# Patient Record
Sex: Female | Born: 1960
Health system: Southern US, Community
[De-identification: ages and names within clinical notes are randomized; demographics above are authoritative.]

## PROBLEM LIST (undated history)

## (undated) DIAGNOSIS — IMO0002 Reserved for concepts with insufficient information to code with codable children: Secondary | ICD-10-CM

## (undated) DIAGNOSIS — K5090 Crohn's disease, unspecified, without complications: Secondary | ICD-10-CM

## (undated) DIAGNOSIS — M1990 Unspecified osteoarthritis, unspecified site: Secondary | ICD-10-CM

## (undated) DIAGNOSIS — I10 Essential (primary) hypertension: Secondary | ICD-10-CM

## (undated) DIAGNOSIS — G4730 Sleep apnea, unspecified: Secondary | ICD-10-CM

## (undated) DIAGNOSIS — G5600 Carpal tunnel syndrome, unspecified upper limb: Secondary | ICD-10-CM

## (undated) DIAGNOSIS — E785 Hyperlipidemia, unspecified: Secondary | ICD-10-CM

## (undated) DIAGNOSIS — K219 Gastro-esophageal reflux disease without esophagitis: Secondary | ICD-10-CM

## (undated) DIAGNOSIS — F419 Anxiety disorder, unspecified: Secondary | ICD-10-CM

## (undated) DIAGNOSIS — H5440 Blindness, one eye, unspecified eye: Secondary | ICD-10-CM

## (undated) DIAGNOSIS — M797 Fibromyalgia: Secondary | ICD-10-CM

## (undated) DIAGNOSIS — G7000 Myasthenia gravis without (acute) exacerbation: Secondary | ICD-10-CM

## (undated) DIAGNOSIS — K589 Irritable bowel syndrome without diarrhea: Secondary | ICD-10-CM

## (undated) DIAGNOSIS — J189 Pneumonia, unspecified organism: Secondary | ICD-10-CM

## (undated) DIAGNOSIS — D72829 Elevated white blood cell count, unspecified: Secondary | ICD-10-CM

## (undated) DIAGNOSIS — R011 Cardiac murmur, unspecified: Secondary | ICD-10-CM

## (undated) DIAGNOSIS — Z9689 Presence of other specified functional implants: Secondary | ICD-10-CM

## (undated) DIAGNOSIS — C801 Malignant (primary) neoplasm, unspecified: Secondary | ICD-10-CM

## (undated) HISTORY — DX: Elevated white blood cell count, unspecified: D72.829

## (undated) HISTORY — PX: CARPAL TUNNEL RELEASE: SHX101

## (undated) HISTORY — DX: Reserved for concepts with insufficient information to code with codable children: IMO0002

## (undated) HISTORY — DX: Fibromyalgia: M79.7

## (undated) HISTORY — PX: THYMECTOMY: SHX1063

## (undated) HISTORY — DX: Malignant (primary) neoplasm, unspecified: C80.1

## (undated) HISTORY — PX: CHOLECYSTECTOMY: SHX55

## (undated) HISTORY — PX: BACK SURGERY: SHX140

## (undated) HISTORY — PX: MELANOMA EXCISION: SHX5266

## (undated) HISTORY — DX: Carpal tunnel syndrome, unspecified upper limb: G56.00

## (undated) HISTORY — DX: Essential (primary) hypertension: I10

## (undated) HISTORY — PX: BREAST REDUCTION SURGERY: SHX8

## (undated) HISTORY — PX: NASAL SINUS SURGERY: SHX719

## (undated) HISTORY — PX: ENDOMETRIAL ABLATION W/ NOVASURE: SUR434

## (undated) HISTORY — DX: Hyperlipidemia, unspecified: E78.5

## (undated) HISTORY — PX: OTHER SURGICAL HISTORY: SHX169

---

## 1998-01-29 ENCOUNTER — Encounter: Admission: RE | Admit: 1998-01-29 | Discharge: 1998-04-29 | Payer: Self-pay | Admitting: Internal Medicine

## 1999-04-18 ENCOUNTER — Encounter (INDEPENDENT_AMBULATORY_CARE_PROVIDER_SITE_OTHER): Payer: Self-pay

## 1999-04-18 ENCOUNTER — Other Ambulatory Visit: Admission: RE | Admit: 1999-04-18 | Discharge: 1999-04-18 | Payer: Self-pay | Admitting: Radiology

## 1999-04-26 ENCOUNTER — Other Ambulatory Visit: Admission: RE | Admit: 1999-04-26 | Discharge: 1999-04-26 | Payer: Self-pay | Admitting: Gynecology

## 1999-07-07 ENCOUNTER — Encounter: Payer: Self-pay | Admitting: Internal Medicine

## 1999-07-07 ENCOUNTER — Encounter: Admission: RE | Admit: 1999-07-07 | Discharge: 1999-07-07 | Payer: Self-pay | Admitting: Internal Medicine

## 1999-09-10 ENCOUNTER — Encounter: Payer: Self-pay | Admitting: Emergency Medicine

## 1999-09-10 ENCOUNTER — Encounter (INDEPENDENT_AMBULATORY_CARE_PROVIDER_SITE_OTHER): Payer: Self-pay | Admitting: *Deleted

## 1999-09-11 ENCOUNTER — Encounter: Payer: Self-pay | Admitting: Emergency Medicine

## 1999-09-11 ENCOUNTER — Inpatient Hospital Stay (HOSPITAL_COMMUNITY): Admission: EM | Admit: 1999-09-11 | Discharge: 1999-09-14 | Payer: Self-pay | Admitting: Emergency Medicine

## 1999-09-13 ENCOUNTER — Encounter: Payer: Self-pay | Admitting: Internal Medicine

## 1999-11-30 ENCOUNTER — Ambulatory Visit (HOSPITAL_COMMUNITY): Admission: RE | Admit: 1999-11-30 | Discharge: 1999-11-30 | Payer: Self-pay | Admitting: Gastroenterology

## 2000-05-31 ENCOUNTER — Encounter (INDEPENDENT_AMBULATORY_CARE_PROVIDER_SITE_OTHER): Payer: Self-pay | Admitting: Specialist

## 2000-05-31 ENCOUNTER — Other Ambulatory Visit: Admission: RE | Admit: 2000-05-31 | Discharge: 2000-05-31 | Payer: Self-pay | Admitting: Plastic Surgery

## 2000-06-07 ENCOUNTER — Other Ambulatory Visit: Admission: RE | Admit: 2000-06-07 | Discharge: 2000-06-07 | Payer: Self-pay | Admitting: Gynecology

## 2000-08-17 ENCOUNTER — Inpatient Hospital Stay (HOSPITAL_COMMUNITY): Admission: RE | Admit: 2000-08-17 | Discharge: 2000-08-20 | Payer: Self-pay | Admitting: Internal Medicine

## 2000-08-17 ENCOUNTER — Encounter: Payer: Self-pay | Admitting: Internal Medicine

## 2000-08-18 ENCOUNTER — Encounter: Payer: Self-pay | Admitting: Internal Medicine

## 2000-08-18 ENCOUNTER — Encounter: Payer: Self-pay | Admitting: Geriatric Medicine

## 2000-09-07 ENCOUNTER — Encounter (INDEPENDENT_AMBULATORY_CARE_PROVIDER_SITE_OTHER): Payer: Self-pay | Admitting: *Deleted

## 2000-09-07 ENCOUNTER — Ambulatory Visit (HOSPITAL_COMMUNITY): Admission: RE | Admit: 2000-09-07 | Discharge: 2000-09-07 | Payer: Self-pay | Admitting: Gastroenterology

## 2000-09-25 ENCOUNTER — Ambulatory Visit (HOSPITAL_COMMUNITY): Admission: RE | Admit: 2000-09-25 | Discharge: 2000-09-25 | Payer: Self-pay | Admitting: General Surgery

## 2000-09-25 ENCOUNTER — Encounter: Payer: Self-pay | Admitting: General Surgery

## 2000-10-09 ENCOUNTER — Ambulatory Visit (HOSPITAL_BASED_OUTPATIENT_CLINIC_OR_DEPARTMENT_OTHER): Admission: RE | Admit: 2000-10-09 | Discharge: 2000-10-10 | Payer: Self-pay | Admitting: Plastic Surgery

## 2000-10-09 ENCOUNTER — Encounter (INDEPENDENT_AMBULATORY_CARE_PROVIDER_SITE_OTHER): Payer: Self-pay | Admitting: Specialist

## 2000-10-26 ENCOUNTER — Encounter: Payer: Self-pay | Admitting: General Surgery

## 2000-11-06 ENCOUNTER — Ambulatory Visit (HOSPITAL_COMMUNITY): Admission: RE | Admit: 2000-11-06 | Discharge: 2000-11-06 | Payer: Self-pay | Admitting: General Surgery

## 2000-11-06 ENCOUNTER — Encounter (INDEPENDENT_AMBULATORY_CARE_PROVIDER_SITE_OTHER): Payer: Self-pay

## 2001-03-06 ENCOUNTER — Encounter: Payer: Self-pay | Admitting: Internal Medicine

## 2001-03-06 ENCOUNTER — Encounter: Admission: RE | Admit: 2001-03-06 | Discharge: 2001-03-06 | Payer: Self-pay | Admitting: Internal Medicine

## 2001-04-03 ENCOUNTER — Encounter: Payer: Self-pay | Admitting: Internal Medicine

## 2001-04-03 ENCOUNTER — Encounter: Admission: RE | Admit: 2001-04-03 | Discharge: 2001-04-03 | Payer: Self-pay | Admitting: Internal Medicine

## 2001-04-22 ENCOUNTER — Ambulatory Visit (HOSPITAL_COMMUNITY): Admission: RE | Admit: 2001-04-22 | Discharge: 2001-04-23 | Payer: Self-pay | Admitting: Otolaryngology

## 2001-04-22 ENCOUNTER — Encounter (INDEPENDENT_AMBULATORY_CARE_PROVIDER_SITE_OTHER): Payer: Self-pay | Admitting: *Deleted

## 2001-05-07 ENCOUNTER — Encounter (INDEPENDENT_AMBULATORY_CARE_PROVIDER_SITE_OTHER): Payer: Self-pay

## 2001-05-07 ENCOUNTER — Encounter: Payer: Self-pay | Admitting: Gastroenterology

## 2001-05-07 ENCOUNTER — Inpatient Hospital Stay (HOSPITAL_COMMUNITY): Admission: EM | Admit: 2001-05-07 | Discharge: 2001-05-13 | Payer: Self-pay

## 2001-05-08 ENCOUNTER — Encounter: Payer: Self-pay | Admitting: Internal Medicine

## 2001-05-08 ENCOUNTER — Encounter: Payer: Self-pay | Admitting: Gastroenterology

## 2001-06-05 ENCOUNTER — Other Ambulatory Visit: Admission: RE | Admit: 2001-06-05 | Discharge: 2001-06-05 | Payer: Self-pay | Admitting: Gynecology

## 2002-06-30 ENCOUNTER — Other Ambulatory Visit: Admission: RE | Admit: 2002-06-30 | Discharge: 2002-06-30 | Payer: Self-pay | Admitting: Gynecology

## 2002-10-14 ENCOUNTER — Encounter: Admission: RE | Admit: 2002-10-14 | Discharge: 2002-10-14 | Payer: Self-pay | Admitting: Internal Medicine

## 2002-10-14 ENCOUNTER — Encounter: Payer: Self-pay | Admitting: Internal Medicine

## 2002-11-21 ENCOUNTER — Encounter: Admission: RE | Admit: 2002-11-21 | Discharge: 2002-11-21 | Payer: Self-pay | Admitting: Otolaryngology

## 2002-11-21 ENCOUNTER — Encounter: Payer: Self-pay | Admitting: Otolaryngology

## 2002-11-25 ENCOUNTER — Encounter: Payer: Self-pay | Admitting: *Deleted

## 2002-11-25 ENCOUNTER — Inpatient Hospital Stay (HOSPITAL_COMMUNITY): Admission: AD | Admit: 2002-11-25 | Discharge: 2002-12-01 | Payer: Self-pay | Admitting: *Deleted

## 2002-11-26 ENCOUNTER — Encounter (INDEPENDENT_AMBULATORY_CARE_PROVIDER_SITE_OTHER): Payer: Self-pay | Admitting: *Deleted

## 2002-11-28 ENCOUNTER — Encounter (INDEPENDENT_AMBULATORY_CARE_PROVIDER_SITE_OTHER): Payer: Self-pay | Admitting: Specialist

## 2002-11-28 ENCOUNTER — Encounter: Payer: Self-pay | Admitting: *Deleted

## 2003-07-21 ENCOUNTER — Other Ambulatory Visit: Admission: RE | Admit: 2003-07-21 | Discharge: 2003-07-21 | Payer: Self-pay | Admitting: Gynecology

## 2003-08-04 ENCOUNTER — Ambulatory Visit (HOSPITAL_COMMUNITY): Admission: RE | Admit: 2003-08-04 | Discharge: 2003-08-04 | Payer: Self-pay | Admitting: General Surgery

## 2003-08-04 ENCOUNTER — Encounter (INDEPENDENT_AMBULATORY_CARE_PROVIDER_SITE_OTHER): Payer: Self-pay | Admitting: Specialist

## 2003-11-12 ENCOUNTER — Ambulatory Visit (HOSPITAL_COMMUNITY): Admission: RE | Admit: 2003-11-12 | Discharge: 2003-11-12 | Payer: Self-pay | Admitting: *Deleted

## 2004-03-02 ENCOUNTER — Ambulatory Visit: Payer: Self-pay | Admitting: Oncology

## 2004-05-18 ENCOUNTER — Encounter: Admission: RE | Admit: 2004-05-18 | Discharge: 2004-05-18 | Payer: Self-pay | Admitting: Internal Medicine

## 2004-06-13 ENCOUNTER — Ambulatory Visit: Payer: Self-pay | Admitting: Oncology

## 2004-07-22 ENCOUNTER — Ambulatory Visit: Payer: Self-pay | Admitting: Oncology

## 2004-07-22 ENCOUNTER — Ambulatory Visit (HOSPITAL_COMMUNITY): Admission: RE | Admit: 2004-07-22 | Discharge: 2004-07-22 | Payer: Self-pay | Admitting: Oncology

## 2004-07-22 ENCOUNTER — Encounter (INDEPENDENT_AMBULATORY_CARE_PROVIDER_SITE_OTHER): Payer: Self-pay | Admitting: Specialist

## 2004-08-31 ENCOUNTER — Other Ambulatory Visit: Admission: RE | Admit: 2004-08-31 | Discharge: 2004-08-31 | Payer: Self-pay | Admitting: Gynecology

## 2004-09-12 ENCOUNTER — Ambulatory Visit: Payer: Self-pay | Admitting: Oncology

## 2004-11-14 ENCOUNTER — Emergency Department (HOSPITAL_COMMUNITY): Admission: EM | Admit: 2004-11-14 | Discharge: 2004-11-14 | Payer: Self-pay | Admitting: Emergency Medicine

## 2005-03-31 ENCOUNTER — Ambulatory Visit: Payer: Self-pay | Admitting: Oncology

## 2005-07-19 ENCOUNTER — Ambulatory Visit: Payer: Self-pay | Admitting: Internal Medicine

## 2005-09-25 ENCOUNTER — Other Ambulatory Visit: Admission: RE | Admit: 2005-09-25 | Discharge: 2005-09-25 | Payer: Self-pay | Admitting: Gynecology

## 2005-10-04 ENCOUNTER — Ambulatory Visit: Payer: Self-pay | Admitting: Oncology

## 2005-10-09 LAB — COMPREHENSIVE METABOLIC PANEL
ALT: 13 U/L (ref 0–40)
AST: 12 U/L (ref 0–37)
Alkaline Phosphatase: 42 U/L (ref 39–117)
Sodium: 142 mEq/L (ref 135–145)
Total Bilirubin: 0.6 mg/dL (ref 0.3–1.2)
Total Protein: 7 g/dL (ref 6.0–8.3)

## 2005-10-09 LAB — CBC WITH DIFFERENTIAL/PLATELET
EOS%: 0.7 % (ref 0.0–7.0)
MCH: 33.4 pg (ref 26.0–34.0)
MCV: 94.1 fL (ref 81.0–101.0)
MONO%: 5.3 % (ref 0.0–13.0)
NEUT#: 13.2 10*3/uL — ABNORMAL HIGH (ref 1.5–6.5)
RBC: 4.36 10*6/uL (ref 3.70–5.32)
RDW: 14.3 % (ref 11.3–14.5)

## 2005-10-09 LAB — CHCC SMEAR

## 2005-10-19 ENCOUNTER — Ambulatory Visit: Payer: Self-pay | Admitting: Internal Medicine

## 2005-11-06 ENCOUNTER — Ambulatory Visit: Payer: Self-pay | Admitting: Internal Medicine

## 2005-11-27 ENCOUNTER — Ambulatory Visit (HOSPITAL_COMMUNITY): Admission: RE | Admit: 2005-11-27 | Discharge: 2005-11-27 | Payer: Self-pay | Admitting: Gastroenterology

## 2006-03-27 ENCOUNTER — Encounter: Admission: RE | Admit: 2006-03-27 | Discharge: 2006-03-27 | Payer: Self-pay | Admitting: Gastroenterology

## 2006-04-04 ENCOUNTER — Ambulatory Visit: Payer: Self-pay | Admitting: Oncology

## 2006-04-09 LAB — CBC WITH DIFFERENTIAL/PLATELET
Basophils Absolute: 0 10*3/uL (ref 0.0–0.1)
Eosinophils Absolute: 0.2 10*3/uL (ref 0.0–0.5)
HCT: 43.3 % (ref 34.8–46.6)
HGB: 14.5 g/dL (ref 11.6–15.9)
LYMPH%: 7.8 % — ABNORMAL LOW (ref 14.0–48.0)
MCV: 92.6 fL (ref 81.0–101.0)
MONO#: 0.7 10*3/uL (ref 0.1–0.9)
NEUT#: 12.2 10*3/uL — ABNORMAL HIGH (ref 1.5–6.5)
NEUT%: 85.7 % — ABNORMAL HIGH (ref 39.6–76.8)
Platelets: 313 10*3/uL (ref 145–400)
RBC: 4.67 10*6/uL (ref 3.70–5.32)
WBC: 14.2 10*3/uL — ABNORMAL HIGH (ref 3.9–10.0)

## 2006-04-09 LAB — COMPREHENSIVE METABOLIC PANEL
BUN: 17 mg/dL (ref 6–23)
CO2: 20 mEq/L (ref 19–32)
Glucose, Bld: 127 mg/dL — ABNORMAL HIGH (ref 70–99)
Sodium: 140 mEq/L (ref 135–145)
Total Bilirubin: 0.4 mg/dL (ref 0.3–1.2)
Total Protein: 7.1 g/dL (ref 6.0–8.3)

## 2006-04-09 LAB — LACTATE DEHYDROGENASE: LDH: 156 U/L (ref 94–250)

## 2006-10-04 ENCOUNTER — Ambulatory Visit: Payer: Self-pay | Admitting: Oncology

## 2006-10-10 LAB — CBC WITH DIFFERENTIAL/PLATELET
BASO%: 0.3 % (ref 0.0–2.0)
HCT: UNDETERMINED % (ref 34.8–46.6)
MCHC: UNDETERMINED g/dL (ref 32.0–36.0)
MONO#: 1 10*3/uL — ABNORMAL HIGH (ref 0.1–0.9)
NEUT#: 12.5 10*3/uL — ABNORMAL HIGH (ref 1.5–6.5)
RBC: UNDETERMINED 10*6/uL (ref 3.70–5.32)
WBC: 15.3 10*3/uL — ABNORMAL HIGH (ref 3.9–10.0)
lymph#: 1.6 10*3/uL (ref 0.9–3.3)

## 2006-10-15 ENCOUNTER — Other Ambulatory Visit: Admission: RE | Admit: 2006-10-15 | Discharge: 2006-10-15 | Payer: Self-pay | Admitting: Gynecology

## 2006-10-23 ENCOUNTER — Encounter: Admission: RE | Admit: 2006-10-23 | Discharge: 2006-10-23 | Payer: Self-pay | Admitting: Gastroenterology

## 2007-02-20 ENCOUNTER — Encounter: Admission: RE | Admit: 2007-02-20 | Discharge: 2007-02-20 | Payer: Self-pay | Admitting: Gastroenterology

## 2007-04-20 ENCOUNTER — Encounter: Payer: Self-pay | Admitting: Internal Medicine

## 2007-04-20 DIAGNOSIS — J45909 Unspecified asthma, uncomplicated: Secondary | ICD-10-CM | POA: Insufficient documentation

## 2007-04-20 DIAGNOSIS — I10 Essential (primary) hypertension: Secondary | ICD-10-CM | POA: Insufficient documentation

## 2007-04-20 DIAGNOSIS — E785 Hyperlipidemia, unspecified: Secondary | ICD-10-CM | POA: Insufficient documentation

## 2007-04-20 DIAGNOSIS — Z8669 Personal history of other diseases of the nervous system and sense organs: Secondary | ICD-10-CM | POA: Insufficient documentation

## 2007-08-26 ENCOUNTER — Ambulatory Visit: Payer: Self-pay | Admitting: Oncology

## 2007-08-29 LAB — CBC WITH DIFFERENTIAL/PLATELET
BASO%: 0.2 % (ref 0.0–2.0)
LYMPH%: 5.7 % — ABNORMAL LOW (ref 14.0–48.0)
MCHC: 36.5 g/dL — ABNORMAL HIGH (ref 32.0–36.0)
MONO#: 0.6 10*3/uL (ref 0.1–0.9)
Platelets: 361 10*3/uL (ref 145–400)
RBC: 4.61 10*6/uL (ref 3.70–5.32)
RDW: 12.1 % (ref 11.3–14.5)
WBC: 21.1 10*3/uL — ABNORMAL HIGH (ref 3.9–10.0)

## 2007-08-29 LAB — COMPREHENSIVE METABOLIC PANEL
ALT: 12 U/L (ref 0–35)
AST: 14 U/L (ref 0–37)
Albumin: 4.4 g/dL (ref 3.5–5.2)
BUN: 11 mg/dL (ref 6–23)
CO2: 23 mEq/L (ref 19–32)
Calcium: 9.3 mg/dL (ref 8.4–10.5)
Chloride: 102 mEq/L (ref 96–112)
Potassium: 3.8 mEq/L (ref 3.5–5.3)

## 2007-08-29 LAB — LACTATE DEHYDROGENASE: LDH: 196 U/L (ref 94–250)

## 2007-08-29 LAB — ERYTHROCYTE SEDIMENTATION RATE: Sed Rate: 38 mm/hr — ABNORMAL HIGH (ref 0–30)

## 2007-11-15 ENCOUNTER — Encounter: Admission: RE | Admit: 2007-11-15 | Discharge: 2007-11-15 | Payer: Self-pay | Admitting: Gastroenterology

## 2007-11-16 ENCOUNTER — Inpatient Hospital Stay (HOSPITAL_COMMUNITY): Admission: EM | Admit: 2007-11-16 | Discharge: 2007-11-19 | Payer: Self-pay | Admitting: Emergency Medicine

## 2007-11-29 ENCOUNTER — Ambulatory Visit (HOSPITAL_COMMUNITY): Admission: RE | Admit: 2007-11-29 | Discharge: 2007-11-29 | Payer: Self-pay | Admitting: Urology

## 2008-08-25 ENCOUNTER — Ambulatory Visit: Payer: Self-pay | Admitting: Oncology

## 2008-10-01 ENCOUNTER — Ambulatory Visit: Payer: Self-pay | Admitting: Oncology

## 2008-10-05 LAB — COMPREHENSIVE METABOLIC PANEL
Albumin: 4.3 g/dL (ref 3.5–5.2)
CO2: 26 mEq/L (ref 19–32)
Calcium: 9.5 mg/dL (ref 8.4–10.5)
Chloride: 98 mEq/L (ref 96–112)
Glucose, Bld: 113 mg/dL — ABNORMAL HIGH (ref 70–99)
Potassium: 3.9 mEq/L (ref 3.5–5.3)
Sodium: 134 mEq/L — ABNORMAL LOW (ref 135–145)
Total Protein: 7 g/dL (ref 6.0–8.3)

## 2008-10-05 LAB — CBC WITH DIFFERENTIAL/PLATELET
Basophils Absolute: 0 10*3/uL (ref 0.0–0.1)
Eosinophils Absolute: 0 10*3/uL (ref 0.0–0.5)
HCT: 38.9 % (ref 34.8–46.6)
HGB: 13.8 g/dL (ref 11.6–15.9)
NEUT#: 11.2 10*3/uL — ABNORMAL HIGH (ref 1.5–6.5)
NEUT%: 91.9 % — ABNORMAL HIGH (ref 38.4–76.8)
RDW: 14.1 % (ref 11.2–14.5)
lymph#: 0.6 10*3/uL — ABNORMAL LOW (ref 0.9–3.3)

## 2008-10-05 LAB — LACTATE DEHYDROGENASE: LDH: 181 U/L (ref 94–250)

## 2008-11-09 ENCOUNTER — Ambulatory Visit (HOSPITAL_COMMUNITY): Admission: RE | Admit: 2008-11-09 | Discharge: 2008-11-09 | Payer: Self-pay | Admitting: Obstetrics & Gynecology

## 2008-11-09 ENCOUNTER — Encounter (INDEPENDENT_AMBULATORY_CARE_PROVIDER_SITE_OTHER): Payer: Self-pay | Admitting: Obstetrics & Gynecology

## 2009-10-01 ENCOUNTER — Ambulatory Visit: Payer: Self-pay | Admitting: Oncology

## 2010-02-24 ENCOUNTER — Ambulatory Visit: Payer: Self-pay | Admitting: Oncology

## 2010-02-28 LAB — CBC WITH DIFFERENTIAL/PLATELET
BASO%: 0.2 % (ref 0.0–2.0)
Eosinophils Absolute: 0.3 10*3/uL (ref 0.0–0.5)
HCT: 41.9 % (ref 34.8–46.6)
HGB: 15 g/dL (ref 11.6–15.9)
MCHC: 35.8 g/dL (ref 31.5–36.0)
MONO#: 0.7 10*3/uL (ref 0.1–0.9)
NEUT%: 84.3 % — ABNORMAL HIGH (ref 38.4–76.8)
Platelets: 233 10*3/uL (ref 145–400)
lymph#: 1.2 10*3/uL (ref 0.9–3.3)

## 2010-03-01 LAB — COMPREHENSIVE METABOLIC PANEL
AST: 12 U/L (ref 0–37)
Albumin: 4.2 g/dL (ref 3.5–5.2)
BUN: 18 mg/dL (ref 6–23)
Chloride: 104 mEq/L (ref 96–112)
Creatinine, Ser: 0.76 mg/dL (ref 0.40–1.20)
Total Bilirubin: 0.6 mg/dL (ref 0.3–1.2)

## 2010-03-01 LAB — SEDIMENTATION RATE

## 2010-06-25 LAB — BASIC METABOLIC PANEL
Chloride: 102 mEq/L (ref 96–112)
GFR calc Af Amer: >60 mL/min (ref >60)
Potassium: 3.8 mEq/L (ref 3.5–5.1)
Sodium: 138 mEq/L (ref 135–145)

## 2010-06-25 LAB — CBC
MCHC: 35 g/dL (ref 30.0–36.0)
MCHC: 35.1 g/dL (ref 30.0–36.0)
MCV: 92.7 fL (ref 78.0–100.0)
MCV: 93.3 fL (ref 78.0–100.0)
Platelets: 288 10*3/uL (ref 150–400)
RBC: 3.96 MIL/uL (ref 3.87–5.11)
RDW: 13.6 % (ref 11.5–15.5)
WBC: 17.2 10*3/uL — ABNORMAL HIGH (ref 4.0–10.5)

## 2010-08-02 NOTE — Discharge Summary (Signed)
[name], Teresa Lee NO.:  [id number]   MEDICAL RECORD NO.:  [id number]          PATIENT TYPE:  INP   LOCATION:  5625                         FACILITY:  [facility]   PHYSICIAN:  Lear Ng, MDDATE OF BIRTH:  [birth year]   DATE OF ADMISSION:  11/16/2007  DATE OF DISCHARGE:  11/19/2007                               DISCHARGE SUMMARY   DISCHARGE DIAGNOSES:  1. Viral gastroenteritis.  2. Mesenteric adenitis secondary to viral gastroenteritis.  3. Abdominal pain and diarrhea secondary to viral gastroenteritis.  4. History of Crohn disease.  5. Irritable bowel syndrome.  6. History of myasthenia gravis.  7. Chronic prednisone use secondary to myasthenia gravis.   DISCHARGE MEDICINES:  1. Prednisone 40 mg p.o. daily.  2. Cipro 500 mg p.o. b.i.d. x10 days.  3. All other medicines to continue as previously dosed prior to      hospitalization.   DISCHARGE DIET:  No restrictions.   DISCHARGE ACTIVITY:  Increase activity slowly.   DISCHARGE FOLLOWUP:  1. Follow up with Dr. Wilford Corner at [facility] [place] on December 02, 2007, at 1 o'clock.  2. Follow up with Dr. Gaynelle Arabian at [facility] on November 26, 2007, at 9 a.m.   HOSPITAL COURSE:  [name] is a 50 year old white female who was  admitted on November 16, 2007, secondary to worsened abdominal pain and  persistent watery diarrhea.  She failed to respond to empiric Flagyl  that was given 1 day prior to admission.  During her hospitalization,  she underwent a CT scan, which showed possible mesenteric adenitis.  No  strictures or bowel wall inflammation was seen.  She was given  supportive care with IV fluids and given IV antibiotics due to urinary  tract infection.  Her stool studies were negative for C. diff x2.  Her  fecal lactoferrin was positive.  She did have a UTI noted on admission  and was placed on IV Cipro for that.  Her urine culture had 8000  colonies and was insignificant growth.   A repeat urinalysis was done on  the day of discharge.  Her abdominal pain subsided each day and her  diarrhea lessened.  On the day of discharge, she was denying any pain,  was having loose stools, but they were becoming more formed stools.  She  was tolerating a regular diet and was ready to go home.  She was stable  for discharge on November 19, 2007, and discharged as stated above.  Initially, she was put on IV Solu-Medrol for 2 days and then changed to  over to prednisone 40 mg a day.  Upon review of her outside Neurology  note, it reports that she is on 10 mg once a day of prednisone, although  she said that she is on every other day, and we will contact Neurology  to clarify this dosage.  We will taper the prednisone as an outpatient.   DISCHARGE CONDITION:  Improved.   DISCHARGE INSTRUCTIONS:  The patient is instructed to call our office if  abdominal  pain returns or if her diarrhea recurs.      Lear Ng, MD  Electronically Signed     VCS/MEDQ  D:  11/19/2007  T:  11/20/2007  Job:  [id number]   cc:   Pierre Bali I. Gaynelle Arabian, M.D.  Tommy Medal, M.D.  Vern Leandrew Koyanagi, M.D.

## 2010-08-02 NOTE — H&P (Signed)
NAMELAURELL, COALSON NO.:  [id number]   MEDICAL RECORD NO.:  [id number]          PATIENT TYPE:  INP   LOCATION:  1448                         FACILITY:  [facility]   PHYSICIAN:  Lear Ng, MDDATE OF BIRTH:  May 15, 1960   DATE OF ADMISSION:  11/16/2007  DATE OF DISCHARGE:                              HISTORY & PHYSICAL   REASON FOR ADMISSION:  1. Abdominal pain.  2. Diarrhea.   HISTORY OF PRESENT ILLNESS:  Ms. Brune is a 50 year old white female  who is established patient of mine and has known Crohn ileocolitis and  irritable bowel syndrome.  She is on Pentasa chronically for Crohn  ileocolitis.  She also has a history of myasthenia gravis and is on  chronic prednisone for that and CellCept.  She comes in with worsened  diarrhea, abdominal pain, and nausea.  This been going on for  approximately a week.  She was seen in the office by me on Friday for  these symptoms.  I gave her empiric Flagyl but the pain worsened and the  diarrhea persisted along with some weakness, and she came into emergency  room on November 16, 2007.  She does have a history of chronic  leukocytosis of unclear etiology, although it has been thought that her  chronic steroids are contributing to that.   PAST MEDICAL HISTORY:  1. Crohn ileocolitis.  2. Irritable bowel syndrome.  3. Myasthenia gravis.  4. Acid reflux.   MEDICATIONS:  Pentasa, prednisone, Aciphex, simvastatin, potassium  chloride, CellCept, meloxicam, Ambien, and Flagyl.   ALLERGIES:  SULFA drugs.   FAMILY HISTORY:  Noncontributory.   SOCIAL HISTORY:  Denies smoking or drugs, occasional alcohol.   REVIEW OF SYSTEMS:  Negative except as stated above.   PHYSICAL EXAMINATION:  VITAL SIGNS:  Temperature 98.1, pulse 96, blood  pressure 115/81, and O2 sat 99% on room air.  GENERAL:  Alert and in no acute distress.  CARDIOVASCULAR:  Regular rate and rhythm.  CHEST:  Clear to auscultation bilaterally.  ABDOMEN:   Periumbilical tenderness, soft, nondistended, positive bowel  sounds, and guarding minimally noted in the periumbilical area.   LABORATORY:  White blood count 24,000, hemoglobin 15, and platelet count  298,000.  Potassium 2.8.  Lipase 14.  Urine hCG negative.  Urinalysis  showed large blood, cloudy, 100 protein, positive nitrites, moderate  leukocytes, 11 to 20 white blood cells, and many bacteria.   Abdominopelvic CT scan preliminary reading showed mesenteric adenitis,  no strictures seen.   IMPRESSION:  A 50 year old white female with known Crohn ileocolitis and  irritable bowel syndrome presenting with 1 week of diarrhea, abdominal  pain, and nausea that worsened on November 16, 2007.  Her abdominopelvic  CT scan shows mesenteric adenitis without any focal strictures in her  gut.  She could be having a viral gastroenteritis versus bacterial  gastroenteritis.  She does have urinary tract infection that will be  treated with IV antibiotics.  We will put her on clear liquid diet and  aggressive IV fluids.  We will do IV Cipro for UTI.  We will do  IV  steroids empirically for Crohn disease.  Due to chronic prednisone use,  we will need to check stool studies and follow her labs closely.  Also  we will be replacing her potassium.      Lear Ng, MD  Electronically Signed     VCS/MEDQ  D:  11/17/2007  T:  11/17/2007  Job:  [id number]   cc:   Tommy Medal, M.D.

## 2010-08-02 NOTE — Op Note (Signed)
NAMEJANELE, Teresa Lee NO.:  [id number]   MEDICAL RECORD NO.:  [id number]          PATIENT TYPE:  OIB   LOCATION:  7858                          FACILITY:  [facility]   PHYSICIAN:  Maisie Fus, M.D.   DATE OF BIRTH:  02/22/1961   DATE OF PROCEDURE:  11/09/2008  DATE OF DISCHARGE:                               OPERATIVE REPORT   PREOPERATIVE DIAGNOSIS:  Menorrhagia.   POSTOPERATIVE DIAGNOSIS:  Menorrhagia.   OPERATIVE PROCEDURE:  Hysteroscopy, dilation and curettage, and Novasure  endometrial ablation.   INTRAOPERATIVE COMPLICATIONS:  Possible uterine perforation, not  resulting in postoperative complication after 6 hours of postoperative  observation.   SURGEON:  Maisie Fus, M.D.   ANESTHESIA:  General.   ESTIMATED INTRAOPERATIVE BLOOD LOSS:  10 mL.   LACTATED RINGER'S DEFICIT:  250 mL.   The patient is a 50 year old with very heavy periods.  She had  sonohysterogram showing no significant abnormality of the uterus.  She  is admitted now for hysteroscopy, dilation and curettage and Novasure  endometrial ablation.  She was admitted on the morning of surgery.  She  was given a bolus of clindamycin she was brought to the operating room,  placed under adequate general anesthesia, placed in dorsal lithotomy  position using Allen stirrup system.  Betadine prep of mons, perineum  and vagina was carried out in the usual fashion.  Sterile drapes were  applied.  Bivalve speculum was introduced and cervix visualized.  Single  toothed tenaculum was used to grasp the cervix on the anterior lip.  Cervix sounded to 4 cm.  Uterus sounded to 8.  Cervix was progressively  dilated to 25 with [facility] dilators.  The hysteroscope was introduced and  inspection of the cavity revealed no apparent abnormality.  There may  have been a suggestion of a small polyp in the lower segment.  Gentle  thorough curettage was carried out.  The Novasure device was then loaded  in the standard  fashion without difficulty and the test was successful.  The ablation took 2 minutes and 2 seconds.  On attempt to revisualize  the endometrial cavity.  There was a suggestion that perhaps the scope  passed through the anterior midline of the uterus and since the uterus  was acutely retroverted.  No intestinal contents were visualized with  the lactated Ringer's deficit rapidly increased.  At this point the  procedure was terminated.  Given the adequate application it is presumed  that complete endometrial ablation was accomplished.  The patient was  observed until approximately 1:15 which is approximately 5-6 hours after  surgery.  A repeat hemoglobin was good at 12.9.  The abdomen was soft  and nontender.  The patient was having appropriate menstrual like  cramping.  She was alert, oriented she was tolerating regular food.  She  was voiding without difficulty.  It was elected to discharge her home  with instructions to return to the hospital immediately for  generalized abdominal pain, for heavy bleeding with clots, for fever or  for syncopal episodes.  She is to be followed up  in the office in  approximately 1 week.  Otherwise, she is given routine post operative  instructions for the ablation and dilation and curettage.      Maisie Fus, M.D.  Electronically Signed     WRN/MEDQ  D:  11/09/2008  T:  11/09/2008  Job:  [id number]

## 2010-08-05 NOTE — Consult Note (Signed)
[place]  Patient:    SERAFINA, TOPHAM Visit Number: [id number] MRN: [id number]          Service Type: EMS Location: [facility] Attending Physician:  Jetty Duhamel Dictated by:   Lillette Boxer. Dahlstedt, M.D. Proc. Date: 05/08/01 Admit Date:  05/07/2001   CC:         Leilani Merl, M.D.  Nelwyn Salisbury, M.D.   Consultation Report  REASON FOR CONSULTATION:  History of urinary tract infections.  BRIEF HISTORY:  A 50 year old female who has a remote history of vesicoureteral reflux.  She was seen by Phillips Grout, M.D. 30 years ago. She was followed conservatively for this reflux which was diagnosed upon evaluation for recurrent urinary tract infections.  She apparently outgrew this reflux and has not had frequent urinary tract infections since she was a toddler. There is no history of unexplained febrile illnesses.  She is admitted to the hospital for recurrent symptoms, crampy abdominal pain. This has been going on intermittently for approximately three years. She is status post several admissions both to [facility] and [facility].  She has been diagnosed with possible Crohns disease and is on Pentasa for this.  Despite being on this medication, she has had significant symptoms over the past year or so, having been treated at least twice in the hospital for this abdominal pain.  When she is in the hospital, she has been diagnosed with a urinary tract infection.  She apparently feels better after treatment with antibiotics after a couple of days. She has no history of cystitis or pyelonephritis when she is not in the hospital.  She denies any dysuria on a regular basis, frequency, pressured voiding, gross hematuria, pyuria, or pneumaturia.  She has had extensive evaluation including ERCP, laparoscopy, and colonoscopy.  Apparently, she is felt to have minimal evidence of endometriosis.  Her abdominal symptoms do  get a little bit worse during her cycle, mainly every other month.  The patient does complain about intermittent dark urine.  This is usually when she is fairly sick.  She does not note the pain to lateralize either on the right or left side.  When she does have the pain it is in her midabdoment, but does radiate to her back.  She has no relieving factors except for taking pain medicine.  Exacerbating factors include mainly taking in food or fluids.  PAST MEDICAL HISTORY:  Significant for having had a cholecystectomy in 1998. She is status post bilateral tubal ligation.  She has a history of myesthenia gravis and is status post thymectomy.  She has had a history of reduction mammoplasty.  She has a history of asthma as a child, but is not on medication.  MEDICATIONS: 1. Prednisone 5 mg daily. 2. Pentasa 250 mg three tablets in the morning, two at lunch, and three in    the evening.  PHYSICAL EXAMINATION:  GENERAL:  A pleasant, but somewhat uncomfortable adult female.  She had no CVA tenderness.  There were no flank masses.  SKIN:  She had no skin lesions on her trunk.  ABDOMEN:  Flat, soft, nondistended, with mild generalized tenderness which was mostly localized to the left epigastrium.  No hepatosplenomegaly or masses were noted.  Bladder was not palpable.  No rebound or guarding was present.  GENITOURINARY: PELVIC:  Not performed.  LABORATORY DATA:  BUN and creatinine were normal at 9 and 0.5, respectively. Total protein was normal, albumin 3.0.  Liver enzymes were normal.  Alkaline phosphatase was normal at 98.  Bilirubin was normal.  Urinalysis revealed color amber.  Specific gravity 1.040 (significantly concentrated).  The pH was normal.  No blood was noted.  She had nitrite positive, LE negative.  3 to 6 white cells, 0 to 2 red cells, rare bacteria. Urine culture as well as blood cultures were sent.  CT scan revealed normal kidneys, ureters, and bladder.  There was  thickening of the wall of the ileum with mild mesenteric adenopathy.  No air was seen in the bladder.  IMPRESSION: 1. History of reflux as a child, most likely resolved naturally.  No evidence    that this has persisted with no evidence of recurrent adult UTIs. 2. Rule out urinary tract infection at the present time. 3. History of sterile pyuria - remote possibility of TB with father having    had this (he died of it).  TB should be ruled out. 4. Doubt any abnormality such as intravesical fistula causing the patients    current symptoms.  PLAN: 1. Will ask for PPD to be placed. 2. No further evaluation of urinary tract needs to be done at this time. 3. Agree with urine culture and placing the patient on cipro. 4. I spoke with the patient and family regarding current urinary situation -    I will follow with you.  50 minutes spent face-to-face with this patient in consultation. Dictated by:   Lillette Boxer. Dahlstedt, M.D. Attending Physician:  Jetty Duhamel DD:  05/08/01 TD:  05/09/01 Job: [id number]

## 2010-08-05 NOTE — Assessment & Plan Note (Signed)
[place]                               PULMONARY OFFICE NOTE   NAME:GROGANMarcina, Kinnison                       MRN:          [id number]  DATE:11/06/2005                            DOB:          1960/05/26    HISTORY:  This is a 50 year old white female never smoker with a history of  myasthenia who returns for follow-up evaluation with PFTs to compare to  study she had in 1998 for the purpose of making sure she had no significant  remottling or any other chronic asthma issues.  She had told me previously  that she continued to have symptoms of dyspnea even when she increased  prednisone above her traditional floor of 5 mg per day (which she uses for  Crohn's and myasthenia).  I brought her back today to see if there was any  unaddressed air flow obstruction or reversibility and reviewed with her  goals of asthma treatment including the rule of twos which she seems to have  mastered well in that she rarely uses albuterol and denies any nocturnal  awakening or limitation in terms of daily activities due to dyspnea.  She  has not actually used her albuterol at all.   Medication reviewed with the patient in detail listed on the face sheet  dated November 06, 2005, noting it has been weeks since her last albuterol  treatment.   PHYSICAL EXAMINATION:  GENERAL APPEARANCE:  She is a pleasant ambulatory  white female who appears minimally cushingoid.  VITAL SIGNS:  She is afebrile with stable vital signs.  HEENT:  Unremarkable.  Pharynx clear.  LUNGS:  Lung fields are perfectly clear bilaterally to auscultation and  percussion.  CARDIOVASCULAR:  There is a regular rhythm without murmurs, rubs, or  gallops.  ABDOMEN:  Soft and benign.  EXTREMITIES:  Warm without calf tenderness, clubbing, cyanosis, or edema.   PFTs were reviewed from October 19, 2005, indicating that her FEV1 is  stabilized around 2 liters with no improvement after bronchodilators with a  pattern on flow volume that does not suggest significant airflow  obstruction, including any significant indication of small airways disease.  This spirometry is almost identical to the one she had in 1998.   IMPRESSION:  Minimal airflow obstruction, albeit while on prednisone at 5 mg  per day for myasthenia gravis and ulcerative colitis.  On this dose she has  met all the goals of asthma including maintaining normal activity, lack of  nocturnal exacerbations and minimal use of  albuterol.  In this setting, there is no need to be followed in the  pulmonary clinic, would certainly be happy to see her here on a p.r.n.  basis.                                  Christena Deem. Melvyn Novas, MD, [facility]   MBW/MedQ  DD:  11/06/2005 DT:  11/07/2005 Job #:  [id number]   cc:   Tommy Medal, MD

## 2010-08-05 NOTE — Procedures (Signed)
[place]  Patient:    Teresa Lee, Teresa Lee Visit Number: [id number] MRN: [id number]          Service Type: MED Location: [id number] Attending Physician:  Verner Chol Dictated by:   Nelwyn Salisbury, M.D. Proc. Date: 05/10/01 Admit Date:  05/07/2001   CC:         Leilani Merl, M.D.  Meriel Flavors, M.D.  Lillette Boxer. Dahlstedt, M.D.   Procedure Report  DATE OF BIRTH:  02-14-1961  PROCEDURE PERFORMED:  Colonoscopy with biopsies.  ENDOSCOPIST:  Nelwyn Salisbury, M.D.  INSTRUMENT USED:  Olympus video colonoscope.  INDICATIONS FOR PROCEDURE:  A 50 year old white female with a history of a possible Crohns history on a previously colonoscopy ________, and the biopsy showed possible __________ ulceration.  The patient has had recurrent problems with abdominal pain, somewhat loose stools in the recent past.  There is thickening of the terminal ileum seen on a CT scan done after hospitalization.  Therefore, colonoscopy is being done to rebiopsy the terminal ileum and further evaluate the patients colon.  PREPROCEDURE PREPARATION:  Informed consent was procured from the patient. The patient was prepped with two bottles of Fleets Phospho-Soda the night prior to the procedure, and fasted for eight hours prior to the procedure.  PREPROCEDURE PHYSICAL:  VITAL SIGNS:  Stable.  NECK:  Supple.  CHEST:  Clear to auscultation, S1 and S2 regular.  ABDOMEN:  Soft with normal bowel sounds.  There is epigastric and periumbilical tenderness on palpation with guarding.  DESCRIPTION OF PROCEDURE:  The patient was placed in the left lateral decubitus position, and sedated with an additional 10 mg of Demerol and 1.5 mg of Versed intravenously.  Once the patient was adequately sedated, maintained on low flow oxygen and continuous cardiac monitoring, the Olympus video colonoscope was advanced from the rectum to the cecum and terminal  ileum without difficulty.  There is patchy erythema in the terminal ileum with one erosion and it was biopsied to rule out Crohns disease.  There was abscess ulceration seen in the colon randomly with more prominent changes on the left side as compared to the right side.  Multiple abscess ulcerations were biopsied to rule out Crohns.  The intervening mucosa appeared healthy.  No masses or polyps were seen.  IMPRESSION: 1. Abscess ulcerations throughout the colon, more prominent on the left side. 2. Patchy erythema in the terminal ileum, biopsied for pathology. 3. One isolated erosion in the terminal ileum, also biopsied for pathology.  RECOMMENDATIONS: 1. Start Solu-Medrol today. 2. Await pathology results. 3. Advance diet as tolerated. Dictated by:   Nelwyn Salisbury, M.D. Attending Physician:  Verner Chol DD:  05/10/01 TD:  05/12/01 Job: [id number]

## 2010-08-16 ENCOUNTER — Other Ambulatory Visit: Payer: Self-pay | Admitting: Dermatology

## 2010-10-18 ENCOUNTER — Other Ambulatory Visit: Payer: Self-pay | Admitting: Internal Medicine

## 2010-10-18 DIAGNOSIS — R748 Abnormal levels of other serum enzymes: Secondary | ICD-10-CM

## 2010-10-20 ENCOUNTER — Ambulatory Visit
Admission: RE | Admit: 2010-10-20 | Discharge: 2010-10-20 | Disposition: A | Discharge status: Home / Self Care | Payer: Commercial Managed Care - PPO | Source: Ambulatory Visit | Attending: Internal Medicine | Admitting: Internal Medicine

## 2010-10-20 DIAGNOSIS — R748 Abnormal levels of other serum enzymes: Secondary | ICD-10-CM

## 2010-12-21 LAB — BASIC METABOLIC PANEL
Calcium: 8.4
GFR calc Af Amer: >60
GFR calc non Af Amer: >60
Glucose, Bld: 100 — ABNORMAL HIGH
Potassium: 3.6
Sodium: 140

## 2011-03-01 ENCOUNTER — Telehealth: Payer: Self-pay | Admitting: Oncology

## 2011-03-01 NOTE — Telephone Encounter (Signed)
S/w pt, gave appt 05/04/11 @ 9.30am r/s'd from 12/10 appt cx'd due to Epic.

## 2011-03-10 ENCOUNTER — Other Ambulatory Visit (HOSPITAL_COMMUNITY): Payer: Self-pay | Admitting: Orthopedic Surgery

## 2011-03-10 DIAGNOSIS — M5104 Intervertebral disc disorders with myelopathy, thoracic region: Secondary | ICD-10-CM

## 2011-03-17 ENCOUNTER — Ambulatory Visit (HOSPITAL_COMMUNITY)
Admission: RE | Admit: 2011-03-17 | Discharge: 2011-03-17 | Disposition: A | Discharge status: Home / Self Care | Payer: 59 | Source: Ambulatory Visit | Attending: Orthopedic Surgery | Admitting: Orthopedic Surgery

## 2011-03-17 DIAGNOSIS — M47812 Spondylosis without myelopathy or radiculopathy, cervical region: Secondary | ICD-10-CM | POA: Insufficient documentation

## 2011-03-17 DIAGNOSIS — M542 Cervicalgia: Secondary | ICD-10-CM | POA: Insufficient documentation

## 2011-03-17 DIAGNOSIS — M5104 Intervertebral disc disorders with myelopathy, thoracic region: Secondary | ICD-10-CM

## 2011-03-17 DIAGNOSIS — R209 Unspecified disturbances of skin sensation: Secondary | ICD-10-CM | POA: Insufficient documentation

## 2011-03-17 DIAGNOSIS — M79609 Pain in unspecified limb: Secondary | ICD-10-CM | POA: Insufficient documentation

## 2011-04-13 ENCOUNTER — Telehealth: Payer: Self-pay | Admitting: Oncology

## 2011-04-13 NOTE — Telephone Encounter (Signed)
spoke with pt ans she is aware of r/s from 2/14 to 3/7   aom

## 2011-05-04 ENCOUNTER — Other Ambulatory Visit: Payer: Self-pay | Admitting: Lab

## 2011-05-04 ENCOUNTER — Ambulatory Visit: Payer: Self-pay | Admitting: Oncology

## 2011-05-10 ENCOUNTER — Ambulatory Visit: Payer: 59 | Attending: Internal Medicine | Admitting: Physical Therapy

## 2011-05-10 DIAGNOSIS — IMO0001 Reserved for inherently not codable concepts without codable children: Secondary | ICD-10-CM | POA: Insufficient documentation

## 2011-05-10 DIAGNOSIS — R5381 Other malaise: Secondary | ICD-10-CM | POA: Insufficient documentation

## 2011-05-10 DIAGNOSIS — M542 Cervicalgia: Secondary | ICD-10-CM | POA: Insufficient documentation

## 2011-05-10 DIAGNOSIS — M2560 Stiffness of unspecified joint, not elsewhere classified: Secondary | ICD-10-CM | POA: Insufficient documentation

## 2011-05-16 ENCOUNTER — Ambulatory Visit: Payer: 59 | Admitting: Physical Therapy

## 2011-05-18 ENCOUNTER — Ambulatory Visit: Payer: 59 | Admitting: Physical Therapy

## 2011-05-23 ENCOUNTER — Ambulatory Visit: Payer: 59 | Attending: Internal Medicine | Admitting: Physical Therapy

## 2011-05-23 DIAGNOSIS — M542 Cervicalgia: Secondary | ICD-10-CM | POA: Insufficient documentation

## 2011-05-23 DIAGNOSIS — M2560 Stiffness of unspecified joint, not elsewhere classified: Secondary | ICD-10-CM | POA: Insufficient documentation

## 2011-05-23 DIAGNOSIS — IMO0001 Reserved for inherently not codable concepts without codable children: Secondary | ICD-10-CM | POA: Insufficient documentation

## 2011-05-23 DIAGNOSIS — R5381 Other malaise: Secondary | ICD-10-CM | POA: Insufficient documentation

## 2011-05-25 ENCOUNTER — Other Ambulatory Visit (HOSPITAL_COMMUNITY)
Admission: RE | Admit: 2011-05-25 | Discharge: 2011-05-25 | Disposition: A | Discharge status: Home / Self Care | Payer: 59 | Source: Ambulatory Visit | Attending: Oncology | Admitting: Oncology

## 2011-05-25 ENCOUNTER — Ambulatory Visit (HOSPITAL_BASED_OUTPATIENT_CLINIC_OR_DEPARTMENT_OTHER): Payer: 59 | Admitting: Physician Assistant

## 2011-05-25 ENCOUNTER — Encounter: Payer: Self-pay | Admitting: Physician Assistant

## 2011-05-25 ENCOUNTER — Other Ambulatory Visit (HOSPITAL_BASED_OUTPATIENT_CLINIC_OR_DEPARTMENT_OTHER): Payer: 59 | Admitting: Lab

## 2011-05-25 DIAGNOSIS — D72829 Elevated white blood cell count, unspecified: Secondary | ICD-10-CM | POA: Insufficient documentation

## 2011-05-25 LAB — CBC WITH DIFFERENTIAL/PLATELET
BASO%: 0.3 % (ref 0.0–2.0)
Basophils Absolute: 0 10*3/uL (ref 0.0–0.1)
EOS%: 0.9 % (ref 0.0–7.0)
HCT: 39 % (ref 34.8–46.6)
HGB: 13.8 g/dL (ref 11.6–15.9)
MCH: 34.4 pg — ABNORMAL HIGH (ref 25.1–34.0)
MONO#: 1.1 10*3/uL — ABNORMAL HIGH (ref 0.1–0.9)
NEUT%: 84.5 % — ABNORMAL HIGH (ref 38.4–76.8)
RDW: 14.1 % (ref 11.2–14.5)
WBC: 16.2 10*3/uL — ABNORMAL HIGH (ref 3.9–10.3)
lymph#: 1.2 10*3/uL (ref 0.9–3.3)

## 2011-05-25 LAB — COMPREHENSIVE METABOLIC PANEL
ALT: 16 U/L (ref 0–35)
AST: 15 U/L (ref 0–37)
Alkaline Phosphatase: 50 U/L (ref 39–117)
BUN: 15 mg/dL (ref 6–23)
Creatinine, Ser: 0.68 mg/dL (ref 0.50–1.10)
Potassium: 4 mEq/L (ref 3.5–5.3)

## 2011-05-25 LAB — T-HELPER CELLS (CD4) COUNT (NOT AT ARMC)
CD4 % Helper T Cell: 45 % (ref 33–55)
CD4 T Cell Abs: 550 uL (ref 400–2700)

## 2011-05-25 LAB — LACTATE DEHYDROGENASE: LDH: 220 U/L (ref 94–250)

## 2011-05-25 NOTE — Progress Notes (Signed)
[place] OFFICE PROGRESS NOTE  No primary provider on file.  CC: Teresa Medal, MD  Teresa Ng, MD  HISTORY:   Teresa Lee was seen today for follow-up of her leukocytosis which dates back to the spring of 2001. Teresa Lee's last visit here to see [name] was  02/28/2010    Since last seen, the patient has been diagnosed with fibromyalgia which is present in neck, shoulder, arms ,legs ,and hips clear she has been started on gabapentin and hydrocodone, with some control of her pain. In addition, the patient has been diagnosed with bulging because disc disease which she underwent an MRI of the C-spine without contrast on 03/17/2011 which was unrevealing except for cervical spondylosis. She received left shoulder steroid injections, last one in January of this year. She also has Crohn's disease. She also takes prednisone 5 mg a day for musculoskeletal disease.  She has not seen Dr. Michail Sermon in a while.  She thinks her last colonoscopy was about 3  years ago.  She occasionally has some abdominal pain but denies any hematochezia or melena. The patient does report intermittent fatigue but no difficulty completing ADLs. He needs any fever chills or night sweats. No shortness of breath or cough. She has normal appetite. She has dropped some weight from 140-134 today. Her last mammogram was in 2012 with negative results. On February 24, 2010 her white count 14.0, ANC 11.8, and her Sed rate was 20 in 09/2008. Today her WBC 16.2 with ANC 13.7  It will be recalled that back on August 29, 2007 the white count was 21.1. No Sed rate available for review.  MEDICAL HISTORY: Past Medical History  Diagnosis Date  . Leukocytosis   . Myasthenia gravis   . Hyperlipidemia   . Hypertension   . Asthma   . CTS (carpal tunnel syndrome)   . Herniated nucleus pulposus   . Fibromyalgia     SURGICAL HISTORY: No past surgical history on file.  MEDICATIONS: Current Outpatient Prescriptions  Medication Sig  Dispense Refill  . ALPRAZolam (XANAX) 0.25 MG tablet Take 0.25 mg by mouth at bedtime as needed.      . cholecalciferol (VITAMIN D) 400 UNITS TABS Take 1,000 Units by mouth.      . eszopiclone (LUNESTA) 2 MG TABS Take 3 mg by mouth at bedtime. Take immediately before bedtime      . gabapentin (NEURONTIN) 600 MG tablet Take 600 mg by mouth daily.      Marland Kitchen HYDROcodone-acetaminophen (LORTAB) 7.5-500 MG per tablet Take 1 tablet by mouth every 8 (eight) hours as needed.      Marland Kitchen losartan (COZAAR) 50 MG tablet Take 50 mg by mouth daily.      . meloxicam (MOBIC) 15 MG tablet Take 15 mg by mouth daily.      . mesalamine (PENTASA) 500 MG CR capsule Take 500 mg by mouth 4 (four) times daily.      . mycophenolate (CELLCEPT) 500 MG tablet Take by mouth 2 (two) times daily.      Marland Kitchen omeprazole (PRILOSEC) 40 MG capsule Take 40 mg by mouth daily.      . ondansetron (ZOFRAN-ODT) 4 MG disintegrating tablet Take 4 mg by mouth every 8 (eight) hours as needed.      . potassium chloride (KLOR-CON) 20 MEQ packet Take 20 mEq by mouth 2 (two) times daily.      . predniSONE (DELTASONE) 5 MG tablet Take 5 mg by mouth daily.      Marland Kitchen  triamterene-hydrochlorothiazide (DYAZIDE) 37.5-25 MG per capsule Take 1 capsule by mouth every morning.      . hyoscyamine (LEVSIN, ANASPAZ) 0.125 MG tablet Take 0.125 mg by mouth every 4 (four) hours as needed.        ALLERGIES:  is allergic to cefixime and sulfamethoxazole w/trimethoprim.  REVIEW OF SYSTEMS:  The rest of the 14-point review of system was negative.   Filed Vitals:   05/25/11 0938  BP: 112/78  Pulse: 92  Temp: 97.7 F (36.5 C)   Wt Readings from Last 3 Encounters:  05/25/11 134 lb (60.782 kg)  11/06/05 126 lb (57.153 kg)   ECOG Performance status:   PHYSICAL EXAMINATION:   General:  well-nourished in no acute distress.  Eyes:  no scleral icterus.  ENT:  There were no oropharyngeal lesions.  Neck was without thyromegaly.  Lymphatics:  Negative cervical, supraclavicular  or axillary adenopathy.  Respiratory: lungs were clear bilaterally without wheezing or crackles.  Cardiovascular:  Regular rate and rhythm, S1/S2, with a very soft 1/6 murmur, rub or gallop.  There was no pedal edema.  GI:  abdomen was soft, flat, nontender, nondistended, without organomegaly.  Muscoloskeletal:  no spinal tenderness of palpation of vertebral spine. There are some deformities in the digits.  Skin exam was without echymosis, petichae.  Neuro exam was nonfocal.  Patient was able to get on and off exam table without assistance.  Gait was normal.  Patient was alerted and oriented.  Attention was good.   Language was appropriate.  Mood was normal without depression.  Speech was not pressured.  Thought content was not tangential.         LABORATORY/RADIOLOGY DATA:   Lab 05/25/11 0913  WBC 16.2*  HGB 13.8  HCT 39.0  PLT 295  MCV 97.4  MCH 34.4*  MCHC 35.4  RDW 14.1  LYMPHSABS 1.2  MONOABS 1.1*  EOSABS 0.2  BASOSABS 0.0  BANDABS --    It will be recalled that [name] did not have any mutations of the JAK2 or BCR-ABL.  CMP   No results found for this basename: NA:5,K:5,CL:5,CO2:5,GLUCOSE:5,BUN:5,CREATININE:5,GFRCGP,:5,CALCIUM:5,MG:5,AST:5,ALT:5,ALKPHOS:5,BILITOT:5 in the last 168 hours      Component Value Date/Time   BILITOT 0.6 02/28/2010 0932     Radiology Studies:   MRI CERVICAL SPINE WITHOUT CONTRAST   Comparison: None.  Findings: There is advanced disc space narrowing at C4-5, C5-6, and C6-7. There is reversal of the normal cervical lordotic curve with a slight swan-neck deformity. The cord has a normal size and signal throughout. Craniocervical junction is unremarkable. There is no prevertebral soft tissue swelling. No neck masses are evident. The individual disc spaces were examined as follows:  C2-3: Mild bulge. C3-4: Normal. C4-5: Advanced disc space narrowing. Bilateral uncinate spurring. No definite C5 nerve root encroachment. C5-6: Central bulging  annular fibers. Bilateral facet arthropathy. The right greater than left uncinate spurring could  affect the right C6 nerve root C6-7: Central bulging annular fibers. Mild bilateral uncinate spurring in combination with severe disc space narrowing could affect either C7 nerve root. C7-T1: Mild annular bulging. No stenosis or disc protrusion. IMPRESSION: Multilevel spondylosis from C4-C7, potentially worst at C6-7. I do not see a dominant left-sided abnormality, instead there is relatively symmetric spondylosis. If clinically indicated, CT myelogram could be helpful in further evaluation.      ASSESSMENT AND PLAN: Cruzita continues to have mild leukocytosis.  This appears to be secondary to her underlying diseases as well as her prednisone. Dr. Ralene Ok has seen and  evaluated the patient, and the labs have been reviewed with her. Since there has been no significant change in her white count, she has been off her to return to our office for follow up versus returning to her family physician who will monitor her labs, referring her to [name] again as needed. She has opted to follow up with her primary physician. She knows to call us if she has any questions or concerns. It has been a pleasure to care for Mrs. Orren

## 2011-05-26 ENCOUNTER — Ambulatory Visit: Payer: 59 | Admitting: Physical Therapy

## 2011-05-29 ENCOUNTER — Ambulatory Visit: Payer: 59 | Admitting: Physical Therapy

## 2011-06-02 LAB — T-HELPER CELLS (CD4) COUNT (NOT AT ARMC)

## 2011-06-05 ENCOUNTER — Ambulatory Visit: Payer: 59 | Admitting: Physical Therapy

## 2011-06-08 ENCOUNTER — Ambulatory Visit: Payer: 59 | Admitting: *Deleted

## 2011-06-09 ENCOUNTER — Encounter: Payer: 59 | Admitting: Physical Therapy

## 2011-06-12 ENCOUNTER — Ambulatory Visit: Payer: 59 | Admitting: Physical Therapy

## 2011-06-12 ENCOUNTER — Encounter: Payer: Self-pay | Admitting: Oncology

## 2011-06-12 NOTE — Progress Notes (Signed)
T-Helper cells (CD4) were 550  ([id number]). ie 45%  (33-55)%.   Normal values.

## 2011-06-14 ENCOUNTER — Ambulatory Visit: Payer: 59 | Admitting: Physical Therapy

## 2011-06-14 ENCOUNTER — Encounter: Payer: 59 | Admitting: Physical Therapy

## 2011-07-04 ENCOUNTER — Ambulatory Visit: Payer: 59 | Attending: Internal Medicine | Admitting: Physical Therapy

## 2011-07-04 DIAGNOSIS — M2560 Stiffness of unspecified joint, not elsewhere classified: Secondary | ICD-10-CM | POA: Insufficient documentation

## 2011-07-04 DIAGNOSIS — IMO0001 Reserved for inherently not codable concepts without codable children: Secondary | ICD-10-CM | POA: Insufficient documentation

## 2011-07-04 DIAGNOSIS — M542 Cervicalgia: Secondary | ICD-10-CM | POA: Insufficient documentation

## 2011-07-04 DIAGNOSIS — R5381 Other malaise: Secondary | ICD-10-CM | POA: Insufficient documentation

## 2011-07-06 ENCOUNTER — Telehealth: Payer: Self-pay

## 2011-07-06 NOTE — Telephone Encounter (Signed)
Labs received from [facility] and forwarded to [facility]

## 2011-07-11 ENCOUNTER — Ambulatory Visit: Payer: 59 | Admitting: *Deleted

## 2011-07-18 ENCOUNTER — Ambulatory Visit: Payer: 59 | Admitting: Physical Therapy

## 2011-07-25 ENCOUNTER — Ambulatory Visit: Payer: 59 | Attending: Internal Medicine | Admitting: Physical Therapy

## 2011-07-25 DIAGNOSIS — IMO0001 Reserved for inherently not codable concepts without codable children: Secondary | ICD-10-CM | POA: Insufficient documentation

## 2011-07-25 DIAGNOSIS — R5381 Other malaise: Secondary | ICD-10-CM | POA: Insufficient documentation

## 2011-07-25 DIAGNOSIS — M542 Cervicalgia: Secondary | ICD-10-CM | POA: Insufficient documentation

## 2011-07-25 DIAGNOSIS — M2560 Stiffness of unspecified joint, not elsewhere classified: Secondary | ICD-10-CM | POA: Insufficient documentation

## 2011-08-21 ENCOUNTER — Other Ambulatory Visit: Payer: Self-pay | Admitting: Neurosurgery

## 2011-08-21 DIAGNOSIS — M542 Cervicalgia: Secondary | ICD-10-CM

## 2011-08-30 ENCOUNTER — Ambulatory Visit
Admission: RE | Admit: 2011-08-30 | Discharge: 2011-08-30 | Disposition: A | Discharge status: Home / Self Care | Payer: 59 | Source: Ambulatory Visit | Attending: Neurosurgery | Admitting: Neurosurgery

## 2011-08-30 VITALS — BP 124/81 | HR 74

## 2011-08-30 DIAGNOSIS — Z8669 Personal history of other diseases of the nervous system and sense organs: Secondary | ICD-10-CM

## 2011-08-30 DIAGNOSIS — M542 Cervicalgia: Secondary | ICD-10-CM

## 2011-08-30 MED ORDER — IOHEXOL 300 MG/ML  SOLN
1.0000 mL | Freq: Once | INTRAMUSCULAR | Status: AC | PRN
  Administered 2011-08-30: 1 mL via EPIDURAL

## 2011-08-30 MED ORDER — TRIAMCINOLONE ACETONIDE 40 MG/ML IJ SUSP (RADIOLOGY)
60.0000 mg | Freq: Once | INTRAMUSCULAR | Status: AC
  Administered 2011-08-30: 60 mg via EPIDURAL

## 2011-08-30 NOTE — Discharge Instructions (Signed)

## 2011-08-31 ENCOUNTER — Telehealth: Payer: Self-pay

## 2011-08-31 NOTE — Telephone Encounter (Signed)
Received labs from [facility], forwarded to [facility]

## 2011-09-11 ENCOUNTER — Encounter (HOSPITAL_COMMUNITY): Payer: Self-pay | Admitting: *Deleted

## 2011-09-14 ENCOUNTER — Encounter (HOSPITAL_COMMUNITY)
Admission: RE | Disposition: A | Discharge status: Home / Self Care | Payer: Self-pay | Source: Ambulatory Visit | Attending: Gastroenterology

## 2011-09-14 ENCOUNTER — Encounter (HOSPITAL_COMMUNITY): Payer: Self-pay | Admitting: *Deleted

## 2011-09-14 ENCOUNTER — Encounter (HOSPITAL_COMMUNITY): Payer: Self-pay | Admitting: Anesthesiology

## 2011-09-14 ENCOUNTER — Ambulatory Visit (HOSPITAL_COMMUNITY): Payer: 59 | Admitting: Anesthesiology

## 2011-09-14 ENCOUNTER — Ambulatory Visit (HOSPITAL_COMMUNITY)
Admission: RE | Admit: 2011-09-14 | Discharge: 2011-09-14 | Disposition: A | Discharge status: Home / Self Care | Payer: 59 | Source: Ambulatory Visit | Attending: Gastroenterology | Admitting: Gastroenterology

## 2011-09-14 ENCOUNTER — Encounter (HOSPITAL_COMMUNITY): Payer: Self-pay | Admitting: Gastroenterology

## 2011-09-14 DIAGNOSIS — I10 Essential (primary) hypertension: Secondary | ICD-10-CM | POA: Insufficient documentation

## 2011-09-14 DIAGNOSIS — K648 Other hemorrhoids: Secondary | ICD-10-CM | POA: Insufficient documentation

## 2011-09-14 DIAGNOSIS — K219 Gastro-esophageal reflux disease without esophagitis: Secondary | ICD-10-CM | POA: Insufficient documentation

## 2011-09-14 DIAGNOSIS — K633 Ulcer of intestine: Secondary | ICD-10-CM | POA: Insufficient documentation

## 2011-09-14 DIAGNOSIS — K5090 Crohn's disease, unspecified, without complications: Secondary | ICD-10-CM | POA: Insufficient documentation

## 2011-09-14 DIAGNOSIS — D126 Benign neoplasm of colon, unspecified: Secondary | ICD-10-CM | POA: Insufficient documentation

## 2011-09-14 DIAGNOSIS — J45909 Unspecified asthma, uncomplicated: Secondary | ICD-10-CM | POA: Insufficient documentation

## 2011-09-14 HISTORY — DX: Blindness, one eye, unspecified eye: H54.40

## 2011-09-14 HISTORY — DX: Myasthenia gravis without (acute) exacerbation: G70.00

## 2011-09-14 HISTORY — DX: Unspecified osteoarthritis, unspecified site: M19.90

## 2011-09-14 HISTORY — DX: Crohn's disease, unspecified, without complications: K50.90

## 2011-09-14 HISTORY — DX: Irritable bowel syndrome, unspecified: K58.9

## 2011-09-14 HISTORY — DX: Cardiac murmur, unspecified: R01.1

## 2011-09-14 HISTORY — DX: Gastro-esophageal reflux disease without esophagitis: K21.9

## 2011-09-14 SURGERY — COLONOSCOPY WITH PROPOFOL
Anesthesia: Monitor Anesthesia Care

## 2011-09-14 MED ORDER — LACTATED RINGERS IV SOLN
INTRAVENOUS | Status: DC | PRN
  Administered 2011-09-14 (×2): via INTRAVENOUS

## 2011-09-14 MED ORDER — MIDAZOLAM HCL 5 MG/5ML IJ SOLN
INTRAMUSCULAR | Status: DC | PRN
  Administered 2011-09-14: 2 mg via INTRAVENOUS

## 2011-09-14 MED ORDER — FENTANYL CITRATE 0.05 MG/ML IJ SOLN
INTRAMUSCULAR | Status: DC | PRN
  Administered 2011-09-14: 50 ug via INTRAVENOUS

## 2011-09-14 MED ORDER — KETAMINE HCL 10 MG/ML IJ SOLN
INTRAMUSCULAR | Status: DC | PRN
  Administered 2011-09-14: 20 mg via INTRAVENOUS
  Administered 2011-09-14 (×3): 10 mg via INTRAVENOUS

## 2011-09-14 MED ORDER — PROPOFOL 10 MG/ML IV EMUL
INTRAVENOUS | Status: DC | PRN
  Administered 2011-09-14: 100 ug/kg/min via INTRAVENOUS

## 2011-09-14 MED ORDER — LACTATED RINGERS IV SOLN: INTRAVENOUS | Status: DC

## 2011-09-14 SURGICAL SUPPLY — 24 items

## 2011-09-14 NOTE — Op Note (Signed)
[facility] [place], [place]  [id number]  COLONOSCOPY PROCEDURE REPORT  PATIENT:  Teresa Lee, Teresa Lee  MR#:  [id number] BIRTHDATE:  Mar 29, 1960, 50 yrs. old  GENDER:  female ENDOSCOPIST:  Wilford Corner, MD REF. BY: PROCEDURE DATE:  09/14/2011 PROCEDURE:  Colonoscopy with snare polypectomy ASA CLASS:  Class III INDICATIONS:  Crohn's disease MEDICATIONS:   See Anesthesia Report.  DESCRIPTION OF PROCEDURE:   After the risks benefits and alternatives of the procedure were thoroughly explained, informed consent was obtained.  The Pentax Ped Colon [id number] endoscope was introduced through the anus and advanced to the cecum, which was identified by both the appendix and ileocecal valve, without limitations.  The quality of the prep was good..  The instrument was then slowly withdrawn as the colon was fully examined. <<PROCEDUREIMAGES>>  FINDINGS:  Rectal exam unremarkable.  Pediatric colonoscope inserted into the colon and advanced to the cecum, where the appendiceal orifice and ileocecal valve were identified.    In order to reach the cecum loop reduction and abdominal pressure was necessary. In the cecum a 6 mm sessile polyp was seen and removed with a snare cautery. The terminal ileum was intubated and revealed 2 small clean-based superficial ulcers with normal-appearing surrounding mucosa. Terminal ileal biopsies were taken. On careful withdrawal of the colonoscope the colonic mucosa was normal in appearance. Random biopsies were taken throughout the colon to look for dysplasia in the setting of a history of Crohn's disease.    Retroflexion revealed small nonthrombosed internal hemorrhoids.  COMPLICATIONS:  None  IMPRESSION:    1. Colon polyp -s/p snare cautery 2. Terminal ileal ulcers (2 small) 3. Internal hemorrhoids 4. Otherwise normal appearing colonic mucosa  RECOMMENDATIONS:    1. F/U on path 2. Avoid NSAIDs 3. Resume previous  diet  ______________________________ Wilford Corner, MD  CC:  Tommy Medal, MD  n. Lorrin MaisWilford Corner at 09/14/2011 10:19 AM  Rolin Barry, [phone]

## 2011-09-14 NOTE — Interval H&P Note (Signed)
History and Physical Interval Note:  09/14/2011 9:14 AM  Teresa Lee  has presented today for surgery, with the diagnosis of reflux/ chrons  The various methods of treatment have been discussed with the patient and family. After consideration of risks, benefits and other options for treatment, the patient has consented to  Procedure(s) (LRB): COLONOSCOPY WITH PROPOFOL (N/A) ESOPHAGOGASTRODUODENOSCOPY (EGD) WITH PROPOFOL (N/A) as a surgical intervention .  The patient's history has been reviewed, patient examined, no change in status, stable for surgery.  I have reviewed the patients' chart and labs.  Questions were answered to the patient's satisfaction.     [place] C.

## 2011-09-14 NOTE — Transfer of Care (Signed)
Immediate Anesthesia Transfer of Care Note  Patient: Teresa Lee  Procedure(s) Performed: Procedure(s) (LRB): COLONOSCOPY WITH PROPOFOL (N/A) ESOPHAGOGASTRODUODENOSCOPY (EGD) WITH PROPOFOL (N/A)  Patient Location: PACU and Endoscopy Unit  Anesthesia Type: MAC  Level of Consciousness: awake and alert   Airway & Oxygen Therapy: Patient Spontanous Breathing and Patient connected to nasal cannula oxygen  Post-op Assessment: Report given to PACU RN and Post -op Vital signs reviewed and stable  Post vital signs: Reviewed and stable  Complications: No apparent anesthesia complications

## 2011-09-14 NOTE — Anesthesia Preprocedure Evaluation (Addendum)
Anesthesia Evaluation  Patient identified by MRN, date of birth, ID band Patient awake  General Assessment Comment:Myasthenia Gravis.  Reviewed: Allergy & Precautions, H&P , NPO status , Patient's Chart, lab work & pertinent test results  Airway Mallampati: II TM Distance: >3 FB Neck ROM: Full   Comment: Recent pinched nerve in neck, now better. Dental No notable dental hx.    Pulmonary asthma ,  breath sounds clear to auscultation  Pulmonary exam normal       Cardiovascular hypertension, Pt. on medications + Valvular Problems/Murmurs Rhythm:Regular Rate:Normal     Neuro/Psych  Neuromuscular disease negative psych ROS   GI/Hepatic negative GI ROS, Neg liver ROS,   Endo/Other  negative endocrine ROS  Renal/GU negative Renal ROS  negative genitourinary   Musculoskeletal negative musculoskeletal ROS (+)   Abdominal   Peds negative pediatric ROS (+)  Hematology negative hematology ROS (+)   Anesthesia Other Findings   Reproductive/Obstetrics negative OB ROS                         Anesthesia Physical Anesthesia Plan  ASA: II  Anesthesia Plan: MAC   Post-op Pain Management:    Induction: Intravenous  Airway Management Planned:   Additional Equipment:   Intra-op Plan:   Post-operative Plan:   Informed Consent: I have reviewed the patients History and Physical, chart, labs and discussed the procedure including the risks, benefits and alternatives for the proposed anesthesia with the patient or authorized representative who has indicated his/her understanding and acceptance.   Dental advisory given  Plan Discussed with: CRNA  Anesthesia Plan Comments:         Anesthesia Quick Evaluation

## 2011-09-14 NOTE — H&P (Signed)
  Date of Initial H&P: 09/08/11  History reviewed, patient examined, no change in status, stable for surgery. Colonoscopy for surveillance due to Crohn's ileocolitis. EGD due to acid reflux.

## 2011-09-14 NOTE — Discharge Instructions (Signed)
Will call you when path results available.

## 2011-09-14 NOTE — Anesthesia Postprocedure Evaluation (Signed)
  Anesthesia Post-op Note  Patient: Teresa Lee  Procedure(s) Performed: Procedure(s) (LRB): COLONOSCOPY WITH PROPOFOL (N/A) ESOPHAGOGASTRODUODENOSCOPY (EGD) WITH PROPOFOL (N/A)  Patient Location: PACU  Anesthesia Type: MAC  Level of Consciousness: awake and alert   Airway and Oxygen Therapy: Patient Spontanous Breathing  Post-op Pain: mild  Post-op Assessment: Post-op Vital signs reviewed, Patient's Cardiovascular Status Stable, Respiratory Function Stable, Patent Airway and No signs of Nausea or vomiting  Post-op Vital Signs: stable  Complications: No apparent anesthesia complications

## 2011-09-14 NOTE — Op Note (Signed)
[facility] [place],   [id number]  ENDOSCOPY PROCEDURE REPORT  PATIENT:  Teresa, Lee  MR#:  [id number] BIRTHDATE:  12/18/1960, 50 yrs. old  GENDER:  female  ENDOSCOPIST:  Wilford Corner, MD Referred by:  PROCEDURE DATE:  09/14/2011 PROCEDURE:  EGD, diagnostic [address] ASA CLASS:  Class III INDICATIONS:  GERD  MEDICATIONS:   See Anesthesia Report.  TOPICAL ANESTHETIC:  DESCRIPTION OF PROCEDURE:   After the risks benefits and alternatives of the procedure were thoroughly explained, informed consent was obtained.  The Pentax Gastroscope [id number] endoscope was introduced through the mouth and advanced to the second portion of the duodenum, without limitations.  The instrument was slowly withdrawn as the mucosa was fully examined. <<PROCEDUREIMAGES>>  FINDINGS:  The endoscope was inserted into the oropharynx and esophagus was intubated.  Endoscope was advanced into the stomach, which was unremarkable.  The endoscope was advanced to the duodenal bulb and second portion of duodenum which were unremarkable.  The endoscope was withdrawn back into the stomach and retroflexion revealed a normal appearing proximal stomach.  COMPLICATIONS:  None  ENDOSCOPIC IMPRESSION:  Normal EGD  RECOMMENDATIONS:    Lifestyle modifications for GERD; Continue daily PPI  REPEAT EXAM:  N/A  ______________________________ Wilford Corner, MD  CC:  Tommy Medal, MD  n. Lorrin MaisWilford Corner at 09/14/2011 10:10 AM  Rolin Barry, [phone]

## 2011-11-08 ENCOUNTER — Other Ambulatory Visit: Payer: Self-pay | Admitting: Neurosurgery

## 2011-11-08 DIAGNOSIS — M542 Cervicalgia: Secondary | ICD-10-CM

## 2011-11-17 ENCOUNTER — Ambulatory Visit
Admission: RE | Admit: 2011-11-17 | Discharge: 2011-11-17 | Disposition: A | Discharge status: Home / Self Care | Payer: 59 | Source: Ambulatory Visit | Attending: Neurosurgery | Admitting: Neurosurgery

## 2011-11-17 VITALS — BP 103/75 | HR 91

## 2011-11-17 DIAGNOSIS — M542 Cervicalgia: Secondary | ICD-10-CM

## 2011-11-17 MED ORDER — IOHEXOL 300 MG/ML  SOLN
1.0000 mL | Freq: Once | INTRAMUSCULAR | Status: AC | PRN
  Administered 2011-11-17: 1 mL via EPIDURAL

## 2011-11-17 MED ORDER — TRIAMCINOLONE ACETONIDE 40 MG/ML IJ SUSP (RADIOLOGY)
60.0000 mg | Freq: Once | INTRAMUSCULAR | Status: AC
  Administered 2011-11-17: 60 mg via EPIDURAL

## 2012-04-18 ENCOUNTER — Encounter: Payer: Self-pay | Admitting: Internal Medicine

## 2012-04-18 ENCOUNTER — Ambulatory Visit (INDEPENDENT_AMBULATORY_CARE_PROVIDER_SITE_OTHER): Payer: 59 | Admitting: Internal Medicine

## 2012-04-18 VITALS — BP 104/80 | HR 84 | Temp 97.3°F | Ht 65.0 in | Wt 135.8 lb

## 2012-04-18 DIAGNOSIS — J45909 Unspecified asthma, uncomplicated: Secondary | ICD-10-CM

## 2012-04-18 NOTE — Patient Instructions (Addendum)
Try Protonix 40 mg   Take 30-60 min before first meal of the day and Pepcid 20 mg one bedtime until return  GERD (REFLUX)  is an extremely common cause of respiratory symptoms, many times with no significant heartburn at all.    It can be treated with medication, but also with lifestyle changes including avoidance of late meals, excessive alcohol, smoking cessation, and avoid fatty foods, chocolate, peppermint, colas, red wine, and acidic juices such as orange juice.  NO MINT OR MENTHOL PRODUCTS SO NO COUGH DROPS  USE SUGARLESS CANDY INSTEAD (jolley ranchers or Stover's)  NO OIL BASED VITAMINS - use powdered substitutes.   If short of breath ok to use dulera 100 2 puffs every 12 hours if needed.  Please schedule a follow up office visit in 4 weeks, sooner if needed with all your medications in hand

## 2012-04-18 NOTE — Progress Notes (Signed)
Subjective:    Patient ID: Teresa Lee, female    DOB: [birth year]  MRN: [id number]  HPI  56 yowf never smoker prev Simonds/Naydelin Ziegler on prednisone chronically for MG/crohns dz with baseline fev1 around 2 liters when eval in pulmonary clinic in 2007 and asked to f/u prn if symptoms worsened.   In retropect she reports the med that made the most difference wasn't prednisone but rather singulair which stopped after one year  rx and while on it   very rare need for any albtuterol (which is exactly what she reported in 2007 while on daily prednisone) then p July 4th 2010  exposure to cats / dogs seemed worse and not particularly better with inhalers or nebs (30 min or less benefit)  With daily symptoms of sob, cough and hoarsness which went from bad to worse since Nov 2013 so referrred to pulmonary clinic 04/18/2012 by Dr Minna Antis.  04/18/2012 Koren Plyler/ ov cc variable sob sometimes happens at rest always assoc with hoarseness and not consistently better with saba. Restarted singulair x one month prior to OV  With miminal benefit. No obvious pattern to daytime variabilty or assoc excess mucus production or cp or chest tightness, subjective wheeze overt sinus or hb symptoms. No unusual exp hx or h/o childhood pna/ asthma or premature birth to her knowledge.   Sleeping ok without nocturnal  or early am exacerbation  of respiratory  c/o's or need for noct saba. Also denies any obvious fluctuation of symptoms with weather or environmental changes or other aggravating or alleviating factors except as outlined above    Review of Systems  Constitutional: Negative for fever and unexpected weight change.  HENT: Positive for ear pain, postnasal drip and sinus pressure. Negative for nosebleeds, congestion, sore throat, rhinorrhea, sneezing, trouble swallowing and dental problem.   Eyes: Negative for redness and itching.  Respiratory: Positive for cough and shortness of breath. Negative for chest tightness and wheezing.    Cardiovascular: Positive for palpitations. Negative for leg swelling.  Gastrointestinal: Positive for abdominal pain. Negative for nausea and vomiting.  Genitourinary: Negative for dysuria.  Musculoskeletal: Negative for joint swelling.  Skin: Negative for rash.  Neurological: Negative for headaches.  Hematological: Does not bruise/bleed easily.  Psychiatric/Behavioral: Negative for dysphoric mood. The patient is not nervous/anxious.        Objective:   Physical Exam  Min hoarse anxious wf who failed to answer a single question asked in a straightforward manner, tending to go off on tangents or answer questions with ambiguous medical terms or diagnoses and seemed aggravated  when asked the same question more than once for clarification.   Wt Readings from Last 3 Encounters:  04/18/12 135 lb 12.8 oz (61.598 kg)  09/14/11 134 lb (60.782 kg)  09/14/11 134 lb (60.782 kg)    HEENT: nl dentition, turbinates, and orophanx. Nl external ear canals without cough reflex   NECK :  without JVD/Nodes/TM/ nl carotid upstrokes bilaterally   LUNGS: no acc muscle use, clear to A and P bilaterally without cough on insp or exp maneuvers   CV:  RRR  no s3 or murmur or increase in P2, no edema   ABD:  soft and nontender with nl excursion in the supine position. No bruits or organomegaly, bowel sounds nl  MS:  warm without deformities, calf tenderness, cyanosis or clubbing  SKIN: warm and dry without lesions    NEURO:  alert, approp, no deficits          Assessment &  Plan:

## 2012-04-18 NOTE — Assessment & Plan Note (Addendum)
-   PFT  October 19, 2005,  FEV1 2 liters with no improvement after bronchodilators with a  pattern on flow volume that does not suggest significant airflow  obstruction, including any significant indication of small airways disease.  This spirometry is almost identical to the one she had in 1998 - Spirometry 04/18/2012  1.68 (60%) ratio 64  Symptoms are markedly disproportionate to objective findings and not clear this is all a  lung problem but pt does appear to have difficult airway management issues and variable airflow obstruction so does appear to have asthma  DDX of  difficult airways managment all start with A and  include Adherence, Ace Inhibitors, Acid Reflux, Active Sinus Disease, Alpha 1 Antitripsin deficiency, Anxiety masquerading as Airways dz,  ABPA,  allergy(esp in young), Aspiration (esp in elderly), Adverse effects of DPI,  Active smokers, plus two Bs  = Bronchiectasis and Beta blocker use..and one C= CHF  Adherence is always the initial "prime suspect" and is a multilayered concern that requires a "trust but verify" approach in every patient - starting with knowing how to use medications, especially inhalers, correctly, keeping up with refills and understanding the fundamental difference between maintenance and prns vs those medications only taken for a very short course and then stopped and not refilled- not clear at all we have an accurate or updated list of her meds so was asked to bring them all with her on her next ov   . The proper method of use, as well as anticipated side effects, of a metered-dose inhaler are discussed and demonstrated to the patient. Improved effectiveness after extensive coaching during this visit to a level of approximately  75% so try dulera 100 2bid  ? Acid reflux > hoarseness supports this possibility > max acid suppression and diet reviewed  ? Anxiety > typically a dx of exclusion but based on her responses to questions  May need to be placed higher  here

## 2012-04-19 MED ORDER — MOMETASONE FURO-FORMOTEROL FUM 100-5 MCG/ACT IN AERO: INHALATION_SPRAY | RESPIRATORY_TRACT | Status: DC

## 2012-04-19 NOTE — Addendum Note (Signed)
Addended by: Christinia Gully B on: 04/19/2012 12:05 AM   Modules accepted: Orders

## 2012-04-27 ENCOUNTER — Emergency Department (HOSPITAL_COMMUNITY)
Admission: EM | Admit: 2012-04-27 | Discharge: 2012-04-27 | Disposition: A | Discharge status: Home / Self Care | Payer: 59 | Attending: Emergency Medicine | Admitting: Emergency Medicine

## 2012-04-27 ENCOUNTER — Encounter (HOSPITAL_COMMUNITY): Payer: Self-pay

## 2012-04-27 DIAGNOSIS — R0602 Shortness of breath: Secondary | ICD-10-CM | POA: Insufficient documentation

## 2012-04-27 DIAGNOSIS — G7000 Myasthenia gravis without (acute) exacerbation: Secondary | ICD-10-CM | POA: Insufficient documentation

## 2012-04-27 DIAGNOSIS — Z8739 Personal history of other diseases of the musculoskeletal system and connective tissue: Secondary | ICD-10-CM | POA: Insufficient documentation

## 2012-04-27 DIAGNOSIS — Z79899 Other long term (current) drug therapy: Secondary | ICD-10-CM | POA: Insufficient documentation

## 2012-04-27 DIAGNOSIS — R197 Diarrhea, unspecified: Secondary | ICD-10-CM | POA: Insufficient documentation

## 2012-04-27 DIAGNOSIS — Z85820 Personal history of malignant melanoma of skin: Secondary | ICD-10-CM | POA: Insufficient documentation

## 2012-04-27 DIAGNOSIS — I10 Essential (primary) hypertension: Secondary | ICD-10-CM | POA: Insufficient documentation

## 2012-04-27 DIAGNOSIS — Z8679 Personal history of other diseases of the circulatory system: Secondary | ICD-10-CM | POA: Insufficient documentation

## 2012-04-27 DIAGNOSIS — R112 Nausea with vomiting, unspecified: Secondary | ICD-10-CM | POA: Insufficient documentation

## 2012-04-27 DIAGNOSIS — E785 Hyperlipidemia, unspecified: Secondary | ICD-10-CM | POA: Insufficient documentation

## 2012-04-27 DIAGNOSIS — K589 Irritable bowel syndrome without diarrhea: Secondary | ICD-10-CM | POA: Insufficient documentation

## 2012-04-27 DIAGNOSIS — Z862 Personal history of diseases of the blood and blood-forming organs and certain disorders involving the immune mechanism: Secondary | ICD-10-CM | POA: Insufficient documentation

## 2012-04-27 DIAGNOSIS — Z8669 Personal history of other diseases of the nervous system and sense organs: Secondary | ICD-10-CM | POA: Insufficient documentation

## 2012-04-27 DIAGNOSIS — K219 Gastro-esophageal reflux disease without esophagitis: Secondary | ICD-10-CM | POA: Insufficient documentation

## 2012-04-27 DIAGNOSIS — J45909 Unspecified asthma, uncomplicated: Secondary | ICD-10-CM | POA: Insufficient documentation

## 2012-04-27 DIAGNOSIS — K5090 Crohn's disease, unspecified, without complications: Secondary | ICD-10-CM | POA: Insufficient documentation

## 2012-04-27 LAB — COMPREHENSIVE METABOLIC PANEL
BUN: 31 mg/dL — ABNORMAL HIGH (ref 6–23)
CO2: 20 mEq/L (ref 19–32)
Calcium: 10 mg/dL (ref 8.4–10.5)
Chloride: 97 mEq/L (ref 96–112)
Creatinine, Ser: 0.86 mg/dL (ref 0.50–1.10)
GFR calc non Af Amer: 77 mL/min — ABNORMAL LOW (ref >90)
Glucose, Bld: 141 mg/dL — ABNORMAL HIGH (ref 70–99)
Total Bilirubin: 0.4 mg/dL (ref 0.3–1.2)

## 2012-04-27 LAB — CBC WITH DIFFERENTIAL/PLATELET
Eosinophils Relative: 2 % (ref 0–5)
HCT: 45.8 % (ref 36.0–46.0)
Hemoglobin: 15.9 g/dL — ABNORMAL HIGH (ref 12.0–15.0)
Lymphocytes Relative: 5 % — ABNORMAL LOW (ref 12–46)
Lymphs Abs: 0.7 10*3/uL (ref 0.7–4.0)
MCV: 90.9 fL (ref 78.0–100.0)
Monocytes Absolute: 1.1 10*3/uL — ABNORMAL HIGH (ref 0.1–1.0)
Monocytes Relative: 8 % (ref 3–12)
RDW: 14.6 % (ref 11.5–15.5)
WBC: 15.1 10*3/uL — ABNORMAL HIGH (ref 4.0–10.5)

## 2012-04-27 LAB — LIPASE, BLOOD: Lipase: 17 U/L (ref 11–59)

## 2012-04-27 LAB — POCT I-STAT TROPONIN I: Troponin i, poc: 0 ng/mL (ref 0.00–0.08)

## 2012-04-27 MED ORDER — PROMETHAZINE HCL 25 MG/ML IJ SOLN
25.0000 mg | Freq: Once | INTRAMUSCULAR | Status: AC
  Administered 2012-04-27: 25 mg via INTRAMUSCULAR
  Filled 2012-04-27: qty 1

## 2012-04-27 MED ORDER — PROMETHAZINE HCL 25 MG PO TABS: 25.0000 mg | ORAL_TABLET | Freq: Four times a day (QID) | ORAL | Status: DC | PRN

## 2012-04-27 MED ORDER — FLEET ENEMA 7-19 GM/118ML RE ENEM: 1.0000 | ENEMA | Freq: Once | RECTAL | Status: DC

## 2012-04-27 MED ORDER — SODIUM CHLORIDE 0.9 % IV BOLUS (SEPSIS)
1000.0000 mL | Freq: Once | INTRAVENOUS | Status: AC
  Administered 2012-04-27: 1000 mL via INTRAVENOUS

## 2012-04-27 MED ORDER — KETOROLAC TROMETHAMINE 60 MG/2ML IM SOLN
60.0000 mg | Freq: Once | INTRAMUSCULAR | Status: AC
  Administered 2012-04-27: 60 mg via INTRAMUSCULAR
  Filled 2012-04-27: qty 2

## 2012-04-27 MED ORDER — SODIUM CHLORIDE 0.9 % IV SOLN
INTRAVENOUS | Status: DC
  Administered 2012-04-27: 17:00:00 via INTRAVENOUS

## 2012-04-27 NOTE — ED Notes (Signed)
Pt recently dx with GERD.  Presents here today with c/o N/V and SOB.  Pt states she is burning from mid chest to lower abdomen.  Symptoms started at 0200.

## 2012-04-27 NOTE — ED Notes (Signed)
Care transferred, report received Angela, RN. 

## 2012-04-27 NOTE — ED Provider Notes (Signed)
Medical screening examination/treatment/procedure(s) were conducted as a shared visit with non-physician practitioner(s) and myself.  I personally evaluated the patient during the encounter   Loren Racer, MD 04/27/12 2352

## 2012-04-27 NOTE — ED Notes (Signed)
2nd RN attempted IV start with no success.  Pt still on list for IV team.

## 2012-04-27 NOTE — ED Notes (Signed)
IV start attempted x 2, unsuccessful.  IV team paged for IV start.

## 2012-04-27 NOTE — ED Provider Notes (Signed)
History     CSN: [id number]  Arrival date & time 04/27/12  1412   First MD Initiated Contact with Patient 04/27/12 1510      Chief Complaint  Patient presents with  . Nausea  . Emesis  . Shortness of Breath  . Diarrhea    (Consider location/radiation/quality/duration/timing/severity/associated sxs/prior treatment) HPI Comments: Pt presents today for nausea, vomiting, and diarrhea x 1 hour.  States she was somewhat SOB at onset but that has improved.   There is also a burning sensation radiating from her abdomen to mid-sternum.  Since arriving in ED she has started experiencing chills.  Family member notes that she did eat [place] food yesterday which is not in her usual diet.  Multiple medical conditions and polypharmacy.  Recently diagnosed with GERD.  Started on Protonix daily, and Pepcid at bedtime.  Has been taking these medications approximately one week.  Also recently started on Savella but has only taken one dose.   No blood noted in vomit or stool.  Has used Zofran ODT and phenergan suppository with some relief.  Tried drinking small amounts of water but vomited soon after.  Denies any chest pain.  Patient is a 52 y.o. female presenting with vomiting, shortness of breath, and diarrhea. The history is provided by the patient and a relative.  Emesis Associated symptoms: chills and diarrhea   Shortness of Breath Associated symptoms: vomiting   Diarrhea Associated symptoms: chills and vomiting     Past Medical History  Diagnosis Date  . Leukocytosis   . Myasthenia gravis   . Hyperlipidemia   . Hypertension   . Asthma   . CTS (carpal tunnel syndrome)   . Herniated nucleus pulposus   . Fibromyalgia   . Arthritis     osteoarthritis  . DJD (degenerative joint disease)     Both cervical spine and LS spine  . Blind left eye   . Heart murmur   . GERD (gastroesophageal reflux disease)   . IBS (irritable bowel syndrome)   . Crohn's disease   . Myasthenia gravis   . Cancer      skin    Past Surgical History  Procedure Laterality Date  . Cholecystectomy    . Thymectomy      due to myastenia gravis  . Carpal tunnel release      bilateral  . Melanoma excision    . Breast reduction surgery    . Endometrial ablation w/ novasure    . Cervical disc inj    . Thymectomy    . Nasal sinus surgery      Family History  Problem Relation Age of Onset  . Asthma Daughter   . Heart disease Mother   . Cancer Mother     History  Substance Use Topics  . Smoking status: Never Smoker   . Smokeless tobacco: Never Used  . Alcohol Use: Yes     Comment: occassional wine    OB History   Grav Para Term Preterm Abortions TAB SAB Ect Mult Living                  Review of Systems  Constitutional: Positive for chills.  Respiratory: Positive for shortness of breath.   Gastrointestinal: Positive for nausea, vomiting and diarrhea.  All other systems reviewed and are negative.    Allergies  Percocet; Sulfamethoxazole w-trimethoprim; Orange concentrate; Other; Tomato; and Cefixime  Home Medications   Current Outpatient Rx  Name  Route  Sig  Dispense  Refill  . albuterol (PROVENTIL) (2.5 MG/3ML) 0.083% nebulizer solution   Nebulization   Take 2.5 mg by nebulization every 6 (six) hours as needed for wheezing.         . Cholecalciferol (VITAMIN D-3) 5000 UNITS TABS   Oral   Take 1 tablet by mouth daily.         . diclofenac sodium (VOLTAREN) 1 % GEL   Topical   Apply 2 g topically 4 (four) times daily as needed (for back/shoulder pain).         . eszopiclone (LUNESTA) 2 MG TABS   Oral   Take 3 mg by mouth at bedtime. Take immediately before bedtime         . famotidine (PEPCID) 20 MG tablet   Oral   Take 20 mg by mouth at bedtime.         . gabapentin (NEURONTIN) 600 MG tablet   Oral   Take 300-900 mg by mouth 2 (two) times daily. 300mg  in am, 900mg  at night         . HYDROcodone-acetaminophen (LORTAB) 7.5-500 MG per tablet   Oral    Take 0.5 tablets by mouth 3 (three) times daily.          Marland Kitchen losartan (COZAAR) 50 MG tablet   Oral   Take 50 mg by mouth daily.         . meloxicam (MOBIC) 15 MG tablet   Oral   Take 15 mg by mouth daily.         . mesalamine (PENTASA) 500 MG CR capsule   Oral   Take 1,000 mg by mouth 2 (two) times daily.          . Milnacipran (SAVELLA) 50 MG TABS   Oral   Take 50 mg by mouth 2 (two) times daily.         . mometasone-formoterol (DULERA) 100-5 MCG/ACT AERO   Inhalation   Inhale 2 puffs into the lungs 2 (two) times daily.         . mycophenolate (CELLCEPT) 500 MG tablet   Oral   Take by mouth 2 (two) times daily.         . ondansetron (ZOFRAN-ODT) 4 MG disintegrating tablet   Oral   Take 4 mg by mouth every 8 (eight) hours as needed for nausea.          . pantoprazole (PROTONIX) 40 MG tablet   Oral   Take 40 mg by mouth daily.         . potassium chloride (KLOR-CON) 20 MEQ packet   Oral   Take 10-20 mEq by mouth daily.          . predniSONE (DELTASONE) 5 MG tablet   Oral   Take 5 mg by mouth daily.         . traZODone (DESYREL) 50 MG tablet   Oral   Take 50 mg by mouth at bedtime.         . triamterene-hydrochlorothiazide (DYAZIDE) 37.5-25 MG per capsule   Oral   Take 1 capsule by mouth every morning.         . zolpidem (AMBIEN CR) 12.5 MG CR tablet   Oral   Take 12.5 mg by mouth at bedtime as needed for sleep.           BP 105/69  Temp(Src) 99.5 F (37.5 C) (Oral)  Resp 11  SpO2 95%  Physical Exam  Nursing note and vitals  reviewed. Constitutional: She is oriented to person, place, and time. She appears well-developed and well-nourished.  HENT:  Head: Normocephalic and atraumatic.  Nose: Nose normal.  Mouth/Throat: Uvula is midline. Mucous membranes are dry. No oropharyngeal exudate.  Eyes: Conjunctivae and EOM are normal. Pupils are equal, round, and reactive to light.  Neck: Normal range of motion. Neck supple.   Cardiovascular: Regular rhythm.  Tachycardia present.   Pulmonary/Chest: Effort normal and breath sounds normal. She has no wheezes. She has no rhonchi.  Abdominal: Soft. Bowel sounds are normal. She exhibits no mass. There is tenderness in the periumbilical area. There is no CVA tenderness, no tenderness at McBurney's point and negative Murphy's sign.  Neurological: She is alert and oriented to person, place, and time. She has normal strength. No cranial nerve deficit or sensory deficit.  Skin: Skin is warm and dry.  Psychiatric: She has a normal mood and affect.    ED Course  Procedures (including critical care time)  Labs Reviewed  CBC WITH DIFFERENTIAL - Abnormal; Notable for the following:    WBC 15.1 (*)    Hemoglobin 15.9 (*)    Neutrophils Relative 85 (*)    Neutro Abs 12.9 (*)    Lymphocytes Relative 5 (*)    Monocytes Absolute 1.1 (*)    All other components within normal limits  COMPREHENSIVE METABOLIC PANEL - Abnormal; Notable for the following:    Glucose, Bld 141 (*)    BUN 31 (*)    Total Protein 8.6 (*)    GFR calc non Af Amer 77 (*)    GFR calc Af Amer 90 (*)    All other components within normal limits  LIPASE, BLOOD  POCT I-STAT TROPONIN I   No results found.   1. Nausea & vomiting   2. Diarrhea       MDM  3:41 PM Pt evaluated.  4 unsuccessful attempts at IV insertion.  IM phenergan and Toradol given.  CBC, CMP, Lipase pending.  Trop negative.  Vomiting has ceased and pt no longer feels SOB at this time.  Will continue attempting IV insertion.  4:33 PM Labs as above.  Phenergan and toradol provided relief.  Still no IV placement.  Pt has tried eating a few ice chips.  Diarrheal episodes and abdominal burning continue.  Offered GI cocktail but pt declined.   4:59 PM IV successfully placed.  Pt still complaining of headache, likely due to dehydration.  Will let fluids run a while longer and re-evaluate.  7:09 PM Second bag of fluids running.  HR  stabilizing nicely.  Pt is resting comfortably and not complaining of any pain or nausea at this time.  Likely d/c soon.  8:21 PM Pt re-evaluated.  HR stable and will d/c.  Rx given for phenergan to use as needed for nausea.  Instructed to return to ED for new or worsening symptoms.  Garlon Hatchet, PA-C 04/27/12 2110

## 2012-05-24 ENCOUNTER — Ambulatory Visit: Payer: 59 | Admitting: Internal Medicine

## 2012-08-09 ENCOUNTER — Ambulatory Visit: Payer: 59 | Admitting: Internal Medicine

## 2013-07-25 ENCOUNTER — Ambulatory Visit (INDEPENDENT_AMBULATORY_CARE_PROVIDER_SITE_OTHER): Payer: 59 | Admitting: Internal Medicine

## 2013-07-25 ENCOUNTER — Encounter: Payer: Self-pay | Admitting: Internal Medicine

## 2013-07-25 VITALS — BP 158/95 | HR 74 | Ht 60.5 in | Wt 139.0 lb

## 2013-07-25 DIAGNOSIS — R0602 Shortness of breath: Secondary | ICD-10-CM

## 2013-07-25 DIAGNOSIS — R002 Palpitations: Secondary | ICD-10-CM

## 2013-07-25 DIAGNOSIS — R079 Chest pain, unspecified: Secondary | ICD-10-CM

## 2013-07-25 NOTE — Patient Instructions (Addendum)
Your physician has requested that you have an echocardiogram. Echocardiography is a painless test that uses sound waves to create images of your heart. It provides your doctor with information about the size and shape of your heart and how well your heart's chambers and valves are working. This procedure takes approximately one hour. There are no restrictions for this procedure.  Your physician has requested that you have en exercise stress myoview. For further information please visit [email or web address]. Please follow instruction sheet, as given.  Your physician has recommended that you wear a 48 hour holter monitor. Holter monitors are medical devices that record the heart's electrical activity. Doctors most often use these monitors to diagnose arrhythmias. Arrhythmias are problems with the speed or rhythm of the heartbeat. The monitor is a small, portable device. You can wear one while you do your normal daily activities. This is usually used to diagnose what is causing palpitations/syncope (passing out).  Your physician recommends that you return for lab work in: when you come for your echo  Your physician recommends that you schedule a follow-up appointment in: 1 month with Dr. Rayann Heman.

## 2013-07-28 NOTE — Progress Notes (Signed)
Primary Care Physician: Tommy Medal, MD   Teresa Lee is a 53 y.o. female with a h/o palpitations who presents for cardiology evaluation.  She reports having frequent palpitations.  These have been present for many months and occur on a daily basis.  It appears that episodes last several seconds.  They are more pronounced when she is stressed or excited.  She denies prolonged palpitations.  She has occasional exertional SOB and chest tightness.    Today, she denies symptoms of orthopnea, PND, lower extremity edema, dizziness, presyncope, syncope, or neurologic sequela. The patient is tolerating medications without difficulties and is otherwise without complaint today.   Past Medical History  Diagnosis Date  . Leukocytosis   . Myasthenia gravis   . Hyperlipidemia   . Hypertension   . Asthma   . CTS (carpal tunnel syndrome)   . Herniated nucleus pulposus   . Fibromyalgia   . Arthritis     osteoarthritis  . DJD (degenerative joint disease)     Both cervical spine and LS spine  . Blind left eye   . Heart murmur   . GERD (gastroesophageal reflux disease)   . IBS (irritable bowel syndrome)   . Crohn's disease   . Myasthenia gravis   . Cancer     skin   Past Surgical History  Procedure Laterality Date  . Cholecystectomy    . Thymectomy      due to myastenia gravis  . Carpal tunnel release      bilateral  . Melanoma excision    . Breast reduction surgery    . Endometrial ablation w/ novasure    . Cervical disc inj    . Thymectomy    . Nasal sinus surgery      Current Outpatient Prescriptions  Medication Sig Dispense Refill  . albuterol (PROVENTIL) (2.5 MG/3ML) 0.083% nebulizer solution Take 2.5 mg by nebulization every 6 (six) hours as needed for wheezing.      . Cholecalciferol (VITAMIN D-3) 5000 UNITS TABS Take 1 tablet by mouth daily.      Marland Kitchen desmopressin (DDAVP) 0.1 MG tablet Take 0.1 mg by mouth daily.      . diclofenac sodium (VOLTAREN) 1 % GEL Apply 2 g topically  4 (four) times daily as needed (for back/shoulder pain).      Marland Kitchen estradiol (ESTRACE) 2 MG tablet Take 2 mg by mouth daily.      . famotidine (PEPCID) 20 MG tablet Take 20 mg by mouth at bedtime.      Marland Kitchen FLUoxetine (PROZAC) 40 MG capsule Take 40 mg by mouth daily.      Marland Kitchen gabapentin (NEURONTIN) 600 MG tablet Take 300-900 mg by mouth 2 (two) times daily. 364m in am, 9058mat night      . HYDROcodone-acetaminophen (NORCO) 10-325 MG per tablet Take 1 tablet by mouth every 6 (six) hours as needed.      . Marland Kitchenosartan (COZAAR) 50 MG tablet Take 50 mg by mouth daily.      . meloxicam (MOBIC) 15 MG tablet Take 15 mg by mouth daily.      . mesalamine (PENTASA) 500 MG CR capsule Take 1,000 mg by mouth 2 (two) times daily.       . Milnacipran (SAVELLA) 50 MG TABS Take 50 mg by mouth 2 (two) times daily.      . mycophenolate (CELLCEPT) 500 MG tablet Take by mouth 2 (two) times daily.      . ondansetron (ZOFRAN-ODT) 4 MG disintegrating tablet  Take 4 mg by mouth every 8 (eight) hours as needed for nausea.       . pantoprazole (PROTONIX) 40 MG tablet Take 40 mg by mouth daily.      . potassium chloride (KLOR-CON) 20 MEQ packet Take 10-20 mEq by mouth daily.       . predniSONE (DELTASONE) 5 MG tablet Take 5 mg by mouth daily.      . QUEtiapine (SEROQUEL) 25 MG tablet Take 25 mg by mouth at bedtime.      . triamterene-hydrochlorothiazide (DYAZIDE) 37.5-25 MG per capsule Take 1 capsule by mouth every morning.       No current facility-administered medications for this visit.    Allergies  Allergen Reactions  . Percocet [Oxycodone-Acetaminophen] Hives  . Sulfamethoxazole-Trimethoprim Hives  . Orange Concentrate [Flavoring Agent]     Acid reflux  . Other     Some nuts cause a rash  . Tomato     Burns skin  . Cefixime     Due to mysetina gravis    History   Social History  . Marital Status: Married    Spouse Name: N/A    Number of Children: 1  . Years of Education: N/A   Occupational History  .  disabled    Social History Main Topics  . Smoking status: Never Smoker   . Smokeless tobacco: Never Used  . Alcohol Use: Yes     Comment: occassional wine  . Drug Use: No  . Sexual Activity: Not on file   Other Topics Concern  . Not on file   Social History Narrative  . No narrative on file    Family History  Problem Relation Age of Onset  . Asthma Daughter   . Heart disease Mother   . Cancer Mother     ROS- All systems are reviewed and negative except as per the HPI above.  In addition, she has difficulty sleeping.  Physical Exam: Filed Vitals:   07/25/13 0823  BP: 158/95  Pulse: 74  Height: 5' 0.5" (1.537 m)  Weight: 139 lb (63.05 kg)    GEN- The patient is well appearing, alert and oriented x 3 today.   Head- normocephalic, atraumatic Eyes-  Sclera clear, conjunctiva pink Ears- hearing intact Oropharynx- clear Neck- supple, no JVP Lymph- no cervical lymphadenopathy Lungs- Clear to ausculation bilaterally, normal work of breathing Heart- Regular rate and rhythm, no murmurs, rubs or gallops, PMI not laterally displaced GI- soft, NT, ND, + BS Extremities- no clubbing, cyanosis, or edema MS- no significant deformity or atrophy Skin- no rash or lesion Psych- euthymic mood, full affect  EKG today reveals sinus rhythm 74 bpm, otherwise normal ekg Records including records from Dr Dimas Millin office are reviewed  Assessment and Plan:  1. Palpitations Unclear etiology Given their frequency, we should be able to assess with a 48 hours holter. No change in medicines at this time Obtain tfts upon return  2. SOB/ chest tightness May be related to asthma however I think that further CV risks stratification is required I will obtain an echo as well as GXT myoview to further assess If low risk then no further CV testing is planned  3. htn Repeat by MD is 150/88 Lifestyle modification including sodium reduction are advised

## 2013-07-29 ENCOUNTER — Encounter: Payer: Self-pay | Admitting: *Deleted

## 2013-07-29 ENCOUNTER — Encounter (INDEPENDENT_AMBULATORY_CARE_PROVIDER_SITE_OTHER): Payer: 59

## 2013-07-29 DIAGNOSIS — R079 Chest pain, unspecified: Secondary | ICD-10-CM

## 2013-07-29 DIAGNOSIS — R002 Palpitations: Secondary | ICD-10-CM

## 2013-07-29 NOTE — Progress Notes (Signed)
Patient ID: Teresa Lee, female   DOB: March 06, 1961, 53 y.o.   MRN: [id number] E-cardio 48 hour holter monitor applied to patient.

## 2013-08-06 ENCOUNTER — Ambulatory Visit (HOSPITAL_COMMUNITY): Payer: 59 | Attending: Internal Medicine | Admitting: Radiology

## 2013-08-06 ENCOUNTER — Other Ambulatory Visit (HOSPITAL_COMMUNITY): Payer: Self-pay | Admitting: Pain Medicine

## 2013-08-06 VITALS — BP 150/91 | HR 75 | Ht 60.0 in | Wt 142.0 lb

## 2013-08-06 DIAGNOSIS — M5137 Other intervertebral disc degeneration, lumbosacral region: Secondary | ICD-10-CM

## 2013-08-06 DIAGNOSIS — I10 Essential (primary) hypertension: Secondary | ICD-10-CM | POA: Insufficient documentation

## 2013-08-06 DIAGNOSIS — R0789 Other chest pain: Secondary | ICD-10-CM | POA: Insufficient documentation

## 2013-08-06 DIAGNOSIS — R0602 Shortness of breath: Secondary | ICD-10-CM

## 2013-08-06 DIAGNOSIS — G894 Chronic pain syndrome: Secondary | ICD-10-CM

## 2013-08-06 DIAGNOSIS — Z8249 Family history of ischemic heart disease and other diseases of the circulatory system: Secondary | ICD-10-CM | POA: Insufficient documentation

## 2013-08-06 DIAGNOSIS — R0989 Other specified symptoms and signs involving the circulatory and respiratory systems: Secondary | ICD-10-CM | POA: Insufficient documentation

## 2013-08-06 DIAGNOSIS — R079 Chest pain, unspecified: Secondary | ICD-10-CM

## 2013-08-06 DIAGNOSIS — R0609 Other forms of dyspnea: Secondary | ICD-10-CM | POA: Insufficient documentation

## 2013-08-06 DIAGNOSIS — R002 Palpitations: Secondary | ICD-10-CM | POA: Insufficient documentation

## 2013-08-06 MED ORDER — TECHNETIUM TC 99M SESTAMIBI GENERIC - CARDIOLITE
10.0000 | Freq: Once | INTRAVENOUS | Status: AC | PRN
  Administered 2013-08-06: 10 via INTRAVENOUS

## 2013-08-06 MED ORDER — TECHNETIUM TC 99M SESTAMIBI GENERIC - CARDIOLITE
30.0000 | Freq: Once | INTRAVENOUS | Status: AC | PRN
  Administered 2013-08-06: 30 via INTRAVENOUS

## 2013-08-06 NOTE — Progress Notes (Signed)
[facility] [address] [place], [place] [address] [phone]    Cardiology Nuclear Med Study  Teresa Lee is a 54 y.o. female     MRN : [id number]     DOB: 10-Nov-1960  Procedure Date: 08/06/2013  Nuclear Med Background Indication for Stress Test:  Evaluation for Ischemia History:  No known CAD, Echo (pending) Cardiac Risk Factors: Family History - CAD, Hypertension and Lipids  Symptoms:  Chest Tightness, DOE and Palpitations   Nuclear Pre-Procedure Caffeine/Decaff Intake:  None NPO After: 7:00pm   Lungs:  clear O2 Sat: 98% on room air. IV 0.9% NS with Angio Cath:  22g  IV Site: R Hand  IV Started by:  Crissie Figures, RN  Chest Size (in):  36 Cup Size: C  Height: 5' (1.524 m)  Weight:  142 lb (64.411 kg)  BMI:  Body mass index is 27.73 kg/(m^2). Tech Comments:  N/A    Nuclear Med Study 1 or 2 day study: 1 day  Stress Test Type:  Stress  Reading MD: N/A  Order Authorizing Provider:  Thompson Grayer, MD  Resting Radionuclide: Technetium 45mSestamibi  Resting Radionuclide Dose: 11.0 mCi   Stress Radionuclide:  Technetium 966mestamibi  Stress Radionuclide Dose: 33.0 mCi           Stress Protocol Rest HR: 75 Stress HR: 157  Rest BP: 150/91 Stress BP: 150/79  Exercise Time (min): 6:00 METS: 7.0           Dose of Adenosine (mg):  n/a Dose of Lexiscan: n/a mg  Dose of Atropine (mg): n/a Dose of Dobutamine: n/a mcg/kg/min (at max HR)  Stress Test Technologist: ShGlade LloydBS-ES  Nuclear Technologist:  StCharlton AmorCNMT     Rest Procedure:  Myocardial perfusion imaging was performed at rest 45 minutes following the intravenous administration of Technetium 99103mstamibi. Rest ECG: NSR - Normal EKG  Stress Procedure:  The patient exercised on the treadmill utilizing the Bruce Protocol for 6:00 minutes. The patient stopped due to SOB, fatigue and denied any chest pain.  Technetium 56m103mtamibi was injected at peak exercise and  myocardial perfusion imaging was performed after a brief delay. Stress ECG: No significant change from baseline ECG  QPS Raw Data Images:  Mild breast attenuation.  Normal left ventricular size. Stress Images:  Normal homogeneous uptake in all areas of the myocardium. Rest Images:  Normal homogeneous uptake in all areas of the myocardium. Subtraction (SDS):  No evidence of ischemia. Transient Ischemic Dilatation (Normal <1.22):  0.98 Lung/Heart Ratio (Normal <0.45):  0.24  Quantitative Gated Spect Images QGS EDV:  56 ml QGS ESV:  16 ml  Impression Exercise Capacity:  Fair exercise capacity. BP Response:  Normal blood pressure response. Clinical Symptoms:  No significant symptoms noted. ECG Impression:  No significant ST segment change suggestive of ischemia. Comparison with Prior Nuclear Study: No previous nuclear study performed  Overall Impression:  Low risk stress nuclear study with anterior breast attenuation artifact.  LV Ejection Fraction: 72%.  LV Wall Motion:  NL LV Function; NL Wall Motion   Teresa Lee, FACCEastern Oregon Regional SurgeryrtCare [phone]

## 2013-08-08 ENCOUNTER — Other Ambulatory Visit (INDEPENDENT_AMBULATORY_CARE_PROVIDER_SITE_OTHER): Payer: 59

## 2013-08-08 ENCOUNTER — Ambulatory Visit (HOSPITAL_COMMUNITY): Payer: 59 | Attending: Cardiology | Admitting: Radiology

## 2013-08-08 DIAGNOSIS — R079 Chest pain, unspecified: Secondary | ICD-10-CM | POA: Insufficient documentation

## 2013-08-08 DIAGNOSIS — R002 Palpitations: Secondary | ICD-10-CM

## 2013-08-08 DIAGNOSIS — R0602 Shortness of breath: Secondary | ICD-10-CM

## 2013-08-08 LAB — TSH: TSH: 0.87 u[IU]/mL (ref 0.35–4.50)

## 2013-08-08 LAB — BASIC METABOLIC PANEL
BUN: 23 mg/dL (ref 6–23)
CALCIUM: 9.5 mg/dL (ref 8.4–10.5)
CO2: 31 meq/L (ref 19–32)
CREATININE: 0.8 mg/dL (ref 0.4–1.2)
Chloride: 98 mEq/L (ref 96–112)
GFR: 77.96 mL/min (ref >60.00)
Glucose, Bld: 82 mg/dL (ref 70–99)
Potassium: 4.4 mEq/L (ref 3.5–5.1)
Sodium: 138 mEq/L (ref 135–145)

## 2013-08-08 LAB — LIPID PANEL
CHOL/HDL RATIO: 5
Cholesterol: 288 mg/dL — ABNORMAL HIGH (ref 0–200)
HDL: 60.3 mg/dL (ref >39.00)
LDL CALC: 162 mg/dL — AB (ref 0–99)
Triglycerides: 327 mg/dL — ABNORMAL HIGH (ref 0.0–149.0)
VLDL: 65.4 mg/dL — AB (ref 0.0–40.0)

## 2013-08-08 LAB — T4, FREE: FREE T4: 0.6 ng/dL (ref 0.60–1.60)

## 2013-08-08 NOTE — Progress Notes (Signed)
Echocardiogram performed.  

## 2013-08-12 ENCOUNTER — Ambulatory Visit (HOSPITAL_COMMUNITY): Payer: 59

## 2013-08-20 ENCOUNTER — Ambulatory Visit (HOSPITAL_COMMUNITY)
Admission: RE | Admit: 2013-08-20 | Discharge: 2013-08-20 | Disposition: A | Discharge status: Home / Self Care | Payer: 59 | Source: Ambulatory Visit | Attending: Pain Medicine | Admitting: Pain Medicine

## 2013-08-20 DIAGNOSIS — G894 Chronic pain syndrome: Secondary | ICD-10-CM

## 2013-08-20 DIAGNOSIS — M5030 Other cervical disc degeneration, unspecified cervical region: Secondary | ICD-10-CM | POA: Insufficient documentation

## 2013-08-20 DIAGNOSIS — M5137 Other intervertebral disc degeneration, lumbosacral region: Secondary | ICD-10-CM

## 2013-08-20 DIAGNOSIS — M5020 Other cervical disc displacement, unspecified cervical region: Secondary | ICD-10-CM | POA: Insufficient documentation

## 2013-08-25 ENCOUNTER — Ambulatory Visit (INDEPENDENT_AMBULATORY_CARE_PROVIDER_SITE_OTHER): Payer: 59 | Admitting: Internal Medicine

## 2013-08-25 ENCOUNTER — Encounter (INDEPENDENT_AMBULATORY_CARE_PROVIDER_SITE_OTHER): Payer: Self-pay

## 2013-08-25 ENCOUNTER — Encounter: Payer: Self-pay | Admitting: Internal Medicine

## 2013-08-25 VITALS — BP 110/74 | HR 100 | Ht 60.5 in | Wt 141.2 lb

## 2013-08-25 DIAGNOSIS — R002 Palpitations: Secondary | ICD-10-CM

## 2013-08-25 DIAGNOSIS — E785 Hyperlipidemia, unspecified: Secondary | ICD-10-CM

## 2013-08-25 DIAGNOSIS — I10 Essential (primary) hypertension: Secondary | ICD-10-CM

## 2013-08-25 MED ORDER — FISH OIL 1000 MG PO CAPS: 2000.0000 mg | ORAL_CAPSULE | Freq: Two times a day (BID) | ORAL | Status: DC

## 2013-08-25 NOTE — Progress Notes (Signed)
PCP: Tommy Medal, MD  Teresa Lee is a 53 y.o. female who presents today for routine cardiology followup.  Since last being seen in our clinic, the patient reports doing very well.  She has occasional gradual onset/offset tachycardia.  Today, she denies symptoms of palpitations, chest pain, shortness of breath,  lower extremity edema, dizziness, presyncope, or syncope.  The patient is otherwise without complaint today.   Past Medical History  Diagnosis Date  . Leukocytosis   . Myasthenia gravis   . Hyperlipidemia   . Hypertension   . Asthma   . CTS (carpal tunnel syndrome)   . Herniated nucleus pulposus   . Fibromyalgia   . Arthritis     osteoarthritis  . DJD (degenerative joint disease)     Both cervical spine and LS spine  . Blind left eye   . Heart murmur   . GERD (gastroesophageal reflux disease)   . IBS (irritable bowel syndrome)   . Crohn's disease   . Myasthenia gravis   . Cancer     skin   Past Surgical History  Procedure Laterality Date  . Cholecystectomy    . Thymectomy      due to myastenia gravis  . Carpal tunnel release      bilateral  . Melanoma excision    . Breast reduction surgery    . Endometrial ablation w/ novasure    . Cervical disc inj    . Thymectomy    . Nasal sinus surgery      Current Outpatient Prescriptions  Medication Sig Dispense Refill  . albuterol (PROVENTIL) (2.5 MG/3ML) 0.083% nebulizer solution Take 2.5 mg by nebulization every 6 (six) hours as needed for wheezing.      . Cholecalciferol (VITAMIN D-3) 5000 UNITS TABS Take 1 tablet by mouth daily.      Marland Kitchen desmopressin (DDAVP) 0.1 MG tablet Take 0.1 mg by mouth daily.      . diclofenac sodium (VOLTAREN) 1 % GEL Apply 2 g topically 4 (four) times daily as needed (for back/shoulder pain).      Marland Kitchen estradiol (ESTRACE) 2 MG tablet Take 2 mg by mouth daily.      . famotidine (PEPCID) 20 MG tablet Take 20 mg by mouth at bedtime.      Marland Kitchen FLUoxetine (PROZAC) 40 MG capsule Take 40 mg by mouth  daily.      Marland Kitchen gabapentin (NEURONTIN) 800 MG tablet Take 800 mg by mouth 2 (two) times daily.      Marland Kitchen HYDROcodone-acetaminophen (NORCO) 10-325 MG per tablet Take 1 tablet by mouth every 4 (four) hours as needed.       Marland Kitchen losartan (COZAAR) 50 MG tablet Take 50 mg by mouth daily.      . meloxicam (MOBIC) 15 MG tablet Take 15 mg by mouth daily.      . mesalamine (PENTASA) 500 MG CR capsule Take 1,000 mg by mouth 2 (two) times daily.       . mycophenolate (CELLCEPT) 500 MG tablet Take 500 mg by mouth 2 (two) times daily.       . ondansetron (ZOFRAN-ODT) 4 MG disintegrating tablet Take 4 mg by mouth every 8 (eight) hours as needed for nausea.       . pantoprazole (PROTONIX) 40 MG tablet Take 40 mg by mouth daily.      . potassium chloride (KLOR-CON) 20 MEQ packet Take 10-20 mEq by mouth daily.       . predniSONE (DELTASONE) 5 MG tablet Take 5  mg by mouth daily.      . QUEtiapine (SEROQUEL) 25 MG tablet Take 25 mg by mouth at bedtime. May take 2 tablets at night      . triamterene-hydrochlorothiazide (DYAZIDE) 37.5-25 MG per capsule Take 1 capsule by mouth every morning.       No current facility-administered medications for this visit.    Physical Exam: Filed Vitals:   08/25/13 1403  BP: 110/74  Pulse: 100  Height: 5' 0.5" (1.537 m)  Weight: 141 lb 3.2 oz (64.048 kg)    GEN- The patient is well appearing, alert and oriented x 3 today.   Head- normocephalic, atraumatic Eyes-  Sclera clear, conjunctiva pink Ears- hearing intact Oropharynx- clear Lungs- Clear to ausculation bilaterally, normal work of breathing Heart- Regular rate and rhythm, no murmurs, rubs or gallops, PMI not laterally displaced GI- soft, NT, ND, + BS Extremities- no clubbing, cyanosis, or edema  Holter, echo, and myoview are reviewed  Assessment and Plan:  1. Palpitations holter reveals rare pacs/pvcs Her symptoms are consistent with sinus tachycardia Adequate hydration is encouraged No further workup  planned  2. htn Stable No change required today  3. Hl Per Ysidro Evert, will initiate fishoil 2067m BID Follow-up with JYsidro Evertin the lipid clinic in 3 months  4. SOB Regularly exercise is encouraged She will contact my office if her symptoms worsen  I will see as needed going forward

## 2013-08-25 NOTE — Patient Instructions (Signed)
Your physician has recommended you make the following change in your medication:  1) START FishOil 2000 mg twice daily  Your physician recommends that you schedule a follow-up appointment in: 3 months with Alferd Apa, Pharmacist for lipid management.  Follow up with Dr. Rayann Heman as needed.

## 2013-10-27 ENCOUNTER — Other Ambulatory Visit: Payer: Self-pay | Admitting: Gastroenterology

## 2013-11-25 ENCOUNTER — Ambulatory Visit: Payer: 59 | Admitting: Pharmacist

## 2013-12-02 ENCOUNTER — Ambulatory Visit (INDEPENDENT_AMBULATORY_CARE_PROVIDER_SITE_OTHER): Payer: 59 | Admitting: Pharmacist

## 2013-12-02 DIAGNOSIS — E785 Hyperlipidemia, unspecified: Secondary | ICD-10-CM

## 2013-12-02 LAB — LIPID PANEL
Cholesterol: 208 mg/dL — ABNORMAL HIGH (ref 0–200)
HDL: 40.7 mg/dL (ref >39.00)
NonHDL: 167.3
Total CHOL/HDL Ratio: 5
Triglycerides: 418 mg/dL — ABNORMAL HIGH (ref 0.0–149.0)
VLDL: 83.6 mg/dL — ABNORMAL HIGH (ref 0.0–40.0)

## 2013-12-02 LAB — HEPATIC FUNCTION PANEL
ALK PHOS: 43 U/L (ref 39–117)
ALT: 17 U/L (ref 0–35)
AST: 29 U/L (ref 0–37)
Albumin: 3.5 g/dL (ref 3.5–5.2)
Bilirubin, Direct: 0.2 mg/dL (ref 0.0–0.3)
TOTAL PROTEIN: 7.4 g/dL (ref 6.0–8.3)
Total Bilirubin: 0.9 mg/dL (ref 0.2–1.2)

## 2013-12-02 LAB — LDL CHOLESTEROL, DIRECT: Direct LDL: 98.7 mg/dL

## 2013-12-02 NOTE — Progress Notes (Signed)
12/02/2013 Teresa Lee [birth year] [id number]   HPI:  Teresa Lee is a 53 y.o. female patient of Dr Rayann Heman, with a PMH below who presents today for an initial lipid clinic visit.  Pt denies intolerance/allergy to any HLD drugs (fibrates, fish oil, statins) - has not taken any previously other than current fish oil 2000mg  daily.  Pt has been taking Estrace tablets for over 1 year due to menopausal symptoms.  The Estrace seems to help, but she reports still experience hot flashes.  Pt is also on chronic prednisone for myasthenia gravis and crohn's disease.  Exercise: Pt reports essentially no regular exercise due to fibromyalgia and arthritis. Diet:  Breakfast - sugary cereals, sausage and eggs 1x/week  Lunch - sandwich, mac n cheese  Dinner - varies widely but typically eats out, regular fast food reported  Snacks - often in late afternoon and evenings, pickles, chips, cheese and crackers, ice cream, reports very poor self control in portions EtOH: rare, only on special occasions Tobacco: never user Family Hx: mother had stent placed ~70yo, no pancreatitis, pt is unaware of significant FH but fails to express confidence in knowing full extent Labs: BG 82, TSH 0.87  Current Outpatient Prescriptions  Medication Sig Dispense Refill  . albuterol (PROVENTIL) (2.5 MG/3ML) 0.083% nebulizer solution Take 2.5 mg by nebulization every 6 (six) hours as needed for wheezing.      . Cholecalciferol (VITAMIN D-3) 5000 UNITS TABS Take 1 tablet by mouth daily.      Marland Kitchen desmopressin (DDAVP) 0.1 MG tablet Take 0.1 mg by mouth daily.      . diclofenac sodium (VOLTAREN) 1 % GEL Apply 2 g topically 4 (four) times daily as needed (for back/shoulder pain).      Marland Kitchen estradiol (ESTRACE) 2 MG tablet Take 2 mg by mouth daily.      . famotidine (PEPCID) 20 MG tablet Take 20 mg by mouth at bedtime.      Marland Kitchen FLUoxetine (PROZAC) 40 MG capsule Take 40 mg by mouth daily.      Marland Kitchen gabapentin (NEURONTIN) 800 MG tablet  Take 800 mg by mouth 2 (two) times daily.      Marland Kitchen HYDROcodone-acetaminophen (NORCO) 10-325 MG per tablet Take 1 tablet by mouth every 4 (four) hours as needed.       Marland Kitchen losartan (COZAAR) 50 MG tablet Take 50 mg by mouth daily.      . meloxicam (MOBIC) 15 MG tablet Take 15 mg by mouth daily.      . mesalamine (PENTASA) 500 MG CR capsule Take 1,000 mg by mouth 2 (two) times daily.       . mycophenolate (CELLCEPT) 500 MG tablet Take 500 mg by mouth 2 (two) times daily.       . Omega-3 Fatty Acids (FISH OIL) 1000 MG CAPS Take 2 capsules (2,000 mg total) by mouth 2 (two) times daily.  120 capsule  5  . ondansetron (ZOFRAN-ODT) 4 MG disintegrating tablet Take 4 mg by mouth every 8 (eight) hours as needed for nausea.       . pantoprazole (PROTONIX) 40 MG tablet Take 40 mg by mouth daily.      . potassium chloride (KLOR-CON) 20 MEQ packet Take 10-20 mEq by mouth daily.       . predniSONE (DELTASONE) 5 MG tablet Take 5 mg by mouth daily.      . QUEtiapine (SEROQUEL) 25 MG tablet Take 25 mg by mouth at bedtime. May take 2  tablets at night      . triamterene-hydrochlorothiazide (DYAZIDE) 37.5-25 MG per capsule Take 1 capsule by mouth every morning.       No current facility-administered medications for this visit.    Allergies  Allergen Reactions  . Percocet [Oxycodone-Acetaminophen] Hives  . Sulfamethoxazole-Trimethoprim Hives  . Orange Concentrate [Flavoring Agent]     Acid reflux  . Other     Some nuts cause a rash  . Tomato     Burns skin  . Cefixime     Due to myasthenia gravis    Past Medical History  Diagnosis Date  . Leukocytosis   . Myasthenia gravis   . Hyperlipidemia   . Hypertension   . Asthma   . CTS (carpal tunnel syndrome)   . Herniated nucleus pulposus   . Fibromyalgia   . Arthritis     osteoarthritis  . DJD (degenerative joint disease)     Both cervical spine and LS spine  . Blind left eye   . Heart murmur   . GERD (gastroesophageal reflux disease)   . IBS  (irritable bowel syndrome)   . Crohn's disease   . Myasthenia gravis   . Cancer     skin    Lipid Panel     Component Value Date/Time   CHOL 288* 08/08/2013 0834   TRIG 327.0* 08/08/2013 0834   HDL 60.30 08/08/2013 0834   CHOLHDL 5 08/08/2013 0834   VLDL 65.4* 08/08/2013 0834   LDLCALC 162* 08/08/2013 0834   Lipid Panel     Component Value Date/Time   CHOL 208* 12/02/2013 1032   TRIG 418.0 Triglyceride is over 400; calculations on Lipids are invalid.* 12/02/2013 1032   HDL 40.70 12/02/2013 1032   CHOLHDL 5 12/02/2013 1032   VLDL 83.6* 12/02/2013 1032   LDLCALC 162* 08/08/2013 2919

## 2013-12-02 NOTE — Patient Instructions (Signed)
It was nice to meet you today.  Increase fish oil to 2 capsules twice daily (4000mg  total). Ask gynecologist about switching estrace tablets to an estrogen patch to help lower triglycerides (Vivelle or Climara).  I encourage you to make small changes to help transition to a healthier diet.  Try adding more fruits, vegetables, and nuts instead of crackers and chips.  Look for cereals that are higher in fiber and lower in sugar (Grapenuts, Shredded Wheat, Fiber One) or steel cut oatmeal.  Try to find an activity you can tolerate without causing pain.  I recommend trying water aerobics classes or walking of soft surfaces.  This can help with weight loss, cholesterol levels, and energy levels.  Return in 3 months to check cholesterol and follow-up regarding any medication changes.

## 2013-12-02 NOTE — Assessment & Plan Note (Addendum)
Pt has abnormal lipid panel from most recent check ~40mo ago, with triglycerides significantly elevated at 327.  Pt's poor diet, lack of exercise, prednisone, and Estrace tablets are all likely contributing to elevated triglycerides.  Advised pt to increase fish oil to 4000mg  daily.  Also advised pt to talk to gynecologist about switching Estrace tablets to a patch formulation to help decrease TG without worsening menopausal symptoms.  Encouraged pt to make small changes to help transition to a healthier diet.  Try adding more fruits, vegetables, and nuts instead of crackers and chips.  Look for cereals that are higher in fiber and lower in sugar (Grapenuts, Shredded Wheat, Fiber One) or steel cut oatmeal.  Encouraged pt to try to find an activity she can tolerate without causing pain, such as water aerobics or walking of soft surfaces.  This can help with weight loss, cholesterol levels, and energy levels.  Will check fasting lipid panel today and again before next lipid clinic visit in 3-4 months.  F/U med changes, specifically changes from Estrace tablets to patch formulation.  If TG remain elevated, may consider addition of fenofibrate.  Drucie Opitz, PharmD Clinical Pharmacy Resident Pager: [phone] 12/02/2013 11:42 AM

## 2013-12-09 ENCOUNTER — Telehealth: Payer: Self-pay | Admitting: Internal Medicine

## 2013-12-09 NOTE — Telephone Encounter (Signed)
Follow up:    Per pt calling back for Lab results.   Per pt's verbal, this office can leave detailed message on voice mail.

## 2013-12-09 NOTE — Telephone Encounter (Signed)
Per pt's verbal permission to leave detailed voice mail, called pt and left results of lipid panel including increased triglycerides.  Left verbal instructions reinforcing plan from recent lipid clinic visit (diet, exercise, increasing fish oil, and changing Estrace formulation pending gynecology visit).  Instructed pt to call back if any questions or concerns.

## 2013-12-25 ENCOUNTER — Other Ambulatory Visit: Payer: Self-pay | Admitting: Obstetrics & Gynecology

## 2013-12-26 LAB — CYTOLOGY - PAP

## 2014-03-19 ENCOUNTER — Other Ambulatory Visit: Payer: 59

## 2014-03-24 ENCOUNTER — Ambulatory Visit: Payer: 59 | Admitting: Pharmacist

## 2014-07-29 DIAGNOSIS — R350 Frequency of micturition: Secondary | ICD-10-CM | POA: Diagnosis not present

## 2014-07-29 DIAGNOSIS — E782 Mixed hyperlipidemia: Secondary | ICD-10-CM | POA: Diagnosis not present

## 2014-07-29 DIAGNOSIS — M797 Fibromyalgia: Secondary | ICD-10-CM | POA: Diagnosis not present

## 2014-07-29 DIAGNOSIS — R351 Nocturia: Secondary | ICD-10-CM | POA: Diagnosis not present

## 2014-07-29 DIAGNOSIS — I10 Essential (primary) hypertension: Secondary | ICD-10-CM | POA: Diagnosis not present

## 2014-08-03 DIAGNOSIS — Z0000 Encounter for general adult medical examination without abnormal findings: Secondary | ICD-10-CM | POA: Diagnosis not present

## 2014-08-03 DIAGNOSIS — E538 Deficiency of other specified B group vitamins: Secondary | ICD-10-CM | POA: Diagnosis not present

## 2014-08-03 DIAGNOSIS — M47817 Spondylosis without myelopathy or radiculopathy, lumbosacral region: Secondary | ICD-10-CM | POA: Diagnosis not present

## 2014-08-03 DIAGNOSIS — M797 Fibromyalgia: Secondary | ICD-10-CM | POA: Diagnosis not present

## 2014-08-03 DIAGNOSIS — Z1389 Encounter for screening for other disorder: Secondary | ICD-10-CM | POA: Diagnosis not present

## 2014-08-05 DIAGNOSIS — R351 Nocturia: Secondary | ICD-10-CM | POA: Diagnosis not present

## 2014-08-05 DIAGNOSIS — R350 Frequency of micturition: Secondary | ICD-10-CM | POA: Diagnosis not present

## 2014-08-10 DIAGNOSIS — M792 Neuralgia and neuritis, unspecified: Secondary | ICD-10-CM | POA: Diagnosis not present

## 2014-08-10 DIAGNOSIS — M533 Sacrococcygeal disorders, not elsewhere classified: Secondary | ICD-10-CM | POA: Diagnosis not present

## 2014-08-10 DIAGNOSIS — M797 Fibromyalgia: Secondary | ICD-10-CM | POA: Diagnosis not present

## 2014-08-10 DIAGNOSIS — M4697 Unspecified inflammatory spondylopathy, lumbosacral region: Secondary | ICD-10-CM | POA: Diagnosis not present

## 2014-08-10 DIAGNOSIS — Z79899 Other long term (current) drug therapy: Secondary | ICD-10-CM | POA: Diagnosis not present

## 2014-08-10 DIAGNOSIS — G894 Chronic pain syndrome: Secondary | ICD-10-CM | POA: Diagnosis not present

## 2014-09-01 DIAGNOSIS — K589 Irritable bowel syndrome without diarrhea: Secondary | ICD-10-CM | POA: Diagnosis not present

## 2014-09-01 DIAGNOSIS — K5090 Crohn's disease, unspecified, without complications: Secondary | ICD-10-CM | POA: Diagnosis not present

## 2014-09-01 DIAGNOSIS — R197 Diarrhea, unspecified: Secondary | ICD-10-CM | POA: Diagnosis not present

## 2014-09-07 DIAGNOSIS — G894 Chronic pain syndrome: Secondary | ICD-10-CM | POA: Diagnosis not present

## 2014-09-07 DIAGNOSIS — M4697 Unspecified inflammatory spondylopathy, lumbosacral region: Secondary | ICD-10-CM | POA: Diagnosis not present

## 2014-09-07 DIAGNOSIS — Z79899 Other long term (current) drug therapy: Secondary | ICD-10-CM | POA: Diagnosis not present

## 2014-09-07 DIAGNOSIS — M797 Fibromyalgia: Secondary | ICD-10-CM | POA: Diagnosis not present

## 2014-09-07 DIAGNOSIS — M792 Neuralgia and neuritis, unspecified: Secondary | ICD-10-CM | POA: Diagnosis not present

## 2014-09-22 DIAGNOSIS — R197 Diarrhea, unspecified: Secondary | ICD-10-CM | POA: Diagnosis not present

## 2014-10-07 DIAGNOSIS — M792 Neuralgia and neuritis, unspecified: Secondary | ICD-10-CM | POA: Diagnosis not present

## 2014-10-07 DIAGNOSIS — Z79899 Other long term (current) drug therapy: Secondary | ICD-10-CM | POA: Diagnosis not present

## 2014-10-07 DIAGNOSIS — M4697 Unspecified inflammatory spondylopathy, lumbosacral region: Secondary | ICD-10-CM | POA: Diagnosis not present

## 2014-10-07 DIAGNOSIS — M797 Fibromyalgia: Secondary | ICD-10-CM | POA: Diagnosis not present

## 2014-10-07 DIAGNOSIS — G894 Chronic pain syndrome: Secondary | ICD-10-CM | POA: Diagnosis not present

## 2014-10-26 DIAGNOSIS — E781 Pure hyperglyceridemia: Secondary | ICD-10-CM | POA: Diagnosis not present

## 2014-10-26 DIAGNOSIS — M797 Fibromyalgia: Secondary | ICD-10-CM | POA: Diagnosis not present

## 2014-10-26 DIAGNOSIS — E538 Deficiency of other specified B group vitamins: Secondary | ICD-10-CM | POA: Diagnosis not present

## 2014-10-26 DIAGNOSIS — D649 Anemia, unspecified: Secondary | ICD-10-CM | POA: Diagnosis not present

## 2014-11-02 DIAGNOSIS — I10 Essential (primary) hypertension: Secondary | ICD-10-CM | POA: Diagnosis not present

## 2014-11-02 DIAGNOSIS — E785 Hyperlipidemia, unspecified: Secondary | ICD-10-CM | POA: Diagnosis not present

## 2014-11-02 DIAGNOSIS — F3341 Major depressive disorder, recurrent, in partial remission: Secondary | ICD-10-CM | POA: Diagnosis not present

## 2014-11-02 DIAGNOSIS — E538 Deficiency of other specified B group vitamins: Secondary | ICD-10-CM | POA: Diagnosis not present

## 2014-11-02 DIAGNOSIS — G7000 Myasthenia gravis without (acute) exacerbation: Secondary | ICD-10-CM | POA: Diagnosis not present

## 2014-11-09 DIAGNOSIS — Z79899 Other long term (current) drug therapy: Secondary | ICD-10-CM | POA: Diagnosis not present

## 2014-11-09 DIAGNOSIS — M792 Neuralgia and neuritis, unspecified: Secondary | ICD-10-CM | POA: Diagnosis not present

## 2014-11-09 DIAGNOSIS — M4697 Unspecified inflammatory spondylopathy, lumbosacral region: Secondary | ICD-10-CM | POA: Diagnosis not present

## 2014-11-09 DIAGNOSIS — M797 Fibromyalgia: Secondary | ICD-10-CM | POA: Diagnosis not present

## 2014-11-09 DIAGNOSIS — G894 Chronic pain syndrome: Secondary | ICD-10-CM | POA: Diagnosis not present

## 2014-11-09 DIAGNOSIS — M533 Sacrococcygeal disorders, not elsewhere classified: Secondary | ICD-10-CM | POA: Diagnosis not present

## 2014-12-07 DIAGNOSIS — Z79899 Other long term (current) drug therapy: Secondary | ICD-10-CM | POA: Diagnosis not present

## 2014-12-07 DIAGNOSIS — G894 Chronic pain syndrome: Secondary | ICD-10-CM | POA: Diagnosis not present

## 2014-12-07 DIAGNOSIS — M4697 Unspecified inflammatory spondylopathy, lumbosacral region: Secondary | ICD-10-CM | POA: Diagnosis not present

## 2014-12-28 DIAGNOSIS — J45901 Unspecified asthma with (acute) exacerbation: Secondary | ICD-10-CM | POA: Diagnosis not present

## 2014-12-28 DIAGNOSIS — J40 Bronchitis, not specified as acute or chronic: Secondary | ICD-10-CM | POA: Diagnosis not present

## 2015-01-11 DIAGNOSIS — M4697 Unspecified inflammatory spondylopathy, lumbosacral region: Secondary | ICD-10-CM | POA: Diagnosis not present

## 2015-01-11 DIAGNOSIS — G894 Chronic pain syndrome: Secondary | ICD-10-CM | POA: Diagnosis not present

## 2015-01-11 DIAGNOSIS — M533 Sacrococcygeal disorders, not elsewhere classified: Secondary | ICD-10-CM | POA: Diagnosis not present

## 2015-01-11 DIAGNOSIS — Z79899 Other long term (current) drug therapy: Secondary | ICD-10-CM | POA: Diagnosis not present

## 2015-01-27 DIAGNOSIS — H02402 Unspecified ptosis of left eyelid: Secondary | ICD-10-CM | POA: Diagnosis not present

## 2015-01-27 DIAGNOSIS — Z5181 Encounter for therapeutic drug level monitoring: Secondary | ICD-10-CM | POA: Diagnosis not present

## 2015-01-27 DIAGNOSIS — D72828 Other elevated white blood cell count: Secondary | ICD-10-CM | POA: Diagnosis not present

## 2015-01-27 DIAGNOSIS — Z79899 Other long term (current) drug therapy: Secondary | ICD-10-CM | POA: Diagnosis not present

## 2015-01-27 DIAGNOSIS — Z23 Encounter for immunization: Secondary | ICD-10-CM | POA: Diagnosis not present

## 2015-01-27 DIAGNOSIS — G7000 Myasthenia gravis without (acute) exacerbation: Secondary | ICD-10-CM | POA: Diagnosis not present

## 2015-02-03 DIAGNOSIS — E538 Deficiency of other specified B group vitamins: Secondary | ICD-10-CM | POA: Diagnosis not present

## 2015-02-16 DIAGNOSIS — R351 Nocturia: Secondary | ICD-10-CM | POA: Diagnosis not present

## 2015-02-16 DIAGNOSIS — N3942 Incontinence without sensory awareness: Secondary | ICD-10-CM | POA: Diagnosis not present

## 2015-02-22 DIAGNOSIS — M797 Fibromyalgia: Secondary | ICD-10-CM | POA: Diagnosis not present

## 2015-02-22 DIAGNOSIS — Z79899 Other long term (current) drug therapy: Secondary | ICD-10-CM | POA: Diagnosis not present

## 2015-02-22 DIAGNOSIS — G894 Chronic pain syndrome: Secondary | ICD-10-CM | POA: Diagnosis not present

## 2015-02-22 DIAGNOSIS — M4697 Unspecified inflammatory spondylopathy, lumbosacral region: Secondary | ICD-10-CM | POA: Diagnosis not present

## 2015-02-25 DIAGNOSIS — K589 Irritable bowel syndrome without diarrhea: Secondary | ICD-10-CM | POA: Diagnosis not present

## 2015-02-25 DIAGNOSIS — K5090 Crohn's disease, unspecified, without complications: Secondary | ICD-10-CM | POA: Diagnosis not present

## 2015-04-21 DIAGNOSIS — Z1231 Encounter for screening mammogram for malignant neoplasm of breast: Secondary | ICD-10-CM | POA: Diagnosis not present

## 2015-04-21 DIAGNOSIS — Z01419 Encounter for gynecological examination (general) (routine) without abnormal findings: Secondary | ICD-10-CM | POA: Diagnosis not present

## 2015-04-21 DIAGNOSIS — Z124 Encounter for screening for malignant neoplasm of cervix: Secondary | ICD-10-CM | POA: Diagnosis not present

## 2015-04-21 DIAGNOSIS — Z6827 Body mass index (BMI) 27.0-27.9, adult: Secondary | ICD-10-CM | POA: Diagnosis not present

## 2015-04-28 DIAGNOSIS — E785 Hyperlipidemia, unspecified: Secondary | ICD-10-CM | POA: Diagnosis not present

## 2015-04-28 DIAGNOSIS — R69 Illness, unspecified: Secondary | ICD-10-CM | POA: Diagnosis not present

## 2015-04-28 DIAGNOSIS — I10 Essential (primary) hypertension: Secondary | ICD-10-CM | POA: Diagnosis not present

## 2015-04-28 DIAGNOSIS — G7000 Myasthenia gravis without (acute) exacerbation: Secondary | ICD-10-CM | POA: Diagnosis not present

## 2015-04-28 DIAGNOSIS — E538 Deficiency of other specified B group vitamins: Secondary | ICD-10-CM | POA: Diagnosis not present

## 2015-04-28 DIAGNOSIS — K5010 Crohn's disease of large intestine without complications: Secondary | ICD-10-CM | POA: Diagnosis not present

## 2015-04-28 DIAGNOSIS — Z0000 Encounter for general adult medical examination without abnormal findings: Secondary | ICD-10-CM | POA: Diagnosis not present

## 2015-05-06 DIAGNOSIS — I10 Essential (primary) hypertension: Secondary | ICD-10-CM | POA: Diagnosis not present

## 2015-05-06 DIAGNOSIS — E785 Hyperlipidemia, unspecified: Secondary | ICD-10-CM | POA: Diagnosis not present

## 2015-05-06 DIAGNOSIS — R69 Illness, unspecified: Secondary | ICD-10-CM | POA: Diagnosis not present

## 2015-05-06 DIAGNOSIS — G7000 Myasthenia gravis without (acute) exacerbation: Secondary | ICD-10-CM | POA: Diagnosis not present

## 2015-06-28 DIAGNOSIS — M47817 Spondylosis without myelopathy or radiculopathy, lumbosacral region: Secondary | ICD-10-CM | POA: Diagnosis not present

## 2015-06-28 DIAGNOSIS — M25512 Pain in left shoulder: Secondary | ICD-10-CM | POA: Diagnosis not present

## 2015-06-29 DIAGNOSIS — M545 Low back pain: Secondary | ICD-10-CM | POA: Diagnosis not present

## 2015-06-29 DIAGNOSIS — M6281 Muscle weakness (generalized): Secondary | ICD-10-CM | POA: Diagnosis not present

## 2015-06-29 DIAGNOSIS — G8929 Other chronic pain: Secondary | ICD-10-CM | POA: Diagnosis not present

## 2015-07-07 DIAGNOSIS — M545 Low back pain: Secondary | ICD-10-CM | POA: Diagnosis not present

## 2015-07-07 DIAGNOSIS — G8929 Other chronic pain: Secondary | ICD-10-CM | POA: Diagnosis not present

## 2015-07-07 DIAGNOSIS — M6281 Muscle weakness (generalized): Secondary | ICD-10-CM | POA: Diagnosis not present

## 2015-08-05 DIAGNOSIS — M7989 Other specified soft tissue disorders: Secondary | ICD-10-CM | POA: Diagnosis not present

## 2015-08-05 DIAGNOSIS — G7000 Myasthenia gravis without (acute) exacerbation: Secondary | ICD-10-CM | POA: Diagnosis not present

## 2015-08-05 DIAGNOSIS — M5136 Other intervertebral disc degeneration, lumbar region: Secondary | ICD-10-CM | POA: Diagnosis not present

## 2015-08-05 DIAGNOSIS — M150 Primary generalized (osteo)arthritis: Secondary | ICD-10-CM | POA: Diagnosis not present

## 2015-08-05 DIAGNOSIS — M797 Fibromyalgia: Secondary | ICD-10-CM | POA: Diagnosis not present

## 2015-08-05 DIAGNOSIS — K5080 Crohn's disease of both small and large intestine without complications: Secondary | ICD-10-CM | POA: Diagnosis not present

## 2015-08-13 DIAGNOSIS — M47817 Spondylosis without myelopathy or radiculopathy, lumbosacral region: Secondary | ICD-10-CM | POA: Diagnosis not present

## 2015-08-27 DIAGNOSIS — M47817 Spondylosis without myelopathy or radiculopathy, lumbosacral region: Secondary | ICD-10-CM | POA: Diagnosis not present

## 2015-09-29 DIAGNOSIS — G7000 Myasthenia gravis without (acute) exacerbation: Secondary | ICD-10-CM | POA: Diagnosis not present

## 2015-09-29 DIAGNOSIS — M797 Fibromyalgia: Secondary | ICD-10-CM | POA: Diagnosis not present

## 2015-10-05 DIAGNOSIS — G894 Chronic pain syndrome: Secondary | ICD-10-CM | POA: Diagnosis not present

## 2015-10-05 DIAGNOSIS — M5137 Other intervertebral disc degeneration, lumbosacral region: Secondary | ICD-10-CM | POA: Diagnosis not present

## 2015-10-05 DIAGNOSIS — M4696 Unspecified inflammatory spondylopathy, lumbar region: Secondary | ICD-10-CM | POA: Diagnosis not present

## 2015-10-05 DIAGNOSIS — M25519 Pain in unspecified shoulder: Secondary | ICD-10-CM | POA: Diagnosis not present

## 2015-10-15 DIAGNOSIS — M1288 Other specific arthropathies, not elsewhere classified, other specified site: Secondary | ICD-10-CM | POA: Diagnosis not present

## 2015-10-15 DIAGNOSIS — M4696 Unspecified inflammatory spondylopathy, lumbar region: Secondary | ICD-10-CM | POA: Diagnosis not present

## 2015-10-15 DIAGNOSIS — M5137 Other intervertebral disc degeneration, lumbosacral region: Secondary | ICD-10-CM | POA: Diagnosis not present

## 2015-10-15 DIAGNOSIS — M47817 Spondylosis without myelopathy or radiculopathy, lumbosacral region: Secondary | ICD-10-CM | POA: Diagnosis not present

## 2015-10-19 DIAGNOSIS — M5417 Radiculopathy, lumbosacral region: Secondary | ICD-10-CM | POA: Diagnosis not present

## 2015-10-27 DIAGNOSIS — M25519 Pain in unspecified shoulder: Secondary | ICD-10-CM | POA: Diagnosis not present

## 2015-10-27 DIAGNOSIS — M19019 Primary osteoarthritis, unspecified shoulder: Secondary | ICD-10-CM | POA: Diagnosis not present

## 2015-11-12 DIAGNOSIS — M4696 Unspecified inflammatory spondylopathy, lumbar region: Secondary | ICD-10-CM | POA: Diagnosis not present

## 2015-11-12 DIAGNOSIS — M47817 Spondylosis without myelopathy or radiculopathy, lumbosacral region: Secondary | ICD-10-CM | POA: Diagnosis not present

## 2015-11-12 DIAGNOSIS — M5137 Other intervertebral disc degeneration, lumbosacral region: Secondary | ICD-10-CM | POA: Diagnosis not present

## 2015-11-12 DIAGNOSIS — M1288 Other specific arthropathies, not elsewhere classified, other specified site: Secondary | ICD-10-CM | POA: Diagnosis not present

## 2015-12-10 DIAGNOSIS — M545 Low back pain: Secondary | ICD-10-CM | POA: Diagnosis not present

## 2015-12-10 DIAGNOSIS — M47817 Spondylosis without myelopathy or radiculopathy, lumbosacral region: Secondary | ICD-10-CM | POA: Diagnosis not present

## 2015-12-10 DIAGNOSIS — M79606 Pain in leg, unspecified: Secondary | ICD-10-CM | POA: Diagnosis not present

## 2015-12-10 DIAGNOSIS — M5137 Other intervertebral disc degeneration, lumbosacral region: Secondary | ICD-10-CM | POA: Diagnosis not present

## 2015-12-10 DIAGNOSIS — M4696 Unspecified inflammatory spondylopathy, lumbar region: Secondary | ICD-10-CM | POA: Diagnosis not present

## 2015-12-10 DIAGNOSIS — G894 Chronic pain syndrome: Secondary | ICD-10-CM | POA: Diagnosis not present

## 2015-12-10 DIAGNOSIS — M1288 Other specific arthropathies, not elsewhere classified, other specified site: Secondary | ICD-10-CM | POA: Diagnosis not present

## 2015-12-13 DIAGNOSIS — I10 Essential (primary) hypertension: Secondary | ICD-10-CM | POA: Diagnosis not present

## 2015-12-13 DIAGNOSIS — H5210 Myopia, unspecified eye: Secondary | ICD-10-CM | POA: Diagnosis not present

## 2016-01-01 ENCOUNTER — Ambulatory Visit (INDEPENDENT_AMBULATORY_CARE_PROVIDER_SITE_OTHER): Payer: BC Managed Care – PPO

## 2016-01-01 DIAGNOSIS — Z23 Encounter for immunization: Secondary | ICD-10-CM | POA: Diagnosis not present

## 2016-01-07 DIAGNOSIS — M5137 Other intervertebral disc degeneration, lumbosacral region: Secondary | ICD-10-CM | POA: Diagnosis not present

## 2016-01-07 DIAGNOSIS — M47817 Spondylosis without myelopathy or radiculopathy, lumbosacral region: Secondary | ICD-10-CM | POA: Diagnosis not present

## 2016-01-07 DIAGNOSIS — G894 Chronic pain syndrome: Secondary | ICD-10-CM | POA: Diagnosis not present

## 2016-01-07 DIAGNOSIS — M4696 Unspecified inflammatory spondylopathy, lumbar region: Secondary | ICD-10-CM | POA: Diagnosis not present

## 2016-02-07 DIAGNOSIS — G894 Chronic pain syndrome: Secondary | ICD-10-CM | POA: Diagnosis not present

## 2016-02-07 DIAGNOSIS — M25519 Pain in unspecified shoulder: Secondary | ICD-10-CM | POA: Diagnosis not present

## 2016-02-07 DIAGNOSIS — M47817 Spondylosis without myelopathy or radiculopathy, lumbosacral region: Secondary | ICD-10-CM | POA: Diagnosis not present

## 2016-02-07 DIAGNOSIS — M5137 Other intervertebral disc degeneration, lumbosacral region: Secondary | ICD-10-CM | POA: Diagnosis not present

## 2016-02-09 DIAGNOSIS — Z8349 Family history of other endocrine, nutritional and metabolic diseases: Secondary | ICD-10-CM | POA: Diagnosis not present

## 2016-02-09 DIAGNOSIS — G7000 Myasthenia gravis without (acute) exacerbation: Secondary | ICD-10-CM | POA: Diagnosis not present

## 2016-02-15 DIAGNOSIS — R200 Anesthesia of skin: Secondary | ICD-10-CM | POA: Diagnosis not present

## 2016-02-15 DIAGNOSIS — M5412 Radiculopathy, cervical region: Secondary | ICD-10-CM | POA: Diagnosis not present

## 2016-02-16 DIAGNOSIS — M19019 Primary osteoarthritis, unspecified shoulder: Secondary | ICD-10-CM | POA: Diagnosis not present

## 2016-02-29 DIAGNOSIS — E538 Deficiency of other specified B group vitamins: Secondary | ICD-10-CM | POA: Diagnosis not present

## 2016-03-01 DIAGNOSIS — R351 Nocturia: Secondary | ICD-10-CM | POA: Diagnosis not present

## 2016-03-06 DIAGNOSIS — Z8349 Family history of other endocrine, nutritional and metabolic diseases: Secondary | ICD-10-CM | POA: Diagnosis not present

## 2016-03-06 DIAGNOSIS — K529 Noninfective gastroenteritis and colitis, unspecified: Secondary | ICD-10-CM | POA: Diagnosis not present

## 2016-03-07 DIAGNOSIS — M5137 Other intervertebral disc degeneration, lumbosacral region: Secondary | ICD-10-CM | POA: Diagnosis not present

## 2016-03-07 DIAGNOSIS — M47817 Spondylosis without myelopathy or radiculopathy, lumbosacral region: Secondary | ICD-10-CM | POA: Diagnosis not present

## 2016-03-07 DIAGNOSIS — Z79899 Other long term (current) drug therapy: Secondary | ICD-10-CM | POA: Diagnosis not present

## 2016-03-07 DIAGNOSIS — Z79891 Long term (current) use of opiate analgesic: Secondary | ICD-10-CM | POA: Diagnosis not present

## 2016-03-07 DIAGNOSIS — G894 Chronic pain syndrome: Secondary | ICD-10-CM | POA: Diagnosis not present

## 2016-03-07 DIAGNOSIS — M25519 Pain in unspecified shoulder: Secondary | ICD-10-CM | POA: Diagnosis not present

## 2016-03-30 DIAGNOSIS — K589 Irritable bowel syndrome without diarrhea: Secondary | ICD-10-CM | POA: Diagnosis not present

## 2016-03-30 DIAGNOSIS — K5090 Crohn's disease, unspecified, without complications: Secondary | ICD-10-CM | POA: Diagnosis not present

## 2016-04-04 DIAGNOSIS — G894 Chronic pain syndrome: Secondary | ICD-10-CM | POA: Diagnosis not present

## 2016-04-04 DIAGNOSIS — M47812 Spondylosis without myelopathy or radiculopathy, cervical region: Secondary | ICD-10-CM | POA: Diagnosis not present

## 2016-04-04 DIAGNOSIS — G8929 Other chronic pain: Secondary | ICD-10-CM | POA: Diagnosis not present

## 2016-04-04 DIAGNOSIS — Z79899 Other long term (current) drug therapy: Secondary | ICD-10-CM | POA: Diagnosis not present

## 2016-04-04 DIAGNOSIS — M25519 Pain in unspecified shoulder: Secondary | ICD-10-CM | POA: Diagnosis not present

## 2016-04-04 DIAGNOSIS — M545 Low back pain: Secondary | ICD-10-CM | POA: Diagnosis not present

## 2016-04-04 DIAGNOSIS — Z79891 Long term (current) use of opiate analgesic: Secondary | ICD-10-CM | POA: Diagnosis not present

## 2016-05-02 DIAGNOSIS — M47812 Spondylosis without myelopathy or radiculopathy, cervical region: Secondary | ICD-10-CM | POA: Diagnosis not present

## 2016-05-02 DIAGNOSIS — M545 Low back pain: Secondary | ICD-10-CM | POA: Diagnosis not present

## 2016-05-02 DIAGNOSIS — Z79891 Long term (current) use of opiate analgesic: Secondary | ICD-10-CM | POA: Diagnosis not present

## 2016-05-02 DIAGNOSIS — M542 Cervicalgia: Secondary | ICD-10-CM | POA: Diagnosis not present

## 2016-05-02 DIAGNOSIS — G894 Chronic pain syndrome: Secondary | ICD-10-CM | POA: Diagnosis not present

## 2016-05-02 DIAGNOSIS — Z79899 Other long term (current) drug therapy: Secondary | ICD-10-CM | POA: Diagnosis not present

## 2016-05-02 DIAGNOSIS — M25519 Pain in unspecified shoulder: Secondary | ICD-10-CM | POA: Diagnosis not present

## 2016-05-03 DIAGNOSIS — I10 Essential (primary) hypertension: Secondary | ICD-10-CM | POA: Diagnosis not present

## 2016-05-03 DIAGNOSIS — Z0000 Encounter for general adult medical examination without abnormal findings: Secondary | ICD-10-CM | POA: Diagnosis not present

## 2016-05-03 DIAGNOSIS — E538 Deficiency of other specified B group vitamins: Secondary | ICD-10-CM | POA: Diagnosis not present

## 2016-05-03 DIAGNOSIS — E785 Hyperlipidemia, unspecified: Secondary | ICD-10-CM | POA: Diagnosis not present

## 2016-05-09 DIAGNOSIS — K5010 Crohn's disease of large intestine without complications: Secondary | ICD-10-CM | POA: Diagnosis not present

## 2016-05-09 DIAGNOSIS — E538 Deficiency of other specified B group vitamins: Secondary | ICD-10-CM | POA: Diagnosis not present

## 2016-05-09 DIAGNOSIS — Z0000 Encounter for general adult medical examination without abnormal findings: Secondary | ICD-10-CM | POA: Diagnosis not present

## 2016-05-09 DIAGNOSIS — R69 Illness, unspecified: Secondary | ICD-10-CM | POA: Diagnosis not present

## 2016-05-09 DIAGNOSIS — G7000 Myasthenia gravis without (acute) exacerbation: Secondary | ICD-10-CM | POA: Diagnosis not present

## 2016-05-12 DIAGNOSIS — J4520 Mild intermittent asthma, uncomplicated: Secondary | ICD-10-CM | POA: Diagnosis not present

## 2016-05-12 DIAGNOSIS — J45909 Unspecified asthma, uncomplicated: Secondary | ICD-10-CM | POA: Diagnosis not present

## 2016-05-16 DIAGNOSIS — J209 Acute bronchitis, unspecified: Secondary | ICD-10-CM | POA: Diagnosis not present

## 2016-05-30 DIAGNOSIS — M25519 Pain in unspecified shoulder: Secondary | ICD-10-CM | POA: Diagnosis not present

## 2016-05-30 DIAGNOSIS — M47817 Spondylosis without myelopathy or radiculopathy, lumbosacral region: Secondary | ICD-10-CM | POA: Diagnosis not present

## 2016-05-30 DIAGNOSIS — M5137 Other intervertebral disc degeneration, lumbosacral region: Secondary | ICD-10-CM | POA: Diagnosis not present

## 2016-05-30 DIAGNOSIS — Z79891 Long term (current) use of opiate analgesic: Secondary | ICD-10-CM | POA: Diagnosis not present

## 2016-05-30 DIAGNOSIS — Z79899 Other long term (current) drug therapy: Secondary | ICD-10-CM | POA: Diagnosis not present

## 2016-05-30 DIAGNOSIS — G894 Chronic pain syndrome: Secondary | ICD-10-CM | POA: Diagnosis not present

## 2016-06-15 DIAGNOSIS — Z124 Encounter for screening for malignant neoplasm of cervix: Secondary | ICD-10-CM | POA: Diagnosis not present

## 2016-06-15 DIAGNOSIS — Z6824 Body mass index (BMI) 24.0-24.9, adult: Secondary | ICD-10-CM | POA: Diagnosis not present

## 2016-06-15 DIAGNOSIS — N950 Postmenopausal bleeding: Secondary | ICD-10-CM | POA: Diagnosis not present

## 2016-06-15 DIAGNOSIS — N390 Urinary tract infection, site not specified: Secondary | ICD-10-CM | POA: Diagnosis not present

## 2016-06-15 DIAGNOSIS — Z01419 Encounter for gynecological examination (general) (routine) without abnormal findings: Secondary | ICD-10-CM | POA: Diagnosis not present

## 2016-06-15 DIAGNOSIS — Z1231 Encounter for screening mammogram for malignant neoplasm of breast: Secondary | ICD-10-CM | POA: Diagnosis not present

## 2016-06-15 DIAGNOSIS — N760 Acute vaginitis: Secondary | ICD-10-CM | POA: Diagnosis not present

## 2016-06-28 ENCOUNTER — Other Ambulatory Visit: Payer: Self-pay | Admitting: Pain Medicine

## 2016-06-28 DIAGNOSIS — M542 Cervicalgia: Secondary | ICD-10-CM | POA: Diagnosis not present

## 2016-06-28 DIAGNOSIS — M545 Low back pain: Secondary | ICD-10-CM

## 2016-06-28 DIAGNOSIS — Z79891 Long term (current) use of opiate analgesic: Secondary | ICD-10-CM | POA: Diagnosis not present

## 2016-06-28 DIAGNOSIS — M47812 Spondylosis without myelopathy or radiculopathy, cervical region: Secondary | ICD-10-CM | POA: Diagnosis not present

## 2016-06-28 DIAGNOSIS — G894 Chronic pain syndrome: Secondary | ICD-10-CM | POA: Diagnosis not present

## 2016-06-28 DIAGNOSIS — M47816 Spondylosis without myelopathy or radiculopathy, lumbar region: Secondary | ICD-10-CM | POA: Diagnosis not present

## 2016-06-28 DIAGNOSIS — Z79899 Other long term (current) drug therapy: Secondary | ICD-10-CM | POA: Diagnosis not present

## 2016-07-15 ENCOUNTER — Ambulatory Visit
Admission: RE | Admit: 2016-07-15 | Discharge: 2016-07-15 | Disposition: A | Discharge status: Home / Self Care | Payer: 59 | Source: Ambulatory Visit | Attending: Pain Medicine | Admitting: Pain Medicine

## 2016-07-15 DIAGNOSIS — M545 Low back pain: Secondary | ICD-10-CM

## 2016-07-15 DIAGNOSIS — M48061 Spinal stenosis, lumbar region without neurogenic claudication: Secondary | ICD-10-CM | POA: Diagnosis not present

## 2016-07-27 DIAGNOSIS — Z79891 Long term (current) use of opiate analgesic: Secondary | ICD-10-CM | POA: Diagnosis not present

## 2016-07-27 DIAGNOSIS — M542 Cervicalgia: Secondary | ICD-10-CM | POA: Diagnosis not present

## 2016-07-27 DIAGNOSIS — G894 Chronic pain syndrome: Secondary | ICD-10-CM | POA: Diagnosis not present

## 2016-07-27 DIAGNOSIS — M47816 Spondylosis without myelopathy or radiculopathy, lumbar region: Secondary | ICD-10-CM | POA: Diagnosis not present

## 2016-07-27 DIAGNOSIS — M47812 Spondylosis without myelopathy or radiculopathy, cervical region: Secondary | ICD-10-CM | POA: Diagnosis not present

## 2016-07-27 DIAGNOSIS — Z79899 Other long term (current) drug therapy: Secondary | ICD-10-CM | POA: Diagnosis not present

## 2016-07-31 DIAGNOSIS — M5136 Other intervertebral disc degeneration, lumbar region: Secondary | ICD-10-CM | POA: Diagnosis not present

## 2016-07-31 DIAGNOSIS — M5417 Radiculopathy, lumbosacral region: Secondary | ICD-10-CM | POA: Diagnosis not present

## 2016-07-31 DIAGNOSIS — M47817 Spondylosis without myelopathy or radiculopathy, lumbosacral region: Secondary | ICD-10-CM | POA: Diagnosis not present

## 2016-08-10 DIAGNOSIS — G894 Chronic pain syndrome: Secondary | ICD-10-CM | POA: Diagnosis not present

## 2016-08-10 DIAGNOSIS — Z79899 Other long term (current) drug therapy: Secondary | ICD-10-CM | POA: Diagnosis not present

## 2016-08-10 DIAGNOSIS — M542 Cervicalgia: Secondary | ICD-10-CM | POA: Diagnosis not present

## 2016-08-10 DIAGNOSIS — Z79891 Long term (current) use of opiate analgesic: Secondary | ICD-10-CM | POA: Diagnosis not present

## 2016-08-10 DIAGNOSIS — M47816 Spondylosis without myelopathy or radiculopathy, lumbar region: Secondary | ICD-10-CM | POA: Diagnosis not present

## 2016-08-10 DIAGNOSIS — E538 Deficiency of other specified B group vitamins: Secondary | ICD-10-CM | POA: Diagnosis not present

## 2016-08-10 DIAGNOSIS — M47812 Spondylosis without myelopathy or radiculopathy, cervical region: Secondary | ICD-10-CM | POA: Diagnosis not present

## 2016-08-15 DIAGNOSIS — R69 Illness, unspecified: Secondary | ICD-10-CM | POA: Diagnosis not present

## 2016-08-23 DIAGNOSIS — G7000 Myasthenia gravis without (acute) exacerbation: Secondary | ICD-10-CM | POA: Diagnosis not present

## 2016-08-23 DIAGNOSIS — I10 Essential (primary) hypertension: Secondary | ICD-10-CM | POA: Diagnosis not present

## 2016-08-23 DIAGNOSIS — E876 Hypokalemia: Secondary | ICD-10-CM | POA: Diagnosis not present

## 2016-08-23 DIAGNOSIS — N3281 Overactive bladder: Secondary | ICD-10-CM | POA: Diagnosis not present

## 2016-08-23 DIAGNOSIS — Z6829 Body mass index (BMI) 29.0-29.9, adult: Secondary | ICD-10-CM | POA: Diagnosis not present

## 2016-08-23 DIAGNOSIS — Z79891 Long term (current) use of opiate analgesic: Secondary | ICD-10-CM | POA: Diagnosis not present

## 2016-08-23 DIAGNOSIS — M130 Polyarthritis, unspecified: Secondary | ICD-10-CM | POA: Diagnosis not present

## 2016-08-23 DIAGNOSIS — G4700 Insomnia, unspecified: Secondary | ICD-10-CM | POA: Diagnosis not present

## 2016-08-23 DIAGNOSIS — M151 Heberden's nodes (with arthropathy): Secondary | ICD-10-CM | POA: Diagnosis not present

## 2016-08-23 DIAGNOSIS — G2581 Restless legs syndrome: Secondary | ICD-10-CM | POA: Diagnosis not present

## 2016-08-23 DIAGNOSIS — R110 Nausea: Secondary | ICD-10-CM | POA: Diagnosis not present

## 2016-08-23 DIAGNOSIS — R0602 Shortness of breath: Secondary | ICD-10-CM | POA: Diagnosis not present

## 2016-08-23 DIAGNOSIS — Z791 Long term (current) use of non-steroidal anti-inflammatories (NSAID): Secondary | ICD-10-CM | POA: Diagnosis not present

## 2016-08-23 DIAGNOSIS — K5080 Crohn's disease of both small and large intestine without complications: Secondary | ICD-10-CM | POA: Diagnosis not present

## 2016-08-23 DIAGNOSIS — K219 Gastro-esophageal reflux disease without esophagitis: Secondary | ICD-10-CM | POA: Diagnosis not present

## 2016-08-23 DIAGNOSIS — N951 Menopausal and female climacteric states: Secondary | ICD-10-CM | POA: Diagnosis not present

## 2016-08-23 DIAGNOSIS — Z0000 Encounter for general adult medical examination without abnormal findings: Secondary | ICD-10-CM | POA: Diagnosis not present

## 2016-08-23 DIAGNOSIS — Z79890 Hormone replacement therapy (postmenopausal): Secondary | ICD-10-CM | POA: Diagnosis not present

## 2016-08-23 DIAGNOSIS — Z7952 Long term (current) use of systemic steroids: Secondary | ICD-10-CM | POA: Diagnosis not present

## 2016-08-28 DIAGNOSIS — M5136 Other intervertebral disc degeneration, lumbar region: Secondary | ICD-10-CM | POA: Diagnosis not present

## 2016-08-28 DIAGNOSIS — M47817 Spondylosis without myelopathy or radiculopathy, lumbosacral region: Secondary | ICD-10-CM | POA: Diagnosis not present

## 2016-08-28 DIAGNOSIS — G894 Chronic pain syndrome: Secondary | ICD-10-CM | POA: Diagnosis not present

## 2016-08-28 DIAGNOSIS — M545 Low back pain: Secondary | ICD-10-CM | POA: Diagnosis not present

## 2016-09-11 DIAGNOSIS — M47817 Spondylosis without myelopathy or radiculopathy, lumbosacral region: Secondary | ICD-10-CM | POA: Diagnosis not present

## 2016-09-11 DIAGNOSIS — M5417 Radiculopathy, lumbosacral region: Secondary | ICD-10-CM | POA: Diagnosis not present

## 2016-09-11 DIAGNOSIS — G894 Chronic pain syndrome: Secondary | ICD-10-CM | POA: Diagnosis not present

## 2016-09-11 DIAGNOSIS — M5136 Other intervertebral disc degeneration, lumbar region: Secondary | ICD-10-CM | POA: Diagnosis not present

## 2016-09-26 DIAGNOSIS — M47812 Spondylosis without myelopathy or radiculopathy, cervical region: Secondary | ICD-10-CM | POA: Diagnosis not present

## 2016-09-26 DIAGNOSIS — G894 Chronic pain syndrome: Secondary | ICD-10-CM | POA: Diagnosis not present

## 2016-09-26 DIAGNOSIS — M47816 Spondylosis without myelopathy or radiculopathy, lumbar region: Secondary | ICD-10-CM | POA: Diagnosis not present

## 2016-09-26 DIAGNOSIS — M542 Cervicalgia: Secondary | ICD-10-CM | POA: Diagnosis not present

## 2016-09-26 DIAGNOSIS — Z79891 Long term (current) use of opiate analgesic: Secondary | ICD-10-CM | POA: Diagnosis not present

## 2016-09-26 DIAGNOSIS — M25511 Pain in right shoulder: Secondary | ICD-10-CM | POA: Diagnosis not present

## 2016-09-26 DIAGNOSIS — Z79899 Other long term (current) drug therapy: Secondary | ICD-10-CM | POA: Diagnosis not present

## 2016-10-02 DIAGNOSIS — M47812 Spondylosis without myelopathy or radiculopathy, cervical region: Secondary | ICD-10-CM | POA: Diagnosis not present

## 2016-10-04 DIAGNOSIS — D692 Other nonthrombocytopenic purpura: Secondary | ICD-10-CM | POA: Diagnosis not present

## 2016-10-04 DIAGNOSIS — L570 Actinic keratosis: Secondary | ICD-10-CM | POA: Diagnosis not present

## 2016-10-04 DIAGNOSIS — D485 Neoplasm of uncertain behavior of skin: Secondary | ICD-10-CM | POA: Diagnosis not present

## 2016-10-04 DIAGNOSIS — D2272 Melanocytic nevi of left lower limb, including hip: Secondary | ICD-10-CM | POA: Diagnosis not present

## 2016-10-04 DIAGNOSIS — D225 Melanocytic nevi of trunk: Secondary | ICD-10-CM | POA: Diagnosis not present

## 2016-10-04 DIAGNOSIS — D2271 Melanocytic nevi of right lower limb, including hip: Secondary | ICD-10-CM | POA: Diagnosis not present

## 2016-10-04 DIAGNOSIS — Z85820 Personal history of malignant melanoma of skin: Secondary | ICD-10-CM | POA: Diagnosis not present

## 2016-10-04 DIAGNOSIS — L821 Other seborrheic keratosis: Secondary | ICD-10-CM | POA: Diagnosis not present

## 2016-10-19 DIAGNOSIS — D225 Melanocytic nevi of trunk: Secondary | ICD-10-CM | POA: Diagnosis not present

## 2016-10-19 DIAGNOSIS — Z85820 Personal history of malignant melanoma of skin: Secondary | ICD-10-CM | POA: Diagnosis not present

## 2016-10-19 DIAGNOSIS — D485 Neoplasm of uncertain behavior of skin: Secondary | ICD-10-CM | POA: Diagnosis not present

## 2016-10-21 ENCOUNTER — Encounter (HOSPITAL_COMMUNITY): Payer: Self-pay | Admitting: *Deleted

## 2016-10-21 ENCOUNTER — Inpatient Hospital Stay (HOSPITAL_COMMUNITY)
Admission: EM | Admit: 2016-10-21 | Discharge: 2016-10-28 | DRG: 862 | Disposition: A | Discharge status: Home / Self Care | Payer: Medicare HMO | Attending: Internal Medicine | Admitting: Internal Medicine

## 2016-10-21 ENCOUNTER — Inpatient Hospital Stay (HOSPITAL_COMMUNITY): Payer: Medicare HMO

## 2016-10-21 DIAGNOSIS — M1990 Unspecified osteoarthritis, unspecified site: Secondary | ICD-10-CM | POA: Diagnosis present

## 2016-10-21 DIAGNOSIS — M797 Fibromyalgia: Secondary | ICD-10-CM | POA: Diagnosis present

## 2016-10-21 DIAGNOSIS — A419 Sepsis, unspecified organism: Secondary | ICD-10-CM | POA: Diagnosis not present

## 2016-10-21 DIAGNOSIS — T814XXA Infection following a procedure, initial encounter: Principal | ICD-10-CM | POA: Diagnosis present

## 2016-10-21 DIAGNOSIS — R718 Other abnormality of red blood cells: Secondary | ICD-10-CM | POA: Diagnosis present

## 2016-10-21 DIAGNOSIS — IMO0001 Reserved for inherently not codable concepts without codable children: Secondary | ICD-10-CM

## 2016-10-21 DIAGNOSIS — I340 Nonrheumatic mitral (valve) insufficiency: Secondary | ICD-10-CM | POA: Diagnosis not present

## 2016-10-21 DIAGNOSIS — G7000 Myasthenia gravis without (acute) exacerbation: Secondary | ICD-10-CM | POA: Diagnosis not present

## 2016-10-21 DIAGNOSIS — I10 Essential (primary) hypertension: Secondary | ICD-10-CM | POA: Diagnosis not present

## 2016-10-21 DIAGNOSIS — L0390 Cellulitis, unspecified: Secondary | ICD-10-CM | POA: Diagnosis present

## 2016-10-21 DIAGNOSIS — E876 Hypokalemia: Secondary | ICD-10-CM | POA: Diagnosis not present

## 2016-10-21 DIAGNOSIS — L03312 Cellulitis of back [any part except buttock]: Secondary | ICD-10-CM | POA: Diagnosis not present

## 2016-10-21 DIAGNOSIS — C4359 Malignant melanoma of other part of trunk: Secondary | ICD-10-CM | POA: Diagnosis not present

## 2016-10-21 DIAGNOSIS — J45909 Unspecified asthma, uncomplicated: Secondary | ICD-10-CM | POA: Diagnosis present

## 2016-10-21 DIAGNOSIS — R0602 Shortness of breath: Secondary | ICD-10-CM

## 2016-10-21 DIAGNOSIS — D72825 Bandemia: Secondary | ICD-10-CM | POA: Diagnosis not present

## 2016-10-21 DIAGNOSIS — G2581 Restless legs syndrome: Secondary | ICD-10-CM | POA: Diagnosis present

## 2016-10-21 DIAGNOSIS — I361 Nonrheumatic tricuspid (valve) insufficiency: Secondary | ICD-10-CM | POA: Diagnosis not present

## 2016-10-21 DIAGNOSIS — E871 Hypo-osmolality and hyponatremia: Secondary | ICD-10-CM | POA: Diagnosis present

## 2016-10-21 DIAGNOSIS — K219 Gastro-esophageal reflux disease without esophagitis: Secondary | ICD-10-CM | POA: Diagnosis present

## 2016-10-21 DIAGNOSIS — Z79899 Other long term (current) drug therapy: Secondary | ICD-10-CM | POA: Diagnosis not present

## 2016-10-21 DIAGNOSIS — T8149XA Infection following a procedure, other surgical site, initial encounter: Secondary | ICD-10-CM | POA: Diagnosis present

## 2016-10-21 DIAGNOSIS — Y838 Other surgical procedures as the cause of abnormal reaction of the patient, or of later complication, without mention of misadventure at the time of the procedure: Secondary | ICD-10-CM | POA: Diagnosis present

## 2016-10-21 DIAGNOSIS — I509 Heart failure, unspecified: Secondary | ICD-10-CM | POA: Diagnosis present

## 2016-10-21 DIAGNOSIS — K5090 Crohn's disease, unspecified, without complications: Secondary | ICD-10-CM | POA: Diagnosis not present

## 2016-10-21 DIAGNOSIS — E785 Hyperlipidemia, unspecified: Secondary | ICD-10-CM | POA: Diagnosis present

## 2016-10-21 DIAGNOSIS — D72829 Elevated white blood cell count, unspecified: Secondary | ICD-10-CM | POA: Diagnosis present

## 2016-10-21 DIAGNOSIS — K589 Irritable bowel syndrome without diarrhea: Secondary | ICD-10-CM | POA: Diagnosis present

## 2016-10-21 DIAGNOSIS — N179 Acute kidney failure, unspecified: Secondary | ICD-10-CM | POA: Diagnosis present

## 2016-10-21 DIAGNOSIS — I110 Hypertensive heart disease with heart failure: Secondary | ICD-10-CM | POA: Diagnosis present

## 2016-10-21 DIAGNOSIS — J984 Other disorders of lung: Secondary | ICD-10-CM | POA: Diagnosis not present

## 2016-10-21 DIAGNOSIS — J4520 Mild intermittent asthma, uncomplicated: Secondary | ICD-10-CM | POA: Diagnosis not present

## 2016-10-21 DIAGNOSIS — R9431 Abnormal electrocardiogram [ECG] [EKG]: Secondary | ICD-10-CM | POA: Diagnosis not present

## 2016-10-21 DIAGNOSIS — C439 Malignant melanoma of skin, unspecified: Secondary | ICD-10-CM | POA: Diagnosis present

## 2016-10-21 LAB — BASIC METABOLIC PANEL
Anion gap: 13 (ref 5–15)
BUN: 14 mg/dL (ref 6–20)
CO2: 24 mmol/L (ref 22–32)
Calcium: 8.7 mg/dL — ABNORMAL LOW (ref 8.9–10.3)
Chloride: 99 mmol/L — ABNORMAL LOW (ref 101–111)
Creatinine, Ser: 0.79 mg/dL (ref 0.44–1.00)
GLUCOSE: 128 mg/dL — AB (ref 65–99)
POTASSIUM: 2.8 mmol/L — AB (ref 3.5–5.1)
SODIUM: 136 mmol/L (ref 135–145)

## 2016-10-21 LAB — CBC WITH DIFFERENTIAL/PLATELET
Basophils Absolute: 0.1 10*3/uL (ref 0.0–0.1)
Basophils Relative: 0 %
EOS PCT: 1 %
Eosinophils Absolute: 0.3 10*3/uL (ref 0.0–0.7)
HCT: 34.7 % — ABNORMAL LOW (ref 36.0–46.0)
Hemoglobin: 11.9 g/dL — ABNORMAL LOW (ref 12.0–15.0)
LYMPHS ABS: 1.6 10*3/uL (ref 0.7–4.0)
Lymphocytes Relative: 6 %
MCH: 32.3 pg (ref 26.0–34.0)
MCHC: 34.3 g/dL (ref 30.0–36.0)
MCV: 94.3 fL (ref 78.0–100.0)
Monocytes Absolute: 1.8 10*3/uL — ABNORMAL HIGH (ref 0.1–1.0)
Monocytes Relative: 7 %
NEUTROS PCT: 86 %
Neutro Abs: 20.8 10*3/uL — ABNORMAL HIGH (ref 1.7–7.7)
PLATELETS: 214 10*3/uL (ref 150–400)
RBC: 3.68 MIL/uL — AB (ref 3.87–5.11)
RDW: 14 % (ref 11.5–15.5)
WBC: 24.6 10*3/uL — ABNORMAL HIGH (ref 4.0–10.5)

## 2016-10-21 LAB — I-STAT CG4 LACTIC ACID, ED: Lactic Acid, Venous: 1.65 mmol/L (ref 0.5–1.9)

## 2016-10-21 LAB — PHOSPHORUS: PHOSPHORUS: 2.6 mg/dL (ref 2.5–4.6)

## 2016-10-21 LAB — MAGNESIUM: MAGNESIUM: 1.2 mg/dL — AB (ref 1.7–2.4)

## 2016-10-21 MED ORDER — POTASSIUM CHLORIDE CRYS ER 20 MEQ PO TBCR
40.0000 meq | EXTENDED_RELEASE_TABLET | Freq: Once | ORAL | Status: AC
  Administered 2016-10-21: 40 meq via ORAL
  Filled 2016-10-21: qty 2

## 2016-10-21 MED ORDER — ACETAMINOPHEN 650 MG RE SUPP: 650.0000 mg | Freq: Four times a day (QID) | RECTAL | Status: DC | PRN

## 2016-10-21 MED ORDER — ROPINIROLE HCL 1 MG PO TABS
1.0000 mg | ORAL_TABLET | Freq: Every day | ORAL | Status: DC
  Administered 2016-10-22 – 2016-10-27 (×7): 1 mg via ORAL
  Filled 2016-10-21 (×6): qty 1
  Filled 2016-10-21: qty 2

## 2016-10-21 MED ORDER — PANTOPRAZOLE SODIUM 40 MG PO TBEC
40.0000 mg | DELAYED_RELEASE_TABLET | Freq: Every day | ORAL | Status: DC
  Administered 2016-10-22 – 2016-10-28 (×7): 40 mg via ORAL
  Filled 2016-10-21 (×7): qty 1

## 2016-10-21 MED ORDER — HYDROMORPHONE HCL 1 MG/ML IJ SOLN
0.5000 mg | INTRAMUSCULAR | Status: DC | PRN
  Administered 2016-10-22 (×3): 0.5 mg via INTRAVENOUS
  Filled 2016-10-21 (×4): qty 1

## 2016-10-21 MED ORDER — POTASSIUM CHLORIDE CRYS ER 20 MEQ PO TBCR: 40.0000 meq | EXTENDED_RELEASE_TABLET | Freq: Once | ORAL | Status: DC

## 2016-10-21 MED ORDER — HYDROMORPHONE HCL 1 MG/ML IJ SOLN
1.0000 mg | Freq: Once | INTRAMUSCULAR | Status: AC
  Administered 2016-10-21: 1 mg via INTRAVENOUS
  Filled 2016-10-21: qty 1

## 2016-10-21 MED ORDER — MESALAMINE 400 MG PO CPDR
800.0000 mg | DELAYED_RELEASE_CAPSULE | Freq: Two times a day (BID) | ORAL | Status: DC
  Administered 2016-10-22 – 2016-10-28 (×14): 800 mg via ORAL
  Filled 2016-10-21 (×14): qty 2

## 2016-10-21 MED ORDER — PROGESTERONE MICRONIZED 100 MG PO CAPS
100.0000 mg | ORAL_CAPSULE | Freq: Every day | ORAL | Status: DC
  Administered 2016-10-22 – 2016-10-27 (×7): 100 mg via ORAL
  Filled 2016-10-21 (×7): qty 1

## 2016-10-21 MED ORDER — DESMOPRESSIN ACETATE 0.2 MG PO TABS
0.3000 mg | ORAL_TABLET | Freq: Every day | ORAL | Status: DC
  Administered 2016-10-22 – 2016-10-27 (×6): 0.3 mg via ORAL
  Filled 2016-10-21 (×7): qty 1

## 2016-10-21 MED ORDER — ESTRADIOL 2 MG PO TABS
2.0000 mg | ORAL_TABLET | Freq: Two times a day (BID) | ORAL | Status: DC
  Administered 2016-10-22 – 2016-10-28 (×14): 2 mg via ORAL
  Filled 2016-10-21 (×14): qty 1

## 2016-10-21 MED ORDER — HYDROXYZINE HCL 25 MG PO TABS
25.0000 mg | ORAL_TABLET | Freq: Three times a day (TID) | ORAL | Status: DC | PRN
  Administered 2016-10-22: 25 mg via ORAL
  Filled 2016-10-21 (×2): qty 1

## 2016-10-21 MED ORDER — ONDANSETRON HCL 4 MG/2ML IJ SOLN
4.0000 mg | Freq: Four times a day (QID) | INTRAMUSCULAR | Status: DC | PRN
  Administered 2016-10-22 – 2016-10-25 (×8): 4 mg via INTRAVENOUS
  Filled 2016-10-21 (×10): qty 2

## 2016-10-21 MED ORDER — HYDROMORPHONE HCL 1 MG/ML IJ SOLN
0.5000 mg | Freq: Once | INTRAMUSCULAR | Status: AC
  Administered 2016-10-21: 0.5 mg via INTRAVENOUS
  Filled 2016-10-21: qty 1

## 2016-10-21 MED ORDER — MORPHINE SULFATE (PF) 4 MG/ML IV SOLN
6.0000 mg | Freq: Once | INTRAVENOUS | Status: AC
  Administered 2016-10-21: 6 mg via INTRAVENOUS
  Filled 2016-10-21: qty 2

## 2016-10-21 MED ORDER — FAMOTIDINE 20 MG PO TABS
20.0000 mg | ORAL_TABLET | Freq: Every day | ORAL | Status: DC
  Administered 2016-10-22 – 2016-10-27 (×7): 20 mg via ORAL
  Filled 2016-10-21 (×7): qty 1

## 2016-10-21 MED ORDER — CITALOPRAM HYDROBROMIDE 40 MG PO TABS
40.0000 mg | ORAL_TABLET | Freq: Every day | ORAL | Status: DC
  Administered 2016-10-22 – 2016-10-28 (×7): 40 mg via ORAL
  Filled 2016-10-21 (×7): qty 1

## 2016-10-21 MED ORDER — IOPAMIDOL (ISOVUE-300) INJECTION 61%
INTRAVENOUS | Status: AC
  Administered 2016-10-21: 75 mL via INTRAVENOUS
  Filled 2016-10-21: qty 75

## 2016-10-21 MED ORDER — POLYETHYLENE GLYCOL 3350 17 G PO PACK: 17.0000 g | PACK | Freq: Every day | ORAL | Status: DC | PRN

## 2016-10-21 MED ORDER — ONDANSETRON HCL 4 MG PO TABS
4.0000 mg | ORAL_TABLET | Freq: Four times a day (QID) | ORAL | Status: DC | PRN
  Administered 2016-10-23: 4 mg via ORAL
  Filled 2016-10-21: qty 1

## 2016-10-21 MED ORDER — SENNA 8.6 MG PO TABS
1.0000 | ORAL_TABLET | Freq: Two times a day (BID) | ORAL | Status: DC
  Administered 2016-10-22 – 2016-10-28 (×13): 8.6 mg via ORAL
  Filled 2016-10-21 (×14): qty 1

## 2016-10-21 MED ORDER — ONDANSETRON HCL 4 MG/2ML IJ SOLN
4.0000 mg | Freq: Once | INTRAMUSCULAR | Status: AC
  Administered 2016-10-21: 4 mg via INTRAVENOUS
  Filled 2016-10-21: qty 2

## 2016-10-21 MED ORDER — ACETAMINOPHEN 325 MG PO TABS
650.0000 mg | ORAL_TABLET | Freq: Four times a day (QID) | ORAL | Status: DC | PRN
  Administered 2016-10-23: 650 mg via ORAL
  Filled 2016-10-21: qty 2

## 2016-10-21 MED ORDER — SODIUM CHLORIDE 0.9 % IV BOLUS (SEPSIS)
1000.0000 mL | Freq: Once | INTRAVENOUS | Status: AC
  Administered 2016-10-21: 1000 mL via INTRAVENOUS

## 2016-10-21 MED ORDER — POTASSIUM CHLORIDE CRYS ER 20 MEQ PO TBCR
20.0000 meq | EXTENDED_RELEASE_TABLET | Freq: Every day | ORAL | Status: DC
  Filled 2016-10-21: qty 2
  Filled 2016-10-21: qty 1

## 2016-10-21 MED ORDER — VANCOMYCIN HCL IN DEXTROSE 1-5 GM/200ML-% IV SOLN
1000.0000 mg | Freq: Once | INTRAVENOUS | Status: AC
  Administered 2016-10-21: 1000 mg via INTRAVENOUS
  Filled 2016-10-21: qty 200

## 2016-10-21 MED ORDER — ALBUTEROL SULFATE (2.5 MG/3ML) 0.083% IN NEBU: 2.5000 mg | INHALATION_SOLUTION | Freq: Four times a day (QID) | RESPIRATORY_TRACT | Status: DC | PRN

## 2016-10-21 MED ORDER — PREDNISONE 5 MG PO TABS
7.5000 mg | ORAL_TABLET | Freq: Every day | ORAL | Status: DC
  Administered 2016-10-22 – 2016-10-28 (×7): 7.5 mg via ORAL
  Filled 2016-10-21 (×7): qty 2

## 2016-10-21 MED ORDER — MYCOPHENOLATE MOFETIL 250 MG PO CAPS
500.0000 mg | ORAL_CAPSULE | Freq: Two times a day (BID) | ORAL | Status: DC
Start: 2016-10-22 — End: 2016-10-28
  Administered 2016-10-22 – 2016-10-28 (×14): 500 mg via ORAL
  Filled 2016-10-21 (×14): qty 2

## 2016-10-21 NOTE — ED Notes (Signed)
Pt taken to CT.

## 2016-10-21 NOTE — ED Notes (Signed)
ED Provider at bedside. 

## 2016-10-21 NOTE — Progress Notes (Signed)
Pharmacy Antibiotic Note  Teresa Lee is a 56 y.o. female admitted on 10/21/2016 with cellulitis.  Pharmacy has been consulted for vancomycin dosing. Pt is afebrile and WBC is elevated at 24.6. Scr is WNL and lactic acid is <2.   Plan: Vancomycin 1gm IV x 1 then 750mg  IV Q12H F/u renal fxn, C&S, clinical status trough at SS  Height: 5\' 1"  (154.9 cm) Weight: 143 lb 11.8 oz (65.2 kg) IBW/kg (Calculated) : 47.8  Temp (24hrs), Avg:98.1 F (36.7 C), Min:98.1 F (36.7 C), Max:98.1 F (36.7 C)   Recent Labs Lab 10/21/16 1631 10/21/16 1637  WBC 24.6*  --   CREATININE 0.79  --   LATICACIDVEN  --  1.65    Estimated Creatinine Clearance: 69.5 mL/min (by C-G formula based on SCr of 0.79 mg/dL).    Allergies  Allergen Reactions  . Percocet [Oxycodone-Acetaminophen] Hives  . Sulfamethoxazole-Trimethoprim Hives  . Orange Concentrate [Flavoring Agent]     Acid reflux  . Other     Some nuts cause a rash  . Tomato     Burns skin  . Cefixime     Due to myasthenia gravis    Antimicrobials this admission: Vanc 8/4>>  Dose adjustments this admission: N/A  Microbiology results: Pending  Thank you for allowing pharmacy to be a part of this patient's care.  Yao [place], Rande Lawman 10/21/2016 5:37 PM

## 2016-10-21 NOTE — H&P (Addendum)
Teresa Lee ASN:[id number] DOB: 01/20/1961 DOA: 10/21/2016     PCP: System, Pcp Not In   Outpatient Specialists: [facility], [facility] Patient coming from:  home Lives   With family    Chief Complaint: swelling and redness of upper back  HPI: Teresa Lee is a 56 y.o. female with medical history significant of leukocytosis, myasthenia gravis, hyperlipidemia, hypertension, asthma , fibromyalgia, melanoma    Presented with inflammation of her upper back where she have had melanoma removed last week. She states stiches were place and is first was healing well but then she developed some redness and swelling around the site. Initially she required initial incision 2 weeks ago but then had to go back and clear the borders. She patient had bandage over the area when a family member removed today there was a lot of swelling and redness around the site. She's been feeling lightheaded and unwell but no fevers or chills no nausea vomiting  She's been taking her pain medications and higher amounts than usual to control her discomfort and ran out. Patient has chronic leukocytosis but today her white blood cell count is up from baseline up to 24.  Regarding pertinent Chronic problems: History is inflammatory bowel disease on mesalamine followed by [facility] Also history of myasthenia gravis on CellCept and prednisone status post thymectomy chronically followed by [facility]   IN ER:  Temp (24hrs), Avg:98.1 F (36.7 C), Min:98.1 F (36.7 C), Max:98.1 F (36.7 C)      on arrival  ED Triage Vitals  Enc Vitals Group     BP 10/21/16 1523 121/71     Pulse Rate 10/21/16 1523 (!) 110     Resp 10/21/16 1523 18     Temp 10/21/16 1523 98.1 F (36.7 C)     Temp Source 10/21/16 1523 Oral     SpO2 10/21/16 1523 98 %     Weight 10/21/16 1523 143 lb 11.2 oz (65.2 kg)     Height 10/21/16 1730 5' 1"  (1.549 m)     Head Circumference --      Peak Flow --      Pain Score 10/21/16 1526 10    Pain Loc --      Pain Edu? --      Excl. in [place]? --    RR 16 95% HR 103 BP 108/62 Lactic acid 1.65 WBC 24.6 Hg 11.9 PLT 214 Na 136 K 2.8 Cr 0.79  Following Medications were ordered in ER: Medications  vancomycin (VANCOCIN) IVPB 1000 mg/200 mL premix (1,000 mg Intravenous New Bag/Given 10/21/16 1754)  sodium chloride 0.9 % bolus 1,000 mL (0 mLs Intravenous Stopped 10/21/16 1737)  morphine 4 MG/ML injection 6 mg (6 mg Intravenous Given 10/21/16 1626)  ondansetron (ZOFRAN) injection 4 mg (4 mg Intravenous Given 10/21/16 1703)  potassium chloride SA (K-DUR,KLOR-CON) CR tablet 40 mEq (40 mEq Oral Given 10/21/16 1805)  HYDROmorphone (DILAUDID) injection 0.5 mg (0.5 mg Intravenous Given 10/21/16 1820)      Hospitalist was called for admission for Wound infection after melanoma resection  Review of Systems:    Pertinent positives include:  Fatigue, chills loss of appetite, dizziness, palpitations.  Constitutional:  No weight loss, night sweats, Fevers,  weight loss  HEENT:  No headaches, Difficulty swallowing,Tooth/dental problems,Sore throat,  No sneezing, itching, ear ache, nasal congestion, post nasal drip,  Cardio-vascular:  No chest pain, Orthopnea, PND, anasarca, no Bilateral lower extremity swelling  GI:  No heartburn, indigestion, abdominal  pain, nausea, vomiting, diarrhea, change in bowel habits, melena, blood in stool, hematemesis Resp:  no shortness of breath at rest. No dyspnea on exertion, No excess mucus, no productive cough, No non-productive cough, No coughing up of blood.No change in color of mucus.No wheezing. Skin:  no rash or lesions. No jaundice GU:  no dysuria, change in color of urine, no urgency or frequency. No straining to urinate.  No flank pain.  Musculoskeletal:  No joint pain or no joint swelling. No decreased range of motion. No back pain.  Psych:  No change in mood or affect. No depression or anxiety. No memory loss.  Neuro: no localizing neurological  complaints, no tingling, no weakness, no double vision, no gait abnormality, no slurred speech, no confusion  As per HPI otherwise 10 point review of systems negative.   Past Medical History: Past Medical History:  Diagnosis Date  . Arthritis    osteoarthritis  . Asthma   . Blind left eye   . Cancer ([employer])    skin  . Crohn's disease ([place])   . CTS (carpal tunnel syndrome)   . DJD (degenerative joint disease)    Both cervical spine and LS spine  . Fibromyalgia   . GERD (gastroesophageal reflux disease)   . Heart murmur   . Herniated nucleus pulposus   . Hyperlipidemia   . Hypertension   . IBS (irritable bowel syndrome)   . Leukocytosis   . Myasthenia gravis   . Myasthenia gravis [facility])    Past Surgical History:  Procedure Laterality Date  . BREAST REDUCTION SURGERY    . CARPAL TUNNEL RELEASE     bilateral  . cervical disc inj    . CHOLECYSTECTOMY    . ENDOMETRIAL ABLATION W/ NOVASURE    . MELANOMA EXCISION    . NASAL SINUS SURGERY    . THYMECTOMY     due to myastenia gravis  . THYMECTOMY       Social History:  Ambulatory  independently      reports that she has never smoked. She has never used smokeless tobacco. She reports that she drinks alcohol. She reports that she does not use drugs.  Allergies:   Allergies  Allergen Reactions  . Percocet [Oxycodone-Acetaminophen] Hives  . Sulfamethoxazole-Trimethoprim Hives  . Nucynta [Tapentadol]     Headache   . Orange Concentrate [Flavoring Agent]     Acid reflux  . Other     Some nuts cause a rash  . Tomato     Burns skin  . Cefixime     Due to myasthenia gravis       Family History:   Family History  Problem Relation Age of Onset  . Asthma Daughter   . Heart disease Mother   . Cancer Mother     Medications: Prior to Admission medications   Medication Sig Start Date End Date Taking? Authorizing Provider  albuterol (PROVENTIL) (2.5 MG/3ML) 0.083% nebulizer solution Take 2.5 mg by nebulization  every 6 (six) hours as needed for wheezing.   Yes [provider]  Cholecalciferol (VITAMIN D-3) 5000 UNITS TABS Take 1 tablet by mouth daily.   Yes [provider]  citalopram (CELEXA) 40 MG tablet Take 40 mg by mouth daily.   Yes [provider]  DELZICOL 400 MG CPDR DR capsule Take 800 mg by mouth 2 (two) times daily. 08/17/16  Yes [provider]  desmopressin (DDAVP) 0.1 MG tablet Take 0.1 mg by mouth daily.   Yes [provider]  diclofenac sodium (VOLTAREN) 1 % GEL Apply 2 g topically 4 (four) times daily as needed (for back/shoulder pain).   Yes [provider]  estradiol (ESTRACE) 2 MG tablet Take 2 mg by mouth 2 (two) times daily.    Yes [provider]  famotidine (PEPCID) 20 MG tablet Take 20 mg by mouth at bedtime.   Yes [provider]  losartan (COZAAR) 50 MG tablet Take 50 mg by mouth daily.   Yes [provider]  meloxicam (MOBIC) 15 MG tablet Take 15 mg by mouth daily.   Yes [provider]  mycophenolate (CELLCEPT) 500 MG tablet Take 500 mg by mouth 2 (two) times daily.    Yes [provider]  ondansetron (ZOFRAN) 4 MG tablet Take 4 mg by mouth every 8 (eight) hours as needed for nausea or vomiting.   Yes [provider]  pantoprazole (PROTONIX) 40 MG tablet Take 40 mg by mouth daily.   Yes [provider]  potassium chloride (KLOR-CON) 20 MEQ packet Take 10-20 mEq by mouth daily.    Yes [provider]  predniSONE (DELTASONE) 5 MG tablet Take 7.5 mg by mouth daily.    Yes [provider]  progesterone (PROMETRIUM) 100 MG capsule Take 100 mg by mouth at bedtime.   Yes [provider]  rOPINIRole (REQUIP) 1 MG tablet Take 1-2 mg by mouth at bedtime.   Yes [provider]  triamterene-hydrochlorothiazide (DYAZIDE) 37.5-25 MG per capsule Take 1 capsule by mouth every morning.   Yes [provider]  FLUoxetine (PROZAC) 40 MG  capsule Take 40 mg by mouth daily.    [provider]  gabapentin (NEURONTIN) 800 MG tablet Take 800 mg by mouth 2 (two) times daily.    [provider]  HYDROcodone-acetaminophen (NORCO) 10-325 MG per tablet Take 1 tablet by mouth every 4 (four) hours as needed.     [provider]  Omega-3 Fatty Acids (FISH OIL) 1000 MG CAPS Take 2 capsules (2,000 mg total) by mouth 2 (two) times daily. Patient not taking: Reported on 10/21/2016 08/25/13   Thompson Grayer, MD    Physical Exam: Patient Vitals for the past 24 hrs:  BP Temp Temp src Pulse Resp SpO2 Height Weight  10/21/16 1802 108/62 - - (!) 103 16 95 % - -  10/21/16 1730 (!) 102/52 - - 100 - 93 % 5' 1"  (1.549 m) 65.2 kg (143 lb 11.8 oz)  10/21/16 1700 (!) 115/92 - - (!) 104 - 96 % - -  10/21/16 1645 97/73 - - (!) 102 - 97 % - -  10/21/16 1630 118/62 - - (!) 107 - 92 % - -  10/21/16 1615 115/65 - - (!) 101 - 96 % - -  10/21/16 1600 122/65 - - (!) 105 - 95 % - -  10/21/16 1523 121/71 98.1 F (36.7 C) Oral (!) 110 18 98 % - 65.2 kg (143 lb 11.2 oz)    1. General:  in No Acute distress 2. Psychological: Alert and  Oriented 3. Head/ENT:    Dry Mucous Membranes                          Head Non traumatic, neck supple                           Poor Dentition 4. SKIN: normal  decreased Skin turgor,  Skin clean Dry  Upper back incision site with induration and redness and warmth around it    5. Heart: Regular rate and rhythm no  Murmur, Rub or gallop 6. Lungs:  Clear to auscultation bilaterally, no wheezes or crackles   7. Abdomen: Soft,  non-tender, Non distended 8. Lower extremities: no clubbing, cyanosis, or edema 9. Neurologically Grossly intact, moving all 4 extremities equally    10. MSK: Normal range of motion   body mass index is 27.16 kg/m.  Labs on Admission:   Labs on Admission: I have personally reviewed following labs and imaging studies  CBC:  Recent Labs Lab 10/21/16 1631  WBC 24.6*    NEUTROABS 20.8*  HGB 11.9*  HCT 34.7*  MCV 94.3  PLT 623   Basic Metabolic Panel:  Recent Labs Lab 10/21/16 1631  NA 136  K 2.8*  CL 99*  CO2 24  GLUCOSE 128*  BUN 14  CREATININE 0.79  CALCIUM 8.7*   GFR: Estimated Creatinine Clearance: 69.5 mL/min (by C-G formula based on SCr of 0.79 mg/dL). Liver Function Tests: No results for input(s): AST, ALT, ALKPHOS, BILITOT, PROT, ALBUMIN in the last 168 hours. No results for input(s): LIPASE, AMYLASE in the last 168 hours. No results for input(s): AMMONIA in the last 168 hours. Coagulation Profile: No results for input(s): INR, PROTIME in the last 168 hours. Cardiac Enzymes: No results for input(s): CKTOTAL, CKMB, CKMBINDEX, TROPONINI in the last 168 hours. BNP (last 3 results) No results for input(s): PROBNP in the last 8760 hours. HbA1C: No results for input(s): HGBA1C in the last 72 hours. CBG: No results for input(s): GLUCAP in the last 168 hours. Lipid Profile: No results for input(s): CHOL, HDL, LDLCALC, TRIG, CHOLHDL, LDLDIRECT in the last 72 hours. Thyroid Function Tests: No results for input(s): TSH, T4TOTAL, FREET4, T3FREE, THYROIDAB in the last 72 hours. Anemia Panel: No results for input(s): VITAMINB12, FOLATE, FERRITIN, TIBC, IRON, RETICCTPCT in the last 72 hours. Urine analysis: No results found for: COLORURINE, APPEARANCEUR, LABSPEC, PHURINE, GLUCOSEU, HGBUR, BILIRUBINUR, KETONESUR, PROTEINUR, UROBILINOGEN, NITRITE, LEUKOCYTESUR Sepsis Labs: @LABRCNTIP (procalcitonin:4,lacticidven:4) )No results found for this or any previous visit (from the past 240 hour(s)).    UA  not ordered  No results found for: HGBA1C  Estimated Creatinine Clearance: 69.5 mL/min (by C-G formula based on SCr of 0.79 mg/dL).  BNP (last 3 results) No results for input(s): PROBNP in the last 8760 hours.   ECG REPORT  Independently reviewed Rate: 101  Rhythm: Sinus Tachycardiology ST&T Change: No acute ischemic changes   QTC  442  Filed Weights   10/21/16 1523 10/21/16 1730  Weight: 65.2 kg (143 lb 11.2 oz) 65.2 kg (143 lb 11.8 oz)     Cultures: No results found for: SDES, SPECREQUEST, CULT, REPTSTATUS   Radiological Exams on Admission: No results found.  Chart has been reviewed    Assessment/Plan  56 y.o. female with medical history significant of leukocytosis, myasthenia gravis, hyperlipidemia, hypertension, asthma , fibromyalgia, melanoma  Admitted for Wound infection after melanoma resection  Present on Admission: . Cellulitis - -admit per cellulitis protocol will        vancomycin given purulent discharge,          imaging showed:   no evidence of air no evidence of osteomyelitis  no    foreign   objects             Will obtain MRSA screening,       obtain blood cultures  further antibiotic adjustment pending above results  . Leukocytosis has some chronic microcytosis but currently increased from baseline to secondary to cellulitis . Essential hypertension stable continue home medications . Hypokalemia place and check magnesium level . Wound infection after surgery cover with IV vancomycin await results of blood cultures . Sepsis ([place]) - continue IV antibiotics rehydrate and follow  . Myasthenia gravis ([place]) - spoke to neurology continue CellCept and prednisone currently appears to be stable if decompensates call neurology for consult  . Melanoma ([facility])Who continue to follow-up as an outpatient with dermatology  . Crohn disease ([place]) Stable continue home medications  . Asthma -  stable continue albuterol as needed   Other plan as per orders.  DVT prophylaxis:  SCD    Code Status:  FULL CODE  as per patient    Family Communication:   Family at  Bedside  plan of care was discussed with  mother  Disposition Plan:    To home once workup is complete and patient is stable                          Consults called: Discussed with Neurology call back if worsening myasthenia gravis  symptoms  Admission status:   inpatient      Level of care  tele      I have spent a total of 66 min on this admission   extra time was spent to discuss case with consultants  [place] 10/21/2016, 8:52 PM    [facility]  Pager [phone]   after 2 AM please page floor coverage PA If 7AM-7PM, please contact the day team taking care of the patient  [email or web address]  Password [email or web address]

## 2016-10-21 NOTE — ED Provider Notes (Signed)
Medical screening examination/treatment/procedure(s) were conducted as a shared visit with non-physician practitioner(s) and myself.  I personally evaluated the patient during the encounter.   EKG Interpretation None      56 year old female who presents with postop infection. Patient reports melanoma excision at [facility] dermatology last Tuesday. It was part of a two-part procedure that started 2 weeks ago on Thursday. Over the past 1-2 days she has had increasing pain over her excision. Her husband had noticed increased redness and swelling around the excision on her upper back. She has not had fever but having fatigues and body aches. She has a history of myasthenia gravis Crohn's disease, is chronically immunosuppressed. No fevers or chills.  The patient is afebrile, but tachycardic in the ED. Is normotensive. Has chronically elevated white count, but more significantly elevated today to 24. Normal lactic acid. Her excision in the left upper back is well approximated by sutures but there is significant erythema, warmth, induration, and tenderness to palpation. No appreciable area of abscess and no active drainage. Given her chronic immune suppression, leukocytosis and tachycardia will likely require IV antibiotics and admission.   Forde Dandy, MD 10/21/16 [phone]

## 2016-10-21 NOTE — ED Notes (Signed)
Dr Roel Cluck in room.

## 2016-10-21 NOTE — ED Provider Notes (Signed)
[facility] DEPT Provider Note   CSN: [id number] Arrival date & time: 10/21/16  1517     History   Chief Complaint Chief Complaint  Patient presents with  . Post-op Problem    HPI Teresa Lee is a 56 y.o. female.  HPI Teresa Lee is a 55 y.o. female with history of arthritis, fibromyalgia, Crohn's disease, chronic pain, myasthenia gravis, hypertension, presents to emergency department complaining of redness, pain, swelling around an incision on her back. Patient had melanoma removed 2 days ago. She states the initial incision was made 2 weeks ago, however they had to go back in and remove extra tissue to get clear borders. She states that this was done under local anesthesia and stitches were placed. Patient had a bandage over the area, which was removed by family member today. Patient reports increased redness, swelling, pain around the incision onset yesterday. She states she feels weak, dizzy, and having chills and sweats. She is on chronic pain medications and has run out a few days ago due to taking higher than normal dose.  Past Medical History:  Diagnosis Date  . Arthritis    osteoarthritis  . Asthma   . Blind left eye   . Cancer ([place])    skin  . Crohn's disease ([place])   . CTS (carpal tunnel syndrome)   . DJD (degenerative joint disease)    Both cervical spine and LS spine  . Fibromyalgia   . GERD (gastroesophageal reflux disease)   . Heart murmur   . Herniated nucleus pulposus   . Hyperlipidemia   . Hypertension   . IBS (irritable bowel syndrome)   . Leukocytosis   . Myasthenia gravis   . Myasthenia gravis [facility])     Patient Active Problem List   Diagnosis Date Noted  . Palpitations 08/25/2013  . Leukocytosis 05/25/2011  . HYPERLIPIDEMIA 04/20/2007  . HYPERTENSION 04/20/2007  . ASTHMA 04/20/2007  . CARPAL TUNNEL SYNDROME, HX OF 04/20/2007    Past Surgical History:  Procedure Laterality Date  . BREAST REDUCTION SURGERY    . CARPAL TUNNEL RELEASE      bilateral  . cervical disc inj    . CHOLECYSTECTOMY    . ENDOMETRIAL ABLATION W/ NOVASURE    . MELANOMA EXCISION    . NASAL SINUS SURGERY    . THYMECTOMY     due to myastenia gravis  . THYMECTOMY      OB History    No data available       Home Medications    Prior to Admission medications   Medication Sig Start Date End Date Taking? Authorizing Provider  albuterol (PROVENTIL) (2.5 MG/3ML) 0.083% nebulizer solution Take 2.5 mg by nebulization every 6 (six) hours as needed for wheezing.    [provider]  Cholecalciferol (VITAMIN D-3) 5000 UNITS TABS Take 1 tablet by mouth daily.    [provider]  desmopressin (DDAVP) 0.1 MG tablet Take 0.1 mg by mouth daily.    [provider]  diclofenac sodium (VOLTAREN) 1 % GEL Apply 2 g topically 4 (four) times daily as needed (for back/shoulder pain).    [provider]  estradiol (ESTRACE) 2 MG tablet Take 2 mg by mouth daily.    [provider]  famotidine (PEPCID) 20 MG tablet Take 20 mg by mouth at bedtime.    [provider]  FLUoxetine (PROZAC) 40 MG capsule Take 40 mg by mouth daily.    [provider]  gabapentin (NEURONTIN) 800  MG tablet Take 800 mg by mouth 2 (two) times daily.    [provider]  HYDROcodone-acetaminophen (NORCO) 10-325 MG per tablet Take 1 tablet by mouth every 4 (four) hours as needed.     [provider]  losartan (COZAAR) 50 MG tablet Take 50 mg by mouth daily.    [provider]  meloxicam (MOBIC) 15 MG tablet Take 15 mg by mouth daily.    [provider]  mesalamine (PENTASA) 500 MG CR capsule Take 1,000 mg by mouth 2 (two) times daily.     [provider]  mycophenolate (CELLCEPT) 500 MG tablet Take 500 mg by mouth 2 (two) times daily.     [provider]  Omega-3 Fatty Acids (FISH OIL) 1000 MG CAPS Take 2 capsules (2,000 mg total) by mouth 2 (two) times daily. 08/25/13   Allred, Jeneen Rinks, MD    ondansetron (ZOFRAN-ODT) 4 MG disintegrating tablet Take 4 mg by mouth every 8 (eight) hours as needed for nausea.     [provider]  pantoprazole (PROTONIX) 40 MG tablet Take 40 mg by mouth daily.    [provider]  potassium chloride (KLOR-CON) 20 MEQ packet Take 10-20 mEq by mouth daily.     [provider]  predniSONE (DELTASONE) 5 MG tablet Take 5 mg by mouth daily.    [provider]  QUEtiapine (SEROQUEL) 25 MG tablet Take 25 mg by mouth at bedtime. May take 2 tablets at night    [provider]  triamterene-hydrochlorothiazide (DYAZIDE) 37.5-25 MG per capsule Take 1 capsule by mouth every morning.    [provider]    Family History Family History  Problem Relation Age of Onset  . Asthma Daughter   . Heart disease Mother   . Cancer Mother     Social History Social History  Substance Use Topics  . Smoking status: Never Smoker  . Smokeless tobacco: Never Used  . Alcohol use Yes     Comment: occassional wine     Allergies   Percocet [oxycodone-acetaminophen]; Sulfamethoxazole-trimethoprim; Orange concentrate [flavoring agent]; Other; Tomato; and Cefixime   Review of Systems Review of Systems  Constitutional: Positive for chills and fatigue. Negative for fever.  Respiratory: Negative for cough, chest tightness and shortness of breath.   Cardiovascular: Negative for chest pain, palpitations and leg swelling.  Gastrointestinal: Negative for abdominal pain, diarrhea, nausea and vomiting.  Genitourinary: Negative for dysuria.  Musculoskeletal: Positive for arthralgias. Negative for myalgias, neck pain and neck stiffness.  Skin: Positive for color change and wound. Negative for rash.  Neurological: Positive for weakness. Negative for dizziness and headaches.  All other systems reviewed and are negative.    Physical Exam Updated Vital Signs BP 121/71 (BP Location: Right Arm)   Pulse (!) 110   Temp 98.1 F (36.7  C) (Oral)   Resp 18   Wt 65.2 kg (143 lb 11.2 oz)   SpO2 98%   BMI 27.60 kg/m   Physical Exam  Constitutional: She appears well-developed and well-nourished. No distress.  HENT:  Head: Normocephalic.  Eyes: Conjunctivae are normal.  Neck: Neck supple.  Cardiovascular: Normal rate, regular rhythm and normal heart sounds.   Pulmonary/Chest: Effort normal and breath sounds normal. No respiratory distress. She has no wheezes. She has no rales.  Musculoskeletal: She exhibits no edema.  Neurological: She is alert.  Skin: Skin is warm and dry.  Incision to the left mid back, with induration over the incision and surrounding area. Erythema  present. Severe tenderness to the touch over the area  Psychiatric: She has a normal mood and affect. Her behavior is normal.  Nursing note and vitals reviewed.    ED Treatments / Results  Labs (all labs ordered are listed, but only abnormal results are displayed) Labs Reviewed - No data to display  EKG  EKG Interpretation  Date/Time:  Saturday October 21 2016 19:58:06 EDT Ventricular Rate:  101 PR Interval:    QRS Duration: 73 QT Interval:  341 QTC Calculation: 442 R Axis:   110 Text Interpretation:  Right and left arm electrode reversal, interpretation assumes no reversal Sinus tachycardia Probable lateral infarct, age indeterminate Baseline wander in lead(s) I no acute changes  Confirmed by Brantley Stage [phone]) on 10/21/2016 8:44:46 PM       Radiology Ct Chest W Contrast  Result Date: 10/21/2016 CLINICAL DATA:  Postoperative infection, acute onset, status post resection of melanoma at the upper back. Erythema and swelling about the surgical site. Fatigue and body aches. Leukocytosis. Initial encounter. EXAM: CT CHEST WITH CONTRAST TECHNIQUE: Multidetector CT imaging of the chest was performed during intravenous contrast administration. CONTRAST:  40mL ISOVUE-300 IOPAMIDOL (ISOVUE-300) INJECTION 61% COMPARISON:  Chest radiograph performed  10/01/2009 FINDINGS: Cardiovascular: The heart is normal in size. The thoracic aorta is unremarkable in appearance. The great vessels are within normal limits. No calcific atherosclerotic disease is seen. Mediastinum/Nodes: No mediastinal lymphadenopathy is appreciated. No pericardial effusion is identified. The thyroid gland is unremarkable. No axillary lymphadenopathy is seen. The patient is status post median sternotomy. Lungs/Pleura: Minimal scarring is noted at the lung bases. No pleural effusion or pneumothorax is seen. No masses are identified. Upper Abdomen: The visualized portions of the liver and spleen are unremarkable. The patient is status post cholecystectomy, with clips noted at the gallbladder fossa. The visualized portions of the pancreas, adrenal glands and kidneys are within normal limits. Musculoskeletal: Soft tissue edema is seen tracking along the mid and left upper back, with mild associated skin thickening. This may reflect cellulitis, given the patient's symptoms. No abscess is seen at this time. No postoperative seroma is identified. No acute osseous abnormalities are identified. The visualized musculature is unremarkable in appearance. IMPRESSION: 1. Soft tissue edema along the mid and left upper back, with mild associated skin thickening. This may reflect cellulitis, given the patient's symptoms. No evidence of abscess or postoperative seroma at this time. 2. Minimal scarring at the lung bases.  Lungs otherwise clear. Electronically Signed   By: Garald Balding M.D.   On: 10/21/2016 23:08    Procedures Procedures (including critical care time)  Medications Ordered in ED Medications - No data to display   Initial Impression / Assessment and Plan / ED Course  I have reviewed the triage vital signs and the nursing notes.  Pertinent labs & imaging results that were available during my care of the patient were reviewed by me and considered in my medical decision making (see chart  for details).     Patient with what appears to be possibly infected incision with surrounding cellulitis to the left middle back. She is afebrile, she is tachycardic. Will check labs, will give IV fluids, pain medication, will monitor.   6:49 PM White blood cell count is 24.6. This is significantly higher than her baseline. Patient is immunosuppressed with chronically elevated white blood cell count. She is Mollie tachycardic with heart rate around 100-105, blood pressure remaining stable. Lactic acid is normal. I do not think she is septic, however  IV fluids started, antibiotics ordered, and blood culture sent. I discussed patient with hospitalist, they will admit for IV antibiotics. Potassium 2.8, revealed replaced with 40 mEq PO  Vitals:   10/21/16 1645 10/21/16 1700 10/21/16 1730 10/21/16 1802  BP: 97/73 (!) 115/92 (!) 102/52 108/62  Pulse: (!) 102 (!) 104 100 (!) 103  Resp:    16  Temp:      TempSrc:      SpO2: 97% 96% 93% 95%  Weight:   65.2 kg (143 lb 11.8 oz)   Height:   5\' 1"  (1.549 m)      Final Clinical Impressions(s) / ED Diagnoses   Final diagnoses:  Postoperative wound infection, initial encounter    New Prescriptions Current Discharge Medication List       Jeannett Senior, PA-C 10/22/16 0015    Forde Dandy, MD 10/22/16 [phone]

## 2016-10-21 NOTE — ED Triage Notes (Addendum)
Sent to ED from [facility] for further eval of skin infection to upper back where pt had melanoma removed last Thursday. Drsg intact but skin red and hot around drsg. Pt complains of weakness over the last few days

## 2016-10-22 ENCOUNTER — Inpatient Hospital Stay (HOSPITAL_COMMUNITY): Payer: Medicare HMO

## 2016-10-22 LAB — CBC
HEMATOCRIT: 30.9 % — AB (ref 36.0–46.0)
Hemoglobin: 10.5 g/dL — ABNORMAL LOW (ref 12.0–15.0)
MCH: 32.2 pg (ref 26.0–34.0)
MCHC: 34 g/dL (ref 30.0–36.0)
MCV: 94.8 fL (ref 78.0–100.0)
PLATELETS: 204 10*3/uL (ref 150–400)
RBC: 3.26 MIL/uL — ABNORMAL LOW (ref 3.87–5.11)
RDW: 14.1 % (ref 11.5–15.5)
WBC: 22.8 10*3/uL — ABNORMAL HIGH (ref 4.0–10.5)

## 2016-10-22 LAB — PROCALCITONIN: Procalcitonin: 0.21 ng/mL

## 2016-10-22 LAB — COMPREHENSIVE METABOLIC PANEL
ALBUMIN: 2.8 g/dL — AB (ref 3.5–5.0)
ALT: 9 U/L — AB (ref 14–54)
AST: 12 U/L — AB (ref 15–41)
Alkaline Phosphatase: 49 U/L (ref 38–126)
Anion gap: 6 (ref 5–15)
BUN: 6 mg/dL (ref 6–20)
CHLORIDE: 101 mmol/L (ref 101–111)
CO2: 26 mmol/L (ref 22–32)
CREATININE: 0.76 mg/dL (ref 0.44–1.00)
Calcium: 7.8 mg/dL — ABNORMAL LOW (ref 8.9–10.3)
GFR calc Af Amer: >60 mL/min (ref >60)
GFR calc non Af Amer: >60 mL/min (ref >60)
Glucose, Bld: 107 mg/dL — ABNORMAL HIGH (ref 65–99)
POTASSIUM: 3.2 mmol/L — AB (ref 3.5–5.1)
SODIUM: 133 mmol/L — AB (ref 135–145)
Total Bilirubin: 1 mg/dL (ref 0.3–1.2)
Total Protein: 5.8 g/dL — ABNORMAL LOW (ref 6.5–8.1)

## 2016-10-22 LAB — HIV ANTIBODY (ROUTINE TESTING W REFLEX): HIV Screen 4th Generation wRfx: NONREACTIVE

## 2016-10-22 LAB — PROTIME-INR
INR: 1.07
Prothrombin Time: 13.9 seconds (ref 11.4–15.2)

## 2016-10-22 LAB — TSH: TSH: 1.654 u[IU]/mL (ref 0.350–4.500)

## 2016-10-22 LAB — MRSA PCR SCREENING: MRSA BY PCR: NEGATIVE

## 2016-10-22 LAB — PHOSPHORUS: Phosphorus: 2.1 mg/dL — ABNORMAL LOW (ref 2.5–4.6)

## 2016-10-22 LAB — MAGNESIUM: Magnesium: 1 mg/dL — ABNORMAL LOW (ref 1.7–2.4)

## 2016-10-22 LAB — LACTIC ACID, PLASMA
LACTIC ACID, VENOUS: 0.9 mmol/L (ref 0.5–1.9)
LACTIC ACID, VENOUS: 2.3 mmol/L — AB (ref 0.5–1.9)

## 2016-10-22 LAB — APTT: aPTT: 29 seconds (ref 24–36)

## 2016-10-22 MED ORDER — SODIUM CHLORIDE 0.9 % IV SOLN
INTRAVENOUS | Status: DC
  Administered 2016-10-22 – 2016-10-23 (×2): via INTRAVENOUS
  Administered 2016-10-23: 1000 mL via INTRAVENOUS
  Administered 2016-10-25 – 2016-10-26 (×2): via INTRAVENOUS

## 2016-10-22 MED ORDER — POTASSIUM CHLORIDE CRYS ER 20 MEQ PO TBCR
40.0000 meq | EXTENDED_RELEASE_TABLET | Freq: Every day | ORAL | Status: DC
  Administered 2016-10-22: 40 meq via ORAL

## 2016-10-22 MED ORDER — DEXTROSE 5 % IV SOLN
20.0000 mmol | Freq: Once | INTRAVENOUS | Status: AC
  Administered 2016-10-22: 20 mmol via INTRAVENOUS
  Filled 2016-10-22: qty 6.67

## 2016-10-22 MED ORDER — HYDROCODONE-ACETAMINOPHEN 10-325 MG PO TABS
1.0000 | ORAL_TABLET | Freq: Four times a day (QID) | ORAL | Status: DC | PRN
  Administered 2016-10-22 – 2016-10-28 (×13): 1 via ORAL
  Filled 2016-10-22 (×14): qty 1

## 2016-10-22 MED ORDER — KETOROLAC TROMETHAMINE 30 MG/ML IJ SOLN
30.0000 mg | Freq: Once | INTRAMUSCULAR | Status: AC
  Administered 2016-10-22: 30 mg via INTRAVENOUS
  Filled 2016-10-22: qty 1

## 2016-10-22 MED ORDER — VANCOMYCIN HCL IN DEXTROSE 750-5 MG/150ML-% IV SOLN
750.0000 mg | Freq: Two times a day (BID) | INTRAVENOUS | Status: DC
  Administered 2016-10-22 – 2016-10-25 (×7): 750 mg via INTRAVENOUS
  Filled 2016-10-22 (×9): qty 150

## 2016-10-22 MED ORDER — HYDROMORPHONE HCL 1 MG/ML IJ SOLN
1.0000 mg | INTRAMUSCULAR | Status: DC | PRN
  Administered 2016-10-22 – 2016-10-23 (×5): 1 mg via INTRAVENOUS
  Filled 2016-10-22 (×5): qty 1

## 2016-10-22 MED ORDER — PROMETHAZINE HCL 25 MG/ML IJ SOLN
12.5000 mg | Freq: Three times a day (TID) | INTRAMUSCULAR | Status: DC | PRN
  Administered 2016-10-22 – 2016-10-23 (×3): 12.5 mg via INTRAVENOUS
  Filled 2016-10-22 (×3): qty 1

## 2016-10-22 MED ORDER — PIPERACILLIN-TAZOBACTAM 3.375 G IVPB
3.3750 g | Freq: Three times a day (TID) | INTRAVENOUS | Status: DC
  Administered 2016-10-22 – 2016-10-23 (×2): 3.375 g via INTRAVENOUS
  Filled 2016-10-22 (×3): qty 50

## 2016-10-22 MED ORDER — DIPHENHYDRAMINE HCL 25 MG PO CAPS
50.0000 mg | ORAL_CAPSULE | ORAL | Status: DC | PRN
  Administered 2016-10-22 – 2016-10-28 (×5): 50 mg via ORAL
  Filled 2016-10-22 (×5): qty 2

## 2016-10-22 MED ORDER — HYDROMORPHONE HCL 1 MG/ML IJ SOLN
0.5000 mg | Freq: Once | INTRAMUSCULAR | Status: AC
  Administered 2016-10-22: 0.5 mg via INTRAVENOUS

## 2016-10-22 MED ORDER — MAGNESIUM SULFATE 4 GM/100ML IV SOLN
4.0000 g | Freq: Once | INTRAVENOUS | Status: AC
  Administered 2016-10-22: 4 g via INTRAVENOUS
  Filled 2016-10-22: qty 100

## 2016-10-22 MED ORDER — POTASSIUM CHLORIDE CRYS ER 20 MEQ PO TBCR: 40.0000 meq | EXTENDED_RELEASE_TABLET | Freq: Two times a day (BID) | ORAL | Status: DC

## 2016-10-22 NOTE — Progress Notes (Signed)
Pharmacy Antibiotic Note  Teresa Lee is a 56 y.o. female admitted on 10/21/2016 with cellulitis.  Pharmacy has been consulted for vancomycin dosing. Noted purulent drainage from wound today and MD has requested pharmacy add Zosyn for gram negative coverage.  Pt is afebrile and WBC is elevated at 22.8 (slightly improved). Scr is WNL and lactic acid is <2.   Plan: Continue Vancomycin 750mg  IV Q12H Add Zosyn 3.375 gm IV q8h (4 hour infusion). F/u renal fxn, C&S, clinical status trough at SS  Height: 5\' 1"  (154.9 cm) Weight: 143 lb 11.8 oz (65.2 kg) IBW/kg (Calculated) : 47.8  Temp (24hrs), Avg:99.4 F (37.4 C), Min:98.1 F (36.7 C), Max:100.2 F (37.9 C)   Recent Labs Lab 10/21/16 1631 10/21/16 1637 10/21/16 2348 10/22/16 0239  WBC 24.6*  --   --  22.8*  CREATININE 0.79  --   --  0.76  LATICACIDVEN  --  1.65 2.3* 0.9    Estimated Creatinine Clearance: 69.5 mL/min (by C-G formula based on SCr of 0.76 mg/dL).    Allergies  Allergen Reactions  . Percocet [Oxycodone-Acetaminophen] Hives  . Sulfamethoxazole-Trimethoprim Hives  . Nucynta [Tapentadol]     Headache   . Orange Concentrate [Flavoring Agent]     Acid reflux  . Other     Some nuts cause a rash  . Tomato     Burns skin  . Cefixime     Due to myasthenia gravis    Antimicrobials this admission: Vanc 8/4>> Zosyn 8/5 >>  Dose adjustments this admission: N/A  Microbiology results: 8/4 MRSA PCR negative 8/4 blood x 2 >> ngtd  Thank you for allowing pharmacy to be a part of this patient's care.  [employer], Pharm.D., BCPS Clinical Pharmacist Pager: [phone] Clinical phone for 10/22/2016 from 8:30-4:00 is [phone]. After 4pm, please call Main Rx [phone]) for assistance. 10/22/2016 2:50 PM

## 2016-10-22 NOTE — Progress Notes (Signed)
PROGRESS NOTE    Teresa Lee  PHX:[id number] DOB: 1961-01-16 DOA: 10/21/2016 PCP: System, Pcp Not In   Brief Narrative:  The patient is a 56 y.o. female with medical history significant of leukocytosis, myasthenia gravis, hyperlipidemia, hypertension, asthma , fibromyalgia, melanoma and other comorbids who presented to the ED with inflammation of her upper back where she have had melanoma removed last week. She states stiches were place and is first was healing well but then she developed some redness and swelling around the site. Initially she required initial incision 2 weeks ago but then had to go back and clear the borders. She patient had bandage over the area when a family member removed today there was a lot of swelling and redness around the site. She's been feeling lightheaded and unwell but no fevers or chills no nausea vomiting. She's been taking her pain medications and higher amounts than usual to control her discomfort and ran out. Patient has chronic leukocytosis but today her white blood cell count is up from baseline up to 24. Admitted for Sepsis 2/2 to Acute Cellulitis and was placed on IV Abx for purulent cellulitis. Patient's redness improved per family but continues to be in tremendous pain.   Assessment & Plan:   Active Problems:   Essential hypertension   Asthma   Leukocytosis   Hypokalemia   Wound infection after surgery   Sepsis ([place])   Myasthenia gravis ([place])   Melanoma ([place])   Crohn disease ([place])   Cellulitis   Hypomagnesemia   Hypophosphatemia  Sepsis 2/2 Acute Cellulitis of the Back -Was tachycardic, had Elevated WBC at 24.6, and had Purulent skin infection -Recently had Melanoma Excised on Thursday -Developed Symptoms and Intractable Pain -Sepsis Physiology improving.  -C/w IV Vancomycin given purulent discharge; Will add IV Zosyn with Pharmacy to Dose -CT Chest with Contrast showed Soft tissue edema along the mid and left upper back, with mild  associated skin thickening. This may reflect cellulitis, given the patient's symptoms. No evidence of abscess or postoperative seroma at this time. -CXR showed Cardiomediastinal silhouette is normal for this low inspiratory portable examination with crowded vascular markings. Bibasilar strandy densities without pleural effusion or focal consolidation. Mild bronchitic changes. Status post median sternotomy for CABG with multiple fractured wires. Soft tissue planes and included osseous structures are nonsuspicious. -Lactic Acid went from 2.3 -> 0.9 -WBC went from 24.6 -> 22.8 -MRSA Screen Negative -Blood Cx x2 showed NGTD <24 Hours -Will Add IV Zosyn with Pharmacy to Dose  Intractable Pain in a Patient with Chronic Pain/Fibromyalgia -C/w Acetaminophen 650 mg po q6hprn -Added back Hydrocodone po 10-325 q6hprn -Increased Hydromorphone to 1 mg q4hprn for Severe Pain; Add Benadryl as patient had some itching  -Gave one does of IV Ketorolac 30 mg   Leukocytosis has some Chronic Microcytosis  -Baseline WBC is around 16 per patient -Currently increased from baseline to secondary to cellulitis -C/w Abx as above -Repeat CBC in AM  Essential Hypertension -BP ranging on the lower side -Not on any current medications   Myasthenia gravis ([place])  -Admitting Physician Spoke to Neurology  -Continue CellCept 500 mg po BID and Prednisone 7.5 mg po Daily -Currently appears to be stable if decompensates call Neurology for consult  -Follow up with [facility] as an outpateint   Melanoma -Continue to follow-up as an outpatient with Dermatology   Crohn disease /IBS -Stable  -Continue home medications with Mesalamine 800 mg po BID -C/w Bowel Regiment with Polyethylene Glycol 17 g  po Daily prn and Senna 8.6 mg po BID Daily  Asthma -Stable -Continue Albuterol Nebs 2.5 mg q6hprn for Wheezing/SOB  GERD -C/w Pantoprazole 40 mg po Daily and Famotidine 20 mg po qHS  Fibromyalgia/Arthritis/RLS -Pain  Control as Above -C/w Citalopram 40 mg po Daily -C/w Ropinirole 1-2 mg po Daily qHS  Hypokalemia -Patient's K+ was 3.2 this AM -Replete with IV 20 mmol of KPhos and 40 mEQ of po KCL  -Continue to Monitor and Replete as Necessary -Repeat CMP in AM  Hypomagnesemia -Patient's Mag  was 1.0 this AM -Replete with IV Mag Sulfate 4 grams -Continue to Monitor and Replete as Necessary -Repeat Mag in AM  Hypohosphetemia -Patient's Phos was 1.0 this AM -Replete with 20 mmol of IV KPhos -Continue to Monitor and Replete as Necessary -Repeat CMP in AM  DVT prophylaxis: SCDs Code Status: FULL CODE Family Communication: Discussed with Family at bedside Disposition Plan: Remain Inpatient   Consultants:   Case was discussed with Neurology   Procedures: None   Antimicrobials:  Anti-infectives    Start     Dose/Rate Route Frequency Ordered Stop   10/22/16 0800  vancomycin (VANCOCIN) IVPB 750 mg/150 ml premix     750 mg 150 mL/hr over 60 Minutes Intravenous Every 12 hours 10/22/16 0021     10/21/16 1745  vancomycin (VANCOCIN) IVPB 1000 mg/200 mL premix     1,000 mg 200 mL/hr over 60 Minutes Intravenous  Once 10/21/16 1734 10/21/16 1854     Subjective: Seen and examined and stated she was in tremendous pain and could not get comfortable or sleep for 3 days. No Nausea or vomiting. Denied CP or SOB.   Objective: Vitals:   10/21/16 2200 10/21/16 2309 10/22/16 0431 10/22/16 1415  BP: 124/69 (!) 104/53 (!) 106/46 (!) 106/57  Pulse: (!) 110 100 (!) 107 (!) 109  Resp: 16 18 18 17   Temp:  99.4 F (37.4 C) 99.9 F (37.7 C) 100.2 F (37.9 C)  TempSrc:  Oral Oral Oral  SpO2: 95% 96% 96% 96%  Weight:      Height:        Intake/Output Summary (Last 24 hours) at 10/22/16 1439 Last data filed at 10/21/16 1854  Gross per 24 hour  Intake             1200 ml  Output                0 ml  Net             1200 ml   Filed Weights   10/21/16 1523 10/21/16 1730  Weight: 65.2 kg (143 lb  11.2 oz) 65.2 kg (143 lb 11.8 oz)   Examination: Physical Exam:  Constitutional: WN/WD Caucasian female appears uncomfortable and in pain Eyes: Lids and conjunctivae normal, sclerae anicteric  ENMT: External Ears, Nose appear normal. Grossly normal hearing.   Neck: Appears normal, supple, no cervical masses, normal ROM, no appreciable thyromegaly, no JVD Respiratory: Diminished to auscultation bilaterally, no wheezing, rales, rhonchi or crackles. Normal respiratory effort and patient is not tachypenic. No accessory muscle use.  Cardiovascular: Tachycardic Rate but regular Rhythm, no murmurs / rubs / gallops. S1 and S2 auscultated. No extremity edema. Abdomen: Soft, non-tender, non-distended. No masses palpated. No appreciable hepatosplenomegaly. Bowel sounds positive.  GU: Deferred. Musculoskeletal: No clubbing / cyanosis of digits/nails. No joint deformity upper and lower extremities. Good ROM, no contractures.  Skin: Left sided back incision with induration, erythema and warmth with some purulence.  No rashes or lesions appreciated.   Neurologic: CN 2-12 grossly intact with no focal deficits. Sensation intact in all 4 Extremities.. Strength 5/5 in all 4. Romberg sign cerebellar reflexes not assessed.  Psychiatric: Normal judgment and insight. Alert and oriented x 3. Anxious mood and appropriate affect.   Data Reviewed: I have personally reviewed following labs and imaging studies  CBC:  Recent Labs Lab 10/21/16 1631 10/22/16 0239  WBC 24.6* 22.8*  NEUTROABS 20.8*  --   HGB 11.9* 10.5*  HCT 34.7* 30.9*  MCV 94.3 94.8  PLT 214 803   Basic Metabolic Panel:  Recent Labs Lab 10/21/16 1610 10/21/16 1631 10/22/16 0239  NA  --  136 133*  K  --  2.8* 3.2*  CL  --  99* 101  CO2  --  24 26  GLUCOSE  --  128* 107*  BUN  --  14 6  CREATININE  --  0.79 0.76  CALCIUM  --  8.7* 7.8*  MG 1.2*  --  1.0*  PHOS 2.6  --  2.1*   GFR: Estimated Creatinine Clearance: 69.5 mL/min (by C-G  formula based on SCr of 0.76 mg/dL). Liver Function Tests:  Recent Labs Lab 10/22/16 0239  AST 12*  ALT 9*  ALKPHOS 49  BILITOT 1.0  PROT 5.8*  ALBUMIN 2.8*   No results for input(s): LIPASE, AMYLASE in the last 168 hours. No results for input(s): AMMONIA in the last 168 hours. Coagulation Profile:  Recent Labs Lab 10/21/16 2348  INR 1.07   Cardiac Enzymes: No results for input(s): CKTOTAL, CKMB, CKMBINDEX, TROPONINI in the last 168 hours. BNP (last 3 results) No results for input(s): PROBNP in the last 8760 hours. HbA1C: No results for input(s): HGBA1C in the last 72 hours. CBG: No results for input(s): GLUCAP in the last 168 hours. Lipid Profile: No results for input(s): CHOL, HDL, LDLCALC, TRIG, CHOLHDL, LDLDIRECT in the last 72 hours. Thyroid Function Tests:  Recent Labs  10/22/16 0239  TSH 1.654   Anemia Panel: No results for input(s): VITAMINB12, FOLATE, FERRITIN, TIBC, IRON, RETICCTPCT in the last 72 hours. Sepsis Labs:  Recent Labs Lab 10/21/16 1637 10/21/16 2348 10/22/16 0239  PROCALCITON  --  0.21  --   LATICACIDVEN 1.65 2.3* 0.9    Recent Results (from the past 240 hour(s))  Blood culture (routine x 2)     Status: None (Preliminary result)   Collection Time: 10/21/16  5:40 PM  Result Value Ref Range Status   Specimen Description BLOOD LEFT HAND  Final   Special Requests   Final    BOTTLES DRAWN AEROBIC AND ANAEROBIC Blood Culture adequate volume   Culture NO GROWTH < 24 HOURS  Final   Report Status PENDING  Incomplete  Blood culture (routine x 2)     Status: None (Preliminary result)   Collection Time: 10/21/16  5:45 PM  Result Value Ref Range Status   Specimen Description BLOOD LEFT ANTECUBITAL  Final   Special Requests IN PEDIATRIC BOTTLE Blood Culture adequate volume  Final   Culture NO GROWTH < 24 HOURS  Final   Report Status PENDING  Incomplete  MRSA PCR Screening     Status: None   Collection Time: 10/21/16 11:14 PM  Result Value  Ref Range Status   MRSA by PCR NEGATIVE NEGATIVE Final    Comment:        The GeneXpert MRSA Assay (FDA approved for NASAL specimens only), is one component of a comprehensive MRSA colonization  surveillance program. It is not intended to diagnose MRSA infection nor to guide or monitor treatment for MRSA infections.     Radiology Studies: Ct Chest W Contrast  Result Date: 10/21/2016 CLINICAL DATA:  Postoperative infection, acute onset, status post resection of melanoma at the upper back. Erythema and swelling about the surgical site. Fatigue and body aches. Leukocytosis. Initial encounter. EXAM: CT CHEST WITH CONTRAST TECHNIQUE: Multidetector CT imaging of the chest was performed during intravenous contrast administration. CONTRAST:  42m ISOVUE-300 IOPAMIDOL (ISOVUE-300) INJECTION 61% COMPARISON:  Chest radiograph performed 10/01/2009 FINDINGS: Cardiovascular: The heart is normal in size. The thoracic aorta is unremarkable in appearance. The great vessels are within normal limits. No calcific atherosclerotic disease is seen. Mediastinum/Nodes: No mediastinal lymphadenopathy is appreciated. No pericardial effusion is identified. The thyroid gland is unremarkable. No axillary lymphadenopathy is seen. The patient is status post median sternotomy. Lungs/Pleura: Minimal scarring is noted at the lung bases. No pleural effusion or pneumothorax is seen. No masses are identified. Upper Abdomen: The visualized portions of the liver and spleen are unremarkable. The patient is status post cholecystectomy, with clips noted at the gallbladder fossa. The visualized portions of the pancreas, adrenal glands and kidneys are within normal limits. Musculoskeletal: Soft tissue edema is seen tracking along the mid and left upper back, with mild associated skin thickening. This may reflect cellulitis, given the patient's symptoms. No abscess is seen at this time. No postoperative seroma is identified. No acute osseous  abnormalities are identified. The visualized musculature is unremarkable in appearance. IMPRESSION: 1. Soft tissue edema along the mid and left upper back, with mild associated skin thickening. This may reflect cellulitis, given the patient's symptoms. No evidence of abscess or postoperative seroma at this time. 2. Minimal scarring at the lung bases.  Lungs otherwise clear. Electronically Signed   By: JGarald BaldingM.D.   On: 10/21/2016 23:08   Dg Chest Port 1 View  Result Date: 10/22/2016 CLINICAL DATA:  Sepsis.  Back infection after melanoma removal. EXAM: PORTABLE CHEST 1 VIEW COMPARISON:  CT chest October 21, 2016 FINDINGS: Cardiomediastinal silhouette is normal for this low inspiratory portable examination with crowded vascular markings. Bibasilar strandy densities without pleural effusion or focal consolidation. Mild bronchitic changes. Status post median sternotomy for CABG with multiple fractured wires. Soft tissue planes and included osseous structures are nonsuspicious. IMPRESSION: Mild bronchitic changes with bibasilar atelectasis. Electronically Signed   By: CElon AlasM.D.   On: 10/22/2016 00:54   Scheduled Meds: . citalopram  40 mg Oral Daily  . desmopressin  0.3 mg Oral Daily  . estradiol  2 mg Oral BID  . famotidine  20 mg Oral QHS  . ketorolac  30 mg Intravenous Once  . Mesalamine  800 mg Oral BID  . mycophenolate  500 mg Oral BID  . pantoprazole  40 mg Oral Daily  . potassium chloride  40 mEq Oral Daily  . predniSONE  7.5 mg Oral Q breakfast  . progesterone  100 mg Oral QHS  . rOPINIRole  1-2 mg Oral QHS  . senna  1 tablet Oral BID   Continuous Infusions: . sodium chloride    . potassium PHOSPHATE IVPB (mmol)    . vancomycin Stopped (10/22/16 1004)    LOS: 1 day   OKerney Elbe DO [facility] Pager [phone] If 7PM-7AM, please contact night-coverage [email or web address] Password [email or web address]8/07/2016, 2:39 PM

## 2016-10-23 DIAGNOSIS — N179 Acute kidney failure, unspecified: Secondary | ICD-10-CM

## 2016-10-23 LAB — CBC WITH DIFFERENTIAL/PLATELET
Basophils Absolute: 0 10*3/uL (ref 0.0–0.1)
Basophils Relative: 0 %
Eosinophils Absolute: 0.3 10*3/uL (ref 0.0–0.7)
Eosinophils Relative: 2 %
HEMATOCRIT: 29.1 % — AB (ref 36.0–46.0)
HEMOGLOBIN: 9.8 g/dL — AB (ref 12.0–15.0)
LYMPHS ABS: 0.9 10*3/uL (ref 0.7–4.0)
LYMPHS PCT: 5 %
MCH: 31.8 pg (ref 26.0–34.0)
MCHC: 33.7 g/dL (ref 30.0–36.0)
MCV: 94.5 fL (ref 78.0–100.0)
MONO ABS: 1 10*3/uL (ref 0.1–1.0)
MONOS PCT: 6 %
NEUTROS ABS: 15.1 10*3/uL — AB (ref 1.7–7.7)
NEUTROS PCT: 87 %
Platelets: 217 10*3/uL (ref 150–400)
RBC: 3.08 MIL/uL — ABNORMAL LOW (ref 3.87–5.11)
RDW: 14.2 % (ref 11.5–15.5)
WBC: 17.3 10*3/uL — ABNORMAL HIGH (ref 4.0–10.5)

## 2016-10-23 LAB — COMPREHENSIVE METABOLIC PANEL
ALBUMIN: 2.4 g/dL — AB (ref 3.5–5.0)
ALK PHOS: 65 U/L (ref 38–126)
ALT: 9 U/L — ABNORMAL LOW (ref 14–54)
ANION GAP: 8 (ref 5–15)
AST: 19 U/L (ref 15–41)
BILIRUBIN TOTAL: 1.1 mg/dL (ref 0.3–1.2)
BUN: 14 mg/dL (ref 6–20)
CALCIUM: 7.6 mg/dL — AB (ref 8.9–10.3)
CO2: 24 mmol/L (ref 22–32)
Chloride: 99 mmol/L — ABNORMAL LOW (ref 101–111)
Creatinine, Ser: 1.02 mg/dL — ABNORMAL HIGH (ref 0.44–1.00)
GFR calc Af Amer: >60 mL/min (ref >60)
GLUCOSE: 144 mg/dL — AB (ref 65–99)
POTASSIUM: 3.2 mmol/L — AB (ref 3.5–5.1)
Sodium: 131 mmol/L — ABNORMAL LOW (ref 135–145)
TOTAL PROTEIN: 5.7 g/dL — AB (ref 6.5–8.1)

## 2016-10-23 LAB — PHOSPHORUS: Phosphorus: 2 mg/dL — ABNORMAL LOW (ref 2.5–4.6)

## 2016-10-23 LAB — MAGNESIUM: MAGNESIUM: 2.1 mg/dL (ref 1.7–2.4)

## 2016-10-23 MED ORDER — ADULT MULTIVITAMIN W/MINERALS CH
1.0000 | ORAL_TABLET | Freq: Every day | ORAL | Status: DC
  Administered 2016-10-23 – 2016-10-28 (×6): 1 via ORAL
  Filled 2016-10-23 (×6): qty 1

## 2016-10-23 MED ORDER — HYDROMORPHONE HCL 1 MG/ML IJ SOLN
1.0000 mg | INTRAMUSCULAR | Status: DC | PRN
  Administered 2016-10-23 – 2016-10-28 (×14): 1 mg via INTRAVENOUS
  Filled 2016-10-23 (×15): qty 1

## 2016-10-23 MED ORDER — DEXTROSE 5 % IV SOLN
20.0000 mmol | Freq: Once | INTRAVENOUS | Status: AC
  Administered 2016-10-23: 20 mmol via INTRAVENOUS
  Filled 2016-10-23: qty 6.67

## 2016-10-23 MED ORDER — PROMETHAZINE HCL 25 MG/ML IJ SOLN
12.5000 mg | Freq: Three times a day (TID) | INTRAMUSCULAR | Status: DC | PRN
  Administered 2016-10-24: 12.5 mg via INTRAVENOUS
  Filled 2016-10-23: qty 1

## 2016-10-23 MED ORDER — BOOST / RESOURCE BREEZE PO LIQD
1.0000 | Freq: Three times a day (TID) | ORAL | Status: DC
  Administered 2016-10-23 – 2016-10-26 (×3): 1 via ORAL

## 2016-10-23 MED ORDER — DEXTROSE 5 % IV SOLN
1.0000 g | Freq: Three times a day (TID) | INTRAVENOUS | Status: DC
  Administered 2016-10-23 – 2016-10-26 (×9): 1 g via INTRAVENOUS
  Filled 2016-10-23 (×12): qty 1

## 2016-10-23 MED ORDER — POTASSIUM CHLORIDE CRYS ER 20 MEQ PO TBCR
40.0000 meq | EXTENDED_RELEASE_TABLET | Freq: Two times a day (BID) | ORAL | Status: DC
  Administered 2016-10-23 – 2016-10-25 (×5): 40 meq via ORAL
  Filled 2016-10-23 (×6): qty 2

## 2016-10-23 NOTE — Progress Notes (Signed)
Pharmacy Antibiotic Note  Teresa Lee is a 56 y.o. female admitted on 10/21/2016 with cellulitis, wound infection s/p recent melanoma removal outpatient.  Patient was initially started on Vancomycin and Zosyn but with slight increase in SCr, will change abx to Vancomycin and Cefepime.    Tmax 100.2 (8/5 at 1400), WBC 17.3 (improved), LA 2.3>0.9  Plan: Continue Vancomycin 750mg  IV Q12H Add Cefepime 1gm IV q8h. F/u renal fxn, C&S, clinical status trough at SS  Height: 5\' 1"  (154.9 cm) Weight: 143 lb 11.8 oz (65.2 kg) IBW/kg (Calculated) : 47.8  Temp (24hrs), Avg:99.3 F (37.4 C), Min:98.4 F (36.9 C), Max:100.2 F (37.9 C)   Recent Labs Lab 10/21/16 1631 10/21/16 1637 10/21/16 2348 10/22/16 0239 10/23/16 0713  WBC 24.6*  --   --  22.8* 17.3*  CREATININE 0.79  --   --  0.76 1.02*  LATICACIDVEN  --  1.65 2.3* 0.9  --     Estimated Creatinine Clearance: 54.5 mL/min (A) (by C-G formula based on SCr of 1.02 mg/dL (H)).    Allergies  Allergen Reactions  . Percocet [Oxycodone-Acetaminophen] Hives  . Sulfamethoxazole-Trimethoprim Hives  . Nucynta [Tapentadol]     Headache   . Orange Concentrate [Flavoring Agent]     Acid reflux  . Other     Some nuts cause a rash  . Tomato     Burns skin  . Cefixime     Due to myasthenia gravis    Antimicrobials this admission: Vanc 8/4>> Zosyn 8/5 >> 8/6 Cefepime 8/6 >>   Dose adjustments this admission: N/A  Microbiology results: 8/4 MRSA PCR >> negative 8/4 blood x 2 >> ngtd  Thank you for allowing pharmacy to be a part of this patient's care.  [employer], Pharm.D., BCPS Clinical Pharmacist Pager: [phone] Clinical phone for 10/23/2016 from 8:30-4:00 is [phone]. After 4pm, please call Main Rx [phone]) for assistance. 10/23/2016 11:09 AM

## 2016-10-23 NOTE — Progress Notes (Signed)
Initial Nutrition Assessment  DOCUMENTATION CODES:   Not applicable  INTERVENTION:   -Boost Breeze po TID, each supplement provides 250 kcal and 9 grams of protein -MVI daily  NUTRITION DIAGNOSIS:   Inadequate oral intake related to poor appetite (pain) as evidenced by meal completion < 50%.  GOAL:   Patient will meet greater than or equal to 90% of their needs  MONITOR:   PO intake, Supplement acceptance, Labs, Weight trends, Skin, I & O's  REASON FOR ASSESSMENT:   Consult Poor PO  ASSESSMENT:   56 y.o. female with medical history significant of leukocytosis, myasthenia gravis, hyperlipidemia, hypertension, asthma , fibromyalgia, melanoma  Admitted for Wound infection after melanoma resection  Case discussed with RN prior to visit, who reports pt is eating very little. RN trialed [employer] supplement with pt, which pt accepted with much encouragement.   Spoke wit pt and husband at bedside. Pt reports sje generally has a good appetite, consuming 2-3 meals per day. However, pt has eaten very little over the past 4 days due to pain and poor appetite ("I just don't feel like eating"). She confirms that she tried the [employer] ("it was just ok"). She dd not eat breakfast, because she feels more comfortable drinking than eating at this time.   Pt denies wt loss; reports UBW is around 135#, but gained wt recently due to "back shots" which she recently discontinued.   Nutrition-Focused physical exam completed. Findings are no fat depletion, no muscle depletion, and no edema.   Discussed importance of good nutritional intake (meal and supplements) for healing. Reviewed available supplements on formulary. Pt reports she does not like milky-type products and would prefer Boost Breeze. Obtained flavor preferences and discussed importance of consuming supplement in light of poor oral intake.   Medications reviewed and include prednisone.   Labs reviewed: Na: 131 Ion  IV supplementation). K: 3.2 (on PO supplementation), Phos: 2.0 (on IV supplementation).   Diet Order:  Diet regular Room service appropriate? Yes; Fluid consistency: Thin  Skin:   (lt back incision)  Last BM:  10/19/16  Height:   Ht Readings from Last 1 Encounters:  10/21/16 5\' 1"  (1.549 m)    Weight:   Wt Readings from Last 1 Encounters:  10/21/16 143 lb 11.8 oz (65.2 kg)    Ideal Body Weight:  47.7 kg  BMI:  Body mass index is 27.16 kg/m.  Estimated Nutritional Needs:   Kcal:  1650-1850  Protein:  85-100 grams  Fluid:  > 1.6 L  EDUCATION NEEDS:   Education needs addressed  Quyen Cutsforth A. Jimmye Norman, RD, LDN, CDE Pager: [phone] After hours Pager: [phone]

## 2016-10-23 NOTE — Progress Notes (Signed)
PROGRESS NOTE    Teresa Lee  DJS:[id number] DOB: 11-10-1960 DOA: 10/21/2016 PCP: System, Pcp Not In   Brief Narrative:  The patient is a 56 y.o. female with medical history significant of leukocytosis, myasthenia gravis, hyperlipidemia, hypertension, asthma , fibromyalgia, melanoma and other comorbids who presented to the ED with inflammation of her upper back where she have had melanoma removed last week. She states stiches were place and is first was healing well but then she developed some redness and swelling around the site. Initially she required initial incision 2 weeks ago but then had to go back and clear the borders. She patient had bandage over the area when a family member removed today there was a lot of swelling and redness around the site. She's been feeling lightheaded and unwell but no fevers or chills no nausea vomiting. She's been taking her pain medications and higher amounts than usual to control her discomfort and ran out. Patient has chronic leukocytosis but today her white blood cell count is up from baseline up to 24. Admitted for Sepsis 2/2 to Acute Cellulitis and was placed on IV Abx for purulent cellulitis. Patient's Cr went slightly so IV Zosyn was changed to IV Cefepime and IV Fluids were increased to 100 mL/hr.   Assessment & Plan:   Active Problems:   Essential hypertension   Asthma   Leukocytosis   Hypokalemia   Wound infection after surgery   Sepsis ([place])   Myasthenia gravis ([place])   Melanoma ([place])   Crohn disease ([place])   Cellulitis   Hypomagnesemia   Hypophosphatemia  Sepsis 2/2 Acute Cellulitis of the Back -Was tachycardic, had Elevated WBC at 24.6, and had Purulent skin infection -Recently had Melanoma Excised on Thursday -Developed Symptoms and Intractable Pain -Sepsis Physiology improving and WBC decreased .  -C/w IV Vancomycin given purulent discharge; IV Zosyn changed to IV Cefepime given slight elevation on Renal Fxn -CT Chest with Contrast  showed Soft tissue edema along the mid and left upper back, with mild associated skin thickening. This may reflect cellulitis, given the patient's symptoms. No evidence of abscess or postoperative seroma at this time. -CXR showed Cardiomediastinal silhouette is normal for this low inspiratory portable examination with crowded vascular markings. Bibasilar strandy densities without pleural effusion or focal consolidation. Mild bronchitic changes. Status post median sternotomy for CABG with multiple fractured wires. Soft tissue planes and included osseous structures are nonsuspicious. -Lactic Acid went from 2.3 -> 0.9 -Procalcitonin was 0.21 -MRSA Screen Negative -Blood Cx x2 showed NGTD at 2 days -Will change IV Zosyn to IV Cefepime given slight increase in Creatinine  -Spoke with Dr. Ronnald Ramp of Dermatology and he states he will come evaluate the patient and remove some stitches  -IVF with NS at 75 mL/hr increased to 100 mL/hr  Intractable Pain in a Patient with Chronic Pain/Fibromyalgia -C/w Acetaminophen 650 mg po q6hprn -Added back Hydrocodone po 10-325 q6hprn -Increased Hydromorphone from 1 mg q4hprn to q3hprn for Severe Pain ; Add Benadryl as patient had some itching  -Gave one dose of IV Ketorolac 30 mg yesterday and will not given any additional doses   Mild Early AKI -Likely combination of IV Contrast, Ketorolac, IV Vanc/Zosyn -BUN/Cr went from 6/0.76 -> 14/1.02 -Avoid Nephrotoxic Medications -IV Zosyn changed to IV Cefepime -IVF with NS at 75 mL/hr increased to 100 mL/hr -Continue to Monitor and Repeat CMP in AM  Leukocytosis has some Chronic Microcytosis  -Baseline WBC is around 16 per patient -WBC went from 24.6 ->  22.8 -> 17.3 -Currently increased from baseline to secondary to cellulitis -C/w Abx as above -Repeat CBC in AM  Essential Hypertension -BP ranging on the lower side yesterday; Last BP was 132/72  -Not on any current medications   Myasthenia Gravis ([place])    -Admitting Physician Spoke to Neurology  -Continue CellCept 500 mg po BID and Prednisone 7.5 mg po Daily -Currently appears to be stable if decompensates call Neurology for consult  -Follow up with [facility] as an outpateint   Melanoma -Continue to follow-up as an outpatient with Dr. Wilhemina Bonito in Dermatology   Crohn Disease/IBS -Stable  -Continue home medications with Mesalamine 800 mg po BID -C/w Bowel Regimen with Polyethylene Glycol 17 g po Daily prn and Senna 8.6 mg po BID Daily  Asthma -Stable -Continue Albuterol Nebs 2.5 mg q6hprn for Wheezing/SOB  GERD -C/w Pantoprazole 40 mg po Daily and Famotidine 20 mg po qHS  Fibromyalgia/Arthritis/RLS -Pain Control as Above -C/w Citalopram 40 mg po Daily -C/w Ropinirole 1-2 mg po Daily qHS  Hypokalemia -Patient's K+ was 3.2 again this AM -Replete with IV 20 mmol of KPhos and 40 mEQ of po KCL x2  -Continue to Monitor and Replete as Necessary -Repeat CMP in AM  Hypomagnesemia -Patient's Mag  was 1.0 yesterday and improved to 2.1 -Replete with IV Mag Sulfate 4 grams yesterday  -Continue to Monitor and Replete as Necessary -Repeat Mag in AM  Hypohosphetemia -Patient's Phos was 2.0 this AM  -Replete with 20 mmol of IV KPhos -Continue to Monitor and Replete as Necessary -Repeat CMP in AM  DVT prophylaxis: SCDs Code Status: FULL CODE Family Communication: Discussed with Mother at bedside Disposition Plan: Remain Inpatient   Consultants:   Case was discussed with Neurology  Called and spoke with Dr. Wilhemina Bonito of Dermatology and he will come evaluate the stitches of the patient   Procedures: None   Antimicrobials:  Anti-infectives    Start     Dose/Rate Route Frequency Ordered Stop   10/23/16 1000  ceFEPIme (MAXIPIME) 1 g in dextrose 5 % 50 mL IVPB     1 g 100 mL/hr over 30 Minutes Intravenous Every 8 hours 10/23/16 0930     10/22/16 1500  piperacillin-tazobactam (ZOSYN) IVPB 3.375 g  Status:  Discontinued      3.375 g 12.5 mL/hr over 240 Minutes Intravenous Every 8 hours 10/22/16 1451 10/23/16 0830   10/22/16 0800  vancomycin (VANCOCIN) IVPB 750 mg/150 ml premix     750 mg 150 mL/hr over 60 Minutes Intravenous Every 12 hours 10/22/16 0021     10/21/16 1745  vancomycin (VANCOCIN) IVPB 1000 mg/200 mL premix     1,000 mg 200 mL/hr over 60 Minutes Intravenous  Once 10/21/16 1734 10/21/16 1854     Subjective: Seen and examined and stated she was still in a lot of pain. Mother thinks she rested better last night. No CP or SOB but stated she only urinated one time yesterday. Was told if she has trouble urinating to let the nurse know so we could obtain a bladder scan. No lightheadedness or dizziness. No other concerns or complaints at this time.   Objective: Vitals:   10/22/16 2210 10/23/16 0043 10/23/16 0530 10/23/16 1304  BP: (!) 107/56 (!) 105/51 (!) 108/58 132/72  Pulse: 97 98 (!) 105 (!) 103  Resp: 16 17 17 20   Temp: 98.4 F (36.9 C) 98.5 F (36.9 C) 100.1 F (37.8 C) 99.1 F (37.3 C)  TempSrc: Oral Oral Oral  Oral  SpO2: 96% 95% 93% 94%  Weight:      Height:        Intake/Output Summary (Last 24 hours) at 10/23/16 1414 Last data filed at 10/23/16 1344  Gross per 24 hour  Intake             1747 ml  Output              700 ml  Net             1047 ml   Filed Weights   10/21/16 1523 10/21/16 1730  Weight: 65.2 kg (143 lb 11.2 oz) 65.2 kg (143 lb 11.8 oz)   Examination: Physical Exam:  Constitutional: WN/WD Caucasian female who appeared to be uncomfortable with back pain  Eyes: Sclerae anicteric; Conjunctivae Non-injected ENMT: Grossly normal hearing; Mucous membranes were slightly dry  Neck: Supple with no visible JVD Respiratory: CTAB; No wheezing/rales/rhonchi. Patient was not tachypenic or using any accessory muscles to breathe Cardiovascular: RRR; S1 S2; No appreciable Extremity edema Abdomen: Soft, NT, ND. Bowel Sounds present in all 4 quadrants GU:  Deferred Musculoskeletal: No Contractures; No Cyanosis Skin:  Warm and Dry; Posterior Left Upper Back incision with some dry puss in between stitches and less erythematous and warm to the touch Neurologic: CN 2-12 grossly intact. No appreciable focal deficits Psychiatric: Anxious Mood and Affect. Intact judgement and Insight. Awake and Alert and Oriented x3.  Data Reviewed: I have personally reviewed following labs and imaging studies  CBC:  Recent Labs Lab 10/21/16 1631 10/22/16 0239 10/23/16 0713  WBC 24.6* 22.8* 17.3*  NEUTROABS 20.8*  --  15.1*  HGB 11.9* 10.5* 9.8*  HCT 34.7* 30.9* 29.1*  MCV 94.3 94.8 94.5  PLT 214 204 646   Basic Metabolic Panel:  Recent Labs Lab 10/21/16 1610 10/21/16 1631 10/22/16 0239 10/23/16 0713  NA  --  136 133* 131*  K  --  2.8* 3.2* 3.2*  CL  --  99* 101 99*  CO2  --  24 26 24   GLUCOSE  --  128* 107* 144*  BUN  --  14 6 14   CREATININE  --  0.79 0.76 1.02*  CALCIUM  --  8.7* 7.8* 7.6*  MG 1.2*  --  1.0* 2.1  PHOS 2.6  --  2.1* 2.0*   GFR: Estimated Creatinine Clearance: 54.5 mL/min (A) (by C-G formula based on SCr of 1.02 mg/dL (H)). Liver Function Tests:  Recent Labs Lab 10/22/16 0239 10/23/16 0713  AST 12* 19  ALT 9* 9*  ALKPHOS 49 65  BILITOT 1.0 1.1  PROT 5.8* 5.7*  ALBUMIN 2.8* 2.4*   No results for input(s): LIPASE, AMYLASE in the last 168 hours. No results for input(s): AMMONIA in the last 168 hours. Coagulation Profile:  Recent Labs Lab 10/21/16 2348  INR 1.07   Cardiac Enzymes: No results for input(s): CKTOTAL, CKMB, CKMBINDEX, TROPONINI in the last 168 hours. BNP (last 3 results) No results for input(s): PROBNP in the last 8760 hours. HbA1C: No results for input(s): HGBA1C in the last 72 hours. CBG: No results for input(s): GLUCAP in the last 168 hours. Lipid Profile: No results for input(s): CHOL, HDL, LDLCALC, TRIG, CHOLHDL, LDLDIRECT in the last 72 hours. Thyroid Function Tests:  Recent Labs   10/22/16 0239  TSH 1.654   Anemia Panel: No results for input(s): VITAMINB12, FOLATE, FERRITIN, TIBC, IRON, RETICCTPCT in the last 72 hours. Sepsis Labs:  Recent Labs Lab 10/21/16 1637 10/21/16 2348 10/22/16 0239  PROCALCITON  --  0.21  --   LATICACIDVEN 1.65 2.3* 0.9    Recent Results (from the past 240 hour(s))  Blood culture (routine x 2)     Status: None (Preliminary result)   Collection Time: 10/21/16  5:40 PM  Result Value Ref Range Status   Specimen Description BLOOD LEFT HAND  Final   Special Requests   Final    BOTTLES DRAWN AEROBIC AND ANAEROBIC Blood Culture adequate volume   Culture NO GROWTH 2 DAYS  Final   Report Status PENDING  Incomplete  Blood culture (routine x 2)     Status: None (Preliminary result)   Collection Time: 10/21/16  5:45 PM  Result Value Ref Range Status   Specimen Description BLOOD LEFT ANTECUBITAL  Final   Special Requests IN PEDIATRIC BOTTLE Blood Culture adequate volume  Final   Culture NO GROWTH 2 DAYS  Final   Report Status PENDING  Incomplete  MRSA PCR Screening     Status: None   Collection Time: 10/21/16 11:14 PM  Result Value Ref Range Status   MRSA by PCR NEGATIVE NEGATIVE Final    Comment:        The GeneXpert MRSA Assay (FDA approved for NASAL specimens only), is one component of a comprehensive MRSA colonization surveillance program. It is not intended to diagnose MRSA infection nor to guide or monitor treatment for MRSA infections.     Radiology Studies: Ct Chest W Contrast  Result Date: 10/21/2016 CLINICAL DATA:  Postoperative infection, acute onset, status post resection of melanoma at the upper back. Erythema and swelling about the surgical site. Fatigue and body aches. Leukocytosis. Initial encounter. EXAM: CT CHEST WITH CONTRAST TECHNIQUE: Multidetector CT imaging of the chest was performed during intravenous contrast administration. CONTRAST:  35m ISOVUE-300 IOPAMIDOL (ISOVUE-300) INJECTION 61% COMPARISON:  Chest  radiograph performed 10/01/2009 FINDINGS: Cardiovascular: The heart is normal in size. The thoracic aorta is unremarkable in appearance. The great vessels are within normal limits. No calcific atherosclerotic disease is seen. Mediastinum/Nodes: No mediastinal lymphadenopathy is appreciated. No pericardial effusion is identified. The thyroid gland is unremarkable. No axillary lymphadenopathy is seen. The patient is status post median sternotomy. Lungs/Pleura: Minimal scarring is noted at the lung bases. No pleural effusion or pneumothorax is seen. No masses are identified. Upper Abdomen: The visualized portions of the liver and spleen are unremarkable. The patient is status post cholecystectomy, with clips noted at the gallbladder fossa. The visualized portions of the pancreas, adrenal glands and kidneys are within normal limits. Musculoskeletal: Soft tissue edema is seen tracking along the mid and left upper back, with mild associated skin thickening. This may reflect cellulitis, given the patient's symptoms. No abscess is seen at this time. No postoperative seroma is identified. No acute osseous abnormalities are identified. The visualized musculature is unremarkable in appearance. IMPRESSION: 1. Soft tissue edema along the mid and left upper back, with mild associated skin thickening. This may reflect cellulitis, given the patient's symptoms. No evidence of abscess or postoperative seroma at this time. 2. Minimal scarring at the lung bases.  Lungs otherwise clear. Electronically Signed   By: JGarald BaldingM.D.   On: 10/21/2016 23:08   Dg Chest Port 1 View  Result Date: 10/22/2016 CLINICAL DATA:  Sepsis.  Back infection after melanoma removal. EXAM: PORTABLE CHEST 1 VIEW COMPARISON:  CT chest October 21, 2016 FINDINGS: Cardiomediastinal silhouette is normal for this low inspiratory portable examination with crowded vascular markings. Bibasilar strandy densities without pleural effusion or focal  consolidation.  Mild bronchitic changes. Status post median sternotomy for CABG with multiple fractured wires. Soft tissue planes and included osseous structures are nonsuspicious. IMPRESSION: Mild bronchitic changes with bibasilar atelectasis. Electronically Signed   By: Elon Alas M.D.   On: 10/22/2016 00:54   Scheduled Meds: . citalopram  40 mg Oral Daily  . desmopressin  0.3 mg Oral Daily  . estradiol  2 mg Oral BID  . famotidine  20 mg Oral QHS  . feeding supplement  1 Container Oral TID BM  . Mesalamine  800 mg Oral BID  . multivitamin with minerals  1 tablet Oral Daily  . mycophenolate  500 mg Oral BID  . pantoprazole  40 mg Oral Daily  . potassium chloride  40 mEq Oral BID  . predniSONE  7.5 mg Oral Q breakfast  . progesterone  100 mg Oral QHS  . rOPINIRole  1-2 mg Oral QHS  . senna  1 tablet Oral BID   Continuous Infusions: . sodium chloride 1,000 mL (10/23/16 0904)  . ceFEPime (MAXIPIME) IV Stopped (10/23/16 1109)  . potassium PHOSPHATE IVPB (mmol) 20 mmol (10/23/16 1119)  . vancomycin Stopped (10/23/16 0942)    LOS: 2 days   Kerney Elbe, DO [facility] Pager [phone]  If 7PM-7AM, please contact night-coverage [email or web address] Password [email or web address] 10/23/2016, 2:14 PM

## 2016-10-24 DIAGNOSIS — E871 Hypo-osmolality and hyponatremia: Secondary | ICD-10-CM

## 2016-10-24 LAB — CBC WITH DIFFERENTIAL/PLATELET
BASOS PCT: 0 %
Basophils Absolute: 0 10*3/uL (ref 0.0–0.1)
EOS ABS: 0.3 10*3/uL (ref 0.0–0.7)
Eosinophils Relative: 2 %
HCT: 27.8 % — ABNORMAL LOW (ref 36.0–46.0)
HEMOGLOBIN: 9.4 g/dL — AB (ref 12.0–15.0)
LYMPHS ABS: 0.8 10*3/uL (ref 0.7–4.0)
LYMPHS PCT: 6 %
MCH: 31.9 pg (ref 26.0–34.0)
MCHC: 33.8 g/dL (ref 30.0–36.0)
MCV: 94.2 fL (ref 78.0–100.0)
MONO ABS: 0.7 10*3/uL (ref 0.1–1.0)
Monocytes Relative: 5 %
NEUTROS ABS: 11.5 10*3/uL — AB (ref 1.7–7.7)
Neutrophils Relative %: 87 %
Platelets: 216 10*3/uL (ref 150–400)
RBC: 2.95 MIL/uL — ABNORMAL LOW (ref 3.87–5.11)
RDW: 14 % (ref 11.5–15.5)
WBC: 13.3 10*3/uL — ABNORMAL HIGH (ref 4.0–10.5)

## 2016-10-24 LAB — COMPREHENSIVE METABOLIC PANEL
ALK PHOS: 71 U/L (ref 38–126)
ALT: 11 U/L — ABNORMAL LOW (ref 14–54)
ANION GAP: 7 (ref 5–15)
AST: 19 U/L (ref 15–41)
Albumin: 2.2 g/dL — ABNORMAL LOW (ref 3.5–5.0)
BUN: 7 mg/dL (ref 6–20)
CALCIUM: 7.3 mg/dL — AB (ref 8.9–10.3)
CO2: 23 mmol/L (ref 22–32)
Chloride: 103 mmol/L (ref 101–111)
Creatinine, Ser: 0.62 mg/dL (ref 0.44–1.00)
GFR calc non Af Amer: >60 mL/min (ref >60)
Glucose, Bld: 110 mg/dL — ABNORMAL HIGH (ref 65–99)
POTASSIUM: 4.3 mmol/L (ref 3.5–5.1)
Sodium: 133 mmol/L — ABNORMAL LOW (ref 135–145)
TOTAL PROTEIN: 5.4 g/dL — AB (ref 6.5–8.1)
Total Bilirubin: 0.9 mg/dL (ref 0.3–1.2)

## 2016-10-24 LAB — MAGNESIUM: Magnesium: 1.7 mg/dL (ref 1.7–2.4)

## 2016-10-24 LAB — PHOSPHORUS: PHOSPHORUS: 1.4 mg/dL — AB (ref 2.5–4.6)

## 2016-10-24 MED ORDER — PROMETHAZINE HCL 25 MG/ML IJ SOLN: 25.0000 mg | Freq: Three times a day (TID) | INTRAMUSCULAR | Status: DC | PRN

## 2016-10-24 MED ORDER — BACITRACIN-NEOMYCIN-POLYMYXIN OINTMENT TUBE
TOPICAL_OINTMENT | Freq: Every day | CUTANEOUS | Status: DC
  Administered 2016-10-24 – 2016-10-28 (×5): via TOPICAL
  Filled 2016-10-24: qty 14.17

## 2016-10-24 MED ORDER — KETOROLAC TROMETHAMINE 15 MG/ML IJ SOLN
15.0000 mg | Freq: Once | INTRAMUSCULAR | Status: AC
  Administered 2016-10-24: 15 mg via INTRAVENOUS
  Filled 2016-10-24: qty 1

## 2016-10-24 MED ORDER — SODIUM PHOSPHATES 45 MMOLE/15ML IV SOLN
30.0000 mmol | Freq: Once | INTRAVENOUS | Status: AC
  Administered 2016-10-24: 30 mmol via INTRAVENOUS
  Filled 2016-10-24: qty 10

## 2016-10-24 NOTE — Progress Notes (Signed)
PROGRESS NOTE    Teresa Lee  WIO:[id number] DOB: 1960-07-27 DOA: 10/21/2016 PCP: System, Pcp Not In   Brief Narrative:  The patient is a 55 y.o. female with medical history significant of leukocytosis, myasthenia gravis, hyperlipidemia, hypertension, asthma , fibromyalgia, melanoma and other comorbids who presented to the ED with inflammation of her upper back where she have had melanoma removed last week. She states stiches were place and is first was healing well but then she developed some redness and swelling around the site. Initially she required initial incision 2 weeks ago but then had to go back and clear the borders. She patient had bandage over the area when a family member removed today there was a lot of swelling and redness around the site. She's been feeling lightheaded and unwell but no fevers or chills no nausea vomiting. She's been taking her pain medications and higher amounts than usual to control her discomfort and ran out. Patient has chronic leukocytosis but today her white blood cell count is up from baseline up to 24.   Admitted for Sepsis 2/2 to Acute Cellulitis and was placed on IV Abx for purulent cellulitis. Patient's Cr went slightly so IV Zosyn was changed to IV Cefepime and IV Fluids were increased to 100 mL/hr. Spoke with Dr. Wilhemina Bonito of Dermatology and he came by and removed a few stitches and squeezed out some purulent drainage and sent that for analysis. Patient sill complaining of significant pain and nausea so was given another dose of IV Ketorolac now that her kidney function improved and had her Phenergan increased.   Assessment & Plan:   Active Problems:   Essential hypertension   Asthma   Leukocytosis   Hypokalemia   Wound infection after surgery   Sepsis ([place])   Myasthenia gravis ([place])   Melanoma ([place])   Crohn disease ([place])   Cellulitis   Hypomagnesemia   Hypophosphatemia   AKI (acute kidney injury) ([place])  Sepsis 2/2 Acute Cellulitis of the  Back -Was tachycardic, had Elevated WBC at 24.6, and had Purulent skin infection -Recently had Melanoma Excised on Thursday -Developed Symptoms and Intractable Pain -Sepsis Physiology improving and WBC decreased to 13.3.  -C/w IV Vancomycin given purulent discharge; IV Zosyn changed to IV Cefepime given slight elevation on Renal Fxn on 10/22/16 -CT Chest with Contrast showed Soft tissue edema along the mid and left upper back, with mild associated skin thickening. This may reflect cellulitis, given the patient's symptoms. No evidence of abscess or postoperative seroma at this time. -CXR showed Cardiomediastinal silhouette is normal for this low inspiratory portable examination with crowded vascular markings. Bibasilar strandy densities without pleural effusion or focal consolidation. Mild bronchitic changes. Status post median sternotomy for CABG with multiple fractured wires. Soft tissue planes and included osseous structures are nonsuspicious. -Lactic Acid went from 2.3 -> 0.9 -Procalcitonin was 0.21 -MRSA Screen Negative -WBC went from 24.6 -> 22.8 -> 17.3 -> 13.3 -Blood Cx x2 showed NGTD at 2 days -Spoke with Dr. Ronnald Ramp of Dermatology evaluated  the patient and removed some stitches to express some more purulent discharge;  -Purulent Drainage from Back Swab sent for Aerobic Cx and Gram Stain showed Rare WBC seen and Few Gram Positive Cocci; Cx Re-incubated for better growth.  -IVF with NS at 100 mL/hr decreased to 58m/hr -C/w Antiemetics with Ondansetron 4 mg po/IV and with Promethazine 25 mg IV q8hprn for Refractory N/V -Added Topical Neomycin-Bacitracin-Polymixin Topically Daily to Wound on Back   Intractable Pain in  a Patient with Chronic Pain/Fibromyalgia -C/w Acetaminophen 650 mg po q6hprn -Added back Hydrocodone po 10-325 q6hprn -C/w with Hydromorphone from 1 mg q4hprn to q3hprn for Severe Pain; Add Benadryl as patient had some itching  -Gave one dose of IV Ketorolac 30 mg yesterday;  Gave another dose of IV Ketorolac 15 mg IV once as patient  Mild Early AKI, improved  -Likely combination of IV Contrast, Ketorolac, IV Vanc/Zosyn -BUN/Cr went from 6/0.76 -> 14/1.02 -> 7/0.62 -Avoid Nephrotoxic Medications -IV Zosyn changed to IV Cefepime on 10/22/16 -IVF with NS at 75 mL/hr increased to 100 mL/hr -Continue to Monitor and Repeat CMP in AM  Leukocytosis has some Chronic Microcytosis  -Baseline WBC is around 16 per patient -WBC went from 24.6 -> 22.8 -> 17.3 -> 13.3 -Currently increased from baseline to secondary to cellulitis -C/w Abx as above -Repeat CBC in AM  Essential Hypertension -BP ranging on the lower side yesterday; Last BP was 101/62 -Not on any current medications   Myasthenia Gravis ([place])  -Admitting Physician Spoke to Neurology  -Continue CellCept 500 mg po BID and Prednisone 7.5 mg po Daily -Currently appears to be stable if decompensates call Neurology for consult  -Follow up with [facility] as an outpateint   Melanoma -Continue to follow-up as an outpatient with Dr. Wilhemina Bonito in Dermatology -Dr. Wilhemina Bonito saw patient yesterday and removed a few stitches    Crohn Disease/IBS -Stable  -Continue home medications with Mesalamine 800 mg po BID -C/w Bowel Regimen with Polyethylene Glycol 17 g po Daily prn and Senna 8.6 mg po BID Daily  Asthma -Stable -Continue Albuterol Nebs 2.5 mg q6hprn for Wheezing/SOB  GERD -C/w Pantoprazole 40 mg po Daily and Famotidine 20 mg po qHS  Fibromyalgia/Arthritis/RLS -Pain Control as Above -C/w Citalopram 40 mg po Daily -C/w Ropinirole 1-2 mg po Daily qHS  Hypokalemia -Patient's K+ was 4.3 this AM -Continue to Monitor and Replete as Necessary -Repeat CMP in AM  Hypomagnesemia -Patient's Mag  was 1.7 -Replete with IV Mag Sulfate 4 grams yesterday  -Continue to Monitor and Replete as Necessary -Repeat Mag in AM  Hypohosphetemia -Patient's Phos was 1.4 this AM  -Replete with 30 mmol of IV  NaPhos -Continue to Monitor and Replete as Necessary -Repeat CMP in AM  Hyponatremia -Patient's Na+ was 133 this AM -Replete with Replete with 30 mmol of IV NaPhos and continue with IV NS at 75 mL/hr -Continue to Monitor and Repeat CMP in AM  DVT prophylaxis: SCDs Code Status: FULL CODE Family Communication: Discussed with Husband at bedside Disposition Plan: Remain Inpatient   Consultants:   Case was discussed with Neurology  Dermatology Dr. Wilhemina Bonito   Procedures: None   Antimicrobials:  Anti-infectives    Start     Dose/Rate Route Frequency Ordered Stop   10/23/16 1000  ceFEPIme (MAXIPIME) 1 g in dextrose 5 % 50 mL IVPB     1 g 100 mL/hr over 30 Minutes Intravenous Every 8 hours 10/23/16 0930     10/22/16 1500  piperacillin-tazobactam (ZOSYN) IVPB 3.375 g  Status:  Discontinued     3.375 g 12.5 mL/hr over 240 Minutes Intravenous Every 8 hours 10/22/16 1451 10/23/16 0830   10/22/16 0800  vancomycin (VANCOCIN) IVPB 750 mg/150 ml premix     750 mg 150 mL/hr over 60 Minutes Intravenous Every 12 hours 10/22/16 0021     10/21/16 1745  vancomycin (VANCOCIN) IVPB 1000 mg/200 mL premix     1,000 mg 200 mL/hr over  60 Minutes Intravenous  Once 10/21/16 1734 10/21/16 1854     Subjective: Seen and examined and stated she was still in a lot of tremendous pain. Still very nauseous and not eating very much per husband. Denied any CP or SOB but states did not rest well as she could not get comfortable because of the pain.   Objective: Vitals:   10/23/16 1304 10/23/16 2112 10/24/16 0544 10/24/16 0546  BP: 132/72 116/69 101/62   Pulse: (!) 103 94 89   Resp: 20 18 18    Temp: 99.1 F (37.3 C) 98.4 F (36.9 C) 98.3 F (36.8 C)   TempSrc: Oral Oral Oral   SpO2: 94% 96% 91% 95%  Weight:      Height:        Intake/Output Summary (Last 24 hours) at 10/24/16 1256 Last data filed at 10/24/16 0558  Gross per 24 hour  Intake             3007 ml  Output              600 ml  Net              2407 ml   Filed Weights   10/21/16 1523 10/21/16 1730  Weight: 65.2 kg (143 lb 11.2 oz) 65.2 kg (143 lb 11.8 oz)   Examination: Physical Exam:   Constitutional: Pleasant WN/WD Caucasian female appears uncomfortable and in pain Eyes: Sclerae anicteric; Conjunctivae non-injected ENMT: Grossly normal hearing. Mucous membranes appeared moist Neck: Supple with no visible JVD Respiratory: CTAB; No wheezing/rales/rhonchi. Patient was not tachypenic or using any accessory muscles to breathe Cardiovascular: RRR; S1, S2. No appreciable LE Edema Abdomen: Soft, NT, ND. Bowel sounds positive GU: Deferred Musculoskeletal: No contractures; No cyanosis Skin: Warm and Erythematous posterior Upper Left Back with Incision having some sutures. Induration and swelling appreciated. Wound appeared to be more warm and red today Neurologic: CN 2-12 grossly intact. No appreciable focal deficits Psychiatric: Depressed appearing mood with Flat affect. Intact judgement and insight  Data Reviewed: I have personally reviewed following labs and imaging studies  CBC:  Recent Labs Lab 10/21/16 1631 10/22/16 0239 10/23/16 0713 10/24/16 0440  WBC 24.6* 22.8* 17.3* 13.3*  NEUTROABS 20.8*  --  15.1* 11.5*  HGB 11.9* 10.5* 9.8* 9.4*  HCT 34.7* 30.9* 29.1* 27.8*  MCV 94.3 94.8 94.5 94.2  PLT 214 204 217 102   Basic Metabolic Panel:  Recent Labs Lab 10/21/16 1610 10/21/16 1631 10/22/16 0239 10/23/16 0713 10/24/16 0440  NA  --  136 133* 131* 133*  K  --  2.8* 3.2* 3.2* 4.3  CL  --  99* 101 99* 103  CO2  --  24 26 24 23   GLUCOSE  --  128* 107* 144* 110*  BUN  --  14 6 14 7   CREATININE  --  0.79 0.76 1.02* 0.62  CALCIUM  --  8.7* 7.8* 7.6* 7.3*  MG 1.2*  --  1.0* 2.1 1.7  PHOS 2.6  --  2.1* 2.0* 1.4*   GFR: Estimated Creatinine Clearance: 69.5 mL/min (by C-G formula based on SCr of 0.62 mg/dL). Liver Function Tests:  Recent Labs Lab 10/22/16 0239 10/23/16 0713 10/24/16 0440  AST 12*  19 19  ALT 9* 9* 11*  ALKPHOS 49 65 71  BILITOT 1.0 1.1 0.9  PROT 5.8* 5.7* 5.4*  ALBUMIN 2.8* 2.4* 2.2*   No results for input(s): LIPASE, AMYLASE in the last 168 hours. No results for input(s): AMMONIA in the  last 168 hours. Coagulation Profile:  Recent Labs Lab 10/21/16 2348  INR 1.07   Cardiac Enzymes: No results for input(s): CKTOTAL, CKMB, CKMBINDEX, TROPONINI in the last 168 hours. BNP (last 3 results) No results for input(s): PROBNP in the last 8760 hours. HbA1C: No results for input(s): HGBA1C in the last 72 hours. CBG: No results for input(s): GLUCAP in the last 168 hours. Lipid Profile: No results for input(s): CHOL, HDL, LDLCALC, TRIG, CHOLHDL, LDLDIRECT in the last 72 hours. Thyroid Function Tests:  Recent Labs  10/22/16 0239  TSH 1.654   Anemia Panel: No results for input(s): VITAMINB12, FOLATE, FERRITIN, TIBC, IRON, RETICCTPCT in the last 72 hours. Sepsis Labs:  Recent Labs Lab 10/21/16 1637 10/21/16 2348 10/22/16 0239  PROCALCITON  --  0.21  --   LATICACIDVEN 1.65 2.3* 0.9    Recent Results (from the past 240 hour(s))  Blood culture (routine x 2)     Status: None (Preliminary result)   Collection Time: 10/21/16  5:40 PM  Result Value Ref Range Status   Specimen Description BLOOD LEFT HAND  Final   Special Requests   Final    BOTTLES DRAWN AEROBIC AND ANAEROBIC Blood Culture adequate volume   Culture NO GROWTH 2 DAYS  Final   Report Status PENDING  Incomplete  Blood culture (routine x 2)     Status: None (Preliminary result)   Collection Time: 10/21/16  5:45 PM  Result Value Ref Range Status   Specimen Description BLOOD LEFT ANTECUBITAL  Final   Special Requests IN PEDIATRIC BOTTLE Blood Culture adequate volume  Final   Culture NO GROWTH 2 DAYS  Final   Report Status PENDING  Incomplete  MRSA PCR Screening     Status: None   Collection Time: 10/21/16 11:14 PM  Result Value Ref Range Status   MRSA by PCR NEGATIVE NEGATIVE Final     Comment:        The GeneXpert MRSA Assay (FDA approved for NASAL specimens only), is one component of a comprehensive MRSA colonization surveillance program. It is not intended to diagnose MRSA infection nor to guide or monitor treatment for MRSA infections.   Aerobic Culture (superficial specimen)     Status: None (Preliminary result)   Collection Time: 10/23/16  8:25 PM  Result Value Ref Range Status   Specimen Description WOUND  Final   Special Requests BACK SWAB  Final   Gram Stain RARE WBC SEEN FEW GRAM POSITIVE COCCI   Final   Culture CULTURE REINCUBATED FOR BETTER GROWTH  Final   Report Status PENDING  Incomplete    Radiology Studies: No results found. Scheduled Meds: . citalopram  40 mg Oral Daily  . desmopressin  0.3 mg Oral Daily  . estradiol  2 mg Oral BID  . famotidine  20 mg Oral QHS  . feeding supplement  1 Container Oral TID BM  . Mesalamine  800 mg Oral BID  . multivitamin with minerals  1 tablet Oral Daily  . mycophenolate  500 mg Oral BID  . neomycin-bacitracin-polymyxin   Topical Daily  . pantoprazole  40 mg Oral Daily  . potassium chloride  40 mEq Oral BID  . predniSONE  7.5 mg Oral Q breakfast  . progesterone  100 mg Oral QHS  . rOPINIRole  1-2 mg Oral QHS  . senna  1 tablet Oral BID   Continuous Infusions: . sodium chloride 75 mL/hr at 10/24/16 1055  . ceFEPime (MAXIPIME) IV Stopped (10/24/16 1043)  . sodium  phosphate  Dextrose 5% IVPB 30 mmol (10/24/16 1125)  . vancomycin Stopped (10/24/16 1009)    LOS: 3 days   Kerney Elbe, DO [facility] Pager [phone]  If 7PM-7AM, please contact night-coverage [email or web address] Password [email or web address] 10/24/2016, 12:56 PM

## 2016-10-24 NOTE — Progress Notes (Signed)
Patient seen tonight for wound infection at surgical site on left back.  I removed several superficial sutures last night and released roughly 10 cc of pus. Bacterial culture was sent.    Today the erythema and tenderness had worsened and I elected to remove several deep vicryl sutures from lateral aspect of surgical site.  This was done under local anesthesia with 1 % lidocaine and again released roughly 10 cc of purulent drainag e.    Please continue dressing changes as needed and broad spectrum antibiotics.  I suspect that this will prove to have been a MRSA infection.  Thanks for contacting me regarding her .  Feel free to contact me at [phone]  Dr Wilhemina Bonito

## 2016-10-25 DIAGNOSIS — T814XXA Infection following a procedure, initial encounter: Principal | ICD-10-CM

## 2016-10-25 DIAGNOSIS — C4359 Malignant melanoma of other part of trunk: Secondary | ICD-10-CM

## 2016-10-25 DIAGNOSIS — A419 Sepsis, unspecified organism: Secondary | ICD-10-CM

## 2016-10-25 DIAGNOSIS — L03312 Cellulitis of back [any part except buttock]: Secondary | ICD-10-CM

## 2016-10-25 DIAGNOSIS — J4520 Mild intermittent asthma, uncomplicated: Secondary | ICD-10-CM

## 2016-10-25 DIAGNOSIS — N179 Acute kidney failure, unspecified: Secondary | ICD-10-CM

## 2016-10-25 DIAGNOSIS — K5090 Crohn's disease, unspecified, without complications: Secondary | ICD-10-CM

## 2016-10-25 DIAGNOSIS — I10 Essential (primary) hypertension: Secondary | ICD-10-CM

## 2016-10-25 DIAGNOSIS — E876 Hypokalemia: Secondary | ICD-10-CM

## 2016-10-25 DIAGNOSIS — G7000 Myasthenia gravis without (acute) exacerbation: Secondary | ICD-10-CM

## 2016-10-25 DIAGNOSIS — E871 Hypo-osmolality and hyponatremia: Secondary | ICD-10-CM

## 2016-10-25 LAB — MAGNESIUM: Magnesium: 1.6 mg/dL — ABNORMAL LOW (ref 1.7–2.4)

## 2016-10-25 LAB — CBC WITH DIFFERENTIAL/PLATELET
Basophils Absolute: 0.2 10*3/uL — ABNORMAL HIGH (ref 0.0–0.1)
Basophils Relative: 1 %
EOS PCT: 2 %
Eosinophils Absolute: 0.3 10*3/uL (ref 0.0–0.7)
HEMATOCRIT: 25.3 % — AB (ref 36.0–46.0)
HEMOGLOBIN: 8.3 g/dL — AB (ref 12.0–15.0)
LYMPHS ABS: 1.4 10*3/uL (ref 0.7–4.0)
Lymphocytes Relative: 9 %
MCH: 31.3 pg (ref 26.0–34.0)
MCHC: 32.8 g/dL (ref 30.0–36.0)
MCV: 95.5 fL (ref 78.0–100.0)
MONOS PCT: 8 %
Monocytes Absolute: 1.2 10*3/uL — ABNORMAL HIGH (ref 0.1–1.0)
NEUTROS ABS: 12.4 10*3/uL — AB (ref 1.7–7.7)
Neutrophils Relative %: 80 %
Platelets: 231 10*3/uL (ref 150–400)
RBC: 2.65 MIL/uL — ABNORMAL LOW (ref 3.87–5.11)
RDW: 14.3 % (ref 11.5–15.5)
WBC: 15.5 10*3/uL — ABNORMAL HIGH (ref 4.0–10.5)

## 2016-10-25 LAB — COMPREHENSIVE METABOLIC PANEL
ALK PHOS: 70 U/L (ref 38–126)
ALT: 10 U/L — ABNORMAL LOW (ref 14–54)
ANION GAP: 7 (ref 5–15)
AST: 16 U/L (ref 15–41)
Albumin: 2.2 g/dL — ABNORMAL LOW (ref 3.5–5.0)
BILIRUBIN TOTAL: 0.5 mg/dL (ref 0.3–1.2)
BUN: 6 mg/dL (ref 6–20)
CALCIUM: 7.2 mg/dL — AB (ref 8.9–10.3)
CHLORIDE: 107 mmol/L (ref 101–111)
CO2: 22 mmol/L (ref 22–32)
CREATININE: 0.62 mg/dL (ref 0.44–1.00)
GFR calc non Af Amer: >60 mL/min (ref >60)
GLUCOSE: 93 mg/dL (ref 65–99)
Potassium: 4.2 mmol/L (ref 3.5–5.1)
Sodium: 136 mmol/L (ref 135–145)
Total Protein: 5.2 g/dL — ABNORMAL LOW (ref 6.5–8.1)

## 2016-10-25 LAB — VANCOMYCIN, TROUGH: VANCOMYCIN TR: 10 ug/mL — AB (ref 15–20)

## 2016-10-25 LAB — PHOSPHORUS: PHOSPHORUS: 1.5 mg/dL — AB (ref 2.5–4.6)

## 2016-10-25 MED ORDER — VANCOMYCIN HCL IN DEXTROSE 1-5 GM/200ML-% IV SOLN
1000.0000 mg | Freq: Two times a day (BID) | INTRAVENOUS | Status: DC
  Administered 2016-10-25 – 2016-10-27 (×4): 1000 mg via INTRAVENOUS
  Filled 2016-10-25 (×4): qty 200

## 2016-10-25 MED ORDER — POTASSIUM & SODIUM PHOSPHATES 280-160-250 MG PO PACK
1.0000 | PACK | Freq: Three times a day (TID) | ORAL | Status: DC
  Administered 2016-10-25: 1 via ORAL
  Filled 2016-10-25 (×3): qty 1

## 2016-10-25 MED ORDER — LIDOCAINE 5 % EX PTCH
1.0000 | MEDICATED_PATCH | CUTANEOUS | Status: DC
  Administered 2016-10-25 – 2016-10-28 (×4): 1 via TRANSDERMAL
  Filled 2016-10-25 (×4): qty 1

## 2016-10-25 MED ORDER — LORAZEPAM 2 MG/ML IJ SOLN
0.5000 mg | Freq: Once | INTRAMUSCULAR | Status: AC
  Administered 2016-10-25: 0.5 mg via INTRAVENOUS
  Filled 2016-10-25: qty 1

## 2016-10-25 MED ORDER — MAGNESIUM SULFATE 2 GM/50ML IV SOLN
2.0000 g | Freq: Once | INTRAVENOUS | Status: AC
  Administered 2016-10-25: 2 g via INTRAVENOUS
  Filled 2016-10-25: qty 50

## 2016-10-25 MED ORDER — KETOROLAC TROMETHAMINE 15 MG/ML IJ SOLN
15.0000 mg | Freq: Two times a day (BID) | INTRAMUSCULAR | Status: DC | PRN
  Administered 2016-10-25: 15 mg via INTRAVENOUS
  Filled 2016-10-25: qty 1

## 2016-10-25 NOTE — Care Management Important Message (Signed)
Important Message  Patient Details  Name: Teresa Lee MRN: [id number] Date of Birth: 06/14/1960   Medicare Important Message Given:  Yes    Etola Mull Abena 10/25/2016, 10:39 AM

## 2016-10-25 NOTE — Progress Notes (Signed)
Pharmacy Antibiotic Note  Teresa Lee is a 56 y.o. female admitted on 10/21/2016 with cellulitis, wound infection s/p recent melanoma removal outpatient.  Patient was initially started on Vancomycin and Zosyn but with slight increase in SCr, was changed to Vancomycin and Cefepime.    MD expressed some pus at bedside on 8/6 and 8/7 which is pending cx.  Renal function has remained stable since abx changed to Vanc/Cefepime.  Today is total abx day # 5.  Vancomycin trough this morning = 10 (drawn ~ 1.5 hours early, goal 10-15).  Will increase dose to target mid-range of trough goal.   Plan: Increase Vancomycin to 1000 mg IV q12 (estimated trough 12.2) Cont Cefepime 1gm IV q8h. F/u wound cx F/u renal fxn, C&S, clinical status and trough at [facility] Replace Phos?  Mg? Watch K on scheduled Kdrur 40 BID   Height: 5\' 1"  (154.9 cm) Weight: 143 lb 11.8 oz (65.2 kg) IBW/kg (Calculated) : 47.8  Temp (24hrs), Avg:98.2 F (36.8 C), Min:98 F (36.7 C), Max:98.3 F (36.8 C)   Recent Labs Lab 10/21/16 1631 10/21/16 1637 10/21/16 2348 10/22/16 0239 10/23/16 0713 10/24/16 0440 10/25/16 0636  WBC 24.6*  --   --  22.8* 17.3* 13.3* 15.5*  CREATININE 0.79  --   --  0.76 1.02* 0.62 0.62  LATICACIDVEN  --  1.65 2.3* 0.9  --   --   --   VANCOTROUGH  --   --   --   --   --   --  10*    Estimated Creatinine Clearance: 69.5 mL/min (by C-G formula based on SCr of 0.62 mg/dL).    Allergies  Allergen Reactions  . Percocet [Oxycodone-Acetaminophen] Hives  . Sulfamethoxazole-Trimethoprim Hives  . Nucynta [Tapentadol]     Headache   . Orange Concentrate [Flavoring Agent]     Acid reflux  . Other     Some nuts cause a rash  . Tomato     Burns skin  . Cefixime     Due to myasthenia gravis    Antimicrobials this admission: Vanc 8/4>> Zosyn 8/5 >> 8/6 Cefepime 8/6 >>   Dose adjustments this admission: 8/8 VT = 10 (drawn ~ 1.5h early)  Will increase dose to target mid range 10-15 for cellulitis.   Est tr 12.2 on 1gm q12h.  Microbiology results: 8/4 MRSA PCR >> negative 8/4 blood x 2 >> ngtd 8/6 Wound swab >> few GPC  Thank you for allowing pharmacy to be a part of this patient's care.  [employer], Pharm.D., BCPS Clinical Pharmacist Pager: [phone] Clinical phone for 10/25/2016 from 8:30-4:00 is [phone]. After 4pm, please call Main Rx [phone]) for assistance. 10/25/2016 11:33 AM

## 2016-10-25 NOTE — Progress Notes (Signed)
PROGRESS NOTE    Teresa Lee  POE:[id number] DOB: 15-Jan-1961 DOA: 10/21/2016 PCP: System, Pcp Not In   Chief Complaint  Patient presents with  . Post-op Problem    Brief Narrative:  HPI on 10/21/16 by Dr. Toy Baker Teresa Lee is a 56 y.o. female with medical history significant of leukocytosis, myasthenia gravis, hyperlipidemia, hypertension, asthma , fibromyalgia, melanoma  Presented with inflammation of her upper back where she have had melanoma removed last week. She states stiches were place and is first was healing well but then she developed some redness and swelling around the site. Initially she required initial incision 2 weeks ago but then had to go back and clear the borders. She patient had bandage over the area when a family member removed today there was a lot of swelling and redness around the site. She's been feeling lightheaded and unwell but no fevers or chills no nausea vomiting  She's been taking her pain medications and higher amounts than usual to control her discomfort and ran out. Patient has chronic leukocytosis but today her white blood cell count is up from baseline up to 24.  Interim history Admitted for sepsis 2/2 acute cellulitis and placed on IV antibiotics. Dr. Wilhemina Bonito, dermatology, consulted and appreciated, removed suture and able to remove purulent drainage sent for culture.  Assessment & Plan   Sepsis secondary to Acute cellulitis of the back -was tachycardic, had leukocytosis on admission -s/p melanoma excision on 8/2 -Dr. Wilhemina Bonito, dermatology, consulted and appreciated. Removed sutures and able to drain purulent discharge, sent for culture -Fluid culture [facility] -CT chest with contrast: soft tissue edema along the mid and left upper back with mild associated skin thickening, may reflect cellulitis. No abscess or seroma -lactic acid improved from 2.3 to 0.9 -leukocytosis improving -Continue IV vancomycin/cefepime -blood cultures show no  growth to date -Erythema does appear to be improving -continue pain control  Intractable pain/Chronic pain with Fibromyalgia -Continue tylenol PRN -Continue hydrocodone, dilaudid PRN -Will order lidoderm patch, toradol, kpad -Continue citalopram, ropinirole  AKI -Resolved, creatinine currently 0.62 (baseline) -peaked at 1.02 -likely secondary to sepsis -Continue to monitor BMP  Essential Hypertension -not on any home medications -Continue to monitor   Melanoma -Continue outpatient follow up with Dr. Wilhemina Bonito, Dermatology  Myasthenia Gravis -Continue cellcept, prednisone -Patient follows with [facility] -Admitting physician discussed with neurology, continue current treatment, if patient decompensates, will formally consult  Asthma -Currently stable -Continue nebs PRN  GERD -Continue PPI  Hypokalemia/hypomagnesemia/hypophosphatemia/hyponatremia -Continue to replace and monitor   Crohn's disease/IBS -Stable -Continue mesalamine and bowel regimen  DVT Prophylaxis  SCDs  Code Status: Full  Family Communication: Family at bedside  Disposition Plan: Admitted. Possibly discharge to home in 24-48 hours  Consultants Dr. Wilhemina Bonito, Dermatology Neurology, via phone, by admitting physician   Procedures  None  Antibiotics   Anti-infectives    Start     Dose/Rate Route Frequency Ordered Stop   10/25/16 2000  vancomycin (VANCOCIN) IVPB 1000 mg/200 mL premix     1,000 mg 200 mL/hr over 60 Minutes Intravenous Every 12 hours 10/25/16 1137     10/23/16 1000  ceFEPIme (MAXIPIME) 1 g in dextrose 5 % 50 mL IVPB     1 g 100 mL/hr over 30 Minutes Intravenous Every 8 hours 10/23/16 0930     10/22/16 1500  piperacillin-tazobactam (ZOSYN) IVPB 3.375 g  Status:  Discontinued     3.375 g 12.5 mL/hr over 240 Minutes Intravenous Every  8 hours 10/22/16 1451 10/23/16 0830   10/22/16 0800  vancomycin (VANCOCIN) IVPB 750 mg/150 ml premix  Status:  Discontinued     750 mg 150  mL/hr over 60 Minutes Intravenous Every 12 hours 10/22/16 0021 10/25/16 1137   10/21/16 1745  vancomycin (VANCOCIN) IVPB 1000 mg/200 mL premix     1,000 mg 200 mL/hr over 60 Minutes Intravenous  Once 10/21/16 1734 10/21/16 1854      Subjective:   Teresa Lee seen and examined today.  Continues to complain of 10/10 pain, states she cannot move due to pain. Also complains of lower back pain. Feels Toradol helps her more. Currently denies chest pain, shortness of breath, abdominal pain, N/V/D/C, dizziness, or headache.   Objective:   Vitals:   10/25/16 0524 10/25/16 0915 10/25/16 1237 10/25/16 1651  BP: 108/61 119/64 106/68 (!) 169/76  Pulse: 82 91 86 88  Resp: 18 20 (!) 24   Temp: 98 F (36.7 C)  98.7 F (37.1 C)   TempSrc: Oral     SpO2: 96% 94% 96% 100%  Weight:      Height:        Intake/Output Summary (Last 24 hours) at 10/25/16 1813 Last data filed at 10/25/16 1037  Gross per 24 hour  Intake           2432.5 ml  Output                0 ml  Net           2432.5 ml   Filed Weights   10/21/16 1523 10/21/16 1730  Weight: 65.2 kg (143 lb 11.2 oz) 65.2 kg (143 lb 11.8 oz)    Exam  General: Well developed, well nourished, NAD, appears stated age  HEENT: NCAT, mucous membranes moist.   Cardiovascular: S1 S2 auscultated, no rubs, murmurs or gallops. Regular rate and rhythm.  Respiratory: Clear to auscultation bilaterally with equal chest rise  Abdomen: Soft, nontender, nondistended, + bowel sounds  Extremities: warm dry without cyanosis clubbing or edema  Neuro: AAOx3, nonfocal  Skin: area on upper left back with dressing and outlined, erythema improving, TTP  Psych: Anxious    Data Reviewed: I have personally reviewed following labs and imaging studies  CBC:  Recent Labs Lab 10/21/16 1631 10/22/16 0239 10/23/16 0713 10/24/16 0440 10/25/16 0636  WBC 24.6* 22.8* 17.3* 13.3* 15.5*  NEUTROABS 20.8*  --  15.1* 11.5* 12.4*  HGB 11.9* 10.5* 9.8* 9.4* 8.3*   HCT 34.7* 30.9* 29.1* 27.8* 25.3*  MCV 94.3 94.8 94.5 94.2 95.5  PLT 214 204 217 216 182   Basic Metabolic Panel:  Recent Labs Lab 10/21/16 1610 10/21/16 1631 10/22/16 0239 10/23/16 0713 10/24/16 0440 10/25/16 0636  NA  --  136 133* 131* 133* 136  K  --  2.8* 3.2* 3.2* 4.3 4.2  CL  --  99* 101 99* 103 107  CO2  --  24 26 24 23 22   GLUCOSE  --  128* 107* 144* 110* 93  BUN  --  14 6 14 7 6   CREATININE  --  0.79 0.76 1.02* 0.62 0.62  CALCIUM  --  8.7* 7.8* 7.6* 7.3* 7.2*  MG 1.2*  --  1.0* 2.1 1.7 1.6*  PHOS 2.6  --  2.1* 2.0* 1.4* 1.5*   GFR: Estimated Creatinine Clearance: 69.5 mL/min (by C-G formula based on SCr of 0.62 mg/dL). Liver Function Tests:  Recent Labs Lab 10/22/16 0239 10/23/16 9937 10/24/16 0440 10/25/16 0636  AST 12* 19 19 16   ALT 9* 9* 11* 10*  ALKPHOS 49 65 71 70  BILITOT 1.0 1.1 0.9 0.5  PROT 5.8* 5.7* 5.4* 5.2*  ALBUMIN 2.8* 2.4* 2.2* 2.2*   No results for input(s): LIPASE, AMYLASE in the last 168 hours. No results for input(s): AMMONIA in the last 168 hours. Coagulation Profile:  Recent Labs Lab 10/21/16 2348  INR 1.07   Cardiac Enzymes: No results for input(s): CKTOTAL, CKMB, CKMBINDEX, TROPONINI in the last 168 hours. BNP (last 3 results) No results for input(s): PROBNP in the last 8760 hours. HbA1C: No results for input(s): HGBA1C in the last 72 hours. CBG: No results for input(s): GLUCAP in the last 168 hours. Lipid Profile: No results for input(s): CHOL, HDL, LDLCALC, TRIG, CHOLHDL, LDLDIRECT in the last 72 hours. Thyroid Function Tests: No results for input(s): TSH, T4TOTAL, FREET4, T3FREE, THYROIDAB in the last 72 hours. Anemia Panel: No results for input(s): VITAMINB12, FOLATE, FERRITIN, TIBC, IRON, RETICCTPCT in the last 72 hours. Urine analysis: No results found for: COLORURINE, APPEARANCEUR, LABSPEC, PHURINE, GLUCOSEU, HGBUR, BILIRUBINUR, KETONESUR, PROTEINUR, UROBILINOGEN, NITRITE, LEUKOCYTESUR Sepsis  Labs: @LABRCNTIP (procalcitonin:4,lacticidven:4)  ) Recent Results (from the past 240 hour(s))  Blood culture (routine x 2)     Status: None (Preliminary result)   Collection Time: 10/21/16  5:40 PM  Result Value Ref Range Status   Specimen Description BLOOD LEFT HAND  Final   Special Requests   Final    BOTTLES DRAWN AEROBIC AND ANAEROBIC Blood Culture adequate volume   Culture NO GROWTH 4 DAYS  Final   Report Status PENDING  Incomplete  Blood culture (routine x 2)     Status: None (Preliminary result)   Collection Time: 10/21/16  5:45 PM  Result Value Ref Range Status   Specimen Description BLOOD LEFT ANTECUBITAL  Final   Special Requests IN PEDIATRIC BOTTLE Blood Culture adequate volume  Final   Culture NO GROWTH 4 DAYS  Final   Report Status PENDING  Incomplete  MRSA PCR Screening     Status: None   Collection Time: 10/21/16 11:14 PM  Result Value Ref Range Status   MRSA by PCR NEGATIVE NEGATIVE Final    Comment:        The GeneXpert MRSA Assay (FDA approved for NASAL specimens only), is one component of a comprehensive MRSA colonization surveillance program. It is not intended to diagnose MRSA infection nor to guide or monitor treatment for MRSA infections.   Aerobic Culture (superficial specimen)     Status: None (Preliminary result)   Collection Time: 10/23/16  8:25 PM  Result Value Ref Range Status   Specimen Description WOUND  Final   Special Requests BACK SWAB  Final   Gram Stain RARE WBC SEEN FEW GRAM POSITIVE COCCI   Final   Culture   Final    ABUNDANT STAPHYLOCOCCUS AUREUS SUSCEPTIBILITIES TO FOLLOW    Report Status PENDING  Incomplete      Radiology Studies: No results found.   Scheduled Meds: . citalopram  40 mg Oral Daily  . desmopressin  0.3 mg Oral Daily  . estradiol  2 mg Oral BID  . famotidine  20 mg Oral QHS  . feeding supplement  1 Container Oral TID BM  . lidocaine  1 patch Transdermal Q24H  . LORazepam  0.5 mg Intravenous Once  .  Mesalamine  800 mg Oral BID  . multivitamin with minerals  1 tablet Oral Daily  . mycophenolate  500 mg Oral BID  .  neomycin-bacitracin-polymyxin   Topical Daily  . pantoprazole  40 mg Oral Daily  . potassium chloride  40 mEq Oral BID  . predniSONE  7.5 mg Oral Q breakfast  . progesterone  100 mg Oral QHS  . rOPINIRole  1-2 mg Oral QHS  . senna  1 tablet Oral BID   Continuous Infusions: . sodium chloride 75 mL/hr at 10/25/16 0925  . ceFEPime (MAXIPIME) IV Stopped (10/25/16 1248)  . vancomycin       LOS: 4 days   Time Spent in minutes   30 minutes  Izeah Vossler D.O. on 10/25/2016 at 6:13 PM  Between 7am to 7pm - Pager - [phone]  After 7pm go to [email or web address] - password [email or web address]  And look for the night coverage person covering for me after hours  [facility] Office  [phone]

## 2016-10-26 ENCOUNTER — Inpatient Hospital Stay (HOSPITAL_COMMUNITY): Payer: Medicare HMO

## 2016-10-26 DIAGNOSIS — R0602 Shortness of breath: Secondary | ICD-10-CM

## 2016-10-26 LAB — GLUCOSE, CAPILLARY
GLUCOSE-CAPILLARY: 97 mg/dL (ref 65–99)
GLUCOSE-CAPILLARY: 119 mg/dL — AB (ref 65–99)
GLUCOSE-CAPILLARY: 127 mg/dL — AB (ref 65–99)
Glucose-Capillary: 166 mg/dL — ABNORMAL HIGH (ref 65–99)

## 2016-10-26 LAB — MAGNESIUM: Magnesium: 2.2 mg/dL (ref 1.7–2.4)

## 2016-10-26 LAB — BASIC METABOLIC PANEL
Anion gap: 12 (ref 5–15)
BUN: 7 mg/dL (ref 6–20)
CALCIUM: 7.4 mg/dL — AB (ref 8.9–10.3)
CO2: 19 mmol/L — AB (ref 22–32)
Chloride: 106 mmol/L (ref 101–111)
Creatinine, Ser: 0.61 mg/dL (ref 0.44–1.00)
GFR calc Af Amer: >60 mL/min (ref >60)
Glucose, Bld: 86 mg/dL (ref 65–99)
Potassium: 3.9 mmol/L (ref 3.5–5.1)
SODIUM: 137 mmol/L (ref 135–145)

## 2016-10-26 LAB — CBC
HCT: 27.1 % — ABNORMAL LOW (ref 36.0–46.0)
HEMOGLOBIN: 9 g/dL — AB (ref 12.0–15.0)
MCH: 32.3 pg (ref 26.0–34.0)
MCHC: 33.2 g/dL (ref 30.0–36.0)
MCV: 97.1 fL (ref 78.0–100.0)
Platelets: 251 10*3/uL (ref 150–400)
RBC: 2.79 MIL/uL — ABNORMAL LOW (ref 3.87–5.11)
RDW: 14.6 % (ref 11.5–15.5)
WBC: 17.2 10*3/uL — ABNORMAL HIGH (ref 4.0–10.5)

## 2016-10-26 LAB — CULTURE, BLOOD (ROUTINE X 2)
CULTURE: NO GROWTH
Culture: NO GROWTH
SPECIAL REQUESTS: ADEQUATE
Special Requests: ADEQUATE

## 2016-10-26 LAB — PHOSPHORUS: PHOSPHORUS: 1.5 mg/dL — AB (ref 2.5–4.6)

## 2016-10-26 LAB — BRAIN NATRIURETIC PEPTIDE: B Natriuretic Peptide: 323.3 pg/mL — ABNORMAL HIGH (ref 0.0–100.0)

## 2016-10-26 MED ORDER — FUROSEMIDE 10 MG/ML IJ SOLN
20.0000 mg | Freq: Once | INTRAMUSCULAR | Status: AC
  Administered 2016-10-26: 20 mg via INTRAVENOUS
  Filled 2016-10-26: qty 2

## 2016-10-26 MED ORDER — BOOST PLUS PO LIQD
237.0000 mL | Freq: Three times a day (TID) | ORAL | Status: DC
  Administered 2016-10-26 – 2016-10-27 (×3): 237 mL via ORAL
  Filled 2016-10-26 (×13): qty 237

## 2016-10-26 MED ORDER — KETOROLAC TROMETHAMINE 15 MG/ML IJ SOLN: 15.0000 mg | Freq: Two times a day (BID) | INTRAMUSCULAR | Status: DC | PRN

## 2016-10-26 MED ORDER — K PHOS MONO-SOD PHOS DI & MONO 155-852-130 MG PO TABS
250.0000 mg | ORAL_TABLET | Freq: Three times a day (TID) | ORAL | Status: DC
  Administered 2016-10-26 – 2016-10-28 (×9): 250 mg via ORAL
  Filled 2016-10-26 (×13): qty 1

## 2016-10-26 NOTE — Progress Notes (Signed)
Nutrition Follow-up  DOCUMENTATION CODES:   Not applicable  INTERVENTION:   -D/c Boost Breeze po TID, each supplement provides 250 kcal and 9 grams of protein -Boost Plus TID -MVI daily  NUTRITION DIAGNOSIS:   Inadequate oral intake related to poor appetite (pain) as evidenced by meal completion < 50%.  Progressing   GOAL:   Patient will meet greater than or equal to 90% of their needs  Progressing  MONITOR:   PO intake, Supplement acceptance, Labs, Weight trends, Skin, I & O's  REASON FOR ASSESSMENT:   Consult Poor PO  ASSESSMENT:   56 y.o. female with medical history significant of leukocytosis, myasthenia gravis, hyperlipidemia, hypertension, asthma , fibromyalgia, melanoma  Admitted for Wound infection after melanoma resection  Pt sleeping soundly at time of visit.   Case discussed with RN. Pt remains with poor oral intake (PO: 0-10%). Per RN, pt dislikes Boost Breeze supplement, however, pt consumed all of chocolate Boost supplement this AM. RD will modify order.   Labs reviewed: Phos: 1.5, CBGS: 97-166.   Diet Order:  Diet regular Room service appropriate? Yes; Fluid consistency: Thin  Skin:   (lt back incision)  Last BM:  10/26/16  Height:   Ht Readings from Last 1 Encounters:  10/21/16 5\' 1"  (1.549 m)    Weight:   Wt Readings from Last 1 Encounters:  10/26/16 170 lb 9.6 oz (77.4 kg)    Ideal Body Weight:  47.7 kg  BMI:  Body mass index is 32.23 kg/m.  Estimated Nutritional Needs:   Kcal:  1650-1850  Protein:  85-100 grams  Fluid:  > 1.6 L  EDUCATION NEEDS:   Education needs addressed  Avian Greenawalt A. Jimmye Norman, RD, LDN, CDE Pager: [phone] After hours Pager: [phone]

## 2016-10-26 NOTE — Progress Notes (Addendum)
PROGRESS NOTE    Teresa Lee  ZDG:[id number] DOB: 15-Sep-1960 DOA: 10/21/2016 PCP: System, Pcp Not In   Chief Complaint  Patient presents with  . Post-op Problem    Brief Narrative:  HPI on 10/21/16 by Dr. Toy Baker Teresa Lee is a 56 y.o. female with medical history significant of leukocytosis, myasthenia gravis, hyperlipidemia, hypertension, asthma , fibromyalgia, melanoma  Presented with inflammation of her upper back where she have had melanoma removed last week. She states stiches were place and is first was healing well but then she developed some redness and swelling around the site. Initially she required initial incision 2 weeks ago but then had to go back and clear the borders. She patient had bandage over the area when a family member removed today there was a lot of swelling and redness around the site. She's been feeling lightheaded and unwell but no fevers or chills no nausea vomiting  She's been taking her pain medications and higher amounts than usual to control her discomfort and ran out. Patient has chronic leukocytosis but today her white blood cell count is up from baseline up to 24.  Interim history Admitted for sepsis 2/2 acute cellulitis and placed on IV antibiotics. Dr. Wilhemina Bonito, dermatology, consulted and appreciated, removed suture and able to remove purulent drainage sent for culture. Patient feeling short of breath today, will obtain CXR, BNP, echo, and discontinue IVF.  Assessment & Plan   Sepsis secondary to Acute cellulitis of the back -was tachycardic, had leukocytosis on admission -s/p melanoma excision on 8/2 -Dr. Wilhemina Bonito, dermatology, consulted and appreciated. Removed sutures and able to drain purulent discharge, sent for culture -Fluid culture [facility] -CT chest with contrast: soft tissue edema along the mid and left upper back with mild associated skin thickening, may reflect cellulitis. No abscess or seroma -lactic acid improved from 2.3 to  0.9 -Continue IV vancomycin/cefepime -blood cultures show no growth to date -continue pain control -noted to have some drainage on dressing. Have placed call to Dr. Ronnald Ramp.   Dyspnea, ?Acute CHF -patient with no history of CHF, however, did however receive over 6L during this admission as patient had sepsis and AKI -IVF discontinued on 8/8 -patient feeling short of breath today, lungs sound clear posteriorly, however upper airway wheezing noted anteriorly -last echo in 2015 showed normal EF -Obtained CXR: small pleural effusion, no infiltrate -Will obtain repeat echo, BNP -Given IV lasix 60m once and monitor strict intake/output, daily weights  Intractable pain/Chronic pain with Fibromyalgia -Continue tylenol PRN -Continue hydrocodone, dilaudid PRN -Will order lidoderm patch, toradol, kpad -Continue citalopram, ropinirole  AKI -Resolved, creatinine currently 0.61 (baseline) -peaked at 1.02 -likely secondary to sepsis -Continue to monitor BMP  Essential Hypertension -not on any home medications -Continue to monitor   Melanoma -Continue outpatient follow up with Dr. DWilhemina Bonito Dermatology  Myasthenia Gravis -Continue cellcept, prednisone -Patient follows with [facility] -Admitting physician discussed with neurology, continue current treatment, if patient decompensates, will formally consult  Asthma -Currently stable -Continue nebs PRN  GERD -Continue PPI  Hypophosphatemia -Continue to replace, will give K-phos tablets -Continue to monitor   Hyponatremia -resolved  Hypokalemia -resolved  Hypomagnesemia -resolved   Crohn's disease/IBS -Stable -Continue mesalamine and bowel regimen  DVT Prophylaxis  SCDs  Code Status: Full  Family Communication: Family at bedside  Disposition Plan: Admitted. Possibly discharge to home in 24-48 hours  Consultants Dr. DWilhemina Bonito Dermatology Neurology, via phone, by admitting physician   Procedures   None  Antibiotics   Anti-infectives    Start     Dose/Rate Route Frequency Ordered Stop   10/25/16 2000  vancomycin (VANCOCIN) IVPB 1000 mg/200 mL premix     1,000 mg 200 mL/hr over 60 Minutes Intravenous Every 12 hours 10/25/16 1137     10/23/16 1000  ceFEPIme (MAXIPIME) 1 g in dextrose 5 % 50 mL IVPB     1 g 100 mL/hr over 30 Minutes Intravenous Every 8 hours 10/23/16 0930     10/22/16 1500  piperacillin-tazobactam (ZOSYN) IVPB 3.375 g  Status:  Discontinued     3.375 g 12.5 mL/hr over 240 Minutes Intravenous Every 8 hours 10/22/16 1451 10/23/16 0830   10/22/16 0800  vancomycin (VANCOCIN) IVPB 750 mg/150 ml premix  Status:  Discontinued     750 mg 150 mL/hr over 60 Minutes Intravenous Every 12 hours 10/22/16 0021 10/25/16 1137   10/21/16 1745  vancomycin (VANCOCIN) IVPB 1000 mg/200 mL premix     1,000 mg 200 mL/hr over 60 Minutes Intravenous  Once 10/21/16 1734 10/21/16 1854      Subjective:   Teresa Lee seen and examined today.  Feeling short of breath. Does not know if she has been coughing. Denies chest pain. Feels some upper abdominal pain. Had some nausea after receiving potassium, but denies vomiting. Denies dizziness or headache.   Objective:   Vitals:   10/25/16 1651 10/25/16 1732 10/26/16 0117 10/26/16 0443  BP: (!) 169/76 (!) 176/83 (!) 152/71 (!) 153/82  Pulse: 88 94 83 86  Resp:  (!) 22 18 20   Temp:   98.7 F (37.1 C) 98.2 F (36.8 C)  TempSrc:   Oral Oral  SpO2: 100% 100% 94% 94%  Weight:      Height:        Intake/Output Summary (Last 24 hours) at 10/26/16 1119 Last data filed at 10/26/16 0814  Gross per 24 hour  Intake              260 ml  Output                0 ml  Net              260 ml   Filed Weights   10/21/16 1523 10/21/16 1730  Weight: 65.2 kg (143 lb 11.2 oz) 65.2 kg (143 lb 11.8 oz)   Exam  General: Well developed, ill-appearing, mild distress  HEENT: NCAT, mucous membranes moist.   Cardiovascular: S1 S2 auscultated, RRR, no  murmurs  Respiratory: CTA posteriorly. Mild exp wheezing (upper airway) anteriorly  Abdomen: Soft, nontender, nondistended, + bowel sounds  Extremities: warm dry without cyanosis clubbing. Trace LE edema.   Neuro: AAOx3, nonfocal  Skin: Dressing on left upper back, removed- noted small amount of purulent drainage, erythema improved, mildly TTP  Psych: Appropriate mood and affect  Data Reviewed: I have personally reviewed following labs and imaging studies  CBC:  Recent Labs Lab 10/21/16 1631 10/22/16 0239 10/23/16 0713 10/24/16 0440 10/25/16 0636 10/26/16 0428  WBC 24.6* 22.8* 17.3* 13.3* 15.5* 17.2*  NEUTROABS 20.8*  --  15.1* 11.5* 12.4*  --   HGB 11.9* 10.5* 9.8* 9.4* 8.3* 9.0*  HCT 34.7* 30.9* 29.1* 27.8* 25.3* 27.1*  MCV 94.3 94.8 94.5 94.2 95.5 97.1  PLT 214 204 217 216 231 213   Basic Metabolic Panel:  Recent Labs Lab 10/22/16 0239 10/23/16 0713 10/24/16 0440 10/25/16 0636 10/26/16 0428  NA 133* 131* 133* 136 137  K 3.2* 3.2* 4.3 4.2  3.9  CL 101 99* 103 107 106  CO2 26 24 23 22  19*  GLUCOSE 107* 144* 110* 93 86  BUN 6 14 7 6 7   CREATININE 0.76 1.02* 0.62 0.62 0.61  CALCIUM 7.8* 7.6* 7.3* 7.2* 7.4*  MG 1.0* 2.1 1.7 1.6* 2.2  PHOS 2.1* 2.0* 1.4* 1.5* 1.5*   GFR: Estimated Creatinine Clearance: 69.5 mL/min (by C-G formula based on SCr of 0.61 mg/dL). Liver Function Tests:  Recent Labs Lab 10/22/16 0239 10/23/16 0713 10/24/16 0440 10/25/16 0636  AST 12* 19 19 16   ALT 9* 9* 11* 10*  ALKPHOS 49 65 71 70  BILITOT 1.0 1.1 0.9 0.5  PROT 5.8* 5.7* 5.4* 5.2*  ALBUMIN 2.8* 2.4* 2.2* 2.2*   No results for input(s): LIPASE, AMYLASE in the last 168 hours. No results for input(s): AMMONIA in the last 168 hours. Coagulation Profile:  Recent Labs Lab 10/21/16 2348  INR 1.07   Cardiac Enzymes: No results for input(s): CKTOTAL, CKMB, CKMBINDEX, TROPONINI in the last 168 hours. BNP (last 3 results) No results for input(s): PROBNP in the last 8760  hours. HbA1C: No results for input(s): HGBA1C in the last 72 hours. CBG:  Recent Labs Lab 10/26/16 0735  GLUCAP 97   Lipid Profile: No results for input(s): CHOL, HDL, LDLCALC, TRIG, CHOLHDL, LDLDIRECT in the last 72 hours. Thyroid Function Tests: No results for input(s): TSH, T4TOTAL, FREET4, T3FREE, THYROIDAB in the last 72 hours. Anemia Panel: No results for input(s): VITAMINB12, FOLATE, FERRITIN, TIBC, IRON, RETICCTPCT in the last 72 hours. Urine analysis: No results found for: COLORURINE, APPEARANCEUR, LABSPEC, PHURINE, GLUCOSEU, HGBUR, BILIRUBINUR, KETONESUR, PROTEINUR, UROBILINOGEN, NITRITE, LEUKOCYTESUR Sepsis Labs: @LABRCNTIP (procalcitonin:4,lacticidven:4)  ) Recent Results (from the past 240 hour(s))  Blood culture (routine x 2)     Status: None (Preliminary result)   Collection Time: 10/21/16  5:40 PM  Result Value Ref Range Status   Specimen Description BLOOD LEFT HAND  Final   Special Requests   Final    BOTTLES DRAWN AEROBIC AND ANAEROBIC Blood Culture adequate volume   Culture NO GROWTH 4 DAYS  Final   Report Status PENDING  Incomplete  Blood culture (routine x 2)     Status: None (Preliminary result)   Collection Time: 10/21/16  5:45 PM  Result Value Ref Range Status   Specimen Description BLOOD LEFT ANTECUBITAL  Final   Special Requests IN PEDIATRIC BOTTLE Blood Culture adequate volume  Final   Culture NO GROWTH 4 DAYS  Final   Report Status PENDING  Incomplete  MRSA PCR Screening     Status: None   Collection Time: 10/21/16 11:14 PM  Result Value Ref Range Status   MRSA by PCR NEGATIVE NEGATIVE Final    Comment:        The GeneXpert MRSA Assay (FDA approved for NASAL specimens only), is one component of a comprehensive MRSA colonization surveillance program. It is not intended to diagnose MRSA infection nor to guide or monitor treatment for MRSA infections.   Aerobic Culture (superficial specimen)     Status: None (Preliminary result)    Collection Time: 10/23/16  8:25 PM  Result Value Ref Range Status   Specimen Description WOUND  Final   Special Requests BACK SWAB  Final   Gram Stain RARE WBC SEEN FEW GRAM POSITIVE COCCI   Final   Culture   Final    ABUNDANT STAPHYLOCOCCUS AUREUS SUSCEPTIBILITIES TO FOLLOW    Report Status PENDING  Incomplete      Radiology Studies:  Dg Chest 2 View  Result Date: 10/26/2016 CLINICAL DATA:  Shortness of Breath EXAM: CHEST  2 VIEW COMPARISON:  October 26, 2016 FINDINGS: There are small pleural effusions bilaterally. There is mild bibasilar atelectasis. There is no edema or consolidation. Heart is mildly enlarged with pulmonary vascularity within normal limits. Patient is status post median sternotomy. No evident adenopathy. No bone lesions. IMPRESSION: Small pleural effusions bilaterally with bibasilar atelectasis. No frank edema or consolidation. Heart prominent with pulmonary vascularity within normal limits. Electronically Signed   By: Lowella Grip III M.D.   On: 10/26/2016 11:12   Dg Chest Port 1 View  Result Date: 10/26/2016 CLINICAL DATA:  Shortness of Breath EXAM: PORTABLE CHEST 1 VIEW COMPARISON:  October 22, 2016 FINDINGS: There is a small right pleural effusion. There is mild interstitial edema. No airspace consolidation. Heart is slightly enlarged with mild pulmonary venous hypertension. No adenopathy. Patient is status post median sternotomy. The inferior-most sternal wire has a fracture along its rightward superior aspect. No bone lesions. No pneumothorax. IMPRESSION: Evidence a degree of congestive heart failure as described. No consolidation. Status post median sternotomy. Electronically Signed   By: Lowella Grip III M.D.   On: 10/26/2016 08:14     Scheduled Meds: . citalopram  40 mg Oral Daily  . desmopressin  0.3 mg Oral Daily  . estradiol  2 mg Oral BID  . famotidine  20 mg Oral QHS  . feeding supplement  1 Container Oral TID BM  . furosemide  20 mg Intravenous  Once  . lidocaine  1 patch Transdermal Q24H  . Mesalamine  800 mg Oral BID  . multivitamin with minerals  1 tablet Oral Daily  . mycophenolate  500 mg Oral BID  . neomycin-bacitracin-polymyxin   Topical Daily  . pantoprazole  40 mg Oral Daily  . phosphorus  250 mg Oral TID WC & HS  . predniSONE  7.5 mg Oral Q breakfast  . progesterone  100 mg Oral QHS  . rOPINIRole  1-2 mg Oral QHS  . senna  1 tablet Oral BID   Continuous Infusions: . ceFEPime (MAXIPIME) IV Stopped (10/26/16 0447)  . vancomycin Stopped (10/26/16 0909)     LOS: 5 days   Time Spent in minutes   45 minutes  Arkie Tagliaferro D.O. on 10/26/2016 at 11:19 AM  Between 7am to 7pm - Pager - [phone]  After 7pm go to [email or web address] - password [email or web address]  And look for the night coverage person covering for me after hours  [facility] Office  [phone]

## 2016-10-26 NOTE — Evaluation (Signed)
Physical Therapy Evaluation Patient Details Name: Teresa Lee MRN: [id number] DOB: [birth year] Today's Date: 10/26/2016   History of Present Illness  56 y.o.femalewith medical history significant of leukocytosis, myasthenia gravis, hyperlipidemia, hypertension, asthma , fibromyalgia, melanoma. Presented for inflammation of her upper back where she had melanoma removed last week, Admitted for sepsis 2/2 acute cellulitis and placed on IV antibiotics.   Clinical Impression  Orders received for PT evaluation. Patient demonstrates modest deficits in functional mobility due to fatigue but is overall mobilizing well. Patient states this is her baseline level of mobility and that she does not feel she will have any difficulty managing upon return home. Encouraged patient to continue to mobilize with staff over hospital course. No further acute PT needs. Will sign off.     Follow Up Recommendations Supervision - Intermittent;No PT follow up    Equipment Recommendations  None recommended by PT    Recommendations for Other Services       Precautions / Restrictions        Mobility  Bed Mobility Overal bed mobility: Modified Independent             General bed mobility comments: increased time, no physical assist  Transfers Overall transfer level: Modified independent Equipment used: None             General transfer comment: increased time, no physical assist  Ambulation/Gait Ambulation/Gait assistance: Supervision Ambulation Distance (Feet): 60 Feet Assistive device: None       General Gait Details: in room ambulation secnodary to lasix dosing and fatigue. patient with good stability, no overt LOB  Stairs            Wheelchair Mobility    Modified Rankin (Stroke Patients Only)       Balance Overall balance assessment: No apparent balance deficits (not formally assessed)                                           Pertinent  Vitals/Pain Pain Assessment: No/denies pain    Home Living Family/patient expects to be discharged to:: Private residence Living Arrangements: Spouse/significant other Available Help at Discharge: Family Type of Home: House Home Access: Stairs to enter Entrance Stairs-Rails: Can reach both Entrance Stairs-Number of Steps: 3 Home Layout: Two level;Able to live on main level with bedroom/bathroom Home Equipment: None      Prior Function Level of Independence: Independent               Hand Dominance   Dominant Hand: Right    Extremity/Trunk Assessment   Upper Extremity Assessment Upper Extremity Assessment: Overall WFL for tasks assessed    Lower Extremity Assessment Lower Extremity Assessment: Overall WFL for tasks assessed       Communication   Communication: No difficulties  Cognition Arousal/Alertness: Awake/alert Behavior During Therapy: WFL for tasks assessed/performed Overall Cognitive Status: Within Functional Limits for tasks assessed                                        General Comments      Exercises     Assessment/Plan    PT Assessment Patent does not need any further PT services  PT Problem List         PT Treatment Interventions  PT Goals (Current goals can be found in the Care Plan section)  Acute Rehab PT Goals PT Goal Formulation: All assessment and education complete, DC therapy    Frequency     Barriers to discharge        Co-evaluation               AM-PAC PT "6 Clicks" Daily Activity  Outcome Measure Difficulty turning over in bed (including adjusting bedclothes, sheets and blankets)?: None Difficulty moving from lying on back to sitting on the side of the bed? : None Difficulty sitting down on and standing up from a chair with arms (e.g., wheelchair, bedside commode, etc,.)?: None Help needed moving to and from a bed to chair (including a wheelchair)?: None Help needed walking in hospital  room?: None Help needed climbing 3-5 steps with a railing? : A Little 6 Click Score: 23    End of Session Equipment Utilized During Treatment: Oxygen Activity Tolerance: Patient limited by fatigue Patient left: in bed;with call bell/phone within reach Nurse Communication: Mobility status PT Visit Diagnosis: Difficulty in walking, not elsewhere classified (R26.2)    Time: 5003-7048 PT Time Calculation (min) (ACUTE ONLY): 17 min   Charges:   PT Evaluation $PT Eval Moderate Complexity: 1 Mod     PT G Codes:        Alben Deeds, PT DPT  Board Certified Neurologic Specialist [phone]   Duncan Dull 10/26/2016, 5:44 PM

## 2016-10-26 NOTE — Progress Notes (Signed)
Patient complained of difficulty breathing.  Assessed the lung.  Lung sounds clear but diminished in lower bases.  Vital signs: Temp 98.2. BP: 153/82. Pulse rate 86. Respirations 20.  94% on room air.  Respiratory team came earlier to see patient due to complaint. No actions taken by respiratory except for the assessment.  Patient during the day was given a one time dose of Ativan due to the same complaint.  Pt stated it didn't help her breathing and made her hallucinate.  Notified NP K. Schorr on call.  Will continue to monitor the patient and notify as needed.Teresa Lee

## 2016-10-27 ENCOUNTER — Inpatient Hospital Stay (HOSPITAL_COMMUNITY): Payer: Medicare HMO

## 2016-10-27 DIAGNOSIS — I340 Nonrheumatic mitral (valve) insufficiency: Secondary | ICD-10-CM

## 2016-10-27 DIAGNOSIS — I361 Nonrheumatic tricuspid (valve) insufficiency: Secondary | ICD-10-CM

## 2016-10-27 DIAGNOSIS — D72825 Bandemia: Secondary | ICD-10-CM

## 2016-10-27 LAB — AEROBIC CULTURE  (SUPERFICIAL SPECIMEN)

## 2016-10-27 LAB — BASIC METABOLIC PANEL
Anion gap: 10 (ref 5–15)
BUN: 8 mg/dL (ref 6–20)
CHLORIDE: 104 mmol/L (ref 101–111)
CO2: 25 mmol/L (ref 22–32)
CREATININE: 0.61 mg/dL (ref 0.44–1.00)
Calcium: 7.9 mg/dL — ABNORMAL LOW (ref 8.9–10.3)
GFR calc non Af Amer: >60 mL/min (ref >60)
Glucose, Bld: 80 mg/dL (ref 65–99)
POTASSIUM: 3.7 mmol/L (ref 3.5–5.1)
SODIUM: 139 mmol/L (ref 135–145)

## 2016-10-27 LAB — CBC
HCT: 27.1 % — ABNORMAL LOW (ref 36.0–46.0)
HEMOGLOBIN: 9 g/dL — AB (ref 12.0–15.0)
MCH: 31.9 pg (ref 26.0–34.0)
MCHC: 33.2 g/dL (ref 30.0–36.0)
MCV: 96.1 fL (ref 78.0–100.0)
Platelets: 260 10*3/uL (ref 150–400)
RBC: 2.82 MIL/uL — AB (ref 3.87–5.11)
RDW: 14.4 % (ref 11.5–15.5)
WBC: 15.6 10*3/uL — ABNORMAL HIGH (ref 4.0–10.5)

## 2016-10-27 LAB — MAGNESIUM: MAGNESIUM: 1.9 mg/dL (ref 1.7–2.4)

## 2016-10-27 LAB — AEROBIC CULTURE W GRAM STAIN (SUPERFICIAL SPECIMEN)

## 2016-10-27 LAB — ECHOCARDIOGRAM COMPLETE
Height: 61 in
Weight: 2694.4 oz

## 2016-10-27 LAB — PHOSPHORUS: PHOSPHORUS: 3.7 mg/dL (ref 2.5–4.6)

## 2016-10-27 LAB — GLUCOSE, CAPILLARY: Glucose-Capillary: 97 mg/dL (ref 65–99)

## 2016-10-27 MED ORDER — CEPHALEXIN 500 MG PO CAPS
500.0000 mg | ORAL_CAPSULE | Freq: Four times a day (QID) | ORAL | Status: DC
  Administered 2016-10-27 – 2016-10-28 (×5): 500 mg via ORAL
  Filled 2016-10-27 (×5): qty 1

## 2016-10-27 MED ORDER — FUROSEMIDE 10 MG/ML IJ SOLN
20.0000 mg | Freq: Once | INTRAMUSCULAR | Status: AC
  Administered 2016-10-27: 20 mg via INTRAVENOUS
  Filled 2016-10-27: qty 2

## 2016-10-27 NOTE — Progress Notes (Signed)
  Echocardiogram 2D Echocardiogram has been performed.  Teresa Lee T Caleb Prigmore 10/27/2016, 9:53 AM

## 2016-10-27 NOTE — Progress Notes (Signed)
PROGRESS NOTE    Teresa Lee  LFY:[id number] DOB: 1960/09/21 DOA: 10/21/2016 PCP: System, Pcp Not In   Chief Complaint  Patient presents with  . Post-op Problem    Brief Narrative:  HPI on 10/21/16 by Dr. Toy Baker Teresa Lee is a 56 y.o. female with medical history significant of leukocytosis, myasthenia gravis, hyperlipidemia, hypertension, asthma , fibromyalgia, melanoma  Presented with inflammation of her upper back where she have had melanoma removed last week. She states stiches were place and is first was healing well but then she developed some redness and swelling around the site. Initially she required initial incision 2 weeks ago but then had to go back and clear the borders. She patient had bandage over the area when a family member removed today there was a lot of swelling and redness around the site. She's been feeling lightheaded and unwell but no fevers or chills no nausea vomiting  She's been taking her pain medications and higher amounts than usual to control her discomfort and ran out. Patient has chronic leukocytosis but today her white blood cell count is up from baseline up to 24.  Interim history Admitted for sepsis 2/2 acute cellulitis and placed on IV antibiotics. Dr. Wilhemina Bonito, dermatology, consulted and appreciated, removed suture and able to remove purulent drainage sent for culture. Now with SOB, likely CHF related. Improved with lasix.   Assessment & Plan   Sepsis secondary to Acute cellulitis of the back -was tachycardic, had leukocytosis on admission -s/p melanoma excision on 8/2 -Dr. Wilhemina Bonito, dermatology, consulted and appreciated. Removed sutures and able to drain purulent discharge, sent for culture -Fluid culture [facility] -CT chest with contrast: soft tissue edema along the mid and left upper back with mild associated skin thickening, may reflect cellulitis. No abscess or seroma -lactic acid improved from 2.3 to 0.9 -was placed on  vancomycin/cefepime, discontinued and transitioned to keflex  -blood cultures show no growth to date -continue pain control -Discussed with Dr. Ronnald Ramp, patient will need to follow up with him or partner next week.   Dyspnea, secondary to hypervolemia -patient with no history of CHF, however, did however receive over 6L during this admission as patient had sepsis and AKI -IVF discontinued on 8/8 -patient feeling short of breath -BNP 323 -CXR: All pleural effusion, no infiltrate -last echo in 2015 showed normal EF -Echocardiogram obtained today showing EF of 65-70%, no regional wall motion abnormalities -Was given 20 of Lasix IV 1 time, breathing improved -Will give additional dose of IV Lasix today -Urine output the past 24 hours 2375cc -Continue to monitor intake and output, daily weights  Intractable pain/Chronic pain with Fibromyalgia -Continue tylenol PRN -Continue hydrocodone, dilaudid PRN -Will order lidoderm patch, toradol, kpad -Continue citalopram, ropinirole  AKI -Resolved, creatinine currently 0.61 (baseline) -peaked at 1.02 -likely secondary to sepsis -Continue to monitor BMP  Essential Hypertension -not on any home medications -Continue to monitor   Melanoma -Continue outpatient follow up with Dr. Wilhemina Bonito, Dermatology  Myasthenia Gravis -Continue cellcept, prednisone -Patient follows with [facility] -Admitting physician discussed with neurology, continue current treatment, if patient decompensates, will formally consult  Asthma -Currently stable -Continue nebs PRN  GERD -Continue PPI  Hypophosphatemia -Resolved, phosphorus 3.7 -Continue to monitor   Hyponatremia -resolved  Hypokalemia -resolved  Hypomagnesemia -resolved   Crohn's disease/IBS -Stable -Continue mesalamine and bowel regimen  DVT Prophylaxis  SCDs  Code Status: Full  Family Communication: None at bedside  Disposition Plan: Admitted. Possibly discharge to  home in  24-48 hours  Consultants Dr. Wilhemina Bonito, Dermatology Neurology, via phone, by admitting physician   Procedures  Echocardiogram  Antibiotics   Anti-infectives    Start     Dose/Rate Route Frequency Ordered Stop   10/27/16 1200  cephALEXin (KEFLEX) capsule 500 mg     500 mg Oral Every 6 hours 10/27/16 0953     10/25/16 2000  vancomycin (VANCOCIN) IVPB 1000 mg/200 mL premix  Status:  Discontinued     1,000 mg 200 mL/hr over 60 Minutes Intravenous Every 12 hours 10/25/16 1137 10/27/16 0953   10/23/16 1000  ceFEPIme (MAXIPIME) 1 g in dextrose 5 % 50 mL IVPB  Status:  Discontinued     1 g 100 mL/hr over 30 Minutes Intravenous Every 8 hours 10/23/16 0930 10/26/16 1255   10/22/16 1500  piperacillin-tazobactam (ZOSYN) IVPB 3.375 g  Status:  Discontinued     3.375 g 12.5 mL/hr over 240 Minutes Intravenous Every 8 hours 10/22/16 1451 10/23/16 0830   10/22/16 0800  vancomycin (VANCOCIN) IVPB 750 mg/150 ml premix  Status:  Discontinued     750 mg 150 mL/hr over 60 Minutes Intravenous Every 12 hours 10/22/16 0021 10/25/16 1137   10/21/16 1745  vancomycin (VANCOCIN) IVPB 1000 mg/200 mL premix     1,000 mg 200 mL/hr over 60 Minutes Intravenous  Once 10/21/16 1734 10/21/16 1854      Subjective:   Teresa Lee seen and examined today.  Patient states her breathing has improved. She currently denies any chest pain, abdominal pain, nausea vomiting, diarrhea or constipation. Feels her back pain has also improved mildly.   Objective:   Vitals:   10/26/16 1507 10/26/16 2124 10/27/16 0552 10/27/16 1345  BP: 123/66 122/68 112/73 (!) 148/101  Pulse: 74 73 69 78  Resp: 18 18 17 18   Temp: 97.6 F (36.4 C) 98.4 F (36.9 C) 98.5 F (36.9 C) (!) 97.5 F (36.4 C)  TempSrc: Oral Oral Oral Oral  SpO2: 97% 91% 94% 96%  Weight:   76.4 kg (168 lb 6.4 oz)   Height:        Intake/Output Summary (Last 24 hours) at 10/27/16 1607 Last data filed at 10/27/16 1545  Gross per 24 hour  Intake               940 ml  Output             1450 ml  Net             -510 ml   Filed Weights   10/21/16 1730 10/26/16 1142 10/27/16 0552  Weight: 65.2 kg (143 lb 11.8 oz) 77.4 kg (170 lb 9.6 oz) 76.4 kg (168 lb 6.4 oz)   Exam  General: Well developed, ill-appearing, NAD  HEENT: NCAT, mucous membranes moist.   Cardiovascular: S1 S2 auscultated,no murmurs, RRR  Respiratory: Clear to auscultation bilaterally with equal chest rise  Abdomen: Soft, nontender, nondistended, + bowel sounds  Extremities: warm dry without cyanosis clubbing. Trace edema, improved.  Neuro: AAOx3, nonfocal  Psych: Appropriate mood and affect  Data Reviewed: I have personally reviewed following labs and imaging studies  CBC:  Recent Labs Lab 10/21/16 1631  10/23/16 0713 10/24/16 0440 10/25/16 0636 10/26/16 0428 10/27/16 0340  WBC 24.6*  < > 17.3* 13.3* 15.5* 17.2* 15.6*  NEUTROABS 20.8*  --  15.1* 11.5* 12.4*  --   --   HGB 11.9*  < > 9.8* 9.4* 8.3* 9.0* 9.0*  HCT 34.7*  < >  29.1* 27.8* 25.3* 27.1* 27.1*  MCV 94.3  < > 94.5 94.2 95.5 97.1 96.1  PLT 214  < > 217 216 231 251 260  < > = values in this interval not displayed. Basic Metabolic Panel:  Recent Labs Lab 10/23/16 0713 10/24/16 0440 10/25/16 0636 10/26/16 0428 10/27/16 0340  NA 131* 133* 136 137 139  K 3.2* 4.3 4.2 3.9 3.7  CL 99* 103 107 106 104  CO2 24 23 22  19* 25  GLUCOSE 144* 110* 93 86 80  BUN 14 7 6 7 8   CREATININE 1.02* 0.62 0.62 0.61 0.61  CALCIUM 7.6* 7.3* 7.2* 7.4* 7.9*  MG 2.1 1.7 1.6* 2.2 1.9  PHOS 2.0* 1.4* 1.5* 1.5* 3.7   GFR: Estimated Creatinine Clearance: 75.1 mL/min (by C-G formula based on SCr of 0.61 mg/dL). Liver Function Tests:  Recent Labs Lab 10/22/16 0239 10/23/16 0713 10/24/16 0440 10/25/16 0636  AST 12* 19 19 16   ALT 9* 9* 11* 10*  ALKPHOS 49 65 71 70  BILITOT 1.0 1.1 0.9 0.5  PROT 5.8* 5.7* 5.4* 5.2*  ALBUMIN 2.8* 2.4* 2.2* 2.2*   No results for input(s): LIPASE, AMYLASE in the last 168 hours. No  results for input(s): AMMONIA in the last 168 hours. Coagulation Profile:  Recent Labs Lab 10/21/16 2348  INR 1.07   Cardiac Enzymes: No results for input(s): CKTOTAL, CKMB, CKMBINDEX, TROPONINI in the last 168 hours. BNP (last 3 results) No results for input(s): PROBNP in the last 8760 hours. HbA1C: No results for input(s): HGBA1C in the last 72 hours. CBG:  Recent Labs Lab 10/26/16 0735 10/26/16 1216 10/26/16 1712 10/26/16 2123 10/27/16 0747  GLUCAP 97 166* 119* 127* 97   Lipid Profile: No results for input(s): CHOL, HDL, LDLCALC, TRIG, CHOLHDL, LDLDIRECT in the last 72 hours. Thyroid Function Tests: No results for input(s): TSH, T4TOTAL, FREET4, T3FREE, THYROIDAB in the last 72 hours. Anemia Panel: No results for input(s): VITAMINB12, FOLATE, FERRITIN, TIBC, IRON, RETICCTPCT in the last 72 hours. Urine analysis: No results found for: COLORURINE, APPEARANCEUR, LABSPEC, [place], GLUCOSEU, [place], [place], [place], [place], UROBILINOGEN, NITRITE, LEUKOCYTESUR Sepsis Labs: @LABRCNTIP (procalcitonin:4,lacticidven:4)  ) Recent Results (from the past 240 hour(s))  Blood culture (routine x 2)     Status: None   Collection Time: 10/21/16  5:40 PM  Result Value Ref Range Status   Specimen Description BLOOD LEFT HAND  Final   Special Requests   Final    BOTTLES DRAWN AEROBIC AND ANAEROBIC Blood Culture adequate volume   Culture NO GROWTH 5 DAYS  Final   Report Status 10/26/2016 FINAL  Final  Blood culture (routine x 2)     Status: None   Collection Time: 10/21/16  5:45 PM  Result Value Ref Range Status   Specimen Description BLOOD LEFT ANTECUBITAL  Final   Special Requests IN PEDIATRIC BOTTLE Blood Culture adequate volume  Final   Culture NO GROWTH 5 DAYS  Final   Report Status 10/26/2016 FINAL  Final  MRSA PCR Screening     Status: None   Collection Time: 10/21/16 11:14 PM  Result Value Ref Range Status   MRSA by PCR NEGATIVE NEGATIVE Final    Comment:          The GeneXpert MRSA Assay (FDA approved for NASAL specimens only), is one component of a comprehensive MRSA colonization surveillance program. It is not intended to diagnose MRSA infection nor to guide or monitor treatment for MRSA infections.   Aerobic Culture (superficial specimen)  Status: None   Collection Time: 10/23/16  8:25 PM  Result Value Ref Range Status   Specimen Description WOUND  Final   Special Requests BACK SWAB  Final   Gram Stain RARE WBC SEEN FEW GRAM POSITIVE COCCI   Final   Culture ABUNDANT STAPHYLOCOCCUS AUREUS  Final   Report Status 10/27/2016 FINAL  Final   Organism ID, Bacteria STAPHYLOCOCCUS AUREUS  Final      Susceptibility   Staphylococcus aureus - MIC*    CIPROFLOXACIN <=0.5 SENSITIVE Sensitive     ERYTHROMYCIN >=8 RESISTANT Resistant     GENTAMICIN <=0.5 SENSITIVE Sensitive     OXACILLIN 0.5 SENSITIVE Sensitive     TETRACYCLINE <=1 SENSITIVE Sensitive     VANCOMYCIN <=0.5 SENSITIVE Sensitive     TRIMETH/SULFA <=10 SENSITIVE Sensitive     CLINDAMYCIN RESISTANT Resistant     RIFAMPIN <=0.5 SENSITIVE Sensitive     Inducible Clindamycin POSITIVE Resistant     * ABUNDANT STAPHYLOCOCCUS AUREUS      Radiology Studies: Dg Chest 2 View  Result Date: 10/26/2016 CLINICAL DATA:  Shortness of Breath EXAM: CHEST  2 VIEW COMPARISON:  October 26, 2016 FINDINGS: There are small pleural effusions bilaterally. There is mild bibasilar atelectasis. There is no edema or consolidation. Heart is mildly enlarged with pulmonary vascularity within normal limits. Patient is status post median sternotomy. No evident adenopathy. No bone lesions. IMPRESSION: Small pleural effusions bilaterally with bibasilar atelectasis. No frank edema or consolidation. Heart prominent with pulmonary vascularity within normal limits. Electronically Signed   By: Lowella Grip III M.D.   On: 10/26/2016 11:12   Dg Chest Port 1 View  Result Date: 10/26/2016 CLINICAL DATA:  Shortness of  Breath EXAM: PORTABLE CHEST 1 VIEW COMPARISON:  October 22, 2016 FINDINGS: There is a small right pleural effusion. There is mild interstitial edema. No airspace consolidation. Heart is slightly enlarged with mild pulmonary venous hypertension. No adenopathy. Patient is status post median sternotomy. The inferior-most sternal wire has a fracture along its rightward superior aspect. No bone lesions. No pneumothorax. IMPRESSION: Evidence a degree of congestive heart failure as described. No consolidation. Status post median sternotomy. Electronically Signed   By: Lowella Grip III M.D.   On: 10/26/2016 08:14     Scheduled Meds: . cephALEXin  500 mg Oral Q6H  . citalopram  40 mg Oral Daily  . desmopressin  0.3 mg Oral Daily  . estradiol  2 mg Oral BID  . famotidine  20 mg Oral QHS  . lactose free nutrition  237 mL Oral TID WC  . lidocaine  1 patch Transdermal Q24H  . Mesalamine  800 mg Oral BID  . multivitamin with minerals  1 tablet Oral Daily  . mycophenolate  500 mg Oral BID  . neomycin-bacitracin-polymyxin   Topical Daily  . pantoprazole  40 mg Oral Daily  . phosphorus  250 mg Oral TID WC & HS  . predniSONE  7.5 mg Oral Q breakfast  . progesterone  100 mg Oral QHS  . rOPINIRole  1-2 mg Oral QHS  . senna  1 tablet Oral BID   Continuous Infusions:    LOS: 6 days   Time Spent in minutes   45 minutes  Teresa Lee D.O. on 10/27/2016 at 4:07 PM  Between 7am to 7pm - Pager - [phone]  After 7pm go to [email or web address] - password [email or web address]  And look for the night coverage person covering for me after hours  [facility] Office  [phone]

## 2016-10-27 NOTE — Care Management Important Message (Signed)
Important Message  Patient Details  Name: Teresa Lee MRN: [id number] Date of Birth: September 24, 1960   Medicare Important Message Given:  Yes    Nathen May 10/27/2016, 9:44 AM

## 2016-10-28 LAB — CBC
HCT: 26.8 % — ABNORMAL LOW (ref 36.0–46.0)
Hemoglobin: 8.8 g/dL — ABNORMAL LOW (ref 12.0–15.0)
MCH: 31.7 pg (ref 26.0–34.0)
MCHC: 32.8 g/dL (ref 30.0–36.0)
MCV: 96.4 fL (ref 78.0–100.0)
PLATELETS: 305 10*3/uL (ref 150–400)
RBC: 2.78 MIL/uL — AB (ref 3.87–5.11)
RDW: 14 % (ref 11.5–15.5)
WBC: 15.3 10*3/uL — AB (ref 4.0–10.5)

## 2016-10-28 LAB — BASIC METABOLIC PANEL
ANION GAP: 13 (ref 5–15)
BUN: 12 mg/dL (ref 6–20)
CALCIUM: 8.5 mg/dL — AB (ref 8.9–10.3)
CO2: 25 mmol/L (ref 22–32)
Chloride: 103 mmol/L (ref 101–111)
Creatinine, Ser: 0.66 mg/dL (ref 0.44–1.00)
GLUCOSE: 87 mg/dL (ref 65–99)
POTASSIUM: 3.6 mmol/L (ref 3.5–5.1)
SODIUM: 141 mmol/L (ref 135–145)

## 2016-10-28 MED ORDER — CEPHALEXIN 500 MG PO CAPS: 500.0000 mg | ORAL_CAPSULE | Freq: Four times a day (QID) | ORAL | 0 refills | Status: DC

## 2016-10-28 MED ORDER — ONDANSETRON HCL 4 MG PO TABS: 4.0000 mg | ORAL_TABLET | Freq: Three times a day (TID) | ORAL | 0 refills | Status: DC | PRN

## 2016-10-28 MED ORDER — HYDROCODONE-ACETAMINOPHEN 10-325 MG PO TABS: 1.0000 | ORAL_TABLET | ORAL | 0 refills | Status: DC | PRN

## 2016-10-28 MED ORDER — FUROSEMIDE 20 MG PO TABS: 20.0000 mg | ORAL_TABLET | Freq: Every day | ORAL | 0 refills | Status: DC | PRN

## 2016-10-28 MED ORDER — ADULT MULTIVITAMIN W/MINERALS CH: 1.0000 | ORAL_TABLET | Freq: Every day | ORAL | 0 refills | Status: DC

## 2016-10-28 NOTE — Progress Notes (Signed)
[name] to be D/C'd to home per MD order.  Discussed with the patient and all questions fully answered.  VSS, Skin clean, dry and intact without evidence of skin break down, no evidence of skin tears noted. IV catheter discontinued intact. Site without signs and symptoms of complications. Dressing and pressure applied.  An After Visit Summary was printed and given to the patient. Patient received prescriptions.  D/c education completed with patient/family including follow up instructions, medication list, d/c activities limitations if indicated, with other d/c instructions as indicated by MD - patient able to verbalize understanding, all questions fully answered.   Patient instructed to return to ED, call 911, or call MD for any changes in condition.   Patient escorted via [place], and D/C home via private auto.  Morley Kos Price 10/28/2016 2:28 PM

## 2016-10-28 NOTE — Discharge Summary (Signed)
Physician Discharge Summary  Teresa Lee WKG:[id number] DOB: November 13, 1960 DOA: 10/21/2016  PCP: System, Pcp Not In  Admit date: 10/21/2016 Discharge date: 10/28/2016  Time spent: 45 minutes  Recommendations for Outpatient Follow-up:  Patient will be discharged to home.  Patient will need to follow up with primary care provider within one week of discharge.  Follow up with Dr. Wilhemina Bonito, or partner, in one week. Follow up with Dr. Michail Sermon, gastroenterology.  Patient should continue medications as prescribed.  Patient should follow a heart healthy diet.   Discharge Diagnoses:  Sepsis secondary to Acute cellulitis of the back Dyspnea, secondary to hypervolemia Intractable pain/Chronic pain with Fibromyalgia AKI Essential Hypertension Melanoma Myasthenia Gravis Asthma GERD Hypophosphatemia Hyponatremia Hypokalemia Hypomagnesemia Crohn's disease/IBS  Discharge Condition: Stable  Diet recommendation: heart healthy  Filed Weights   10/21/16 1730 10/26/16 1142 10/27/16 0552  Weight: 65.2 kg (143 lb 11.8 oz) 77.4 kg (170 lb 9.6 oz) 76.4 kg (168 lb 6.4 oz)    History of present illness:  on 10/21/16 by Dr. Angelita Ingles Groganis a 56 y.o.femalewith medical history significant of leukocytosis, myasthenia gravis, hyperlipidemia, hypertension, asthma , fibromyalgia, melanoma Presented with inflammation of her upper back where she have had melanoma removed last week. She states stiches wereplace and is first was healing well but then she developed some redness and swelling around the site.Initially she required initial incision 2 weeks ago but then had to go back and clear the borders. She patient had bandage over the area when a family member removed today there was a lot of swelling and redness around the site. She's been feeling lightheaded and unwell but no fevers or chills no nausea vomiting She's been taking her pain medications and higher amounts than usual to  control her discomfort and ranout. Patient has chronic leukocytosis but today her white blood cell count is up from baseline up to 24.  Hospital Course:  Sepsis secondary to Acute cellulitis of the back -was tachycardic, had leukocytosis on admission -s/p melanoma excision on 8/2 -Dr. Wilhemina Bonito, dermatology, consulted and appreciated. Removed sutures and able to drain purulent discharge, sent for culture -Fluid culture [facility] -CT chest with contrast: soft tissue edema along the mid and left upper back with mild associated skin thickening, may reflect cellulitis. No abscess or seroma -lactic acid improved from 2.3 to 0.9 -was placed on vancomycin/cefepime, discontinued and transitioned to keflex  -blood cultures show no growth to date -continue pain control -Discussed with Dr. Ronnald Ramp, patient will need to follow up with him or partner next week.   Dyspnea, secondary to hypervolemia -patient with no history of CHF, however, did however receive over 6L during this admission as patient had sepsis and AKI -IVF discontinued on 8/8 -patient feeling short of breath -BNP 323 -CXR: All pleural effusion, no infiltrate -last echo in 2015 showed normal EF -Echocardiogram obtained today showing EF of 65-70%, no regional wall motion abnormalities -was given IV lasix 12m x 2doses -Urine output the past 24 hours 1300cc -Will discharge patient with lasix 273mPO daily PRN for swelling or SOB.  Intractable pain/Chronic pain with Fibromyalgia -Continue tylenol PRN, hydrocodone PRN -Continue citalopram, ropinirole  AKI -Resolved, creatinine currently 0.66 (baseline) -peaked at 1.02 -likely secondary to sepsis -Continue to monitor BMP  Essential Hypertension -home meds held, continue upon discharge  Melanoma -Continue outpatient follow up with Dr. DaWilhemina BonitoDermatology  Myasthenia Gravis -Continue cellcept, prednisone -[facility] -Admitting physician discussed  with neurology, continue  current treatment, if patient decompensates, will formally consult  Asthma -Currently stable -Continue nebs PRN  GERD -Continue PPI  Hypophosphatemia -Resolved  Hyponatremia -resolved  Hypokalemia -resolved  Hypomagnesemia -resolved   Crohn's disease/IBS -Stable -Continue mesalamine and bowel regimen  Consultants Dr. Wilhemina Bonito, Dermatology Neurology, via phone, by admitting physician   Procedures  Echocardiogram  Discharge Exam: Vitals:   10/27/16 2121 10/28/16 0447  BP: (!) 163/81 (!) 141/75  Pulse: 69 70  Resp: 18 18  Temp: 98.1 F (36.7 C) 97.7 F (36.5 C)  SpO2: 100% 94%   Patient feeling her breathing has improved and denies further shortness of breath. Denies chest pain, abdominal pain, N/V. States she has crohns disease and IBS. Denies dizziness or headache. Continues to have some pain in her back.    General: Well developed, well nourished, NAD  HEENT: NCAT, mucous membranes moist.  Cardiovascular: S1 S2 auscultated, no rubs, murmurs or gallops. Regular rate and rhythm.  Respiratory: Clear to auscultation bilaterally with equal chest rise  Abdomen: Soft, nontender, nondistended, + bowel sounds  Extremities: warm dry without cyanosis clubbing or edema  Neuro: AAOx3, nonfocal   Skin: left upper back erythema improved, mild discharge noted.   Psych: Normal affect and demeanor with intact judgement and insight  Discharge Instructions Discharge Instructions    Discharge instructions    Complete by:  As directed    Patient will be discharged to home.  Patient will need to follow up with primary care provider within one week of discharge.  Follow up with Dr. Wilhemina Bonito, or partner, in one week. Follow up with Dr. Michail Sermon, gastroenterology.  Patient should continue medications as prescribed.  Patient should follow a heart healthy diet.   You may shower, cover up wound or only allow water to wash over your wound. Do  not scrub. Use benadryl around the wound not on the incision for itching.     Current Discharge Medication List    START taking these medications   Details  cephALEXin (KEFLEX) 500 MG capsule Take 1 capsule (500 mg total) by mouth every 6 (six) hours. Qty: 15 capsule, Refills: 0    furosemide (LASIX) 20 MG tablet Take 1 tablet (20 mg total) by mouth daily as needed for edema (shortness of breath). Qty: 20 tablet, Refills: 0    Multiple Vitamin (MULTIVITAMIN WITH MINERALS) TABS tablet Take 1 tablet by mouth daily. Qty: 30 tablet, Refills: 0      CONTINUE these medications which have CHANGED   Details  HYDROcodone-acetaminophen (NORCO) 10-325 MG tablet Take 1 tablet by mouth every 4 (four) hours as needed. Qty: 20 tablet, Refills: 0    ondansetron (ZOFRAN) 4 MG tablet Take 1 tablet (4 mg total) by mouth every 8 (eight) hours as needed for nausea or vomiting. Qty: 30 tablet, Refills: 0      CONTINUE these medications which have NOT CHANGED   Details  albuterol (PROVENTIL) (2.5 MG/3ML) 0.083% nebulizer solution Take 2.5 mg by nebulization every 6 (six) hours as needed for wheezing.    Cholecalciferol (VITAMIN D-3) 5000 UNITS TABS Take 1 tablet by mouth daily.    citalopram (CELEXA) 40 MG tablet Take 40 mg by mouth daily.    DELZICOL 400 MG CPDR DR capsule Take 800 mg by mouth 2 (two) times daily.    desmopressin (DDAVP) 0.1 MG tablet Take 0.3 mg by mouth daily.     diclofenac sodium (VOLTAREN) 1 % GEL Apply 2 g topically 4 (four) times daily as  needed (for back/shoulder pain).    estradiol (ESTRACE) 2 MG tablet Take 2 mg by mouth 2 (two) times daily.     famotidine (PEPCID) 20 MG tablet Take 20 mg by mouth at bedtime.    losartan (COZAAR) 50 MG tablet Take 50 mg by mouth daily.    meloxicam (MOBIC) 15 MG tablet Take 15 mg by mouth daily.    mycophenolate (CELLCEPT) 500 MG tablet Take 500 mg by mouth 2 (two) times daily.     pantoprazole (PROTONIX) 40 MG tablet Take 40 mg  by mouth daily.    potassium chloride (KLOR-CON) 20 MEQ packet Take 10-20 mEq by mouth daily.     predniSONE (DELTASONE) 5 MG tablet Take 7.5 mg by mouth daily.     progesterone (PROMETRIUM) 100 MG capsule Take 100 mg by mouth at bedtime.    rOPINIRole (REQUIP) 1 MG tablet Take 1-2 mg by mouth at bedtime.    triamterene-hydrochlorothiazide (DYAZIDE) 37.5-25 MG per capsule Take 1 capsule by mouth every morning.    FLUoxetine (PROZAC) 40 MG capsule Take 40 mg by mouth daily.    gabapentin (NEURONTIN) 800 MG tablet Take 800 mg by mouth 2 (two) times daily.    Omega-3 Fatty Acids (FISH OIL) 1000 MG CAPS Take 2 capsules (2,000 mg total) by mouth 2 (two) times daily. Qty: 120 capsule, Refills: 5       Allergies  Allergen Reactions  . Percocet [Oxycodone-Acetaminophen] Hives  . Sulfamethoxazole-Trimethoprim Hives  . Nucynta [Tapentadol]     Headache   . Orange Concentrate [Flavoring Agent]     Acid reflux  . Other     Some nuts cause a rash  . Tomato     Burns skin  . Cefixime     Due to myasthenia gravis   Follow-up Information    Primary care physician. Schedule an appointment as soon as possible for a visit in 1 week(s).   Why:  Hospital follow up       Danella Sensing, MD. Schedule an appointment as soon as possible for a visit in 1 week(s).   Specialty:  Dermatology Why:  Follow up Contact information: [place] [address] [phone]            The results of significant diagnostics from this hospitalization (including imaging, microbiology, ancillary and laboratory) are listed below for reference.    Significant Diagnostic Studies: Dg Chest 2 View  Result Date: 10/26/2016 CLINICAL DATA:  Shortness of Breath EXAM: CHEST  2 VIEW COMPARISON:  October 26, 2016 FINDINGS: There are small pleural effusions bilaterally. There is mild bibasilar atelectasis. There is no edema or consolidation. Heart is mildly enlarged with pulmonary vascularity  within normal limits. Patient is status post median sternotomy. No evident adenopathy. No bone lesions. IMPRESSION: Small pleural effusions bilaterally with bibasilar atelectasis. No frank edema or consolidation. Heart prominent with pulmonary vascularity within normal limits. Electronically Signed   By: Lowella Grip III M.D.   On: 10/26/2016 11:12   Ct Chest W Contrast  Result Date: 10/21/2016 CLINICAL DATA:  Postoperative infection, acute onset, status post resection of melanoma at the upper back. Erythema and swelling about the surgical site. Fatigue and body aches. Leukocytosis. Initial encounter. EXAM: CT CHEST WITH CONTRAST TECHNIQUE: Multidetector CT imaging of the chest was performed during intravenous contrast administration. CONTRAST:  68m ISOVUE-300 IOPAMIDOL (ISOVUE-300) INJECTION 61% COMPARISON:  Chest radiograph performed 10/01/2009 FINDINGS: Cardiovascular: The heart is normal in size. The thoracic aorta is unremarkable  in appearance. The great vessels are within normal limits. No calcific atherosclerotic disease is seen. Mediastinum/Nodes: No mediastinal lymphadenopathy is appreciated. No pericardial effusion is identified. The thyroid gland is unremarkable. No axillary lymphadenopathy is seen. The patient is status post median sternotomy. Lungs/Pleura: Minimal scarring is noted at the lung bases. No pleural effusion or pneumothorax is seen. No masses are identified. Upper Abdomen: The visualized portions of the liver and spleen are unremarkable. The patient is status post cholecystectomy, with clips noted at the gallbladder fossa. The visualized portions of the pancreas, adrenal glands and kidneys are within normal limits. Musculoskeletal: Soft tissue edema is seen tracking along the mid and left upper back, with mild associated skin thickening. This may reflect cellulitis, given the patient's symptoms. No abscess is seen at this time. No postoperative seroma is identified. No acute osseous  abnormalities are identified. The visualized musculature is unremarkable in appearance. IMPRESSION: 1. Soft tissue edema along the mid and left upper back, with mild associated skin thickening. This may reflect cellulitis, given the patient's symptoms. No evidence of abscess or postoperative seroma at this time. 2. Minimal scarring at the lung bases.  Lungs otherwise clear. Electronically Signed   By: Garald Balding M.D.   On: 10/21/2016 23:08   Dg Chest Port 1 View  Result Date: 10/26/2016 CLINICAL DATA:  Shortness of Breath EXAM: PORTABLE CHEST 1 VIEW COMPARISON:  October 22, 2016 FINDINGS: There is a small right pleural effusion. There is mild interstitial edema. No airspace consolidation. Heart is slightly enlarged with mild pulmonary venous hypertension. No adenopathy. Patient is status post median sternotomy. The inferior-most sternal wire has a fracture along its rightward superior aspect. No bone lesions. No pneumothorax. IMPRESSION: Evidence a degree of congestive heart failure as described. No consolidation. Status post median sternotomy. Electronically Signed   By: Lowella Grip III M.D.   On: 10/26/2016 08:14   Dg Chest Port 1 View  Result Date: 10/22/2016 CLINICAL DATA:  Sepsis.  Back infection after melanoma removal. EXAM: PORTABLE CHEST 1 VIEW COMPARISON:  CT chest October 21, 2016 FINDINGS: Cardiomediastinal silhouette is normal for this low inspiratory portable examination with crowded vascular markings. Bibasilar strandy densities without pleural effusion or focal consolidation. Mild bronchitic changes. Status post median sternotomy for CABG with multiple fractured wires. Soft tissue planes and included osseous structures are nonsuspicious. IMPRESSION: Mild bronchitic changes with bibasilar atelectasis. Electronically Signed   By: Elon Alas M.D.   On: 10/22/2016 00:54    Microbiology: Recent Results (from the past 240 hour(s))  Blood culture (routine x 2)     Status: None    Collection Time: 10/21/16  5:40 PM  Result Value Ref Range Status   Specimen Description BLOOD LEFT HAND  Final   Special Requests   Final    BOTTLES DRAWN AEROBIC AND ANAEROBIC Blood Culture adequate volume   Culture NO GROWTH 5 DAYS  Final   Report Status 10/26/2016 FINAL  Final  Blood culture (routine x 2)     Status: None   Collection Time: 10/21/16  5:45 PM  Result Value Ref Range Status   Specimen Description BLOOD LEFT ANTECUBITAL  Final   Special Requests IN PEDIATRIC BOTTLE Blood Culture adequate volume  Final   Culture NO GROWTH 5 DAYS  Final   Report Status 10/26/2016 FINAL  Final  MRSA PCR Screening     Status: None   Collection Time: 10/21/16 11:14 PM  Result Value Ref Range Status   MRSA by PCR NEGATIVE  NEGATIVE Final    Comment:        The GeneXpert MRSA Assay (FDA approved for NASAL specimens only), is one component of a comprehensive MRSA colonization surveillance program. It is not intended to diagnose MRSA infection nor to guide or monitor treatment for MRSA infections.   Aerobic Culture (superficial specimen)     Status: None   Collection Time: 10/23/16  8:25 PM  Result Value Ref Range Status   Specimen Description WOUND  Final   Special Requests BACK SWAB  Final   Gram Stain RARE WBC SEEN FEW GRAM POSITIVE COCCI   Final   Culture ABUNDANT STAPHYLOCOCCUS AUREUS  Final   Report Status 10/27/2016 FINAL  Final   Organism ID, Bacteria STAPHYLOCOCCUS AUREUS  Final      Susceptibility   Staphylococcus aureus - MIC*    CIPROFLOXACIN <=0.5 SENSITIVE Sensitive     ERYTHROMYCIN >=8 RESISTANT Resistant     GENTAMICIN <=0.5 SENSITIVE Sensitive     OXACILLIN 0.5 SENSITIVE Sensitive     TETRACYCLINE <=1 SENSITIVE Sensitive     VANCOMYCIN <=0.5 SENSITIVE Sensitive     TRIMETH/SULFA <=10 SENSITIVE Sensitive     CLINDAMYCIN RESISTANT Resistant     RIFAMPIN <=0.5 SENSITIVE Sensitive     Inducible Clindamycin POSITIVE Resistant     * ABUNDANT STAPHYLOCOCCUS  AUREUS     Labs: Basic Metabolic Panel:  Recent Labs Lab 10/23/16 0713 10/24/16 0440 10/25/16 0636 10/26/16 0428 10/27/16 0340 10/28/16 0400  NA 131* 133* 136 137 139 141  K 3.2* 4.3 4.2 3.9 3.7 3.6  CL 99* 103 107 106 104 103  CO2 24 23 22  19* 25 25  GLUCOSE 144* 110* 93 86 80 87  BUN 14 7 6 7 8 12   CREATININE 1.02* 0.62 0.62 0.61 0.61 0.66  CALCIUM 7.6* 7.3* 7.2* 7.4* 7.9* 8.5*  MG 2.1 1.7 1.6* 2.2 1.9  --   PHOS 2.0* 1.4* 1.5* 1.5* 3.7  --    Liver Function Tests:  Recent Labs Lab 10/22/16 0239 10/23/16 0713 10/24/16 0440 10/25/16 0636  AST 12* 19 19 16   ALT 9* 9* 11* 10*  ALKPHOS 49 65 71 70  BILITOT 1.0 1.1 0.9 0.5  PROT 5.8* 5.7* 5.4* 5.2*  ALBUMIN 2.8* 2.4* 2.2* 2.2*   No results for input(s): LIPASE, AMYLASE in the last 168 hours. No results for input(s): AMMONIA in the last 168 hours. CBC:  Recent Labs Lab 10/21/16 1631  10/23/16 0713 10/24/16 0440 10/25/16 0636 10/26/16 0428 10/27/16 0340 10/28/16 0400  WBC 24.6*  < > 17.3* 13.3* 15.5* 17.2* 15.6* 15.3*  NEUTROABS 20.8*  --  15.1* 11.5* 12.4*  --   --   --   HGB 11.9*  < > 9.8* 9.4* 8.3* 9.0* 9.0* 8.8*  HCT 34.7*  < > 29.1* 27.8* 25.3* 27.1* 27.1* 26.8*  MCV 94.3  < > 94.5 94.2 95.5 97.1 96.1 96.4  PLT 214  < > 217 216 231 251 260 305  < > = values in this interval not displayed. Cardiac Enzymes: No results for input(s): CKTOTAL, CKMB, CKMBINDEX, TROPONINI in the last 168 hours. BNP: BNP (last 3 results)  Recent Labs  10/26/16 0428  BNP 323.3*    ProBNP (last 3 results) No results for input(s): PROBNP in the last 8760 hours.  CBG:  Recent Labs Lab 10/26/16 0735 10/26/16 1216 10/26/16 1712 10/26/16 2123 10/27/16 0747  GLUCAP 97 166* 119* 127* 97       Signed:  Armetta Henri  [facility] 10/28/2016, 11:51 AM

## 2016-11-06 DIAGNOSIS — M47817 Spondylosis without myelopathy or radiculopathy, lumbosacral region: Secondary | ICD-10-CM | POA: Diagnosis not present

## 2016-11-06 DIAGNOSIS — M5417 Radiculopathy, lumbosacral region: Secondary | ICD-10-CM | POA: Diagnosis not present

## 2016-11-06 DIAGNOSIS — G894 Chronic pain syndrome: Secondary | ICD-10-CM | POA: Diagnosis not present

## 2016-11-06 DIAGNOSIS — M5136 Other intervertebral disc degeneration, lumbar region: Secondary | ICD-10-CM | POA: Diagnosis not present

## 2016-11-17 DIAGNOSIS — E538 Deficiency of other specified B group vitamins: Secondary | ICD-10-CM | POA: Diagnosis not present

## 2016-11-17 DIAGNOSIS — R69 Illness, unspecified: Secondary | ICD-10-CM | POA: Diagnosis not present

## 2016-11-17 DIAGNOSIS — M797 Fibromyalgia: Secondary | ICD-10-CM | POA: Diagnosis not present

## 2016-11-17 DIAGNOSIS — G7000 Myasthenia gravis without (acute) exacerbation: Secondary | ICD-10-CM | POA: Diagnosis not present

## 2016-12-05 DIAGNOSIS — M4316 Spondylolisthesis, lumbar region: Secondary | ICD-10-CM | POA: Diagnosis not present

## 2016-12-05 DIAGNOSIS — M5030 Other cervical disc degeneration, unspecified cervical region: Secondary | ICD-10-CM | POA: Diagnosis not present

## 2016-12-05 DIAGNOSIS — M4722 Other spondylosis with radiculopathy, cervical region: Secondary | ICD-10-CM | POA: Diagnosis not present

## 2016-12-05 DIAGNOSIS — M47816 Spondylosis without myelopathy or radiculopathy, lumbar region: Secondary | ICD-10-CM | POA: Diagnosis not present

## 2016-12-05 DIAGNOSIS — M546 Pain in thoracic spine: Secondary | ICD-10-CM | POA: Diagnosis not present

## 2016-12-05 DIAGNOSIS — M549 Dorsalgia, unspecified: Secondary | ICD-10-CM | POA: Diagnosis not present

## 2016-12-05 DIAGNOSIS — M542 Cervicalgia: Secondary | ICD-10-CM | POA: Diagnosis not present

## 2016-12-11 DIAGNOSIS — K5080 Crohn's disease of both small and large intestine without complications: Secondary | ICD-10-CM | POA: Diagnosis not present

## 2016-12-12 DIAGNOSIS — M542 Cervicalgia: Secondary | ICD-10-CM | POA: Diagnosis not present

## 2016-12-12 DIAGNOSIS — G894 Chronic pain syndrome: Secondary | ICD-10-CM | POA: Diagnosis not present

## 2016-12-12 DIAGNOSIS — M47816 Spondylosis without myelopathy or radiculopathy, lumbar region: Secondary | ICD-10-CM | POA: Diagnosis not present

## 2016-12-12 DIAGNOSIS — M47812 Spondylosis without myelopathy or radiculopathy, cervical region: Secondary | ICD-10-CM | POA: Diagnosis not present

## 2016-12-26 ENCOUNTER — Ambulatory Visit (INDEPENDENT_AMBULATORY_CARE_PROVIDER_SITE_OTHER): Payer: Medicare HMO | Admitting: Family Medicine

## 2016-12-26 ENCOUNTER — Encounter: Payer: Self-pay | Admitting: Family Medicine

## 2016-12-26 VITALS — BP 128/78 | HR 91 | Resp 12 | Ht 61.0 in | Wt 144.4 lb

## 2016-12-26 DIAGNOSIS — E785 Hyperlipidemia, unspecified: Secondary | ICD-10-CM

## 2016-12-26 DIAGNOSIS — G2581 Restless legs syndrome: Secondary | ICD-10-CM | POA: Diagnosis not present

## 2016-12-26 DIAGNOSIS — E559 Vitamin D deficiency, unspecified: Secondary | ICD-10-CM | POA: Diagnosis not present

## 2016-12-26 DIAGNOSIS — J4520 Mild intermittent asthma, uncomplicated: Secondary | ICD-10-CM

## 2016-12-26 DIAGNOSIS — F419 Anxiety disorder, unspecified: Secondary | ICD-10-CM | POA: Diagnosis not present

## 2016-12-26 DIAGNOSIS — R69 Illness, unspecified: Secondary | ICD-10-CM | POA: Diagnosis not present

## 2016-12-26 DIAGNOSIS — K219 Gastro-esophageal reflux disease without esophagitis: Secondary | ICD-10-CM | POA: Diagnosis not present

## 2016-12-26 DIAGNOSIS — D649 Anemia, unspecified: Secondary | ICD-10-CM | POA: Diagnosis not present

## 2016-12-26 DIAGNOSIS — E538 Deficiency of other specified B group vitamins: Secondary | ICD-10-CM | POA: Insufficient documentation

## 2016-12-26 DIAGNOSIS — E876 Hypokalemia: Secondary | ICD-10-CM | POA: Diagnosis not present

## 2016-12-26 DIAGNOSIS — I10 Essential (primary) hypertension: Secondary | ICD-10-CM | POA: Diagnosis not present

## 2016-12-26 LAB — LIPID PANEL
CHOL/HDL RATIO: 5
Cholesterol: 158 mg/dL (ref 0–200)
HDL: 34.6 mg/dL — AB (ref >39.00)

## 2016-12-26 LAB — CBC WITH DIFFERENTIAL/PLATELET
BASOS ABS: 0.1 10*3/uL (ref 0.0–0.1)
BASOS PCT: 0.4 % (ref 0.0–3.0)
Eosinophils Absolute: 0.3 10*3/uL (ref 0.0–0.7)
Eosinophils Relative: 2 % (ref 0.0–5.0)
HCT: 37.6 % (ref 36.0–46.0)
Hemoglobin: 12.8 g/dL (ref 12.0–15.0)
LYMPHS ABS: 2.4 10*3/uL (ref 0.7–4.0)
Lymphocytes Relative: 13.9 % (ref 12.0–46.0)
MCHC: 33.9 g/dL (ref 30.0–36.0)
MCV: 95.8 fl (ref 78.0–100.0)
MONOS PCT: 6.7 % (ref 3.0–12.0)
Monocytes Absolute: 1.1 10*3/uL — ABNORMAL HIGH (ref 0.1–1.0)
NEUTROS ABS: 13.2 10*3/uL — AB (ref 1.4–7.7)
Neutrophils Relative %: 77 % (ref 43.0–77.0)
PLATELETS: 263 10*3/uL (ref 150.0–400.0)
RBC: 3.93 Mil/uL (ref 3.87–5.11)
RDW: 13.9 % (ref 11.5–15.5)
WBC: 17.1 10*3/uL — ABNORMAL HIGH (ref 4.0–10.5)

## 2016-12-26 LAB — VITAMIN D 25 HYDROXY (VIT D DEFICIENCY, FRACTURES): VITD: 25.59 ng/mL — AB (ref 30.00–100.00)

## 2016-12-26 LAB — LDL CHOLESTEROL, DIRECT: Direct LDL: 52 mg/dL

## 2016-12-26 LAB — VITAMIN B12: Vitamin B-12: 255 pg/mL (ref 211–911)

## 2016-12-26 NOTE — Patient Instructions (Signed)
A few things to remember from today's visit:   Hyperlipidemia, unspecified hyperlipidemia type - Plan: Lipid panel  Essential hypertension  Hypokalemia  RLS (restless legs syndrome)  Anemia, unspecified type - Plan: CBC with Differential/Platelet  B12 deficiency - Plan: Vitamin B12  Vitamin D deficiency, unspecified - Plan: VITAMIN D 25 Hydroxy (Vit-D Deficiency, Fractures)   Please be sure medication list is accurate. If a new problem present, please set up appointment sooner than planned today.

## 2016-12-26 NOTE — Progress Notes (Signed)
HPI:   Teresa Lee is a 56 y.o. female, who is here today to establish care.  Former PCP: Dr Noah Delaine Last preventive routine visit: 04/2016  Chronic medical problems: Myastenia gravis,anxiety,asthma,palpitations,HLD,HTN,RLS, fibromyalgia,skin cancer (melanoma), cervicalgia (neurosurgeon: Dr Awanda Mink), B12 def, and crohn's disease among some.  She follows with pain management (Dr Vira Blanco), dermatologist (Dr Ronnald Ramp), neurologist (Dr Gayleen Orem gynecologist.  Concerns today: F/U on some of her chronic medical problems and labs needed.  HLD: On non pharmacologic treatment. Last FLP in 04/2016: TC 203, LDL 102,TG 236, and HDL 54.  Anxiety: She is currently on Celexa 40 mg daily. Medication helps her to calm down at night. She has taken it for 2 years. She denies suicidal thoughts. Sleeps from 5-6 hours.  Anemia:  Not aware of Dx. She is not on iron supplementation.  Lab Results  Component Value Date   WBC 15.3 (H) 10/28/2016   HGB 8.8 (L) 10/28/2016   HCT 26.8 (L) 10/28/2016   MCV 96.4 10/28/2016   PLT 305 10/28/2016   Hx of B12 deficiency, she is on B12 1000 mcg Im q 3 months.  Asthma and allergic rhinitis:  Albuterol inh as needed and neb sl if sick with respiratory infection. Symptoms are occasional.   GERD: Followed with Dr Melvyn Novas fro dysphonia and Dx with GERD. Heartburn aggravated by some of her meds.  Currently on Protonix 40 mg daily. She also takes OTC Pepcid as needed bid.  Denies abdominal pain, nausea, vomiting, changes in bowel habits, blood in stool or melena.  Hypertension:   Dx many years ago.  Currently on Cozaar 50 mg daily and Triamterene-HTCZ 37.5/25 mg daily.   She is taking medications as instructed, no side effects reported.  She has not noted unusual headache, visual changes, exertional chest pain, dyspnea,  focal weakness, or edema.   Lab Results  Component Value Date   CREATININE 0.66 10/28/2016   BUN 12 10/28/2016     NA 141 10/28/2016   K 3.6 10/28/2016   CL 103 10/28/2016   CO2 25 10/28/2016   HypoK+ , she is on KLOR 20 meq daily.  +Fatigue: Chronic.  Lab Results  Component Value Date   TSH 1.654 10/22/2016   RLS: On Requip 1 mg 1-2 tabs daily.   Vit D deficiency: She is on OTC Vit D3 5000 U daily.  Review of Systems  Constitutional: Positive for fatigue. Negative for activity change, appetite change and fever.  HENT: Negative for mouth sores, nosebleeds, sore throat and trouble swallowing.   Eyes: Negative for redness and visual disturbance.  Respiratory: Negative for cough, shortness of breath and wheezing.   Cardiovascular: Negative for chest pain, palpitations and leg swelling.  Gastrointestinal: Negative for abdominal pain, nausea and vomiting.       Negative for changes in bowel habits.  Endocrine: Negative for cold intolerance, heat intolerance, polydipsia, polyphagia and polyuria.  Genitourinary: Negative for decreased urine volume, dysuria and hematuria.  Musculoskeletal: Positive for arthralgias, back pain and myalgias. Negative for gait problem.  Skin: Negative for pallor and rash.  Allergic/Immunologic: Positive for environmental allergies.  Neurological: Negative for syncope, weakness and headaches.  Hematological: Negative for adenopathy. Does not bruise/bleed easily.  Psychiatric/Behavioral: Positive for sleep disturbance. Negative for confusion. The patient is nervous/anxious.       Current Outpatient Prescriptions on File Prior to Visit  Medication Sig Dispense Refill  . albuterol (PROVENTIL) (2.5 MG/3ML) 0.083% nebulizer solution Take 2.5 mg by nebulization every 6 (  six) hours as needed for wheezing.    . Cholecalciferol (VITAMIN D-3) 5000 UNITS TABS Take 1 tablet by mouth daily.    . citalopram (CELEXA) 40 MG tablet Take 40 mg by mouth daily.    . DELZICOL 400 MG CPDR DR capsule Take 800 mg by mouth 2 (two) times daily.    Marland Kitchen desmopressin (DDAVP) 0.1 MG tablet Take  0.3 mg by mouth daily.     . diclofenac sodium (VOLTAREN) 1 % GEL Apply 2 g topically 4 (four) times daily as needed (for back/shoulder pain).    Marland Kitchen estradiol (ESTRACE) 2 MG tablet Take 2 mg by mouth 2 (two) times daily.     . famotidine (PEPCID) 20 MG tablet Take 20 mg by mouth at bedtime.    . furosemide (LASIX) 20 MG tablet Take 1 tablet (20 mg total) by mouth daily as needed for edema (shortness of breath). 20 tablet 0  . gabapentin (NEURONTIN) 800 MG tablet Take 800 mg by mouth 2 (two) times daily.    Marland Kitchen losartan (COZAAR) 50 MG tablet Take 50 mg by mouth daily.    . meloxicam (MOBIC) 15 MG tablet Take 15 mg by mouth daily.    . Multiple Vitamin (MULTIVITAMIN WITH MINERALS) TABS tablet Take 1 tablet by mouth daily. 30 tablet 0  . mycophenolate (CELLCEPT) 500 MG tablet Take 500 mg by mouth 2 (two) times daily.     . Omega-3 Fatty Acids (FISH OIL) 1000 MG CAPS Take 2 capsules (2,000 mg total) by mouth 2 (two) times daily. 120 capsule 5  . ondansetron (ZOFRAN) 4 MG tablet Take 1 tablet (4 mg total) by mouth every 8 (eight) hours as needed for nausea or vomiting. 30 tablet 0  . pantoprazole (PROTONIX) 40 MG tablet Take 40 mg by mouth daily.    . potassium chloride (KLOR-CON) 20 MEQ packet Take 10-20 mEq by mouth daily.     . predniSONE (DELTASONE) 5 MG tablet Take 7.5 mg by mouth daily.     . progesterone (PROMETRIUM) 100 MG capsule Take 100 mg by mouth at bedtime.    Marland Kitchen rOPINIRole (REQUIP) 1 MG tablet Take 1-2 mg by mouth at bedtime.    . triamterene-hydrochlorothiazide (DYAZIDE) 37.5-25 MG per capsule Take 1 capsule by mouth every morning.     No current facility-administered medications on file prior to visit.      Past Medical History:  Diagnosis Date  . Arthritis    osteoarthritis  . Asthma   . Blind left eye   . Cancer ([place])    skin  . Crohn's disease ([place])   . CTS (carpal tunnel syndrome)   . DJD (degenerative joint disease)    Both cervical spine and LS spine  . Fibromyalgia     . GERD (gastroesophageal reflux disease)   . Heart murmur   . Herniated nucleus pulposus   . Hyperlipidemia   . Hypertension   . IBS (irritable bowel syndrome)   . Leukocytosis   . Myasthenia gravis   . Myasthenia gravis ([place])    Allergies  Allergen Reactions  . Percocet [Oxycodone-Acetaminophen] Hives  . Sulfamethoxazole-Trimethoprim Hives  . Nucynta [Tapentadol]     Headache   . Orange Concentrate [Flavoring Agent]     Acid reflux  . Other     Some nuts cause a rash  . Tomato     Burns skin  . Cefixime     Due to myasthenia gravis    Family History  Problem Relation  Age of Onset  . Asthma Daughter   . Heart disease Mother   . Cancer Mother     Social History   Social History  . Marital status: Married    Spouse name: N/A  . Number of children: 1  . Years of education: N/A   Occupational History  . disabled    Social History Main Topics  . Smoking status: Never Smoker  . Smokeless tobacco: Never Used  . Alcohol use Yes     Comment: occassional wine  . Drug use: No  . Sexual activity: Not Asked   Other Topics Concern  . None   Social History Narrative  . None    Vitals:   12/26/16 0923  BP: 128/78  Pulse: 91  Resp: 12  SpO2: 96%    Body mass index is 27.28 kg/m.   Physical Exam  Nursing note and vitals reviewed. Constitutional: She is oriented to person, place, and time. She appears well-developed. No distress.  HENT:  Head: Normocephalic and atraumatic.  Mouth/Throat: Oropharynx is clear and moist and mucous membranes are normal.  Eyes: Conjunctivae and EOM are normal. Left pupil is not reactive.  Left eye blindness, there is not response to direct light stimulation but afferent pupillary reflex is present.  Neck: No tracheal deviation present. No thyroid mass and no thyromegaly present.  Cardiovascular: Normal rate and regular rhythm.   No murmur heard. Pulses:      Dorsalis pedis pulses are 2+ on the right side, and 2+ on the  left side.  Respiratory: Effort normal and breath sounds normal. No respiratory distress.  GI: Soft. She exhibits no mass. There is no hepatomegaly. There is no tenderness.  Musculoskeletal: She exhibits no edema or tenderness.  Lymphadenopathy:    She has no cervical adenopathy.  Neurological: She is alert and oriented to person, place, and time. She has normal strength. Coordination and gait normal.  Skin: Skin is warm. No erythema.  Psychiatric: Her mood appears anxious.  Well groomed, good eye contact.    ASSESSMENT AND PLAN:   Ms. Teresa Lee was seen today for establish care.  Diagnoses and all orders for this visit:  Mild intermittent asthma, unspecified whether complicated  Well controlled. No changes in current management. F/U in 12 months.  Hyperlipidemia, unspecified hyperlipidemia type  Continue low fat diet. We will follow labs done today and will give further recommendations accordingly. F/U in 6-12 months.  -     Lipid panel  Essential hypertension Adequately controlled. No changes in current management. DASH diet recommended. Eye exam recommended at least annually. F/U in 5 months, before if needed.  Hypokalemia  No changes in current management. Some side effects of diuretics discussed. F/U in 5 months.  RLS (restless legs syndrome)  Well controlled. No changes in current management. F/U in 6-12 months.  Anemia, unspecified type  Further recommendations will be given according to CBC esults.  -     CBC with Differential/Platelet  B12 deficiency  No changes in current management, will follow labs done today and will give further recommendations accordingly.  -     Vitamin B12  Vitamin D deficiency, unspecified  No changes in vit D dose, we will follow labs done today and will give further recommendations accordingly.  -     VITAMIN D 25 Hydroxy (Vit-D Deficiency, Fractures)  Gastroesophageal reflux disease, esophagitis presence not  specified  GERD precautions discussed. Stable, no changes in Protonix dose. F/U in 6-12 months.   Anxiety  disorder, unspecified type  Stable. No changes in current management. F/U in 5-6 months.     Maddisyn Hegwood G. [place], MD  [facility]. [place] office.

## 2017-01-02 ENCOUNTER — Ambulatory Visit: Payer: Medicare HMO | Admitting: Physical Therapy

## 2017-01-03 ENCOUNTER — Ambulatory Visit: Payer: Medicare HMO | Attending: Physician Assistant | Admitting: Physical Therapy

## 2017-01-03 ENCOUNTER — Encounter: Payer: Self-pay | Admitting: Physical Therapy

## 2017-01-03 DIAGNOSIS — M542 Cervicalgia: Secondary | ICD-10-CM | POA: Diagnosis not present

## 2017-01-03 DIAGNOSIS — M6281 Muscle weakness (generalized): Secondary | ICD-10-CM | POA: Diagnosis present

## 2017-01-03 NOTE — Therapy (Signed)
[facility] [place], [name], [address] Phone: [phone]   Fax:  [phone]  Physical Therapy Evaluation  Patient Details  Name: Teresa Lee MRN: [id number] Date of Birth: 08/30/1960 No Data Recorded  Encounter Date: 01/03/2017      PT End of Session - 01/03/17 1053    Visit Number 1   Number of Visits 12   Authorization Type medicare, G-codes every 10th visit, Kx modifier after 15 visit   PT Start Time 1040   PT Stop Time 1130   PT Time Calculation (min) 50 min   Activity Tolerance Patient tolerated treatment well   Behavior During Therapy [facility] for tasks assessed/performed      Past Medical History:  Diagnosis Date  . Arthritis    osteoarthritis  . Asthma   . Blind left eye   . Cancer ( [place])    skin  . Crohn's disease ([place])   . CTS (carpal tunnel syndrome)   . DJD (degenerative joint disease)    Both cervical spine and LS spine  . Fibromyalgia   . GERD (gastroesophageal reflux disease)   . Heart murmur   . Herniated nucleus pulposus   . Hyperlipidemia   . Hypertension   . IBS (irritable bowel syndrome)   . Leukocytosis   . Myasthenia gravis   . Myasthenia gravis [facility])     Past Surgical History:  Procedure Laterality Date  . BREAST REDUCTION SURGERY    . CARPAL TUNNEL RELEASE     bilateral  . cervical disc inj    . CHOLECYSTECTOMY    . ENDOMETRIAL ABLATION W/ NOVASURE    . MELANOMA EXCISION    . NASAL SINUS SURGERY    . THYMECTOMY     due to myastenia gravis  . THYMECTOMY      There were no vitals filed for this visit.       Subjective Assessment - 01/03/17 1242    Subjective Pt arriving to therapy reporting pain in her neck that has been ongoing for several years, but has progressively gotten worse. Pt reported difficutly sleeping and stiffness noted in the morning and evenings. Pt also reporting pain in her low back which is chronic.    Pertinent History Myasthenia Gravis,  Fibromyalgia, DDD, recent history of hospitalization for 7 days due to Staff infection and fluid around her lungs   How long can you sit comfortably? 5 minutes   How long can you walk comfortably? 10 minutes   Currently in Pain? Yes   Pain Score 8    Pain Location Neck   Pain Orientation Mid   Pain Descriptors / Indicators Sore;Constant;Aching;Tightness   Pain Type Chronic pain   Pain Radiating Towards bilateral Upper traps and shoulders   Pain Onset More than a month ago   Pain Frequency Constant   Aggravating Factors  sleeping, lifting, moving certain ways   Pain Relieving Factors pain meds, heat   Effect of Pain on Daily Activities limits ADL's            [facility] PT Assessment - 01/03/17 0001      ROM / Strength   AROM / PROM / Strength AROM;Strength     AROM   AROM Assessment Site Cervical   Cervical Flexion 22   Cervical Extension 25   Cervical - Right Side Bend 12   Cervical - Left Side Bend 14   Cervical - Right Rotation 40   Cervical - Left Rotation 45  Strength   Overall Strength Comments Pt grossly 3/5 in bialteral shoulder, limited by pain     Palpation   Palpation comment pt with tendersness noted over C4-7 and paraspainls. Pt with trigger points noted in bilateral Upper traps and medial scapular border R>/L     Ambulation/Gait   Gait Comments Pt amb with step through gait pattern with limited cervical rotation and arm swing            Objective measurements completed on examination: See above findings.          [facility] Adult PT Treatment/Exercise - 01/03/17 0001      Modalities   Modalities Electrical Stimulation;Moist Heat     Moist Heat Therapy   Number Minutes Moist Heat 15 Minutes   Moist Heat Location Cervical     Electrical Stimulation   Electrical Stimulation Location cervical   Electrical Stimulation Action IFC   Electrical Stimulation Parameters 80-150 Hz, intensity to tolerance, x 15 minutes   Electrical Stimulation Goals  Tone;Pain     Manual Therapy   Manual Therapy Soft tissue mobilization   Soft tissue mobilization cervical paraspinals, bilateral UT and medial scapular border                PT Education - 01/03/17 1247    Education provided Yes   Education Details HEP and posture correction, basic anatomy of cervical spine   Person(s) Educated Patient   Methods Explanation;Demonstration;Handout;Verbal cues   Comprehension Verbalized understanding;Returned demonstration;Verbal cues required             PT Long Term Goals - 01/03/17 1300      PT LONG TERM GOAL #1   Title Pt will be independent in her HEP   Time 8   Period Weeks   Status New   Target Date 02/21/17     PT LONG TERM GOAL #2   Title Pt will improve her FOTO from 49% limitation to </= 39% limitation.    Time 8   Period Weeks   Status New   Target Date 02/21/17     PT LONG TERM GOAL #3   Title Pt will be able to report peforming ADL's with pain less than 3/10.    Time 8   Period Weeks   Status New   Target Date 02/21/17     PT LONG TERM GOAL #4   Title pt will improve bilateral cervical rotation to >/= 60 degrees.    Time 8   Period Weeks   Status New   Target Date 02/21/17                Plan - 01/03/17 1248    Clinical Impression Statement Pt presenting today as a moderate complexity evaluation complaining of cervical and lumbar back pain. Pt agreeing to concentrate on cervical pain since that is what is causing the most pain at present, presenting with 8/10 pain upon arrival. Pt with limited bilateral cervical rotation and shoulder flexion. Pt with cervical kyphosis with pain to palpation along the C4-7. Trigger points also noted in bilateral Upper Traps and along medial scapular border more on the R when compared to the left. Pt with decreased strength in bilateral UE's due to pain in her cervical spine. We discussed dry needling as a possible intervention and pt in agreement.  Skilled PT needed to  address pt's impairments with the below interventions.    History and Personal Factors relevant to plan of care: Myasthenia Gravis, recent 7  day hospital admission for [facility] removal infection with fluid build up on her lungs, fibromyalgia, blind in left eye, CT surgery bilaterally, breast reduction, gall bladder surgery, thyroidectomy, DDD   Clinical Presentation Evolving   Clinical Presentation due to: multiple site pain, multiple co-morbidities, radiating cervical pain   Clinical Decision Making Moderate   Rehab Potential Good   Clinical Impairments Affecting Rehab Potential Due to high co-pay of $40, pt may only be able to come to therapy once a week initially and then once every other week. Frequency altered to fit pt's financial restraints to once a week.    PT Frequency 1x / week   PT Duration 8 weeks   PT Treatment/Interventions ADLs/Self Care Home Management;Dry needling;Taping;Therapeutic activities;Therapeutic exercise;Functional mobility training;Patient/family education;Manual techniques;Passive range of motion;Moist Heat;Electrical Stimulation;Neuromuscular re-education   PT Next Visit Plan Discuss dry needling for next visit, cervical ROM, stretching, modalities as needed   PT Home Exercise Plan cervical rotation, cervical retraction   Consulted and Agree with Plan of Care Patient      Patient will benefit from skilled therapeutic intervention in order to improve the following deficits and impairments:  Pain, Postural dysfunction, Decreased range of motion, Difficulty walking, Decreased mobility, Decreased activity tolerance, Decreased strength  Visit Diagnosis: Cervicalgia  Muscle weakness (generalized)     Problem List Patient Active Problem List   Diagnosis Date Noted  . RLS (restless legs syndrome) 12/26/2016  . B12 deficiency 12/26/2016  . Vitamin D deficiency, unspecified 12/26/2016  . GERD (gastroesophageal reflux disease) 12/26/2016  . Anxiety disorder  12/26/2016  . Hyponatremia 10/24/2016  . AKI (acute kidney injury) ([place]) 10/23/2016  . Hypomagnesemia 10/22/2016  . Hypophosphatemia 10/22/2016  . Hypokalemia 10/21/2016  . Wound infection after surgery 10/21/2016  . Sepsis ([place]) 10/21/2016  . Myasthenia gravis ([place]) 10/21/2016  . Melanoma ([place]) 10/21/2016  . Crohn disease ([place]) 10/21/2016  . Cellulitis 10/21/2016  . Palpitations 08/25/2013  . Leukocytosis 05/25/2011  . Hyperlipidemia 04/20/2007  . Essential hypertension 04/20/2007  . Asthma 04/20/2007  . CARPAL TUNNEL SYNDROME, HX OF 04/20/2007    Oretha Caprice, MPT 01/03/2017, 2:23 PM  [facility] [place], [name], [address] Phone: [phone]   Fax:  [phone]  Name: Teresa Lee MRN: [id number] Date of Birth: 1960-11-13

## 2017-01-04 ENCOUNTER — Telehealth: Payer: Self-pay | Admitting: Family Medicine

## 2017-01-04 NOTE — Telephone Encounter (Signed)
Pt returning your call concerning lab results.  Pt states ok to leave message on personalized VM  Pt aware dr out of office today

## 2017-01-05 ENCOUNTER — Other Ambulatory Visit: Payer: Self-pay

## 2017-01-05 MED ORDER — ROPINIROLE HCL 1 MG PO TABS: 1.0000 mg | ORAL_TABLET | Freq: Every day | ORAL | 1 refills | Status: DC

## 2017-01-05 MED ORDER — SIMVASTATIN 20 MG PO TABS: 20.0000 mg | ORAL_TABLET | Freq: Every day | ORAL | 3 refills | Status: DC

## 2017-01-05 NOTE — Telephone Encounter (Signed)
Informed patient of results and patient verbalized understanding.  

## 2017-01-09 ENCOUNTER — Ambulatory Visit: Payer: Medicare HMO | Admitting: Physical Therapy

## 2017-01-09 DIAGNOSIS — M542 Cervicalgia: Secondary | ICD-10-CM | POA: Diagnosis not present

## 2017-01-09 DIAGNOSIS — M6281 Muscle weakness (generalized): Secondary | ICD-10-CM

## 2017-01-09 NOTE — Patient Instructions (Signed)
[facility]  DRY NEEDLING CONSENT FORM   Trigger point dry needling is a physical therapy approach to treat Myofascial Pain and Dysfunction.  Dry Needling (DN) is a valuable and effective way to deactivate myofascial trigger points (muscle knots/pain). It is skilled intervention that uses a thin filiform needle to penetrate the skin and stimulate underlying myofascial trigger points, muscular, and connective tissues for the management of neuromusculoskeletal pain and movement impairments.  A local twitch response (LTR) will be elicited.  This can sometimes feel like a deep ache in the muscle during the procedure. Multiple trigger points in multiple muscles can be treated during each treatment.  No medication of any kind is injected.   As with any medical treatment and procedure, there are possible adverse events.  While significant adverse events are uncommon, they do sometimes occur and must be considered prior to giving consent.  1. Dry needling often causes a "post needling soreness".  There can be an increase in pain from a couple of hours to 2-3 days, followed by an improvement in the overall pain state. 2. Any time a needle is used there is a risk of infection.  However, we are using new, sterile, and disposable needles; infections are extremely rare. 3. There is a possibility that you may bleed or bruise.  You may feel tired and some nausea following treatment. 4. There is a rare possibility of a pneumothorax (air in the chest cavity). 5. Allergic reaction to nickel in the stainless steel needle. 6. If a nerve is touched, it may cause paresthesia (a prickling/shock sensation) which is usually brief, but may continue for a couple of days.  Following treatment stay hydrated.  Continue regular activities but not too vigorous initially after treatment for 24-48 hours.  Dry Needling is best when combined with other physical therapy interventions such as  strengthening, stretching and other therapeutic modalities.   PLEASE ANSWER THE FOLLOWING QUESTIONS:  Do you have a lack of sensation?   Y/N  Do you have a phobia or fear of needles  Y/N  Are you pregnant?    Y/N If yes:  How many weeks? __________ Do you have any implanted devices?  Y/N If yes:  Pacemaker/Spinal Cord Stimulator/Deep Brain Stimulator/Insulin Pump/Other: ________________ Do you have any implants?  Y/N If yes: Breast/Facial/Pecs/Buttocks/Calves/Hip  Replacement/ Knee Replacement/Other: _________ Do you take any blood thinners?   Y/N If yes: Coumadin (Warfarin)/Other: ___________________ Do you have a bleeding disorder?   Y/N If yes: What kind: _________________________________ Do you take any immunosuppressants?  Y/N If yes:   What kind: _________________________________ Do you take anti-inflammatories?   Y/N If yes: What kind: Advil/Aspirin/Other: ________________ Have you ever been diagnosed with Scoliosis? Y/N Have you had back surgery?   Y/N If yes:  Laminectomy/Fusion/Other: ___________________   I have read, or had read to me, the above.  I have had the opportunity to ask any questions.  All of my questions have been answered to my satisfaction and I understand the risks involved with dry needling.  I consent to examination and treatment at [facility], including dry needling, of any and all of my involved and affected muscles.  

## 2017-01-09 NOTE — Therapy (Signed)
[facility] [place], [name], [address] Phone: [phone]   Fax:  [phone]  Physical Therapy Treatment  Patient Details  Name: Teresa Lee MRN: [id number] Date of Birth: 1960-04-01 No Data Recorded  Encounter Date: 01/09/2017      PT End of Session - 01/09/17 1322    Visit Number 2   Number of Visits 12   Authorization Type medicare, G-codes every 10th visit, Kx modifier after 15 visit   PT Start Time 1120   PT Stop Time 1212   PT Time Calculation (min) 52 min   Activity Tolerance Patient tolerated treatment well   Behavior During Therapy [facility] for tasks assessed/performed      Past Medical History:  Diagnosis Date  . Arthritis    osteoarthritis  . Asthma   . Blind left eye   . Cancer ([place])    skin  . Crohn's disease ([place])   . CTS (carpal tunnel syndrome)   . DJD (degenerative joint disease)    Both cervical spine and LS spine  . Fibromyalgia   . GERD (gastroesophageal reflux disease)   . Heart murmur   . Herniated nucleus pulposus   . Hyperlipidemia   . Hypertension   . IBS (irritable bowel syndrome)   . Leukocytosis   . Myasthenia gravis   . Myasthenia gravis [facility])     Past Surgical History:  Procedure Laterality Date  . BREAST REDUCTION SURGERY    . CARPAL TUNNEL RELEASE     bilateral  . cervical disc inj    . CHOLECYSTECTOMY    . ENDOMETRIAL ABLATION W/ NOVASURE    . MELANOMA EXCISION    . NASAL SINUS SURGERY    . THYMECTOMY     due to myastenia gravis  . THYMECTOMY      There were no vitals filed for this visit.      Subjective Assessment - 01/09/17 1342    Subjective No new complaints.   Pain Score 8    Pain Location Neck   Pain Orientation Mid   Pain Descriptors / Indicators Sore;Constant;Aching;Tightness   Pain Type Chronic pain   Pain Onset More than a month ago                         [facility] Adult PT Treatment/Exercise - 01/09/17 0001      Modalities   Modalities Electrical Stimulation     Moist Heat Therapy   Number Minutes Moist Heat 20 Minutes   Moist Heat Location Cervical     Electrical Stimulation   Electrical Stimulation Location --  Bilateral C-spine and upper thoracic.   Electrical Stimulation Action IFC at 80-150 Hz x 20 minutes.   Electrical Stimulation Goals Tone;Pain     Manual Therapy   Manual Therapy Soft tissue mobilization   Soft tissue mobilization Seated with head resting on pillow on table:  STW/M to bilateral upper traps and rhomboids including TP release technique to UT's and lev scaps bilaterally x 23 minutes.                     PT Long Term Goals - 01/03/17 1300      PT LONG TERM GOAL #1   Title Pt will be independent in her HEP   Time 8   Period Weeks   Status New   Target Date 02/21/17     PT LONG TERM GOAL #2   Title Pt  will improve her FOTO from 49% limitation to </= 39% limitation.    Time 8   Period Weeks   Status New   Target Date 02/21/17     PT LONG TERM GOAL #3   Title Pt will be able to report peforming ADL's with pain less than 3/10.    Time 8   Period Weeks   Status New   Target Date 02/21/17     PT LONG TERM GOAL #4   Title pt will improve bilateral cervical rotation to >/= 60 degrees.    Time 8   Period Weeks   Status New   Target Date 02/21/17               Plan - 01/09/17 1346    Clinical Impression Statement The patient tolerate dtreatment very well.  Provided patient with informed consent for dry needling as she is considering trying it.   Clinical Impairments Affecting Rehab Potential Due to high co-pay of $40, pt may only be able to come to therapy once a week initially and then once every other week. Frequency altered to fit pt's financial restraints to once a week.    PT Treatment/Interventions ADLs/Self Care Home Management;Dry needling;Taping;Therapeutic activities;Therapeutic exercise;Functional mobility training;Patient/family  education;Manual techniques;Passive range of motion;Moist Heat;Electrical Stimulation;Neuromuscular re-education   Consulted and Agree with Plan of Care Patient      Patient will benefit from skilled therapeutic intervention in order to improve the following deficits and impairments:  Pain, Postural dysfunction, Decreased range of motion, Difficulty walking, Decreased mobility, Decreased activity tolerance, Decreased strength  Visit Diagnosis: Cervicalgia  Muscle weakness (generalized)     Problem List Patient Active Problem List   Diagnosis Date Noted  . RLS (restless legs syndrome) 12/26/2016  . B12 deficiency 12/26/2016  . Vitamin D deficiency, unspecified 12/26/2016  . GERD (gastroesophageal reflux disease) 12/26/2016  . Anxiety disorder 12/26/2016  . Hyponatremia 10/24/2016  . AKI (acute kidney injury) ([place]) 10/23/2016  . Hypomagnesemia 10/22/2016  . Hypophosphatemia 10/22/2016  . Hypokalemia 10/21/2016  . Wound infection after surgery 10/21/2016  . Sepsis ([place]) 10/21/2016  . Myasthenia gravis ([place]) 10/21/2016  . Melanoma ([place]) 10/21/2016  . Crohn disease ([place]) 10/21/2016  . Cellulitis 10/21/2016  . Palpitations 08/25/2013  . Leukocytosis 05/25/2011  . Hyperlipidemia 04/20/2007  . Essential hypertension 04/20/2007  . Asthma 04/20/2007  . CARPAL TUNNEL SYNDROME, HX OF 04/20/2007    APPLEGATE, [place] MPT 01/09/2017, 1:48 PM  [facility] [address] [place], [name], [address] Phone: [phone]   Fax:  [phone]  Name: Teresa Lee MRN: [id number] Date of Birth: 31-Oct-1960

## 2017-01-10 DIAGNOSIS — Z79899 Other long term (current) drug therapy: Secondary | ICD-10-CM | POA: Diagnosis not present

## 2017-01-10 DIAGNOSIS — M5417 Radiculopathy, lumbosacral region: Secondary | ICD-10-CM | POA: Diagnosis not present

## 2017-01-10 DIAGNOSIS — M47816 Spondylosis without myelopathy or radiculopathy, lumbar region: Secondary | ICD-10-CM | POA: Diagnosis not present

## 2017-01-10 DIAGNOSIS — M5136 Other intervertebral disc degeneration, lumbar region: Secondary | ICD-10-CM | POA: Diagnosis not present

## 2017-01-10 DIAGNOSIS — M47817 Spondylosis without myelopathy or radiculopathy, lumbosacral region: Secondary | ICD-10-CM | POA: Diagnosis not present

## 2017-01-10 DIAGNOSIS — M542 Cervicalgia: Secondary | ICD-10-CM | POA: Diagnosis not present

## 2017-01-10 DIAGNOSIS — Z79891 Long term (current) use of opiate analgesic: Secondary | ICD-10-CM | POA: Diagnosis not present

## 2017-01-10 DIAGNOSIS — M47812 Spondylosis without myelopathy or radiculopathy, cervical region: Secondary | ICD-10-CM | POA: Diagnosis not present

## 2017-01-10 DIAGNOSIS — G894 Chronic pain syndrome: Secondary | ICD-10-CM | POA: Diagnosis not present

## 2017-01-15 ENCOUNTER — Ambulatory Visit (INDEPENDENT_AMBULATORY_CARE_PROVIDER_SITE_OTHER): Payer: Medicare HMO | Admitting: *Deleted

## 2017-01-15 DIAGNOSIS — Z23 Encounter for immunization: Secondary | ICD-10-CM | POA: Diagnosis not present

## 2017-01-15 DIAGNOSIS — E538 Deficiency of other specified B group vitamins: Secondary | ICD-10-CM

## 2017-01-15 MED ORDER — CYANOCOBALAMIN 1000 MCG/ML IJ SOLN
1000.0000 ug | Freq: Once | INTRAMUSCULAR | Status: AC
  Administered 2017-01-15: 1000 ug via INTRAMUSCULAR

## 2017-01-16 ENCOUNTER — Encounter: Payer: Medicare HMO | Admitting: Physical Therapy

## 2017-01-23 ENCOUNTER — Ambulatory Visit: Payer: Medicare HMO | Attending: Physician Assistant | Admitting: Physical Therapy

## 2017-01-23 ENCOUNTER — Encounter: Payer: Self-pay | Admitting: Physical Therapy

## 2017-01-23 DIAGNOSIS — M6281 Muscle weakness (generalized): Secondary | ICD-10-CM

## 2017-01-23 DIAGNOSIS — M542 Cervicalgia: Secondary | ICD-10-CM | POA: Diagnosis present

## 2017-01-23 NOTE — Therapy (Signed)
[facility] [place], [name], [address] Phone: [phone]   Fax:  [phone]  Physical Therapy Treatment  Patient Details  Name: Teresa Lee MRN: [id number] Date of Birth: 21-May-1960 No Data Recorded  Encounter Date: 01/23/2017  PT End of Session - 01/23/17 1149    Visit Number  3    Number of Visits  12    PT Start Time  1443 Delayed start time due to no room availability for dry needling.   Delayed start time due to no room availability for dry needling.   PT Stop Time  1114    PT Time Calculation (min)  32 min       Past Medical History:  Diagnosis Date  . Arthritis    osteoarthritis  . Asthma   . Blind left eye   . Cancer ([place])    skin  . Crohn's disease ([place])   . CTS (carpal tunnel syndrome)   . DJD (degenerative joint disease)    Both cervical spine and LS spine  . Fibromyalgia   . GERD (gastroesophageal reflux disease)   . Heart murmur   . Herniated nucleus pulposus   . Hyperlipidemia   . Hypertension   . IBS (irritable bowel syndrome)   . Leukocytosis   . Myasthenia gravis   . Myasthenia gravis [facility])     Past Surgical History:  Procedure Laterality Date  . BREAST REDUCTION SURGERY    . CARPAL TUNNEL RELEASE     bilateral  . cervical disc inj    . CHOLECYSTECTOMY    . ENDOMETRIAL ABLATION W/ NOVASURE    . MELANOMA EXCISION    . NASAL SINUS SURGERY    . THYMECTOMY     due to myastenia gravis  . THYMECTOMY      There were no vitals filed for this visit.  Subjective Assessment - 01/23/17 1150    Subjective  I'm looking forward to dry needling today.    Pain Score  7     Pain Location  Neck    Pain Orientation  Right    Pain Descriptors / Indicators  Sore    Pain Type  Chronic pain    Pain Onset  More than a month ago                      [facility] Adult PT Treatment/Exercise - 01/23/17 0001      Modalities   Modalities  Electrical Stimulation;Moist Heat      Moist Heat Therapy   Number Minutes Moist Heat  11 Minutes    Moist Heat Location  Cervical      Electrical Stimulation   Electrical Stimulation Location  -- RT UT.   RT UT.   Electrical Stimulation Action  -- Pre-mod.   Pre-mod.   Electrical Stimulation Parameters  -- 80-150 Hz (5 sec on and 5 sec off) x 11 minutes.   80-150 Hz (5 sec on and 5 sec off) x 11 minutes.   Electrical Stimulation Goals  Tone;Pain      Manual Therapy   Manual Therapy  Soft tissue mobilization    Soft tissue mobilization  STW/M to right UT x 8 minutes.       Trigger Point Dry Needling - 01/23/17 1152    Consent Given?  Yes    Education Handout Provided  Yes    Muscles Treated Upper Body  Upper trapezius Splenius cap/cerv and cervical multifidi.   Splenius  cap/cerv and cervical multifidi.   Upper Trapezius Response  Twitch reponse elicited                PT Long Term Goals - 01/03/17 1300      PT LONG TERM GOAL #1   Title  Pt will be independent in her HEP    Time  8    Period  Weeks    Status  New    Target Date  02/21/17      PT LONG TERM GOAL #2   Title  Pt will improve her FOTO from 49% limitation to </= 39% limitation.     Time  8    Period  Weeks    Status  New    Target Date  02/21/17      PT LONG TERM GOAL #3   Title  Pt will be able to report peforming ADL's with pain less than 3/10.     Time  8    Period  Weeks    Status  New    Target Date  02/21/17      PT LONG TERM GOAL #4   Title  pt will improve bilateral cervical rotation to >/= 60 degrees.     Time  8    Period  Weeks    Status  New    Target Date  02/21/17            Plan - 01/23/17 1153    Clinical Impression Statement  Excellent response to dry needling today with excellent twitch response.  Significant reduction in right UT tone and decreased pain after treatment.       Patient will benefit from skilled therapeutic intervention in order to improve the following deficits and impairments:   Pain, Postural dysfunction, Decreased range of motion, Difficulty walking, Decreased mobility, Decreased activity tolerance, Decreased strength  Visit Diagnosis: Cervicalgia  Muscle weakness (generalized)     Problem List Patient Active Problem List   Diagnosis Date Noted  . RLS (restless legs syndrome) 12/26/2016  . B12 deficiency 12/26/2016  . Vitamin D deficiency, unspecified 12/26/2016  . GERD (gastroesophageal reflux disease) 12/26/2016  . Anxiety disorder 12/26/2016  . Hyponatremia 10/24/2016  . AKI (acute kidney injury) ([place]) 10/23/2016  . Hypomagnesemia 10/22/2016  . Hypophosphatemia 10/22/2016  . Hypokalemia 10/21/2016  . Wound infection after surgery 10/21/2016  . Sepsis ([place]) 10/21/2016  . Myasthenia gravis ([place]) 10/21/2016  . Melanoma ([place]) 10/21/2016  . Crohn disease ([place]) 10/21/2016  . Cellulitis 10/21/2016  . Palpitations 08/25/2013  . Leukocytosis 05/25/2011  . Hyperlipidemia 04/20/2007  . Essential hypertension 04/20/2007  . Asthma 04/20/2007  . CARPAL TUNNEL SYNDROME, HX OF 04/20/2007    Leo Weyandt, [place] MPT 01/23/2017, 12:01 PM  [facility] [address] [place], [name], [address] Phone: [phone]   Fax:  [phone]  Name: Teresa Lee MRN: [id number] Date of Birth: [birth year]

## 2017-01-30 ENCOUNTER — Ambulatory Visit: Payer: Medicare HMO | Admitting: Physical Therapy

## 2017-01-30 DIAGNOSIS — M542 Cervicalgia: Secondary | ICD-10-CM

## 2017-01-30 DIAGNOSIS — M6281 Muscle weakness (generalized): Secondary | ICD-10-CM

## 2017-01-30 NOTE — Therapy (Signed)
[facility] [place], [name], [address] Phone: [phone]   Fax:  [phone]  Physical Therapy Treatment  Patient Details  Name: Teresa Lee MRN: [id number] Date of Birth: November 05, 1960 No Data Recorded  Encounter Date: 01/30/2017  PT End of Session - 01/30/17 1413    Visit Number  4    Number of Visits  12    PT Start Time  1030    PT Stop Time  1120    PT Time Calculation (min)  50 min    Activity Tolerance  Patient tolerated treatment well    Behavior During Therapy  [facility] for tasks assessed/performed       Past Medical History:  Diagnosis Date  . Arthritis    osteoarthritis  . Asthma   . Blind left eye   . Cancer ([place])    skin  . Crohn's disease ([place])   . CTS (carpal tunnel syndrome)   . DJD (degenerative joint disease)    Both cervical spine and LS spine  . Fibromyalgia   . GERD (gastroesophageal reflux disease)   . Heart murmur   . Herniated nucleus pulposus   . Hyperlipidemia   . Hypertension   . IBS (irritable bowel syndrome)   . Leukocytosis   . Myasthenia gravis   . Myasthenia gravis [facility])     Past Surgical History:  Procedure Laterality Date  . BREAST REDUCTION SURGERY    . CARPAL TUNNEL RELEASE     bilateral  . cervical disc inj    . CHOLECYSTECTOMY    . ENDOMETRIAL ABLATION W/ NOVASURE    . MELANOMA EXCISION    . NASAL SINUS SURGERY    . THYMECTOMY     due to myastenia gravis  . THYMECTOMY      There were no vitals filed for this visit.  Subjective Assessment - 01/30/17 1415    Subjective  The dry needling made me sore and then I felt much better.  Now i want the right side done.    Pain Score  6     Pain Location  Neck    Pain Orientation  Left    Pain Descriptors / Indicators  Tightness    Pain Type  Chronic pain    Pain Onset  More than a month ago                      [facility] Adult PT Treatment/Exercise - 01/30/17 0001      Moist Heat Therapy    Number Minutes Moist Heat  15 Minutes    Moist Heat Location  Cervical      Electrical Stimulation   Electrical Stimulation Location  Bil UT's.    Electrical Stimulation Action  IFC    Electrical Stimulation Parameters  80-150 Hz x 15 minutes.    Electrical Stimulation Goals  Tone;Pain      Manual Therapy   Manual Therapy  Soft tissue mobilization    Soft tissue mobilization  STW/M x 23 minutes to bilateral cervical paraspinal musculature.       Trigger Point Dry Needling - 01/30/17 1418    Consent Given?  Yes    Muscles Treated Upper Body  Upper trapezius Left cerv splenius/capitus and multifidi.    Upper Trapezius Response  Twitch reponse elicited                PT Long Term Goals - 01/03/17 1300      PT  LONG TERM GOAL #1   Title  Pt will be independent in her HEP    Time  8    Period  Weeks    Status  New    Target Date  02/21/17      PT LONG TERM GOAL #2   Title  Pt will improve her FOTO from 49% limitation to </= 39% limitation.     Time  8    Period  Weeks    Status  New    Target Date  02/21/17      PT LONG TERM GOAL #3   Title  Pt will be able to report peforming ADL's with pain less than 3/10.     Time  8    Period  Weeks    Status  New    Target Date  02/21/17      PT LONG TERM GOAL #4   Title  pt will improve bilateral cervical rotation to >/= 60 degrees.     Time  8    Period  Weeks    Status  New    Target Date  02/21/17            Plan - 01/30/17 1420    Clinical Impression Statement  Excellent response to treatment today with excellent twith response elicted.  Less pain reported after treatment.       Patient will benefit from skilled therapeutic intervention in order to improve the following deficits and impairments:  Pain, Postural dysfunction, Decreased range of motion, Difficulty walking, Decreased mobility, Decreased activity tolerance, Decreased strength  Visit Diagnosis: Cervicalgia  Muscle weakness  (generalized)     Problem List Patient Active Problem List   Diagnosis Date Noted  . RLS (restless legs syndrome) 12/26/2016  . B12 deficiency 12/26/2016  . Vitamin D deficiency, unspecified 12/26/2016  . GERD (gastroesophageal reflux disease) 12/26/2016  . Anxiety disorder 12/26/2016  . Hyponatremia 10/24/2016  . AKI (acute kidney injury) ([place]) 10/23/2016  . Hypomagnesemia 10/22/2016  . Hypophosphatemia 10/22/2016  . Hypokalemia 10/21/2016  . Wound infection after surgery 10/21/2016  . Sepsis ([place]) 10/21/2016  . Myasthenia gravis ([place]) 10/21/2016  . Melanoma ([place]) 10/21/2016  . Crohn disease ([place]) 10/21/2016  . Cellulitis 10/21/2016  . Palpitations 08/25/2013  . Leukocytosis 05/25/2011  . Hyperlipidemia 04/20/2007  . Essential hypertension 04/20/2007  . Asthma 04/20/2007  . CARPAL TUNNEL SYNDROME, HX OF 04/20/2007    Ifeoluwa Bartz, [place] MPT 01/30/2017, 2:22 PM  [facility] [address] [place], [name], [address] Phone: [phone]   Fax:  [phone]  Name: Teresa Lee MRN: [id number] Date of Birth: 02-Sep-1960

## 2017-02-06 ENCOUNTER — Encounter: Payer: Self-pay | Admitting: Physical Therapy

## 2017-02-06 ENCOUNTER — Ambulatory Visit: Payer: Medicare HMO | Admitting: Physical Therapy

## 2017-02-06 DIAGNOSIS — M6281 Muscle weakness (generalized): Secondary | ICD-10-CM

## 2017-02-06 DIAGNOSIS — M542 Cervicalgia: Secondary | ICD-10-CM

## 2017-02-06 NOTE — Therapy (Signed)
[facility] [place], [name], [address] Phone: [phone]   Fax:  [phone]  Physical Therapy Treatment  Patient Details  Name: Teresa Lee MRN: [id number] Date of Birth: 03-07-1961 No Data Recorded  Encounter Date: 02/06/2017  PT End of Session - 02/06/17 1355    Visit Number  5    Number of Visits  12    Authorization Type  medicare, G-codes every 10th visit, Kx modifier after 15 visit    PT Start Time  1032    PT Stop Time  1125    PT Time Calculation (min)  53 min       Past Medical History:  Diagnosis Date  . Arthritis    osteoarthritis  . Asthma   . Blind left eye   . Cancer ([place])    skin  . Crohn's disease ([place])   . CTS (carpal tunnel syndrome)   . DJD (degenerative joint disease)    Both cervical spine and LS spine  . Fibromyalgia   . GERD (gastroesophageal reflux disease)   . Heart murmur   . Herniated nucleus pulposus   . Hyperlipidemia   . Hypertension   . IBS (irritable bowel syndrome)   . Leukocytosis   . Myasthenia gravis   . Myasthenia gravis [facility])     Past Surgical History:  Procedure Laterality Date  . BREAST REDUCTION SURGERY    . CARPAL TUNNEL RELEASE     bilateral  . cervical disc inj    . CHOLECYSTECTOMY    . ENDOMETRIAL ABLATION W/ NOVASURE    . MELANOMA EXCISION    . NASAL SINUS SURGERY    . THYMECTOMY     due to myastenia gravis  . THYMECTOMY      There were no vitals filed for this visit.  Subjective Assessment - 02/06/17 1350    Subjective  The dry needling has helped a lot.    Pain Score  4     Pain Location  Neck    Pain Orientation  Right    Pain Onset  More than a month ago    Pain Frequency  Constant      Treatment:  DN to right UT/Levator Scapu with excellent twitch response f/b STW/M to bilateral C-spine musculature x 23 minutes f/b HMP and IFC x 20 mins.  Patient felt good after treatment..                                PT Long Term Goals - 01/03/17 1300      PT LONG TERM GOAL #1   Title  Pt will be independent in her HEP    Time  8    Period  Weeks    Status  New    Target Date  02/21/17      PT LONG TERM GOAL #2   Title  Pt will improve her FOTO from 49% limitation to </= 39% limitation.     Time  8    Period  Weeks    Status  New    Target Date  02/21/17      PT LONG TERM GOAL #3   Title  Pt will be able to report peforming ADL's with pain less than 3/10.     Time  8    Period  Weeks    Status  New    Target Date  02/21/17  PT LONG TERM GOAL #4   Title  pt will improve bilateral cervical rotation to >/= 60 degrees.     Time  8    Period  Weeks    Status  New    Target Date  02/21/17              Patient will benefit from skilled therapeutic intervention in order to improve the following deficits and impairments:     Visit Diagnosis: Cervicalgia  Muscle weakness (generalized)     Problem List Patient Active Problem List   Diagnosis Date Noted  . RLS (restless legs syndrome) 12/26/2016  . B12 deficiency 12/26/2016  . Vitamin D deficiency, unspecified 12/26/2016  . GERD (gastroesophageal reflux disease) 12/26/2016  . Anxiety disorder 12/26/2016  . Hyponatremia 10/24/2016  . AKI (acute kidney injury) ([place]) 10/23/2016  . Hypomagnesemia 10/22/2016  . Hypophosphatemia 10/22/2016  . Hypokalemia 10/21/2016  . Wound infection after surgery 10/21/2016  . Sepsis ([place]) 10/21/2016  . Myasthenia gravis ([place]) 10/21/2016  . Melanoma ([place]) 10/21/2016  . Crohn disease ([place]) 10/21/2016  . Cellulitis 10/21/2016  . Palpitations 08/25/2013  . Leukocytosis 05/25/2011  . Hyperlipidemia 04/20/2007  . Essential hypertension 04/20/2007  . Asthma 04/20/2007  . CARPAL TUNNEL SYNDROME, HX OF 04/20/2007    Loucinda Croy, [place]  MPT 02/06/2017, 3:20 PM  [facility] [place], [name], [address] Phone: [phone]   Fax:  [phone]  Name: Teresa Lee MRN: [id number] Date of Birth: 1960-09-23

## 2017-02-07 DIAGNOSIS — M47816 Spondylosis without myelopathy or radiculopathy, lumbar region: Secondary | ICD-10-CM | POA: Diagnosis not present

## 2017-02-07 DIAGNOSIS — M542 Cervicalgia: Secondary | ICD-10-CM | POA: Diagnosis not present

## 2017-02-07 DIAGNOSIS — G894 Chronic pain syndrome: Secondary | ICD-10-CM | POA: Diagnosis not present

## 2017-02-07 DIAGNOSIS — M47812 Spondylosis without myelopathy or radiculopathy, cervical region: Secondary | ICD-10-CM | POA: Diagnosis not present

## 2017-02-12 DIAGNOSIS — M5136 Other intervertebral disc degeneration, lumbar region: Secondary | ICD-10-CM | POA: Diagnosis not present

## 2017-02-12 DIAGNOSIS — M47817 Spondylosis without myelopathy or radiculopathy, lumbosacral region: Secondary | ICD-10-CM | POA: Diagnosis not present

## 2017-02-12 DIAGNOSIS — M5417 Radiculopathy, lumbosacral region: Secondary | ICD-10-CM | POA: Diagnosis not present

## 2017-02-15 ENCOUNTER — Ambulatory Visit: Payer: Medicare HMO | Admitting: Physical Therapy

## 2017-02-15 ENCOUNTER — Encounter: Payer: Self-pay | Admitting: Physical Therapy

## 2017-02-15 DIAGNOSIS — M6281 Muscle weakness (generalized): Secondary | ICD-10-CM

## 2017-02-15 DIAGNOSIS — M542 Cervicalgia: Secondary | ICD-10-CM

## 2017-02-15 NOTE — Therapy (Signed)
[facility] [place], [name], [address] Phone: [phone]   Fax:  [phone]  Physical Therapy Treatment  Patient Details  Name: Teresa Lee MRN: [id number] Date of Birth: [birth year] No Data Recorded  Encounter Date: 02/15/2017  PT End of Session - 02/15/17 1613    Visit Number  6    Number of Visits  12    Authorization Type  medicare, G-codes every 10th visit, Kx modifier after 15 visit    PT Start Time  1030    PT Stop Time  1120    PT Time Calculation (min)  50 min    Activity Tolerance  Patient tolerated treatment well    Behavior During Therapy  [facility] for tasks assessed/performed       Past Medical History:  Diagnosis Date  . Arthritis    osteoarthritis  . Asthma   . Blind left eye   . Cancer ([place])    skin  . Crohn's disease ([place])   . CTS (carpal tunnel syndrome)   . DJD (degenerative joint disease)    Both cervical spine and LS spine  . Fibromyalgia   . GERD (gastroesophageal reflux disease)   . Heart murmur   . Herniated nucleus pulposus   . Hyperlipidemia   . Hypertension   . IBS (irritable bowel syndrome)   . Leukocytosis   . Myasthenia gravis   . Myasthenia gravis [facility])     Past Surgical History:  Procedure Laterality Date  . BREAST REDUCTION SURGERY    . CARPAL TUNNEL RELEASE     bilateral  . cervical disc inj    . CHOLECYSTECTOMY    . ENDOMETRIAL ABLATION W/ NOVASURE    . MELANOMA EXCISION    . NASAL SINUS SURGERY    . THYMECTOMY     due to myastenia gravis  . THYMECTOMY      There were no vitals filed for this visit.  Subjective Assessment - 02/15/17 1609    Subjective  The left side of my neck hurts today.  I lie on my left side and view my Ipad.  The patient and I discussed poor again and she must view screen that are at eye level with correct head posture. We then went discussed chin tucks and extension to be performed throughout the day.    Pertinent History   Myasthenia Gravis, Fibromyalgia, DDD, recent history of hospitalization for 7 days due to Staff infection and fluid around her lungs    Pain Score  4     Pain Location  Neck    Pain Orientation  Left    Pain Descriptors / Indicators  Tightness                                   PT Long Term Goals - 01/03/17 1300      PT LONG TERM GOAL #1   Title  Pt will be independent in her HEP    Time  8    Period  Weeks    Status  New    Target Date  02/21/17      PT LONG TERM GOAL #2   Title  Pt will improve her FOTO from 49% limitation to </= 39% limitation.     Time  8    Period  Weeks    Status  New    Target Date  02/21/17  PT LONG TERM GOAL #3   Title  Pt will be able to report peforming ADL's with pain less than 3/10.     Time  8    Period  Weeks    Status  New    Target Date  02/21/17      PT LONG TERM GOAL #4   Title  pt will improve bilateral cervical rotation to >/= 60 degrees.     Time  8    Period  Weeks    Status  New    Target Date  02/21/17            Plan - 02/15/17 1614    Clinical Impression Statement  Good respone to treatment today.  Patient said she will be more aware of posture.       Patient will benefit from skilled therapeutic intervention in order to improve the following deficits and impairments:  Pain, Postural dysfunction, Decreased range of motion, Difficulty walking, Decreased mobility, Decreased activity tolerance, Decreased strength  Visit Diagnosis: Cervicalgia  Muscle weakness (generalized)     Problem List Patient Active Problem List   Diagnosis Date Noted  . RLS (restless legs syndrome) 12/26/2016  . B12 deficiency 12/26/2016  . Vitamin D deficiency, unspecified 12/26/2016  . GERD (gastroesophageal reflux disease) 12/26/2016  . Anxiety disorder 12/26/2016  . Hyponatremia 10/24/2016  . AKI (acute kidney injury) ([place]) 10/23/2016  . Hypomagnesemia 10/22/2016  . Hypophosphatemia 10/22/2016  .  Hypokalemia 10/21/2016  . Wound infection after surgery 10/21/2016  . Sepsis ([place]) 10/21/2016  . Myasthenia gravis ([place]) 10/21/2016  . Melanoma ([place]) 10/21/2016  . Crohn disease ([place]) 10/21/2016  . Cellulitis 10/21/2016  . Palpitations 08/25/2013  . Leukocytosis 05/25/2011  . Hyperlipidemia 04/20/2007  . Essential hypertension 04/20/2007  . Asthma 04/20/2007  . CARPAL TUNNEL SYNDROME, HX OF 04/20/2007   Treatment:  U/S at 1.50 W/CM2 x 12 minutes over left UT f/b STW/M x 11 minutes including TP release technique f/b Pre-mod e'stim with HMP x 20 minutes.  Patient felt better after treatment.  Auron Tadros, [place] MPT 02/15/2017, 4:19 PM  [facility] [address] [place], [name], [address] Phone: [phone]   Fax:  [phone]  Name: Teresa Lee MRN: [id number] Date of Birth: [birth year]

## 2017-02-20 ENCOUNTER — Encounter: Payer: Self-pay | Admitting: Physical Therapy

## 2017-02-20 ENCOUNTER — Ambulatory Visit: Payer: Medicare HMO | Attending: Physician Assistant | Admitting: Physical Therapy

## 2017-02-20 DIAGNOSIS — M6281 Muscle weakness (generalized): Secondary | ICD-10-CM | POA: Diagnosis present

## 2017-02-20 DIAGNOSIS — M542 Cervicalgia: Secondary | ICD-10-CM | POA: Diagnosis not present

## 2017-02-20 NOTE — Therapy (Signed)
[facility] [place], [name], [address] Phone: [phone]   Fax:  [phone]  Physical Therapy Treatment  Patient Details  Name: Teresa Lee MRN: [id number] Date of Birth: 23-Aug-1960 No Data Recorded  Encounter Date: 02/20/2017  PT End of Session - 02/20/17 1246    Visit Number  7    Number of Visits  12    Date for PT Re-Evaluation  02/28/17    Authorization Type  medicare, G-codes every 10th visit, Kx modifier after 15 visit    PT Start Time  1032    PT Stop Time  1123    PT Time Calculation (min)  51 min    Activity Tolerance  Patient tolerated treatment well    Behavior During Therapy  [facility] for tasks assessed/performed       Past Medical History:  Diagnosis Date  . Arthritis    osteoarthritis  . Asthma   . Blind left eye   . Cancer ([place])    skin  . Crohn's disease ([place])   . CTS (carpal tunnel syndrome)   . DJD (degenerative joint disease)    Both cervical spine and LS spine  . Fibromyalgia   . GERD (gastroesophageal reflux disease)   . Heart murmur   . Herniated nucleus pulposus   . Hyperlipidemia   . Hypertension   . IBS (irritable bowel syndrome)   . Leukocytosis   . Myasthenia gravis   . Myasthenia gravis [facility])     Past Surgical History:  Procedure Laterality Date  . BREAST REDUCTION SURGERY    . CARPAL TUNNEL RELEASE     bilateral  . cervical disc inj    . CHOLECYSTECTOMY    . ENDOMETRIAL ABLATION W/ NOVASURE    . MELANOMA EXCISION    . NASAL SINUS SURGERY    . THYMECTOMY     due to myastenia gravis  . THYMECTOMY      There were no vitals filed for this visit.  Subjective Assessment - 02/20/17 1247    Subjective  These treatments are helping.  I would like to continue.  I'm paying attention to my posture more now.    Pain Score  4     Pain Location  Neck    Pain Orientation  Left    Pain Descriptors / Indicators  Tightness    Pain Type  Chronic pain                       [facility] Adult PT Treatment/Exercise - 02/20/17 0001      Modalities   Modalities  Ultrasound      Electrical Stimulation   Electrical Stimulation Location  Bil UT's    Electrical Stimulation Action  Pre-mod (4 electrode).    Electrical Stimulation Parameters  80-150 Hz x 20 minutes.    Electrical Stimulation Goals  Tone;Pain      Ultrasound   Ultrasound Location  -- Bil. UT's.    Ultrasound Parameters  Combo e'stim/U/S at 1.50 W/CM2 7 minutes each UT (14 minutes total).      Manual Therapy   Manual Therapy  Soft tissue mobilization    Soft tissue mobilization  STW/M to bilateral UT's with TP release technique.x 10 minutes.                  PT Long Term Goals - 01/03/17 1300      PT LONG TERM GOAL #1   Title  Pt  will be independent in her HEP    Time  8    Period  Weeks    Status  New    Target Date  02/21/17      PT LONG TERM GOAL #2   Title  Pt will improve her FOTO from 49% limitation to </= 39% limitation.     Time  8    Period  Weeks    Status  New    Target Date  02/21/17      PT LONG TERM GOAL #3   Title  Pt will be able to report peforming ADL's with pain less than 3/10.     Time  8    Period  Weeks    Status  New    Target Date  02/21/17      PT LONG TERM GOAL #4   Title  pt will improve bilateral cervical rotation to >/= 60 degrees.     Time  8    Period  Weeks    Status  New    Target Date  02/21/17            Plan - 02/20/17 1251    Clinical Impression Statement  Another very good response to treatment with less pain after treatment.       Patient will benefit from skilled therapeutic intervention in order to improve the following deficits and impairments:     Visit Diagnosis: Cervicalgia  Muscle weakness (generalized)     Problem List Patient Active Problem List   Diagnosis Date Noted  . RLS (restless legs syndrome) 12/26/2016  . B12 deficiency 12/26/2016  . Vitamin D deficiency,  unspecified 12/26/2016  . GERD (gastroesophageal reflux disease) 12/26/2016  . Anxiety disorder 12/26/2016  . Hyponatremia 10/24/2016  . AKI (acute kidney injury) ([place]) 10/23/2016  . Hypomagnesemia 10/22/2016  . Hypophosphatemia 10/22/2016  . Hypokalemia 10/21/2016  . Wound infection after surgery 10/21/2016  . Sepsis ([place]) 10/21/2016  . Myasthenia gravis ([place]) 10/21/2016  . Melanoma ([place]) 10/21/2016  . Crohn disease ([place]) 10/21/2016  . Cellulitis 10/21/2016  . Palpitations 08/25/2013  . Leukocytosis 05/25/2011  . Hyperlipidemia 04/20/2007  . Essential hypertension 04/20/2007  . Asthma 04/20/2007  . CARPAL TUNNEL SYNDROME, HX OF 04/20/2007    [place], [place]  MPT 02/20/2017, 12:55 PM  [facility] [address] [place], [name], [address] Phone: [phone]   Fax:  [phone]  Name: Teresa Lee MRN: [id number] Date of Birth: 29-Aug-1960

## 2017-02-28 DIAGNOSIS — G4700 Insomnia, unspecified: Secondary | ICD-10-CM | POA: Diagnosis not present

## 2017-02-28 DIAGNOSIS — G7000 Myasthenia gravis without (acute) exacerbation: Secondary | ICD-10-CM | POA: Diagnosis not present

## 2017-02-28 DIAGNOSIS — G479 Sleep disorder, unspecified: Secondary | ICD-10-CM | POA: Diagnosis not present

## 2017-02-28 DIAGNOSIS — Z5181 Encounter for therapeutic drug level monitoring: Secondary | ICD-10-CM | POA: Diagnosis not present

## 2017-03-05 DIAGNOSIS — F413 Other mixed anxiety disorders: Secondary | ICD-10-CM | POA: Diagnosis not present

## 2017-03-05 DIAGNOSIS — F341 Dysthymic disorder: Secondary | ICD-10-CM | POA: Diagnosis not present

## 2017-03-05 DIAGNOSIS — F4542 Pain disorder with related psychological factors: Secondary | ICD-10-CM | POA: Diagnosis not present

## 2017-03-05 DIAGNOSIS — M5417 Radiculopathy, lumbosacral region: Secondary | ICD-10-CM | POA: Diagnosis not present

## 2017-03-05 DIAGNOSIS — M5136 Other intervertebral disc degeneration, lumbar region: Secondary | ICD-10-CM | POA: Diagnosis not present

## 2017-03-05 DIAGNOSIS — M47897 Other spondylosis, lumbosacral region: Secondary | ICD-10-CM | POA: Diagnosis not present

## 2017-03-07 DIAGNOSIS — M47816 Spondylosis without myelopathy or radiculopathy, lumbar region: Secondary | ICD-10-CM | POA: Diagnosis not present

## 2017-03-07 DIAGNOSIS — Z79899 Other long term (current) drug therapy: Secondary | ICD-10-CM | POA: Diagnosis not present

## 2017-03-07 DIAGNOSIS — M47812 Spondylosis without myelopathy or radiculopathy, cervical region: Secondary | ICD-10-CM | POA: Diagnosis not present

## 2017-03-07 DIAGNOSIS — Z79891 Long term (current) use of opiate analgesic: Secondary | ICD-10-CM | POA: Diagnosis not present

## 2017-03-07 DIAGNOSIS — G894 Chronic pain syndrome: Secondary | ICD-10-CM | POA: Diagnosis not present

## 2017-03-20 HISTORY — PX: SPINAL CORD STIMULATOR INSERTION: SHX5378

## 2017-03-21 ENCOUNTER — Encounter: Payer: Self-pay | Admitting: Physical Therapy

## 2017-03-21 ENCOUNTER — Ambulatory Visit: Payer: Medicare HMO | Attending: Physician Assistant | Admitting: Physical Therapy

## 2017-03-21 DIAGNOSIS — M6281 Muscle weakness (generalized): Secondary | ICD-10-CM | POA: Diagnosis present

## 2017-03-21 DIAGNOSIS — M542 Cervicalgia: Secondary | ICD-10-CM | POA: Insufficient documentation

## 2017-03-21 NOTE — Therapy (Signed)
[facility] [place], [name], [address] Phone: [phone]   Fax:  [phone]  Physical Therapy Treatment  Patient Details  Name: Teresa Lee MRN: [id number] Date of Birth: [birth year] No Data Recorded  Encounter Date: 03/21/2017  PT End of Session - 03/21/17 1359    Visit Number  8    Number of Visits  12    Date for PT Re-Evaluation  02/28/17    Authorization Type  medicare, G-codes every 10th visit, Kx modifier after 15 visit    PT Start Time  7824    PT Stop Time  1404    PT Time Calculation (min)  47 min    Activity Tolerance  Patient tolerated treatment well    Behavior During Therapy  [facility] for tasks assessed/performed       Past Medical History:  Diagnosis Date  . Arthritis    osteoarthritis  . Asthma   . Blind left eye   . Cancer ([place])    skin  . Crohn's disease ([place])   . CTS (carpal tunnel syndrome)   . DJD (degenerative joint disease)    Both cervical spine and LS spine  . Fibromyalgia   . GERD (gastroesophageal reflux disease)   . Heart murmur   . Herniated nucleus pulposus   . Hyperlipidemia   . Hypertension   . IBS (irritable bowel syndrome)   . Leukocytosis   . Myasthenia gravis   . Myasthenia gravis [facility])     Past Surgical History:  Procedure Laterality Date  . BREAST REDUCTION SURGERY    . CARPAL TUNNEL RELEASE     bilateral  . cervical disc inj    . CHOLECYSTECTOMY    . ENDOMETRIAL ABLATION W/ NOVASURE    . MELANOMA EXCISION    . NASAL SINUS SURGERY    . THYMECTOMY     due to myastenia gravis  . THYMECTOMY      There were no vitals filed for this visit.  Subjective Assessment - 03/21/17 1318    Subjective  Patient has new order but forgot and will bring next visit    Pertinent History  Myasthenia Gravis, Fibromyalgia, DDD, recent history of hospitalization for 7 days due to Staff infection and fluid around her lungs    How long can you sit comfortably?  5 minutes    How long can you walk comfortably?  10 minutes    Currently in Pain?  Yes    Pain Score  6     Pain Location  Neck    Pain Orientation  Left;Right    Pain Descriptors / Indicators  Discomfort    Pain Type  Chronic pain    Pain Onset  More than a month ago    Pain Frequency  Constant    Aggravating Factors   cervical rotation    Pain Relieving Factors  pain meds                      [facility] Adult PT Treatment/Exercise - 03/21/17 0001      Electrical Stimulation   Electrical Stimulation Location  Bil UT's    Electrical Stimulation Action  premod    Electrical Stimulation Parameters  80-150hz  x16min    Electrical Stimulation Goals  Tone;Pain      Ultrasound   Ultrasound Location  bilbil cervical paraspinals/UT    Ultrasound Parameters  1.5w/cm2/50%/71mhz x42min    Ultrasound Goals  Pain  Manual Therapy   Manual Therapy  Soft tissue mobilization    Soft tissue mobilization  maunual myofascial release/STW to bilateral paraspinals/right scap boarder/ UT's with TP release technique.                  PT Long Term Goals - 01/03/17 1300      PT LONG TERM GOAL #1   Title  Pt will be independent in her HEP    Time  8    Period  Weeks    Status  New    Target Date  02/21/17      PT LONG TERM GOAL #2   Title  Pt will improve her FOTO from 49% limitation to </= 39% limitation.     Time  8    Period  Weeks    Status  New    Target Date  02/21/17      PT LONG TERM GOAL #3   Title  Pt will be able to report peforming ADL's with pain less than 3/10.     Time  8    Period  Weeks    Status  New    Target Date  02/21/17      PT LONG TERM GOAL #4   Title  pt will improve bilateral cervical rotation to >/= 60 degrees.     Time  8    Period  Weeks    Status  New    Target Date  02/21/17            Plan - 03/21/17 1405    Clinical Impression Statement  Patient tolerated treatment well today. Patient has reported she has new order from MD yet  forgot it at home. Patient has reported more discomfort on right side of neck today. Patient had palpable TP and pain esp on right scap boarder and trap muscle. Patient current goals ongoing at this time due to pain deficts.     Rehab Potential  Good    PT Frequency  1x / week    PT Duration  8 weeks    PT Treatment/Interventions  ADLs/Self Care Home Management;Dry needling;Taping;Therapeutic activities;Therapeutic exercise;Functional mobility training;Patient/family education;Manual techniques;Passive range of motion;Moist Heat;Electrical Stimulation;Neuromuscular re-education    PT Next Visit Plan  cont with POC per MD/MPT    Consulted and Agree with Plan of Care  Patient       Patient will benefit from skilled therapeutic intervention in order to improve the following deficits and impairments:  Pain, Postural dysfunction, Decreased range of motion, Difficulty walking, Decreased mobility, Decreased activity tolerance, Decreased strength  Visit Diagnosis: Cervicalgia  Muscle weakness (generalized)     Problem List Patient Active Problem List   Diagnosis Date Noted  . RLS (restless legs syndrome) 12/26/2016  . B12 deficiency 12/26/2016  . Vitamin D deficiency, unspecified 12/26/2016  . GERD (gastroesophageal reflux disease) 12/26/2016  . Anxiety disorder 12/26/2016  . Hyponatremia 10/24/2016  . AKI (acute kidney injury) ([place]) 10/23/2016  . Hypomagnesemia 10/22/2016  . Hypophosphatemia 10/22/2016  . Hypokalemia 10/21/2016  . Wound infection after surgery 10/21/2016  . Sepsis ([place]) 10/21/2016  . Myasthenia gravis ([place]) 10/21/2016  . Melanoma ([place]) 10/21/2016  . Crohn disease ([place]) 10/21/2016  . Cellulitis 10/21/2016  . Palpitations 08/25/2013  . Leukocytosis 05/25/2011  . Hyperlipidemia 04/20/2007  . Essential hypertension 04/20/2007  . Asthma 04/20/2007  . CARPAL TUNNEL SYNDROME, HX OF 04/20/2007    Raju Coppolino P, PTA 03/21/2017, 2:08 PM   Outpatient  [facility] [place], [name], [address] Phone: [phone]   Fax:  [phone]  Name: Teresa Lee MRN: [id number] Date of Birth: Feb 10, 1961

## 2017-03-27 ENCOUNTER — Encounter: Payer: Medicare HMO | Admitting: Physical Therapy

## 2017-03-30 DIAGNOSIS — R351 Nocturia: Secondary | ICD-10-CM | POA: Diagnosis not present

## 2017-04-04 DIAGNOSIS — Z79899 Other long term (current) drug therapy: Secondary | ICD-10-CM | POA: Diagnosis not present

## 2017-04-04 DIAGNOSIS — Z79891 Long term (current) use of opiate analgesic: Secondary | ICD-10-CM | POA: Diagnosis not present

## 2017-04-04 DIAGNOSIS — M542 Cervicalgia: Secondary | ICD-10-CM | POA: Diagnosis not present

## 2017-04-04 DIAGNOSIS — G894 Chronic pain syndrome: Secondary | ICD-10-CM | POA: Diagnosis not present

## 2017-04-04 DIAGNOSIS — M47812 Spondylosis without myelopathy or radiculopathy, cervical region: Secondary | ICD-10-CM | POA: Diagnosis not present

## 2017-04-04 DIAGNOSIS — M47816 Spondylosis without myelopathy or radiculopathy, lumbar region: Secondary | ICD-10-CM | POA: Diagnosis not present

## 2017-04-05 ENCOUNTER — Ambulatory Visit: Payer: Medicare HMO | Admitting: *Deleted

## 2017-04-05 DIAGNOSIS — M542 Cervicalgia: Secondary | ICD-10-CM | POA: Diagnosis not present

## 2017-04-05 DIAGNOSIS — M6281 Muscle weakness (generalized): Secondary | ICD-10-CM

## 2017-04-05 NOTE — Therapy (Addendum)
[facility] [place], [name], [address] Phone: [phone]   Fax:  [phone]  Physical Therapy Treatment  Patient Details  Name: Teresa Lee MRN: [id number] Date of Birth: [birth year] No Data Recorded  Encounter Date: 04/05/2017  PT End of Session - 04/05/17 1757    Visit Number  9    Number of Visits  12    Date for PT Re-Evaluation  02/28/17    Authorization Type  medicare, G-codes every 10th visit, Kx modifier after 15 visit    PT Start Time  1600    PT Stop Time  1650    PT Time Calculation (min)  50 min       Past Medical History:  Diagnosis Date  . Arthritis    osteoarthritis  . Asthma   . Blind left eye   . Cancer ([place])    skin  . Crohn's disease ([place])   . CTS (carpal tunnel syndrome)   . DJD (degenerative joint disease)    Both cervical spine and LS spine  . Fibromyalgia   . GERD (gastroesophageal reflux disease)   . Heart murmur   . Herniated nucleus pulposus   . Hyperlipidemia   . Hypertension   . IBS (irritable bowel syndrome)   . Leukocytosis   . Myasthenia gravis   . Myasthenia gravis [facility])     Past Surgical History:  Procedure Laterality Date  . BREAST REDUCTION SURGERY    . CARPAL TUNNEL RELEASE     bilateral  . cervical disc inj    . CHOLECYSTECTOMY    . ENDOMETRIAL ABLATION W/ NOVASURE    . MELANOMA EXCISION    . NASAL SINUS SURGERY    . THYMECTOMY     due to myastenia gravis  . THYMECTOMY      There were no vitals filed for this visit.                   [facility] Adult PT Treatment/Exercise - 04/05/17 0001      Modalities   Modalities  Electrical Stimulation;Ultrasound;Moist Heat      Electrical Stimulation   Electrical Stimulation Location  Bil UT's  Premod 80-150hz  x 15 mins     Electrical Stimulation Goals  Tone;Pain      Ultrasound   Ultrasound Location  Bil cerv paras    Ultrasound Parameters  1.5 w/cm2 x 12 mins    Ultrasound Goals  Pain       Manual Therapy   Manual Therapy  Soft tissue mobilization    Soft tissue mobilization  maunual myofascial release/STW to bilateral paraspinals/right scap boarder/ UT's with TP release technique.                  PT Long Term Goals - 01/03/17 1300      PT LONG TERM GOAL #1   Title  Pt will be independent in her HEP    Time  8    Period  Weeks    Status  New    Target Date  02/21/17      PT LONG TERM GOAL #2   Title  Pt will improve her FOTO from 49% limitation to </= 39% limitation.     Time  8    Period  Weeks    Status  New    Target Date  02/21/17      PT LONG TERM GOAL #3   Title  Pt will be able to report  peforming ADL's with pain less than 3/10.     Time  8    Period  Weeks    Status  New    Target Date  02/21/17      PT LONG TERM GOAL #4   Title  pt will improve bilateral cervical rotation to >/= 60 degrees.     Time  8    Period  Weeks    Status  New    Target Date  02/21/17            Plan - 04/05/17 1803    Clinical Impression Statement  Pt arrived today doing fairly well. She brought in new order for 1xweek for 4 weeks. She  was very tight and sore along bil. Utraps  and cerv. paras. She did well with Rx and had decreased pain aftere. Normal response to modalities    Clinical Impairments Affecting Rehab Potential  Due to high co-pay of $40, pt may only be able to come to therapy once a week initially and then once every other week. Frequency altered to fit pt's financial restraints to once a week.     PT Frequency  1x / week    PT Duration  8 weeks    PT Treatment/Interventions  ADLs/Self Care Home Management;Dry needling;Taping;Therapeutic activities;Therapeutic exercise;Functional mobility training;Patient/family education;Manual techniques;Passive range of motion;Moist Heat;Electrical Stimulation;Neuromuscular re-education    PT Next Visit Plan  cont with POC per MD/MPT    PT Home Exercise Plan  cervical rotation, cervical retraction     Consulted and Agree with Plan of Care  Patient       Patient will benefit from skilled therapeutic intervention in order to improve the following deficits and impairments:  Pain, Postural dysfunction, Decreased range of motion, Difficulty walking, Decreased mobility, Decreased activity tolerance, Decreased strength  Visit Diagnosis: Cervicalgia  Muscle weakness (generalized)     Problem List Patient Active Problem List   Diagnosis Date Noted  . RLS (restless legs syndrome) 12/26/2016  . B12 deficiency 12/26/2016  . Vitamin D deficiency, unspecified 12/26/2016  . GERD (gastroesophageal reflux disease) 12/26/2016  . Anxiety disorder 12/26/2016  . Hyponatremia 10/24/2016  . AKI (acute kidney injury) ([place]) 10/23/2016  . Hypomagnesemia 10/22/2016  . Hypophosphatemia 10/22/2016  . Hypokalemia 10/21/2016  . Wound infection after surgery 10/21/2016  . Sepsis ([place]) 10/21/2016  . Myasthenia gravis ([place]) 10/21/2016  . Melanoma ([place]) 10/21/2016  . Crohn disease ([place]) 10/21/2016  . Cellulitis 10/21/2016  . Palpitations 08/25/2013  . Leukocytosis 05/25/2011  . Hyperlipidemia 04/20/2007  . Essential hypertension 04/20/2007  . Asthma 04/20/2007  . CARPAL TUNNEL SYNDROME, HX OF 04/20/2007    PHYSICAL THERAPY DISCHARGE SUMMARY  Visits from Start of Care: 8  Current functional level related to goals / functional outcomes: See above   Remaining deficits: See goals    Education / Equipment: HEP   Plan: Patient agrees to discharge.  Patient goals were not met. Patient is being discharged due to not returning since the last visit.  ?????       RAMSEUR,CHRIS, PTA 04/05/2017, 6:18 PM  [facility] [place], [name], [address] Phone: [phone]   Fax:  [phone]  Name: Teresa Lee MRN: [id number] Date of Birth: [birth year]

## 2017-04-06 ENCOUNTER — Other Ambulatory Visit: Payer: Self-pay | Admitting: *Deleted

## 2017-04-09 DIAGNOSIS — M5417 Radiculopathy, lumbosacral region: Secondary | ICD-10-CM | POA: Diagnosis not present

## 2017-04-09 DIAGNOSIS — G894 Chronic pain syndrome: Secondary | ICD-10-CM | POA: Diagnosis not present

## 2017-04-09 DIAGNOSIS — M5136 Other intervertebral disc degeneration, lumbar region: Secondary | ICD-10-CM | POA: Diagnosis not present

## 2017-04-09 DIAGNOSIS — M47817 Spondylosis without myelopathy or radiculopathy, lumbosacral region: Secondary | ICD-10-CM | POA: Diagnosis not present

## 2017-04-10 ENCOUNTER — Telehealth: Payer: Self-pay | Admitting: *Deleted

## 2017-04-10 DIAGNOSIS — D2271 Melanocytic nevi of right lower limb, including hip: Secondary | ICD-10-CM | POA: Diagnosis not present

## 2017-04-10 DIAGNOSIS — D485 Neoplasm of uncertain behavior of skin: Secondary | ICD-10-CM | POA: Diagnosis not present

## 2017-04-10 DIAGNOSIS — L821 Other seborrheic keratosis: Secondary | ICD-10-CM | POA: Diagnosis not present

## 2017-04-10 DIAGNOSIS — Z85820 Personal history of malignant melanoma of skin: Secondary | ICD-10-CM | POA: Diagnosis not present

## 2017-04-10 DIAGNOSIS — L718 Other rosacea: Secondary | ICD-10-CM | POA: Diagnosis not present

## 2017-04-10 DIAGNOSIS — D225 Melanocytic nevi of trunk: Secondary | ICD-10-CM | POA: Diagnosis not present

## 2017-04-10 DIAGNOSIS — D2362 Other benign neoplasm of skin of left upper limb, including shoulder: Secondary | ICD-10-CM | POA: Diagnosis not present

## 2017-04-10 DIAGNOSIS — D692 Other nonthrombocytopenic purpura: Secondary | ICD-10-CM | POA: Diagnosis not present

## 2017-04-10 NOTE — Telephone Encounter (Signed)
Requesting refill on triamterene-htcz 37.5-25 capsule [phone], fax: [phone]

## 2017-04-12 ENCOUNTER — Encounter: Payer: Medicare HMO | Admitting: *Deleted

## 2017-04-13 ENCOUNTER — Other Ambulatory Visit: Payer: Self-pay | Admitting: Family Medicine

## 2017-04-13 MED ORDER — TRIAMTERENE-HCTZ 37.5-25 MG PO CAPS: 1.0000 | ORAL_CAPSULE | ORAL | 0 refills | Status: DC

## 2017-04-13 NOTE — Telephone Encounter (Signed)
Rx sent to her pharmacy.  Thanks, BJ 

## 2017-04-19 ENCOUNTER — Telehealth: Payer: Self-pay | Admitting: *Deleted

## 2017-04-19 NOTE — Telephone Encounter (Signed)
Pharmacy requesting refill authorization for Losartan Potassium 50 mg 1 tab daily and Meloxicam 15 mg 1 tab daily as needed. Medication has not been ordered by Dr. Martinique previously

## 2017-04-20 ENCOUNTER — Other Ambulatory Visit: Payer: Self-pay | Admitting: Family Medicine

## 2017-04-20 MED ORDER — LOSARTAN POTASSIUM 50 MG PO TABS: 50.0000 mg | ORAL_TABLET | Freq: Every day | ORAL | 0 refills | Status: DC

## 2017-04-20 NOTE — Telephone Encounter (Signed)
Rx for losartan 50 mg daily was sent to her pharmacy.  Meloxicam was not discussed, I do not see it on her med list, we will need to address this next visit before this can be authorized.  Thanks, BJ

## 2017-04-20 NOTE — Telephone Encounter (Signed)
Spoke to pharmacist about refill requests, he stated that he would make a note and let patient know about rx that was unable to be refilled.

## 2017-04-25 ENCOUNTER — Telehealth: Payer: Self-pay | Admitting: *Deleted

## 2017-04-25 NOTE — Telephone Encounter (Signed)
Refill request for Citalopram 40 mg  #30

## 2017-04-26 DIAGNOSIS — G4709 Other insomnia: Secondary | ICD-10-CM | POA: Diagnosis not present

## 2017-04-27 ENCOUNTER — Telehealth: Payer: Self-pay | Admitting: *Deleted

## 2017-04-27 ENCOUNTER — Other Ambulatory Visit: Payer: Self-pay | Admitting: Family Medicine

## 2017-04-27 MED ORDER — CITALOPRAM HYDROBROMIDE 40 MG PO TABS: 40.0000 mg | ORAL_TABLET | Freq: Every day | ORAL | 0 refills | Status: DC

## 2017-04-27 NOTE — Telephone Encounter (Signed)
Rx for Citalopram sent to her pharmacy. I sent 90 tabs.  Thanks, BJ

## 2017-04-27 NOTE — Telephone Encounter (Signed)
We did discussed severe problems during her visit.We did not discuss pain in detail because she follows with pain management. So refill is not authorized. Thanks, BJ

## 2017-04-27 NOTE — Telephone Encounter (Signed)
Requesting refill on Meloxicam 15 mg 1 tab by mouth daily #30/1 Never prescribed by Dr. Martinique

## 2017-04-30 ENCOUNTER — Ambulatory Visit (INDEPENDENT_AMBULATORY_CARE_PROVIDER_SITE_OTHER): Payer: Medicare HMO | Admitting: Family Medicine

## 2017-04-30 ENCOUNTER — Encounter: Payer: Self-pay | Admitting: Family Medicine

## 2017-04-30 VITALS — BP 126/80 | HR 88 | Temp 98.5°F | Resp 12 | Ht 61.0 in | Wt 146.1 lb

## 2017-04-30 DIAGNOSIS — K219 Gastro-esophageal reflux disease without esophagitis: Secondary | ICD-10-CM | POA: Diagnosis not present

## 2017-04-30 DIAGNOSIS — L219 Seborrheic dermatitis, unspecified: Secondary | ICD-10-CM

## 2017-04-30 DIAGNOSIS — R251 Tremor, unspecified: Secondary | ICD-10-CM | POA: Diagnosis not present

## 2017-04-30 DIAGNOSIS — G2581 Restless legs syndrome: Secondary | ICD-10-CM | POA: Diagnosis not present

## 2017-04-30 DIAGNOSIS — E538 Deficiency of other specified B group vitamins: Secondary | ICD-10-CM

## 2017-04-30 DIAGNOSIS — D72829 Elevated white blood cell count, unspecified: Secondary | ICD-10-CM | POA: Diagnosis not present

## 2017-04-30 DIAGNOSIS — M797 Fibromyalgia: Secondary | ICD-10-CM

## 2017-04-30 DIAGNOSIS — E782 Mixed hyperlipidemia: Secondary | ICD-10-CM | POA: Diagnosis not present

## 2017-04-30 DIAGNOSIS — I10 Essential (primary) hypertension: Secondary | ICD-10-CM

## 2017-04-30 DIAGNOSIS — R69 Illness, unspecified: Secondary | ICD-10-CM | POA: Diagnosis not present

## 2017-04-30 DIAGNOSIS — F419 Anxiety disorder, unspecified: Secondary | ICD-10-CM | POA: Diagnosis not present

## 2017-04-30 DIAGNOSIS — E559 Vitamin D deficiency, unspecified: Secondary | ICD-10-CM | POA: Diagnosis not present

## 2017-04-30 LAB — CBC WITH DIFFERENTIAL/PLATELET
BASOS PCT: 0.3 %
Basophils Absolute: 50 cells/uL (ref 0–200)
EOS PCT: 1.3 %
Eosinophils Absolute: 216 cells/uL (ref 15–500)
HCT: 37.5 % (ref 35.0–45.0)
HEMOGLOBIN: 13 g/dL (ref 11.7–15.5)
LYMPHS ABS: 1710 {cells}/uL (ref 850–3900)
MCH: 33.6 pg — ABNORMAL HIGH (ref 27.0–33.0)
MCHC: 34.7 g/dL (ref 32.0–36.0)
MCV: 96.9 fL (ref 80.0–100.0)
MPV: 10.2 fL (ref 7.5–12.5)
Monocytes Relative: 6.6 %
Neutro Abs: 13529 cells/uL — ABNORMAL HIGH (ref 1500–7800)
Neutrophils Relative %: 81.5 %
Platelets: 277 10*3/uL (ref 140–400)
RBC: 3.87 10*6/uL (ref 3.80–5.10)
RDW: 12.8 % (ref 11.0–15.0)
Total Lymphocyte: 10.3 %
WBC: 16.6 10*3/uL — AB (ref 3.8–10.8)
WBCMIX: 1096 {cells}/uL — AB (ref 200–950)

## 2017-04-30 LAB — COMPREHENSIVE METABOLIC PANEL
ALBUMIN: 3.8 g/dL (ref 3.5–5.2)
ALT: 9 U/L (ref 0–35)
AST: 11 U/L (ref 0–37)
Alkaline Phosphatase: 41 U/L (ref 39–117)
BUN: 20 mg/dL (ref 6–23)
CALCIUM: 8.9 mg/dL (ref 8.4–10.5)
CHLORIDE: 99 meq/L (ref 96–112)
CO2: 25 meq/L (ref 19–32)
Creatinine, Ser: 0.72 mg/dL (ref 0.40–1.20)
GFR: 89.31 mL/min (ref >60.00)
Glucose, Bld: 90 mg/dL (ref 70–99)
Potassium: 3.4 mEq/L — ABNORMAL LOW (ref 3.5–5.1)
Sodium: 139 mEq/L (ref 135–145)
Total Bilirubin: 0.6 mg/dL (ref 0.2–1.2)
Total Protein: 6.8 g/dL (ref 6.0–8.3)

## 2017-04-30 LAB — LIPID PANEL
CHOL/HDL RATIO: 4
Cholesterol: 139 mg/dL (ref 0–200)
HDL: 34.4 mg/dL — ABNORMAL LOW (ref >39.00)
NONHDL: 105.02
Triglycerides: 354 mg/dL — ABNORMAL HIGH (ref 0.0–149.0)
VLDL: 70.8 mg/dL — ABNORMAL HIGH (ref 0.0–40.0)

## 2017-04-30 LAB — LDL CHOLESTEROL, DIRECT: LDL DIRECT: 52 mg/dL

## 2017-04-30 LAB — TSH: TSH: 3.38 u[IU]/mL (ref 0.35–4.50)

## 2017-04-30 LAB — VITAMIN D 25 HYDROXY (VIT D DEFICIENCY, FRACTURES): VITD: 23.21 ng/mL — ABNORMAL LOW (ref 30.00–100.00)

## 2017-04-30 MED ORDER — CYANOCOBALAMIN 1000 MCG/ML IJ SOLN
1000.0000 ug | Freq: Once | INTRAMUSCULAR | Status: AC
  Administered 2017-04-30: 1000 ug via INTRAMUSCULAR

## 2017-04-30 MED ORDER — PANTOPRAZOLE SODIUM 40 MG PO TBEC: 40.0000 mg | DELAYED_RELEASE_TABLET | Freq: Every day | ORAL | 3 refills | Status: DC

## 2017-04-30 MED ORDER — SIMVASTATIN 20 MG PO TABS: 20.0000 mg | ORAL_TABLET | Freq: Every day | ORAL | 3 refills | Status: DC

## 2017-04-30 MED ORDER — CITALOPRAM HYDROBROMIDE 40 MG PO TABS: 40.0000 mg | ORAL_TABLET | Freq: Every day | ORAL | 1 refills | Status: DC

## 2017-04-30 MED ORDER — MELOXICAM 15 MG PO TABS: 15.0000 mg | ORAL_TABLET | Freq: Every day | ORAL | 1 refills | Status: DC

## 2017-04-30 MED ORDER — LOSARTAN POTASSIUM 50 MG PO TABS: 50.0000 mg | ORAL_TABLET | Freq: Every day | ORAL | 2 refills | Status: DC

## 2017-04-30 MED ORDER — ROPINIROLE HCL 1 MG PO TABS: 1.0000 mg | ORAL_TABLET | Freq: Every day | ORAL | 2 refills | Status: DC

## 2017-04-30 MED ORDER — TRIAMTERENE-HCTZ 37.5-25 MG PO CAPS: 1.0000 | ORAL_CAPSULE | ORAL | 1 refills | Status: DC

## 2017-04-30 MED ORDER — KETOCONAZOLE 2 % EX CREA: 1.0000 "application " | TOPICAL_CREAM | Freq: Every day | CUTANEOUS | 1 refills | Status: DC

## 2017-04-30 NOTE — Progress Notes (Addendum)
HPI:   Teresa Lee is a 57 y.o. female, who is here today with her mother for 5 months follow up.   She was last seen on 12/26/16 for asthma exacerbation.  Asthma exacerbations are rare, usually once or twice per year and associated with acute respiratory illnesses.  Hypertension:   Currently on Cozaar 50 mg daily and triamterene- HCTZ 37.5-25 mg daily.  Last eye exam about a year ago.  She is taking medications as instructed, no side effects reported.  She has not noted unusual headache, visual changes, exertional chest pain, dyspnea,  focal weakness, or edema.   Lab Results  Component Value Date   CREATININE 0.66 10/28/2016   BUN 12 10/28/2016   NA 141 10/28/2016   K 3.6 10/28/2016   CL 103 10/28/2016   CO2 25 10/28/2016     Hyperlipidemia:  Currently on Simvastatin 20 mg daily. Following a low fat diet: Yes..  She has not noted side effects with medication.  Lab Results  Component Value Date   CHOL 158 12/26/2016   HDL 34.60 (L) 12/26/2016   LDLCALC 162 (H) 08/08/2013   LDLDIRECT 52.0 12/26/2016   TRIG (H) 12/26/2016    443.0 Triglyceride is over 400; calculations on Lipids are invalid.   CHOLHDL 5 12/26/2016   3-4 days of body aches. Hx of fibromyalgia and generalized OA but she feels like this pain is worse.  Daughter has been sick recently.  She is currently on Meloxicam 15 mg daily, she needs a refill. She is tolerating medication well and denies side effects.  Her mother mentions that she has had hand tremors,which she did not noted before but called to her attention by her husband about 2 weeks ago. She has trouble with putting puzzle yesterday because hand tremor. Sometimes she also has trouble with writing.  She has Hx of myasthenia gravis and follows with neurologist regularly.  Leukocytosis: 12/2016 WBC's elevated at 17.1.   She is also c/oa month facial rash ,pruritic. No new product or exposure. She has not tried OTC  medication. Rash is around nose.   GERD: Currently she is on Protonix 40 mg daily.  As far as she takes medication daily she has no symptoms.  History of hypokalemia, she has not been on K-Lor 20 mEq for almost a year, requesting refill.  She is no longer on Furosemide.   RLS, currently she is on Requip 1 mg 1-2 tablets daily as needed. Symptoms usually exacerbated when she is in bed or when she is seated with extended legs, like when she is in the movie theater. Medication helps with symptoms, she denies side effects.  Anxiety disorder, currently she is on Celexa 40 mg daily. She denies suicidal thoughts. Symptoms are otherwise stable.  + Insomnia,she has an appt for "sleep psychotherapy" at [facility] next week.   She also would like B12 check, reporting history of B12 deficiency. Vitamin D deficiency, currently she is on vitamin D 5000 units daily.   Review of Systems  Constitutional: Positive for fatigue. Negative for activity change, appetite change, fever and unexpected weight change.  HENT: Negative for mouth sores, nosebleeds and trouble swallowing.   Eyes: Negative for redness and visual disturbance.  Respiratory: Negative for cough, shortness of breath and wheezing.   Cardiovascular: Negative for chest pain, palpitations and leg swelling.  Gastrointestinal: Negative for abdominal pain, nausea and vomiting.       Negative for changes in bowel habits.  Endocrine: Negative  for cold intolerance, heat intolerance, polydipsia, polyphagia and polyuria.  Genitourinary: Negative for decreased urine volume, dysuria and hematuria.  Musculoskeletal: Positive for arthralgias and myalgias. Negative for gait problem.  Skin: Positive for rash. Negative for wound.  Allergic/Immunologic: Positive for environmental allergies.  Neurological: Positive for tremors. Negative for syncope, weakness and headaches.  Hematological: Negative for adenopathy. Does not bruise/bleed easily.    Psychiatric/Behavioral: Positive for sleep disturbance. Negative for confusion. The patient is nervous/anxious.       Current Outpatient Medications on File Prior to Visit  Medication Sig Dispense Refill  . albuterol (PROVENTIL) (2.5 MG/3ML) 0.083% nebulizer solution Take 2.5 mg by nebulization every 6 (six) hours as needed for wheezing.    . Cholecalciferol (VITAMIN D-3) 5000 UNITS TABS Take 1 tablet by mouth daily.    . DELZICOL 400 MG CPDR DR capsule Take 800 mg by mouth 2 (two) times daily.    Marland Kitchen desmopressin (DDAVP) 0.1 MG tablet Take 0.3 mg by mouth daily.     Marland Kitchen estradiol (ESTRACE) 2 MG tablet Take 2 mg by mouth 2 (two) times daily.     . famotidine (PEPCID) 20 MG tablet Take 20 mg by mouth at bedtime.    . Multiple Vitamin (MULTIVITAMIN WITH MINERALS) TABS tablet Take 1 tablet by mouth daily. 30 tablet 0  . mycophenolate (CELLCEPT) 500 MG tablet Take 500 mg by mouth 2 (two) times daily.     . predniSONE (DELTASONE) 5 MG tablet Take 7.5 mg by mouth daily.     . progesterone (PROMETRIUM) 100 MG capsule Take 100 mg by mouth at bedtime.    . Omega-3 Fatty Acids (FISH OIL) 1000 MG CAPS Take 2 capsules (2,000 mg total) by mouth 2 (two) times daily. (Patient not taking: Reported on 04/30/2017) 120 capsule 5   No current facility-administered medications on file prior to visit.      Past Medical History:  Diagnosis Date  . Arthritis    osteoarthritis  . Asthma   . Blind left eye   . Cancer ([place])    skin  . Crohn's disease ([place])   . CTS (carpal tunnel syndrome)   . DJD (degenerative joint disease)    Both cervical spine and LS spine  . Fibromyalgia   . GERD (gastroesophageal reflux disease)   . Heart murmur   . Herniated nucleus pulposus   . Hyperlipidemia   . Hypertension   . IBS (irritable bowel syndrome)   . Leukocytosis   . Myasthenia gravis   . Myasthenia gravis ([place])    Allergies  Allergen Reactions  . Percocet [Oxycodone-Acetaminophen] Hives  .  Sulfamethoxazole-Trimethoprim Hives  . Nucynta [Tapentadol]     Headache   . Orange Concentrate [Flavoring Agent]     Acid reflux  . Other Other (See Comments), Hives and Nausea Only    Some nuts cause a rash  . Tomato     Burns skin  . Bioflavonoids Rash    Causes burning of skin and blisters  . Cefixime     Due to myasthenia gravis   Family History  Problem Relation Age of Onset  . Asthma Daughter   . Heart disease Mother   . Cancer Mother   . Hyperlipidemia Mother   . Hypertension Mother   . Diabetes Father     Social History   Socioeconomic History  . Marital status: Married    Spouse name: None  . Number of children: 1  . Years of education: None  . Highest  education level: None  Social Needs  . Financial resource strain: None  . Food insecurity - worry: None  . Food insecurity - inability: None  . Transportation needs - medical: None  . Transportation needs - non-medical: None  Occupational History  . Occupation: disabled  Tobacco Use  . Smoking status: Never Smoker  . Smokeless tobacco: Never Used  Substance and Sexual Activity  . Alcohol use: Yes    Comment: occassional wine  . Drug use: No  . Sexual activity: None  Other Topics Concern  . None  Social History Narrative  . None    Vitals:   04/30/17 0952  BP: 126/80  Pulse: 88  Resp: 12  Temp: 98.5 F (36.9 C)  SpO2: 98%   Body mass index is 27.61 kg/m.   Physical Exam  Nursing note and vitals reviewed. Constitutional: She is oriented to person, place, and time. She appears well-developed. No distress.  HENT:  Head: Normocephalic and atraumatic.  Mouth/Throat: Oropharynx is clear and moist and mucous membranes are normal.  Eyes: Conjunctivae are normal. Left pupil is not reactive. Pupils are unequal.  Cardiovascular: Normal rate and regular rhythm.  No murmur heard. Pulses:      Dorsalis pedis pulses are 2+ on the right side, and 2+ on the left side.  Respiratory: Effort normal and  breath sounds normal. No respiratory distress.  GI: Soft. She exhibits no mass. There is no hepatomegaly. There is no tenderness.  Musculoskeletal: She exhibits no edema.       Cervical back: She exhibits no tenderness and no bony tenderness.       Thoracic back: She exhibits no tenderness and no bony tenderness.       Lumbar back: She exhibits no tenderness and no bony tenderness.  No signs of synovitis.  Lymphadenopathy:    She has no cervical adenopathy.  Neurological: She is alert and oriented to person, place, and time. She has normal strength. She displays tremor. Coordination and gait normal.  Mild tremor of hand,bilateral. Not noted at rest.   Skin: Skin is warm. Rash noted. Rash is not vesicular. No erythema.  Mild erythematous scaly rash on nasal labial folds, bilateral.  Psychiatric: Her mood appears anxious.  Well groomed, good eye contact.      ASSESSMENT AND PLAN:   Ms. Freda Jaquith was seen today for 5 months follow-up + to addressed new concerns..  Orders Placed This Encounter  Procedures  . Lipid panel  . VITAMIN D 25 Hydroxy (Vit-D Deficiency, Fractures)  . Comprehensive metabolic panel  . TSH  . CBC with Differential/Platelet  . LDL cholesterol, direct    Lab Results  Component Value Date   CHOL 139 04/30/2017   HDL 34.40 (L) 04/30/2017   LDLCALC 162 (H) 08/08/2013   LDLDIRECT 52.0 04/30/2017   TRIG 354.0 (H) 04/30/2017   CHOLHDL 4 04/30/2017   Lab Results  Component Value Date   CREATININE 0.72 04/30/2017   BUN 20 04/30/2017   NA 139 04/30/2017   K 3.4 (L) 04/30/2017   CL 99 04/30/2017   CO2 25 04/30/2017   Lab Results  Component Value Date   TSH 3.38 04/30/2017   Lab Results  Component Value Date   WBC 16.6 (H) 04/30/2017   HGB 13.0 04/30/2017   HCT 37.5 04/30/2017   MCV 96.9 04/30/2017   PLT 277 04/30/2017   Lab Results  Component Value Date   VITAMINB12 255 12/26/2016   Lab Results  Component Value  Date   ALT 9  04/30/2017   AST 11 04/30/2017   ALKPHOS 41 04/30/2017   BILITOT 0.6 04/30/2017     RLS (restless legs syndrome)   Stable. No changes in current management. F/U in 6-12 months.  -     rOPINIRole (REQUIP) 1 MG tablet; Take 1-2 tablets (1-2 mg total) by mouth at bedtime.  Vitamin D deficiency, unspecified  No changes in current management, will follow labs done today and will give further recommendations accordingly.  -     VITAMIN D 25 Hydroxy (Vit-D Deficiency, Fractures) -     Comprehensive metabolic panel  Gastroesophageal reflux disease, esophagitis presence not specified  Stable. GERD precautions to continue. No changes in current management. F/U in 12 months.  -     pantoprazole (PROTONIX) 40 MG tablet; Take 1 tablet (40 mg total) by mouth daily.  Anxiety disorder, unspecified type  Anxiety still present. Reporting problem as stable,so no changes in current management.  -     citalopram (CELEXA) 40 MG tablet; Take 1 tablet (40 mg total) by mouth daily.  B12 deficiency  .Further recommendations will be given according to B12 results.  -     cyanocobalamin ((VITAMIN B-12)) injection 1,000 mcg  Mixed hyperlipidemia  No changes in current management, will follow labs done today and will give further recommendations accordingly.  -     simvastatin (ZOCOR) 20 MG tablet; Take 1 tablet (20 mg total) by mouth daily. -     Lipid panel -     LDL cholesterol, direct  Leukocytosis, unspecified type  She is on chronic Prednisone treatment,so it can be related to this. Further recommendations will be given according to labs results.  -     CBC with Differential/Platelet  Tremor  Possible etiologies discussed: essential tremor,meds side effects,anxiety among some. She is already established with neuro,so recommend arranging an appt to discuss this problem. Further recommendations will be given according to labs results.  -     Comprehensive metabolic panel -      TSH  Seborrheic dermatitis  Educated about Dx and treatment options. Recommend topical Ketoconazole cream.  -     ketoconazole (NIZORAL) 2 % cream; Apply 1 application topically daily.  Essential hypertension  Adequately controlled. No changes in current management. DASH diet to continue. Eye exam recommended annually. F/U in 6 months, before if needed.  -     losartan (COZAAR) 50 MG tablet; Take 1 tablet (50 mg total) by mouth daily. -     triamterene-hydrochlorothiazide (DYAZIDE) 37.5-25 MG capsule; Take 1 each (1 capsule total) by mouth every morning.  Fibromyalgia syndrome  Side effects of chronic NSAID's use discussed. No changes in current management.  -     meloxicam (MOBIC) 15 MG tablet; Take 1 tablet (15 mg total) by mouth daily.     10:23 Am to 11:04 Am face to face OV. > 50% was dedicated to discussion of all above Dx, prognosis, treatment options of some, and some side effects of medications.  She has several issues she wanted to discussed today, I could not every single one in detail.     -Ms. Heena Woodbury was advised to return sooner than planned today if new concerns arise.       Cantrell Martus G. Martinique, MD  [place]. [place] office.

## 2017-05-02 DIAGNOSIS — M47816 Spondylosis without myelopathy or radiculopathy, lumbar region: Secondary | ICD-10-CM | POA: Diagnosis not present

## 2017-05-02 DIAGNOSIS — M542 Cervicalgia: Secondary | ICD-10-CM | POA: Diagnosis not present

## 2017-05-02 DIAGNOSIS — M47812 Spondylosis without myelopathy or radiculopathy, cervical region: Secondary | ICD-10-CM | POA: Diagnosis not present

## 2017-05-02 DIAGNOSIS — G894 Chronic pain syndrome: Secondary | ICD-10-CM | POA: Diagnosis not present

## 2017-05-06 ENCOUNTER — Encounter: Payer: Self-pay | Admitting: Family Medicine

## 2017-05-06 MED ORDER — VITAMIN D (ERGOCALCIFEROL) 1.25 MG (50000 UNIT) PO CAPS: ORAL_CAPSULE | ORAL | 3 refills | Status: DC

## 2017-05-06 MED ORDER — POTASSIUM CHLORIDE 20 MEQ PO PACK: 10.0000 meq | PACK | Freq: Every day | ORAL | 1 refills | Status: DC

## 2017-05-22 DIAGNOSIS — Z888 Allergy status to other drugs, medicaments and biological substances status: Secondary | ICD-10-CM | POA: Diagnosis not present

## 2017-05-22 DIAGNOSIS — G894 Chronic pain syndrome: Secondary | ICD-10-CM | POA: Diagnosis not present

## 2017-05-22 DIAGNOSIS — M47817 Spondylosis without myelopathy or radiculopathy, lumbosacral region: Secondary | ICD-10-CM | POA: Diagnosis not present

## 2017-05-22 DIAGNOSIS — Z85820 Personal history of malignant melanoma of skin: Secondary | ICD-10-CM | POA: Diagnosis not present

## 2017-05-22 DIAGNOSIS — M5136 Other intervertebral disc degeneration, lumbar region: Secondary | ICD-10-CM | POA: Diagnosis not present

## 2017-05-22 DIAGNOSIS — M47816 Spondylosis without myelopathy or radiculopathy, lumbar region: Secondary | ICD-10-CM | POA: Diagnosis not present

## 2017-05-22 DIAGNOSIS — M797 Fibromyalgia: Secondary | ICD-10-CM | POA: Diagnosis not present

## 2017-05-22 DIAGNOSIS — G7000 Myasthenia gravis without (acute) exacerbation: Secondary | ICD-10-CM | POA: Diagnosis not present

## 2017-05-22 DIAGNOSIS — M5417 Radiculopathy, lumbosacral region: Secondary | ICD-10-CM | POA: Diagnosis not present

## 2017-05-22 DIAGNOSIS — R69 Illness, unspecified: Secondary | ICD-10-CM | POA: Diagnosis not present

## 2017-05-22 DIAGNOSIS — I10 Essential (primary) hypertension: Secondary | ICD-10-CM | POA: Diagnosis not present

## 2017-05-22 DIAGNOSIS — Z885 Allergy status to narcotic agent status: Secondary | ICD-10-CM | POA: Diagnosis not present

## 2017-05-22 DIAGNOSIS — Z882 Allergy status to sulfonamides status: Secondary | ICD-10-CM | POA: Diagnosis not present

## 2017-05-28 DIAGNOSIS — G894 Chronic pain syndrome: Secondary | ICD-10-CM | POA: Diagnosis not present

## 2017-05-28 DIAGNOSIS — Z79891 Long term (current) use of opiate analgesic: Secondary | ICD-10-CM | POA: Diagnosis not present

## 2017-05-28 DIAGNOSIS — Z79899 Other long term (current) drug therapy: Secondary | ICD-10-CM | POA: Diagnosis not present

## 2017-06-11 DIAGNOSIS — K5080 Crohn's disease of both small and large intestine without complications: Secondary | ICD-10-CM | POA: Diagnosis not present

## 2017-06-22 ENCOUNTER — Other Ambulatory Visit: Payer: Self-pay | Admitting: Physician Assistant

## 2017-06-22 ENCOUNTER — Ambulatory Visit
Admission: RE | Admit: 2017-06-22 | Discharge: 2017-06-22 | Disposition: A | Discharge status: Home / Self Care | Payer: Medicare HMO | Source: Ambulatory Visit | Attending: Physician Assistant | Admitting: Physician Assistant

## 2017-06-22 DIAGNOSIS — M47812 Spondylosis without myelopathy or radiculopathy, cervical region: Secondary | ICD-10-CM | POA: Diagnosis not present

## 2017-06-22 DIAGNOSIS — Z79899 Other long term (current) drug therapy: Secondary | ICD-10-CM | POA: Diagnosis not present

## 2017-06-22 DIAGNOSIS — G894 Chronic pain syndrome: Secondary | ICD-10-CM | POA: Diagnosis not present

## 2017-06-22 DIAGNOSIS — M47816 Spondylosis without myelopathy or radiculopathy, lumbar region: Secondary | ICD-10-CM | POA: Diagnosis not present

## 2017-06-22 DIAGNOSIS — M542 Cervicalgia: Secondary | ICD-10-CM | POA: Diagnosis not present

## 2017-06-22 DIAGNOSIS — Z79891 Long term (current) use of opiate analgesic: Secondary | ICD-10-CM | POA: Diagnosis not present

## 2017-06-27 DIAGNOSIS — M47812 Spondylosis without myelopathy or radiculopathy, cervical region: Secondary | ICD-10-CM | POA: Diagnosis not present

## 2017-07-11 DIAGNOSIS — M5136 Other intervertebral disc degeneration, lumbar region: Secondary | ICD-10-CM | POA: Diagnosis not present

## 2017-07-11 DIAGNOSIS — G894 Chronic pain syndrome: Secondary | ICD-10-CM | POA: Diagnosis not present

## 2017-07-11 DIAGNOSIS — M5417 Radiculopathy, lumbosacral region: Secondary | ICD-10-CM | POA: Diagnosis not present

## 2017-07-11 DIAGNOSIS — M47812 Spondylosis without myelopathy or radiculopathy, cervical region: Secondary | ICD-10-CM | POA: Diagnosis not present

## 2017-07-25 ENCOUNTER — Other Ambulatory Visit: Payer: Self-pay | Admitting: *Deleted

## 2017-07-25 ENCOUNTER — Ambulatory Visit: Payer: Medicare HMO

## 2017-07-25 ENCOUNTER — Telehealth: Payer: Self-pay | Admitting: Family Medicine

## 2017-07-25 DIAGNOSIS — G894 Chronic pain syndrome: Secondary | ICD-10-CM | POA: Diagnosis not present

## 2017-07-25 DIAGNOSIS — M47812 Spondylosis without myelopathy or radiculopathy, cervical region: Secondary | ICD-10-CM | POA: Diagnosis not present

## 2017-07-25 DIAGNOSIS — M47816 Spondylosis without myelopathy or radiculopathy, lumbar region: Secondary | ICD-10-CM | POA: Diagnosis not present

## 2017-07-25 DIAGNOSIS — Z79891 Long term (current) use of opiate analgesic: Secondary | ICD-10-CM | POA: Diagnosis not present

## 2017-07-25 DIAGNOSIS — Z79899 Other long term (current) drug therapy: Secondary | ICD-10-CM | POA: Diagnosis not present

## 2017-07-25 DIAGNOSIS — E538 Deficiency of other specified B group vitamins: Secondary | ICD-10-CM

## 2017-07-25 NOTE — Telephone Encounter (Signed)
Pt came into the office wanting to receive a B-12 injection and stated that she is to get it every 8 weeks.  Pt hasn't had one since 12/2016.  May I have a order so that the pt can get the B-12 inj

## 2017-07-25 NOTE — Telephone Encounter (Signed)
She is supposed to get B12 1000 mcg q 8 weeks, see lab note in 12/2016. It is Ok to resume B12 injections.  Thanks, BJ

## 2017-07-25 NOTE — Telephone Encounter (Signed)
Message sent to Dr. Jordan for review and approval. 

## 2017-07-25 NOTE — Telephone Encounter (Signed)
Spoke with patient after speaking with Dr. Martinique, patient was supposed to have B 12 lab done in 04/2017 at [facility], but it wasn't ordered. Patient will be in office within the next 2 days to have lab drawn and then instructions on B 12 injections will be given from there. Patient verbalized understanding.

## 2017-08-06 ENCOUNTER — Other Ambulatory Visit (INDEPENDENT_AMBULATORY_CARE_PROVIDER_SITE_OTHER): Payer: Medicare HMO

## 2017-08-06 DIAGNOSIS — M47816 Spondylosis without myelopathy or radiculopathy, lumbar region: Secondary | ICD-10-CM | POA: Diagnosis not present

## 2017-08-06 DIAGNOSIS — M47812 Spondylosis without myelopathy or radiculopathy, cervical region: Secondary | ICD-10-CM | POA: Diagnosis not present

## 2017-08-06 DIAGNOSIS — M797 Fibromyalgia: Secondary | ICD-10-CM | POA: Diagnosis not present

## 2017-08-06 DIAGNOSIS — E538 Deficiency of other specified B group vitamins: Secondary | ICD-10-CM | POA: Diagnosis not present

## 2017-08-06 DIAGNOSIS — G894 Chronic pain syndrome: Secondary | ICD-10-CM | POA: Diagnosis not present

## 2017-08-06 LAB — VITAMIN B12: VITAMIN B 12: 204 pg/mL — AB (ref 211–911)

## 2017-08-22 ENCOUNTER — Other Ambulatory Visit: Payer: Self-pay | Admitting: Physician Assistant

## 2017-08-22 ENCOUNTER — Ambulatory Visit
Admission: RE | Admit: 2017-08-22 | Discharge: 2017-08-22 | Disposition: A | Discharge status: Home / Self Care | Payer: Medicare HMO | Source: Ambulatory Visit | Attending: Physician Assistant | Admitting: Physician Assistant

## 2017-08-22 DIAGNOSIS — M47812 Spondylosis without myelopathy or radiculopathy, cervical region: Secondary | ICD-10-CM | POA: Diagnosis not present

## 2017-08-22 DIAGNOSIS — M25512 Pain in left shoulder: Secondary | ICD-10-CM | POA: Diagnosis not present

## 2017-08-22 DIAGNOSIS — M47816 Spondylosis without myelopathy or radiculopathy, lumbar region: Secondary | ICD-10-CM | POA: Diagnosis not present

## 2017-08-22 DIAGNOSIS — M542 Cervicalgia: Secondary | ICD-10-CM | POA: Diagnosis not present

## 2017-08-22 DIAGNOSIS — G894 Chronic pain syndrome: Secondary | ICD-10-CM | POA: Diagnosis not present

## 2017-08-28 ENCOUNTER — Encounter: Payer: Self-pay | Admitting: Family Medicine

## 2017-08-28 ENCOUNTER — Ambulatory Visit (INDEPENDENT_AMBULATORY_CARE_PROVIDER_SITE_OTHER): Payer: Medicare HMO | Admitting: Family Medicine

## 2017-08-28 VITALS — BP 126/83 | HR 91 | Temp 98.8°F | Resp 12 | Ht 61.0 in | Wt 144.5 lb

## 2017-08-28 DIAGNOSIS — E559 Vitamin D deficiency, unspecified: Secondary | ICD-10-CM

## 2017-08-28 DIAGNOSIS — R202 Paresthesia of skin: Secondary | ICD-10-CM

## 2017-08-28 DIAGNOSIS — E876 Hypokalemia: Secondary | ICD-10-CM

## 2017-08-28 DIAGNOSIS — I10 Essential (primary) hypertension: Secondary | ICD-10-CM

## 2017-08-28 DIAGNOSIS — E538 Deficiency of other specified B group vitamins: Secondary | ICD-10-CM

## 2017-08-28 DIAGNOSIS — R222 Localized swelling, mass and lump, trunk: Secondary | ICD-10-CM

## 2017-08-28 DIAGNOSIS — E782 Mixed hyperlipidemia: Secondary | ICD-10-CM

## 2017-08-28 MED ORDER — DESONIDE 0.05 % EX CREA: TOPICAL_CREAM | Freq: Every day | CUTANEOUS | 0 refills | Status: DC | PRN

## 2017-08-28 MED ORDER — CYANOCOBALAMIN 1000 MCG/ML IJ SOLN
1000.0000 ug | Freq: Once | INTRAMUSCULAR | Status: AC
Start: 2017-08-28 — End: 2017-08-28
  Administered 2017-08-28: 1000 ug via INTRAMUSCULAR

## 2017-08-28 NOTE — Progress Notes (Signed)
HPI:   Teresa Lee is a 57 y.o. female, who is here today for 4 months follow up.   She was last seen on 04/21/17.  Since her last OV she has had spinal cord stimulator implanted.  Fibromyalgia currently on Mobic 15 mg daily as needed.   Hyperlipidemia:  Currently on Simvastatin 20 mg ,it was increased from 20 mg to 40 mg after last visit, she did not do so. Following a low fat diet: Yes.  She has not noted side effects with medication.  Lab Results  Component Value Date   CHOL 139 04/30/2017   HDL 34.40 (L) 04/30/2017   LDLCALC 162 (H) 08/08/2013   LDLDIRECT 52.0 04/30/2017   TRIG 354.0 (H) 04/30/2017   CHOLHDL 4 04/30/2017       Hypertension:  Currently on Losartan 50 mg and Triamterene-HCTZ 37.5-25 mg daily.    She is taking medications as instructed, no side effects reported.  She has not noted unusual headache, visual changes, exertional chest pain, dyspnea,  focal weakness, or edema. HypoK+ she is on KDUR 20-40 meq packet daily.  Lab Results  Component Value Date   CREATININE 0.72 04/30/2017   BUN 20 04/30/2017   NA 139 04/30/2017   K 3.4 (L) 04/30/2017   CL 99 04/30/2017   CO2 25 04/30/2017    Last visit she was also c/o pruritic facial rash,thought to be seborrheic dermatitis and Ketoconazole cream was recommended.  She is also concerned about "knot" she noted this morning,left supraclavicular.. She is not sure for how long it has been there. It is not tender.   Last visit she was c/o 2 weeks of  tremor, recommended to continue following with her neurologist. She is reporting improvement. She mentions that sometimes when she is scratching she feels like she has involuntary movements,like scratching faster. No LOC or seizure activity.  Needle like sensation left toes, intermittently for a bout a month.  She has not started B12 supplementations.  No focal deficit.   Lab Results  Component Value Date   [id number] 366 (L)  08/06/2017   Today she is concerned about a "knot" she found this morning when she was driving left side of her trapezium. She is not sure for how long the lesion has been there, no skin changes, and no pain.  Vit D deficiency: Currently she is on Ergocalciferol 50,000 U  Weekly. She was supposed to be on Ergocalciferol q 2 weeks.She states that she completed 8 weekly treatment then q 2 weeks,when she refilled med she started weekly Ergocalciferol again because it was on instructions.   Last 25 OH vit D done in 04/2017 was 23.2  Anxiety on Celexa 40 mg daily.  Denies suicidal thoughts.   Review of Systems  Constitutional: Positive for fatigue. Negative for activity change, appetite change and fever.  HENT: Negative for mouth sores, nosebleeds and trouble swallowing.   Eyes: Negative for redness and visual disturbance.  Respiratory: Negative for cough, shortness of breath and wheezing.   Cardiovascular: Negative for chest pain, palpitations and leg swelling.  Gastrointestinal: Negative for abdominal pain, nausea and vomiting.       Negative for changes in bowel habits.  Genitourinary: Negative for decreased urine volume, dysuria and hematuria.  Musculoskeletal: Positive for arthralgias, back pain, myalgias and neck pain. Negative for gait problem.  Neurological: Negative for syncope, weakness and headaches.  Psychiatric/Behavioral: Negative for confusion. The patient is nervous/anxious.      Current Outpatient  Medications on File Prior to Visit  Medication Sig Dispense Refill  . albuterol (PROVENTIL HFA) 108 (90 Base) MCG/ACT inhaler Inhale into the lungs.    Marland Kitchen albuterol (PROVENTIL) (2.5 MG/3ML) 0.083% nebulizer solution Take 2.5 mg by nebulization every 6 (six) hours as needed for Wheezing.    . Cholecalciferol (VITAMIN D-3) 5000 UNITS TABS Take 1 tablet by mouth daily.    . citalopram (CELEXA) 40 MG tablet Take 1 tablet (40 mg total) by mouth daily. 90 tablet 1  . DELZICOL 400 MG  CPDR DR capsule Take 800 mg by mouth 2 (two) times daily.    Marland Kitchen desmopressin (DDAVP) 0.1 MG tablet Take 0.3 mg by mouth daily.     Marland Kitchen estradiol (ESTRACE) 2 MG tablet Take 2 mg by mouth 2 (two) times daily.     Marland Kitchen HYDROcodone-acetaminophen (NORCO) 10-325 MG tablet     . ketoconazole (NIZORAL) 2 % cream Apply 1 application topically daily. 15 g 1  . losartan (COZAAR) 50 MG tablet Take 1 tablet (50 mg total) by mouth daily. 90 tablet 2  . meloxicam (MOBIC) 15 MG tablet Take 1 tablet (15 mg total) by mouth daily. 90 tablet 1  . mycophenolate (CELLCEPT) 500 MG tablet Take by mouth.    . pantoprazole (PROTONIX) 40 MG tablet Take 1 tablet (40 mg total) by mouth daily. 90 tablet 3  . potassium chloride (KLOR-CON) 20 MEQ packet Take 10-20 mEq by mouth daily. 90 tablet 1  . potassium chloride SA (K-DUR,KLOR-CON) 20 MEQ tablet Take by mouth.    . predniSONE (DELTASONE) 5 MG tablet Take 7.5 mg by mouth daily.     . progesterone (PROMETRIUM) 100 MG capsule Take 100 mg by mouth at bedtime.    Marland Kitchen rOPINIRole (REQUIP) 1 MG tablet Take 1-2 tablets (1-2 mg total) by mouth at bedtime. 180 tablet 2  . simvastatin (ZOCOR) 20 MG tablet Take 1 tablet (20 mg total) by mouth daily. 90 tablet 3  . triamterene-hydrochlorothiazide (DYAZIDE) 37.5-25 MG capsule Take 1 each (1 capsule total) by mouth every morning. 90 capsule 1  . Vitamin D, Ergocalciferol, (DRISDOL) 50000 units CAPS capsule 1 cap weekly x 8 weeks then q 2 weeks 12 capsule 3   No current facility-administered medications on file prior to visit.      Past Medical History:  Diagnosis Date  . Arthritis    osteoarthritis  . Asthma   . Blind left eye   . Cancer ([place])    skin  . Crohn's disease ([place])   . CTS (carpal tunnel syndrome)   . DJD (degenerative joint disease)    Both cervical spine and LS spine  . Fibromyalgia   . GERD (gastroesophageal reflux disease)   . Heart murmur   . Herniated nucleus pulposus   . Hyperlipidemia   . Hypertension   .  IBS (irritable bowel syndrome)   . Leukocytosis   . Myasthenia gravis   . Myasthenia gravis ([place])    Allergies  Allergen Reactions  . Latex Other (See Comments)    BLISTER  . Lorazepam     Other reaction(s): Hallucinations  . Percocet [Oxycodone-Acetaminophen] Hives  . Sulfa Antibiotics Hives  . Sulfamethoxazole-Trimethoprim Hives  . Tapentadol Hcl Other (See Comments)    HEADACHES NUCENTA  . Nucynta [Tapentadol]     Headache   . Orange Concentrate [Flavoring Agent]     Acid reflux  . Other Other (See Comments), Hives and Nausea Only    Some nuts cause a rash  .  Tomato     Burns skin  . Bioflavonoids Rash    Causes burning of skin and blisters  . Cefixime     Due to myasthenia gravis    Social History   Socioeconomic History  . Marital status: Married    Spouse name: Not on file  . Number of children: 1  . Years of education: Not on file  . Highest education level: Not on file  Occupational History  . Occupation: disabled  Social Needs  . Financial resource strain: Not on file  . Food insecurity:    Worry: Not on file    Inability: Not on file  . Transportation needs:    Medical: Not on file    Non-medical: Not on file  Tobacco Use  . Smoking status: Never Smoker  . Smokeless tobacco: Never Used  Substance and Sexual Activity  . Alcohol use: Yes    Comment: occassional wine  . Drug use: No  . Sexual activity: Not on file  Lifestyle  . Physical activity:    Days per week: Not on file    Minutes per session: Not on file  . Stress: Not on file  Relationships  . Social connections:    Talks on phone: Not on file    Gets together: Not on file    Attends religious service: Not on file    Active member of club or organization: Not on file    Attends meetings of clubs or organizations: Not on file    Relationship status: Not on file  Other Topics Concern  . Not on file  Social History Narrative  . Not on file    Vitals:   08/28/17 0930  BP: 126/83   Pulse: 91  Resp: 12  Temp: 98.8 F (37.1 C)  SpO2: 98%   Body mass index is 27.3 kg/m.   Physical Exam  Nursing note and vitals reviewed. Constitutional: She is oriented to person, place, and time. She appears well-developed. No distress.  HENT:  Head: Normocephalic and atraumatic.  Mouth/Throat: Oropharynx is clear and moist and mucous membranes are normal.  Eyes: Pupils are equal, round, and reactive to light. Conjunctivae and EOM are normal.  Neck: No tracheal deviation present. No thyroid mass and no thyromegaly present.  Left supraclavicular nodular lesion,no tender,mobile,no tender. About 2-2.5 cm.  Cardiovascular: Normal rate and regular rhythm.  No murmur heard. Pulses:      Dorsalis pedis pulses are 2+ on the right side, and 2+ on the left side.  Respiratory: Effort normal and breath sounds normal. No respiratory distress.  GI: Soft. She exhibits no mass. There is no hepatomegaly. There is no tenderness.  Musculoskeletal: She exhibits no edema.       Cervical back: She exhibits no tenderness and no bony tenderness.  Lymphadenopathy:    She has no cervical adenopathy.  Neurological: She is alert and oriented to person, place, and time. She has normal strength. Coordination normal.  Skin: Skin is warm. No erythema.  Psychiatric: Her mood appears anxious.  Well groomed, good eye contact.       ASSESSMENT AND PLAN:   Teresa Lee was seen today for 4 months follow-up.  Orders Placed This Encounter  Procedures  . US Soft Tissue Head/Neck  . Basic metabolic panel  . Lipid panel  . VITAMIN D 25 Hydroxy (Vit-D Deficiency, Fractures)   Lab Results  Component Value Date   CHOL 157 08/30/2017   HDL 41.80 08/30/2017  LDLCALC 162 (H) 08/08/2013   LDLDIRECT 61.0 08/30/2017   TRIG 309.0 (H) 08/30/2017   CHOLHDL 4 08/30/2017   Lab Results  Component Value Date   CREATININE 0.86 08/30/2017   BUN 26 (H) 08/30/2017   NA 140 08/30/2017   K 3.6  08/30/2017   CL 103 08/30/2017   CO2 25 08/30/2017     1. Essential hypertension  Adequately controlled. No changes in current management. DASH-low salt diet to continue. Eye exam recommended annually. F/U in 6 months, before if needed.  - Basic metabolic panel; Future  2. Vitamin D deficiency, unspecified  No changes in current management, we will give further recommendations according to Vit D results.  - VITAMIN D 25 Hydroxy (Vit-D Deficiency, Fractures); Future  3. Hypokalemia  No changes in current management, will follow labs done today and will give further recommendations accordingly.   4. Mixed hyperlipidemia  Continue Zocor 20 mg daily and low fat diet. Further recommendations according to FLP results.  - Lipid panel; Future  5. Supraclavicular mass  ? Lipoma. Soft tissue US will be arranged.  - US Soft Tissue Head/Neck; Future  6. Pins and needles sensation  ? Peripheral neuropathy. We will hold on further work up until B12 is repleated. Instructed about warning signs.  7. B12 deficiency  Will start B12 supplementation today.  - cyanocobalamin ((VITAMIN B-12)) injection 1,000 mcg       Betty G. Martinique, MD  [facility]. [place] office.

## 2017-08-28 NOTE — Patient Instructions (Addendum)
A few things to remember from today's visit:   Essential hypertension - Plan: Basic metabolic panel  Vitamin D deficiency, unspecified - Plan: VITAMIN D 25 Hydroxy (Vit-D Deficiency, Fractures)  Hypokalemia  Mixed hyperlipidemia - Plan: Lipid panel  Supraclavicular mass - Plan: US Soft Tissue Head/Neck  Pins and needles sensation  We will continue following skin cessation, hopefully will improve when vitamin B12 is replaced.  Please be sure medication list is accurate. If a new problem present, please set up appointment sooner than planned today.

## 2017-08-30 ENCOUNTER — Other Ambulatory Visit (INDEPENDENT_AMBULATORY_CARE_PROVIDER_SITE_OTHER): Payer: Medicare HMO

## 2017-08-30 DIAGNOSIS — E782 Mixed hyperlipidemia: Secondary | ICD-10-CM

## 2017-08-30 DIAGNOSIS — I10 Essential (primary) hypertension: Secondary | ICD-10-CM

## 2017-08-30 DIAGNOSIS — E559 Vitamin D deficiency, unspecified: Secondary | ICD-10-CM

## 2017-08-30 LAB — BASIC METABOLIC PANEL
BUN: 26 mg/dL — AB (ref 6–23)
CALCIUM: 9.5 mg/dL (ref 8.4–10.5)
CO2: 25 meq/L (ref 19–32)
CREATININE: 0.86 mg/dL (ref 0.40–1.20)
Chloride: 103 mEq/L (ref 96–112)
GFR: 72.67 mL/min (ref >60.00)
Glucose, Bld: 90 mg/dL (ref 70–99)
Potassium: 3.6 mEq/L (ref 3.5–5.1)
Sodium: 140 mEq/L (ref 135–145)

## 2017-08-30 LAB — LIPID PANEL
CHOLESTEROL: 157 mg/dL (ref 0–200)
HDL: 41.8 mg/dL (ref >39.00)
NONHDL: 115.69
TRIGLYCERIDES: 309 mg/dL — AB (ref 0.0–149.0)
Total CHOL/HDL Ratio: 4
VLDL: 61.8 mg/dL — ABNORMAL HIGH (ref 0.0–40.0)

## 2017-08-30 LAB — LDL CHOLESTEROL, DIRECT: LDL DIRECT: 61 mg/dL

## 2017-08-30 LAB — VITAMIN D 25 HYDROXY (VIT D DEFICIENCY, FRACTURES): VITD: 30.55 ng/mL (ref 30.00–100.00)

## 2017-09-02 ENCOUNTER — Encounter: Payer: Self-pay | Admitting: Family Medicine

## 2017-09-04 ENCOUNTER — Ambulatory Visit (INDEPENDENT_AMBULATORY_CARE_PROVIDER_SITE_OTHER): Payer: Medicare HMO | Admitting: *Deleted

## 2017-09-04 DIAGNOSIS — E538 Deficiency of other specified B group vitamins: Secondary | ICD-10-CM

## 2017-09-04 MED ORDER — CYANOCOBALAMIN 1000 MCG/ML IJ SOLN
1000.0000 ug | Freq: Once | INTRAMUSCULAR | Status: AC
  Administered 2017-09-04: 1000 ug via INTRAMUSCULAR

## 2017-09-04 NOTE — Progress Notes (Signed)
Per orders of Dr. Jordan, injection of B12 given by Dashonda Bonneau J Medea Deines. Patient tolerated injection well. 

## 2017-09-05 ENCOUNTER — Encounter (HOSPITAL_COMMUNITY): Payer: Self-pay | Admitting: Emergency Medicine

## 2017-09-05 ENCOUNTER — Emergency Department (HOSPITAL_COMMUNITY): Payer: Medicare HMO

## 2017-09-05 ENCOUNTER — Other Ambulatory Visit: Payer: Self-pay

## 2017-09-05 ENCOUNTER — Emergency Department (HOSPITAL_COMMUNITY)
Admission: EM | Admit: 2017-09-05 | Discharge: 2017-09-05 | Disposition: A | Discharge status: Home / Self Care | Payer: Medicare HMO | Attending: Emergency Medicine | Admitting: Emergency Medicine

## 2017-09-05 DIAGNOSIS — M7918 Myalgia, other site: Secondary | ICD-10-CM | POA: Diagnosis not present

## 2017-09-05 DIAGNOSIS — S7011XA Contusion of right thigh, initial encounter: Secondary | ICD-10-CM | POA: Insufficient documentation

## 2017-09-05 DIAGNOSIS — I10 Essential (primary) hypertension: Secondary | ICD-10-CM | POA: Diagnosis not present

## 2017-09-05 DIAGNOSIS — S46912A Strain of unspecified muscle, fascia and tendon at shoulder and upper arm level, left arm, initial encounter: Secondary | ICD-10-CM | POA: Insufficient documentation

## 2017-09-05 DIAGNOSIS — Y9389 Activity, other specified: Secondary | ICD-10-CM | POA: Diagnosis not present

## 2017-09-05 DIAGNOSIS — Y92410 Unspecified street and highway as the place of occurrence of the external cause: Secondary | ICD-10-CM | POA: Diagnosis not present

## 2017-09-05 DIAGNOSIS — J45909 Unspecified asthma, uncomplicated: Secondary | ICD-10-CM | POA: Diagnosis not present

## 2017-09-05 DIAGNOSIS — Z91040 Latex allergy status: Secondary | ICD-10-CM | POA: Insufficient documentation

## 2017-09-05 DIAGNOSIS — Z79899 Other long term (current) drug therapy: Secondary | ICD-10-CM | POA: Insufficient documentation

## 2017-09-05 DIAGNOSIS — Y999 Unspecified external cause status: Secondary | ICD-10-CM | POA: Diagnosis not present

## 2017-09-05 DIAGNOSIS — S46911A Strain of unspecified muscle, fascia and tendon at shoulder and upper arm level, right arm, initial encounter: Secondary | ICD-10-CM | POA: Diagnosis not present

## 2017-09-05 DIAGNOSIS — S4992XA Unspecified injury of left shoulder and upper arm, initial encounter: Secondary | ICD-10-CM | POA: Diagnosis not present

## 2017-09-05 DIAGNOSIS — Z85828 Personal history of other malignant neoplasm of skin: Secondary | ICD-10-CM | POA: Insufficient documentation

## 2017-09-05 DIAGNOSIS — R0789 Other chest pain: Secondary | ICD-10-CM | POA: Diagnosis not present

## 2017-09-05 DIAGNOSIS — S3991XA Unspecified injury of abdomen, initial encounter: Secondary | ICD-10-CM | POA: Diagnosis not present

## 2017-09-05 DIAGNOSIS — S0990XA Unspecified injury of head, initial encounter: Secondary | ICD-10-CM | POA: Insufficient documentation

## 2017-09-05 DIAGNOSIS — S299XXA Unspecified injury of thorax, initial encounter: Secondary | ICD-10-CM | POA: Diagnosis not present

## 2017-09-05 DIAGNOSIS — S199XXA Unspecified injury of neck, initial encounter: Secondary | ICD-10-CM | POA: Diagnosis not present

## 2017-09-05 DIAGNOSIS — R079 Chest pain, unspecified: Secondary | ICD-10-CM | POA: Diagnosis not present

## 2017-09-05 DIAGNOSIS — S79921A Unspecified injury of right thigh, initial encounter: Secondary | ICD-10-CM | POA: Diagnosis not present

## 2017-09-05 HISTORY — DX: Presence of other specified functional implants: Z96.89

## 2017-09-05 LAB — CBC
HEMATOCRIT: 37.8 % (ref 36.0–46.0)
HEMOGLOBIN: 12.6 g/dL (ref 12.0–15.0)
MCH: 32.3 pg (ref 26.0–34.0)
MCHC: 33.3 g/dL (ref 30.0–36.0)
MCV: 96.9 fL (ref 78.0–100.0)
Platelets: 249 10*3/uL (ref 150–400)
RBC: 3.9 MIL/uL (ref 3.87–5.11)
RDW: 13.9 % (ref 11.5–15.5)
WBC: 13 10*3/uL — ABNORMAL HIGH (ref 4.0–10.5)

## 2017-09-05 LAB — BASIC METABOLIC PANEL
Anion gap: 9 (ref 5–15)
BUN: 23 mg/dL — AB (ref 6–20)
CHLORIDE: 103 mmol/L (ref 101–111)
CO2: 24 mmol/L (ref 22–32)
CREATININE: 0.76 mg/dL (ref 0.44–1.00)
Calcium: 8.8 mg/dL — ABNORMAL LOW (ref 8.9–10.3)
GFR calc Af Amer: >60 mL/min (ref >60)
GFR calc non Af Amer: >60 mL/min (ref >60)
Glucose, Bld: 118 mg/dL — ABNORMAL HIGH (ref 65–99)
Potassium: 3.8 mmol/L (ref 3.5–5.1)
SODIUM: 136 mmol/L (ref 135–145)

## 2017-09-05 MED ORDER — IOPAMIDOL (ISOVUE-300) INJECTION 61%
100.0000 mL | Freq: Once | INTRAVENOUS | Status: AC | PRN
  Administered 2017-09-05: 100 mL via INTRAVENOUS

## 2017-09-05 MED ORDER — SODIUM CHLORIDE 0.9 % IV SOLN
INTRAVENOUS | Status: DC
  Administered 2017-09-05: 20:00:00 via INTRAVENOUS

## 2017-09-05 MED ORDER — HYDROMORPHONE HCL 1 MG/ML IJ SOLN
0.5000 mg | Freq: Once | INTRAMUSCULAR | Status: AC
  Administered 2017-09-05: 0.5 mg via INTRAVENOUS
  Filled 2017-09-05: qty 1

## 2017-09-05 NOTE — Discharge Instructions (Addendum)
Take the hydrocodone that you have at home as needed for pain.  Expect to be sore and stiff the next few days.  CT head neck chest abdomen pelvis without any acute findings.  X-rays of left shoulder and right thigh without any acute bony injuries.
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
## 2017-09-12 ENCOUNTER — Ambulatory Visit (INDEPENDENT_AMBULATORY_CARE_PROVIDER_SITE_OTHER): Payer: Medicare HMO | Admitting: Family Medicine

## 2017-09-12 DIAGNOSIS — E538 Deficiency of other specified B group vitamins: Secondary | ICD-10-CM

## 2017-09-12 MED ORDER — CYANOCOBALAMIN 1000 MCG/ML IJ SOLN
1000.0000 ug | Freq: Once | INTRAMUSCULAR | Status: AC
  Administered 2017-09-12: 1000 ug via INTRAMUSCULAR

## 2017-09-12 NOTE — Progress Notes (Signed)
Per orders of Dr. Jordan, injection of Vitamin B 12 given by NIMMONS, SYLVIA ANN. Patient tolerated injection well. 

## 2017-09-13 ENCOUNTER — Ambulatory Visit
Admission: RE | Admit: 2017-09-13 | Discharge: 2017-09-13 | Disposition: A | Discharge status: Home / Self Care | Payer: Medicare HMO | Source: Ambulatory Visit | Attending: Family Medicine | Admitting: Family Medicine

## 2017-09-13 DIAGNOSIS — R221 Localized swelling, mass and lump, neck: Secondary | ICD-10-CM | POA: Diagnosis not present

## 2017-09-13 DIAGNOSIS — R222 Localized swelling, mass and lump, trunk: Secondary | ICD-10-CM

## 2017-09-19 ENCOUNTER — Telehealth: Payer: Self-pay | Admitting: *Deleted

## 2017-09-19 ENCOUNTER — Ambulatory Visit (INDEPENDENT_AMBULATORY_CARE_PROVIDER_SITE_OTHER): Payer: Medicare HMO | Admitting: *Deleted

## 2017-09-19 DIAGNOSIS — E538 Deficiency of other specified B group vitamins: Secondary | ICD-10-CM | POA: Diagnosis not present

## 2017-09-19 MED ORDER — CYANOCOBALAMIN 1000 MCG/ML IJ SOLN
1000.0000 ug | Freq: Once | INTRAMUSCULAR | Status: AC
  Administered 2017-09-19: 1000 ug via INTRAMUSCULAR

## 2017-09-19 NOTE — Telephone Encounter (Signed)
Patient requesting [name] results from 09/13/17. Okay to leave detailed message if no answer.  Dr. [place], please advise. Thanks!

## 2017-09-19 NOTE — Progress Notes (Signed)
Per orders of Dr. Jordan, injection of B12 given by Emali Heyward J Velinda Wrobel. Patient tolerated injection well. 

## 2017-09-24 DIAGNOSIS — G894 Chronic pain syndrome: Secondary | ICD-10-CM | POA: Diagnosis not present

## 2017-09-24 DIAGNOSIS — M47812 Spondylosis without myelopathy or radiculopathy, cervical region: Secondary | ICD-10-CM | POA: Diagnosis not present

## 2017-09-24 DIAGNOSIS — M25512 Pain in left shoulder: Secondary | ICD-10-CM | POA: Diagnosis not present

## 2017-09-24 DIAGNOSIS — Z79899 Other long term (current) drug therapy: Secondary | ICD-10-CM | POA: Diagnosis not present

## 2017-09-24 DIAGNOSIS — Z79891 Long term (current) use of opiate analgesic: Secondary | ICD-10-CM | POA: Diagnosis not present

## 2017-09-24 DIAGNOSIS — M47816 Spondylosis without myelopathy or radiculopathy, lumbar region: Secondary | ICD-10-CM | POA: Diagnosis not present

## 2017-09-24 NOTE — Telephone Encounter (Signed)
Pt notified of results/instructions and verbalized understanding by CMA.

## 2017-10-22 DIAGNOSIS — Z124 Encounter for screening for malignant neoplasm of cervix: Secondary | ICD-10-CM | POA: Diagnosis not present

## 2017-10-22 DIAGNOSIS — Z6828 Body mass index (BMI) 28.0-28.9, adult: Secondary | ICD-10-CM | POA: Diagnosis not present

## 2017-10-22 DIAGNOSIS — Z01419 Encounter for gynecological examination (general) (routine) without abnormal findings: Secondary | ICD-10-CM | POA: Diagnosis not present

## 2017-10-22 DIAGNOSIS — Z1231 Encounter for screening mammogram for malignant neoplasm of breast: Secondary | ICD-10-CM | POA: Diagnosis not present

## 2017-10-22 DIAGNOSIS — R5383 Other fatigue: Secondary | ICD-10-CM | POA: Diagnosis not present

## 2017-10-23 DIAGNOSIS — Z79899 Other long term (current) drug therapy: Secondary | ICD-10-CM | POA: Diagnosis not present

## 2017-10-23 DIAGNOSIS — G894 Chronic pain syndrome: Secondary | ICD-10-CM | POA: Diagnosis not present

## 2017-10-23 DIAGNOSIS — M25512 Pain in left shoulder: Secondary | ICD-10-CM | POA: Diagnosis not present

## 2017-10-23 DIAGNOSIS — M47816 Spondylosis without myelopathy or radiculopathy, lumbar region: Secondary | ICD-10-CM | POA: Diagnosis not present

## 2017-10-23 DIAGNOSIS — M47812 Spondylosis without myelopathy or radiculopathy, cervical region: Secondary | ICD-10-CM | POA: Diagnosis not present

## 2017-10-23 DIAGNOSIS — Z79891 Long term (current) use of opiate analgesic: Secondary | ICD-10-CM | POA: Diagnosis not present

## 2017-10-25 DIAGNOSIS — M67911 Unspecified disorder of synovium and tendon, right shoulder: Secondary | ICD-10-CM | POA: Diagnosis not present

## 2017-10-25 DIAGNOSIS — M24812 Other specific joint derangements of left shoulder, not elsewhere classified: Secondary | ICD-10-CM | POA: Diagnosis not present

## 2017-10-25 DIAGNOSIS — M24811 Other specific joint derangements of right shoulder, not elsewhere classified: Secondary | ICD-10-CM | POA: Diagnosis not present

## 2017-10-25 DIAGNOSIS — M67912 Unspecified disorder of synovium and tendon, left shoulder: Secondary | ICD-10-CM | POA: Diagnosis not present

## 2017-10-31 ENCOUNTER — Ambulatory Visit: Payer: Medicare HMO | Attending: Orthopedic Surgery | Admitting: Physical Therapy

## 2017-10-31 ENCOUNTER — Other Ambulatory Visit: Payer: Self-pay

## 2017-10-31 ENCOUNTER — Encounter: Payer: Self-pay | Admitting: Physical Therapy

## 2017-10-31 DIAGNOSIS — M25511 Pain in right shoulder: Secondary | ICD-10-CM | POA: Diagnosis not present

## 2017-10-31 DIAGNOSIS — G8929 Other chronic pain: Secondary | ICD-10-CM | POA: Diagnosis not present

## 2017-10-31 DIAGNOSIS — M6281 Muscle weakness (generalized): Secondary | ICD-10-CM | POA: Diagnosis not present

## 2017-10-31 DIAGNOSIS — M542 Cervicalgia: Secondary | ICD-10-CM | POA: Diagnosis not present

## 2017-10-31 DIAGNOSIS — M25512 Pain in left shoulder: Secondary | ICD-10-CM | POA: Insufficient documentation

## 2017-10-31 NOTE — Therapy (Signed)
[facility] [place], [name], [address] Phone: [phone]   Fax:  [phone]  Physical Therapy Treatment  Patient Details  Name: Teresa Lee MRN: [id number] Date of Birth: 25-Sep-1960 Referring Provider: Gary Fleet PA-C   Encounter Date: 10/31/2017  PT End of Session - 10/31/17 1116    Visit Number  1    Number of Visits  8    Date for PT Re-Evaluation  12/05/17    Authorization Type  Progress note every 10th visit, KX modifier after 15 visits    PT Start Time  1035    PT Stop Time  1115    PT Time Calculation (min)  40 min    Activity Tolerance  Patient tolerated treatment well    Behavior During Therapy  [facility] for tasks assessed/performed       Past Medical History:  Diagnosis Date  . Arthritis    osteoarthritis  . Asthma   . Blind left eye   . Cancer ([place])    skin  . Crohn's disease ([place])   . CTS (carpal tunnel syndrome)   . DJD (degenerative joint disease)    Both cervical spine and LS spine  . Fibromyalgia   . GERD (gastroesophageal reflux disease)   . Heart murmur   . Herniated nucleus pulposus   . Hyperlipidemia   . Hypertension   . IBS (irritable bowel syndrome)   . Leukocytosis   . Myasthenia gravis   . Myasthenia gravis ([place])   . Spinal cord stimulator status     Past Surgical History:  Procedure Laterality Date  . BREAST REDUCTION SURGERY    . CARPAL TUNNEL RELEASE     bilateral  . cervical disc inj    . CHOLECYSTECTOMY    . ENDOMETRIAL ABLATION W/ NOVASURE    . MELANOMA EXCISION    . NASAL SINUS SURGERY    . THYMECTOMY     due to myastenia gravis  . THYMECTOMY      There were no vitals filed for this visit.  Subjective Assessment - 10/31/17 1848    Subjective  Patient arrives to physical therapy with ongoing bilateral shoulder pain (L>R). Patient reports getting injections in both shoulders on 10/25/17 to which she has noted improvements in pain. Patient reports  difficulties with sleeping on her side, lifting, and doing laundry. Patient reports pain at worst is a 6/10 and occurs with activities mentioned. Patient reports pain at best is 1/10 with pain medication and rest. Patient denies numbness and tingling and reports no difficulties with AROM. Patient's goals are to decrease pain, sleep undisturbed by pain, and improve strength.    Pertinent History  Internal stimulator, Myasthenia Gravis, Fibromyalgia, HTN, Asthma    Limitations  Lifting;House hold activities    How long can you sit comfortably?       How long can you walk comfortably?       Currently in Pain?  Yes    Pain Score  2     Pain Location  Shoulder    Pain Orientation  Right;Left    Pain Descriptors / Indicators  Sore;Discomfort    Pain Type  Chronic pain    Pain Onset  More than a month ago    Pain Frequency  Intermittent    Aggravating Factors   sleeping on sides, lifting, laundry    Pain Relieving Factors  pain medications, rest         [facility] PT Assessment - 10/31/17 0001  Assessment   Medical Diagnosis  bilateral shoulder impingement wiht AC joint arthritis    Referring Provider  Gary Fleet PA-C    Onset Date/Surgical Date  --   ongoing   Hand Dominance  Right    Next MD Visit  n/a    Prior Therapy  yes      Precautions   Precautions  None      Restrictions   Weight Bearing Restrictions  No      Balance Screen   Has the patient fallen in the past 6 months  No    Has the patient had a decrease in activity level because of a fear of falling?   Yes    Is the patient reluctant to leave their home because of a fear of falling?   No      Home Environment   Living Environment  Private residence    Living Arrangements  Spouse/significant other      Prior Function   Level of Independence  Independent    Vocation  On disability      Posture/Postural Control   Posture/Postural Control  Postural limitations    Postural Limitations  Rounded Shoulders;Forward  head      ROM / Strength   AROM / PROM / Strength  AROM;Strength      AROM   Overall AROM Comments  Bilataeral shoulders WFL with no reports of pain Right functional IR: T5, functional ER: T5; Left functional IR: T4, functional ER: T7    AROM Assessment Site  Shoulder      Strength   Strength Assessment Site  Shoulder    Right/Left Shoulder  Right;Left    Right Shoulder Flexion  4/5    Right Shoulder ABduction  4+/5    Right Shoulder Internal Rotation  4+/5    Right Shoulder External Rotation  3+/5    Left Shoulder Flexion  4+/5    Left Shoulder ABduction  4+/5    Left Shoulder Internal Rotation  4-/5    Left Shoulder External Rotation  4+/5      Palpation   Palpation comment  Tenderness to palpation to insertion point of left levator scapula and along left certical paraspinals      Special Tests    Special Tests  Rotator Cuff Impingement    Rotator Cuff Impingment tests  Michel Bickers test      Hawkins-Kennedy test   Findings  Negative    Side  Left    Comments  and right                                PT Long Term Goals - 10/31/17 1935      PT LONG TERM GOAL #1   Title  Patient will be independent with HEP    Time  4      PT LONG TERM GOAL #2   Title  Patient will report ability to sleep throughout the night with less than 2/10 bilateral shoudler pain for a restful night's sleep.    Time  4    Period  Weeks    Status  New      PT LONG TERM GOAL #3   Title  Patient will be able to report the ability to perform ADL's with pain less than 3/10 in bilateral shoulders    Time  4    Period  Weeks    Status  New  PT LONG TERM GOAL #4   Title  Patient will demonstrate 4+/5 or greater bilateral shoulder strength in all planes to improve stability during functional activities.    Time  4    Period  Weeks    Status  New      PT LONG TERM GOAL #5   Title  --    Time  --    Period  --    Status  --            Plan - 10/31/17  1922    Clinical Impression Statement  Patient is a 57 year old female who presents to physical therapy with bilateral shoulder pain. Patient noted with decreased bilateral shoulder strength. Patient (-) for [employer] test bilaterally. Patient tender to palpation at insertion of L levator scapulae and along L cervical paraspinals. Patient noted with rounded shoulders and forward head. Patient would benefit from skilled physical therapy to decrease pain, address deficits, and address patient's goals.    History and Personal Factors relevant to plan of care:  HTN, myasthenia gravis, Fibromyalgia, Asthma    Clinical Presentation  Stable    Clinical Decision Making  Moderate    Rehab Potential  Good    PT Frequency  2x / week    PT Duration  4 weeks    PT Treatment/Interventions  ADLs/Self Care Home Management;Dry needling;Taping;Therapeutic activities;Therapeutic exercise;Functional mobility training;Patient/family education;Manual techniques;Passive range of motion;Moist Heat;Neuromuscular re-education;Iontophoresis 4mg /ml Dexamethasone       Patient will benefit from skilled therapeutic intervention in order to improve the following deficits and impairments:  Pain, Postural dysfunction, Decreased mobility, Decreased activity tolerance, Decreased strength, Impaired UE functional use  Visit Diagnosis: Chronic left shoulder pain  Chronic right shoulder pain  Muscle weakness (generalized)     Problem List Patient Active Problem List   Diagnosis Date Noted  . Fibromyalgia syndrome 04/30/2017  . RLS (restless legs syndrome) 12/26/2016  . B12 deficiency 12/26/2016  . Vitamin D deficiency, unspecified 12/26/2016  . GERD (gastroesophageal reflux disease) 12/26/2016  . Anxiety disorder 12/26/2016  . Hyponatremia 10/24/2016  . AKI (acute kidney injury) ([place]) 10/23/2016  . Hypomagnesemia 10/22/2016  . Hypophosphatemia 10/22/2016  . Hypokalemia 10/21/2016  . Wound infection after  surgery 10/21/2016  . Sepsis ([place]) 10/21/2016  . Myasthenia gravis ([place]) 10/21/2016  . Melanoma ([place]) 10/21/2016  . Crohn disease ([place]) 10/21/2016  . Cellulitis 10/21/2016  . Palpitations 08/25/2013  . Leukocytosis 05/25/2011  . Hyperlipidemia 04/20/2007  . Essential hypertension 04/20/2007  . Asthma 04/20/2007  . CARPAL TUNNEL SYNDROME, HX OF 04/20/2007   Gabriela Eves, PT, DPT 10/31/2017, 7:47 PM  [facility] [address] [place], [name], [address] Phone: [phone]   Fax:  [phone]  Name: Teresa Lee MRN: [id number] Date of Birth: 1960/08/19

## 2017-11-06 ENCOUNTER — Ambulatory Visit: Payer: Medicare HMO | Admitting: Physical Therapy

## 2017-11-06 ENCOUNTER — Encounter: Payer: Self-pay | Admitting: Physical Therapy

## 2017-11-06 DIAGNOSIS — M25512 Pain in left shoulder: Secondary | ICD-10-CM | POA: Diagnosis not present

## 2017-11-06 DIAGNOSIS — M25511 Pain in right shoulder: Secondary | ICD-10-CM | POA: Diagnosis not present

## 2017-11-06 DIAGNOSIS — M542 Cervicalgia: Secondary | ICD-10-CM | POA: Diagnosis not present

## 2017-11-06 DIAGNOSIS — G8929 Other chronic pain: Secondary | ICD-10-CM

## 2017-11-06 DIAGNOSIS — M6281 Muscle weakness (generalized): Secondary | ICD-10-CM

## 2017-11-06 NOTE — Therapy (Signed)
[facility] [place], [name], [address] Phone: [phone]   Fax:  [phone]  Physical Therapy Treatment  Patient Details  Name: Teresa Lee MRN: [id number] Date of Birth: October 31, 1960 Referring Provider: Gary Fleet PA-C   Encounter Date: 11/06/2017  PT End of Session - 11/06/17 1257    Visit Number  2    Number of Visits  8    Date for PT Re-Evaluation  12/05/17    Authorization Type  Progress note every 10th visit, KX modifier after 15 visits    PT Start Time  1030    PT Stop Time  1125    PT Time Calculation (min)  55 min    Activity Tolerance  Patient tolerated treatment well    Behavior During Therapy  [facility] for tasks assessed/performed       Past Medical History:  Diagnosis Date  . Arthritis    osteoarthritis  . Asthma   . Blind left eye   . Cancer ([place])    skin  . Crohn's disease ([place])   . CTS (carpal tunnel syndrome)   . DJD (degenerative joint disease)    Both cervical spine and LS spine  . Fibromyalgia   . GERD (gastroesophageal reflux disease)   . Heart murmur   . Herniated nucleus pulposus   . Hyperlipidemia   . Hypertension   . IBS (irritable bowel syndrome)   . Leukocytosis   . Myasthenia gravis   . Myasthenia gravis ([place])   . Spinal cord stimulator status     Past Surgical History:  Procedure Laterality Date  . BREAST REDUCTION SURGERY    . CARPAL TUNNEL RELEASE     bilateral  . cervical disc inj    . CHOLECYSTECTOMY    . ENDOMETRIAL ABLATION W/ NOVASURE    . MELANOMA EXCISION    . NASAL SINUS SURGERY    . THYMECTOMY     due to myastenia gravis  . THYMECTOMY      There were no vitals filed for this visit.  Subjective Assessment - 11/06/17 1115    Subjective  Both shoulders hurt but probably the left a little more.    Currently in Pain?  Yes    Pain Score  5     Pain Location  Shoulder    Pain Orientation  Right;Left    Pain Onset  More than a month ago                        [facility] Adult PT Treatment/Exercise - 11/06/17 0001      Exercises   Exercises  Shoulder      Shoulder Exercises: Standing   Other Standing Exercises  Red Theraband IR/ER to fatigue to both shoulders.      Shoulder Exercises: Pulleys   Flexion Limitations  4 minutes.      Shoulder Exercises: ROM/Strengthening   UBE (Upper Arm Bike)  8 minutes at 120 RPM's (4 minutes forward and 4 minutes backward).      Modalities   Modalities  Electrical Stimulation;Moist Heat      Moist Heat Therapy   Number Minutes Moist Heat  20 Minutes    Moist Heat Location  --   Bilateral shoulders/UT's.     Acupuncturist Location  Bilateral UT's/Levator scapulae.    Electrical Stimulation Action  IFC    Electrical Stimulation Parameters  80-150 Hz x 20 minutes.  Electrical Stimulation Goals  Tone;Pain      Manual Therapy   Manual Therapy  Soft tissue mobilization    Soft tissue mobilization  STW/M x 6 minutes to left levator scapulae and medial scapular border region to decrease tone.             PT Education - 11/06/17 1228    Education Details  RW4 with red theraband.  Red theraband provided for HEP.    Person(s) Educated  Patient    Methods  Explanation;Demonstration;Tactile cues;Verbal cues;Handout    Comprehension  Verbalized understanding;Returned demonstration;Verbal cues required;Tactile cues required;Need further instruction          PT Long Term Goals - 10/31/17 1935      PT LONG TERM GOAL #1   Title  Patient will be independent with HEP    Time  4      PT LONG TERM GOAL #2   Title  Patient will report ability to sleep throughout the night with less than 2/10 bilateral shoudler pain for a restful night's sleep.    Time  4    Period  Weeks    Status  New      PT LONG TERM GOAL #3   Title  Patient will be able to report the ability to perform ADL's with pain less than 3/10 in bilateral shoulders     Time  4    Period  Weeks    Status  New      PT LONG TERM GOAL #4   Title  Patient will demonstrate 4+/5 or greater bilateral shoulder strength in all planes to improve stability during functional activities.    Time  4    Period  Weeks    Status  New      PT LONG TERM GOAL #5   Title  --    Time  --    Period  --    Status  --            Plan - 11/06/17 1232    Clinical Impression Statement  The patient did well with treatment today.  She was found to have a trigger point in her left Levator Scapulae that responded well to STW/M and ischemic release technique.  She did well with theraband IR/ER for bilateral shoulders but required verbal and tactile cues to correct technique.    PT Treatment/Interventions  ADLs/Self Care Home Management;Dry needling;Taping;Therapeutic activities;Therapeutic exercise;Functional mobility training;Patient/family education;Manual techniques;Passive range of motion;Moist Heat;Neuromuscular re-education;Iontophoresis 44m/ml Dexamethasone    Consulted and Agree with Plan of Care  Patient       Patient will benefit from skilled therapeutic intervention in order to improve the following deficits and impairments:  Pain, Postural dysfunction, Decreased mobility, Decreased activity tolerance, Decreased strength, Impaired UE functional use  Visit Diagnosis: Chronic left shoulder pain  Chronic right shoulder pain  Muscle weakness (generalized)  Cervicalgia     Problem List Patient Active Problem List   Diagnosis Date Noted  . Fibromyalgia syndrome 04/30/2017  . RLS (restless legs syndrome) 12/26/2016  . B12 deficiency 12/26/2016  . Vitamin D deficiency, unspecified 12/26/2016  . GERD (gastroesophageal reflux disease) 12/26/2016  . Anxiety disorder 12/26/2016  . Hyponatremia 10/24/2016  . AKI (acute kidney injury) ([place] 10/23/2016  . Hypomagnesemia 10/22/2016  . Hypophosphatemia 10/22/2016  . Hypokalemia 10/21/2016  . Wound infection after  surgery 10/21/2016  . Sepsis ([place] 10/21/2016  . Myasthenia gravis ([place] 10/21/2016  . Melanoma ([place] 10/21/2016  . Crohn disease (  [place]) 10/21/2016  . Cellulitis 10/21/2016  . Palpitations 08/25/2013  . Leukocytosis 05/25/2011  . Hyperlipidemia 04/20/2007  . Essential hypertension 04/20/2007  . Asthma 04/20/2007  . CARPAL TUNNEL SYNDROME, HX OF 04/20/2007    APPLEGATE, [place] MPT 11/06/2017, 12:57 PM  [facility] [address] [place], [name], [address] Phone: [phone]   Fax:  [phone]  Name: Teresa Lee MRN: [id number] Date of Birth: 02-09-1961

## 2017-11-08 ENCOUNTER — Encounter: Payer: Self-pay | Admitting: Physical Therapy

## 2017-11-08 ENCOUNTER — Ambulatory Visit: Payer: Medicare HMO | Admitting: Physical Therapy

## 2017-11-08 DIAGNOSIS — M542 Cervicalgia: Secondary | ICD-10-CM | POA: Diagnosis not present

## 2017-11-08 DIAGNOSIS — G8929 Other chronic pain: Secondary | ICD-10-CM

## 2017-11-08 DIAGNOSIS — M25512 Pain in left shoulder: Principal | ICD-10-CM

## 2017-11-08 DIAGNOSIS — M6281 Muscle weakness (generalized): Secondary | ICD-10-CM

## 2017-11-08 DIAGNOSIS — M25511 Pain in right shoulder: Secondary | ICD-10-CM

## 2017-11-08 NOTE — Therapy (Signed)
[facility] [place], [name], [address] Phone: [phone]   Fax:  [phone]  Physical Therapy Treatment  Patient Details  Name: Teresa Lee MRN: [id number] Date of Birth: 03/05/1961 Referring Provider: Gary Fleet PA-C   Encounter Date: 11/08/2017  PT End of Session - 11/08/17 1033    Visit Number  3    Number of Visits  8    Date for PT Re-Evaluation  12/05/17    Authorization Type  Progress note every 10th visit, KX modifier after 15 visits    PT Start Time  1032    PT Stop Time  1128    PT Time Calculation (min)  56 min    Activity Tolerance  Patient tolerated treatment well    Behavior During Therapy  [facility] for tasks assessed/performed       Past Medical History:  Diagnosis Date  . Arthritis    osteoarthritis  . Asthma   . Blind left eye   . Cancer ([place])    skin  . Crohn's disease ([place])   . CTS (carpal tunnel syndrome)   . DJD (degenerative joint disease)    Both cervical spine and LS spine  . Fibromyalgia   . GERD (gastroesophageal reflux disease)   . Heart murmur   . Herniated nucleus pulposus   . Hyperlipidemia   . Hypertension   . IBS (irritable bowel syndrome)   . Leukocytosis   . Myasthenia gravis   . Myasthenia gravis ([place])   . Spinal cord stimulator status     Past Surgical History:  Procedure Laterality Date  . BREAST REDUCTION SURGERY    . CARPAL TUNNEL RELEASE     bilateral  . cervical disc inj    . CHOLECYSTECTOMY    . ENDOMETRIAL ABLATION W/ NOVASURE    . MELANOMA EXCISION    . NASAL SINUS SURGERY    . THYMECTOMY     due to myastenia gravis  . THYMECTOMY      There were no vitals filed for this visit.  Subjective Assessment - 11/08/17 1035    Subjective  Reports last session worked her shoulder out pretty good but reports shoulders are feeling good.    Pertinent History  Internal stimulator, Myasthenia Gravis, Fibromyalgia, HTN, Asthma    Limitations   Lifting;House hold activities    Currently in Pain?  Yes    Pain Score  2     Pain Location  Shoulder    Pain Orientation  Right;Left    Pain Descriptors / Indicators  Sore    Pain Type  Chronic pain    Pain Onset  More than a month ago    Pain Frequency  Intermittent         [facility] PT Assessment - 11/08/17 0001      Assessment   Medical Diagnosis  bilateral shoulder impingement with AC joint arthritis                   [facility] Adult PT Treatment/Exercise - 11/08/17 0001      Exercises   Exercises  Shoulder      Shoulder Exercises: Standing   Protraction  Strengthening;20 reps;Theraband;Both    Theraband Level (Shoulder Protraction)  Level 2 (Red)    External Rotation  Strengthening;Both;20 reps;Theraband    Theraband Level (Shoulder External Rotation)  Level 2 (Red)    Internal Rotation  Strengthening;Both;20 reps;Theraband    Theraband Level (Shoulder Internal Rotation)  Level 2 (Red)  Extension  --    Theraband Level (Shoulder Extension)  --    Row  Strengthening;Both;20 reps;Theraband    Theraband Level (Shoulder Row)  Level 2 (Red)      Shoulder Exercises: Pulleys   Flexion  5 minutes      Shoulder Exercises: ROM/Strengthening   UBE (Upper Arm Bike)  8 minutes at 120 RPM's (4 minutes forward and 4 minutes backward).      Modalities   Modalities  Electrical Stimulation;Moist Heat      Moist Heat Therapy   Number Minutes Moist Heat  20 Minutes    Moist Heat Location  Shoulder      Electrical Stimulation   Electrical Stimulation Location  Bilateral UT's/Levator scapulae.    Electrical Stimulation Action  IFC    Electrical Stimulation Parameters  80-150 hz x15 min    Electrical Stimulation Goals  Tone;Pain      Manual Therapy   Manual Therapy  Soft tissue mobilization    Soft tissue mobilization  STW/M x 8 minutes to left levator scapulae and medial scapular border region to decrease tone.                  PT Long Term Goals -  10/31/17 1935      PT LONG TERM GOAL #1   Title  Patient will be independent with HEP    Time  4      PT LONG TERM GOAL #2   Title  Patient will report ability to sleep throughout the night with less than 2/10 bilateral shoudler pain for a restful night's sleep.    Time  4    Period  Weeks    Status  New      PT LONG TERM GOAL #3   Title  Patient will be able to report the ability to perform ADL's with pain less than 3/10 in bilateral shoulders    Time  4    Period  Weeks    Status  New      PT LONG TERM GOAL #4   Title  Patient will demonstrate 4+/5 or greater bilateral shoulder strength in all planes to improve stability during functional activities.    Time  4    Period  Weeks    Status  New      PT LONG TERM GOAL #5   Title  --    Time  --    Period  --    Status  --            Plan - 11/08/17 1117    Clinical Impression Statement  Patient was able to tolerate treatment well with some reports of muscle fatigue with RW4 exercise. Patient required verbal and tactile cues for proper form and was able to demonstrate for remainder of exercise. Patient noted with increased muscle tenderness with deep palpation along L medial border of the scapula; patient reported a decrease in pain after STW/M. Normal response to modalities at end of session. Patient educated she may be more sore after today's session since performing new exercises and to ice or heat shoulders for pain relief. Patient reported understanding.     Clinical Presentation  Stable    Clinical Decision Making  Moderate    Rehab Potential  Good    PT Frequency  2x / week    PT Duration  4 weeks    PT Treatment/Interventions  ADLs/Self Care Home Management;Dry needling;Taping;Therapeutic activities;Therapeutic exercise;Functional mobility training;Patient/family education;Manual techniques;Passive range  of motion;Moist Heat;Neuromuscular re-education;Iontophoresis 4mg /ml Dexamethasone;Electrical  Stimulation;Cryotherapy;Ultrasound    PT Next Visit Plan  STW/M to UT, insertion point of levator scap, and medial border of scapula, gentle strengthening bilaterally;     Consulted and Agree with Plan of Care  Patient       Patient will benefit from skilled therapeutic intervention in order to improve the following deficits and impairments:  Pain, Postural dysfunction, Decreased mobility, Decreased activity tolerance, Decreased strength, Impaired UE functional use  Visit Diagnosis: Chronic left shoulder pain - Plan: PT plan of care cert/re-cert  Chronic right shoulder pain - Plan: PT plan of care cert/re-cert  Muscle weakness (generalized) - Plan: PT plan of care cert/re-cert     Problem List Patient Active Problem List   Diagnosis Date Noted  . Fibromyalgia syndrome 04/30/2017  . RLS (restless legs syndrome) 12/26/2016  . B12 deficiency 12/26/2016  . Vitamin D deficiency, unspecified 12/26/2016  . GERD (gastroesophageal reflux disease) 12/26/2016  . Anxiety disorder 12/26/2016  . Hyponatremia 10/24/2016  . AKI (acute kidney injury) ([place]) 10/23/2016  . Hypomagnesemia 10/22/2016  . Hypophosphatemia 10/22/2016  . Hypokalemia 10/21/2016  . Wound infection after surgery 10/21/2016  . Sepsis ([place]) 10/21/2016  . Myasthenia gravis ([place]) 10/21/2016  . Melanoma ([place]) 10/21/2016  . Crohn disease ([place]) 10/21/2016  . Cellulitis 10/21/2016  . Palpitations 08/25/2013  . Leukocytosis 05/25/2011  . Hyperlipidemia 04/20/2007  . Essential hypertension 04/20/2007  . Asthma 04/20/2007  . CARPAL TUNNEL SYNDROME, HX OF 04/20/2007   Gabriela Eves, PT, DPT 11/08/2017, 12:56 PM  [facility] [address] [place], [name], [address] Phone: [phone]   Fax:  [phone]  Name: Kyanna Mahrt MRN: [id number] Date of Birth: November 15, 1960

## 2017-11-13 ENCOUNTER — Encounter: Payer: Self-pay | Admitting: Physical Therapy

## 2017-11-13 ENCOUNTER — Ambulatory Visit: Payer: Medicare HMO | Admitting: Physical Therapy

## 2017-11-13 DIAGNOSIS — M25511 Pain in right shoulder: Secondary | ICD-10-CM

## 2017-11-13 DIAGNOSIS — M542 Cervicalgia: Secondary | ICD-10-CM | POA: Diagnosis not present

## 2017-11-13 DIAGNOSIS — M25512 Pain in left shoulder: Secondary | ICD-10-CM | POA: Diagnosis not present

## 2017-11-13 DIAGNOSIS — M6281 Muscle weakness (generalized): Secondary | ICD-10-CM | POA: Diagnosis not present

## 2017-11-13 DIAGNOSIS — G8929 Other chronic pain: Secondary | ICD-10-CM | POA: Diagnosis not present

## 2017-11-13 NOTE — Therapy (Signed)
[facility] [place], [name], [address] Phone: [phone]   Fax:  [phone]  Physical Therapy Treatment  Patient Details  Name: Teresa Lee MRN: [id number] Date of Birth: [birth year] Referring Provider: Gary Fleet PA-C   Encounter Date: 11/13/2017  PT End of Session - 11/13/17 1125    Visit Number  4    Number of Visits  8    Date for PT Re-Evaluation  12/05/17    Authorization Type  Progress note every 10th visit, KX modifier after 15 visits    PT Start Time  0946    PT Stop Time  1036    PT Time Calculation (min)  50 min    Activity Tolerance  Patient tolerated treatment well    Behavior During Therapy  [facility] for tasks assessed/performed       Past Medical History:  Diagnosis Date  . Arthritis    osteoarthritis  . Asthma   . Blind left eye   . Cancer ([place])    skin  . Crohn's disease ([place])   . CTS (carpal tunnel syndrome)   . DJD (degenerative joint disease)    Both cervical spine and LS spine  . Fibromyalgia   . GERD (gastroesophageal reflux disease)   . Heart murmur   . Herniated nucleus pulposus   . Hyperlipidemia   . Hypertension   . IBS (irritable bowel syndrome)   . Leukocytosis   . Myasthenia gravis   . Myasthenia gravis ([place])   . Spinal cord stimulator status     Past Surgical History:  Procedure Laterality Date  . BREAST REDUCTION SURGERY    . CARPAL TUNNEL RELEASE     bilateral  . cervical disc inj    . CHOLECYSTECTOMY    . ENDOMETRIAL ABLATION W/ NOVASURE    . MELANOMA EXCISION    . NASAL SINUS SURGERY    . THYMECTOMY     due to myastenia gravis  . THYMECTOMY      There were no vitals filed for this visit.  Subjective Assessment - 11/13/17 1028    Subjective  Still sore after last treatment especially on the left.    Currently in Pain?  Yes    Pain Score  4     Pain Location  Shoulder    Pain Orientation  Right;Left    Pain Descriptors / Indicators  Sore    Pain  Type  Chronic pain    Pain Onset  More than a month ago                       [facility] Adult PT Treatment/Exercise - 11/13/17 0001      Exercises   Exercises  Shoulder      Shoulder Exercises: Standing   Other Standing Exercises  Red theraband bilateral shoulder IR/ER to fatigue.      Shoulder Exercises: Pulleys   Flexion  --   5 minutes.     Shoulder Exercises: ROM/Strengthening   UBE (Upper Arm Bike)  8 minutes at 90 RPM's (4 minutes forward and 4 minutes backward).      Modalities   Modalities  Electrical Stimulation;Moist Heat      Moist Heat Therapy   Number Minutes Moist Heat  20 Minutes    Moist Heat Location  Shoulder      Electrical Stimulation   Electrical Stimulation Location  Bilateral UT's    Electrical Stimulation Action  Pre-mod. to left medial  scapular border and right UT.    Electrical Stimulation Parameters  80-150 Hz x 20 minutes.      Manual Therapy   Manual Therapy  Soft tissue mobilization    Soft tissue mobilization  STW/M to left medial scapular musculature x 5 minutes.               PT Short Term Goals - 11/08/17 1814      PT SHORT TERM GOAL #1   Title  STG=LTG        PT Long Term Goals - 10/31/17 1935      PT LONG TERM GOAL #1   Title  Patient will be independent with HEP    Time  4      PT LONG TERM GOAL #2   Title  Patient will report ability to sleep throughout the night with less than 2/10 bilateral shoudler pain for a restful night's sleep.    Time  4    Period  Weeks    Status  New      PT LONG TERM GOAL #3   Title  Patient will be able to report the ability to perform ADL's with pain less than 3/10 in bilateral shoulders    Time  4    Period  Weeks    Status  New      PT LONG TERM GOAL #4   Title  Patient will demonstrate 4+/5 or greater bilateral shoulder strength in all planes to improve stability during functional activities.    Time  4    Period  Weeks    Status  New      PT LONG TERM GOAL  #5   Title  --    Time  --    Period  --    Status  --            Plan - 11/13/17 1047    Clinical Impression Statement  Patient, overall is doing well.  She was quite tender to palpation along the musculature near her left medial scapular border.    PT Treatment/Interventions  ADLs/Self Care Home Management;Dry needling;Taping;Therapeutic activities;Therapeutic exercise;Functional mobility training;Patient/family education;Manual techniques;Passive range of motion;Moist Heat;Neuromuscular re-education;Iontophoresis 4mg /ml Dexamethasone;Electrical Stimulation;Cryotherapy;Ultrasound    PT Next Visit Plan  STW/M to UT, insertion point of levator scap, and medial border of scapula, gentle strengthening bilaterally;     Consulted and Agree with Plan of Care  Patient       Patient will benefit from skilled therapeutic intervention in order to improve the following deficits and impairments:  Pain, Postural dysfunction, Decreased mobility, Decreased activity tolerance, Decreased strength, Impaired UE functional use  Visit Diagnosis: Chronic left shoulder pain  Chronic right shoulder pain  Muscle weakness (generalized)  Cervicalgia     Problem List Patient Active Problem List   Diagnosis Date Noted  . Fibromyalgia syndrome 04/30/2017  . RLS (restless legs syndrome) 12/26/2016  . B12 deficiency 12/26/2016  . Vitamin D deficiency, unspecified 12/26/2016  . GERD (gastroesophageal reflux disease) 12/26/2016  . Anxiety disorder 12/26/2016  . Hyponatremia 10/24/2016  . AKI (acute kidney injury) ([place]) 10/23/2016  . Hypomagnesemia 10/22/2016  . Hypophosphatemia 10/22/2016  . Hypokalemia 10/21/2016  . Wound infection after surgery 10/21/2016  . Sepsis ([place]) 10/21/2016  . Myasthenia gravis ([place]) 10/21/2016  . Melanoma ([place]) 10/21/2016  . Crohn disease ([place]) 10/21/2016  . Cellulitis 10/21/2016  . Palpitations 08/25/2013  . Leukocytosis 05/25/2011  . Hyperlipidemia 04/20/2007  .  Essential hypertension 04/20/2007  .  Asthma 04/20/2007  . CARPAL TUNNEL SYNDROME, HX OF 04/20/2007    Tomorrow Dehaas, [place] MPT 11/13/2017, 11:27 AM  [facility] [address] [place], [name], [address] Phone: [phone]   Fax:  [phone]  Name: Teresa Lee MRN: [id number] Date of Birth: [birth year]

## 2017-11-15 ENCOUNTER — Ambulatory Visit: Payer: Medicare HMO | Admitting: Physical Therapy

## 2017-11-15 ENCOUNTER — Encounter: Payer: Self-pay | Admitting: Physical Therapy

## 2017-11-15 DIAGNOSIS — M25512 Pain in left shoulder: Principal | ICD-10-CM

## 2017-11-15 DIAGNOSIS — M6281 Muscle weakness (generalized): Secondary | ICD-10-CM

## 2017-11-15 DIAGNOSIS — M542 Cervicalgia: Secondary | ICD-10-CM | POA: Diagnosis not present

## 2017-11-15 DIAGNOSIS — G8929 Other chronic pain: Secondary | ICD-10-CM | POA: Diagnosis not present

## 2017-11-15 DIAGNOSIS — M25511 Pain in right shoulder: Secondary | ICD-10-CM

## 2017-11-15 NOTE — Therapy (Signed)
[facility] [place], [name], [address] Phone: [phone]   Fax:  [phone]  Physical Therapy Treatment  Patient Details  Name: Teresa Lee MRN: [id number] Date of Birth: Jul 18, 1960 Referring Provider: Gary Fleet PA-C   Encounter Date: 11/15/2017  PT End of Session - 11/15/17 1147    Visit Number  5    Number of Visits  8    Date for PT Re-Evaluation  12/05/17    Authorization Type  Progress note every 10th visit, KX modifier after 15 visits    PT Start Time  1032    PT Stop Time  1122    PT Time Calculation (min)  50 min    Activity Tolerance  Patient tolerated treatment well    Behavior During Therapy  [facility] for tasks assessed/performed       Past Medical History:  Diagnosis Date  . Arthritis    osteoarthritis  . Asthma   . Blind left eye   . Cancer ([place])    skin  . Crohn's disease ([place])   . CTS (carpal tunnel syndrome)   . DJD (degenerative joint disease)    Both cervical spine and LS spine  . Fibromyalgia   . GERD (gastroesophageal reflux disease)   . Heart murmur   . Herniated nucleus pulposus   . Hyperlipidemia   . Hypertension   . IBS (irritable bowel syndrome)   . Leukocytosis   . Myasthenia gravis   . Myasthenia gravis ([place])   . Spinal cord stimulator status     Past Surgical History:  Procedure Laterality Date  . BREAST REDUCTION SURGERY    . CARPAL TUNNEL RELEASE     bilateral  . cervical disc inj    . CHOLECYSTECTOMY    . ENDOMETRIAL ABLATION W/ NOVASURE    . MELANOMA EXCISION    . NASAL SINUS SURGERY    . THYMECTOMY     due to myastenia gravis  . THYMECTOMY      There were no vitals filed for this visit.  Subjective Assessment - 11/15/17 1206    Subjective  Treatments have really been helping.  CC is left shoulder blade pain.    Currently in Pain?  Yes    Pain Score  3     Pain Location  Shoulder    Pain Orientation  Left;Right    Pain Descriptors / Indicators   Sore    Pain Type  Chronic pain    Pain Onset  More than a month ago                       [facility] Adult PT Treatment/Exercise - 11/15/17 0001      Exercises   Exercises  Shoulder      Shoulder Exercises: Standing   Other Standing Exercises  Red theraband to fatigue into bilateral ER x 3 minutes.      Shoulder Exercises: Pulleys   Flexion  --   4 minutes.     Shoulder Exercises: ROM/Strengthening   UBE (Upper Arm Bike)  8 minutes at 90 RPM's.      Modalities   Modalities  Electrical Stimulation;Moist Heat      Moist Heat Therapy   Number Minutes Moist Heat  20 Minutes    Moist Heat Location  --   Left scapular region.     Acupuncturist Location  Left scapular region.    Chartered certified accountant  IFC    Electrical Stimulation Parameters  80-150 Hz x 20 minutes.    Electrical Stimulation Goals  Tone;Pain      Manual Therapy   Manual Therapy  Soft tissue mobilization    Soft tissue mobilization  STW/M x 10 minutes. to left scapular region near medial border.               PT Short Term Goals - 11/08/17 1814      PT SHORT TERM GOAL #1   Title  STG=LTG        PT Long Term Goals - 10/31/17 1935      PT LONG TERM GOAL #1   Title  Patient will be independent with HEP    Time  4      PT LONG TERM GOAL #2   Title  Patient will report ability to sleep throughout the night with less than 2/10 bilateral shoudler pain for a restful night's sleep.    Time  4    Period  Weeks    Status  New      PT LONG TERM GOAL #3   Title  Patient will be able to report the ability to perform ADL's with pain less than 3/10 in bilateral shoulders    Time  4    Period  Weeks    Status  New      PT LONG TERM GOAL #4   Title  Patient will demonstrate 4+/5 or greater bilateral shoulder strength in all planes to improve stability during functional activities.    Time  4    Period  Weeks    Status  New      PT LONG TERM  GOAL #5   Title  --    Time  --    Period  --    Status  --            Plan - 11/15/17 1226    Clinical Impression Statement  Patient is responding very well to treatments.  her pain was more localized today to the left medial scapular border.  She felt much better after trigger point release technique to the area.    PT Treatment/Interventions  ADLs/Self Care Home Management;Dry needling;Taping;Therapeutic activities;Therapeutic exercise;Functional mobility training;Patient/family education;Manual techniques;Passive range of motion;Moist Heat;Neuromuscular re-education;Iontophoresis 4mg /ml Dexamethasone;Electrical Stimulation;Cryotherapy;Ultrasound    PT Next Visit Plan  STW/M to UT, insertion point of levator scap, and medial border of scapula, gentle strengthening bilaterally;     Consulted and Agree with Plan of Care  Patient       Patient will benefit from skilled therapeutic intervention in order to improve the following deficits and impairments:  Pain, Postural dysfunction, Decreased mobility, Decreased activity tolerance, Decreased strength, Impaired UE functional use  Visit Diagnosis: Chronic left shoulder pain  Chronic right shoulder pain  Muscle weakness (generalized)  Cervicalgia     Problem List Patient Active Problem List   Diagnosis Date Noted  . Fibromyalgia syndrome 04/30/2017  . RLS (restless legs syndrome) 12/26/2016  . B12 deficiency 12/26/2016  . Vitamin D deficiency, unspecified 12/26/2016  . GERD (gastroesophageal reflux disease) 12/26/2016  . Anxiety disorder 12/26/2016  . Hyponatremia 10/24/2016  . AKI (acute kidney injury) ([place]) 10/23/2016  . Hypomagnesemia 10/22/2016  . Hypophosphatemia 10/22/2016  . Hypokalemia 10/21/2016  . Wound infection after surgery 10/21/2016  . Sepsis ([place]) 10/21/2016  . Myasthenia gravis ([place]) 10/21/2016  . Melanoma ([place]) 10/21/2016  . Crohn disease ([place]) 10/21/2016  . Cellulitis 10/21/2016  .  Palpitations  08/25/2013  . Leukocytosis 05/25/2011  . Hyperlipidemia 04/20/2007  . Essential hypertension 04/20/2007  . Asthma 04/20/2007  . CARPAL TUNNEL SYNDROME, HX OF 04/20/2007    Jla Reynolds, [place] MPT 11/15/2017, 12:28 PM  [facility] [address] [place], [name], [address] Phone: [phone]   Fax:  [phone]  Name: Teresa Lee MRN: [id number] Date of Birth: 1960/12/03

## 2017-11-16 ENCOUNTER — Other Ambulatory Visit: Payer: Self-pay | Admitting: Family Medicine

## 2017-11-16 DIAGNOSIS — M797 Fibromyalgia: Secondary | ICD-10-CM

## 2017-11-20 ENCOUNTER — Encounter: Payer: Medicare HMO | Admitting: *Deleted

## 2017-11-21 DIAGNOSIS — M25512 Pain in left shoulder: Secondary | ICD-10-CM | POA: Diagnosis not present

## 2017-11-21 DIAGNOSIS — G894 Chronic pain syndrome: Secondary | ICD-10-CM | POA: Diagnosis not present

## 2017-11-21 DIAGNOSIS — M47812 Spondylosis without myelopathy or radiculopathy, cervical region: Secondary | ICD-10-CM | POA: Diagnosis not present

## 2017-11-21 DIAGNOSIS — M47816 Spondylosis without myelopathy or radiculopathy, lumbar region: Secondary | ICD-10-CM | POA: Diagnosis not present

## 2017-11-22 DIAGNOSIS — Z85820 Personal history of malignant melanoma of skin: Secondary | ICD-10-CM | POA: Diagnosis not present

## 2017-11-22 DIAGNOSIS — L821 Other seborrheic keratosis: Secondary | ICD-10-CM | POA: Diagnosis not present

## 2017-11-22 DIAGNOSIS — L718 Other rosacea: Secondary | ICD-10-CM | POA: Diagnosis not present

## 2017-12-05 ENCOUNTER — Ambulatory Visit: Payer: Medicare HMO | Attending: Orthopedic Surgery | Admitting: Physical Therapy

## 2017-12-05 ENCOUNTER — Encounter: Payer: Self-pay | Admitting: Physical Therapy

## 2017-12-05 DIAGNOSIS — M6281 Muscle weakness (generalized): Secondary | ICD-10-CM | POA: Insufficient documentation

## 2017-12-05 DIAGNOSIS — M25511 Pain in right shoulder: Secondary | ICD-10-CM | POA: Diagnosis not present

## 2017-12-05 DIAGNOSIS — G8929 Other chronic pain: Secondary | ICD-10-CM | POA: Diagnosis not present

## 2017-12-05 DIAGNOSIS — M542 Cervicalgia: Secondary | ICD-10-CM | POA: Diagnosis not present

## 2017-12-05 DIAGNOSIS — M25512 Pain in left shoulder: Secondary | ICD-10-CM | POA: Insufficient documentation

## 2017-12-05 NOTE — Therapy (Signed)
[facility] [place], [name], [address] Phone: [phone]   Fax:  [phone]  Physical Therapy Treatment  Patient Details  Name: Teresa Lee MRN: [id number] Date of Birth: 1960-04-10 Referring Provider: Gary Fleet PA-C   Encounter Date: 12/05/2017  PT End of Session - 12/05/17 1159    Visit Number  6    Date for PT Re-Evaluation  12/05/17    Authorization Type  Progress note every 10th visit, KX modifier after 15 visits    PT Start Time  1117    PT Stop Time  1212    PT Time Calculation (min)  55 min    Activity Tolerance  Patient tolerated treatment well    Behavior During Therapy  [facility] for tasks assessed/performed       Past Medical History:  Diagnosis Date  . Arthritis    osteoarthritis  . Asthma   . Blind left eye   . Cancer ([place])    skin  . Crohn's disease ([place])   . CTS (carpal tunnel syndrome)   . DJD (degenerative joint disease)    Both cervical spine and LS spine  . Fibromyalgia   . GERD (gastroesophageal reflux disease)   . Heart murmur   . Herniated nucleus pulposus   . Hyperlipidemia   . Hypertension   . IBS (irritable bowel syndrome)   . Leukocytosis   . Myasthenia gravis   . Myasthenia gravis ([place])   . Spinal cord stimulator status     Past Surgical History:  Procedure Laterality Date  . BREAST REDUCTION SURGERY    . CARPAL TUNNEL RELEASE     bilateral  . cervical disc inj    . CHOLECYSTECTOMY    . ENDOMETRIAL ABLATION W/ NOVASURE    . MELANOMA EXCISION    . NASAL SINUS SURGERY    . THYMECTOMY     due to myastenia gravis  . THYMECTOMY      There were no vitals filed for this visit.  Subjective Assessment - 12/05/17 1142    Subjective  Patient reported doing well overall, yet had a fall at home yesterday onto right side    Pertinent History  Internal stimulator, Myasthenia Gravis, Fibromyalgia, HTN, Asthma    Limitations  Lifting;House hold activities    Currently  in Pain?  Yes    Pain Score  2     Pain Location  Shoulder    Pain Orientation  Left;Right    Pain Descriptors / Indicators  Sore    Pain Type  Chronic pain    Pain Onset  More than a month ago    Pain Frequency  Intermittent    Aggravating Factors   increased exercises or activity, lifting    Pain Relieving Factors  pain meds                       [facility] Adult PT Treatment/Exercise - 12/05/17 0001      Exercises   Exercises  Shoulder      Shoulder Exercises: Standing   External Rotation  Strengthening;Both;Theraband   each till fatigue   Theraband Level (Shoulder External Rotation)  Level 2 (Red)      Shoulder Exercises: Pulleys   Flexion  5 minutes      Shoulder Exercises: ROM/Strengthening   UBE (Upper Arm Bike)  8 minutes at 90 RPM's.    Wall Pushups  20 reps      Moist Heat Therapy  Number Minutes Moist Heat  15 Minutes    Moist Heat Location  Shoulder      Electrical Stimulation   Electrical Stimulation Location  bil scapula    Electrical Stimulation Action  IFC    Electrical Stimulation Parameters  80-_0  x54mn    Electrical Stimulation Goals  Tone;Pain      Manual Therapy   Manual Therapy  Soft tissue mobilization    Manual therapy comments  manual STW to bil scapular boarder esp right side to reduce pain and tone               PT Short Term Goals - 11/08/17 1814      PT SHORT TERM GOAL #1   Title  STG=LTG        PT Long Term Goals - 12/05/17 1144      PT LONG TERM GOAL #1   Title  Patient will be independent with HEP    Time  4    Period  Weeks    Status  Achieved   12/05/17     PT LONG TERM GOAL #2   Title  Patient will report ability to sleep throughout the night with less than 2/10 bilateral shoudler pain for a restful night's sleep.    Time  4    Period  Weeks    Status  Achieved   able to sleep without shoulder pain 12/05/17     PT LONG TERM GOAL #3   Title  Patient will be able to report the ability to  perform ADL's with pain less than 3/10 in bilateral shoulders    Time  4    Period  Weeks    Status  On-going   3-4/10 12/05/17     PT LONG TERM GOAL #4   Title  Patient will demonstrate 4+/5 or greater bilateral shoulder strength in all planes to improve stability during functional activities.    Time  4    Period  Weeks    Status  On-going   NT 12/05/17           Plan - 12/05/17 1202    Clinical Impression Statement  Patient tolerated treatment well today. Patient able to progress a new exercise today with no difficulty. Patient has reported overall improvement and able to sleep without discomfort in shoulders. Patient reported doing HEP daily. Patient met goal #1 and #2 today with som ongoing limitations.     Rehab Potential  Good    PT Frequency  2x / week    PT Duration  4 weeks    PT Treatment/Interventions  ADLs/Self Care Home Management;Dry needling;Taping;Therapeutic activities;Therapeutic exercise;Functional mobility training;Patient/family education;Manual techniques;Passive range of motion;Moist Heat;Neuromuscular re-education;Iontophoresis 436mml Dexamethasone;Electrical Stimulation;Cryotherapy;Ultrasound    PT Next Visit Plan  cont with POC per POC per PT    Consulted and Agree with Plan of Care  Patient       Patient will benefit from skilled therapeutic intervention in order to improve the following deficits and impairments:  Pain, Postural dysfunction, Decreased mobility, Decreased activity tolerance, Decreased strength, Impaired UE functional use  Visit Diagnosis: Chronic left shoulder pain  Chronic right shoulder pain  Muscle weakness (generalized)     Problem List Patient Active Problem List   Diagnosis Date Noted  . Fibromyalgia syndrome 04/30/2017  . RLS (restless legs syndrome) 12/26/2016  . B12 deficiency 12/26/2016  . Vitamin D deficiency, unspecified 12/26/2016  . GERD (gastroesophageal reflux disease) 12/26/2016  . Anxiety disorder  12/26/2016  .  Hyponatremia 10/24/2016  . AKI (acute kidney injury) ([place]) 10/23/2016  . Hypomagnesemia 10/22/2016  . Hypophosphatemia 10/22/2016  . Hypokalemia 10/21/2016  . Wound infection after surgery 10/21/2016  . Sepsis ([place]) 10/21/2016  . Myasthenia gravis ([place]) 10/21/2016  . Melanoma ([place]) 10/21/2016  . Crohn disease ([place]) 10/21/2016  . Cellulitis 10/21/2016  . Palpitations 08/25/2013  . Leukocytosis 05/25/2011  . Hyperlipidemia 04/20/2007  . Essential hypertension 04/20/2007  . Asthma 04/20/2007  . CARPAL TUNNEL SYNDROME, HX OF 04/20/2007    Kacy Hegna P, PTA 12/05/2017, 12:12 PM  [facility] [address] [place], [name], [address] Phone: [phone]   Fax:  [phone]  Name: Teresa Lee MRN: [id number] Date of Birth: Jul 17, 1960

## 2017-12-07 DIAGNOSIS — K589 Irritable bowel syndrome without diarrhea: Secondary | ICD-10-CM | POA: Diagnosis not present

## 2017-12-07 DIAGNOSIS — K5080 Crohn's disease of both small and large intestine without complications: Secondary | ICD-10-CM | POA: Diagnosis not present

## 2017-12-11 ENCOUNTER — Ambulatory Visit: Payer: Medicare HMO | Admitting: *Deleted

## 2017-12-11 DIAGNOSIS — M542 Cervicalgia: Secondary | ICD-10-CM | POA: Diagnosis not present

## 2017-12-11 DIAGNOSIS — M25511 Pain in right shoulder: Secondary | ICD-10-CM

## 2017-12-11 DIAGNOSIS — M25512 Pain in left shoulder: Secondary | ICD-10-CM | POA: Diagnosis not present

## 2017-12-11 DIAGNOSIS — M6281 Muscle weakness (generalized): Secondary | ICD-10-CM | POA: Diagnosis not present

## 2017-12-11 DIAGNOSIS — G8929 Other chronic pain: Secondary | ICD-10-CM | POA: Diagnosis not present

## 2017-12-11 NOTE — Therapy (Signed)
[place], [name], [address] Phone: [phone]   Fax:  [phone]  Physical Therapy Treatment  Patient Details  Name: Teresa Lee MRN: [id number] Date of Birth: [birth year] Referring Provider: Gary Fleet PA-C   Encounter Date: 12/11/2017  PT End of Session - 12/11/17 1211    Visit Number  7    Number of Visits  8    Date for PT Re-Evaluation  12/05/17    Authorization Type  Progress note every 10th visit, KX modifier after 15 visits    PT Start Time  1115    PT Stop Time  1205    PT Time Calculation (min)  50 min    Activity Tolerance  Patient tolerated treatment well    Behavior During Therapy  [facility] for tasks assessed/performed       Past Medical History:  Diagnosis Date  . Arthritis    osteoarthritis  . Asthma   . Blind left eye   . Cancer ([place])    skin  . Crohn's disease ([place])   . CTS (carpal tunnel syndrome)   . DJD (degenerative joint disease)    Both cervical spine and LS spine  . Fibromyalgia   . GERD (gastroesophageal reflux disease)   . Heart murmur   . Herniated nucleus pulposus   . Hyperlipidemia   . Hypertension   . IBS (irritable bowel syndrome)   . Leukocytosis   . Myasthenia gravis   . Myasthenia gravis ([place])   . Spinal cord stimulator status     Past Surgical History:  Procedure Laterality Date  . BREAST REDUCTION SURGERY    . CARPAL TUNNEL RELEASE     bilateral  . cervical disc inj    . CHOLECYSTECTOMY    . ENDOMETRIAL ABLATION W/ NOVASURE    . MELANOMA EXCISION    . NASAL SINUS SURGERY    . THYMECTOMY     due to myastenia gravis  . THYMECTOMY      There were no vitals filed for this visit.                    [facility] Adult PT Treatment/Exercise - 12/11/17 0001      Exercises   Exercises  Shoulder      Shoulder Exercises: Standing   External Rotation  Strengthening;Both;Theraband   each till fatigue   Theraband Level (Shoulder  External Rotation)  Level 1 (Yellow)      Shoulder Exercises: Pulleys   Flexion  5 minutes      Shoulder Exercises: ROM/Strengthening   UBE (Upper Arm Bike)  8 minutes at 90 RPM's.      Modalities   Modalities  Electrical Stimulation;Moist Heat      Moist Heat Therapy   Number Minutes Moist Heat  15 Minutes    Moist Heat Location  Shoulder      Electrical Stimulation   Electrical Stimulation Location  bil scapulae  IFC x 15 mins   80-150hz     Electrical Stimulation Action  supine    Electrical Stimulation Goals  Tone;Pain      Manual Therapy   Manual Therapy  Soft tissue mobilization    Manual therapy comments  manual STW to bil scapular boarder esp right side to reduce pain and tone               PT Short Term Goals - 11/08/17 1814      PT SHORT TERM GOAL #1  Title  STG=LTG        PT Long Term Goals - 12/05/17 1144      PT LONG TERM GOAL #1   Title  Patient will be independent with HEP    Time  4    Period  Weeks    Status  Achieved   12/05/17     PT LONG TERM GOAL #2   Title  Patient will report ability to sleep throughout the night with less than 2/10 bilateral shoudler pain for a restful night's sleep.    Time  4    Period  Weeks    Status  Achieved   able to sleep without shoulder pain 12/05/17     PT LONG TERM GOAL #3   Title  Patient will be able to report the ability to perform ADL's with pain less than 3/10 in bilateral shoulders    Time  4    Period  Weeks    Status  On-going   3-4/10 12/05/17     PT LONG TERM GOAL #4   Title  Patient will demonstrate 4+/5 or greater bilateral shoulder strength in all planes to improve stability during functional activities.    Time  4    Period  Weeks    Status  On-going   NT 12/05/17           Plan - 12/11/17 1211    Clinical Impression Statement  Pt arrived today doing better with lower pain levels and was able to perform exs again with minimal complaints except muscle burning with ER  strengthening. Normal modality response today    Clinical Presentation  Stable    Rehab Potential  Good    PT Frequency  2x / week    PT Duration  4 weeks    PT Treatment/Interventions  ADLs/Self Care Home Management;Dry needling;Taping;Therapeutic activities;Therapeutic exercise;Functional mobility training;Patient/family education;Manual techniques;Passive range of motion;Moist Heat;Neuromuscular re-education;Iontophoresis 64m/ml Dexamethasone;Electrical Stimulation;Cryotherapy;Ultrasound    PT Next Visit Plan  cont with POC per POC per PT    Consulted and Agree with Plan of Care  Patient       Patient will benefit from skilled therapeutic intervention in order to improve the following deficits and impairments:  Pain, Postural dysfunction, Decreased mobility, Decreased activity tolerance, Decreased strength, Impaired UE functional use  Visit Diagnosis: Chronic left shoulder pain  Chronic right shoulder pain  Muscle weakness (generalized)  Cervicalgia     Problem List Patient Active Problem List   Diagnosis Date Noted  . Fibromyalgia syndrome 04/30/2017  . RLS (restless legs syndrome) 12/26/2016  . B12 deficiency 12/26/2016  . Vitamin D deficiency, unspecified 12/26/2016  . GERD (gastroesophageal reflux disease) 12/26/2016  . Anxiety disorder 12/26/2016  . Hyponatremia 10/24/2016  . [place]) [place] 10/23/2016  . Hypomagnesemia 10/22/2016  . Hypophosphatemia 10/22/2016  . Hypokalemia 10/21/2016  . Wound infection after surgery 10/21/2016  . Sepsis ([place] 10/21/2016  . [place]henia [place]is ([place] 10/21/2016  . Melanoma ([place] 10/21/2016  . Crohn disease ([place] 10/21/2016  . Cellulitis 10/21/2016  . Palpitations 08/25/2013  . Leukocytosis 05/25/2011  . Hyperlipidemia 04/20/2007  . Essential hypertension 04/20/2007  . Asthma 04/20/2007  . CARPAL TUNNEL SYNDROME, HX OF 04/20/2007    [place],CHRIS, PTA 12/11/2017, 12:16 PM  [facility] [place] [address]Phone: [phone]  Fax:  [phone] Name: Teresa RasnicMRN: [id number] of Birth: 1January 01, 1963

## 2017-12-13 ENCOUNTER — Ambulatory Visit: Payer: Medicare HMO | Admitting: Physical Therapy

## 2017-12-13 ENCOUNTER — Encounter: Payer: Self-pay | Admitting: Physical Therapy

## 2017-12-13 DIAGNOSIS — M25511 Pain in right shoulder: Secondary | ICD-10-CM | POA: Diagnosis not present

## 2017-12-13 DIAGNOSIS — G8929 Other chronic pain: Secondary | ICD-10-CM

## 2017-12-13 DIAGNOSIS — M542 Cervicalgia: Secondary | ICD-10-CM | POA: Diagnosis not present

## 2017-12-13 DIAGNOSIS — M6281 Muscle weakness (generalized): Secondary | ICD-10-CM | POA: Diagnosis not present

## 2017-12-13 DIAGNOSIS — M25512 Pain in left shoulder: Secondary | ICD-10-CM | POA: Diagnosis not present

## 2017-12-13 NOTE — Therapy (Addendum)
[facility], [name], [address] Phone: [phone]   Fax:  [phone]  Physical Therapy Treatment/Discharge  Patient Details  Name: Teresa Lee MRN: [id number] Date of Birth: 1960-08-05 Referring Provider: Gary Fleet PA-C   Encounter Date: 12/13/2017  PT End of Session - 12/13/17 1104    Visit Number  8    Number of Visits  8    Date for PT Re-Evaluation  12/05/17    PT Start Time  1031    PT Stop Time  1115    PT Time Calculation (min)  44 min    Activity Tolerance  Patient tolerated treatment well    Behavior During Therapy  [facility] for tasks assessed/performed       Past Medical History:  Diagnosis Date  . Arthritis    osteoarthritis  . Asthma   . Blind left eye   . Cancer ([place])    skin  . Crohn's disease ([place])   . CTS (carpal tunnel syndrome)   . DJD (degenerative joint disease)    Both cervical spine and LS spine  . Fibromyalgia   . GERD (gastroesophageal reflux disease)   . Heart murmur   . Herniated nucleus pulposus   . Hyperlipidemia   . Hypertension   . IBS (irritable bowel syndrome)   . Leukocytosis   . Myasthenia gravis   . Myasthenia gravis ([place])   . Spinal cord stimulator status     Past Surgical History:  Procedure Laterality Date  . BREAST REDUCTION SURGERY    . CARPAL TUNNEL RELEASE     bilateral  . cervical disc inj    . CHOLECYSTECTOMY    . ENDOMETRIAL ABLATION W/ NOVASURE    . MELANOMA EXCISION    . NASAL SINUS SURGERY    . THYMECTOMY     due to myastenia gravis  . THYMECTOMY      There were no vitals filed for this visit.  Subjective Assessment - 12/13/17 1039    Subjective  Patient arrived with some ongoing soreness, overall progress with good and bad days    Pertinent History  Internal stimulator, Myasthenia Gravis, Fibromyalgia, HTN, Asthma    Limitations  Lifting;House hold activities    Currently in Pain?  Yes    Pain Score  2     Pain Location   Shoulder    Pain Orientation  Left;Right    Pain Descriptors / Indicators  Discomfort    Pain Type  Chronic pain    Pain Onset  More than a month ago    Pain Frequency  Intermittent    Aggravating Factors   lifting or prolong activity    Pain Relieving Factors  rest and medication         [facility] PT Assessment - 12/13/17 0001      Strength   Strength Assessment Site  Shoulder    Right/Left Shoulder  Left;Right    Right Shoulder Flexion  4+/5    Right Shoulder ABduction  4+/5    Right Shoulder Internal Rotation  5/5    Right Shoulder External Rotation  4+/5    Left Shoulder Flexion  5/5    Left Shoulder ABduction  4+/5    Left Shoulder Internal Rotation  4+/5    Left Shoulder External Rotation  5/5                   [facility] Adult PT Treatment/Exercise - 12/13/17 0001  Shoulder Exercises: Pulleys   Flexion  5 minutes      Shoulder Exercises: ROM/Strengthening   UBE (Upper Arm Bike)  8 minutes at 90 RPM's.      Moist Heat Therapy   Number Minutes Moist Heat  15 Minutes    Moist Heat Location  Shoulder      Electrical Stimulation   Electrical Stimulation Location  bil scapulae  IFC x 15 mins   80-'150hz'     Electrical Stimulation Goals  Tone;Pain      Manual Therapy   Manual Therapy  Soft tissue mobilization    Manual therapy comments  manual STW to bil cervical paraspinals/ UT and scapular boarder esp right side to reduce pain and tone               PT Short Term Goals - 11/08/17 1814      PT SHORT TERM GOAL #1   Title  STG=LTG        PT Long Term Goals - 12/13/17 1102      PT LONG TERM GOAL #1   Title  Patient will be independent with HEP    Time  4    Period  Weeks    Status  Achieved      PT LONG TERM GOAL #2   Title  Patient will report ability to sleep throughout the night with less than 2/10 bilateral shoudler pain for a restful night's sleep.    Baseline  met 12/05/17    Time  4    Period  Weeks    Status  Achieved      PT  LONG TERM GOAL #3   Title  Patient will be able to report the ability to perform ADL's with pain less than 3/10 in bilateral shoulders    Baseline  met 12/13/17    Time  4    Period  Weeks    Status  Achieved   3/10 with ADL's     PT LONG TERM GOAL #4   Title  Patient will demonstrate 4+/5 or greater bilateral shoulder strength in all planes to improve stability during functional activities.    Baseline  12/13/17    Time  4    Period  Weeks    Status  Achieved            Plan - 12/13/17 1103    Clinical Impression Statement  Patient has met all current goals today. DC to HEP    Rehab Potential  Good    PT Frequency  2x / week    PT Duration  4 weeks    PT Treatment/Interventions  ADLs/Self Care Home Management;Dry needling;Taping;Therapeutic activities;Therapeutic exercise;Functional mobility training;Patient/family education;Manual techniques;Passive range of motion;Moist Heat;Neuromuscular re-education;Iontophoresis 1m/ml Dexamethasone;Electrical Stimulation;Cryotherapy;Ultrasound    PT Next Visit Plan  DC    Consulted and Agree with Plan of Care  Patient       Patient will benefit from skilled therapeutic intervention in order to improve the following deficits and impairments:  Pain, Postural dysfunction, Decreased mobility, Decreased activity tolerance, Decreased strength, Impaired UE functional use  Visit Diagnosis: Chronic left shoulder pain  Chronic right shoulder pain  Muscle weakness (generalized)     Problem List Patient Active Problem List   Diagnosis Date Noted  . Fibromyalgia syndrome 04/30/2017  . RLS (restless legs syndrome) 12/26/2016  . B12 deficiency 12/26/2016  . Vitamin D deficiency, unspecified 12/26/2016  . GERD (gastroesophageal reflux disease) 12/26/2016  . Anxiety disorder 12/26/2016  .  Hyponatremia 10/24/2016  . AKI (acute kidney injury) ([place]) 10/23/2016  . Hypomagnesemia 10/22/2016  . Hypophosphatemia 10/22/2016  . Hypokalemia  10/21/2016  . Wound infection after surgery 10/21/2016  . Sepsis ([place]) 10/21/2016  . Myasthenia gravis ([place]) 10/21/2016  . Melanoma ([place]) 10/21/2016  . Crohn disease ([place]) 10/21/2016  . Cellulitis 10/21/2016  . Palpitations 08/25/2013  . Leukocytosis 05/25/2011  . Hyperlipidemia 04/20/2007  . Essential hypertension 04/20/2007  . Asthma 04/20/2007  . CARPAL TUNNEL SYNDROME, HX OF 04/20/2007   PHYSICAL THERAPY DISCHARGE SUMMARY  Visits from Start of Care: 8  Current functional level related to goals / functional outcomes: See above   Remaining deficits: Goals met but remaining soreness.   Education / Equipment: HEP Plan: Patient agrees to discharge.  Patient goals were met. Patient is being discharged due to meeting the stated rehab goals.  ?????  Gabriela Eves, PT, DPT      Ladean Raya, PTA 12/13/17 11:18 AM  [facility], [name], [address] Phone: [phone]   Fax:  [phone]  Name: Teresa Lee MRN: [id number] Date of Birth: 08-03-1960

## 2017-12-18 DIAGNOSIS — M542 Cervicalgia: Secondary | ICD-10-CM | POA: Diagnosis not present

## 2017-12-18 DIAGNOSIS — M4692 Unspecified inflammatory spondylopathy, cervical region: Secondary | ICD-10-CM | POA: Diagnosis not present

## 2017-12-18 DIAGNOSIS — M4696 Unspecified inflammatory spondylopathy, lumbar region: Secondary | ICD-10-CM | POA: Diagnosis not present

## 2017-12-18 DIAGNOSIS — M5030 Other cervical disc degeneration, unspecified cervical region: Secondary | ICD-10-CM | POA: Diagnosis not present

## 2017-12-19 DIAGNOSIS — M25512 Pain in left shoulder: Secondary | ICD-10-CM | POA: Diagnosis not present

## 2017-12-19 DIAGNOSIS — M47812 Spondylosis without myelopathy or radiculopathy, cervical region: Secondary | ICD-10-CM | POA: Diagnosis not present

## 2017-12-19 DIAGNOSIS — K5080 Crohn's disease of both small and large intestine without complications: Secondary | ICD-10-CM | POA: Diagnosis not present

## 2017-12-19 DIAGNOSIS — M47816 Spondylosis without myelopathy or radiculopathy, lumbar region: Secondary | ICD-10-CM | POA: Diagnosis not present

## 2017-12-19 DIAGNOSIS — G894 Chronic pain syndrome: Secondary | ICD-10-CM | POA: Diagnosis not present

## 2017-12-21 DIAGNOSIS — R69 Illness, unspecified: Secondary | ICD-10-CM | POA: Diagnosis not present

## 2017-12-27 ENCOUNTER — Other Ambulatory Visit: Payer: Self-pay | Admitting: Family Medicine

## 2017-12-27 DIAGNOSIS — I10 Essential (primary) hypertension: Secondary | ICD-10-CM

## 2018-01-04 ENCOUNTER — Encounter: Payer: Self-pay | Admitting: Family Medicine

## 2018-01-04 ENCOUNTER — Ambulatory Visit (INDEPENDENT_AMBULATORY_CARE_PROVIDER_SITE_OTHER): Payer: Medicare HMO | Admitting: Family Medicine

## 2018-01-04 VITALS — BP 122/78 | HR 92 | Temp 98.5°F | Resp 16 | Ht 61.0 in | Wt 150.2 lb

## 2018-01-04 DIAGNOSIS — E538 Deficiency of other specified B group vitamins: Secondary | ICD-10-CM

## 2018-01-04 DIAGNOSIS — R110 Nausea: Secondary | ICD-10-CM

## 2018-01-04 DIAGNOSIS — Z23 Encounter for immunization: Secondary | ICD-10-CM | POA: Diagnosis not present

## 2018-01-04 DIAGNOSIS — E559 Vitamin D deficiency, unspecified: Secondary | ICD-10-CM

## 2018-01-04 DIAGNOSIS — K219 Gastro-esophageal reflux disease without esophagitis: Secondary | ICD-10-CM | POA: Diagnosis not present

## 2018-01-04 DIAGNOSIS — Z0271 Encounter for disability determination: Secondary | ICD-10-CM

## 2018-01-04 LAB — COMPREHENSIVE METABOLIC PANEL
ALK PHOS: 49 U/L (ref 39–117)
ALT: 9 U/L (ref 0–35)
AST: 10 U/L (ref 0–37)
Albumin: 3.7 g/dL (ref 3.5–5.2)
BILIRUBIN TOTAL: 0.6 mg/dL (ref 0.2–1.2)
BUN: 21 mg/dL (ref 6–23)
CALCIUM: 8.4 mg/dL (ref 8.4–10.5)
CO2: 28 mEq/L (ref 19–32)
Chloride: 98 mEq/L (ref 96–112)
Creatinine, Ser: 0.73 mg/dL (ref 0.40–1.20)
GFR: 87.68 mL/min (ref >60.00)
GLUCOSE: 94 mg/dL (ref 70–99)
POTASSIUM: 3.2 meq/L — AB (ref 3.5–5.1)
Sodium: 139 mEq/L (ref 135–145)
TOTAL PROTEIN: 6.5 g/dL (ref 6.0–8.3)

## 2018-01-04 LAB — VITAMIN B12: Vitamin B-12: 355 pg/mL (ref 211–911)

## 2018-01-04 LAB — VITAMIN D 25 HYDROXY (VIT D DEFICIENCY, FRACTURES): VITD: 36.63 ng/mL (ref 30.00–100.00)

## 2018-01-04 MED ORDER — ONDANSETRON HCL 4 MG PO TABS: 4.0000 mg | ORAL_TABLET | Freq: Three times a day (TID) | ORAL | 0 refills | Status: DC | PRN

## 2018-01-04 NOTE — Progress Notes (Signed)
ACUTE VISIT   HPI:  Chief Complaint  Patient presents with  . Need disability forms filled out    Teresa Lee is a 57 y.o. female, who is here today requesting disability forms to be completed.  She has to have forms filled out annually. She states that she is not sure about the reason for her disability.She thinks it is due to myasthenia gravis and fibromyalgia. Myasthenia gravis,she follows with neurologist. She is on Mestinon 60 mg daily.  She also has Hx of chron disease,chronic depression and anxiety.  Lab Results  Component Value Date   CREATININE 0.76 09/05/2017   BUN 23 (H) 09/05/2017   NA 136 09/05/2017   K 3.8 09/05/2017   CL 103 09/05/2017   CO2 24 09/05/2017   She drives and has independent ADL's and IADL's.   B12 deficiency,she is on oral B12 1000 mcg. She wonders if she needs to continue B12 injections. Last B12 1000 mcg IM was in 09/2017   Vit D deficiency, she is on Vit D3 5000 U daily. Last 25 OH vit D was 30 in 08/2017. Ca++ 8.8 in 08/2017, LFT was not done at that time.  C/O occasional nausea,which she has had for a few days. She has not identified exacerbating or alleviating factors. No sick contact or recent travel. No fever,chills,or worsening fatigue.  No abdominal pain,vomiting,changes in bowel  She is requesting Rx for Zofran.  Hx of GERD,she is on Protonix 40 mg daily. She has not had heartburn.   Review of Systems  Constitutional: Positive for fatigue. Negative for activity change, appetite change and fever.  HENT: Negative for mouth sores, nosebleeds and trouble swallowing.   Eyes: Negative for redness and visual disturbance.  Respiratory: Negative for cough, shortness of breath and wheezing.   Cardiovascular: Negative for chest pain, palpitations and leg swelling.  Gastrointestinal: Positive for nausea. Negative for abdominal pain and vomiting.       Negative for changes in bowel habits.  Genitourinary: Negative  for decreased urine volume, dysuria and hematuria.  Musculoskeletal: Positive for arthralgias and myalgias. Negative for gait problem.  Neurological: Negative for syncope, weakness and headaches.  Psychiatric/Behavioral: Negative for confusion. The patient is nervous/anxious.       Current Outpatient Medications on File Prior to Visit  Medication Sig Dispense Refill  . albuterol (PROVENTIL HFA) 108 (90 Base) MCG/ACT inhaler Inhale into the lungs.    Marland Kitchen albuterol (PROVENTIL) (2.5 MG/3ML) 0.083% nebulizer solution Take 2.5 mg by nebulization every 6 (six) hours as needed for Wheezing.    . Cholecalciferol (VITAMIN D-3) 5000 UNITS TABS Take 1 tablet by mouth daily.    . citalopram (CELEXA) 40 MG tablet Take 1 tablet (40 mg total) by mouth daily. 90 tablet 1  . DELZICOL 400 MG CPDR DR capsule Take 800 mg by mouth 2 (two) times daily.    Marland Kitchen desmopressin (DDAVP) 0.1 MG tablet Take 0.3 mg by mouth daily.     Marland Kitchen desonide (DESOWEN) 0.05 % cream Apply topically daily as needed. 30 g 0  . estradiol (ESTRACE) 2 MG tablet Take 2 mg by mouth 2 (two) times daily.     Marland Kitchen HYDROcodone-acetaminophen (NORCO) 10-325 MG tablet     . ketoconazole (NIZORAL) 2 % cream Apply 1 application topically daily. 15 g 1  . losartan (COZAAR) 50 MG tablet Take 1 tablet (50 mg total) by mouth daily. 90 tablet 2  . meloxicam (MOBIC) 15 MG tablet TAKE 1 TABLET  DAILY 90 tablet 0  . mycophenolate (CELLCEPT) 500 MG tablet Take by mouth.    . pantoprazole (PROTONIX) 40 MG tablet Take 1 tablet (40 mg total) by mouth daily. 90 tablet 3  . potassium chloride (KLOR-CON) 20 MEQ packet Take 10-20 mEq by mouth daily. 90 tablet 1  . potassium chloride SA (K-DUR,KLOR-CON) 20 MEQ tablet Take by mouth.    . predniSONE (DELTASONE) 5 MG tablet Take 7.5 mg by mouth daily.     . progesterone (PROMETRIUM) 100 MG capsule Take 100 mg by mouth at bedtime.    . pyridostigmine (MESTINON) 60 MG tablet     . rOPINIRole (REQUIP) 1 MG tablet Take 1-2 tablets  (1-2 mg total) by mouth at bedtime. 180 tablet 2  . simvastatin (ZOCOR) 20 MG tablet Take 1 tablet (20 mg total) by mouth daily. 90 tablet 3  . triamterene-hydrochlorothiazide (DYAZIDE) 37.5-25 MG capsule TAKE (1) CAPSULE DAILY IN THE MORNING. 90 capsule 0  . valACYclovir (VALTREX) 1000 MG tablet     . Vitamin D, Ergocalciferol, (DRISDOL) 50000 units CAPS capsule 1 cap weekly x 8 weeks then q 2 weeks 12 capsule 3   No current facility-administered medications on file prior to visit.      Past Medical History:  Diagnosis Date  . Arthritis    osteoarthritis  . Asthma   . Blind left eye   . Cancer ([employer])    skin  . Crohn's disease ([place])   . CTS (carpal tunnel syndrome)   . DJD (degenerative joint disease)    Both cervical spine and LS spine  . Fibromyalgia   . GERD (gastroesophageal reflux disease)   . Heart murmur   . Herniated nucleus pulposus   . Hyperlipidemia   . Hypertension   . IBS (irritable bowel syndrome)   . Leukocytosis   . Myasthenia gravis   . Myasthenia gravis ([place])   . Spinal cord stimulator status    Allergies  Allergen Reactions  . Latex Other (See Comments)    BLISTER  . Lorazepam     Other reaction(s): Hallucinations  . Percocet [Oxycodone-Acetaminophen] Hives  . Sulfa Antibiotics Hives  . Sulfamethoxazole-Trimethoprim Hives  . Tapentadol Hcl Other (See Comments)    HEADACHES NUCENTA  . Nucynta [Tapentadol]     Headache   . Orange Concentrate [Flavoring Agent]     Acid reflux  . Other Other (See Comments), Hives and Nausea Only    Some nuts cause a rash  . Tomato     Burns skin  . Bioflavonoids Rash    Causes burning of skin and blisters  . Cefixime     Due to myasthenia gravis    Social History   Socioeconomic History  . Marital status: Married    Spouse name: Not on file  . Number of children: 1  . Years of education: Not on file  . Highest education level: Not on file  Occupational History  . Occupation: disabled  Social Needs    . Financial resource strain: Not on file  . Food insecurity:    Worry: Not on file    Inability: Not on file  . Transportation needs:    Medical: Not on file    Non-medical: Not on file  Tobacco Use  . Smoking status: Never Smoker  . Smokeless tobacco: Never Used  Substance and Sexual Activity  . Alcohol use: Yes    Comment: occassional wine  . Drug use: No  . Sexual activity: Not on file  Lifestyle  . Physical activity:    Days per week: Not on file    Minutes per session: Not on file  . Stress: Not on file  Relationships  . Social connections:    Talks on phone: Not on file    Gets together: Not on file    Attends religious service: Not on file    Active member of club or organization: Not on file    Attends meetings of clubs or organizations: Not on file    Relationship status: Not on file  Other Topics Concern  . Not on file  Social History Narrative  . Not on file    Vitals:   01/04/18 0914  BP: 122/78  Pulse: 92  Resp: 16  Temp: 98.5 F (36.9 C)  SpO2: 95%   Body mass index is 28.39 kg/m.   Physical Exam  Nursing note and vitals reviewed. Constitutional: She is oriented to person, place, and time. She appears well-developed. No distress.  HENT:  Head: Normocephalic and atraumatic.  Mouth/Throat: Oropharynx is clear and moist and mucous membranes are normal.  Eyes: Pupils are equal, round, and reactive to light. Conjunctivae are normal. Left eye exhibits abnormal extraocular motion.  Cardiovascular: Normal rate and regular rhythm.  No murmur heard. Pulses:      Dorsalis pedis pulses are 2+ on the right side, and 2+ on the left side.  Respiratory: Effort normal and breath sounds normal. No respiratory distress.  GI: Soft. She exhibits no mass. There is no hepatomegaly. There is no tenderness.  Musculoskeletal: She exhibits no edema.  Lymphadenopathy:    She has no cervical adenopathy.  Neurological: She is alert and oriented to person, place, and  time. She has normal strength. No cranial nerve deficit. Gait normal.  Skin: Skin is warm. No rash noted. No erythema.  Psychiatric: Her mood appears anxious.  Well groomed, good eye contact.      ASSESSMENT AND PLAN:   Teresa Lee was seen today for need disability forms filled out.  Diagnoses and all orders for this visit: Lab Results  Component Value Date   ZWCHENID78 242 01/04/2018   Lab Results  Component Value Date   ALT 9 01/04/2018   AST 10 01/04/2018   ALKPHOS 49 01/04/2018   BILITOT 0.6 01/04/2018   Lab Results  Component Value Date   CREATININE 0.73 01/04/2018   BUN 21 01/04/2018   NA 139 01/04/2018   K 3.2 (L) 01/04/2018   CL 98 01/04/2018   CO2 28 01/04/2018    Encounter for disability examination Form will be completed.  Nausea without vomiting  Possible causes discussed. For now will treat symptom with zofran as requested. If problem persists recommend following with GI.  -     Comprehensive metabolic panel -     ondansetron (ZOFRAN) 4 MG tablet; Take 1 tablet (4 mg total) by mouth every 8 (eight) hours as needed for up to 3 days for nausea or vomiting.  Gastroesophageal reflux disease, esophagitis presence not specified  No changes in current management. This problem can be contributing or causing nausea. GERD precautions.  B12 deficiency  No changes in current management, will follow labs done today and will give further recommendations accordingly.  -     Vitamin B12  Vitamin D deficiency, unspecified  Continue Vit D 5000 U daily. Will adjust vit D dose if needed.  -     VITAMIN D 25 Hydroxy (Vit-D Deficiency, Fractures)  Hypocalcemia  Further recommendations will  be givne according to lab results.  Need for immunization against influenza -     Flu Vaccine QUAD 36+ mos IM      Betty G. Martinique, MD  [facility]. [place] office.

## 2018-01-04 NOTE — Patient Instructions (Addendum)
A few things to remember from today's visit:   Gastroesophageal reflux disease, esophagitis presence not specified  B12 deficiency - Plan: Vitamin B12  Vitamin D deficiency, unspecified - Plan: VITAMIN D 25 Hydroxy (Vit-D Deficiency, Fractures)  Nausea without vomiting - Plan: Comprehensive metabolic panel, ondansetron (ZOFRAN) 4 MG tablet  Hypocalcemia  Nausea could be related to GERD. If not better please follow with gastro.    Please be sure medication list is accurate. If a new problem present, please set up appointment sooner than planned today.

## 2018-01-06 ENCOUNTER — Encounter: Payer: Self-pay | Admitting: Family Medicine

## 2018-01-16 DIAGNOSIS — Z79899 Other long term (current) drug therapy: Secondary | ICD-10-CM | POA: Diagnosis not present

## 2018-01-16 DIAGNOSIS — M25512 Pain in left shoulder: Secondary | ICD-10-CM | POA: Diagnosis not present

## 2018-01-16 DIAGNOSIS — G894 Chronic pain syndrome: Secondary | ICD-10-CM | POA: Diagnosis not present

## 2018-01-16 DIAGNOSIS — M47812 Spondylosis without myelopathy or radiculopathy, cervical region: Secondary | ICD-10-CM | POA: Diagnosis not present

## 2018-01-16 DIAGNOSIS — M47816 Spondylosis without myelopathy or radiculopathy, lumbar region: Secondary | ICD-10-CM | POA: Diagnosis not present

## 2018-01-16 DIAGNOSIS — Z79891 Long term (current) use of opiate analgesic: Secondary | ICD-10-CM | POA: Diagnosis not present

## 2018-01-22 ENCOUNTER — Telehealth: Payer: Self-pay | Admitting: *Deleted

## 2018-01-22 NOTE — Telephone Encounter (Signed)
Left detailed message for patient to call the office and schedule the nurse appointment for B 12 injection for the time and day that is good for her.  Copied from [place] [phone]. Topic: Appointment Scheduling - Scheduling Inquiry for Clinic >> Jan 21, 2018  2:23 PM Reyne Dumas L wrote: Reason for CRM:   Pt believes she needs to have a B12 injection.  Pt would like to come Wednesday, 11/06 around 11:30am. Pt can be reached at [phone].

## 2018-01-23 ENCOUNTER — Encounter: Payer: Medicare HMO | Admitting: Family Medicine

## 2018-01-23 DIAGNOSIS — E538 Deficiency of other specified B group vitamins: Secondary | ICD-10-CM

## 2018-01-23 MED ORDER — CYANOCOBALAMIN 1000 MCG/ML IJ SOLN
1000.0000 ug | Freq: Once | INTRAMUSCULAR | Status: AC
  Administered 2018-01-23: 1000 ug via INTRAMUSCULAR

## 2018-01-25 DIAGNOSIS — R69 Illness, unspecified: Secondary | ICD-10-CM | POA: Diagnosis not present

## 2018-02-07 ENCOUNTER — Telehealth: Payer: Self-pay | Admitting: Family Medicine

## 2018-02-07 ENCOUNTER — Other Ambulatory Visit (INDEPENDENT_AMBULATORY_CARE_PROVIDER_SITE_OTHER): Payer: Medicare HMO

## 2018-02-07 DIAGNOSIS — I10 Essential (primary) hypertension: Secondary | ICD-10-CM

## 2018-02-07 LAB — BASIC METABOLIC PANEL
BUN: 19 mg/dL (ref 6–23)
CO2: 23 meq/L (ref 19–32)
Calcium: 8.9 mg/dL (ref 8.4–10.5)
Chloride: 103 mEq/L (ref 96–112)
Creatinine, Ser: 0.8 mg/dL (ref 0.40–1.20)
GFR: 78.87 mL/min (ref >60.00)
Glucose, Bld: 112 mg/dL — ABNORMAL HIGH (ref 70–99)
POTASSIUM: 3.6 meq/L (ref 3.5–5.1)
SODIUM: 139 meq/L (ref 135–145)

## 2018-02-07 NOTE — Telephone Encounter (Signed)
Spoke with patient and informed her that she is supposed to have B 12 shots every 6 weeks. Patient verbalized understanding.

## 2018-02-07 NOTE — Telephone Encounter (Signed)
Pt is here in the office and would like to know how often she should be taking her B-12 injections.  She state that you could give her a call to the number on file and also leave a msg if she does not answer.

## 2018-02-08 ENCOUNTER — Other Ambulatory Visit: Payer: Self-pay | Admitting: Family Medicine

## 2018-02-08 DIAGNOSIS — M797 Fibromyalgia: Secondary | ICD-10-CM

## 2018-02-12 DIAGNOSIS — G894 Chronic pain syndrome: Secondary | ICD-10-CM | POA: Diagnosis not present

## 2018-02-12 DIAGNOSIS — M47817 Spondylosis without myelopathy or radiculopathy, lumbosacral region: Secondary | ICD-10-CM | POA: Diagnosis not present

## 2018-02-12 DIAGNOSIS — Z79891 Long term (current) use of opiate analgesic: Secondary | ICD-10-CM | POA: Diagnosis not present

## 2018-02-12 DIAGNOSIS — M47812 Spondylosis without myelopathy or radiculopathy, cervical region: Secondary | ICD-10-CM | POA: Diagnosis not present

## 2018-02-12 DIAGNOSIS — M79604 Pain in right leg: Secondary | ICD-10-CM | POA: Diagnosis not present

## 2018-02-12 DIAGNOSIS — Z79899 Other long term (current) drug therapy: Secondary | ICD-10-CM | POA: Diagnosis not present

## 2018-02-21 ENCOUNTER — Other Ambulatory Visit: Payer: Self-pay | Admitting: Family Medicine

## 2018-02-21 DIAGNOSIS — F419 Anxiety disorder, unspecified: Secondary | ICD-10-CM

## 2018-02-27 ENCOUNTER — Encounter: Payer: Self-pay | Admitting: Family Medicine

## 2018-02-27 ENCOUNTER — Ambulatory Visit (INDEPENDENT_AMBULATORY_CARE_PROVIDER_SITE_OTHER): Payer: Medicare HMO | Admitting: Family Medicine

## 2018-02-27 VITALS — BP 120/84 | HR 103 | Temp 98.1°F | Resp 12 | Ht 61.0 in | Wt 148.0 lb

## 2018-02-27 DIAGNOSIS — E782 Mixed hyperlipidemia: Secondary | ICD-10-CM

## 2018-02-27 DIAGNOSIS — R5382 Chronic fatigue, unspecified: Secondary | ICD-10-CM

## 2018-02-27 DIAGNOSIS — E538 Deficiency of other specified B group vitamins: Secondary | ICD-10-CM

## 2018-02-27 DIAGNOSIS — I10 Essential (primary) hypertension: Secondary | ICD-10-CM | POA: Diagnosis not present

## 2018-02-27 DIAGNOSIS — G4700 Insomnia, unspecified: Secondary | ICD-10-CM | POA: Diagnosis not present

## 2018-02-27 DIAGNOSIS — E876 Hypokalemia: Secondary | ICD-10-CM

## 2018-02-27 DIAGNOSIS — R5383 Other fatigue: Secondary | ICD-10-CM | POA: Insufficient documentation

## 2018-02-27 DIAGNOSIS — K5090 Crohn's disease, unspecified, without complications: Secondary | ICD-10-CM

## 2018-02-27 LAB — VITAMIN B12

## 2018-02-27 LAB — LIPID PANEL
Cholesterol: 140 mg/dL (ref 0–200)
HDL: 32.9 mg/dL — ABNORMAL LOW (ref >39.00)
NONHDL: 106.66
TRIGLYCERIDES: 327 mg/dL — AB (ref 0.0–149.0)
Total CHOL/HDL Ratio: 4
VLDL: 65.4 mg/dL — ABNORMAL HIGH (ref 0.0–40.0)

## 2018-02-27 LAB — BASIC METABOLIC PANEL
BUN: 24 mg/dL — ABNORMAL HIGH (ref 6–23)
CO2: 25 meq/L (ref 19–32)
Calcium: 9.3 mg/dL (ref 8.4–10.5)
Chloride: 102 mEq/L (ref 96–112)
Creatinine, Ser: 0.78 mg/dL (ref 0.40–1.20)
GFR: 81.19 mL/min (ref >60.00)
GLUCOSE: 96 mg/dL (ref 70–99)
POTASSIUM: 3.5 meq/L (ref 3.5–5.1)
SODIUM: 139 meq/L (ref 135–145)

## 2018-02-27 LAB — LDL CHOLESTEROL, DIRECT: Direct LDL: 56 mg/dL

## 2018-02-27 MED ORDER — CYANOCOBALAMIN 1000 MCG/ML IJ SOLN
1000.0000 ug | Freq: Once | INTRAMUSCULAR | Status: AC
  Administered 2018-02-27: 1000 ug via INTRAMUSCULAR

## 2018-02-27 MED ORDER — DOXEPIN HCL 10 MG/ML PO CONC: 3.0000 mg | Freq: Every day | ORAL | 1 refills | Status: DC

## 2018-02-27 NOTE — Progress Notes (Signed)
HPI:   [name] is a 57 y.o. female, who is here today for 6 months follow up.   She was last seen on 01/04/2017 for an acute visit, to complete disability form.  Hypertension: Currently she is on triamterene-HCTZ 37.5-25 mg daily and losartan 50 mg daily.  Hypokalemia, currently she is on K-Dur 20 mEq daily.  She has not noted unusual headache, chest pain, dyspnea, or focal deficit. She is not checking BP regularly.   For the past couple weeks she has had episodes of abdominal cramps and diarrhea, she has history of Crohn's disease and recently her medication was changed due to cost. No fever or chills.  Also complaining of "always" being fatigued and sleepy.  She wakes up a few times during the night, in the morning she does not feel rested. She has difficulty falling asleep and wakes up a few times per night. She has tried OTC medications including melatonin but it does not seem to help. RLS, she has been on Requip for about 2 to 3 years.  Chronic pain, fibromyalgia, she follows with pain clinic and on chronic opioid treatment. She also has history of myasthenia gravis, she follows with neurologist.  Next appointment with her neurologist in 03/2018.  Depression and anxiety, she is on Celexa 40 mg daily. She denies depressed mood or suicidal thoughts.  She takes medication at night because it helps her "relax."  B12 deficiency: Since she started B12 IM supplementation every 6 weeks.  Needle and pin sensation she was reporting on 08/28/2016 has improved. Next appointment with neurologist to follow-up on myasthenia gravis is in 03/2018.  Hyperlipidemia: Currently she is on simvastatin, which dose was increased from 20 mg to 40 mg.  Prescription has not been changed, so sometimes she takes 20 mg. She has tolerated the medication well. Last lipid panel on 08/30/2016: TC 157, TG 309, LDL 61, and HDL 41.8. She has not been consistent with a healthful diet. She  does not exercise regularly.  Review of Systems  Constitutional: Positive for fatigue. Negative for activity change, appetite change and fever.  HENT: Negative for mouth sores, nosebleeds, sore throat and trouble swallowing.   Eyes: Negative for redness and visual disturbance.  Respiratory: Negative for cough, shortness of breath and wheezing.   Cardiovascular: Negative for chest pain, palpitations and leg swelling.  Gastrointestinal: Positive for abdominal pain and diarrhea. Negative for nausea and vomiting.       Negative for changes in bowel habits.  Genitourinary: Negative for decreased urine volume, dysuria and hematuria.  Musculoskeletal: Positive for arthralgias, back pain and myalgias. Negative for gait problem.  Neurological: Negative for syncope, weakness and headaches.  Psychiatric/Behavioral: Positive for sleep disturbance. Negative for confusion and suicidal ideas. The patient is nervous/anxious.      Current Outpatient Medications on File Prior to Visit  Medication Sig Dispense Refill  . albuterol (PROVENTIL HFA) 108 (90 Base) MCG/ACT inhaler Inhale into the lungs.    Marland Kitchen albuterol (PROVENTIL) (2.5 MG/3ML) 0.083% nebulizer solution Take 2.5 mg by nebulization every 6 (six) hours as needed for Wheezing.    . APRISO 0.375 g 24 hr capsule Take by mouth daily.     . Cholecalciferol (VITAMIN D-3) 5000 UNITS TABS Take 1 tablet by mouth daily.    Marland Kitchen desmopressin (DDAVP) 0.1 MG tablet Take 0.3 mg by mouth daily.     Marland Kitchen estradiol (ESTRACE) 2 MG tablet Take 2 mg by mouth 2 (two) times daily.     Marland Kitchen  HYDROcodone-acetaminophen (NORCO) 10-325 MG tablet     . losartan (COZAAR) 50 MG tablet Take 1 tablet (50 mg total) by mouth daily. 90 tablet 2  . meloxicam (MOBIC) 15 MG tablet TAKE 1 TABLET DAILY 90 tablet 0  . mycophenolate (CELLCEPT) 500 MG tablet Take by mouth.    . pantoprazole (PROTONIX) 40 MG tablet Take 1 tablet (40 mg total) by mouth daily. 90 tablet 3  . potassium chloride  (KLOR-CON) 20 MEQ packet Take 10-20 mEq by mouth daily. 90 tablet 1  . potassium chloride SA (K-DUR,KLOR-CON) 20 MEQ tablet Take by mouth.    . predniSONE (DELTASONE) 5 MG tablet Take 7.5 mg by mouth daily.     . progesterone (PROMETRIUM) 100 MG capsule Take 100 mg by mouth at bedtime.    . pyridostigmine (MESTINON) 60 MG tablet     . rOPINIRole (REQUIP) 1 MG tablet Take 1-2 tablets (1-2 mg total) by mouth at bedtime. 180 tablet 2  . simvastatin (ZOCOR) 20 MG tablet Take 1 tablet (20 mg total) by mouth daily. 90 tablet 3  . triamterene-hydrochlorothiazide (DYAZIDE) 37.5-25 MG capsule TAKE (1) CAPSULE DAILY IN THE MORNING. 90 capsule 0  . valACYclovir (VALTREX) 1000 MG tablet     . Vitamin D, Ergocalciferol, (DRISDOL) 50000 units CAPS capsule 1 cap weekly x 8 weeks then q 2 weeks 12 capsule 3  . citalopram (CELEXA) 40 MG tablet TAKE 1 TABLET DAILY 90 tablet 2   No current facility-administered medications on file prior to visit.      Past Medical History:  Diagnosis Date  . Arthritis    osteoarthritis  . Asthma   . Blind left eye   . Cancer ([place])    skin  . Crohn's disease ([place])   . CTS (carpal tunnel syndrome)   . DJD (degenerative joint disease)    Both cervical spine and LS spine  . Fibromyalgia   . GERD (gastroesophageal reflux disease)   . Heart murmur   . Herniated nucleus pulposus   . Hyperlipidemia   . Hypertension   . IBS (irritable bowel syndrome)   . Leukocytosis   . Myasthenia gravis   . Myasthenia gravis ([place])   . Spinal cord stimulator status    Allergies  Allergen Reactions  . Latex Other (See Comments)    BLISTER  . Lorazepam     Other reaction(s): Hallucinations  . Percocet [Oxycodone-Acetaminophen] Hives  . Sulfa Antibiotics Hives  . Sulfamethoxazole-Trimethoprim Hives  . Tapentadol Hcl Other (See Comments)    HEADACHES NUCENTA  . Nucynta [Tapentadol]     Headache   . Orange Concentrate [Flavoring Agent]     Acid reflux  . Other Other (See  Comments), Hives and Nausea Only    Some nuts cause a rash  . Tomato     Burns skin  . Bioflavonoids Rash    Causes burning of skin and blisters  . Cefixime     Due to myasthenia gravis    Social History   Socioeconomic History  . Marital status: Married    Spouse name: Not on file  . Number of children: 1  . Years of education: Not on file  . Highest education level: Not on file  Occupational History  . Occupation: disabled  Social Needs  . Financial resource strain: Not on file  . Food insecurity:    Worry: Not on file    Inability: Not on file  . Transportation needs:    Medical: Not on file  Non-medical: Not on file  Tobacco Use  . Smoking status: Never Smoker  . Smokeless tobacco: Never Used  Substance and Sexual Activity  . Alcohol use: Yes    Comment: occassional wine  . Drug use: No  . Sexual activity: Not on file  Lifestyle  . Physical activity:    Days per week: Not on file    Minutes per session: Not on file  . Stress: Not on file  Relationships  . Social connections:    Talks on phone: Not on file    Gets together: Not on file    Attends religious service: Not on file    Active member of club or organization: Not on file    Attends meetings of clubs or organizations: Not on file    Relationship status: Not on file  Other Topics Concern  . Not on file  Social History Narrative  . Not on file    Vitals:   02/27/18 0924  BP: 120/84  Pulse: (!) 103  Resp: 12  Temp: 98.1 F (36.7 C)  SpO2: 94%   Body mass index is 27.96 kg/m.      Physical Exam  Nursing note and vitals reviewed. Constitutional: She is oriented to person, place, and time. She appears well-developed. No distress.  HENT:  Head: Normocephalic and atraumatic.  Mouth/Throat: Oropharynx is clear and moist and mucous membranes are normal.  Eyes: Pupils are equal, round, and reactive to light. Conjunctivae are normal.  Cardiovascular: Normal rate and regular rhythm.  No  murmur heard. Pulses:      Dorsalis pedis pulses are 2+ on the right side, and 2+ on the left side.  Respiratory: Effort normal and breath sounds normal. No respiratory distress.  GI: Soft. She exhibits no mass. There is no hepatomegaly. There is no tenderness.  Musculoskeletal: She exhibits no edema.  Lymphadenopathy:    She has no cervical adenopathy.  Neurological: She is alert and oriented to person, place, and time. She has normal strength. No cranial nerve deficit. Gait normal.  Skin: Skin is warm. No rash noted. No erythema.  Psychiatric: Her mood appears anxious.  Well groomed, good eye contact.      ASSESSMENT AND PLAN:   Ms. Teresa Lee was seen today for 6 months follow-up.  Orders Placed This Encounter  Procedures  . Basic metabolic panel  . Vitamin B12  . Lipid panel  . LDL cholesterol, direct   Lab Results  Component Value Date   CHOL 140 02/27/2018   HDL 32.90 (L) 02/27/2018   LDLCALC 162 (H) 08/08/2013   LDLDIRECT 56.0 02/27/2018   TRIG 327.0 (H) 02/27/2018   CHOLHDL 4 02/27/2018   Lab Results  Component Value Date   CREATININE 0.78 02/27/2018   BUN 24 (H) 02/27/2018   NA 139 02/27/2018   K 3.5 02/27/2018   CL 102 02/27/2018   CO2 25 02/27/2018     Insomnia, unspecified type We discussed possible pharmacologic option as well as side effects. Explained the importance of a good sleep hygiene. She would like to try doxepin, start with 3 mg and titrate up to 10 mg if needed. She will let me know in a few weeks if medication is helping. Follow-up in 5 months.  -     doxepin (SINEQUAN) 10 MG/ML solution; Take 0.3-0.6 mLs (3-6 mg total) by mouth at bedtime.  Chronic fatigue She has a few chronic health problems that could be causing/aggravating problem.  Also some of her medications  could cause fatigue. Regular, low impact physical activity may help as well as a healthier diet. Hopefully problem with improved with a better sleep.  Essential  hypertension BP adequately controlled. Low-salt diet recommended. Recommend monitoring BP regularly. Eye exam is current. Follow-up in 5 months, before if needed.  -     Basic metabolic panel  Mixed hyperlipidemia Simvastatin 40 mg will be sent to her pharmacy. Further recommendations will be given according to lipid panel results.  -     Lipid panel  Hypokalemia No changes in current management, will follow labs done today and will give further recommendations accordingly.  -     Basic metabolic panel  J28 deficiency B12 1000 mcg IM given today. We will adjust frequency according to B12 results.  -     Vitamin B12 -     cyanocobalamin ((VITAMIN B-12)) injection 1,000 mcg  Crohn's disease without complication, unspecified gastrointestinal tract location (HCC) Symptomatic at this time. Recommend continue following with GI.  Other orders -     LDL cholesterol, direct     Teresa Lee G. [place], MD  [facility]. [place] office.

## 2018-02-27 NOTE — Patient Instructions (Addendum)
A few things to remember from today's visit:   Essential hypertension - Plan: Basic metabolic panel  Mixed hyperlipidemia - Plan: Lipid panel  Hypokalemia - Plan: Basic metabolic panel  O83 deficiency - Plan: Vitamin B12, cyanocobalamin ((VITAMIN B-12)) injection 1,000 mcg  Insomnia, unspecified type - Plan: doxepin (SINEQUAN) 10 MG/ML solution  Chronic fatigue  Crohn's disease without complication, unspecified gastrointestinal tract location (HCC)  Doxepin added today, you are going to start with 0.3 mL at bedtime, you can increase it to 0.6 mL and 1 mL if needed to obtain ability sleep. Please let me know if doxepin is helping, we can continue titrating dose. Take Celexa in the morning instead at night.  Keep appointment with neurologist. Please call due to GI report GI symptoms.  Please be sure medication list is accurate. If a new problem present, please set up appointment sooner than planned today.

## 2018-03-05 MED ORDER — SIMVASTATIN 40 MG PO TABS: 40.0000 mg | ORAL_TABLET | Freq: Every day | ORAL | 3 refills | Status: DC

## 2018-03-15 DIAGNOSIS — R197 Diarrhea, unspecified: Secondary | ICD-10-CM | POA: Diagnosis not present

## 2018-03-15 DIAGNOSIS — K5080 Crohn's disease of both small and large intestine without complications: Secondary | ICD-10-CM | POA: Diagnosis not present

## 2018-03-15 DIAGNOSIS — R110 Nausea: Secondary | ICD-10-CM | POA: Diagnosis not present

## 2018-03-15 DIAGNOSIS — R1084 Generalized abdominal pain: Secondary | ICD-10-CM | POA: Diagnosis not present

## 2018-03-18 DIAGNOSIS — M79604 Pain in right leg: Secondary | ICD-10-CM | POA: Diagnosis not present

## 2018-03-18 DIAGNOSIS — D649 Anemia, unspecified: Secondary | ICD-10-CM | POA: Diagnosis not present

## 2018-03-18 DIAGNOSIS — K5080 Crohn's disease of both small and large intestine without complications: Secondary | ICD-10-CM | POA: Diagnosis not present

## 2018-03-18 DIAGNOSIS — G894 Chronic pain syndrome: Secondary | ICD-10-CM | POA: Diagnosis not present

## 2018-03-18 DIAGNOSIS — M47817 Spondylosis without myelopathy or radiculopathy, lumbosacral region: Secondary | ICD-10-CM | POA: Diagnosis not present

## 2018-03-18 DIAGNOSIS — M47812 Spondylosis without myelopathy or radiculopathy, cervical region: Secondary | ICD-10-CM | POA: Diagnosis not present

## 2018-03-18 DIAGNOSIS — Z79899 Other long term (current) drug therapy: Secondary | ICD-10-CM | POA: Diagnosis not present

## 2018-03-18 DIAGNOSIS — Z79891 Long term (current) use of opiate analgesic: Secondary | ICD-10-CM | POA: Diagnosis not present

## 2018-03-19 ENCOUNTER — Other Ambulatory Visit: Payer: Self-pay | Admitting: Family Medicine

## 2018-03-19 DIAGNOSIS — I10 Essential (primary) hypertension: Secondary | ICD-10-CM

## 2018-03-19 DIAGNOSIS — K5080 Crohn's disease of both small and large intestine without complications: Secondary | ICD-10-CM | POA: Diagnosis not present

## 2018-03-27 DIAGNOSIS — Z5181 Encounter for therapeutic drug level monitoring: Secondary | ICD-10-CM | POA: Diagnosis not present

## 2018-03-27 DIAGNOSIS — G7000 Myasthenia gravis without (acute) exacerbation: Secondary | ICD-10-CM | POA: Diagnosis not present

## 2018-03-28 ENCOUNTER — Other Ambulatory Visit: Payer: Self-pay | Admitting: Family Medicine

## 2018-03-28 DIAGNOSIS — I10 Essential (primary) hypertension: Secondary | ICD-10-CM

## 2018-03-29 DIAGNOSIS — K5080 Crohn's disease of both small and large intestine without complications: Secondary | ICD-10-CM | POA: Diagnosis not present

## 2018-04-01 DIAGNOSIS — M47817 Spondylosis without myelopathy or radiculopathy, lumbosacral region: Secondary | ICD-10-CM | POA: Diagnosis not present

## 2018-04-01 DIAGNOSIS — M5136 Other intervertebral disc degeneration, lumbar region: Secondary | ICD-10-CM | POA: Diagnosis not present

## 2018-04-01 DIAGNOSIS — M5417 Radiculopathy, lumbosacral region: Secondary | ICD-10-CM | POA: Diagnosis not present

## 2018-04-10 ENCOUNTER — Ambulatory Visit: Payer: Medicare HMO

## 2018-04-10 DIAGNOSIS — D2272 Melanocytic nevi of left lower limb, including hip: Secondary | ICD-10-CM | POA: Diagnosis not present

## 2018-04-10 DIAGNOSIS — D225 Melanocytic nevi of trunk: Secondary | ICD-10-CM | POA: Diagnosis not present

## 2018-04-10 DIAGNOSIS — D2271 Melanocytic nevi of right lower limb, including hip: Secondary | ICD-10-CM | POA: Diagnosis not present

## 2018-04-10 DIAGNOSIS — D2261 Melanocytic nevi of right upper limb, including shoulder: Secondary | ICD-10-CM | POA: Diagnosis not present

## 2018-04-10 DIAGNOSIS — L821 Other seborrheic keratosis: Secondary | ICD-10-CM | POA: Diagnosis not present

## 2018-04-10 DIAGNOSIS — Z85820 Personal history of malignant melanoma of skin: Secondary | ICD-10-CM | POA: Diagnosis not present

## 2018-04-15 DIAGNOSIS — M79604 Pain in right leg: Secondary | ICD-10-CM | POA: Diagnosis not present

## 2018-04-15 DIAGNOSIS — Z79899 Other long term (current) drug therapy: Secondary | ICD-10-CM | POA: Diagnosis not present

## 2018-04-15 DIAGNOSIS — M542 Cervicalgia: Secondary | ICD-10-CM | POA: Diagnosis not present

## 2018-04-15 DIAGNOSIS — G894 Chronic pain syndrome: Secondary | ICD-10-CM | POA: Diagnosis not present

## 2018-04-15 DIAGNOSIS — Z79891 Long term (current) use of opiate analgesic: Secondary | ICD-10-CM | POA: Diagnosis not present

## 2018-04-15 DIAGNOSIS — M47817 Spondylosis without myelopathy or radiculopathy, lumbosacral region: Secondary | ICD-10-CM | POA: Diagnosis not present

## 2018-04-18 ENCOUNTER — Other Ambulatory Visit: Payer: Self-pay | Admitting: Family Medicine

## 2018-04-18 DIAGNOSIS — R110 Nausea: Secondary | ICD-10-CM

## 2018-04-24 ENCOUNTER — Other Ambulatory Visit: Payer: Self-pay | Admitting: Family Medicine

## 2018-04-24 DIAGNOSIS — I10 Essential (primary) hypertension: Secondary | ICD-10-CM

## 2018-04-25 DIAGNOSIS — R351 Nocturia: Secondary | ICD-10-CM | POA: Diagnosis not present

## 2018-04-25 DIAGNOSIS — N393 Stress incontinence (female) (male): Secondary | ICD-10-CM | POA: Diagnosis not present

## 2018-05-13 DIAGNOSIS — M47817 Spondylosis without myelopathy or radiculopathy, lumbosacral region: Secondary | ICD-10-CM | POA: Diagnosis not present

## 2018-05-13 DIAGNOSIS — M25551 Pain in right hip: Secondary | ICD-10-CM | POA: Diagnosis not present

## 2018-05-13 DIAGNOSIS — M79604 Pain in right leg: Secondary | ICD-10-CM | POA: Diagnosis not present

## 2018-05-13 DIAGNOSIS — G894 Chronic pain syndrome: Secondary | ICD-10-CM | POA: Diagnosis not present

## 2018-05-13 DIAGNOSIS — M47812 Spondylosis without myelopathy or radiculopathy, cervical region: Secondary | ICD-10-CM | POA: Diagnosis not present

## 2018-05-17 ENCOUNTER — Other Ambulatory Visit: Payer: Self-pay | Admitting: Family Medicine

## 2018-05-17 DIAGNOSIS — E559 Vitamin D deficiency, unspecified: Secondary | ICD-10-CM

## 2018-05-20 IMAGING — DX DG CHEST 2V
2 series · 2 of 2 positions shown · non-contrast
Comparison: October 26, 2016

CLINICAL DATA: Shortness of Breath

EXAM:
CHEST  2 VIEW

[chest lat]
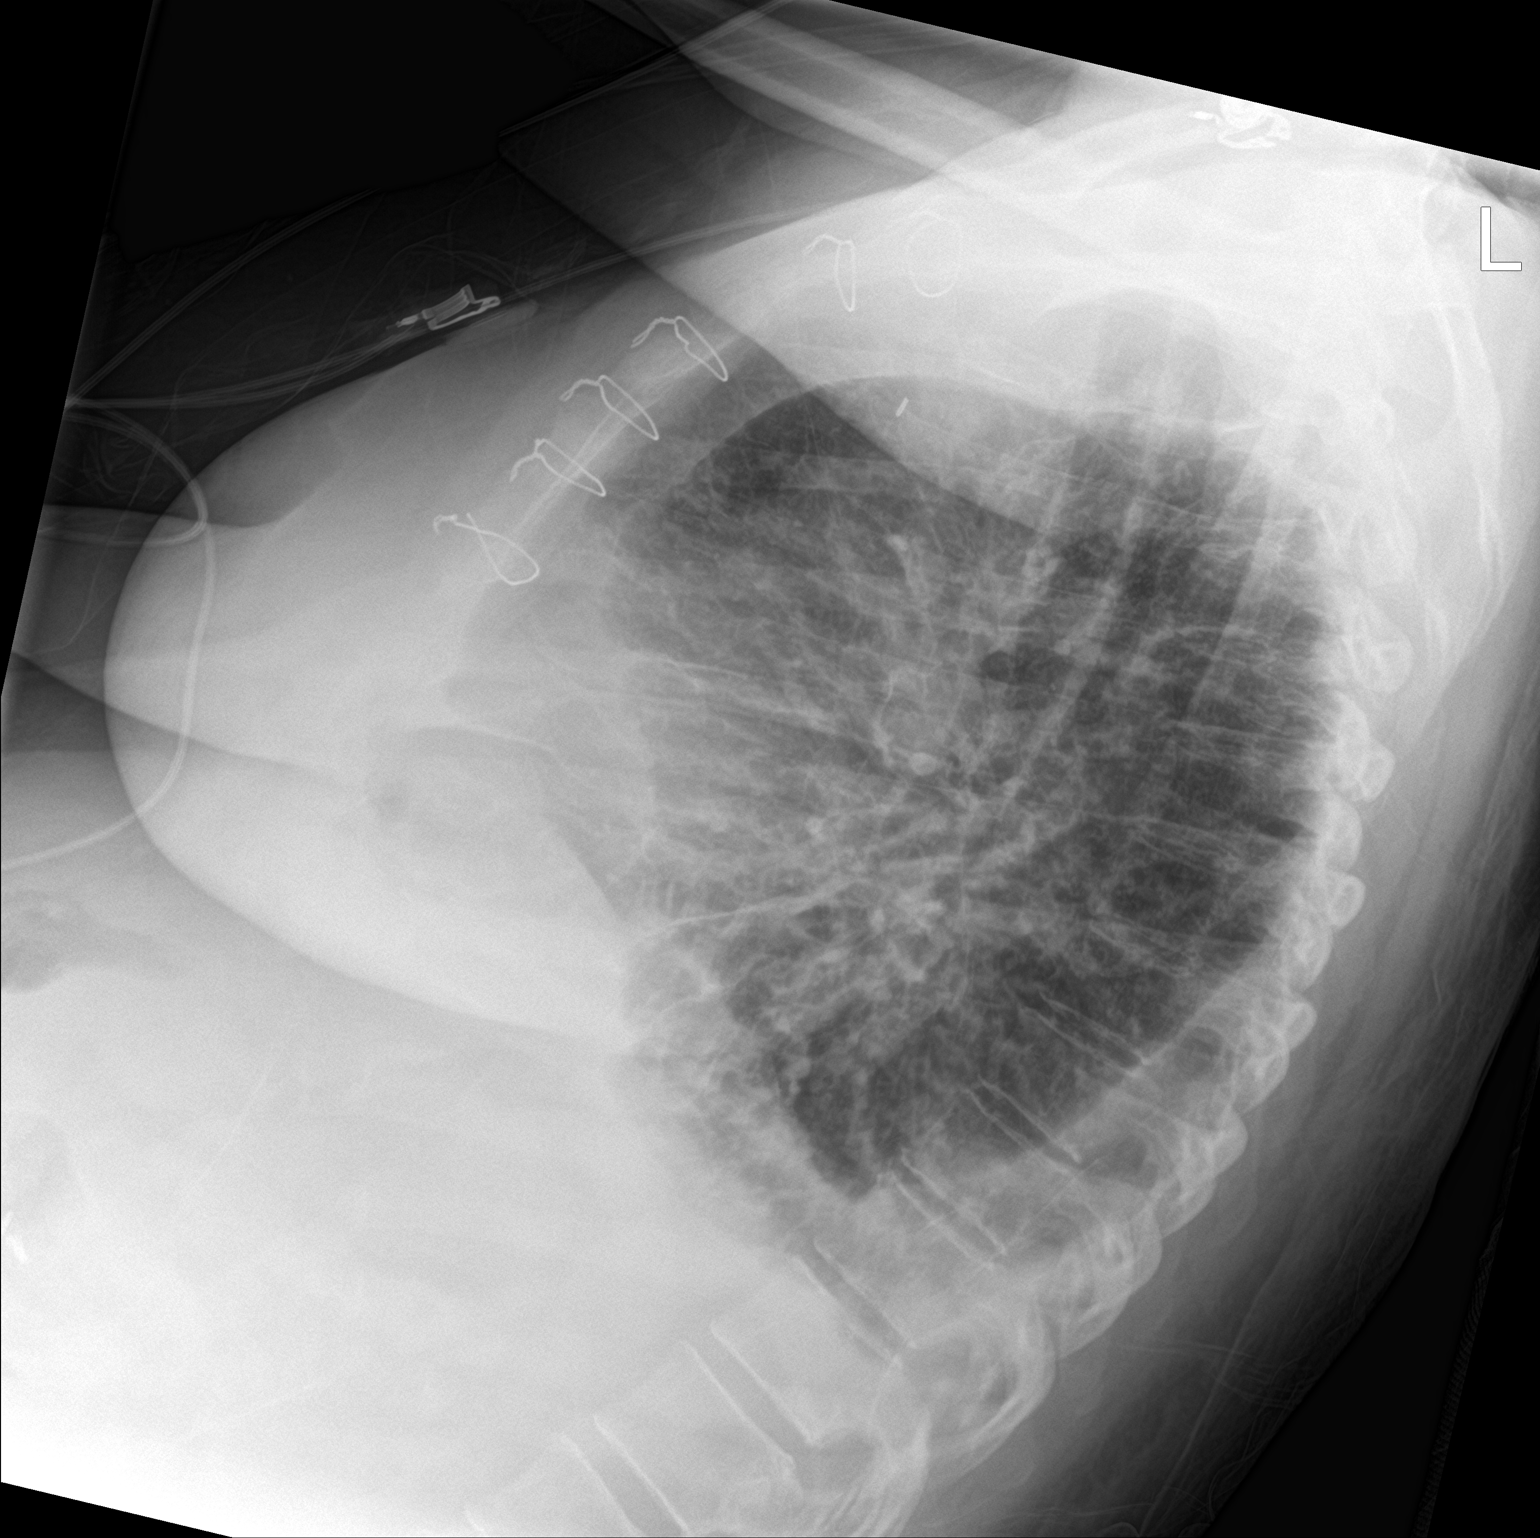

[chest ap]
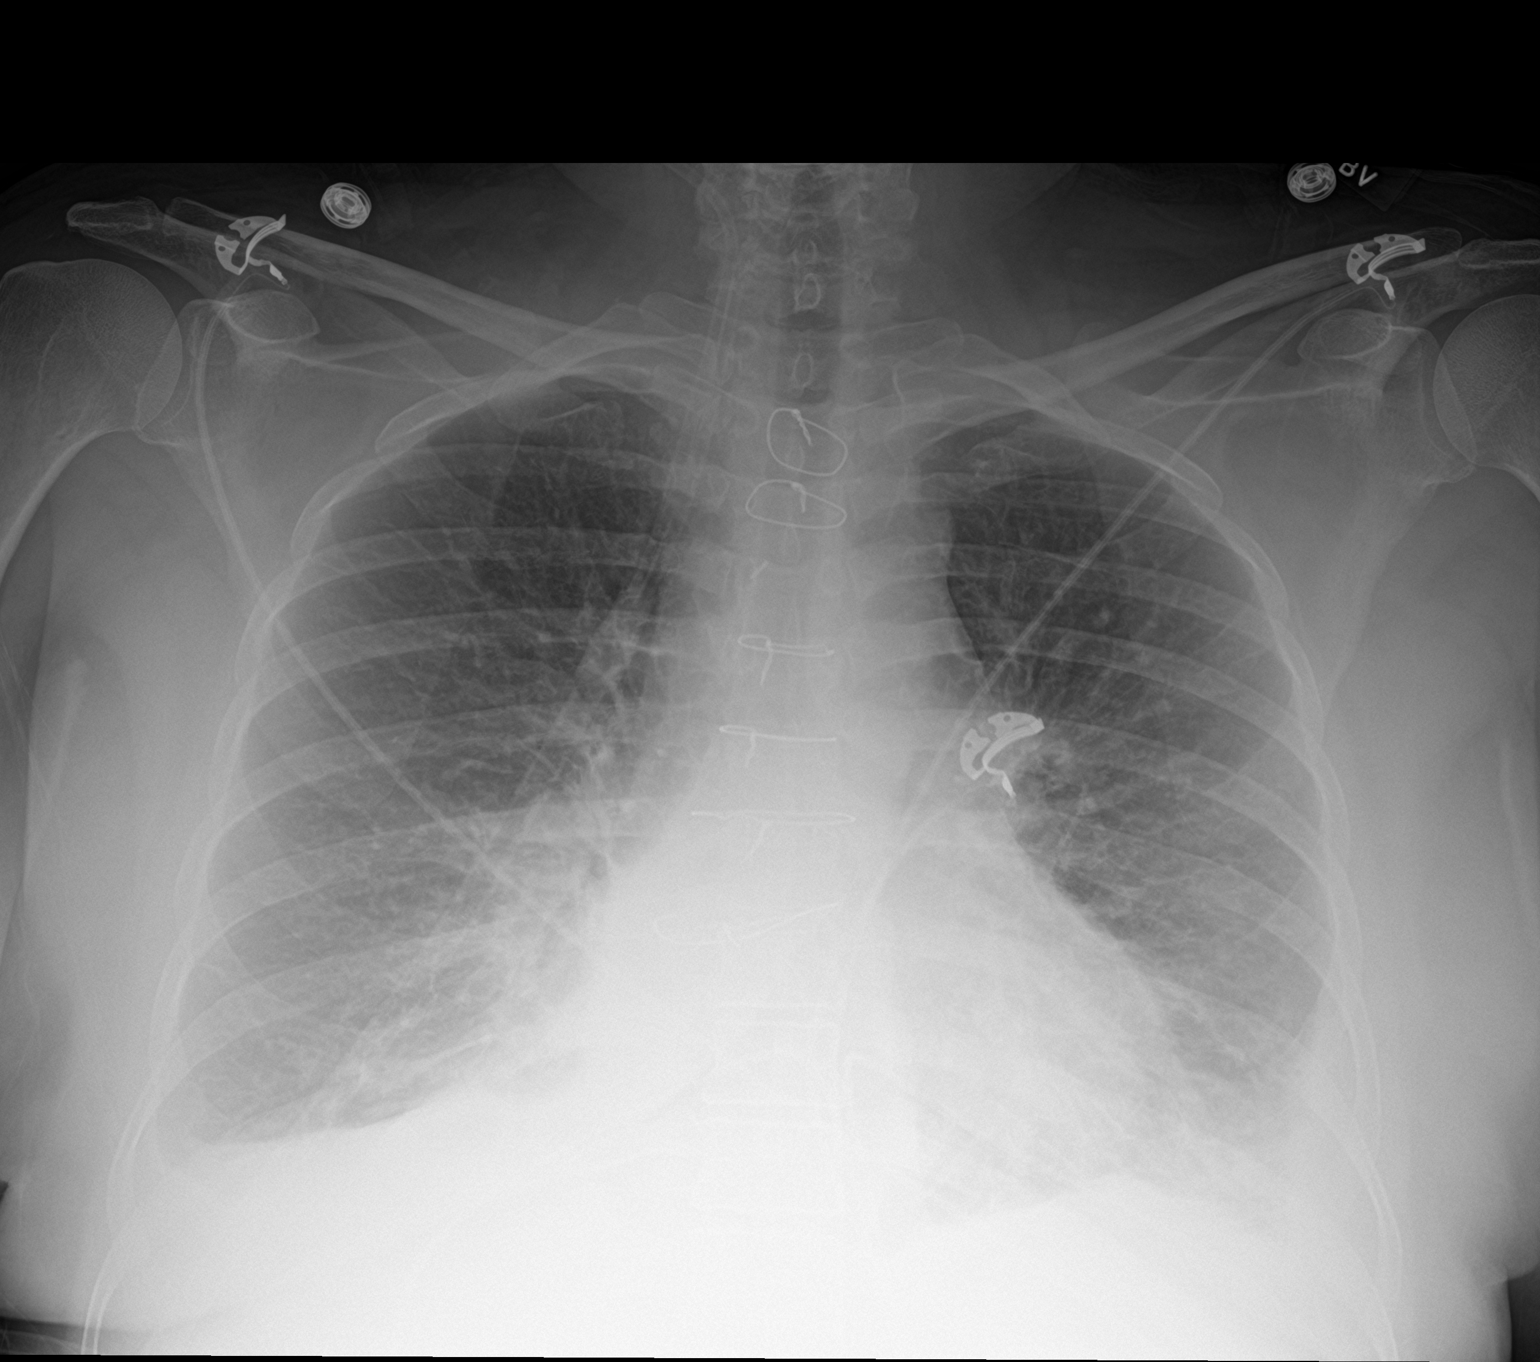

[2 of 2 positions shown; findings below may reference images not displayed]

FINDINGS: There are small pleural effusions bilaterally. There is mild
bibasilar atelectasis. There is no edema or consolidation. Heart is
mildly enlarged with pulmonary vascularity within normal limits.
Patient is status post median sternotomy. No evident adenopathy. No
bone lesions.
IMPRESSION: Small pleural effusions bilaterally with bibasilar atelectasis. No
frank edema or consolidation. Heart prominent with pulmonary
vascularity within normal limits.

## 2018-05-22 ENCOUNTER — Ambulatory Visit (INDEPENDENT_AMBULATORY_CARE_PROVIDER_SITE_OTHER): Payer: Medicare HMO | Admitting: *Deleted

## 2018-05-22 DIAGNOSIS — E538 Deficiency of other specified B group vitamins: Secondary | ICD-10-CM | POA: Diagnosis not present

## 2018-05-22 MED ORDER — CYANOCOBALAMIN 1000 MCG/ML IJ SOLN
1000.0000 ug | Freq: Once | INTRAMUSCULAR | Status: AC
  Administered 2018-05-22: 1000 ug via INTRAMUSCULAR

## 2018-05-22 NOTE — Progress Notes (Signed)
Per orders of Dr. Jordan, injection of Cyanocobalamin 1000mcg given by Honor Fairbank A. Patient tolerated injection well.  

## 2018-05-24 ENCOUNTER — Other Ambulatory Visit: Payer: Self-pay | Admitting: Family Medicine

## 2018-05-24 DIAGNOSIS — M797 Fibromyalgia: Secondary | ICD-10-CM

## 2018-05-27 ENCOUNTER — Other Ambulatory Visit: Payer: Self-pay | Admitting: Family Medicine

## 2018-05-27 DIAGNOSIS — K219 Gastro-esophageal reflux disease without esophagitis: Secondary | ICD-10-CM

## 2018-06-10 DIAGNOSIS — G894 Chronic pain syndrome: Secondary | ICD-10-CM | POA: Diagnosis not present

## 2018-06-10 DIAGNOSIS — M5136 Other intervertebral disc degeneration, lumbar region: Secondary | ICD-10-CM | POA: Diagnosis not present

## 2018-06-10 DIAGNOSIS — M5030 Other cervical disc degeneration, unspecified cervical region: Secondary | ICD-10-CM | POA: Diagnosis not present

## 2018-06-10 DIAGNOSIS — M25559 Pain in unspecified hip: Secondary | ICD-10-CM | POA: Diagnosis not present

## 2018-06-11 ENCOUNTER — Other Ambulatory Visit: Payer: Self-pay | Admitting: *Deleted

## 2018-06-11 MED ORDER — ALBUTEROL SULFATE HFA 108 (90 BASE) MCG/ACT IN AERS: 1.0000 | INHALATION_SPRAY | Freq: Four times a day (QID) | RESPIRATORY_TRACT | 2 refills | Status: DC | PRN

## 2018-06-25 ENCOUNTER — Other Ambulatory Visit: Payer: Self-pay | Admitting: Family Medicine

## 2018-06-25 DIAGNOSIS — G4700 Insomnia, unspecified: Secondary | ICD-10-CM

## 2018-07-01 ENCOUNTER — Other Ambulatory Visit: Payer: Self-pay | Admitting: Family Medicine

## 2018-07-01 DIAGNOSIS — G4700 Insomnia, unspecified: Secondary | ICD-10-CM

## 2018-07-01 MED ORDER — DOXEPIN HCL 10 MG/ML PO CONC: 3.0000 mg | Freq: Every day | ORAL | 1 refills | Status: DC

## 2018-07-02 ENCOUNTER — Other Ambulatory Visit: Payer: Self-pay | Admitting: Family Medicine

## 2018-07-02 ENCOUNTER — Other Ambulatory Visit: Payer: Self-pay | Admitting: *Deleted

## 2018-07-02 MED ORDER — ALBUTEROL SULFATE HFA 108 (90 BASE) MCG/ACT IN AERS: 1.0000 | INHALATION_SPRAY | Freq: Four times a day (QID) | RESPIRATORY_TRACT | 2 refills | Status: DC | PRN

## 2018-07-02 MED ORDER — DOXEPIN HCL 10 MG PO CAPS: 10.0000 mg | ORAL_CAPSULE | Freq: Every day | ORAL | 0 refills | Status: DC

## 2018-07-02 NOTE — Telephone Encounter (Signed)
Is Ms Asebedo taking this med? What dose? Thanks, BJ

## 2018-07-02 NOTE — Telephone Encounter (Signed)
Patient is taking medication, would like pill instead.

## 2018-07-08 DIAGNOSIS — M5030 Other cervical disc degeneration, unspecified cervical region: Secondary | ICD-10-CM | POA: Diagnosis not present

## 2018-07-08 DIAGNOSIS — G894 Chronic pain syndrome: Secondary | ICD-10-CM | POA: Diagnosis not present

## 2018-07-08 DIAGNOSIS — M25559 Pain in unspecified hip: Secondary | ICD-10-CM | POA: Diagnosis not present

## 2018-07-08 DIAGNOSIS — M5136 Other intervertebral disc degeneration, lumbar region: Secondary | ICD-10-CM | POA: Diagnosis not present

## 2018-07-09 ENCOUNTER — Other Ambulatory Visit: Payer: Self-pay | Admitting: Pain Medicine

## 2018-07-09 DIAGNOSIS — M545 Low back pain, unspecified: Secondary | ICD-10-CM

## 2018-07-22 ENCOUNTER — Other Ambulatory Visit: Payer: Self-pay | Admitting: Family Medicine

## 2018-07-22 DIAGNOSIS — I10 Essential (primary) hypertension: Secondary | ICD-10-CM

## 2018-08-05 DIAGNOSIS — M5030 Other cervical disc degeneration, unspecified cervical region: Secondary | ICD-10-CM | POA: Diagnosis not present

## 2018-08-05 DIAGNOSIS — G894 Chronic pain syndrome: Secondary | ICD-10-CM | POA: Diagnosis not present

## 2018-08-05 DIAGNOSIS — M47817 Spondylosis without myelopathy or radiculopathy, lumbosacral region: Secondary | ICD-10-CM | POA: Diagnosis not present

## 2018-08-05 DIAGNOSIS — Z79891 Long term (current) use of opiate analgesic: Secondary | ICD-10-CM | POA: Diagnosis not present

## 2018-08-05 DIAGNOSIS — M5136 Other intervertebral disc degeneration, lumbar region: Secondary | ICD-10-CM | POA: Diagnosis not present

## 2018-08-05 DIAGNOSIS — Z79899 Other long term (current) drug therapy: Secondary | ICD-10-CM | POA: Diagnosis not present

## 2018-08-06 ENCOUNTER — Other Ambulatory Visit: Payer: Self-pay | Admitting: Family Medicine

## 2018-08-06 DIAGNOSIS — G2581 Restless legs syndrome: Secondary | ICD-10-CM

## 2018-08-14 DIAGNOSIS — G7089 Other specified myoneural disorders: Secondary | ICD-10-CM | POA: Diagnosis not present

## 2018-08-14 DIAGNOSIS — K5090 Crohn's disease, unspecified, without complications: Secondary | ICD-10-CM | POA: Diagnosis not present

## 2018-08-14 DIAGNOSIS — G7000 Myasthenia gravis without (acute) exacerbation: Secondary | ICD-10-CM | POA: Diagnosis not present

## 2018-08-14 DIAGNOSIS — M797 Fibromyalgia: Secondary | ICD-10-CM | POA: Diagnosis not present

## 2018-08-14 DIAGNOSIS — Q044 Septo-optic dysplasia of brain: Secondary | ICD-10-CM | POA: Diagnosis not present

## 2018-08-14 DIAGNOSIS — M5416 Radiculopathy, lumbar region: Secondary | ICD-10-CM | POA: Diagnosis not present

## 2018-08-14 DIAGNOSIS — Z79899 Other long term (current) drug therapy: Secondary | ICD-10-CM | POA: Diagnosis not present

## 2018-08-14 DIAGNOSIS — G7001 Myasthenia gravis with (acute) exacerbation: Secondary | ICD-10-CM | POA: Diagnosis not present

## 2018-08-15 DIAGNOSIS — G7000 Myasthenia gravis without (acute) exacerbation: Secondary | ICD-10-CM | POA: Diagnosis not present

## 2018-08-15 DIAGNOSIS — M5416 Radiculopathy, lumbar region: Secondary | ICD-10-CM | POA: Diagnosis not present

## 2018-08-23 ENCOUNTER — Other Ambulatory Visit: Payer: Self-pay | Admitting: Family Medicine

## 2018-08-23 DIAGNOSIS — M797 Fibromyalgia: Secondary | ICD-10-CM

## 2018-08-23 DIAGNOSIS — E559 Vitamin D deficiency, unspecified: Secondary | ICD-10-CM

## 2018-08-27 ENCOUNTER — Ambulatory Visit
Admission: RE | Admit: 2018-08-27 | Discharge: 2018-08-27 | Disposition: A | Discharge status: Home / Self Care | Payer: Medicare HMO | Source: Ambulatory Visit | Attending: Pain Medicine | Admitting: Pain Medicine

## 2018-08-27 ENCOUNTER — Other Ambulatory Visit: Payer: Self-pay

## 2018-08-27 DIAGNOSIS — M545 Low back pain, unspecified: Secondary | ICD-10-CM

## 2018-08-27 DIAGNOSIS — M48061 Spinal stenosis, lumbar region without neurogenic claudication: Secondary | ICD-10-CM | POA: Diagnosis not present

## 2018-08-29 DIAGNOSIS — B9721 SARS-associated coronavirus as the cause of diseases classified elsewhere: Secondary | ICD-10-CM | POA: Diagnosis not present

## 2018-08-30 ENCOUNTER — Ambulatory Visit (INDEPENDENT_AMBULATORY_CARE_PROVIDER_SITE_OTHER): Payer: Medicare HMO | Admitting: Family Medicine

## 2018-08-30 ENCOUNTER — Encounter: Payer: Self-pay | Admitting: Family Medicine

## 2018-08-30 ENCOUNTER — Other Ambulatory Visit: Payer: Self-pay

## 2018-08-30 VITALS — BP 130/76 | HR 62 | Temp 98.7°F | Resp 12 | Ht 61.0 in | Wt 152.2 lb

## 2018-08-30 DIAGNOSIS — I10 Essential (primary) hypertension: Secondary | ICD-10-CM | POA: Diagnosis not present

## 2018-08-30 DIAGNOSIS — F419 Anxiety disorder, unspecified: Secondary | ICD-10-CM

## 2018-08-30 DIAGNOSIS — E782 Mixed hyperlipidemia: Secondary | ICD-10-CM | POA: Diagnosis not present

## 2018-08-30 DIAGNOSIS — E538 Deficiency of other specified B group vitamins: Secondary | ICD-10-CM | POA: Diagnosis not present

## 2018-08-30 DIAGNOSIS — R69 Illness, unspecified: Secondary | ICD-10-CM | POA: Diagnosis not present

## 2018-08-30 DIAGNOSIS — J4520 Mild intermittent asthma, uncomplicated: Secondary | ICD-10-CM

## 2018-08-30 DIAGNOSIS — E559 Vitamin D deficiency, unspecified: Secondary | ICD-10-CM

## 2018-08-30 LAB — BASIC METABOLIC PANEL
BUN: 19 mg/dL (ref 6–23)
CO2: 22 mEq/L (ref 19–32)
Calcium: 9.3 mg/dL (ref 8.4–10.5)
Chloride: 101 mEq/L (ref 96–112)
Creatinine, Ser: 0.82 mg/dL (ref 0.40–1.20)
GFR: 71.97 mL/min (ref >60.00)
Glucose, Bld: 112 mg/dL — ABNORMAL HIGH (ref 70–99)
Potassium: 3.5 mEq/L (ref 3.5–5.1)
Sodium: 140 mEq/L (ref 135–145)

## 2018-08-30 LAB — LIPID PANEL
Cholesterol: 189 mg/dL (ref 0–200)
HDL: 34.2 mg/dL — ABNORMAL LOW (ref >39.00)
Total CHOL/HDL Ratio: 6
Triglycerides: 662 mg/dL — ABNORMAL HIGH (ref 0.0–149.0)

## 2018-08-30 LAB — VITAMIN D 25 HYDROXY (VIT D DEFICIENCY, FRACTURES): VITD: 33.49 ng/mL (ref 30.00–100.00)

## 2018-08-30 LAB — LDL CHOLESTEROL, DIRECT: Direct LDL: 47 mg/dL

## 2018-08-30 LAB — VITAMIN B12: Vitamin B-12: >1500 pg/mL — ABNORMAL HIGH (ref 211–911)

## 2018-08-30 MED ORDER — CYANOCOBALAMIN 1000 MCG/ML IJ SOLN
1000.0000 ug | Freq: Once | INTRAMUSCULAR | Status: AC
  Administered 2018-08-30: 1000 ug via INTRAMUSCULAR

## 2018-08-30 MED ORDER — BUDESONIDE-FORMOTEROL FUMARATE 80-4.5 MCG/ACT IN AERO: 2.0000 | INHALATION_SPRAY | Freq: Two times a day (BID) | RESPIRATORY_TRACT | 3 refills | Status: DC

## 2018-08-30 MED ORDER — AEROCHAMBER PLUS MISC: 1 refills | Status: AC

## 2018-08-30 NOTE — Patient Instructions (Signed)
A few things to remember from today's visit:   Essential hypertension - Plan: Basic metabolic panel  Anxiety disorder, unspecified type  Insomnia, unspecified type  B12 deficiency - Plan: Vitamin B12  Mixed hyperlipidemia - Plan: Lipid panel  Vitamin D deficiency, unspecified - Plan: VITAMIN D 25 Hydroxy (Vit-D Deficiency, Fractures)  Please let me know in 6 weeks if Symbicort 2 puff 2 times daily has helped. No other change today, further recommendation will be given according to lab results.  If nausea is persistent, please arrange appointment with your gastroenterologist.  Please be sure medication list is accurate. If a new problem present, please set up appointment sooner than planned today.

## 2018-08-30 NOTE — Assessment & Plan Note (Addendum)
Continue B12 1000 mcg IM every 6 weeks. Today after verbal consent she received B12 1000 mcg IM x 1. B12 supplementation will be adjusted according to B12 levels.

## 2018-08-30 NOTE — Progress Notes (Signed)
HPI:   [name] is a 58 y.o. female, who is here today for chronic disease management.  Since her last OV she has followed with pain management,neurologist (myastenia gravis),and GI (Crohns disease). Reporting that medication (Mycophenolat) for myasthenia was adjusted because symptoms are getting worse. Mycophenolat is causing diarrhea.  She is on Meloxicam 15 mg,which she takes daily for arthralgias and myalgias. Tolerating medication well,no side effects reported.   HTN,currently she is on Triamterene-HCTZ 37.5-25 mg daily and Losartan 50 mg daily. Denies severe/frequent headache, visual changes, chest pain, dyspnea, palpitation,,new focal weakness, or edema.  Lab Results  Component Value Date   CREATININE 0.78 02/27/2018   BUN 24 (H) 02/27/2018   NA 139 02/27/2018   K 3.5 02/27/2018   CL 102 02/27/2018   CO2 25 02/27/2018    HLD: Last OV recommend Simvastatin to be increased from 20 mg to 40 mg. She is trying to do "better" with following low fat diet. She has not noted side effects but wonders if statin med could be aggravating myasthenia gravis symptoms.   Lab Results  Component Value Date   CHOL 140 02/27/2018   HDL 32.90 (L) 02/27/2018   LDLCALC 162 (H) 08/08/2013   LDLDIRECT 56.0 02/27/2018   TRIG 327.0 (H) 02/27/2018   CHOLHDL 4 02/27/2018   Vit D deficiency, she is on Ergocalciferol 50,000 U weekly. Last 25 OH vit D was normal at 36.6.  Depression and anxiety, she is not longer on Citalopram. She discontinued men a few months ago and noted no difference. Problems are stable. Negative for suicidal thoughts.  She is taking Doxepin 10 mg for insomnia. She does not report side effects. Last refill 07/02/18.  B12 deficiency,she is on B12 1000 mcg IM q 6 weeks. Last B12 in 02/2018 was elevated, > 1525.  She is c/o worsening asthma symptoms. Intermittent episodes of wheezing and mild SOB,no cough ,exacerbated by exertion and alleviated  by rest. She is using Albuterol inh a few times per day,it helps with symptoms. Occasionally she uses Albuterol neb in case inh does not help.  Negative for anosmia and ageustia. No sick contact.  Review of Systems  Constitutional: Positive for fatigue (No more than usual). Negative for appetite change, chills and fever.  HENT: Negative for nosebleeds and sore throat.   Gastrointestinal: Negative for abdominal pain, diarrhea and vomiting.  Genitourinary: Negative for decreased urine volume and hematuria.  Skin: Negative for rash and wound.  Neurological: Negative for syncope.  Psychiatric/Behavioral: Negative for confusion. The patient is nervous/anxious.   Rest see pertinent positives and negatives per HPI.   Current Outpatient Medications on File Prior to Visit  Medication Sig Dispense Refill  . albuterol (PROVENTIL HFA) 108 (90 Base) MCG/ACT inhaler Inhale 1 puff into the lungs every 6 (six) hours as needed for wheezing or shortness of breath. 18 g 2  . albuterol (PROVENTIL) (2.5 MG/3ML) 0.083% nebulizer solution Take 2.5 mg by nebulization every 6 (six) hours as needed for Wheezing.    . APRISO 0.375 g 24 hr capsule Take by mouth daily.     . Cholecalciferol (VITAMIN D-3) 5000 UNITS TABS Take 1 tablet by mouth daily.    Marland Kitchen desmopressin (DDAVP) 0.1 MG tablet Take 0.3 mg by mouth daily.     Marland Kitchen doxepin (SINEQUAN) 10 MG capsule Take 1 capsule (10 mg total) by mouth at bedtime. 90 capsule 0  . estradiol (ESTRACE) 2 MG tablet Take 2 mg by mouth 2 (two)  times daily.     Marland Kitchen HYDROcodone-acetaminophen (NORCO) 10-325 MG tablet Take 2 tablets by mouth every 6 (six) hours as needed.     Marland Kitchen losartan (COZAAR) 50 MG tablet TAKE 1 TABLET DAILY 90 tablet 0  . meloxicam (MOBIC) 15 MG tablet TAKE 1 TABLET DAILY 90 tablet 0  . mycophenolate (CELLCEPT) 500 MG tablet Take 500 mg by mouth 2 (two) times daily.     . pantoprazole (PROTONIX) 40 MG tablet TAKE 1 TABLET DAILY 90 tablet 0  . potassium chloride SA  (K-DUR,KLOR-CON) 20 MEQ tablet Take 1/2 TO 1 TABLET DAILY 90 tablet 0  . predniSONE (DELTASONE) 10 MG tablet Take 7.5 mg by mouth daily.     . progesterone (PROMETRIUM) 100 MG capsule Take 100 mg by mouth at bedtime.    . pyridostigmine (MESTINON) 60 MG tablet 3 (three) times daily. Take 1/2 tablet 3 times per day    . rOPINIRole (REQUIP) 1 MG tablet TAKE 1 OR 2 TABLETS AT BEDTIME 180 tablet 0  . simvastatin (ZOCOR) 40 MG tablet Take 1 tablet (40 mg total) by mouth at bedtime. (Patient taking differently: Take 40 mg by mouth daily. 2 tablets in the morning) 90 tablet 3  . triamterene-hydrochlorothiazide (DYAZIDE) 37.5-25 MG capsule TAKE (1) CAPSULE DAILY IN THE MORNING. 90 capsule 0  . Vitamin D, Ergocalciferol, (DRISDOL) 1.25 MG (50000 UT) CAPS capsule TAKE 1 CAPSULE EVERY 2 WEEKS 7 capsule 1   No current facility-administered medications on file prior to visit.      Past Medical History:  Diagnosis Date  . Arthritis    osteoarthritis  . Asthma   . Blind left eye   . Cancer ([place])    skin  . Crohn's disease ([place])   . CTS (carpal tunnel syndrome)   . DJD (degenerative joint disease)    Both cervical spine and LS spine  . Fibromyalgia   . GERD (gastroesophageal reflux disease)   . Heart murmur   . Herniated nucleus pulposus   . Hyperlipidemia   . Hypertension   . IBS (irritable bowel syndrome)   . Leukocytosis   . Myasthenia gravis   . Myasthenia gravis ([place])   . Spinal cord stimulator status    Allergies  Allergen Reactions  . Latex Other (See Comments)    BLISTER  . Lorazepam     Other reaction(s): Hallucinations  . Percocet [Oxycodone-Acetaminophen] Hives  . Sulfa Antibiotics Hives  . Sulfamethoxazole-Trimethoprim Hives  . Tapentadol Hcl Other (See Comments)    HEADACHES NUCENTA  . Nucynta [Tapentadol]     Headache   . Orange Concentrate [Flavoring Agent]     Acid reflux  . Other Other (See Comments), Hives and Nausea Only    Some nuts cause a rash  . Tomato      Burns skin  . Bioflavonoids Rash    Causes burning of skin and blisters  . Cefixime     Due to myasthenia gravis    Social History   Socioeconomic History  . Marital status: Married    Spouse name: Not on file  . Number of children: 1  . Years of education: Not on file  . Highest education level: Not on file  Occupational History  . Occupation: disabled  Social Needs  . Financial resource strain: Not on file  . Food insecurity    Worry: Not on file    Inability: Not on file  . Transportation needs    Medical: Not on file  Non-medical: Not on file  Tobacco Use  . Smoking status: Never Smoker  . Smokeless tobacco: Never Used  Substance and Sexual Activity  . Alcohol use: Yes    Comment: occassional wine  . Drug use: No  . Sexual activity: Not on file  Lifestyle  . Physical activity    Days per week: Not on file    Minutes per session: Not on file  . Stress: Not on file  Relationships  . Social Herbalist on phone: Not on file    Gets together: Not on file    Attends religious service: Not on file    Active member of club or organization: Not on file    Attends meetings of clubs or organizations: Not on file    Relationship status: Not on file  Other Topics Concern  . Not on file  Social History Narrative  . Not on file    Vitals:   08/30/18 0945  BP: 130/76  Pulse: 62  Resp: 12  Temp: 98.7 F (37.1 C)  SpO2: 97%   Body mass index is 28.77 kg/m.   Physical Exam  Nursing note and vitals reviewed. Constitutional: She is oriented to person, place, and time. She appears well-developed. No distress.  HENT:  Head: Normocephalic and atraumatic.  Mouth/Throat: Oropharynx is clear and moist and mucous membranes are normal.  Eyes: Conjunctivae are normal. Left pupil is not reactive. Left pupil is round.  Left palpebral ptosis.  Cardiovascular: Normal rate and regular rhythm.  No murmur heard. Pulses:      Dorsalis pedis pulses are 2+ on  the right side and 2+ on the left side.  Respiratory: Effort normal and breath sounds normal. No respiratory distress.  GI: Soft. She exhibits no mass. There is no hepatomegaly. There is no abdominal tenderness.  Musculoskeletal:        General: No edema.  Lymphadenopathy:    She has no cervical adenopathy.  Neurological: She is alert and oriented to person, place, and time. She has normal strength. Gait normal.  Skin: Skin is warm. No rash noted. No erythema.  Psychiatric: Her mood appears anxious.  Well groomed, good eye contact.   ASSESSMENT AND PLAN:  Ms. Teresa Lee was seen today for follow-up.  Diagnoses and all orders for this visit:  Orders Placed This Encounter  Procedures  . VITAMIN D 25 Hydroxy (Vit-D Deficiency, Fractures)  . Vitamin B12  . Basic metabolic panel  . Lipid panel  . LDL cholesterol, direct   Lab Results  Component Value Date   CREATININE 0.82 08/30/2018   BUN 19 08/30/2018   NA 140 08/30/2018   K 3.5 08/30/2018   CL 101 08/30/2018   CO2 22 08/30/2018   Lab Results  Component Value Date   VITAMINB12 >1500 (H) 08/30/2018   Lab Results  Component Value Date   CHOL 189 08/30/2018   HDL 34.20 (L) 08/30/2018   LDLCALC 162 (H) 08/08/2013   LDLDIRECT 47.0 08/30/2018   TRIG (H) 08/30/2018    662.0 Triglyceride is over 400; calculations on Lipids are invalid.   CHOLHDL 6 08/30/2018     Mild intermittent asthma, unspecified whether complicated Lung auscultation normal today. Symbicort inh 80-4.5 mcg bid. Recommend using a spacer with inhalers. No changes in Albuterol inh. She will let me know in 6 weeks if Symbicort has helped. Instructed about warning signs.  -     budesonide-formoterol (SYMBICORT) 80-4.5 MCG/ACT inhaler; Inhale 2 puffs into the lungs 2 (  two) times daily. -     Spacer/Aero-Holding Chambers (AEROCHAMBER PLUS) inhaler; Use as instructed to use with inahaler.   Essential hypertension BP adequately controlled. No changes in  current management. Continue low-salt diet. Recommend monitoring BP regularly. Follow-up in 5 months, before if needed.  Hyperlipidemia We discussed some side effects of simvastatin as well as other statins, I do not think medication is aggravating myasthenia gravis but strongly recommend addressing this with her neurologist. For now she will continue simvastatin 40 mg daily. Continue working on low-fat diet. Further recommendation will be given according to lab results.  B12 deficiency Continue B12 1000 mcg IM every 6 weeks. Today after verbal consent she received B12 1000 mcg IM x 1. B12 supplementation will be adjusted according to B12 levels.  Vitamin D deficiency, unspecified No changes in current management, will follow labs done today and will give further recommendations accordingly.  Anxiety disorder She did not noted significant difference in anxiety after discontinuing citalopram. Doxepin 10 mg may also be helping. For now I am not recommending a new medication. Instructed about warning signs.    Return in about 5 months (around 01/30/2019) for f/u and Medicare.    -Ms. Teresa Lee was advised to return sooner than planned today if new concerns arise.       Matty Vanroekel G. [place], MD  [facility]. [place] office.

## 2018-08-30 NOTE — Assessment & Plan Note (Signed)
No changes in current management, will follow labs done today and will give further recommendations accordingly.  

## 2018-08-30 NOTE — Assessment & Plan Note (Addendum)
She did not noted significant difference in anxiety after discontinuing citalopram. Doxepin 10 mg may also be helping. For now I am not recommending a new medication. Instructed about warning signs.

## 2018-08-30 NOTE — Assessment & Plan Note (Signed)
BP adequately controlled. No changes in current management. Continue low-salt diet. Recommend monitoring BP regularly. Follow-up in 5 months, before if needed.

## 2018-08-30 NOTE — Assessment & Plan Note (Signed)
We discussed some side effects of simvastatin as well as other statins, I do not think medication is aggravating myasthenia gravis but strongly recommend addressing this with her neurologist. For now she will continue simvastatin 40 mg daily. Continue working on low-fat diet. Further recommendation will be given according to lab results.

## 2018-09-02 DIAGNOSIS — G7001 Myasthenia gravis with (acute) exacerbation: Secondary | ICD-10-CM | POA: Diagnosis not present

## 2018-09-03 DIAGNOSIS — G7001 Myasthenia gravis with (acute) exacerbation: Secondary | ICD-10-CM | POA: Diagnosis not present

## 2018-09-04 DIAGNOSIS — G7001 Myasthenia gravis with (acute) exacerbation: Secondary | ICD-10-CM | POA: Diagnosis not present

## 2018-09-06 DIAGNOSIS — M5417 Radiculopathy, lumbosacral region: Secondary | ICD-10-CM | POA: Diagnosis not present

## 2018-09-06 DIAGNOSIS — M47817 Spondylosis without myelopathy or radiculopathy, lumbosacral region: Secondary | ICD-10-CM | POA: Diagnosis not present

## 2018-09-06 DIAGNOSIS — G894 Chronic pain syndrome: Secondary | ICD-10-CM | POA: Diagnosis not present

## 2018-09-06 DIAGNOSIS — M5136 Other intervertebral disc degeneration, lumbar region: Secondary | ICD-10-CM | POA: Diagnosis not present

## 2018-09-10 ENCOUNTER — Other Ambulatory Visit: Payer: Self-pay | Admitting: Family Medicine

## 2018-09-10 DIAGNOSIS — K219 Gastro-esophageal reflux disease without esophagitis: Secondary | ICD-10-CM

## 2018-09-10 MED ORDER — ROSUVASTATIN CALCIUM 40 MG PO TABS: 40.0000 mg | ORAL_TABLET | Freq: Every day | ORAL | 1 refills | Status: DC

## 2018-09-17 ENCOUNTER — Other Ambulatory Visit: Payer: Self-pay | Admitting: Family Medicine

## 2018-09-17 DIAGNOSIS — I10 Essential (primary) hypertension: Secondary | ICD-10-CM

## 2018-09-25 ENCOUNTER — Other Ambulatory Visit: Payer: Self-pay | Admitting: Family Medicine

## 2018-10-04 DIAGNOSIS — M5030 Other cervical disc degeneration, unspecified cervical region: Secondary | ICD-10-CM | POA: Diagnosis not present

## 2018-10-04 DIAGNOSIS — Z79899 Other long term (current) drug therapy: Secondary | ICD-10-CM | POA: Diagnosis not present

## 2018-10-04 DIAGNOSIS — M5136 Other intervertebral disc degeneration, lumbar region: Secondary | ICD-10-CM | POA: Diagnosis not present

## 2018-10-04 DIAGNOSIS — Z79891 Long term (current) use of opiate analgesic: Secondary | ICD-10-CM | POA: Diagnosis not present

## 2018-10-04 DIAGNOSIS — G894 Chronic pain syndrome: Secondary | ICD-10-CM | POA: Diagnosis not present

## 2018-10-04 DIAGNOSIS — M47817 Spondylosis without myelopathy or radiculopathy, lumbosacral region: Secondary | ICD-10-CM | POA: Diagnosis not present

## 2018-10-17 ENCOUNTER — Other Ambulatory Visit: Payer: Self-pay | Admitting: Family Medicine

## 2018-10-17 DIAGNOSIS — I10 Essential (primary) hypertension: Secondary | ICD-10-CM

## 2018-11-08 DIAGNOSIS — G7000 Myasthenia gravis without (acute) exacerbation: Secondary | ICD-10-CM | POA: Diagnosis not present

## 2018-11-08 DIAGNOSIS — G894 Chronic pain syndrome: Secondary | ICD-10-CM | POA: Diagnosis not present

## 2018-11-08 DIAGNOSIS — M5136 Other intervertebral disc degeneration, lumbar region: Secondary | ICD-10-CM | POA: Diagnosis not present

## 2018-11-08 DIAGNOSIS — M47817 Spondylosis without myelopathy or radiculopathy, lumbosacral region: Secondary | ICD-10-CM | POA: Diagnosis not present

## 2018-11-18 DIAGNOSIS — G7000 Myasthenia gravis without (acute) exacerbation: Secondary | ICD-10-CM | POA: Diagnosis not present

## 2018-11-21 ENCOUNTER — Other Ambulatory Visit: Payer: Self-pay | Admitting: Family Medicine

## 2018-11-21 DIAGNOSIS — M797 Fibromyalgia: Secondary | ICD-10-CM

## 2018-12-06 DIAGNOSIS — Z79891 Long term (current) use of opiate analgesic: Secondary | ICD-10-CM | POA: Diagnosis not present

## 2018-12-06 DIAGNOSIS — Z79899 Other long term (current) drug therapy: Secondary | ICD-10-CM | POA: Diagnosis not present

## 2018-12-06 DIAGNOSIS — G894 Chronic pain syndrome: Secondary | ICD-10-CM | POA: Diagnosis not present

## 2018-12-06 DIAGNOSIS — Z1159 Encounter for screening for other viral diseases: Secondary | ICD-10-CM | POA: Diagnosis not present

## 2018-12-06 DIAGNOSIS — M47817 Spondylosis without myelopathy or radiculopathy, lumbosacral region: Secondary | ICD-10-CM | POA: Diagnosis not present

## 2018-12-06 DIAGNOSIS — M5136 Other intervertebral disc degeneration, lumbar region: Secondary | ICD-10-CM | POA: Diagnosis not present

## 2018-12-06 DIAGNOSIS — G7000 Myasthenia gravis without (acute) exacerbation: Secondary | ICD-10-CM | POA: Diagnosis not present

## 2018-12-09 DIAGNOSIS — N62 Hypertrophy of breast: Secondary | ICD-10-CM | POA: Diagnosis not present

## 2018-12-10 DIAGNOSIS — R69 Illness, unspecified: Secondary | ICD-10-CM | POA: Diagnosis not present

## 2018-12-11 ENCOUNTER — Encounter: Payer: Self-pay | Admitting: Family Medicine

## 2018-12-11 DIAGNOSIS — K640 First degree hemorrhoids: Secondary | ICD-10-CM | POA: Diagnosis not present

## 2018-12-11 DIAGNOSIS — K5080 Crohn's disease of both small and large intestine without complications: Secondary | ICD-10-CM | POA: Diagnosis not present

## 2018-12-11 LAB — HM COLONOSCOPY

## 2018-12-12 DIAGNOSIS — Z01419 Encounter for gynecological examination (general) (routine) without abnormal findings: Secondary | ICD-10-CM | POA: Diagnosis not present

## 2018-12-12 DIAGNOSIS — M8588 Other specified disorders of bone density and structure, other site: Secondary | ICD-10-CM | POA: Diagnosis not present

## 2018-12-12 DIAGNOSIS — Z6828 Body mass index (BMI) 28.0-28.9, adult: Secondary | ICD-10-CM | POA: Diagnosis not present

## 2018-12-12 DIAGNOSIS — N958 Other specified menopausal and perimenopausal disorders: Secondary | ICD-10-CM | POA: Diagnosis not present

## 2018-12-12 DIAGNOSIS — Z1231 Encounter for screening mammogram for malignant neoplasm of breast: Secondary | ICD-10-CM | POA: Diagnosis not present

## 2018-12-13 DIAGNOSIS — K5080 Crohn's disease of both small and large intestine without complications: Secondary | ICD-10-CM | POA: Diagnosis not present

## 2018-12-18 ENCOUNTER — Encounter: Payer: Self-pay | Admitting: Family Medicine

## 2018-12-19 ENCOUNTER — Other Ambulatory Visit: Payer: Self-pay | Admitting: Family Medicine

## 2018-12-19 DIAGNOSIS — I10 Essential (primary) hypertension: Secondary | ICD-10-CM

## 2018-12-27 ENCOUNTER — Other Ambulatory Visit: Payer: Self-pay | Admitting: Family Medicine

## 2018-12-27 DIAGNOSIS — K219 Gastro-esophageal reflux disease without esophagitis: Secondary | ICD-10-CM

## 2019-01-01 DIAGNOSIS — G7000 Myasthenia gravis without (acute) exacerbation: Secondary | ICD-10-CM | POA: Diagnosis not present

## 2019-01-01 DIAGNOSIS — M5136 Other intervertebral disc degeneration, lumbar region: Secondary | ICD-10-CM | POA: Diagnosis not present

## 2019-01-01 DIAGNOSIS — M47817 Spondylosis without myelopathy or radiculopathy, lumbosacral region: Secondary | ICD-10-CM | POA: Diagnosis not present

## 2019-01-01 DIAGNOSIS — G894 Chronic pain syndrome: Secondary | ICD-10-CM | POA: Diagnosis not present

## 2019-01-22 ENCOUNTER — Other Ambulatory Visit: Payer: Self-pay | Admitting: Family Medicine

## 2019-01-22 DIAGNOSIS — I10 Essential (primary) hypertension: Secondary | ICD-10-CM

## 2019-01-27 ENCOUNTER — Other Ambulatory Visit: Payer: Self-pay | Admitting: Family Medicine

## 2019-01-27 DIAGNOSIS — G2581 Restless legs syndrome: Secondary | ICD-10-CM

## 2019-01-29 ENCOUNTER — Encounter: Payer: Self-pay | Admitting: Family Medicine

## 2019-01-31 ENCOUNTER — Encounter: Payer: Self-pay | Admitting: Family Medicine

## 2019-01-31 ENCOUNTER — Ambulatory Visit (INDEPENDENT_AMBULATORY_CARE_PROVIDER_SITE_OTHER): Payer: Medicare HMO | Admitting: Family Medicine

## 2019-01-31 ENCOUNTER — Other Ambulatory Visit: Payer: Self-pay

## 2019-01-31 VITALS — BP 140/70 | HR 97 | Temp 97.5°F | Resp 16 | Ht 61.0 in | Wt 149.2 lb

## 2019-01-31 DIAGNOSIS — F419 Anxiety disorder, unspecified: Secondary | ICD-10-CM | POA: Diagnosis not present

## 2019-01-31 DIAGNOSIS — Z23 Encounter for immunization: Secondary | ICD-10-CM | POA: Diagnosis not present

## 2019-01-31 DIAGNOSIS — J4520 Mild intermittent asthma, uncomplicated: Secondary | ICD-10-CM | POA: Diagnosis not present

## 2019-01-31 DIAGNOSIS — E559 Vitamin D deficiency, unspecified: Secondary | ICD-10-CM

## 2019-01-31 DIAGNOSIS — E538 Deficiency of other specified B group vitamins: Secondary | ICD-10-CM | POA: Diagnosis not present

## 2019-01-31 DIAGNOSIS — R0602 Shortness of breath: Secondary | ICD-10-CM

## 2019-01-31 DIAGNOSIS — I10 Essential (primary) hypertension: Secondary | ICD-10-CM | POA: Diagnosis not present

## 2019-01-31 DIAGNOSIS — R69 Illness, unspecified: Secondary | ICD-10-CM | POA: Diagnosis not present

## 2019-01-31 DIAGNOSIS — E782 Mixed hyperlipidemia: Secondary | ICD-10-CM | POA: Diagnosis not present

## 2019-01-31 DIAGNOSIS — K219 Gastro-esophageal reflux disease without esophagitis: Secondary | ICD-10-CM | POA: Diagnosis not present

## 2019-01-31 LAB — COMPREHENSIVE METABOLIC PANEL
ALT: 8 U/L (ref 0–35)
AST: 9 U/L (ref 0–37)
Albumin: 4.1 g/dL (ref 3.5–5.2)
Alkaline Phosphatase: 45 U/L (ref 39–117)
BUN: 25 mg/dL — ABNORMAL HIGH (ref 6–23)
CO2: 29 mEq/L (ref 19–32)
Calcium: 9 mg/dL (ref 8.4–10.5)
Chloride: 98 mEq/L (ref 96–112)
Creatinine, Ser: 0.97 mg/dL (ref 0.40–1.20)
GFR: 59.2 mL/min — ABNORMAL LOW (ref >60.00)
Glucose, Bld: 95 mg/dL (ref 70–99)
Potassium: 3.4 mEq/L — ABNORMAL LOW (ref 3.5–5.1)
Sodium: 140 mEq/L (ref 135–145)
Total Bilirubin: 0.4 mg/dL (ref 0.2–1.2)
Total Protein: 6.9 g/dL (ref 6.0–8.3)

## 2019-01-31 LAB — LIPID PANEL
Cholesterol: 163 mg/dL (ref 0–200)
HDL: 37.3 mg/dL — ABNORMAL LOW (ref >39.00)
Total CHOL/HDL Ratio: 4
Triglycerides: 456 mg/dL — ABNORMAL HIGH (ref 0.0–149.0)

## 2019-01-31 LAB — VITAMIN B12: Vitamin B-12: >1500 pg/mL — ABNORMAL HIGH (ref 211–911)

## 2019-01-31 LAB — VITAMIN D 25 HYDROXY (VIT D DEFICIENCY, FRACTURES): VITD: 32.4 ng/mL (ref 30.00–100.00)

## 2019-01-31 LAB — LDL CHOLESTEROL, DIRECT: Direct LDL: 55 mg/dL

## 2019-01-31 MED ORDER — CYANOCOBALAMIN 1000 MCG/ML IJ SOLN
1000.0000 ug | Freq: Once | INTRAMUSCULAR | Status: AC
  Administered 2019-01-31: 1000 ug via INTRAMUSCULAR

## 2019-01-31 NOTE — Patient Instructions (Addendum)
A few things to remember from today's visit:   Essential hypertension - Plan: CMP  Anxiety disorder, unspecified type  Vitamin D deficiency, unspecified - Plan: CMP, VITAMIN D 25 Hydroxy (Vit-D Deficiency, Fractures)  B12 deficiency - Plan: Vitamin B12  Mixed hyperlipidemia - Plan: CMP, Lipid panel  SOB (shortness of breath) on exertion  Gastroesophageal reflux disease, unspecified whether esophagitis present   Cholesterol is a white, waxy substance similar to fat that the human body needs to help build cells. The liver makes all the cholesterol that a person's body needs. Having high cholesterol (hypercholesterolemia) increases a person's risk for heart disease and stroke. Extra (excess) cholesterol comes from the food the person eats. High cholesterol can often be prevented with diet and lifestyle changes. If you already have high cholesterol, you can control it with diet and lifestyle changes and with medicine.  How can high cholesterol affect me? If you have high cholesterol, deposits (plaques) may build up on the walls of your arteries. The arteries are the blood vessels that carry blood away from your heart. Plaques make the arteries narrower and stiffer. This can limit or block blood flow and cause blood clots to form. Blood clots:  Are tiny balls of cells that form in your blood.  Can move to the heart or brain, causing a heart attack or stroke. Plaques in arteries greatly increase your risk for heart attack and stroke.Making diet and lifestyle changes can reduce your risk for these conditions that may threaten your life. What can increase my risk? This condition is more likely to develop in people who:  Eat foods that are high in saturated fat or cholesterol. Saturated fat is mostly found in: ? Foods that contain animal fat, such as red meat and some dairy products. ? Certain fatty foods made from plants, such as tropical oils.  Are overweight.  Are not getting enough  exercise.  Have a family history of high cholesterol. What actions can I take to prevent this? Nutrition   Eat less saturated fat.  Avoid trans fats (partially hydrogenated oils). These are often found in margarine and in some baked goods, fried foods, and snacks bought in packages.  Avoid precooked or cured meat, such as sausages or meat loaves.  Avoid foods and drinks that have added sugars.  Eat more fruits, vegetables, and whole grains.  Choose healthy sources of protein, such as fish, poultry, lean cuts of red meat, beans, peas, lentils, and nuts.  Choose healthy sources of fat, such as: ? Nuts. ? Vegetable oils, especially olive oil. ? Fish that have healthy fats (omega-3 fatty acids), such as mackerel or salmon. The items listed above may not be a complete list of recommended foods and beverages. Contact a dietitian for more information. Lifestyle  Lose weight if you are overweight. Losing 5-10 lb (2.3-4.5 kg) can help prevent or control high cholesterol. It can also lower your risk for diabetes and high blood pressure. Ask your health care provider to help you with a diet and exercise plan to lose weight safely.  Do not use any products that contain nicotine or tobacco, such as cigarettes, e-cigarettes, and chewing tobacco. If you need help quitting, ask your health care provider.  Limit your alcohol intake. ? Do not drink alcohol if:  Your health care provider tells you not to drink.  You are pregnant, may be pregnant, or are planning to become pregnant. ? If you drink alcohol:  Limit how much you use to:  0-1  drink a day for women.  0-2 drinks a day for men.  Be aware of how much alcohol is in your drink. In the [place] one drink equals one 12 oz bottle of beer (355 mL), one 5 oz glass of wine (148 mL), or one 1 oz glass of hard liquor (44 mL). Activity   Get enough exercise. Each week, do at least 150 minutes of exercise that takes a medium level of effort  (moderate-intensity exercise). ? This is exercise that:  Makes your heart beat faster and makes you breathe harder than usual.  Allows you to still be able to talk. ? You could exercise in short sessions several times a day or longer sessions a few times a week. For example, on 5 days each week, you could walk fast or ride your bike 3 times a day for 10 minutes each time.  Do exercises as told by your health care provider. Medicines  In addition to diet and lifestyle changes, your health care provider may recommend medicines to help lower cholesterol. This may be a medicine to lower the amount of cholesterol your liver makes. You may need medicine if: ? Diet and lifestyle changes do not lower your cholesterol enough. ? You have high cholesterol and other risk factors for heart disease or stroke.  Take over-the-counter and prescription medicines only as told by your health care provider. General information  Manage your risk factors for high cholesterol. Talk with your health care provider about all your risk factors and how to lower your risk.  Manage other conditions that you have, such as diabetes or high blood pressure (hypertension).  Have blood tests to check your cholesterol levels at regular points in time as told by your health care provider.  Keep all follow-up visits as told by your health care provider. This is important. Where to find more information  American Heart Association: www.heart.org  National Heart, Lung, and Blood Institute: https://[place].com/ Summary  High cholesterol increases your risk for heart disease and stroke. By keeping your cholesterol level low, you can reduce your risk for these conditions.  High cholesterol can often be prevented with diet and lifestyle changes.  Work with your health care provider to manage your risk factors, and have your blood tested regularly. This information is not intended to replace advice given to you by your health  care provider. Make sure you discuss any questions you have with your health care provider. Document Released: 03/21/2015 Document Revised: 06/28/2018 Document Reviewed: 11/13/2015 Elsevier Patient Education  [place].   Please be sure medication list is accurate. If a new problem present, please set up appointment sooner than planned today.

## 2019-01-31 NOTE — Progress Notes (Signed)
HPI:   Teresa Lee is a 58 y.o. female, who is here today for 5 months follow up.   She was last seen on 08/30/18. Since her last OV she has followed with her neurologists, myasthenia gravis is getting worse. Medications were adjusted but she does not feel significant improvement.  During prior visit she has been c/o nausea. She states that found out it was one of her medications, has been discontinued.  HTN: She is on Triamterene-HCTZ 37.5-25 mg daily and Losartan 50 mg daily. HypoK+ on KLOR 20 meq daily. Denies severe/frequent headache, visual changes, chest pain, dyspnea, palpitation, focal weakness, or edema.  Component     Latest Ref Rng & Units 08/30/2018  Sodium     135 - 145 mEq/L 140  Potassium     3.5 - 5.1 mEq/L 3.5  Chloride     96 - 112 mEq/L 101  CO2     19 - 32 mEq/L 22  Glucose     70 - 99 mg/dL 112 (H)  BUN     6 - 23 mg/dL 19  Creatinine     0.40 - 1.20 mg/dL 0.82  Calcium     8.4 - 10.5 mg/dL 9.3  GFR     >60.00 mL/min 71.97     HLD: She is not following low fat diet. She is on Crestor 40 mg daily. Tolerating medication well.  Lab Results  Component Value Date   CHOL 189 08/30/2018   HDL 34.20 (L) 08/30/2018   LDLCALC 162 (H) 08/08/2013   LDLDIRECT 47.0 08/30/2018   TRIG (H) 08/30/2018    662.0 Triglyceride is over 400; calculations on Lipids are invalid.   CHOLHDL 6 08/30/2018   B12 deficiency: She is on B12 1000 mcg IM every 2-3 months.  Lab Results  Component Value Date   VITAMINB12 >1500 (H) 08/30/2018   Vit D deficiency: She is on Ergocalciferol 50,000 U q 2 weeks. Last 25 OH vit D was 33.4 in 08/30/18.  Asthma: She is using Albuterol inh "several times" per week for SOB. "It depends" of type of activity", usually when when walking.  No associated wheezing,cough,CP,or diaphoresis. She is on Symbicort 80-4.5 mcg bid. She thinks it may be related to GERD. No heartburn but states that her pulmonologist thought  symptom was caused by GERD.  Anxiety: She is on Citalopram 20 mg daily. States that her gyn added Xanax 0.5 mg to take tid prn. Denies suicidal thoughts  Review of Systems  Constitutional: Positive for fatigue. Negative for activity change, appetite change and fever.  HENT: Negative for mouth sores, nosebleeds and sore throat.   Eyes: Negative for redness and visual disturbance.  Gastrointestinal: Negative for abdominal pain and vomiting.       Negative for changes in bowel habits.  Genitourinary: Negative for decreased urine volume and hematuria.  Musculoskeletal: Positive for arthralgias and myalgias. Negative for gait problem.  Allergic/Immunologic: Positive for environmental allergies.  Neurological: Negative for seizures, syncope and weakness.  Psychiatric/Behavioral: Positive for sleep disturbance. Negative for confusion. The patient is nervous/anxious (Her gyn started her on Xanax.).   Rest of ROS, see pertinent positives sand negatives in HPI   Current Outpatient Medications on File Prior to Visit  Medication Sig Dispense Refill  . albuterol (PROVENTIL HFA) 108 (90 Base) MCG/ACT inhaler Inhale 1 puff into the lungs every 6 (six) hours as needed for wheezing or shortness of breath. 18 g 2  . albuterol (PROVENTIL) (2.5  MG/3ML) 0.083% nebulizer solution Take 2.5 mg by nebulization every 6 (six) hours as needed for Wheezing.    . APRISO 0.375 g 24 hr capsule Take by mouth daily.     . budesonide-formoterol (SYMBICORT) 80-4.5 MCG/ACT inhaler Inhale 2 puffs into the lungs 2 (two) times daily. 1 Inhaler 3  . Cholecalciferol (VITAMIN D-3) 5000 UNITS TABS Take 1 tablet by mouth daily.    Marland Kitchen desmopressin (DDAVP) 0.1 MG tablet Take 0.3 mg by mouth daily.     Marland Kitchen estradiol (ESTRACE) 2 MG tablet Take 2 mg by mouth 2 (two) times daily.     Marland Kitchen HYDROcodone-acetaminophen (NORCO) 10-325 MG tablet Take 2 tablets by mouth every 6 (six) hours as needed.     Marland Kitchen losartan (COZAAR) 50 MG tablet TAKE 1 TABLET  DAILY 90 tablet 0  . meloxicam (MOBIC) 15 MG tablet TAKE 1 TABLET DAILY 90 tablet 0  . mycophenolate (CELLCEPT) 500 MG tablet Take 500 mg by mouth 2 (two) times daily.     . pantoprazole (PROTONIX) 40 MG tablet TAKE 1 TABLET DAILY 90 tablet 0  . potassium chloride SA (KLOR-CON) 20 MEQ tablet Take 1/2 TO 1 TABLET DAILY 90 tablet 0  . predniSONE (DELTASONE) 10 MG tablet Take 7.5 mg by mouth daily.     . progesterone (PROMETRIUM) 100 MG capsule Take 100 mg by mouth at bedtime.    . pyridostigmine (MESTINON) 60 MG tablet 3 (three) times daily. Take 1/2 tablet 3 times per day    . rOPINIRole (REQUIP) 1 MG tablet TAKE 1 OR 2 TABLETS AT BEDTIME 180 tablet 0  . rosuvastatin (CRESTOR) 40 MG tablet Take 1 tablet (40 mg total) by mouth daily. 90 tablet 1  . Spacer/Aero-Holding Chambers (AEROCHAMBER PLUS) inhaler Use as instructed to use with inahaler. 1 each 1  . triamterene-hydrochlorothiazide (DYAZIDE) 37.5-25 MG capsule TAKE (1) CAPSULE DAILY IN THE MORNING. 90 capsule 0  . Vitamin D, Ergocalciferol, (DRISDOL) 1.25 MG (50000 UT) CAPS capsule TAKE 1 CAPSULE EVERY 2 WEEKS 7 capsule 1   No current facility-administered medications on file prior to visit.      Past Medical History:  Diagnosis Date  . Arthritis    osteoarthritis  . Asthma   . Blind left eye   . Cancer ([place])    skin  . Crohn's disease ([place])   . CTS (carpal tunnel syndrome)   . DJD (degenerative joint disease)    Both cervical spine and LS spine  . Fibromyalgia   . GERD (gastroesophageal reflux disease)   . Heart murmur   . Herniated nucleus pulposus   . Hyperlipidemia   . Hypertension   . IBS (irritable bowel syndrome)   . Leukocytosis   . Myasthenia gravis   . Myasthenia gravis ([place])   . Spinal cord stimulator status    Allergies  Allergen Reactions  . Latex Other (See Comments)    BLISTER  . Lorazepam     Other reaction(s): Hallucinations  . Percocet [Oxycodone-Acetaminophen] Hives  . Sulfa Antibiotics Hives  .  Sulfamethoxazole-Trimethoprim Hives  . Tapentadol Hcl Other (See Comments)    HEADACHES NUCENTA  . Nucynta [Tapentadol]     Headache   . Orange Concentrate [Flavoring Agent]     Acid reflux  . Other Other (See Comments), Hives and Nausea Only    Some nuts cause a rash  . Tomato     Burns skin  . Bioflavonoids Rash    Causes burning of skin and blisters  .  Cefixime     Due to myasthenia gravis    Social History   Socioeconomic History  . Marital status: Married    Spouse name: Not on file  . Number of children: 1  . Years of education: Not on file  . Highest education level: Not on file  Occupational History  . Occupation: disabled  Social Needs  . Financial resource strain: Not on file  . Food insecurity    Worry: Not on file    Inability: Not on file  . Transportation needs    Medical: Not on file    Non-medical: Not on file  Tobacco Use  . Smoking status: Never Smoker  . Smokeless tobacco: Never Used  Substance and Sexual Activity  . Alcohol use: Yes    Comment: occassional wine  . Drug use: No  . Sexual activity: Not on file  Lifestyle  . Physical activity    Days per week: Not on file    Minutes per session: Not on file  . Stress: Not on file  Relationships  . Social Herbalist on phone: Not on file    Gets together: Not on file    Attends religious service: Not on file    Active member of club or organization: Not on file    Attends meetings of clubs or organizations: Not on file    Relationship status: Not on file  Other Topics Concern  . Not on file  Social History Narrative  . Not on file    Vitals:   01/31/19 1010  BP: 140/70  Pulse: 97  Resp: 16  Temp: (!) 97.5 F (36.4 C)  SpO2: 99%   Body mass index is 28.19 kg/m.   Physical Exam  Nursing note and vitals reviewed. Constitutional: She is oriented to person, place, and time. She appears well-developed and well-nourished. No distress.  HENT:  Head: Normocephalic and  atraumatic.  Mouth/Throat: Oropharynx is clear and moist and mucous membranes are normal.  Eyes: Pupils are equal, round, and reactive to light. Conjunctivae are normal.  Cardiovascular: Normal rate and regular rhythm.  No murmur heard. Pulses:      Dorsalis pedis pulses are 2+ on the right side and 2+ on the left side.  Respiratory: Effort normal and breath sounds normal. No respiratory distress.  GI: Soft. She exhibits no mass. There is no hepatomegaly. There is no abdominal tenderness.  Musculoskeletal:        General: No edema.  Lymphadenopathy:    She has no cervical adenopathy.  Neurological: She is alert and oriented to person, place, and time. She has normal strength. No cranial nerve deficit. Gait normal.  Skin: Skin is warm. No rash noted. No erythema.  Psychiatric: Her mood appears anxious.  Well groomed, good eye contact.    ASSESSMENT AND PLAN:   Ms. Kierstan Auer was seen today for 5 months follow-up.  Orders Placed This Encounter  Procedures  . Flu Vaccine QUAD 36+ mos IM  . CMP  . Lipid panel  . VITAMIN D 25 Hydroxy (Vit-D Deficiency, Fractures)  . Vitamin B12  . LDL cholesterol, direct   Lab Results  Component Value Date   CHOL 163 01/31/2019   HDL 37.30 (L) 01/31/2019   LDLCALC 162 (H) 08/08/2013   LDLDIRECT 55.0 01/31/2019   TRIG (H) 01/31/2019    456.0 Triglyceride is over 400; calculations on Lipids are invalid.   CHOLHDL 4 01/31/2019    Lab Results  Component  Value Date   VITAMINB12 >1500 (H) 01/31/2019   Lab Results  Component Value Date   CREATININE 0.97 01/31/2019   BUN 25 (H) 01/31/2019   NA 140 01/31/2019   K 3.4 (L) 01/31/2019   CL 98 01/31/2019   CO2 29 01/31/2019   Lab Results  Component Value Date   ALT 8 01/31/2019   AST 9 01/31/2019   ALKPHOS 45 01/31/2019   BILITOT 0.4 01/31/2019     Essential hypertension SBP slightly elevated. Instructed to monitor BP at home. No changes in current management. Low salt diet  recommended.  -     CMP  Anxiety disorder, unspecified type Stable. Recently Xanax 0.5 mg to take tid as needed was added,she is taking it daily. No changes in Citalopram.  Vitamin D deficiency, unspecified No changes in current management, will follow labs done today and will give further recommendations accordingly.  B12 deficiency Here in the office B12 1000 mcg Im x 1 given. Further recommendations will be given according to B12 results.  -     cyanocobalamin ((VITAMIN B-12)) injection 1,000 mcg  Mixed hyperlipidemia Low fat diet recommended. Continue Crestor 40 mg daily. Further recommendations according to results.  SOB (shortness of breath) on exertion No other associated symptom that suggest asthma exacerbation. ? Deconditioning,GERD.  Instructed about warning signs.  Gastroesophageal reflux disease, unspecified whether esophagitis present GERD precautions recommended. Continue Pantoprazole 40 mg daily.  Need for immunization against influenza -     Flu Vaccine QUAD 36+ mos IM  Mild intermittent asthma, unspecified whether complicated No changes in current management.  Other orders -     LDL cholesterol, direct     G. Martinique, MD  [facility]. [place] office.   Return in about 5 months (around 07/01/2019).   -Ms. Nacole Fluhr was advised to return sooner than planned today if new concerns arise.     G. Martinique, MD  [facility]. [place] office.

## 2019-02-02 ENCOUNTER — Encounter: Payer: Self-pay | Admitting: Family Medicine

## 2019-02-02 MED ORDER — OMEGA-3-ACID ETHYL ESTERS 1 G PO CAPS: 1.0000 g | ORAL_CAPSULE | Freq: Two times a day (BID) | ORAL | 3 refills | Status: DC

## 2019-02-02 MED ORDER — ROSUVASTATIN CALCIUM 40 MG PO TABS: 40.0000 mg | ORAL_TABLET | Freq: Every day | ORAL | 2 refills | Status: DC

## 2019-02-05 DIAGNOSIS — G7000 Myasthenia gravis without (acute) exacerbation: Secondary | ICD-10-CM | POA: Diagnosis not present

## 2019-02-05 DIAGNOSIS — M79651 Pain in right thigh: Secondary | ICD-10-CM | POA: Diagnosis not present

## 2019-02-05 DIAGNOSIS — M5136 Other intervertebral disc degeneration, lumbar region: Secondary | ICD-10-CM | POA: Diagnosis not present

## 2019-02-05 DIAGNOSIS — G894 Chronic pain syndrome: Secondary | ICD-10-CM | POA: Diagnosis not present

## 2019-02-11 ENCOUNTER — Other Ambulatory Visit: Payer: Self-pay | Admitting: *Deleted

## 2019-02-11 DIAGNOSIS — Z20822 Contact with and (suspected) exposure to covid-19: Secondary | ICD-10-CM

## 2019-02-12 ENCOUNTER — Encounter: Payer: Self-pay | Admitting: Family Medicine

## 2019-02-12 ENCOUNTER — Other Ambulatory Visit: Payer: Self-pay

## 2019-02-12 ENCOUNTER — Telehealth (INDEPENDENT_AMBULATORY_CARE_PROVIDER_SITE_OTHER): Payer: Medicare HMO | Admitting: Family Medicine

## 2019-02-12 VITALS — Temp 98.4°F

## 2019-02-12 DIAGNOSIS — U071 COVID-19: Secondary | ICD-10-CM | POA: Diagnosis not present

## 2019-02-12 LAB — NOVEL CORONAVIRUS, NAA: SARS-CoV-2, NAA: DETECTED — AB

## 2019-02-12 NOTE — Progress Notes (Signed)
Virtual Visit via Telephone Note  I connected with Teresa Lee on 02/12/19 at  3:15 PM EST by telephone and verified that I am speaking with the correct person using two identifiers.   I discussed the limitations, risks, security and privacy concerns of performing an evaluation and management service by telephone and the availability of in person appointments. I also discussed with the patient that there may be a patient responsible charge related to this service. The patient expressed understanding and agreed to proceed.  Location patient: home Location provider: work office Participants present for the call: patient, provider Patient did not have a visit in the prior 7 days to address this/these issue(s).   History of Present Illness:   Teresa Lee is a 58 year old female with history of hypertension, myasthenia gravis, depression, asthma, vitamin D deficiency, fibromyalgia who was recently screened for COVID-19 and today she learned about test being positive.She wants to know if she can take abx.  Her husband is also sick and also positive for COVID-19. She is not sure about source of exposure.  A couple days ago she started with chills, fatigue,postnasal drainage, non productive cough ("not a lot") and "sometimes" shortness of breath. Changes in smell and taste.  Negative for unusual headache, fever, sore throat, chest pain, wheezing, abdominal pain, diarrhea, vomiting,or skin rash. Mild nausea 2 days ago, improved.  She has not taken OTC medications.  Observations/Objective: Patient sounds cheerful and well on the phone. I do not appreciate any SOB,cough,or stridor. Speech and thought processing are grossly intact. Patient reported vitals:Temp 98.4 F (36.9 C)    Assessment and Plan:  1. COVID-19 virus infection Educated about diagnosis, prognosis, and treatment. Explained that antibiotic treatment is not recommended to treat COVID-19 infection. Recommend rest,  adequate hydration, and to continue monitoring for fever. Educated about possible complications of COVID 16. Quarantine x 14 days.  She was clearly instructed about warning signs. She voices understanding and agrees with plan.  Follow Up Instructions:  Return if symptoms worsen or fail to improve.  I did not refer this patient for an OV in the next 24 hours for this/these issue(s).  I discussed the assessment and treatment plan with the patient. She was provided an opportunity to ask questions and all were answered. She agreed with the plan and demonstrated an understanding of the instructions.   I provided 11 minutes of non-face-to-face time during this encounter.    [place], MD

## 2019-02-16 ENCOUNTER — Inpatient Hospital Stay (HOSPITAL_BASED_OUTPATIENT_CLINIC_OR_DEPARTMENT_OTHER)
Admission: EM | Admit: 2019-02-16 | Discharge: 2019-02-25 | DRG: 177 | Disposition: A | Discharge status: Home / Self Care | Payer: Medicare HMO | Attending: Family Medicine | Admitting: Family Medicine

## 2019-02-16 ENCOUNTER — Encounter (HOSPITAL_BASED_OUTPATIENT_CLINIC_OR_DEPARTMENT_OTHER): Payer: Self-pay | Admitting: Emergency Medicine

## 2019-02-16 ENCOUNTER — Other Ambulatory Visit: Payer: Self-pay

## 2019-02-16 ENCOUNTER — Emergency Department (HOSPITAL_BASED_OUTPATIENT_CLINIC_OR_DEPARTMENT_OTHER): Payer: Medicare HMO

## 2019-02-16 DIAGNOSIS — Z9682 Presence of neurostimulator: Secondary | ICD-10-CM

## 2019-02-16 DIAGNOSIS — Z8349 Family history of other endocrine, nutritional and metabolic diseases: Secondary | ICD-10-CM

## 2019-02-16 DIAGNOSIS — R739 Hyperglycemia, unspecified: Secondary | ICD-10-CM | POA: Diagnosis not present

## 2019-02-16 DIAGNOSIS — Z881 Allergy status to other antibiotic agents status: Secondary | ICD-10-CM

## 2019-02-16 DIAGNOSIS — U071 COVID-19: Principal | ICD-10-CM

## 2019-02-16 DIAGNOSIS — R7303 Prediabetes: Secondary | ICD-10-CM | POA: Diagnosis not present

## 2019-02-16 DIAGNOSIS — Z882 Allergy status to sulfonamides status: Secondary | ICD-10-CM

## 2019-02-16 DIAGNOSIS — E872 Acidosis: Secondary | ICD-10-CM | POA: Diagnosis not present

## 2019-02-16 DIAGNOSIS — G7000 Myasthenia gravis without (acute) exacerbation: Secondary | ICD-10-CM | POA: Diagnosis present

## 2019-02-16 DIAGNOSIS — E785 Hyperlipidemia, unspecified: Secondary | ICD-10-CM | POA: Diagnosis present

## 2019-02-16 DIAGNOSIS — Z8249 Family history of ischemic heart disease and other diseases of the circulatory system: Secondary | ICD-10-CM

## 2019-02-16 DIAGNOSIS — T380X5A Adverse effect of glucocorticoids and synthetic analogues, initial encounter: Secondary | ICD-10-CM | POA: Diagnosis not present

## 2019-02-16 DIAGNOSIS — R0602 Shortness of breath: Secondary | ICD-10-CM | POA: Diagnosis not present

## 2019-02-16 DIAGNOSIS — I351 Nonrheumatic aortic (valve) insufficiency: Secondary | ICD-10-CM | POA: Diagnosis not present

## 2019-02-16 DIAGNOSIS — R351 Nocturia: Secondary | ICD-10-CM | POA: Diagnosis present

## 2019-02-16 DIAGNOSIS — E860 Dehydration: Secondary | ICD-10-CM | POA: Diagnosis present

## 2019-02-16 DIAGNOSIS — B964 Proteus (mirabilis) (morganii) as the cause of diseases classified elsewhere: Secondary | ICD-10-CM | POA: Diagnosis not present

## 2019-02-16 DIAGNOSIS — G4700 Insomnia, unspecified: Secondary | ICD-10-CM | POA: Diagnosis present

## 2019-02-16 DIAGNOSIS — H5462 Unqualified visual loss, left eye, normal vision right eye: Secondary | ICD-10-CM | POA: Diagnosis present

## 2019-02-16 DIAGNOSIS — F419 Anxiety disorder, unspecified: Secondary | ICD-10-CM | POA: Diagnosis present

## 2019-02-16 DIAGNOSIS — Z91040 Latex allergy status: Secondary | ICD-10-CM

## 2019-02-16 DIAGNOSIS — Z79899 Other long term (current) drug therapy: Secondary | ICD-10-CM

## 2019-02-16 DIAGNOSIS — R9431 Abnormal electrocardiogram [ECG] [EKG]: Secondary | ICD-10-CM | POA: Diagnosis not present

## 2019-02-16 DIAGNOSIS — R509 Fever, unspecified: Secondary | ICD-10-CM | POA: Diagnosis not present

## 2019-02-16 DIAGNOSIS — Z833 Family history of diabetes mellitus: Secondary | ICD-10-CM

## 2019-02-16 DIAGNOSIS — Z7952 Long term (current) use of systemic steroids: Secondary | ICD-10-CM

## 2019-02-16 DIAGNOSIS — M47812 Spondylosis without myelopathy or radiculopathy, cervical region: Secondary | ICD-10-CM | POA: Diagnosis present

## 2019-02-16 DIAGNOSIS — J1289 Other viral pneumonia: Secondary | ICD-10-CM | POA: Diagnosis not present

## 2019-02-16 DIAGNOSIS — G2581 Restless legs syndrome: Secondary | ICD-10-CM | POA: Diagnosis present

## 2019-02-16 DIAGNOSIS — K5090 Crohn's disease, unspecified, without complications: Secondary | ICD-10-CM | POA: Diagnosis present

## 2019-02-16 DIAGNOSIS — M797 Fibromyalgia: Secondary | ICD-10-CM | POA: Diagnosis present

## 2019-02-16 DIAGNOSIS — K219 Gastro-esophageal reflux disease without esophagitis: Secondary | ICD-10-CM | POA: Diagnosis present

## 2019-02-16 DIAGNOSIS — K50919 Crohn's disease, unspecified, with unspecified complications: Secondary | ICD-10-CM | POA: Diagnosis not present

## 2019-02-16 DIAGNOSIS — R531 Weakness: Secondary | ICD-10-CM | POA: Diagnosis not present

## 2019-02-16 DIAGNOSIS — Y92230 Patient room in hospital as the place of occurrence of the external cause: Secondary | ICD-10-CM | POA: Diagnosis not present

## 2019-02-16 DIAGNOSIS — M47817 Spondylosis without myelopathy or radiculopathy, lumbosacral region: Secondary | ICD-10-CM | POA: Diagnosis present

## 2019-02-16 DIAGNOSIS — B9620 Unspecified Escherichia coli [E. coli] as the cause of diseases classified elsewhere: Secondary | ICD-10-CM | POA: Diagnosis present

## 2019-02-16 DIAGNOSIS — I340 Nonrheumatic mitral (valve) insufficiency: Secondary | ICD-10-CM | POA: Diagnosis not present

## 2019-02-16 DIAGNOSIS — N189 Chronic kidney disease, unspecified: Secondary | ICD-10-CM | POA: Diagnosis present

## 2019-02-16 DIAGNOSIS — Z9049 Acquired absence of other specified parts of digestive tract: Secondary | ICD-10-CM

## 2019-02-16 DIAGNOSIS — E876 Hypokalemia: Secondary | ICD-10-CM | POA: Diagnosis not present

## 2019-02-16 DIAGNOSIS — Z791 Long term (current) use of non-steroidal anti-inflammatories (NSAID): Secondary | ICD-10-CM

## 2019-02-16 DIAGNOSIS — Z825 Family history of asthma and other chronic lower respiratory diseases: Secondary | ICD-10-CM

## 2019-02-16 DIAGNOSIS — Z79890 Hormone replacement therapy (postmenopausal): Secondary | ICD-10-CM

## 2019-02-16 DIAGNOSIS — N390 Urinary tract infection, site not specified: Secondary | ICD-10-CM | POA: Diagnosis not present

## 2019-02-16 DIAGNOSIS — Z85820 Personal history of malignant melanoma of skin: Secondary | ICD-10-CM

## 2019-02-16 DIAGNOSIS — J45909 Unspecified asthma, uncomplicated: Secondary | ICD-10-CM | POA: Diagnosis present

## 2019-02-16 DIAGNOSIS — Z7951 Long term (current) use of inhaled steroids: Secondary | ICD-10-CM

## 2019-02-16 DIAGNOSIS — Z885 Allergy status to narcotic agent status: Secondary | ICD-10-CM

## 2019-02-16 DIAGNOSIS — N179 Acute kidney failure, unspecified: Secondary | ICD-10-CM | POA: Diagnosis present

## 2019-02-16 DIAGNOSIS — Z8619 Personal history of other infectious and parasitic diseases: Secondary | ICD-10-CM

## 2019-02-16 DIAGNOSIS — I10 Essential (primary) hypertension: Secondary | ICD-10-CM | POA: Diagnosis present

## 2019-02-16 DIAGNOSIS — R05 Cough: Secondary | ICD-10-CM | POA: Diagnosis not present

## 2019-02-16 DIAGNOSIS — Z888 Allergy status to other drugs, medicaments and biological substances status: Secondary | ICD-10-CM

## 2019-02-16 LAB — CBC WITH DIFFERENTIAL/PLATELET
Abs Immature Granulocytes: 1 10*3/uL — ABNORMAL HIGH (ref 0.00–0.07)
Band Neutrophils: 13 %
Basophils Absolute: 0 10*3/uL (ref 0.0–0.1)
Basophils Relative: 0 %
Eosinophils Absolute: 0 10*3/uL (ref 0.0–0.5)
Eosinophils Relative: 0 %
HCT: 39.3 % (ref 36.0–46.0)
Hemoglobin: 13.2 g/dL (ref 12.0–15.0)
Lymphocytes Relative: 10 %
Lymphs Abs: 1.7 10*3/uL (ref 0.7–4.0)
MCH: 31.7 pg (ref 26.0–34.0)
MCHC: 33.6 g/dL (ref 30.0–36.0)
MCV: 94.2 fL (ref 80.0–100.0)
Metamyelocytes Relative: 4 %
Monocytes Absolute: 1.2 10*3/uL — ABNORMAL HIGH (ref 0.1–1.0)
Monocytes Relative: 7 %
Myelocytes: 2 %
Neutro Abs: 13.4 10*3/uL — ABNORMAL HIGH (ref 1.7–7.7)
Neutrophils Relative %: 64 %
Platelets: 275 10*3/uL (ref 150–400)
RBC: 4.17 MIL/uL (ref 3.87–5.11)
RDW: 13.5 % (ref 11.5–15.5)
WBC: 17.4 10*3/uL — ABNORMAL HIGH (ref 4.0–10.5)
nRBC: 0 % (ref 0.0–0.2)

## 2019-02-16 LAB — COMPREHENSIVE METABOLIC PANEL
ALT: 9 U/L (ref 0–44)
AST: 18 U/L (ref 15–41)
Albumin: 3.9 g/dL (ref 3.5–5.0)
Alkaline Phosphatase: 67 U/L (ref 38–126)
Anion gap: 19 — ABNORMAL HIGH (ref 5–15)
BUN: 34 mg/dL — ABNORMAL HIGH (ref 6–20)
CO2: 21 mmol/L — ABNORMAL LOW (ref 22–32)
Calcium: 9.5 mg/dL (ref 8.9–10.3)
Chloride: 96 mmol/L — ABNORMAL LOW (ref 98–111)
Creatinine, Ser: 1.82 mg/dL — ABNORMAL HIGH (ref 0.44–1.00)
GFR calc Af Amer: 35 mL/min — ABNORMAL LOW (ref >60)
GFR calc non Af Amer: 30 mL/min — ABNORMAL LOW (ref >60)
Glucose, Bld: 144 mg/dL — ABNORMAL HIGH (ref 70–99)
Potassium: 3.4 mmol/L — ABNORMAL LOW (ref 3.5–5.1)
Sodium: 136 mmol/L (ref 135–145)
Total Bilirubin: 1.2 mg/dL (ref 0.3–1.2)
Total Protein: 8.5 g/dL — ABNORMAL HIGH (ref 6.5–8.1)

## 2019-02-16 LAB — LACTIC ACID, PLASMA: Lactic Acid, Venous: 1.7 mmol/L (ref 0.5–1.9)

## 2019-02-16 MED ORDER — SODIUM CHLORIDE 0.9 % IV BOLUS
500.0000 mL | Freq: Once | INTRAVENOUS | Status: AC
  Administered 2019-02-16: 500 mL via INTRAVENOUS

## 2019-02-16 MED ORDER — SODIUM CHLORIDE 0.9 % IV SOLN
Freq: Once | INTRAVENOUS | Status: AC
  Administered 2019-02-16 – 2019-02-17 (×2): via INTRAVENOUS

## 2019-02-16 MED ORDER — ONDANSETRON HCL 4 MG/2ML IJ SOLN
4.0000 mg | Freq: Once | INTRAMUSCULAR | Status: AC
  Administered 2019-02-16: 4 mg via INTRAVENOUS
  Filled 2019-02-16: qty 2

## 2019-02-16 MED ORDER — MORPHINE SULFATE (PF) 4 MG/ML IV SOLN
4.0000 mg | Freq: Once | INTRAVENOUS | Status: AC
  Administered 2019-02-16: 4 mg via INTRAVENOUS
  Filled 2019-02-16: qty 1

## 2019-02-16 MED ORDER — SODIUM CHLORIDE 0.9% FLUSH
3.0000 mL | Freq: Once | INTRAVENOUS | Status: DC
  Filled 2019-02-16: qty 3

## 2019-02-16 NOTE — ED Notes (Addendum)
Still with nausea. Denies vomiting. Will update PA. Pt's husband is aware that she will be admitted to the hospital and I will update him with a room number and location  when that information becomes available.

## 2019-02-16 NOTE — ED Notes (Signed)
Per given water per Armstead Peaks, PA

## 2019-02-16 NOTE — ED Triage Notes (Signed)
Patient states that she was tested positive for COVID +  - since she has continued to have fatigue and not get any better - she has also developed multiple other complaints including " blue breast"

## 2019-02-16 NOTE — ED Notes (Signed)
C/o legs hurting. Rates pain 6/10. Requesting something for nausea and pain. Sinus on monitor 95. B/p 98/62. 02 sats 97% on RA.

## 2019-02-16 NOTE — ED Provider Notes (Signed)
[facility] Provider Note   CSN: [id number] Arrival date & time: 02/16/19  1502     History   Chief Complaint Chief Complaint  Patient presents with   Fatigue    COVID +    HPI Teresa Lee is a 58 y.o. female with history of hypertension, GERD, myasthenia gravis, fibromyalgia, Crohn's disease, IBS who presents with a 2-week history of cough, nausea, vomiting, diarrhea, fatigue, body aches, decreased appetite.  Patient tested positive for Covid 5 days ago.  She has had progressively worsening fatigue.  She denies any significant shortness of breath, chest pain, abdominal pain, urinary symptoms.  She is chronic back pain at baseline and wears a spinal cord stimulator.     HPI  Past Medical History:  Diagnosis Date   Arthritis    osteoarthritis   Asthma    Blind left eye    Cancer ([place])    skin   Crohn's disease ([place])    CTS (carpal tunnel syndrome)    DJD (degenerative joint disease)    Both cervical spine and LS spine   Fibromyalgia    GERD (gastroesophageal reflux disease)    Heart murmur    Herniated nucleus pulposus    Hyperlipidemia    Hypertension    IBS (irritable bowel syndrome)    Leukocytosis    Myasthenia gravis    Myasthenia gravis ([place])    Spinal cord stimulator status     Patient Active Problem List   Diagnosis Date Noted   Acute on chronic renal failure ([place]) 02/16/2019   Insomnia disorder 02/27/2018   Fatigue 02/27/2018   Fibromyalgia syndrome 04/30/2017   RLS (restless legs syndrome) 12/26/2016   B12 deficiency 12/26/2016   Vitamin D deficiency, unspecified 12/26/2016   GERD (gastroesophageal reflux disease) 12/26/2016   Anxiety disorder 12/26/2016   Hyponatremia 10/24/2016   AKI (acute kidney injury) ([place]) 10/23/2016   Hypomagnesemia 10/22/2016   Hypophosphatemia 10/22/2016   Hypokalemia 10/21/2016   Wound infection after surgery 10/21/2016   Sepsis ([place])  10/21/2016   Myasthenia gravis ([place]) 10/21/2016   Melanoma ([place]) 10/21/2016   Crohn disease ([place]) 10/21/2016   Cellulitis 10/21/2016   Palpitations 08/25/2013   Leukocytosis 05/25/2011   Hyperlipidemia 04/20/2007   Essential hypertension 04/20/2007   Asthma 04/20/2007   CARPAL TUNNEL SYNDROME, HX OF 04/20/2007    Past Surgical History:  Procedure Laterality Date   BREAST REDUCTION SURGERY     CARPAL TUNNEL RELEASE     bilateral   cervical disc inj     CHOLECYSTECTOMY     ENDOMETRIAL ABLATION W/ NOVASURE     MELANOMA EXCISION     NASAL SINUS SURGERY     THYMECTOMY     due to myastenia gravis   THYMECTOMY       OB History   No obstetric history on file.      Home Medications    Prior to Admission medications   Medication Sig Start Date End Date Taking? Authorizing Provider  albuterol (PROVENTIL HFA) 108 (90 Base) MCG/ACT inhaler Inhale 1 puff into the lungs every 6 (six) hours as needed for wheezing or shortness of breath. 07/02/18   Martinique, Betty G, MD  albuterol (PROVENTIL) (2.5 MG/3ML) 0.083% nebulizer solution Take 2.5 mg by nebulization every 6 (six) hours as needed for Wheezing.    [provider]  APRISO 0.375 g 24 hr capsule Take by mouth daily.  01/24/18   [provider]  budesonide-formoterol (SYMBICORT) 80-4.5 MCG/ACT inhaler  Inhale 2 puffs into the lungs 2 (two) times daily. 08/30/18   Martinique, Betty G, MD  desmopressin (DDAVP) 0.1 MG tablet Take 0.3 mg by mouth daily.     [provider]  estradiol (ESTRACE) 2 MG tablet Take 2 mg by mouth 2 (two) times daily.     [provider]  HYDROcodone-acetaminophen (NORCO) 10-325 MG tablet Take 2 tablets by mouth every 6 (six) hours as needed.  08/25/17   [provider]  losartan (COZAAR) 50 MG tablet TAKE 1 TABLET DAILY 01/22/19   Martinique, Betty G, MD  meloxicam (MOBIC) 15 MG tablet TAKE 1 TABLET DAILY 11/22/18   Martin[place], Betty G, MD  mycophenolate (CELLCEPT) 500  MG tablet Take 500 mg by mouth 2 (two) times daily.     [provider]  omega-3 acid ethyl esters (LOVAZA) 1 g capsule Take 1 capsule (1 g total) by mouth 2 (two) times daily. 02/02/19   Martinique, Betty G, MD  pantoprazole (PROTONIX) 40 MG tablet TAKE 1 TABLET DAILY 12/27/18   Martin[place], Betty G, MD  potassium chloride SA (KLOR-CON) 20 MEQ tablet Take 1/2 TO 1 TABLET DAILY 12/27/18   Martin[place], Betty G, MD  predniSONE (DELTASONE) 10 MG tablet Take 7.5 mg by mouth daily.     [provider]  progesterone (PROMETRIUM) 100 MG capsule Take 100 mg by mouth at bedtime.    [provider]  pyridostigmine (MESTINON) 60 MG tablet 3 (three) times daily. Take 1/2 tablet 3 times per day 12/27/17   [provider]  rOPINIRole (REQUIP) 1 MG tablet TAKE 1 OR 2 TABLETS AT BEDTIME 01/27/19   Martinique, Betty G, MD  rosuvastatin (CRESTOR) 40 MG tablet Take 1 tablet (40 mg total) by mouth daily. 02/02/19   Martinique, Betty G, MD  Spacer/Aero-Holding Chambers (AEROCHAMBER PLUS) inhaler Use as instructed to use with inahaler. 08/30/18   Martinique, Betty G, MD  triamterene-hydrochlorothiazide (DYAZIDE) 37.5-25 MG capsule TAKE (1) CAPSULE DAILY IN THE MORNING. 12/20/18   Martinique, Betty G, MD  Vitamin D, Ergocalciferol, (DRISDOL) 1.25 MG (50000 UT) CAPS capsule TAKE 1 CAPSULE EVERY 2 WEEKS 08/27/18   Martinique, Betty G, MD    Family History Family History  Problem Relation Age of Onset   Asthma Daughter    Heart disease Mother    Cancer Mother    Hyperlipidemia Mother    Hypertension Mother    Diabetes Father     Social History Social History   Tobacco Use   Smoking status: Never Smoker   Smokeless tobacco: Never Used  Substance Use Topics   Alcohol use: Yes    Comment: occassional wine   Drug use: No     Allergies   Latex, Lorazepam, Percocet [oxycodone-acetaminophen], Sulfa antibiotics, Sulfamethoxazole-trimethoprim, Tapentadol hcl, Nucynta [tapentadol], Orange concentrate  [flavoring agent], Other, Tomato, Bioflavonoids, and Cefixime   Review of Systems Review of Systems  Constitutional: Positive for activity change, appetite change, fatigue and fever. Negative for chills.  HENT: Negative for facial swelling and sore throat.   Respiratory: Positive for cough. Negative for shortness of breath.   Cardiovascular: Negative for chest pain.  Gastrointestinal: Positive for abdominal pain, diarrhea, nausea and vomiting.  Genitourinary: Negative for dysuria.  Musculoskeletal: Positive for back pain (baseline).  Skin: Negative for rash and wound.  Neurological: Negative for headaches.  Psychiatric/Behavioral: The patient is not nervous/anxious.      Physical Exam Updated Vital Signs BP 117/70 (BP Location: Left Arm)    Pulse 85  Temp 98.4 F (36.9 C) (Oral)    Resp 19    Ht 5' 1"  (1.549 m)    Wt 67.7 kg    SpO2 98%    BMI 28.20 kg/m   Physical Exam Vitals signs and nursing note reviewed.  Constitutional:      General: She is not in acute distress.    Appearance: She is well-developed. She is ill-appearing. She is not toxic-appearing or diaphoretic.  HENT:     Head: Normocephalic and atraumatic.     Mouth/Throat:     Pharynx: No oropharyngeal exudate.  Eyes:     General: No scleral icterus.       Right eye: No discharge.        Left eye: No discharge.     Conjunctiva/sclera: Conjunctivae normal.     Pupils: Pupils are equal, round, and reactive to light.  Neck:     Musculoskeletal: Normal range of motion and neck supple.     Thyroid: No thyromegaly.  Cardiovascular:     Rate and Rhythm: Normal rate and regular rhythm.     Heart sounds: Normal heart sounds. No murmur. No friction rub. No gallop.   Pulmonary:     Effort: Pulmonary effort is normal. No respiratory distress.     Breath sounds: Normal breath sounds. No stridor. No wheezing or rales.  Abdominal:     General: Bowel sounds are normal. There is no distension.     Palpations: Abdomen is  soft.     Tenderness: There is no abdominal tenderness. There is no guarding or rebound.  Musculoskeletal:     Comments: Tenderness over the low back  Lymphadenopathy:     Cervical: No cervical adenopathy.  Skin:    General: Skin is warm and dry.     Coloration: Skin is not pale.     Findings: No rash.  Neurological:     Mental Status: She is alert.     Coordination: Coordination normal.      ED Treatments / Results  Labs (all labs ordered are listed, but only abnormal results are displayed) Labs Reviewed  CBC WITH DIFFERENTIAL/PLATELET - Abnormal; Notable for the following components:      Result Value   WBC 17.4 (*)    Neutro Abs 13.4 (*)    Monocytes Absolute 1.2 (*)    Abs Immature Granulocytes 1.00 (*)    All other components within normal limits  COMPREHENSIVE METABOLIC PANEL - Abnormal; Notable for the following components:   Potassium 3.4 (*)    Chloride 96 (*)    CO2 21 (*)    Glucose, Bld 144 (*)    BUN 34 (*)    Creatinine, Ser 1.82 (*)    Total Protein 8.5 (*)    GFR calc non Af Amer 30 (*)    GFR calc Af Amer 35 (*)    Anion gap 19 (*)    All other components within normal limits  CULTURE, BLOOD (ROUTINE X 2)  CULTURE, BLOOD (ROUTINE X 2)  LACTIC ACID, PLASMA  URINALYSIS, ROUTINE W REFLEX MICROSCOPIC    EKG None  Radiology Dg Chest Portable 1 View  Result Date: 02/16/2019 CLINICAL DATA:  Cough, congestion.  Fever. EXAM: PORTABLE CHEST 1 VIEW COMPARISON:  Chest radiograph 09/05/2017 FINDINGS: Normal cardiac and mediastinal contours status post median sternotomy. No consolidative pulmonary opacities. No pleural effusion or pneumothorax. Thoracic spinal stimulator device. IMPRESSION: No acute cardiopulmonary process. Electronically Signed   By: Polly Cobia.D.  On: 02/16/2019 17:47    Procedures Procedures (including critical care time)  Medications Ordered in ED Medications  sodium chloride 0.9 % bolus 500 mL (0 mLs Intravenous Stopped  02/16/19 1919)  morphine 4 MG/ML injection 4 mg (4 mg Intravenous Given 02/16/19 1926)  sodium chloride 0.9 % bolus 500 mL (0 mLs Intravenous Stopped 02/16/19 2041)  ondansetron (ZOFRAN) injection 4 mg (4 mg Intravenous Given 02/16/19 1926)  ondansetron (ZOFRAN) injection 4 mg (4 mg Intravenous Given 02/16/19 2045)  0.9 %  sodium chloride infusion ( Intravenous New Bag/Given 02/16/19 2204)     Initial Impression / Assessment and Plan / ED Course  I have reviewed the triage vital signs and the nursing notes.  Pertinent labs & imaging results that were available during my care of the patient were reviewed by me and considered in my medical decision making (see chart for details).        Patient presenting with known Covid 19+.  Tachycardic on arrival improved with fluids.  Leukocytosis at 17,000.  Patient with AKI at BUN 34, creatinine 1.82, anion gap of 19.  Lactic acid is negative.  Blood cultures pending.  Chest x-ray is clear.  UA pending.  IV fluids given.  Considering patient's AKI and status, I discussed patient case with Dr. Vanita Ingles with [facility] at [facility] accepts patient for admission.  I appreciate his assistance with the patient.  Final Clinical Impressions(s) / ED Diagnoses   Final diagnoses:  COVID-19  AKI (acute kidney injury) [facility])    ED Discharge Orders    None       Frederica Kuster, PA-C 02/16/19 2316    Charlesetta Shanks, MD 03/03/19 [phone]

## 2019-02-16 NOTE — ED Notes (Signed)
ED Provider at bedside. 

## 2019-02-16 NOTE — ED Notes (Signed)
Educated patient on how to use room phone so she could speak with her husband. Pt is on the phone with her husband at this time. Also made pt aware that we do need a urine sample. Pt unable to void at this time.

## 2019-02-17 DIAGNOSIS — G7000 Myasthenia gravis without (acute) exacerbation: Secondary | ICD-10-CM

## 2019-02-17 LAB — HIV ANTIBODY (ROUTINE TESTING W REFLEX): HIV Screen 4th Generation wRfx: NONREACTIVE

## 2019-02-17 LAB — COMPREHENSIVE METABOLIC PANEL
ALT: 9 U/L (ref 0–44)
AST: 17 U/L (ref 15–41)
Albumin: 3 g/dL — ABNORMAL LOW (ref 3.5–5.0)
Alkaline Phosphatase: 50 U/L (ref 38–126)
Anion gap: 13 (ref 5–15)
BUN: 36 mg/dL — ABNORMAL HIGH (ref 6–20)
CO2: 21 mmol/L — ABNORMAL LOW (ref 22–32)
Calcium: 7.8 mg/dL — ABNORMAL LOW (ref 8.9–10.3)
Chloride: 103 mmol/L (ref 98–111)
Creatinine, Ser: 1.73 mg/dL — ABNORMAL HIGH (ref 0.44–1.00)
GFR calc Af Amer: 37 mL/min — ABNORMAL LOW (ref >60)
GFR calc non Af Amer: 32 mL/min — ABNORMAL LOW (ref >60)
Glucose, Bld: 92 mg/dL (ref 70–99)
Potassium: 2.9 mmol/L — ABNORMAL LOW (ref 3.5–5.1)
Sodium: 137 mmol/L (ref 135–145)
Total Bilirubin: 1 mg/dL (ref 0.3–1.2)
Total Protein: 6.2 g/dL — ABNORMAL LOW (ref 6.5–8.1)

## 2019-02-17 LAB — CBC
HCT: 29.1 % — ABNORMAL LOW (ref 36.0–46.0)
Hemoglobin: 10 g/dL — ABNORMAL LOW (ref 12.0–15.0)
MCH: 32.6 pg (ref 26.0–34.0)
MCHC: 34.4 g/dL (ref 30.0–36.0)
MCV: 94.8 fL (ref 80.0–100.0)
Platelets: 184 10*3/uL (ref 150–400)
RBC: 3.07 MIL/uL — ABNORMAL LOW (ref 3.87–5.11)
RDW: 13.9 % (ref 11.5–15.5)
WBC: 13 10*3/uL — ABNORMAL HIGH (ref 4.0–10.5)
nRBC: 0 % (ref 0.0–0.2)

## 2019-02-17 LAB — URINALYSIS, MICROSCOPIC (REFLEX)

## 2019-02-17 LAB — URINALYSIS, ROUTINE W REFLEX MICROSCOPIC
Glucose, UA: NEGATIVE mg/dL
Ketones, ur: NEGATIVE mg/dL
Leukocytes,Ua: NEGATIVE
Nitrite: NEGATIVE
Protein, ur: 100 mg/dL — AB
Specific Gravity, Urine: >1.03 — ABNORMAL HIGH (ref 1.005–1.030)
pH: 5.5 (ref 5.0–8.0)

## 2019-02-17 LAB — D-DIMER, QUANTITATIVE: D-Dimer, Quant: 0.5 ug/mL-FEU (ref 0.00–0.50)

## 2019-02-17 LAB — C-REACTIVE PROTEIN: CRP: 2 mg/dL — ABNORMAL HIGH (ref <1.0)

## 2019-02-17 LAB — ABO/RH: ABO/RH(D): A POS

## 2019-02-17 MED ORDER — MOMETASONE FURO-FORMOTEROL FUM 100-5 MCG/ACT IN AERO
2.0000 | INHALATION_SPRAY | Freq: Two times a day (BID) | RESPIRATORY_TRACT | Status: DC
  Administered 2019-02-17 – 2019-02-25 (×16): 2 via RESPIRATORY_TRACT
  Filled 2019-02-17: qty 8.8

## 2019-02-17 MED ORDER — GUAIFENESIN-DM 100-10 MG/5ML PO SYRP: 10.0000 mL | ORAL_SOLUTION | ORAL | Status: DC | PRN

## 2019-02-17 MED ORDER — PANTOPRAZOLE SODIUM 40 MG PO TBEC
40.0000 mg | DELAYED_RELEASE_TABLET | Freq: Every day | ORAL | Status: DC
  Administered 2019-02-17 – 2019-02-25 (×9): 40 mg via ORAL
  Filled 2019-02-17 (×9): qty 1

## 2019-02-17 MED ORDER — ROSUVASTATIN CALCIUM 5 MG PO TABS
40.0000 mg | ORAL_TABLET | Freq: Every day | ORAL | Status: DC
  Administered 2019-02-17 – 2019-02-25 (×9): 40 mg via ORAL
  Filled 2019-02-17 (×2): qty 8
  Filled 2019-02-17: qty 2
  Filled 2019-02-17: qty 8
  Filled 2019-02-17: qty 2
  Filled 2019-02-17 (×5): qty 8

## 2019-02-17 MED ORDER — HYDROCODONE-ACETAMINOPHEN 5-325 MG PO TABS
2.0000 | ORAL_TABLET | Freq: Four times a day (QID) | ORAL | Status: DC | PRN
  Administered 2019-02-17 – 2019-02-25 (×15): 2 via ORAL
  Filled 2019-02-17 (×15): qty 2

## 2019-02-17 MED ORDER — MYCOPHENOLATE MOFETIL 250 MG PO CAPS
1000.0000 mg | ORAL_CAPSULE | Freq: Two times a day (BID) | ORAL | Status: DC
  Administered 2019-02-17 – 2019-02-25 (×17): 1000 mg via ORAL
  Filled 2019-02-17 (×22): qty 4

## 2019-02-17 MED ORDER — MESALAMINE 1.2 G PO TBEC
1.2000 g | DELAYED_RELEASE_TABLET | Freq: Every day | ORAL | Status: DC
  Filled 2019-02-17 (×2): qty 1

## 2019-02-17 MED ORDER — DESMOPRESSIN ACETATE 0.2 MG PO TABS
0.3000 mg | ORAL_TABLET | Freq: Every day | ORAL | Status: DC
  Administered 2019-02-17 – 2019-02-24 (×8): 0.3 mg via ORAL
  Filled 2019-02-17 (×9): qty 1

## 2019-02-17 MED ORDER — PYRIDOSTIGMINE BROMIDE 60 MG PO TABS
30.0000 mg | ORAL_TABLET | Freq: Three times a day (TID) | ORAL | Status: DC
  Administered 2019-02-17 – 2019-02-25 (×24): 30 mg via ORAL
  Filled 2019-02-17 (×28): qty 0.5

## 2019-02-17 MED ORDER — SODIUM CHLORIDE 0.9 % IV SOLN
INTRAVENOUS | Status: AC
  Administered 2019-02-17 – 2019-02-18 (×3): via INTRAVENOUS

## 2019-02-17 MED ORDER — METHYLPREDNISOLONE SODIUM SUCC 40 MG IJ SOLR
0.5000 mg/kg | Freq: Two times a day (BID) | INTRAMUSCULAR | Status: DC
  Administered 2019-02-17 – 2019-02-23 (×13): 34 mg via INTRAVENOUS
  Filled 2019-02-17 (×13): qty 1

## 2019-02-17 MED ORDER — ONDANSETRON HCL 4 MG/2ML IJ SOLN
4.0000 mg | Freq: Four times a day (QID) | INTRAMUSCULAR | Status: DC | PRN
  Administered 2019-02-19 – 2019-02-24 (×3): 4 mg via INTRAVENOUS
  Filled 2019-02-17 (×3): qty 2

## 2019-02-17 MED ORDER — MESALAMINE 400 MG PO CPDR
800.0000 mg | DELAYED_RELEASE_CAPSULE | Freq: Two times a day (BID) | ORAL | Status: DC
  Administered 2019-02-17 – 2019-02-25 (×17): 800 mg via ORAL
  Filled 2019-02-17 (×18): qty 2

## 2019-02-17 MED ORDER — ALBUTEROL SULFATE HFA 108 (90 BASE) MCG/ACT IN AERS
1.0000 | INHALATION_SPRAY | Freq: Four times a day (QID) | RESPIRATORY_TRACT | Status: DC | PRN
  Administered 2019-02-20 – 2019-02-21 (×2): 1 via RESPIRATORY_TRACT
  Filled 2019-02-17: qty 6.7

## 2019-02-17 MED ORDER — ONDANSETRON HCL 4 MG/2ML IJ SOLN
4.0000 mg | Freq: Once | INTRAMUSCULAR | Status: AC
  Administered 2019-02-17: 4 mg via INTRAVENOUS
  Filled 2019-02-17: qty 2

## 2019-02-17 MED ORDER — HEPARIN SODIUM (PORCINE) 5000 UNIT/ML IJ SOLN
5000.0000 [IU] | Freq: Three times a day (TID) | INTRAMUSCULAR | Status: DC
  Administered 2019-02-17 – 2019-02-24 (×23): 5000 [IU] via SUBCUTANEOUS
  Filled 2019-02-17 (×23): qty 1

## 2019-02-17 MED ORDER — ALPRAZOLAM 0.5 MG PO TABS
0.5000 mg | ORAL_TABLET | Freq: Every evening | ORAL | Status: DC | PRN
  Administered 2019-02-17 – 2019-02-24 (×8): 0.5 mg via ORAL
  Filled 2019-02-17 (×8): qty 1

## 2019-02-17 MED ORDER — ROPINIROLE HCL 1 MG PO TABS
1.0000 mg | ORAL_TABLET | Freq: Every day | ORAL | Status: DC
  Administered 2019-02-17 – 2019-02-24 (×8): 1 mg via ORAL
  Filled 2019-02-17 (×9): qty 1

## 2019-02-17 MED ORDER — MESALAMINE ER 0.375 G PO CP24: 1.5000 g | ORAL_CAPSULE | Freq: Every day | ORAL | Status: DC

## 2019-02-17 MED ORDER — ONDANSETRON HCL 4 MG PO TABS
4.0000 mg | ORAL_TABLET | Freq: Four times a day (QID) | ORAL | Status: DC | PRN
  Administered 2019-02-20 – 2019-02-25 (×4): 4 mg via ORAL
  Filled 2019-02-17 (×4): qty 1

## 2019-02-17 MED ORDER — ALPRAZOLAM 0.5 MG PO TABS
0.5000 mg | ORAL_TABLET | Freq: Once | ORAL | Status: AC
  Administered 2019-02-17: 0.5 mg via ORAL
  Filled 2019-02-17: qty 1

## 2019-02-17 NOTE — ED Notes (Signed)
Received report

## 2019-02-17 NOTE — Progress Notes (Signed)
RT discussed the use of incentive spirometer, flutter value, and a NIF.  Patient able to demonstrate back good technique with the IS and Flutter value.  IS able to reach 750 mL.  NIF > - 40 with a great effort.

## 2019-02-17 NOTE — Progress Notes (Signed)
Husband updated.

## 2019-02-17 NOTE — Progress Notes (Signed)
NIF= >-40cm H2O on three attempts with great effort.  RT will continue to monitor.

## 2019-02-17 NOTE — ED Notes (Signed)
Pt reports nausea has returned, tolerating oral fluids. EDP consulted, see new orders.

## 2019-02-17 NOTE — ED Notes (Signed)
Pt's husband updated with [facility] room number. Explained the delay was waiting for transport.

## 2019-02-17 NOTE — H&P (Signed)
History and Physical    Teresa Lee QIH:[id number] DOB: Jul 12, 1960 DOA: 02/16/2019  I have briefly reviewed the patient's prior medical records in [facility]  PCP: Martinique, Betty G, MD  Patient coming from: Home  Chief Complaint: Weakness, poor p.o. intake  HPI: Teresa Lee is a 58 y.o. female with medical history significant of myasthenia gravis, fibromyalgia, restless leg syndrome, Crohn's disease, hypertension, hyperlipidemia, presents to the hospital with chief complaint of weakness, poor p.o. intake, fatigue and generalized malaise.  She has been detected positive for coronavirus on 11/24 and has been trying to manage her symptoms at home.  She also been having nausea, vomiting and diarrhea.  She was barely able to eat anything in the last few days.  She denies any shortness of breath, denies any chest pain or palpitations.  She has no abdominal pain.  She denies any dysuria.  ED Course: In the emergency room she has low-grade temp 99.2, satting 99% on room air.  Her blood pressure is stable.  Her blood work is pertinent for creatinine of 1.8, white count 17.4, and a urinalysis which was cloudy, many bacteria without leukocytes.  Review of Systems: All systems reviewed, and apart from HPI, all negative  Past Medical History:  Diagnosis Date  . Arthritis    osteoarthritis  . Asthma   . Blind left eye   . Cancer ([place])    skin  . Crohn's disease ([place])   . CTS (carpal tunnel syndrome)   . DJD (degenerative joint disease)    Both cervical spine and LS spine  . Fibromyalgia   . GERD (gastroesophageal reflux disease)   . Heart murmur   . Herniated nucleus pulposus   . Hyperlipidemia   . Hypertension   . IBS (irritable bowel syndrome)   . Leukocytosis   . Myasthenia gravis   . Myasthenia gravis ([place])   . Spinal cord stimulator status     Past Surgical History:  Procedure Laterality Date  . BREAST REDUCTION SURGERY    . CARPAL TUNNEL RELEASE     bilateral  . cervical disc inj    . CHOLECYSTECTOMY    . ENDOMETRIAL ABLATION W/ NOVASURE    . MELANOMA EXCISION    . NASAL SINUS SURGERY    . THYMECTOMY     due to myastenia gravis  . THYMECTOMY       reports that she has never smoked. She has never used smokeless tobacco. She reports current alcohol use. She reports that she does not use drugs.  Allergies  Allergen Reactions  . Latex Other (See Comments)    BLISTER  . Lorazepam     Other reaction(s): Hallucinations  . Percocet [Oxycodone-Acetaminophen] Hives  . Sulfa Antibiotics Hives  . Sulfamethoxazole-Trimethoprim Hives  . Tapentadol Hcl Other (See Comments)    HEADACHES NUCENTA  . Nucynta [Tapentadol]     Headache   . Orange Concentrate [Flavoring Agent]     Acid reflux  . Other Other (See Comments), Hives and Nausea Only    Some nuts cause a rash  . Tomato     Burns skin  . Bioflavonoids Rash    Causes burning of skin and blisters  . Cefixime     Due to myasthenia gravis    Family History  Problem Relation Age of Onset  . Asthma Daughter   . Heart disease Mother   . Cancer Mother   . Hyperlipidemia Mother   . Hypertension Mother   . Diabetes  Father     Prior to Admission medications   Medication Sig Start Date End Date Taking? Authorizing Provider  albuterol (PROVENTIL HFA) 108 (90 Base) MCG/ACT inhaler Inhale 1 puff into the lungs every 6 (six) hours as needed for wheezing or shortness of breath. 07/02/18  Yes Martinique, Betty G, MD  albuterol (PROVENTIL) (2.5 MG/3ML) 0.083% nebulizer solution Take 2.5 mg by nebulization every 6 (six) hours as needed for wheezing or shortness of breath.    Yes [provider]  ALPRAZolam Duanne Moron) 0.5 MG tablet Take 0.5 mg by mouth 3 (three) times daily as needed for anxiety or sleep (usually one tab at bedtime , if needed).  02/12/19  Yes [provider]  budesonide-formoterol (SYMBICORT) 80-4.5 MCG/ACT inhaler Inhale 2 puffs into the lungs 2 (two) times  daily. 08/30/18  Yes [place], Betty G, MD  cyanocobalamin (,VITAMIN B-12,) 1000 MCG/ML injection Inject 1,000 mcg into the muscle every 30 (thirty) days.   Yes [provider]  desmopressin (DDAVP) 0.1 MG tablet Take 0.3 mg by mouth daily.    Yes [provider]  estradiol (ESTRACE) 2 MG tablet Take 2 mg by mouth at bedtime.    Yes [provider]  HYDROcodone-acetaminophen (NORCO) 10-325 MG tablet Take 1-2 tablets by mouth every 6 (six) hours as needed for moderate pain (total 3 tabs daily).  08/25/17  Yes [provider]  losartan (COZAAR) 50 MG tablet TAKE 1 TABLET DAILY Patient taking differently: Take 50 mg by mouth daily.  01/22/19  Yes [place]ique, Betty G, MD  meloxicam (MOBIC) 15 MG tablet TAKE 1 TABLET DAILY Patient taking differently: Take 15 mg by mouth daily.  11/22/18  Yes [place], Betty G, MD  Mesalamine (ASACOL) 400 MG CPDR DR capsule Take 800 mg by mouth 2 (two) times daily. 12/10/18  Yes [provider]  methocarbamol (ROBAXIN) 750 MG tablet Take 750 mg by mouth at bedtime as needed for muscle spasms.  02/07/19  Yes [provider]  mycophenolate (CELLCEPT) 500 MG tablet Take 1,000 mg by mouth 2 (two) times daily.    Yes [provider]  omega-3 acid ethyl esters (LOVAZA) 1 g capsule Take 1 capsule (1 g total) by mouth 2 (two) times daily. 02/02/19  Yes [place], Betty G, MD  ondansetron (ZOFRAN) 4 MG tablet Take 4 mg by mouth as needed for nausea/vomiting. 01/22/19  Yes [provider]  pantoprazole (PROTONIX) 40 MG tablet TAKE 1 TABLET DAILY Patient taking differently: Take 40 mg by mouth daily.  12/27/18  Yes [place], Betty G, MD  potassium chloride SA (KLOR-CON) 20 MEQ tablet Take 1/2 TO 1 TABLET DAILY Patient taking differently: Take 10-20 mEq by mouth daily.  12/27/18  Yes [place], Betty G, MD  predniSONE (DELTASONE) 10 MG tablet Take 10 mg by mouth daily.    Yes [provider]  progesterone (PROMETRIUM) 100 MG  capsule Take 100 mg by mouth at bedtime.   Yes [provider]  pyridostigmine (MESTINON) 60 MG tablet Take 30 mg by mouth 3 (three) times daily. Take 1/2 tablet 3 times per day 12/27/17  Yes [provider]  rOPINIRole (REQUIP) 1 MG tablet TAKE 1 OR 2 TABLETS AT BEDTIME Patient taking differently: Take 1-2 mg by mouth at bedtime.  01/27/19  Yes [place], Betty G, MD  rosuvastatin (CRESTOR) 40 MG tablet Take 1 tablet (40 mg total) by mouth daily. 02/02/19  Yes [place], Betty G, MD  triamterene-hydrochlorothiazide (DYAZIDE) 37.5-25 MG capsule TAKE (1) CAPSULE DAILY  IN THE MORNING. Patient taking differently: Take 1 capsule by mouth daily.  12/20/18  Yes [place], Betty G, MD  vitamin B-12 (CYANOCOBALAMIN) 1000 MCG tablet Take 1,000 mcg by mouth daily.   Yes [provider]  Vitamin D, Ergocalciferol, (DRISDOL) 1.25 MG (50000 UT) CAPS capsule TAKE 1 CAPSULE EVERY 2 WEEKS 08/27/18  Yes [place], Betty G, MD  Spacer/Aero-Holding Chambers (AEROCHAMBER PLUS) inhaler Use as instructed to use with inahaler. 08/30/18   [place], Betty G, MD    Physical Exam: Vitals:   02/17/19 0726 02/17/19 0730 02/17/19 0833 02/17/19 0926  BP:  99/61  110/67  Pulse: 78 82 76 77  Resp: 17 17 17 20   Temp:    99.2 F (37.3 C)  TempSrc:    Oral  SpO2: 97% 97% 97% 99%  Weight:      Height:        Constitutional: No distress, comfortable but appears extremely weak, Eyes: PERRL, lids and conjunctivae normal, ptosis noted ENMT: Mucous membranes are moist.  Neck: normal, supple Respiratory: clear to auscultation bilaterally, no wheezing, no crackles. Normal respiratory effort. No accessory muscle use.  Cardiovascular: Regular rate and rhythm, no murmurs / rubs / gallops. No extremity edema.  Abdomen: no tenderness, no masses palpated. Bowel sounds positive.  Musculoskeletal: no clubbing / cyanosis. Skin: no rashes Neurologic: Generalized weakness, no focal deficits Psychiatric: Normal judgment and  insight. Alert and oriented x 3. Normal mood.   Labs on Admission: I have personally reviewed following labs and imaging studies  CBC: Recent Labs  Lab 02/16/19 1733  WBC 17.4*  NEUTROABS 13.4*  HGB 13.2  HCT 39.3  MCV 94.2  PLT 742   Basic Metabolic Panel: Recent Labs  Lab 02/16/19 1733  NA 136  K 3.4*  CL 96*  CO2 21*  GLUCOSE 144*  BUN 34*  CREATININE 1.82*  CALCIUM 9.5   Liver Function Tests: Recent Labs  Lab 02/16/19 1733  AST 18  ALT 9  ALKPHOS 67  BILITOT 1.2  PROT 8.5*  ALBUMIN 3.9   Coagulation Profile: No results for input(s): INR, PROTIME in the last 168 hours. BNP (last 3 results) No results for input(s): PROBNP in the last 8760 hours. CBG: No results for input(s): GLUCAP in the last 168 hours. Thyroid Function Tests: No results for input(s): TSH, T4TOTAL, FREET4, T3FREE, THYROIDAB in the last 72 hours. Urine analysis:    Component Value Date/Time   COLORURINE AMBER (A) 02/17/2019 0305   APPEARANCEUR CLOUDY (A) 02/17/2019 0305   LABSPEC >1.030 (H) 02/17/2019 0305   PHURINE 5.5 02/17/2019 0305   GLUCOSEU NEGATIVE 02/17/2019 0305   HGBUR MODERATE (A) 02/17/2019 0305   BILIRUBINUR SMALL (A) 02/17/2019 0305   KETONESUR NEGATIVE 02/17/2019 0305   PROTEINUR 100 (A) 02/17/2019 0305   NITRITE NEGATIVE 02/17/2019 0305   LEUKOCYTESUR NEGATIVE 02/17/2019 0305     Radiological Exams on Admission: Dg Chest Portable 1 View  Result Date: 02/16/2019 CLINICAL DATA:  Cough, congestion.  Fever. EXAM: PORTABLE CHEST 1 VIEW COMPARISON:  Chest radiograph 09/05/2017 FINDINGS: Normal cardiac and mediastinal contours status post median sternotomy. No consolidative pulmonary opacities. No pleural effusion or pneumothorax. Thoracic spinal stimulator device. IMPRESSION: No acute cardiopulmonary process. Electronically Signed   By: Lovey Newcomer M.D.   On: 02/16/2019 17:47   EKG: Independently reviewed. Sinus rhythm   Assessment/Plan Active Problems:   AKI  (acute kidney injury) ([place])   Acute on chronic renal failure ([facility])   Principal Problem COVID-19 viral illness -Mainly  with GI symptoms with nausea, vomiting and diarrhea -Patient significantly dehydrated due to poor p.o. intake, will place on IV fluids for at least 24 hours and continue to monitor -Provide supportive treatment with antiemetics, advance diet as tolerated -Labs this morning pending  COVID-19 Labs  No results for input(s): DDIMER, FERRITIN, LDH, CRP in the last 72 hours.  Lab Results  Component Value Date   SARSCOV2NAA Detected (A) 02/11/2019   Active Problems Myasthenia gravis -Monitor NIFs, resume home medications with CellCept, Mestinon -On chronic steroids, use higher dose given acute illness  Acute kidney injury -Likely in the setting of poor p.o. intake and dehydration, provide IV fluids and monitor renal function.  Hold losartan  Hypertension -Hold losartan given acute kidney injury, provide fluids and monitor  Crohn's disease -Continue mesalamine, does not seem to be active  Restless leg syndrome -Continue Requip  Hyperlipidemia -Continue statin  GERD -Continue PPI  Bacteriuria -no symptoms, no WBC in the urine, monitor. Urine cultures sent  Anxiety -Continue home Xanax  Nocturnal polyuria -On chronic DDAVP, continue   Scheduled Meds: . desmopressin  0.3 mg Oral QHS  . heparin  5,000 Units Subcutaneous Q8H  . mesalamine  1.5 g Oral Daily  . methylPREDNISolone (SOLU-MEDROL) injection  0.5 mg/kg Intravenous Q12H  . mycophenolate  500 mg Oral BID  . pantoprazole  40 mg Oral Daily  . pyridostigmine  60 mg Oral Q8H  . rOPINIRole  1 mg Oral QHS  . rosuvastatin  40 mg Oral Daily   Continuous Infusions: . sodium chloride     PRN Meds:.albuterol, ALPRAZolam, guaiFENesin-dextromethorphan, HYDROcodone-acetaminophen, ondansetron **OR** ondansetron (ZOFRAN) IV   DVT prophylaxis: heparin  Code Status: Full code  Family Communication: d/w  patient Disposition Plan: home when ready  Bed Type: telemetry  Consults called: none   Obs/Inp: Inpatient  Marzetta Board, MD, PhD [facility]  Contact via [email or web address]  02/17/2019, 12:45 PM

## 2019-02-17 NOTE — ED Notes (Addendum)
Pt requesting xanax, states she takes it at home for sleep. Pending transport via [facility] for admission at [facility]. Consulted EDP, see new orders.

## 2019-02-18 DIAGNOSIS — K50919 Crohn's disease, unspecified, with unspecified complications: Secondary | ICD-10-CM

## 2019-02-18 LAB — CBC WITH DIFFERENTIAL/PLATELET
Abs Immature Granulocytes: 0.86 10*3/uL — ABNORMAL HIGH (ref 0.00–0.07)
Basophils Absolute: 0.1 10*3/uL (ref 0.0–0.1)
Basophils Relative: 1 %
Eosinophils Absolute: 0 10*3/uL (ref 0.0–0.5)
Eosinophils Relative: 0 %
HCT: 26.9 % — ABNORMAL LOW (ref 36.0–46.0)
Hemoglobin: 9 g/dL — ABNORMAL LOW (ref 12.0–15.0)
Immature Granulocytes: 9 %
Lymphocytes Relative: 7 %
Lymphs Abs: 0.7 10*3/uL (ref 0.7–4.0)
MCH: 31.8 pg (ref 26.0–34.0)
MCHC: 33.5 g/dL (ref 30.0–36.0)
MCV: 95.1 fL (ref 80.0–100.0)
Monocytes Absolute: 0.3 10*3/uL (ref 0.1–1.0)
Monocytes Relative: 3 %
Neutro Abs: 7.5 10*3/uL (ref 1.7–7.7)
Neutrophils Relative %: 80 %
Platelets: 194 10*3/uL (ref 150–400)
RBC: 2.83 MIL/uL — ABNORMAL LOW (ref 3.87–5.11)
RDW: 13.2 % (ref 11.5–15.5)
WBC: 9.3 10*3/uL (ref 4.0–10.5)
nRBC: 0 % (ref 0.0–0.2)

## 2019-02-18 LAB — COMPREHENSIVE METABOLIC PANEL
ALT: 10 U/L (ref 0–44)
AST: 16 U/L (ref 15–41)
Albumin: 2.8 g/dL — ABNORMAL LOW (ref 3.5–5.0)
Alkaline Phosphatase: 47 U/L (ref 38–126)
Anion gap: 14 (ref 5–15)
BUN: 36 mg/dL — ABNORMAL HIGH (ref 6–20)
CO2: 18 mmol/L — ABNORMAL LOW (ref 22–32)
Calcium: 7.6 mg/dL — ABNORMAL LOW (ref 8.9–10.3)
Chloride: 106 mmol/L (ref 98–111)
Creatinine, Ser: 1.67 mg/dL — ABNORMAL HIGH (ref 0.44–1.00)
GFR calc Af Amer: 39 mL/min — ABNORMAL LOW (ref >60)
GFR calc non Af Amer: 34 mL/min — ABNORMAL LOW (ref >60)
Glucose, Bld: 199 mg/dL — ABNORMAL HIGH (ref 70–99)
Potassium: 3.5 mmol/L (ref 3.5–5.1)
Sodium: 138 mmol/L (ref 135–145)
Total Bilirubin: 0.5 mg/dL (ref 0.3–1.2)
Total Protein: 6 g/dL — ABNORMAL LOW (ref 6.5–8.1)

## 2019-02-18 LAB — D-DIMER, QUANTITATIVE: D-Dimer, Quant: 0.44 ug/mL-FEU (ref 0.00–0.50)

## 2019-02-18 LAB — C-REACTIVE PROTEIN: CRP: 2.8 mg/dL — ABNORMAL HIGH (ref <1.0)

## 2019-02-18 MED ORDER — SODIUM CHLORIDE 0.9 % IV SOLN
INTRAVENOUS | Status: DC
  Administered 2019-02-18 – 2019-02-19 (×3): via INTRAVENOUS

## 2019-02-18 MED ORDER — SODIUM CHLORIDE 0.9 % IV SOLN
1.0000 g | INTRAVENOUS | Status: DC
  Administered 2019-02-18: 1 g via INTRAVENOUS
  Filled 2019-02-18 (×3): qty 10

## 2019-02-18 NOTE — Progress Notes (Signed)
Called 3rd floor Teresa Lee with report for patient transfer to [phone]

## 2019-02-18 NOTE — Progress Notes (Signed)
Patient is improving today.  She was transferred to the third floor for med-surg care.  Patient ambulated to bathroom and back to bed.  She complained of no pain. She consumed her meals.  Vitals WDL. Breath sounds WDL.

## 2019-02-18 NOTE — Progress Notes (Signed)
Patient completed NIF x 2 >-40 on each attempt.

## 2019-02-18 NOTE — Progress Notes (Addendum)
PROGRESS NOTE  Teresa Lee LAG:[id number] DOB: 15-Feb-1961 DOA: 02/16/2019 PCP: Martinique, Betty G, MD   LOS: 2 days   Brief Narrative / Interim history: Teresa Lee is a 58 y.o. female with medical history significant of myasthenia gravis, fibromyalgia, restless leg syndrome, Crohn's disease, hypertension, hyperlipidemia, presents to the hospital with chief complaint of weakness, poor p.o. intake, fatigue and generalized malaise.  She has been detected positive for coronavirus on 11/24 and has been trying to manage her symptoms at home.  She also been having nausea, vomiting and diarrhea.  She was barely able to eat anything in the last few days.    She had no respiratory symptoms on admission  Subjective / 24h Interval events: Continues to feel weak and tells me that she is barely able to talk.  She denies any shortness of breath, denies any palpitations.  No chest pain.  No abdominal pain.  Nausea and vomiting seem to be better.  Had diarrhea yesterday but nothing since.  Appetite is getting a little bit better  Assessment & Plan: Active Problems:   AKI (acute kidney injury) ([place])   Acute on chronic renal failure (HCC)   Principal Problem Covid-19 Viral Illness -It appears mainly with GI symptoms, chest x-ray on admission was clear, she is on room air and has no respiratory symptoms.  No Remdesivir or steroids needed at this point -Continue supportive treatment   COVID-19 Labs  Recent Labs    02/17/19 1210 02/18/19 0350  DDIMER 0.50 0.44  CRP 2.0* 2.8*    Lab Results  Component Value Date   SARSCOV2NAA Detected (A) 02/11/2019   Active Problems Myasthenia gravis -Monitor NIFs, stable, continue home medication with CellCept, Mestinon  -On chronic steroids, use higher dose given acute illness  Acute kidney injury -Likely in the setting of poor p.o. intake and dehydration.  Hold losartan -Creatinine with slight improvement today however still elevated, she has  poor p.o. intake and unable to eat and drink adequately, continue IV fluids for another 24 hours  Hypertension -Hold losartan given acute kidney injury, continue fluids, creatinine better but still elevated  Crohn's disease -Continue mesalamine, does not seem to be active  Restless leg syndrome -Continue Requip  Hyperlipidemia -Continue statin  GERD -Continue PPI  Bacteriuria, ?true UTI -no symptoms, no leukocytes in the urine, monitor. Urine cultures sent and today speciating GNRs -she did have a leukocytosis on admission which is improving however clinically she continues to have poor appetite, I wonder whether this is contributing, I would favor to add Ceftriaxone while awaiting final urine cultures. D/w pharmacy, relatively safe with myasthenia gravis.  Anxiety -Continue home Xanax  Nocturnal polyuria -On chronic DDAVP, continue  Scheduled Meds: . desmopressin  0.3 mg Oral QHS  . heparin  5,000 Units Subcutaneous Q8H  . Mesalamine  800 mg Oral BID  . methylPREDNISolone (SOLU-MEDROL) injection  0.5 mg/kg Intravenous Q12H  . mometasone-formoterol  2 puff Inhalation BID  . mycophenolate  1,000 mg Oral BID  . pantoprazole  40 mg Oral Daily  . pyridostigmine  30 mg Oral Q8H  . rOPINIRole  1 mg Oral QHS  . rosuvastatin  40 mg Oral Daily   Continuous Infusions: . sodium chloride 100 mL/hr at 02/18/19 1100  . cefTRIAXone (ROCEPHIN)  IV     PRN Meds:.albuterol, ALPRAZolam, guaiFENesin-dextromethorphan, HYDROcodone-acetaminophen, ondansetron **OR** ondansetron (ZOFRAN) IV  DVT prophylaxis: Heparin Code Status: Full code Family Communication: d/w husband Teresa Lee over the phone [phone] Disposition Plan: Home 2  to 3 days once weakness and AKI improves  Consultants:  None  Procedures:  None   Microbiology: None   Antimicrobials: None    Objective: Vitals:   02/18/19 0708 02/18/19 0759 02/18/19 1400 02/18/19 1500  BP: (!) 136/92   137/78   Pulse: (!) 55 65 69 70  Resp: 18 16 19 13   Temp: 98.3 F (36.8 C)   97.9 F (36.6 C)  TempSrc: Oral   Oral  SpO2: 92% 98% 98% 99%  Weight:      Height:        Intake/Output Summary (Last 24 hours) at 02/18/2019 1626 Last data filed at 02/18/2019 1100 Gross per 24 hour  Intake 2042.92 ml  Output -  Net 2042.92 ml   Filed Weights   02/16/19 1521  Weight: 67.7 kg    Examination:  Constitutional: NAD Eyes: no scleral icterus ENMT: Mucous membranes are moist.  Neck: normal, supple Respiratory: clear to auscultation bilaterally, no wheezing, no crackles.  Weak respiratory effort Cardiovascular: Regular rate and rhythm, no murmurs / rubs / gallops. No LE edema.  Abdomen: non distended, no tenderness. Bowel sounds positive.  Musculoskeletal: no clubbing / cyanosis.  Skin: no rashes Neurologic: Nonfocal, equal strength.  Overall weak  Data Reviewed: I have independently reviewed following labs and imaging studies   CBC: Recent Labs  Lab 02/16/19 1733 02/17/19 1210 02/18/19 0350  WBC 17.4* 13.0* 9.3  NEUTROABS 13.4*  --  7.5  HGB 13.2 10.0* 9.0*  HCT 39.3 29.1* 26.9*  MCV 94.2 94.8 95.1  PLT 275 184 956   Basic Metabolic Panel: Recent Labs  Lab 02/16/19 1733 02/17/19 1210 02/18/19 0350  NA 136 137 138  K 3.4* 2.9* 3.5  CL 96* 103 106  CO2 21* 21* 18*  GLUCOSE 144* 92 199*  BUN 34* 36* 36*  CREATININE 1.82* 1.73* 1.67*  CALCIUM 9.5 7.8* 7.6*   GFR: Estimated Creatinine Clearance: 32.7 mL/min (A) (by C-G formula based on SCr of 1.67 mg/dL (H)). Liver Function Tests: Recent Labs  Lab 02/16/19 1733 02/17/19 1210 02/18/19 0350  AST 18 17 16   ALT 9 9 10   ALKPHOS 67 50 47  BILITOT 1.2 1.0 0.5  PROT 8.5* 6.2* 6.0*  ALBUMIN 3.9 3.0* 2.8*   No results for input(s): LIPASE, AMYLASE in the last 168 hours. No results for input(s): AMMONIA in the last 168 hours. Coagulation Profile: No results for input(s): INR, PROTIME in the last 168 hours. Cardiac  Enzymes: No results for input(s): CKTOTAL, CKMB, CKMBINDEX, TROPONINI in the last 168 hours. BNP (last 3 results) No results for input(s): PROBNP in the last 8760 hours. HbA1C: No results for input(s): HGBA1C in the last 72 hours. CBG: No results for input(s): GLUCAP in the last 168 hours. Lipid Profile: No results for input(s): CHOL, HDL, LDLCALC, TRIG, CHOLHDL, LDLDIRECT in the last 72 hours. Thyroid Function Tests: No results for input(s): TSH, T4TOTAL, FREET4, T3FREE, THYROIDAB in the last 72 hours. Anemia Panel: No results for input(s): VITAMINB12, FOLATE, FERRITIN, TIBC, IRON, RETICCTPCT in the last 72 hours. Urine analysis:    Component Value Date/Time   COLORURINE AMBER (A) 02/17/2019 0305   APPEARANCEUR CLOUDY (A) 02/17/2019 0305   LABSPEC >1.030 (H) 02/17/2019 0305   PHURINE 5.5 02/17/2019 0305   GLUCOSEU NEGATIVE 02/17/2019 0305   HGBUR MODERATE (A) 02/17/2019 0305   BILIRUBINUR SMALL (A) 02/17/2019 0305   KETONESUR NEGATIVE 02/17/2019 0305   PROTEINUR 100 (A) 02/17/2019 0305   NITRITE NEGATIVE 02/17/2019 0305  LEUKOCYTESUR NEGATIVE 02/17/2019 0305   Sepsis Labs: Invalid input(s): PROCALCITONIN, LACTICIDVEN  Recent Results (from the past 240 hour(s))  Novel Coronavirus, NAA ([facility])     Status: Abnormal   Collection Time: 02/11/19 12:00 AM   Specimen: Oropharyngeal(OP) collection in vial transport medium   OROPHARYNGEA  TESTING  Result Value Ref Range Status   SARS-CoV-2, NAA Detected (A) Not Detected Final    Comment: This nucleic acid amplification test was developed and its performance characteristics determined by [employer]. Nucleic acid amplification tests include PCR and TMA. This test has not been FDA cleared or approved. This test has been authorized by FDA under an Emergency Use Authorization (EUA). This test is only authorized for the duration of time the declaration that circumstances exist justifying the authorization of the emergency  use of in vitro diagnostic tests for detection of SARS-CoV-2 virus and/or diagnosis of COVID-19 infection under section 564(b)(1) of the Act, 21 U.S.C. 564PPI-9(J) (1), unless the authorization is terminated or revoked sooner. When diagnostic testing is negative, the possibility of a false negative result should be considered in the context of a patient's recent exposures and the presence of clinical signs and symptoms consistent with COVID-19. An individual without symptoms of COVID-19 and who is not shedding SARS-CoV-2 virus would  expect to have a negative (not detected) result in this assay.   Culture, blood (routine x 2)     Status: None (Preliminary result)   Collection Time: 02/16/19  5:59 PM   Specimen: BLOOD  Result Value Ref Range Status   Specimen Description   Final    BLOOD LEFT ANTECUBITAL Performed at [facility], [address] [place], [name] [address]    Special Requests   Final    BOTTLES DRAWN AEROBIC AND ANAEROBIC Blood Culture results may not be optimal due to an inadequate volume of blood received in culture bottles Performed at [facility], [address] [place], [name] [address]    Culture   Final    NO GROWTH 2 DAYS Performed at [facility], [place] [address], [place], [place] [address]    Report Status PENDING  Incomplete  Culture, blood (routine x 2)     Status: None (Preliminary result)   Collection Time: 02/16/19  6:21 PM   Specimen: BLOOD  Result Value Ref Range Status   Specimen Description   Final    BLOOD RIGHT HAND Performed at [facility], [address] [place], [name] [address]    Special Requests   Final    BOTTLES DRAWN AEROBIC ONLY Blood Culture results may not be optimal due to an inadequate volume of blood received in culture bottles Performed at [facility], [address] [place], [name] [address]    Culture   Final    NO GROWTH 2 DAYS Performed at [place], [place] [address], [place], [place] [address]    Report Status PENDING  Incomplete  Culture, Urine     Status: Abnormal (Preliminary result)   Collection Time: 02/17/19  9:27 AM   Specimen: Urine, Clean Catch  Result Value Ref Range Status   Specimen Description   Final    URINE, CLEAN CATCH Performed at [facility], [place] [address], [place], [place] [address]    Special Requests   Final    NONE Performed at [facility], [place] [address], [place], [place] [address]  Culture >=100,000 COLONIES/mL GRAM NEGATIVE RODS (A)  Final   Report Status PENDING  Incomplete      Radiology Studies: Dg Chest Portable 1 View  Result Date: 02/16/2019 CLINICAL DATA:  Cough, congestion.  Fever. EXAM: PORTABLE CHEST 1 VIEW COMPARISON:  Chest radiograph 09/05/2017 FINDINGS: Normal cardiac and mediastinal contours status post median sternotomy. No consolidative pulmonary opacities. No pleural effusion or pneumothorax. Thoracic spinal stimulator device. IMPRESSION: No acute cardiopulmonary process. Electronically Signed   By: Lovey Newcomer M.D.   On: 02/16/2019 17:47     Marzetta Board, MD, PhD [facility]  Contact via  [email or web address]  [place] P: [phone] F: [phone]

## 2019-02-19 ENCOUNTER — Inpatient Hospital Stay (HOSPITAL_COMMUNITY): Payer: Medicare HMO

## 2019-02-19 DIAGNOSIS — N179 Acute kidney failure, unspecified: Secondary | ICD-10-CM

## 2019-02-19 DIAGNOSIS — U071 COVID-19: Secondary | ICD-10-CM

## 2019-02-19 LAB — URINE CULTURE: Culture: >=100000 — AB

## 2019-02-19 LAB — CBC WITH DIFFERENTIAL/PLATELET
Abs Immature Granulocytes: 1 10*3/uL — ABNORMAL HIGH (ref 0.00–0.07)
Band Neutrophils: 8 %
Basophils Absolute: 0 10*3/uL (ref 0.0–0.1)
Basophils Relative: 0 %
Eosinophils Absolute: 0 10*3/uL (ref 0.0–0.5)
Eosinophils Relative: 0 %
HCT: 25.4 % — ABNORMAL LOW (ref 36.0–46.0)
Hemoglobin: 8.4 g/dL — ABNORMAL LOW (ref 12.0–15.0)
Lymphocytes Relative: 3 %
Lymphs Abs: 0.5 10*3/uL — ABNORMAL LOW (ref 0.7–4.0)
MCH: 31.9 pg (ref 26.0–34.0)
MCHC: 33.1 g/dL (ref 30.0–36.0)
MCV: 96.6 fL (ref 80.0–100.0)
Monocytes Absolute: 0.5 10*3/uL (ref 0.1–1.0)
Monocytes Relative: 3 %
Myelocytes: 6 %
Neutro Abs: 14.3 10*3/uL — ABNORMAL HIGH (ref 1.7–7.7)
Neutrophils Relative %: 80 %
Platelets: 208 10*3/uL (ref 150–400)
RBC: 2.63 MIL/uL — ABNORMAL LOW (ref 3.87–5.11)
RDW: 13.2 % (ref 11.5–15.5)
WBC: 16.3 10*3/uL — ABNORMAL HIGH (ref 4.0–10.5)
nRBC: 0 % (ref 0.0–0.2)

## 2019-02-19 LAB — COMPREHENSIVE METABOLIC PANEL
ALT: 12 U/L (ref 0–44)
AST: 20 U/L (ref 15–41)
Albumin: 2.7 g/dL — ABNORMAL LOW (ref 3.5–5.0)
Alkaline Phosphatase: 45 U/L (ref 38–126)
Anion gap: 15 (ref 5–15)
BUN: 29 mg/dL — ABNORMAL HIGH (ref 6–20)
CO2: 17 mmol/L — ABNORMAL LOW (ref 22–32)
Calcium: 7.4 mg/dL — ABNORMAL LOW (ref 8.9–10.3)
Chloride: 110 mmol/L (ref 98–111)
Creatinine, Ser: 1.22 mg/dL — ABNORMAL HIGH (ref 0.44–1.00)
GFR calc Af Amer: 57 mL/min — ABNORMAL LOW (ref >60)
GFR calc non Af Amer: 49 mL/min — ABNORMAL LOW (ref >60)
Glucose, Bld: 161 mg/dL — ABNORMAL HIGH (ref 70–99)
Potassium: 3.6 mmol/L (ref 3.5–5.1)
Sodium: 142 mmol/L (ref 135–145)
Total Bilirubin: 0.3 mg/dL (ref 0.3–1.2)
Total Protein: 5.9 g/dL — ABNORMAL LOW (ref 6.5–8.1)

## 2019-02-19 LAB — D-DIMER, QUANTITATIVE: D-Dimer, Quant: 0.59 ug/mL-FEU — ABNORMAL HIGH (ref 0.00–0.50)

## 2019-02-19 LAB — MAGNESIUM: Magnesium: 1.4 mg/dL — ABNORMAL LOW (ref 1.7–2.4)

## 2019-02-19 LAB — C-REACTIVE PROTEIN: CRP: 1.8 mg/dL — ABNORMAL HIGH (ref <1.0)

## 2019-02-19 MED ORDER — LOSARTAN POTASSIUM 25 MG PO TABS
50.0000 mg | ORAL_TABLET | Freq: Every day | ORAL | Status: DC
  Administered 2019-02-20 – 2019-02-25 (×6): 50 mg via ORAL
  Filled 2019-02-19 (×6): qty 2

## 2019-02-19 MED ORDER — CEFAZOLIN SODIUM-DEXTROSE 1-4 GM/50ML-% IV SOLN
1.0000 g | Freq: Three times a day (TID) | INTRAVENOUS | Status: DC
  Administered 2019-02-19 – 2019-02-24 (×16): 1 g via INTRAVENOUS
  Filled 2019-02-19 (×17): qty 50

## 2019-02-19 MED ORDER — MOMETASONE FURO-FORMOTEROL FUM 100-5 MCG/ACT IN AERO: 2.0000 | INHALATION_SPRAY | Freq: Two times a day (BID) | RESPIRATORY_TRACT | Status: DC

## 2019-02-19 MED ORDER — MAGNESIUM OXIDE 400 (241.3 MG) MG PO TABS
400.0000 mg | ORAL_TABLET | Freq: Two times a day (BID) | ORAL | Status: AC
  Administered 2019-02-19 – 2019-02-20 (×4): 400 mg via ORAL
  Filled 2019-02-19 (×4): qty 1

## 2019-02-19 MED ORDER — LOSARTAN POTASSIUM 25 MG PO TABS: 50.0000 mg | ORAL_TABLET | Freq: Every day | ORAL | Status: DC

## 2019-02-19 NOTE — Progress Notes (Addendum)
PROGRESS NOTE    Teresa Lee  PNT:[id number] DOB: 1960/09/10 DOA: 02/16/2019 PCP: [place], Betty G, MD   Brief Narrative:  Teresa Lee a 58 y.o.femalewith medical history significant ofmyasthenia gravis, fibromyalgia, restless leg syndrome, Crohn's disease, hypertension, hyperlipidemia, presents to the hospital with chief complaint of weakness, poor p.o. intake, fatigue and generalized malaise. She has been detected positive for coronavirus on 11/24and has been trying to manage her symptoms at home. She also been having nausea, vomiting and diarrhea. She was barely able to eat anything in the last few days.   She had no respiratory symptoms on admission  Assessment & Plan:   Active Problems:   AKI (acute kidney injury) ([place])   Acute on chronic renal failure (HCC)  Urinary Tract Infection  E. Coli and Proteus UTI: - question of whether this was true UTI in prior notes, but given her generalized malaise, currently treating with ceftriaxone -> will narrow to ancef while inpatient - Blood cx NGTD  COVID 19 Viral Illness:  - no resp sx at this time, negative CXR on admission - was c/o poor PO intake, N/V/D at presentation - repeat imaging as needed -> follow CXR today - hold off on remdesivir at this point, her steroids were increased as noted below due to her being on chronic steroids with acute illness  COVID-19 Labs  Recent Labs    02/17/19 1210 02/18/19 0350 02/19/19 0540  DDIMER 0.50 0.44 0.59*  CRP 2.0* 2.8* 1.8*    Lab Results  Component Value Date   SARSCOV2NAA Detected (A) 02/11/2019   Myasthenia gravis -Monitor NIFs, stable, continue home medication with CellCept, Mestinon  -On chronic steroids, use higher dose given acute illness  Acute Kidney Injury: baseline creatinine is <1.  Improving at this time.  Hypertension: losartan on hold with AKI.  BP starting to rise gradually.  Will restart lostartan tomorrow.  Crohn's Disease: continue  mesalamine  RLS: requip  Hyperlipidemia: continue statin  GERD: continue PPI  Anxiety: continue xanax  Nocturnal Polyuria: continue DDAVP  Leukocytosis: likely 2/2 steroids  NAGMA: 2/2 NS resuscitation, continue to follow  Hypomagnesemia: replace and follow - replace orally, caution with MG  DVT prophylaxis: heparin  Code Status: full  Family Communication: none at bedside - called husband, no answer Disposition Plan:pending further improvement  Consultants:   none  Procedures:  none  Antimicrobials:  Anti-infectives (From admission, onward)   Start     Dose/Rate Route Frequency Ordered Stop   02/18/19 1700  cefTRIAXone (ROCEPHIN) 1 g in sodium chloride 0.9 % 100 mL IVPB     1 g 200 mL/hr over 30 Minutes Intravenous Every 24 hours 02/18/19 1623           Subjective: Feel tired, fatigued.  A little short of breath.  Objective: Vitals:   02/18/19 1500 02/18/19 2100 02/19/19 0356 02/19/19 0739  BP: 137/78 (!) 148/74 (!) 166/86 (!) 156/78  Pulse: 70 71 62 (!) 59  Resp: 13  18 20   Temp: 97.9 F (36.6 C) 98.3 F (36.8 C) 98.1 F (36.7 C) 98.1 F (36.7 C)  TempSrc: Oral Oral Oral Oral  SpO2: 99% 99% 96% 96%  Weight:      Height:        Intake/Output Summary (Last 24 hours) at 02/19/2019 1348 Last data filed at 02/19/2019 1335 Gross per 24 hour  Intake 3014.59 ml  Output -  Net 3014.59 ml   Filed Weights   02/16/19 1521  Weight: 67.7 kg  Examination:  General exam: Appears calm and comfortable  Respiratory system: unlabored, no increased WOB Cardiovascular system: RRR Gastrointestinal system: Abdomen is nondistended, soft and nontender.  Central nervous system: Alert and oriented. No focal neurological deficits. Extremities: no LEE Skin: No rashes, lesions or ulcers Psychiatry: Judgement and insight appear normal. Mood & affect appropriate.     Data Reviewed: I have personally reviewed following labs and imaging studies  CBC: Recent  Labs  Lab 02/16/19 1733 02/17/19 1210 02/18/19 0350 02/19/19 0540  WBC 17.4* 13.0* 9.3 16.3*  NEUTROABS 13.4*  --  7.5 14.3*  HGB 13.2 10.0* 9.0* 8.4*  HCT 39.3 29.1* 26.9* 25.4*  MCV 94.2 94.8 95.1 96.6  PLT 275 184 194 654   Basic Metabolic Panel: Recent Labs  Lab 02/16/19 1733 02/17/19 1210 02/18/19 0350 02/19/19 0540  NA 136 137 138 142  K 3.4* 2.9* 3.5 3.6  CL 96* 103 106 110  CO2 21* 21* 18* 17*  GLUCOSE 144* 92 199* 161*  BUN 34* 36* 36* 29*  CREATININE 1.82* 1.73* 1.67* 1.22*  CALCIUM 9.5 7.8* 7.6* 7.4*  MG  --   --   --  1.4*   GFR: Estimated Creatinine Clearance: 44.8 mL/min (A) (by C-G formula based on SCr of 1.22 mg/dL (H)). Liver Function Tests: Recent Labs  Lab 02/16/19 1733 02/17/19 1210 02/18/19 0350 02/19/19 0540  AST 18 17 16 20   ALT 9 9 10 12   ALKPHOS 67 50 47 45  BILITOT 1.2 1.0 0.5 0.3  PROT 8.5* 6.2* 6.0* 5.9*  ALBUMIN 3.9 3.0* 2.8* 2.7*   No results for input(s): LIPASE, AMYLASE in the last 168 hours. No results for input(s): AMMONIA in the last 168 hours. Coagulation Profile: No results for input(s): INR, PROTIME in the last 168 hours. Cardiac Enzymes: No results for input(s): CKTOTAL, CKMB, CKMBINDEX, TROPONINI in the last 168 hours. BNP (last 3 results) No results for input(s): PROBNP in the last 8760 hours. HbA1C: No results for input(s): HGBA1C in the last 72 hours. CBG: No results for input(s): GLUCAP in the last 168 hours. Lipid Profile: No results for input(s): CHOL, HDL, LDLCALC, TRIG, CHOLHDL, LDLDIRECT in the last 72 hours. Thyroid Function Tests: No results for input(s): TSH, T4TOTAL, FREET4, T3FREE, THYROIDAB in the last 72 hours. Anemia Panel: No results for input(s): VITAMINB12, FOLATE, FERRITIN, TIBC, IRON, RETICCTPCT in the last 72 hours. Sepsis Labs: Recent Labs  Lab 02/16/19 1821  LATICACIDVEN 1.7    Recent Results (from the past 240 hour(s))  Novel Coronavirus, NAA ([facility])     Status: Abnormal    Collection Time: 02/11/19 12:00 AM   Specimen: Oropharyngeal(OP) collection in vial transport medium   OROPHARYNGEA  TESTING  Result Value Ref Range Status   SARS-CoV-2, NAA Detected (A) Not Detected Final    Comment: This nucleic acid amplification test was developed and its performance characteristics determined by [employer]. Nucleic acid amplification tests include PCR and TMA. This test has not been FDA cleared or approved. This test has been authorized by FDA under an Emergency Use Authorization (EUA). This test is only authorized for the duration of time the declaration that circumstances exist justifying the authorization of the emergency use of in vitro diagnostic tests for detection of SARS-CoV-2 virus and/or diagnosis of COVID-19 infection under section 564(b)(1) of the Act, 21 U.S.C. 650PTW-6(F) (1), unless the authorization is terminated or revoked sooner. When diagnostic testing is negative, the possibility of a false negative result should be considered in the context  of a patient's recent exposures and the presence of clinical signs and symptoms consistent with COVID-19. An individual without symptoms of COVID-19 and who is not shedding SARS-CoV-2 virus would  expect to have a negative (not detected) result in this assay.   Culture, blood (routine x 2)     Status: None (Preliminary result)   Collection Time: 02/16/19  5:59 PM   Specimen: BLOOD  Result Value Ref Range Status   Specimen Description   Final    BLOOD LEFT ANTECUBITAL Performed at [facility], [address] [place], [name] [address]    Special Requests   Final    BOTTLES DRAWN AEROBIC AND ANAEROBIC Blood Culture results may not be optimal due to an inadequate volume of blood received in culture bottles Performed at [facility], [address] [place], [name] [address]    Culture   Final    NO GROWTH 3 DAYS Performed at [facility], [place] [address],  [place], [place] [address]    Report Status PENDING  Incomplete  Culture, blood (routine x 2)     Status: None (Preliminary result)   Collection Time: 02/16/19  6:21 PM   Specimen: BLOOD  Result Value Ref Range Status   Specimen Description   Final    BLOOD RIGHT HAND Performed at [facility], [address] [place], [name] [address]    Special Requests   Final    BOTTLES DRAWN AEROBIC ONLY Blood Culture results may not be optimal due to an inadequate volume of blood received in culture bottles Performed at [facility], [address] [place], [name] [address]    Culture   Final    NO GROWTH 3 DAYS Performed at [facility], [place] [address], [place], [place] [address]    Report Status PENDING  Incomplete  Culture, Urine     Status: Abnormal   Collection Time: 02/17/19  9:27 AM   Specimen: Urine, Clean Catch  Result Value Ref Range Status   Specimen Description   Final    URINE, CLEAN CATCH Performed at [facility], [place] [address] [place], [place] [address]    Special Requests   Final    NONE Performed at [facility], [place] [address] [place], [place] [address]    Culture (A)  Final    >=100,000 COLONIES/mL ESCHERICHIA COLI 50,000 COLONIES/mL PROTEUS MIRABILIS    Report Status 02/19/2019 FINAL  Final   Organism ID, Bacteria ESCHERICHIA COLI (A)  Final   Organism ID, Bacteria PROTEUS MIRABILIS (A)  Final      Susceptibility   Escherichia coli - MIC*    AMPICILLIN <=2 SENSITIVE Sensitive     CEFAZOLIN <=4 SENSITIVE Sensitive     CEFTRIAXONE <=1 SENSITIVE Sensitive     CIPROFLOXACIN <=0.25 SENSITIVE Sensitive     GENTAMICIN <=1 SENSITIVE Sensitive     IMIPENEM <=0.25 SENSITIVE Sensitive     NITROFURANTOIN <=16 SENSITIVE Sensitive     TRIMETH/SULFA <=20 SENSITIVE Sensitive     AMPICILLIN/SULBACTAM <=2 SENSITIVE Sensitive     PIP/TAZO <=4 SENSITIVE Sensitive     Extended ESBL NEGATIVE Sensitive     *  >=100,000 COLONIES/mL ESCHERICHIA COLI   Proteus mirabilis - MIC*    AMPICILLIN <=2 SENSITIVE Sensitive     CEFAZOLIN <=4 SENSITIVE Sensitive     CEFTRIAXONE <=1 SENSITIVE Sensitive     CIPROFLOXACIN <=0.25 SENSITIVE Sensitive  GENTAMICIN <=1 SENSITIVE Sensitive     IMIPENEM 4 SENSITIVE Sensitive     NITROFURANTOIN 128 RESISTANT Resistant     TRIMETH/SULFA <=20 SENSITIVE Sensitive     AMPICILLIN/SULBACTAM <=2 SENSITIVE Sensitive     PIP/TAZO <=4 SENSITIVE Sensitive     * 50,000 COLONIES/mL PROTEUS MIRABILIS         Radiology Studies: No results found.      Scheduled Meds: . desmopressin  0.3 mg Oral QHS  . heparin  5,000 Units Subcutaneous Q8H  . Mesalamine  800 mg Oral BID  . methylPREDNISolone (SOLU-MEDROL) injection  0.5 mg/kg Intravenous Q12H  . mometasone-formoterol  2 puff Inhalation BID  . mycophenolate  1,000 mg Oral BID  . pantoprazole  40 mg Oral Daily  . pyridostigmine  30 mg Oral Q8H  . rOPINIRole  1 mg Oral QHS  . rosuvastatin  40 mg Oral Daily   Continuous Infusions: . cefTRIAXone (ROCEPHIN)  IV Stopped (02/18/19 1859)     LOS: 3 days    Time spent: over 30 min    Fayrene Helper, MD [facility] Pager [email or web address]  If 7PM-7AM, please contact night-coverage [email or web address] Password [email or web address] 02/19/2019, 1:48 PM

## 2019-02-19 NOTE — Evaluation (Signed)
Physical Therapy Evaluation Patient Details Name: Teresa Lee MRN: [id number] DOB: 02/09/1961 Today's Date: 02/19/2019   History of Present Illness  Teresa Lee is a 58 y.o. female with medical history significant of myasthenia gravis, fibromyalgia, restless leg syndrome, Crohn's disease, hypertension, hyperlipidemia, presents to the hospital with chief complaint of weakness, poor p.o. intake, fatigue and generalized malaise.  She has been detected positive for coronavirus on 11/24 and has been trying to manage her symptoms at home.  She also been having nausea, vomiting and diarrhea  Clinical Impression  The patient presents with deconditioning. Patient will need to be mod I for Dc home. Patient should progress. Pt admitted with above diagnosis.  Pt currently with functional limitations due to the deficits listed below (see PT Problem List). Pt will benefit from skilled PT to increase their independence and safety with mobility to allow discharge to the venue listed below.   Patient 965 on RA for ambulation, gait is unsteady.     Follow Up Recommendations Home health PT    Equipment Recommendations  None recommended by PT    Recommendations for Other Services       Precautions / Restrictions Precautions Precautions: Fall Precaution Comments: very shakey      Mobility  Bed Mobility Overal bed mobility: Independent                Transfers Overall transfer level: Needs assistance Equipment used: 2 person hand held assist Transfers: Sit to/from Stand Sit to Stand: Min assist         General transfer comment: steady assist to rise  Ambulation/Gait Ambulation/Gait assistance: Mod assist   Assistive device: 2 person hand held assist Gait Pattern/deviations: Step-through pattern;Staggering right;Staggering left Gait velocity: decr   General Gait Details: gait unsteady,may improve with RW.  Stairs            Wheelchair Mobility    Modified  Rank[place] (Stroke Patients Only)       Balance Overall balance assessment: Needs assistance Sitting-balance support: Feet supported;No upper extremity supported Sitting balance-Leahy Scale: Good     Standing balance support: During functional activity;Bilateral upper extremity supported Standing balance-Leahy Scale: Poor                               Pertinent Vitals/Pain      Home Living Family/patient expects to be discharged to:: Private residence Living Arrangements: Spouse/significant other Available Help at Discharge: Family Type of Home: House Home Access: Stairs to enter Entrance Stairs-Rails: Can reach both Entrance Stairs-Number of Steps: 3 Home Layout: Two level;Able to live on main level with bedroom/bathroom Home Equipment: Walker - 2 wheels Additional Comments: spouse is sick w/ covid, pt. wants to go  to second level to recover    Prior Function Level of Independence: Independent with assistive device(s)         Comments: recently using RW     Hand Dominance   Dominant Hand: Right    Extremity/Trunk Assessment   Upper Extremity Assessment Upper Extremity Assessment: Generalized weakness    Lower Extremity Assessment Lower Extremity Assessment: Generalized weakness    Cervical / Trunk Assessment Cervical / Trunk Assessment: Normal  Communication   Communication: No difficulties  Cognition Arousal/Alertness: Awake/alert Behavior During Therapy: WFL for tasks assessed/performed;Anxious Overall Cognitive Status: Within Functional Limits for tasks assessed  General Comments      Exercises     Assessment/Plan    PT Assessment Patient needs continued PT services  PT Problem List Decreased strength;Decreased mobility;Decreased safety awareness;Decreased knowledge of precautions;Decreased activity tolerance;Decreased balance       PT Treatment Interventions DME  instruction;Therapeutic activities;Gait training;Therapeutic exercise;Functional mobility training;Patient/family education    PT Goals (Current goals can be found in the Care Plan section)  Acute Rehab PT Goals Patient Stated Goal: go home well PT Goal Formulation: With patient Time For Goal Achievement: 03/05/19 Potential to Achieve Goals: Good    Frequency Min 3X/week   Barriers to discharge Decreased caregiver support spouise is ill, mother is at risk    Co-evaluation               AM-PAC PT "6 Clicks" Mobility  Outcome Measure Help needed turning from your back to your side while in a flat bed without using bedrails?: None Help needed moving from lying on your back to sitting on the side of a flat bed without using bedrails?: None Help needed moving to and from a bed to a chair (including a wheelchair)?: A Little Help needed standing up from a chair using your arms (e.g., wheelchair or bedside chair)?: A Little Help needed to walk in hospital room?: A Little Help needed climbing 3-5 steps with a railing? : A Lot 6 Click Score: 19    End of Session   Activity Tolerance: Patient limited by fatigue Patient left: in chair;with call bell/phone within reach Nurse Communication: Mobility status PT Visit Diagnosis: Unsteadiness on feet (R26.81)    Time: 1400-1430 PT Time Calculation (min) (ACUTE ONLY): 30 min   Charges:   PT Evaluation $PT Eval Low Complexity: 1 Low PT Treatments $Gait Training: 8-22 mins        [place] Pager [phone] Office [phone]   Claretha Cooper 02/19/2019, 5:43 PM

## 2019-02-19 NOTE — Progress Notes (Signed)
Rt unavailable until after midnight and NIF was not performed for PM on 02/18/2019.  RT will relay in report to oncoming shift.

## 2019-02-19 NOTE — Progress Notes (Signed)
Pharmacy Antibiotic Note  Teresa Lee is a 58 y.o. female admitted on 02/16/2019 with COVID-19.  Pharmacy has been consulted for cefazolin dosing for UTI, currently on Ceftriaxone.  Plan: D/c ceftriaxone Cefazolin 1g IV q8h Consider changing to Keflex when able to tolerate PO.    Height: 5\' 1"  (154.9 cm) Weight: 149 lb 4 oz (67.7 kg) IBW/kg (Calculated) : 47.8  Temp (24hrs), Avg:98.1 F (36.7 C), Min:97.9 F (36.6 C), Max:98.3 F (36.8 C)  Recent Labs  Lab 02/16/19 1733 02/16/19 1821 02/17/19 1210 02/18/19 0350 02/19/19 0540  WBC 17.4*  --  13.0* 9.3 16.3*  CREATININE 1.82*  --  1.73* 1.67* 1.22*  LATICACIDVEN  --  1.7  --   --   --     Estimated Creatinine Clearance: 44.8 mL/min (A) (by C-G formula based on SCr of 1.22 mg/dL (H)).    Allergies  Allergen Reactions  . Latex Other (See Comments)    BLISTER  . Lorazepam     Other reaction(s): Hallucinations  . Percocet [Oxycodone-Acetaminophen] Hives  . Sulfa Antibiotics Hives  . Sulfamethoxazole-Trimethoprim Hives  . Tapentadol Hcl Other (See Comments)    HEADACHES NUCENTA  . Nucynta [Tapentadol]     Headache   . Orange Concentrate [Flavoring Agent]     Acid reflux  . Other Other (See Comments), Hives and Nausea Only    Some nuts cause a rash  . Tomato     Burns skin  . Bioflavonoids Rash    Causes burning of skin and blisters  . Cefixime     Due to myasthenia gravis    Antimicrobials this admission: 12/1 Ceftriaxone >>12/2 12/2 Cefazolin >>   Dose adjustments this admission:   Microbiology results: 11/30 UCx: >100k Ecoli (pan-sens) and 50k Proteus (pan-sens except NTF) 11/29 BCx: ngtd x3d  Thank you for allowing pharmacy to be a part of this patient's care.  Leeroy Bock 02/19/2019 2:02 PM

## 2019-02-19 NOTE — Plan of Care (Signed)
  Problem: Education: Goal: Knowledge of risk factors and measures for prevention of condition will improve Outcome: Progressing   Problem: Coping: Goal: Psychosocial and spiritual needs will be supported Outcome: Progressing   Problem: Respiratory: Goal: Will maintain a patent airway Outcome: Progressing Goal: Complications related to the disease process, condition or treatment will be avoided or minimized Outcome: Progressing   

## 2019-02-19 NOTE — Plan of Care (Signed)
Discussed plan of care with patient for the evening, pain management and standby mobility with some teach back displayed.  Patient updating his own family tonight.

## 2019-02-20 ENCOUNTER — Inpatient Hospital Stay (HOSPITAL_COMMUNITY): Payer: Medicare HMO

## 2019-02-20 LAB — CBC WITH DIFFERENTIAL/PLATELET
Abs Immature Granulocytes: 2.5 10*3/uL — ABNORMAL HIGH (ref 0.00–0.07)
Band Neutrophils: 4 %
Basophils Absolute: 0 10*3/uL (ref 0.0–0.1)
Basophils Relative: 0 %
Eosinophils Absolute: 0 10*3/uL (ref 0.0–0.5)
Eosinophils Relative: 0 %
HCT: 26.5 % — ABNORMAL LOW (ref 36.0–46.0)
Hemoglobin: 8.8 g/dL — ABNORMAL LOW (ref 12.0–15.0)
Lymphocytes Relative: 3 %
Lymphs Abs: 0.6 10*3/uL — ABNORMAL LOW (ref 0.7–4.0)
MCH: 32.1 pg (ref 26.0–34.0)
MCHC: 33.2 g/dL (ref 30.0–36.0)
MCV: 96.7 fL (ref 80.0–100.0)
Metamyelocytes Relative: 2 %
Monocytes Absolute: 0.4 10*3/uL (ref 0.1–1.0)
Monocytes Relative: 2 %
Myelocytes: 10 %
Neutro Abs: 17.4 10*3/uL — ABNORMAL HIGH (ref 1.7–7.7)
Neutrophils Relative %: 79 %
Platelets: 224 10*3/uL (ref 150–400)
RBC: 2.74 MIL/uL — ABNORMAL LOW (ref 3.87–5.11)
RDW: 13.2 % (ref 11.5–15.5)
WBC: 21 10*3/uL — ABNORMAL HIGH (ref 4.0–10.5)
nRBC: 0.1 % (ref 0.0–0.2)

## 2019-02-20 LAB — C-REACTIVE PROTEIN: CRP: 1.7 mg/dL — ABNORMAL HIGH (ref <1.0)

## 2019-02-20 LAB — BRAIN NATRIURETIC PEPTIDE: B Natriuretic Peptide: 270.6 pg/mL — ABNORMAL HIGH (ref 0.0–100.0)

## 2019-02-20 LAB — COMPREHENSIVE METABOLIC PANEL
ALT: 22 U/L (ref 0–44)
AST: 34 U/L (ref 15–41)
Albumin: 3 g/dL — ABNORMAL LOW (ref 3.5–5.0)
Alkaline Phosphatase: 56 U/L (ref 38–126)
Anion gap: 15 (ref 5–15)
BUN: 29 mg/dL — ABNORMAL HIGH (ref 6–20)
CO2: 17 mmol/L — ABNORMAL LOW (ref 22–32)
Calcium: 7.4 mg/dL — ABNORMAL LOW (ref 8.9–10.3)
Chloride: 107 mmol/L (ref 98–111)
Creatinine, Ser: 1.14 mg/dL — ABNORMAL HIGH (ref 0.44–1.00)
GFR calc Af Amer: >60 mL/min (ref >60)
GFR calc non Af Amer: 53 mL/min — ABNORMAL LOW (ref >60)
Glucose, Bld: 148 mg/dL — ABNORMAL HIGH (ref 70–99)
Potassium: 3.6 mmol/L (ref 3.5–5.1)
Sodium: 139 mmol/L (ref 135–145)
Total Bilirubin: 0.6 mg/dL (ref 0.3–1.2)
Total Protein: 6.2 g/dL — ABNORMAL LOW (ref 6.5–8.1)

## 2019-02-20 LAB — MAGNESIUM: Magnesium: 1.4 mg/dL — ABNORMAL LOW (ref 1.7–2.4)

## 2019-02-20 LAB — D-DIMER, QUANTITATIVE: D-Dimer, Quant: 0.81 ug/mL-FEU — ABNORMAL HIGH (ref 0.00–0.50)

## 2019-02-20 LAB — FERRITIN: Ferritin: 605 ng/mL — ABNORMAL HIGH (ref 11–307)

## 2019-02-20 MED ORDER — FUROSEMIDE 10 MG/ML IJ SOLN
20.0000 mg | Freq: Once | INTRAMUSCULAR | Status: AC
  Administered 2019-02-20: 20 mg via INTRAVENOUS
  Filled 2019-02-20: qty 2

## 2019-02-20 NOTE — Progress Notes (Signed)
Pt did greater than negative 50 on NIF

## 2019-02-20 NOTE — Plan of Care (Signed)
Pt slept well during the night. Complained of chronic hip pain, prn Norco given. Alert and oriented. Vitals stable on RA. Minimal dyspnea on exertion. Independent with ADLs.  IV  SL. Able to independently  reposition regularly for pressure relief. Tolerated prone positioning for 2hrs overnight. No other issues, will monitor.   Problem: Education: Goal: Knowledge of risk factors and measures for prevention of condition will improve Outcome: Progressing   Problem: Coping: Goal: Psychosocial and spiritual needs will be supported Outcome: Progressing   Problem: Respiratory: Goal: Will maintain a patent airway Outcome: Progressing Goal: Complications related to the disease process, condition or treatment will be avoided or minimized Outcome: Progressing

## 2019-02-20 NOTE — Progress Notes (Signed)
RT NOTE:  RT unable to see patient for nighttime NIF check. Will report to dayshift RT.

## 2019-02-20 NOTE — Plan of Care (Signed)
  Problem: Education: Goal: Knowledge of risk factors and measures for prevention of condition will improve Outcome: Progressing   Problem: Coping: Goal: Psychosocial and spiritual needs will be supported Outcome: Progressing   Problem: Respiratory: Goal: Will maintain a patent airway Outcome: Progressing Goal: Complications related to the disease process, condition or treatment will be avoided or minimized Outcome: Progressing   

## 2019-02-20 NOTE — Progress Notes (Signed)
Occupational Therapy Evaluation Patient Details Name: Teresa Lee MRN: [id number] DOB: [birth year] Today's Date: 02/20/2019    History of Present Illness Paddy Panganiban is a 58 y.o. female with medical history significant of myasthenia gravis, fibromyalgia, restless leg syndrome, Crohn's disease, hypertension, hyperlipidemia, presents to the hospital with chief complaint of weakness, poor p.o. intake, fatigue and generalized malaise.  She has been detected positive for coronavirus on 11/24 and has been trying to manage her symptoms at home.  She also been having nausea, vomiting and diarrhea   Clinical Impression   PTA, pt independent with ADL and mobility. Pt states she has become weaker PTA and had begun using the RW. Pt states she feels better but expressing concerns over going home since her husband also has Covid. Began education on energy conservation. Do not anticipate need for OT follow up. Will follow acutely. Pt very appreciative.     Follow Up Recommendations  No OT follow up;Supervision - Intermittent    Equipment Recommendations  None recommended by OT    Recommendations for Other Services       Precautions / Restrictions Precautions Precautions: Fall Restrictions Weight Bearing Restrictions: No      Mobility Bed Mobility Overal bed mobility: Independent                Transfers Overall transfer level: Needs assistance   Transfers: Sit to/from Stand Sit to Stand: Min guard              Balance Overall balance assessment: Mild deficits observed, not formally tested(furniture walking)                                         ADL either performed or assessed with clinical judgement   ADL Overall ADL's : Needs assistance/impaired                                     Functional mobility during ADLs: Supervision/safety General ADL Comments: Pt overall set up for ADL tasks. Began education on energy  conservation strategies; encouraged pt to use her showerseat to bath but initially sopnge bathing until she is strong enough to shower due to her conern for passing out     Vision         Perception     Praxis      Pertinent Vitals/Pain Pain Assessment: No/denies pain     Hand Dominance Right   Extremity/Trunk Assessment Upper Extremity Assessment Upper Extremity Assessment: Generalized weakness   Lower Extremity Assessment Lower Extremity Assessment: Defer to PT evaluation       Communication Communication Communication: No difficulties   Cognition Arousal/Alertness: Awake/alert Behavior During Therapy: Anxious Overall Cognitive Status: Within Functional Limits for tasks assessed                                     General Comments  Pt expressing anxiety about going home to her "infected home". Educated pt on viral transmission and that it would be OK for her to be around her husband who also has Covid. Pt with reduced anxiety after conversation    Exercises Exercises: Other exercises Other Exercises Other Exercises: flutter valve x 10 Other Exercises: incentive spirometer x 10 - able to  pull 1500 ml   Shoulder Instructions      Home Living Family/patient expects to be discharged to:: Private residence Living Arrangements: Spouse/significant other Available Help at Discharge: Family Type of Home: House Home Access: Stairs to enter [employer] of Steps: 3 Entrance Stairs-Rails: Can reach both Home Layout: Two level;Able to live on main level with bedroom/bathroom Alternate Level Stairs-Number of Steps: 12   Bathroom Shower/Tub: Occupational psychologist: Standard Bathroom Accessibility: Yes How Accessible: Accessible via walker Home Equipment: Walker - 2 wheels;Shower seat;Hand held shower head          Prior Functioning/Environment Level of Independence: Independent        Comments: recently using RW due to  weakness from Covid        OT Problem List: Decreased strength;Decreased activity tolerance;Decreased knowledge of use of DME or AE;Cardiopulmonary status limiting activity      OT Treatment/Interventions: Self-care/ADL training;Therapeutic exercise;Neuromuscular education;Energy conservation;DME and/or AE instruction;Therapeutic activities;Patient/family education    OT Goals(Current goals can be found in the care plan section) Acute Rehab OT Goals Patient Stated Goal: go home well OT Goal Formulation: With patient Time For Goal Achievement: 03/06/19 Potential to Achieve Goals: Good  OT Frequency: Min 3X/week   Barriers to D/C:            Co-evaluation              AM-PAC OT "6 Clicks" Daily Activity     Outcome Measure Help from another person eating meals?: None Help from another person taking care of personal grooming?: None Help from another person toileting, which includes using toliet, bedpan, or urinal?: A Little Help from another person bathing (including washing, rinsing, drying)?: A Little Help from another person to put on and taking off regular upper body clothing?: None Help from another person to put on and taking off regular lower body clothing?: A Little 6 Click Score: 21   End of Session Nurse Communication: Mobility status;Other (comment)(Pt complaining of SOB with activity)  Activity Tolerance: Patient tolerated treatment well Patient left: in chair;with call bell/phone within reach  OT Visit Diagnosis: Unsteadiness on feet (R26.81);Muscle weakness (generalized) (M62.81)                Time: [id number] OT Time Calculation (min): 22 min Charges:  OT General Charges $OT Visit: 1 Visit OT Evaluation $OT Eval Moderate Complexity: [place], OT/L   Acute OT Clinical Specialist [place] Pager [phone] Office [phone]   [facility] 02/20/2019, 10:49 AM

## 2019-02-20 NOTE — Progress Notes (Addendum)
PROGRESS NOTE    Teresa Lee  YWV:[id number] DOB: December 28, 1960 DOA: 02/16/2019 PCP: [place], Betty G, MD   Brief Narrative:  Teresa Lee Teresa Lee 58 y.o.femalewith medical history significant ofmyasthenia gravis, fibromyalgia, restless leg syndrome, Crohn's disease, hypertension, hyperlipidemia, presents to the hospital with chief complaint of weakness, poor p.o. intake, fatigue and generalized malaise. She has been detected positive for coronavirus on 11/24and has been trying to manage her symptoms at home. She also been having nausea, vomiting and diarrhea. She was barely able to eat anything in the last few days.   She had no respiratory symptoms on admission  Assessment & Plan:   Active Problems:   AKI (acute kidney injury) ([place])   Acute on chronic renal failure (HCC)   COVID-19  Urinary Tract Infection  E. Coli and Proteus UTI: - question of whether this was true UTI in prior notes, but given her generalized malaise, currently treating with ceftriaxone -> will narrow to ancef while inpatient - Blood cx NGTD  Shortness of Breath: CXR 12/3 with bibasilar atelectasis and infiltrates without interim change and small bilateral effusions.  She's satting 99% on RA, but has subjective SOB.  Her BNP is elevated.  Will give dose of lasix and follow sx.  Hold off on remdesivir given no O2 requirement at this point. Follow echo as well.  COVID 19 Viral Illness:  - no resp sx at this time, negative CXR on admission - was c/o poor PO intake, N/V/D at presentation - repeat imaging as needed -> follow CXR (as noted above) - hold off on remdesivir at this point, her steroids were increased as noted below due to her being on chronic steroids with acute illness  COVID-19 Labs  Recent Labs    02/18/19 0350 02/19/19 0540 02/20/19 0318  DDIMER 0.44 0.59* 0.81*  FERRITIN  --   --  605*  CRP 2.8* 1.8* 1.7*    Lab Results  Component Value Date   SARSCOV2NAA Detected (Markey Deady)  02/11/2019   Myasthenia gravis -Monitor NIFs, stable, continue home medication with CellCept, Mestinon  -On chronic steroids, use higher dose given acute illness  Acute Kidney Injury: baseline creatinine is <1.  Improving at this time.  Hypertension: losartan restarted, follow BP  Crohn's Disease: continue mesalamine  RLS: requip  Hyperlipidemia: continue statin  GERD: continue PPI  Anxiety: continue xanax  Nocturnal Polyuria: continue DDAVP  Leukocytosis: likely 2/2 steroids  NAGMA: 2/2 NS resuscitation, continue to follow  Hypomagnesemia: replace and follow - replace orally, caution with MG  DVT prophylaxis: heparin  Code Status: full  Family Communication: none at bedside - called husband, 12/3 Disposition Plan:pending further improvement  Consultants:   none  Procedures:  none  Antimicrobials:  Anti-infectives (From admission, onward)   Start     Dose/Rate Route Frequency Ordered Stop   02/19/19 1500  ceFAZolin (ANCEF) IVPB 1 g/50 mL premix     1 g 100 mL/hr over 30 Minutes Intravenous Every 8 hours 02/19/19 1410     02/18/19 1700  cefTRIAXone (ROCEPHIN) 1 g in sodium chloride 0.9 % 100 mL IVPB  Status:  Discontinued     1 g 200 mL/hr over 30 Minutes Intravenous Every 24 hours 02/18/19 1623 02/19/19 1410         Subjective: C/o persistent SOB today  Objective: Vitals:   02/19/19 2000 02/20/19 0415 02/20/19 0822 02/20/19 0833  BP: 139/80 (!) 160/77 (!) 191/92 (!) 156/82  Pulse: 63 (!) 55 (!) 56   Resp:  20 20 18    Temp: 98.2 F (36.8 C) 98 F (36.7 C) 98.1 F (36.7 C)   TempSrc: Oral Oral Oral   SpO2: 99% 96% 99%   Weight:      Height:        Intake/Output Summary (Last 24 hours) at 02/20/2019 1538 Last data filed at 02/20/2019 0800 Gross per 24 hour  Intake 1200 ml  Output -  Net 1200 ml   Filed Weights   02/16/19 1521  Weight: 67.7 kg    Examination:  General: No acute distress. Cardiovascular: RRR Lungs: Clear to auscultation  bilaterally, unlabored Abdomen: Soft, nontender, nondistended  Neurological: Alert and oriented 3. Moves all extremities 4. Cranial nerves II through XII grossly intact. Skin: Warm and dry. No rashes or lesions. Extremities: No clubbing or cyanosis. No edema.   Data Reviewed: I have personally reviewed following labs and imaging studies  CBC: Recent Labs  Lab 02/16/19 1733 02/17/19 1210 02/18/19 0350 02/19/19 0540 02/20/19 0318  WBC 17.4* 13.0* 9.3 16.3* 21.0*  NEUTROABS 13.4*  --  7.5 14.3* 17.4*  HGB 13.2 10.0* 9.0* 8.4* 8.8*  HCT 39.3 29.1* 26.9* 25.4* 26.5*  MCV 94.2 94.8 95.1 96.6 96.7  PLT 275 184 194 208 916   Basic Metabolic Panel: Recent Labs  Lab 02/16/19 1733 02/17/19 1210 02/18/19 0350 02/19/19 0540 02/20/19 0318  NA 136 137 138 142 139  K 3.4* 2.9* 3.5 3.6 3.6  CL 96* 103 106 110 107  CO2 21* 21* 18* 17* 17*  GLUCOSE 144* 92 199* 161* 148*  BUN 34* 36* 36* 29* 29*  CREATININE 1.82* 1.73* 1.67* 1.22* 1.14*  CALCIUM 9.5 7.8* 7.6* 7.4* 7.4*  MG  --   --   --  1.4*  --    GFR: Estimated Creatinine Clearance: 48 mL/min (Almira Phetteplace) (by C-G formula based on SCr of 1.14 mg/dL (H)). Liver Function Tests: Recent Labs  Lab 02/16/19 1733 02/17/19 1210 02/18/19 0350 02/19/19 0540 02/20/19 0318  AST 18 17 16 20  34  ALT 9 9 10 12 22   ALKPHOS 67 50 47 45 56  BILITOT 1.2 1.0 0.5 0.3 0.6  PROT 8.5* 6.2* 6.0* 5.9* 6.2*  ALBUMIN 3.9 3.0* 2.8* 2.7* 3.0*   No results for input(s): LIPASE, AMYLASE in the last 168 hours. No results for input(s): AMMONIA in the last 168 hours. Coagulation Profile: No results for input(s): INR, PROTIME in the last 168 hours. Cardiac Enzymes: No results for input(s): CKTOTAL, CKMB, CKMBINDEX, TROPONINI in the last 168 hours. BNP (last 3 results) No results for input(s): PROBNP in the last 8760 hours. HbA1C: No results for input(s): HGBA1C in the last 72 hours. CBG: No results for input(s): GLUCAP in the last 168 hours. Lipid Profile:  No results for input(s): CHOL, HDL, LDLCALC, TRIG, CHOLHDL, LDLDIRECT in the last 72 hours. Thyroid Function Tests: No results for input(s): TSH, T4TOTAL, FREET4, T3FREE, THYROIDAB in the last 72 hours. Anemia Panel: Recent Labs    02/20/19 0318  FERRITIN 605*   Sepsis Labs: Recent Labs  Lab 02/16/19 1821  LATICACIDVEN 1.7    Recent Results (from the past 240 hour(s))  Novel Coronavirus, NAA ([facility])     Status: Abnormal   Collection Time: 02/11/19 12:00 AM   Specimen: Oropharyngeal(OP) collection in vial transport medium   OROPHARYNGEA  TESTING  Result Value Ref Range Status   SARS-CoV-2, NAA Detected (Shera Laubach) Not Detected Final    Comment: This nucleic acid amplification test was developed and its performance  characteristics determined by [employer]. Nucleic acid amplification tests include PCR and TMA. This test has not been FDA cleared or approved. This test has been authorized by FDA under an Emergency Use Authorization (EUA). This test is only authorized for the duration of time the declaration that circumstances exist justifying the authorization of the emergency use of in vitro diagnostic tests for detection of SARS-CoV-2 virus and/or diagnosis of COVID-19 infection under section 564(b)(1) of the Act, 21 U.S.C. 510CHE-5(I) (1), unless the authorization is terminated or revoked sooner. When diagnostic testing is negative, the possibility of Rickard Kennerly false negative result should be considered in the context of Halford Goetzke patient's recent exposures and the presence of clinical signs and symptoms consistent with COVID-19. An individual without symptoms of COVID-19 and who is not shedding SARS-CoV-2 virus would  expect to have Maressa Apollo negative (not detected) result in this assay.   Culture, blood (routine x 2)     Status: None (Preliminary result)   Collection Time: 02/16/19  5:59 PM   Specimen: BLOOD  Result Value Ref Range Status   Specimen Description   Final    BLOOD LEFT  ANTECUBITAL Performed at [facility], [address] [place], [name] [address]    Special Requests   Final    BOTTLES DRAWN AEROBIC AND ANAEROBIC Blood Culture results may not be optimal due to an inadequate volume of blood received in culture bottles Performed at [facility], [address] [place], [name] [address]    Culture   Final    NO GROWTH 4 DAYS Performed at [facility], [place] [address] [place], [place] [address]    Report Status PENDING  Incomplete  Culture, blood (routine x 2)     Status: None (Preliminary result)   Collection Time: 02/16/19  6:21 PM   Specimen: BLOOD  Result Value Ref Range Status   Specimen Description   Final    BLOOD RIGHT HAND Performed at [facility], [address] [place], [name] [address]    Special Requests   Final    BOTTLES DRAWN AEROBIC ONLY Blood Culture results may not be optimal due to an inadequate volume of blood received in culture bottles Performed at [facility] [address] [place], [name] [address]    Culture   Final    NO GROWTH 4 DAYS Performed at [facility], [place] [address] [place], [place] [address]    Report Status PENDING  Incomplete  Culture, Urine     Status: Abnormal   Collection Time: 02/17/19  9:27 AM   Specimen: Urine, Clean Catch  Result Value Ref Range Status   Specimen Description   Final    URINE, CLEAN CATCH Performed at [facility], [place] [address], [place], [place] [address]    Special Requests   Final    NONE Performed at [facility], [place] [address], [place], [place] [address]    Culture (Ryland Smoots)  Final    >=100,000 COLONIES/mL ESCHERICHIA COLI 50,000 COLONIES/mL PROTEUS MIRABILIS    Report Status 02/19/2019 FINAL  Final   Organism ID, Bacteria ESCHERICHIA COLI (Jahnavi Muratore)  Final   Organism ID, Bacteria PROTEUS MIRABILIS (Huston Stonehocker)  Final      Susceptibility   Escherichia coli - MIC*    AMPICILLIN <=2  SENSITIVE Sensitive     CEFAZOLIN <=4 SENSITIVE Sensitive     CEFTRIAXONE <=1 SENSITIVE Sensitive  CIPROFLOXACIN <=0.25 SENSITIVE Sensitive     GENTAMICIN <=1 SENSITIVE Sensitive     IMIPENEM <=0.25 SENSITIVE Sensitive     NITROFURANTOIN <=16 SENSITIVE Sensitive     TRIMETH/SULFA <=20 SENSITIVE Sensitive     AMPICILLIN/SULBACTAM <=2 SENSITIVE Sensitive     PIP/TAZO <=4 SENSITIVE Sensitive     Extended ESBL NEGATIVE Sensitive     * >=100,000 COLONIES/mL ESCHERICHIA COLI   Proteus mirabilis - MIC*    AMPICILLIN <=2 SENSITIVE Sensitive     CEFAZOLIN <=4 SENSITIVE Sensitive     CEFTRIAXONE <=1 SENSITIVE Sensitive     CIPROFLOXACIN <=0.25 SENSITIVE Sensitive     GENTAMICIN <=1 SENSITIVE Sensitive     IMIPENEM 4 SENSITIVE Sensitive     NITROFURANTOIN 128 RESISTANT Resistant     TRIMETH/SULFA <=20 SENSITIVE Sensitive     AMPICILLIN/SULBACTAM <=2 SENSITIVE Sensitive     PIP/TAZO <=4 SENSITIVE Sensitive     * 50,000 COLONIES/mL PROTEUS MIRABILIS         Radiology Studies: Dg Chest Port 1 View  Result Date: 02/20/2019 CLINICAL DATA:  Shortness of breath. EXAM: PORTABLE CHEST 1 VIEW COMPARISON:  02/19/2019.  CT 09/06/2017. FINDINGS: Prior median sternotomy. Fracture of the lower sternotomy wire again noted. Heart size normal. Bibasilar atelectasis and infiltrates again noted without interim change. Small bilateral pleural effusions again noted without interim change. No pneumothorax. No acute bony abnormality. Neurostimulator wires noted over the lower thoracic spine in stable position. IMPRESSION: Persistent bibasilar atelectasis and infiltrates again noted without interim change. Small bilateral pleural effusions again noted without interim change. Electronically Signed   By: Marcello Moores  Register   On: 02/20/2019 09:14   Dg Chest Port 1 View  Result Date: 02/19/2019 CLINICAL DATA:  Shortness of breath. EXAM: PORTABLE CHEST 1 VIEW COMPARISON:  Single-view of the chest 02/16/2019. PA and  lateral chest 09/06/2018. CT chest, abdomen and pelvis 09/05/2017. FINDINGS: Small left pleural effusion and basilar airspace disease are new. There is Yurani Fettes small amount of fluid in the minor fissure versus right midlung atelectasis. Heart size is normal. The patient is status post median sternotomy. Thoracic stimulator noted. IMPRESSION: Small left pleural effusion and basilar airspace disease which could be due to atelectasis or pneumonia. Electronically Signed   By: Inge Rise M.D.   On: 02/19/2019 14:51        Scheduled Meds: . desmopressin  0.3 mg Oral QHS  . furosemide  20 mg Intravenous Once  . heparin  5,000 Units Subcutaneous Q8H  . losartan  50 mg Oral Daily  . magnesium oxide  400 mg Oral BID  . Mesalamine  800 mg Oral BID  . methylPREDNISolone (SOLU-MEDROL) injection  0.5 mg/kg Intravenous Q12H  . mometasone-formoterol  2 puff Inhalation BID  . mycophenolate  1,000 mg Oral BID  . pantoprazole  40 mg Oral Daily  . pyridostigmine  30 mg Oral Q8H  . rOPINIRole  1 mg Oral QHS  . rosuvastatin  40 mg Oral Daily   Continuous Infusions: .  ceFAZolin (ANCEF) IV 1 g (02/20/19 1446)     LOS: 4 days    Time spent: over 92 min    Fayrene Helper, MD [facility] Pager AMION  If 7PM-7AM, please contact night-coverage [email or web address] Password [facility] 02/20/2019, 3:38 PM

## 2019-02-21 ENCOUNTER — Inpatient Hospital Stay (HOSPITAL_COMMUNITY): Payer: Medicare HMO

## 2019-02-21 DIAGNOSIS — I351 Nonrheumatic aortic (valve) insufficiency: Secondary | ICD-10-CM

## 2019-02-21 DIAGNOSIS — I340 Nonrheumatic mitral (valve) insufficiency: Secondary | ICD-10-CM

## 2019-02-21 LAB — ECHOCARDIOGRAM COMPLETE
Height: 61 in
Weight: 2388.02 oz

## 2019-02-21 LAB — CULTURE, BLOOD (ROUTINE X 2)
Culture: NO GROWTH
Culture: NO GROWTH

## 2019-02-21 LAB — CBC WITH DIFFERENTIAL/PLATELET
Abs Immature Granulocytes: 3.06 10*3/uL — ABNORMAL HIGH (ref 0.00–0.07)
Basophils Absolute: 0 10*3/uL (ref 0.0–0.1)
Basophils Relative: 0 %
Eosinophils Absolute: 0 10*3/uL (ref 0.0–0.5)
Eosinophils Relative: 0 %
HCT: 26.8 % — ABNORMAL LOW (ref 36.0–46.0)
Hemoglobin: 9.4 g/dL — ABNORMAL LOW (ref 12.0–15.0)
Immature Granulocytes: 15 %
Lymphocytes Relative: 7 %
Lymphs Abs: 1.4 10*3/uL (ref 0.7–4.0)
MCH: 32.6 pg (ref 26.0–34.0)
MCHC: 35.1 g/dL (ref 30.0–36.0)
MCV: 93.1 fL (ref 80.0–100.0)
Monocytes Absolute: 1.1 10*3/uL — ABNORMAL HIGH (ref 0.1–1.0)
Monocytes Relative: 6 %
Neutro Abs: 14.7 10*3/uL — ABNORMAL HIGH (ref 1.7–7.7)
Neutrophils Relative %: 72 %
Platelets: 258 10*3/uL (ref 150–400)
RBC: 2.88 MIL/uL — ABNORMAL LOW (ref 3.87–5.11)
RDW: 12.7 % (ref 11.5–15.5)
WBC: 20.4 10*3/uL — ABNORMAL HIGH (ref 4.0–10.5)
nRBC: 0.6 % — ABNORMAL HIGH (ref 0.0–0.2)

## 2019-02-21 LAB — COMPREHENSIVE METABOLIC PANEL
ALT: 25 U/L (ref 0–44)
AST: 28 U/L (ref 15–41)
Albumin: 3 g/dL — ABNORMAL LOW (ref 3.5–5.0)
Alkaline Phosphatase: 64 U/L (ref 38–126)
Anion gap: 19 — ABNORMAL HIGH (ref 5–15)
BUN: 35 mg/dL — ABNORMAL HIGH (ref 6–20)
CO2: 21 mmol/L — ABNORMAL LOW (ref 22–32)
Calcium: 7.7 mg/dL — ABNORMAL LOW (ref 8.9–10.3)
Chloride: 99 mmol/L (ref 98–111)
Creatinine, Ser: 1.14 mg/dL — ABNORMAL HIGH (ref 0.44–1.00)
GFR calc Af Amer: >60 mL/min (ref >60)
GFR calc non Af Amer: 53 mL/min — ABNORMAL LOW (ref >60)
Glucose, Bld: 165 mg/dL — ABNORMAL HIGH (ref 70–99)
Potassium: 3.2 mmol/L — ABNORMAL LOW (ref 3.5–5.1)
Sodium: 139 mmol/L (ref 135–145)
Total Bilirubin: 0.8 mg/dL (ref 0.3–1.2)
Total Protein: 6.4 g/dL — ABNORMAL LOW (ref 6.5–8.1)

## 2019-02-21 LAB — MAGNESIUM: Magnesium: 1.5 mg/dL — ABNORMAL LOW (ref 1.7–2.4)

## 2019-02-21 LAB — BRAIN NATRIURETIC PEPTIDE: B Natriuretic Peptide: 406.7 pg/mL — ABNORMAL HIGH (ref 0.0–100.0)

## 2019-02-21 LAB — FERRITIN: Ferritin: 557 ng/mL — ABNORMAL HIGH (ref 11–307)

## 2019-02-21 LAB — C-REACTIVE PROTEIN: CRP: 3.7 mg/dL — ABNORMAL HIGH (ref <1.0)

## 2019-02-21 LAB — D-DIMER, QUANTITATIVE: D-Dimer, Quant: 0.89 ug/mL-FEU — ABNORMAL HIGH (ref 0.00–0.50)

## 2019-02-21 MED ORDER — FUROSEMIDE 10 MG/ML IJ SOLN
20.0000 mg | Freq: Four times a day (QID) | INTRAMUSCULAR | Status: AC
  Administered 2019-02-21 (×2): 20 mg via INTRAVENOUS
  Filled 2019-02-21 (×2): qty 2

## 2019-02-21 MED ORDER — IOHEXOL 350 MG/ML SOLN
75.0000 mL | Freq: Once | INTRAVENOUS | Status: AC | PRN
  Administered 2019-02-21: 75 mL via INTRAVENOUS

## 2019-02-21 MED ORDER — SODIUM CHLORIDE 0.9 % IV SOLN
100.0000 mg | Freq: Every day | INTRAVENOUS | Status: AC
  Administered 2019-02-22 – 2019-02-25 (×4): 100 mg via INTRAVENOUS
  Filled 2019-02-21 (×4): qty 100

## 2019-02-21 MED ORDER — SODIUM CHLORIDE 0.9 % IV SOLN
200.0000 mg | Freq: Once | INTRAVENOUS | Status: AC
  Administered 2019-02-21: 200 mg via INTRAVENOUS
  Filled 2019-02-21: qty 40

## 2019-02-21 MED ORDER — SODIUM CHLORIDE 0.9% FLUSH: 10.0000 mL | INTRAVENOUS | Status: DC | PRN

## 2019-02-21 MED ORDER — POTASSIUM CHLORIDE CRYS ER 20 MEQ PO TBCR
40.0000 meq | EXTENDED_RELEASE_TABLET | ORAL | Status: AC
  Administered 2019-02-21 (×2): 40 meq via ORAL
  Filled 2019-02-21 (×2): qty 2

## 2019-02-21 MED ORDER — MAGNESIUM OXIDE 400 (241.3 MG) MG PO TABS
400.0000 mg | ORAL_TABLET | Freq: Two times a day (BID) | ORAL | Status: AC
  Administered 2019-02-21 – 2019-02-22 (×4): 400 mg via ORAL
  Filled 2019-02-21 (×4): qty 1

## 2019-02-21 NOTE — Progress Notes (Signed)
NIF= >-40cmH2o X 3 with great effort.

## 2019-02-21 NOTE — Progress Notes (Signed)
  Echocardiogram 2D Echocardiogram has been performed.  Darlina Sicilian M 02/21/2019, 8:31 AM

## 2019-02-21 NOTE — Plan of Care (Signed)
  Problem: Education: Goal: Knowledge of risk factors and measures for prevention of condition will improve Outcome: Progressing   Problem: Coping: Goal: Psychosocial and spiritual needs will be supported Outcome: Progressing   Problem: Respiratory: Goal: Will maintain a patent airway Outcome: Progressing Goal: Complications related to the disease process, condition or treatment will be avoided or minimized Outcome: Progressing   

## 2019-02-21 NOTE — Progress Notes (Addendum)
PROGRESS NOTE    Teresa Lee  [id number] DOB: February 26, 1961 DOA: 02/16/2019 PCP: [place], Betty G, MD   Brief Narrative:  Teresa Lee Teresa Lee 58 y.o.femalewith medical history significant ofmyasthenia gravis, fibromyalgia, restless leg syndrome, Crohn's disease, hypertension, hyperlipidemia, presents to the hospital with chief complaint of weakness, poor p.o. intake, fatigue and generalized malaise. She has been detected positive for coronavirus on 11/24and has been trying to manage her symptoms at home. She also been having nausea, vomiting and diarrhea. She was barely able to eat anything in the last few days.   She had no respiratory symptoms on admission  Assessment & Plan:   Active Problems:   AKI (acute kidney injury) ([place])   Acute on chronic renal failure (HCC)   COVID-19  Urinary Tract Infection  E. Coli and Proteus UTI: - question of whether this was true UTI in prior notes, but given her generalized malaise, currently treating with ceftriaxone -> will narrow to ancef while inpatient - Blood cx NGTD  Shortness of Breath: CXR 12/3 with bibasilar atelectasis and infiltrates without interim change and small bilateral effusions.  She's satting 99% on RA, but has subjective SOB.  Her BNP is elevated.  Will give dose of lasix and follow sx.  Hold off on remdesivir given no O2 requirement at this point. CXR today with streaky opacities overlying mid to lower lungs - scarring vs atelectasis vs pneumonia Echo with normal EF (see report) Will follow CTA chest given persistent SOB Continue diuresis as she notes slight improvement with this  COVID 19 Viral Illness:  - negative CXR on admission - was c/o poor PO intake, N/V/D at presentation - she has SOB now as noted above, though O2 sats consistently above 96% on RA - follow CTA as noted above - hold off on remdesivir at this point, her steroids were increased as noted below due to her being on chronic steroids with  acute illness  COVID-19 Labs  Recent Labs    02/19/19 0540 02/20/19 0318 02/21/19 0330  DDIMER 0.59* 0.81* 0.89*  FERRITIN  --  605* 557*  CRP 1.8* 1.7* 3.7*    Lab Results  Component Value Date   SARSCOV2NAA Detected (Teresa Lee) 02/11/2019   Myasthenia gravis -Monitor NIFs, stable, continue home medication with CellCept, Mestinon  -On chronic steroids, use higher dose given acute illness  Acute Kidney Injury: baseline creatinine is <1.  Improving at this time.  Hypertension: losartan restarted, follow BP  Crohn's Disease: continue mesalamine  RLS: requip  Hyperlipidemia: continue statin  GERD: continue PPI  Anxiety: continue xanax  Nocturnal Polyuria: continue DDAVP  Leukocytosis: likely 2/2 steroids  NAGMA: 2/2 NS resuscitation, continue to follow  Hypomagnesemia: replace and follow - replace orally, caution with MG  Hypokalemia: replace and follow  DVT prophylaxis: heparin  Code Status: full  Family Communication: none at bedside - called husband, 12/3 - called 12/4, no answer Disposition Plan:pending further improvement  Consultants:   none  Procedures:  none  Antimicrobials:  Anti-infectives (From admission, onward)   Start     Dose/Rate Route Frequency Ordered Stop   02/19/19 1500  ceFAZolin (ANCEF) IVPB 1 g/50 mL premix     1 g 100 mL/hr over 30 Minutes Intravenous Every 8 hours 02/19/19 1410     02/18/19 1700  cefTRIAXone (ROCEPHIN) 1 g in sodium chloride 0.9 % 100 mL IVPB  Status:  Discontinued     1 g 200 mL/hr over 30 Minutes Intravenous Every 24 hours 02/18/19 1623  02/19/19 1410     Subjective: SOB improved after lasix, but still present Doesn't feel ready for d/c  Objective: Vitals:   02/20/19 1555 02/20/19 1900 02/21/19 0506 02/21/19 0847  BP: (!) 145/116 (!) 155/69 (!) 167/73 (!) 145/71  Pulse: 65 (!) 59 (!) 51 68  Resp: 20 17 16 16   Temp: 98.4 F (36.9 C) 97.6 F (36.4 C) 98.3 F (36.8 C) 98.9 F (37.2 C)  TempSrc: Oral Oral  Oral Oral  SpO2: 96% 100% 96% 96%  Weight:      Height:        Intake/Output Summary (Last 24 hours) at 02/21/2019 1546 Last data filed at 02/21/2019 1444 Gross per 24 hour  Intake 450 ml  Output 2100 ml  Net -1650 ml   Filed Weights   02/16/19 1521  Weight: 67.7 kg    Examination:  General: No acute distress. Cardiovascular: RRR Lungs: unlabored, CTAB Abdomen: Soft, nontender, nondistended Neurological: Alert and oriented 3. Moves all extremities 4 . Cranial nerves II through XII grossly intact. Skin: Warm and dry. No rashes or lesions. Extremities: trace edema   Data Reviewed: I have personally reviewed following labs and imaging studies  CBC: Recent Labs  Lab 02/16/19 1733 02/17/19 1210 02/18/19 0350 02/19/19 0540 02/20/19 0318 02/21/19 0330  WBC 17.4* 13.0* 9.3 16.3* 21.0* 20.4*  NEUTROABS 13.4*  --  7.5 14.3* 17.4* 14.7*  HGB 13.2 10.0* 9.0* 8.4* 8.8* 9.4*  HCT 39.3 29.1* 26.9* 25.4* 26.5* 26.8*  MCV 94.2 94.8 95.1 96.6 96.7 93.1  PLT 275 184 194 208 224 0000000   Basic Metabolic Panel: Recent Labs  Lab 02/17/19 1210 02/18/19 0350 02/19/19 0540 02/20/19 0315 02/20/19 0318 02/21/19 0330  NA 137 138 142  --  139 139  K 2.9* 3.5 3.6  --  3.6 3.2*  CL 103 106 110  --  107 99  CO2 21* 18* 17*  --  17* 21*  GLUCOSE 92 199* 161*  --  148* 165*  BUN 36* 36* 29*  --  29* 35*  CREATININE 1.73* 1.67* 1.22*  --  1.14* 1.14*  CALCIUM 7.8* 7.6* 7.4*  --  7.4* 7.7*  MG  --   --  1.4* 1.4*  --  1.5*   GFR: Estimated Creatinine Clearance: 48 mL/min (Teresa Lee) (by C-G formula based on SCr of 1.14 mg/dL (H)). Liver Function Tests: Recent Labs  Lab 02/17/19 1210 02/18/19 0350 02/19/19 0540 02/20/19 0318 02/21/19 0330  AST 17 16 20  34 28  ALT 9 10 12 22 25   ALKPHOS 50 47 45 56 64  BILITOT 1.0 0.5 0.3 0.6 0.8  PROT 6.2* 6.0* 5.9* 6.2* 6.4*  ALBUMIN 3.0* 2.8* 2.7* 3.0* 3.0*   No results for input(s): LIPASE, AMYLASE in the last 168 hours. No results for  input(s): AMMONIA in the last 168 hours. Coagulation Profile: No results for input(s): INR, PROTIME in the last 168 hours. Cardiac Enzymes: No results for input(s): CKTOTAL, CKMB, CKMBINDEX, TROPONINI in the last 168 hours. BNP (last 3 results) No results for input(s): PROBNP in the last 8760 hours. HbA1C: No results for input(s): HGBA1C in the last 72 hours. CBG: No results for input(s): GLUCAP in the last 168 hours. Lipid Profile: No results for input(s): CHOL, HDL, LDLCALC, TRIG, CHOLHDL, LDLDIRECT in the last 72 hours. Thyroid Function Tests: No results for input(s): TSH, T4TOTAL, FREET4, T3FREE, THYROIDAB in the last 72 hours. Anemia Panel: Recent Labs    02/20/19 0318 02/21/19 0330  FERRITIN 605*  557*   Sepsis Labs: Recent Labs  Lab 02/16/19 1821  LATICACIDVEN 1.7    Recent Results (from the past 240 hour(s))  Culture, blood (routine x 2)     Status: None   Collection Time: 02/16/19  5:59 PM   Specimen: BLOOD  Result Value Ref Range Status   Specimen Description   Final    BLOOD LEFT ANTECUBITAL Performed at [facility], [address] [place], [name] [address]    Special Requests   Final    BOTTLES DRAWN AEROBIC AND ANAEROBIC Blood Culture results may not be optimal due to an inadequate volume of blood received in culture bottles Performed at [facility], [address] [place], [name] [address]    Culture   Final    NO GROWTH 5 DAYS Performed at [facility], [place] [address], [place], [place] [address]    Report Status 02/21/2019 FINAL  Final  Culture, blood (routine x 2)     Status: None   Collection Time: 02/16/19  6:21 PM   Specimen: BLOOD  Result Value Ref Range Status   Specimen Description   Final    BLOOD RIGHT HAND Performed at [facility], [address] [place], [name] [address]    Special Requests   Final    BOTTLES DRAWN AEROBIC ONLY Blood Culture results may not be optimal due to an inadequate  volume of blood received in culture bottles Performed at [facility], [address] [place], [name] [address]    Culture   Final    NO GROWTH 5 DAYS Performed at [facility], [place] [address] [place], [place] [address]    Report Status 02/21/2019 FINAL  Final  Culture, Urine     Status: Abnormal   Collection Time: 02/17/19  9:27 AM   Specimen: Urine, Clean Catch  Result Value Ref Range Status   Specimen Description   Final    URINE, CLEAN CATCH Performed at [facility], [place] [address], [place], [place] [address]    Special Requests   Final    NONE Performed at [facility], [place] [address] [place], [place] [address]    Culture (Teresa Lee)  Final    >=100,000 COLONIES/mL ESCHERICHIA COLI 50,000 COLONIES/mL PROTEUS MIRABILIS    Report Status 02/19/2019 FINAL  Final   Organism ID, Bacteria ESCHERICHIA COLI (Teresa Lee)  Final   Organism ID, Bacteria PROTEUS MIRABILIS (Teresa Lee)  Final      Susceptibility   Escherichia coli - MIC*    AMPICILLIN <=2 SENSITIVE Sensitive     CEFAZOLIN <=4 SENSITIVE Sensitive     CEFTRIAXONE <=1 SENSITIVE Sensitive     CIPROFLOXACIN <=0.25 SENSITIVE Sensitive     GENTAMICIN <=1 SENSITIVE Sensitive     IMIPENEM <=0.25 SENSITIVE Sensitive     NITROFURANTOIN <=16 SENSITIVE Sensitive     TRIMETH/SULFA <=20 SENSITIVE Sensitive     AMPICILLIN/SULBACTAM <=2 SENSITIVE Sensitive     PIP/TAZO <=4 SENSITIVE Sensitive     Extended ESBL NEGATIVE Sensitive     * >=100,000 COLONIES/mL ESCHERICHIA COLI   Proteus mirabilis - MIC*    AMPICILLIN <=2 SENSITIVE Sensitive     CEFAZOLIN <=4 SENSITIVE Sensitive     CEFTRIAXONE <=1 SENSITIVE Sensitive     CIPROFLOXACIN <=0.25 SENSITIVE Sensitive     GENTAMICIN <=1 SENSITIVE Sensitive     IMIPENEM 4 SENSITIVE Sensitive     NITROFURANTOIN 128  RESISTANT Resistant     TRIMETH/SULFA <=20 SENSITIVE Sensitive     AMPICILLIN/SULBACTAM <=2 SENSITIVE Sensitive     PIP/TAZO <=4 SENSITIVE  Sensitive     * 50,000 COLONIES/mL PROTEUS MIRABILIS         Radiology Studies: Dg Chest Port 1 View  Result Date: 02/21/2019 CLINICAL DATA:  Dyspnea, COVID-19 pneumonia EXAM: PORTABLE CHEST 1 VIEW COMPARISON:  Chest radiograph from one day prior. FINDINGS: Stable configuration of median sternotomy wires with discontinuity in the lower most wire. Inferior approach spinal stimulator leads terminate over the lower thoracic spinal canal. Stable cardiomediastinal silhouette with normal heart size. No pneumothorax. No definite pleural effusion. No pulmonary edema. Stable streaky opacities overlying the mid to lower lungs. IMPRESSION: Stable chest radiograph with streaky opacities overlying the mid to lower lungs, which could represent scarring, atelectasis or atypical pneumonia. Electronically Signed   By: Teresa Lee M.D.   On: 02/21/2019 08:14   Dg Chest Port 1 View  Result Date: 02/20/2019 CLINICAL DATA:  Shortness of breath. EXAM: PORTABLE CHEST 1 VIEW COMPARISON:  02/19/2019.  CT 09/06/2017. FINDINGS: Prior median sternotomy. Fracture of the lower sternotomy wire again noted. Heart size normal. Bibasilar atelectasis and infiltrates again noted without interim change. Small bilateral pleural effusions again noted without interim change. No pneumothorax. No acute bony abnormality. Neurostimulator wires noted over the lower thoracic spine in stable position. IMPRESSION: Persistent bibasilar atelectasis and infiltrates again noted without interim change. Small bilateral pleural effusions again noted without interim change. Electronically Signed   By: Teresa Lee  Register   On: 02/20/2019 09:14        Scheduled Meds: . desmopressin  0.3 mg Oral QHS  . furosemide  20 mg Intravenous Q6H  . heparin  5,000 Units Subcutaneous Q8H  . losartan  50 mg Oral Daily  . magnesium oxide  400 mg Oral BID  . Mesalamine  800 mg Oral BID  . methylPREDNISolone (SOLU-MEDROL) injection  0.5 mg/kg Intravenous Q12H   . mometasone-formoterol  2 puff Inhalation BID  . mycophenolate  1,000 mg Oral BID  . pantoprazole  40 mg Oral Daily  . pyridostigmine  30 mg Oral Q8H  . rOPINIRole  1 mg Oral QHS  . rosuvastatin  40 mg Oral Daily   Continuous Infusions: .  ceFAZolin (ANCEF) IV 1 g (02/21/19 1441)     LOS: 5 days    Time spent: over 30 min    Teresa Helper, MD [facility] Pager AMION  If 7PM-7AM, please contact night-coverage [email or web address] Password [facility] 02/21/2019, 3:46 PM

## 2019-02-21 NOTE — TOC Progression Note (Signed)
Transition of Care [facility]) - Progression Note    Patient Details  Name: Teresa Lee MRN: [id number] Date of Birth: [birth year]  [facility] CM/SW Contact  Purcell Mouton, RN Phone Number: 02/21/2019, 2:06 PM  Clinical Narrative:    TOC will continue to follow for [facility] needs. Pt from home with husband.    Expected Discharge Plan: [place] Barriers to Discharge: Continued Medical Work up  Expected Discharge Plan and Services Expected Discharge Plan: [place]                                               Social Determinants of Health (SDOH) Interventions    Readmission Risk Interventions No flowsheet data found.

## 2019-02-21 NOTE — Progress Notes (Signed)
NIF greater than -40

## 2019-02-22 LAB — FERRITIN: Ferritin: 492 ng/mL — ABNORMAL HIGH (ref 11–307)

## 2019-02-22 LAB — CBC WITH DIFFERENTIAL/PLATELET
Abs Immature Granulocytes: 3.34 10*3/uL — ABNORMAL HIGH (ref 0.00–0.07)
Basophils Absolute: 0 10*3/uL (ref 0.0–0.1)
Basophils Relative: 0 %
Eosinophils Absolute: 0 10*3/uL (ref 0.0–0.5)
Eosinophils Relative: 0 %
HCT: 28.8 % — ABNORMAL LOW (ref 36.0–46.0)
Hemoglobin: 9.8 g/dL — ABNORMAL LOW (ref 12.0–15.0)
Immature Granulocytes: 16 %
Lymphocytes Relative: 6 %
Lymphs Abs: 1.3 10*3/uL (ref 0.7–4.0)
MCH: 31.9 pg (ref 26.0–34.0)
MCHC: 34 g/dL (ref 30.0–36.0)
MCV: 93.8 fL (ref 80.0–100.0)
Monocytes Absolute: 1.1 10*3/uL — ABNORMAL HIGH (ref 0.1–1.0)
Monocytes Relative: 5 %
Neutro Abs: 15.6 10*3/uL — ABNORMAL HIGH (ref 1.7–7.7)
Neutrophils Relative %: 73 %
Platelets: 304 10*3/uL (ref 150–400)
RBC: 3.07 MIL/uL — ABNORMAL LOW (ref 3.87–5.11)
RDW: 12.9 % (ref 11.5–15.5)
WBC: 21.4 10*3/uL — ABNORMAL HIGH (ref 4.0–10.5)
nRBC: 0.7 % — ABNORMAL HIGH (ref 0.0–0.2)

## 2019-02-22 LAB — COMPREHENSIVE METABOLIC PANEL
ALT: 22 U/L (ref 0–44)
AST: 21 U/L (ref 15–41)
Albumin: 3 g/dL — ABNORMAL LOW (ref 3.5–5.0)
Alkaline Phosphatase: 59 U/L (ref 38–126)
Anion gap: 19 — ABNORMAL HIGH (ref 5–15)
BUN: 39 mg/dL — ABNORMAL HIGH (ref 6–20)
CO2: 24 mmol/L (ref 22–32)
Calcium: 8.3 mg/dL — ABNORMAL LOW (ref 8.9–10.3)
Chloride: 96 mmol/L — ABNORMAL LOW (ref 98–111)
Creatinine, Ser: 1.09 mg/dL — ABNORMAL HIGH (ref 0.44–1.00)
GFR calc Af Amer: >60 mL/min (ref >60)
GFR calc non Af Amer: 56 mL/min — ABNORMAL LOW (ref >60)
Glucose, Bld: 157 mg/dL — ABNORMAL HIGH (ref 70–99)
Potassium: 4 mmol/L (ref 3.5–5.1)
Sodium: 139 mmol/L (ref 135–145)
Total Bilirubin: 0.6 mg/dL (ref 0.3–1.2)
Total Protein: 6.3 g/dL — ABNORMAL LOW (ref 6.5–8.1)

## 2019-02-22 LAB — GLUCOSE, CAPILLARY: Glucose-Capillary: 228 mg/dL — ABNORMAL HIGH (ref 70–99)

## 2019-02-22 LAB — MAGNESIUM: Magnesium: 1.6 mg/dL — ABNORMAL LOW (ref 1.7–2.4)

## 2019-02-22 LAB — HEMOGLOBIN A1C
Hgb A1c MFr Bld: 5.7 % — ABNORMAL HIGH (ref 4.8–5.6)
Mean Plasma Glucose: 116.89 mg/dL

## 2019-02-22 LAB — D-DIMER, QUANTITATIVE: D-Dimer, Quant: 0.76 ug/mL-FEU — ABNORMAL HIGH (ref 0.00–0.50)

## 2019-02-22 LAB — C-REACTIVE PROTEIN: CRP: 1.9 mg/dL — ABNORMAL HIGH (ref <1.0)

## 2019-02-22 MED ORDER — INSULIN ASPART 100 UNIT/ML ~~LOC~~ SOLN
0.0000 [IU] | Freq: Every day | SUBCUTANEOUS | Status: DC
  Administered 2019-02-22 – 2019-02-23 (×2): 2 [IU] via SUBCUTANEOUS

## 2019-02-22 MED ORDER — INSULIN ASPART 100 UNIT/ML ~~LOC~~ SOLN
0.0000 [IU] | Freq: Three times a day (TID) | SUBCUTANEOUS | Status: DC
  Administered 2019-02-23 – 2019-02-24 (×5): 2 [IU] via SUBCUTANEOUS

## 2019-02-22 MED ORDER — LABETALOL HCL 5 MG/ML IV SOLN: 10.0000 mg | INTRAVENOUS | Status: DC | PRN

## 2019-02-22 NOTE — Progress Notes (Addendum)
**Note De-Identified vi Obfusction** PROGRESS NOTE    Teresa Lee  [id number] DOB: [birth year] DO: 02/16/2019 PCP: Mrtinique, Betty G, MD   Brief Nrrtive:  Teresa Lee  58 y.o.femlewith medicl history significnt ofmystheni grvis, fibromylgi, restless leg syndrome, Crohn's disese, hypertension, hyperlipidemi, presents to the hospitl with chief complint of wekness, poor p.o. intke, ftigue nd generlized mlise. She hs been detected positive for coronvirus on 11/24nd hs been trying to mnge her symptoms t home. She lso been hving nuse, vomiting nd dirrhe. She ws brely ble to et nything in the lst few dys.   She hd no respirtory symptoms on dmission  ssessment & Pln:   ctive Problems:   KI (cute kidney injury) (ntlers)   cute on chronic renl filure (HCC)   COVID-19  COVID 19 Virl Illness  pneumoni:  - negtive CXR on dmission - ws c/o poor PO intke, N/V/D t presenttion - she hs SOB now s noted bove, though O2 sts consistently bove 96% on R - CT with evidence of pneumoni nd trce effusions - strted on remdesivir - will pln to complete this given her commorbidities, her steroids were incresed s noted below due to her being on chronic steroids with cute illness - echo with norml EF - she received lsix on 12/3 nd 12/4  COVID-19 Lbs  Recent Lbs    02/20/19 0318 02/21/19 0330 02/22/19 0445  DDIMER 0.81* 0.89* 0.76*  FERRITIN 605* 557* 492*  CRP 1.7* 3.7* 1.9*    Lb Results  Component Vlue Dte   SRSCOV2N Detected () 02/11/2019    Urinry Trct Infection  E. Coli nd Proteus UTI: - question of whether this ws true UTI in prior notes, but given her generlized mlise, currently treting with ceftrixone -> will nrrow to ncef while inptient - Blood cx NGTD  Mystheni grvis -Monitor NIFs, stble, continue home mediction with CellCept, Mestinon  -On chronic steroids, use higher dose given cute illness   cute Kidney Injury: bseline cretinine is <1.  Improving t this time.  Hypertension: losrtn restrted, follow BP  Crohn's Disese: continue meslmine  RLS: requip  Hyperlipidemi: continue sttin  GERD: continue PPI  nxiety: continue xnx  Nocturnl Polyuri: continue DDVP  Leukocytosis: likely 2/2 steroids  NGM: 2/2 NS resuscittion, continue to follow  Hypomgnesemi: replce nd follow - replce orlly, cution with MG  Hypoklemi: replce nd follow  DVT prophylxis: heprin  Code Sttus: full  Fmily Communiction: none t bedside - clled husbnd, 12/3 - clled 12/4, no nswer - 12/5 discussed with husbnd Disposition Pln:pending further improvement  Consultnts:   none  Procedures:  none  ntimicrobils:  nti-infectives (From dmission, onwrd)   Strt     Dose/Rte Route Frequency Ordered Stop   02/22/19 1000  remdesivir 100 mg in sodium chloride 0.9 % 100 mL IVPB     100 mg 200 mL/hr over 30 Minutes Intrvenous Dily 02/21/19 1907 02/26/19 0959   02/21/19 2000  remdesivir 200 mg in sodium chloride 0.9% 250 mL IVPB     200 mg 580 mL/hr over 30 Minutes Intrvenous Once 02/21/19 1907 02/21/19 2215   02/19/19 1500  ceFZolin (NCEF) IVPB 1 g/50 mL premix     1 g 100 mL/hr over 30 Minutes Intrvenous Every 8 hours 02/19/19 1410     02/18/19 1700  cefTRIXone (ROCEPHIN) 1 g in sodium chloride 0.9 % 100 mL IVPB  Sttus:  Discontinued     1 g 200 mL/hr over 30 Minutes Intrvenous Every 24 hours 02/18/19 1623  02/19/19 1410     Subjective: Still SOB, but improved  Objective: Vitals:   02/21/19 1931 02/21/19 2030 02/22/19 0354 02/22/19 0852  BP: 125/73 125/73 (!) 175/76 (!) 148/77  Pulse: 67 67 (!) 59 65  Resp: 18 18 17 18   Temp: 98.1 F (36.7 C) 98.1 F (36.7 C) 98.6 F (37 C) 98.2 F (36.8 C)  TempSrc: Oral Oral Oral Oral  SpO2:  97% 96% 97%  Weight:      Height:        Intake/Output Summary (Last 24 hours) at 02/22/2019 1720  Last data filed at 02/22/2019 1030 Gross per 24 hour  Intake -  Output 1250 ml  Net -1250 ml   Filed Weights   02/16/19 1521  Weight: 67.7 kg    Examination:  General: No acute distress. Cardiovascular: RRR Lungs: unlabored bdomen: Soft, nontender, nondistended  Neurological: lert and oriented 3. Moves all extremities 4 . Cranial nerves II through XII grossly intact. Skin: Warm and dry. No rashes or lesions. Extremities: No clubbing or cyanosis. No edema.   Data Reviewed: I have personally reviewed following labs and imaging studies  CBC: Recent Labs  Lab 02/18/19 0350 02/19/19 0540 02/20/19 0318 02/21/19 0330 02/22/19 0445  WBC 9.3 16.3* 21.0* 20.4* 21.4*  NEUTROBS 7.5 14.3* 17.4* 14.7* 15.6*  HGB 9.0* 8.4* 8.8* 9.4* 9.8*  HCT 26.9* 25.4* 26.5* 26.8* 28.8*  MCV 95.1 96.6 96.7 93.1 93.8  PLT 194 208 224 258 003   Basic Metabolic Panel: Recent Labs  Lab 02/18/19 0350 02/19/19 0540 02/20/19 0315 02/20/19 0318 02/21/19 0330 02/22/19 0445  N 138 142  --  139 139 139  K 3.5 3.6  --  3.6 3.2* 4.0  CL 106 110  --  107 99 96*  CO2 18* 17*  --  17* 21* 24  GLUCOSE 199* 161*  --  148* 165* 157*  BUN 36* 29*  --  29* 35* 39*  CRETININE 1.67* 1.22*  --  1.14* 1.14* 1.09*  CLCIUM 7.6* 7.4*  --  7.4* 7.7* 8.3*  MG  --  1.4* 1.4*  --  1.5* 1.6*   GFR: Estimated Creatinine Clearance: 50.2 mL/min () (by C-G formula based on SCr of 1.09 mg/dL (H)). Liver Function Tests: Recent Labs  Lab 02/18/19 0350 02/19/19 0540 02/20/19 0318 02/21/19 0330 02/22/19 0445  ST 16 20 34 28 21  LT 10 12 22 25 22   LKPHOS 47 45 56 64 59  BILITOT 0.5 0.3 0.6 0.8 0.6  PROT 6.0* 5.9* 6.2* 6.4* 6.3*  LBUMIN 2.8* 2.7* 3.0* 3.0* 3.0*   No results for input(s): LIPSE, MYLSE in the last 168 hours. No results for input(s): MMONI in the last 168 hours. Coagulation Profile: No results for input(s): INR, PROTIME in the last 168 hours. Cardiac Enzymes: No results for  input(s): CKTOTL, CKMB, CKMBINDEX, TROPONINI in the last 168 hours. BNP (last 3 results) No results for input(s): PROBNP in the last 8760 hours. Hb1C: No results for input(s): HGB1C in the last 72 hours. CBG: No results for input(s): GLUCP in the last 168 hours. Lipid Profile: No results for input(s): CHOL, HDL, LDLCLC, TRIG, CHOLHDL, LDLDIRECT in the last 72 hours. Thyroid Function Tests: No results for input(s): TSH, T4TOTL, FREET4, T3FREE, THYROIDB in the last 72 hours. nemia Panel: Recent Labs    02/21/19 0330 02/22/19 0445  FERRITIN 557* 492*   Sepsis Labs: Recent Labs  Lab 02/16/19 1821  LTICCIDVEN 1.7    Recent Results (from  the past 240 hour(s))  Culture, blood (routine x 2)     Status: None   Collection Time: 02/16/19  5:59 PM   Specimen: BLOOD  Result Value Ref Range Status   Specimen Description   Final    BLOOD LEFT NTECUBITL Performed at [facility], [address] [place], [place] [address]    Special Requests   Final    BOTTLES DRWN EROBIC ND NEROBIC Blood Culture results may not be optimal due to an inadequate volume of blood received in culture bottles Performed at [facility], [address] [place], [place] [address]    Culture   Final    NO GROWTH 5 DYS Performed at [facility], [place] [address], [place], [place] [address]    Report Status 02/21/2019 FINL  Final  Culture, blood (routine x 2)     Status: None   Collection Time: 02/16/19  6:21 PM   Specimen: BLOOD  Result Value Ref Range Status   Specimen Description   Final    BLOOD RIGHT HND Performed at [facility], [address] [place], [place] [address]    Special Requests   Final    BOTTLES DRWN EROBIC ONLY Blood Culture results may not be optimal due to an inadequate volume of blood received in culture bottles Performed at [facility], [address] [place], [place] [address]    Culture   Final    NO  GROWTH 5 DYS Performed at [facility], [place] [address] [place], [place] [address]    Report Status 02/21/2019 FINL  Final  Culture, Urine     Status: bnormal   Collection Time: 02/17/19  9:27 M   Specimen: Urine, Clean Catch  Result Value Ref Range Status   Specimen Description   Final    URINE, CLEN CTCH Performed at [facility], [place] [address], [place], [place] [address]    Special Requests   Final    NONE Performed at [facility], [place] [address] [place], [place] [address]    Culture ()  Final    >=100,000 COLONIES/mL ESCHERICHI COLI 50,000 COLONIES/mL PROTEUS MIRBILIS    Report Status 02/19/2019 FINL  Final   Organism ID, Bacteria ESCHERICHI COLI ()  Final   Organism ID, Bacteria PROTEUS MIRBILIS ()  Final      Susceptibility   Escherichia coli - MIC*    MPICILLIN <=2 SENSITIVE Sensitive     CEFZOLIN <=4 SENSITIVE Sensitive     CEFTRIXONE <=1 SENSITIVE Sensitive     CIPROFLOXCIN <=0.25 SENSITIVE Sensitive     GENTMICIN <=1 SENSITIVE Sensitive     IMIPENEM <=0.25 SENSITIVE Sensitive     NITROFURNTOIN <=16 SENSITIVE Sensitive     TRIMETH/SULF <=20 SENSITIVE Sensitive     MPICILLIN/SULBCTM <=2 SENSITIVE Sensitive     PIP/TZO <=4 SENSITIVE Sensitive     Extended ESBL NEGTIVE Sensitive     * >=100,000 COLONIES/mL ESCHERICHI COLI   Proteus mirabilis - MIC*    MPICILLIN <=2 SENSITIVE Sensitive     CEFZOLIN <=4 SENSITIVE Sensitive     CEFTRIXONE <=1 SENSITIVE Sensitive     CIPROFLOXCIN <=0.25 SENSITIVE Sensitive     GENTMICIN <=1 SENSITIVE Sensitive     IMIPENEM 4 SENSITIVE Sensitive     NITROFURNTOIN 128 RESISTNT Resistant     TRIMETH/SULF <=20 SENSITIVE Sensitive     MPICILLIN/SULBCTM <=2 SENSITIVE Sensitive **Note De-Identified vi Obfusction** PIP/TAZO <=4 SENSITIVE Sensitive     * 50,000 COLONIES/mL PROTEUS MIRABILIS         Rdiology Studies: Ct Angio Chest Pe W Or Wo Contrst  Result Dte: 02/21/2019  CLINICAL DATA:  Shortness of breth. COVID-19 infection. EXAM: CT ANGIOGRAPHY CHEST WITH CONTRAST TECHNIQUE: Multidetector CT imging of the chest ws performed using the stndrd protocol during bolus dministrtion of intrvenous contrst. Multiplnr CT imge reconstructions nd MIPs were obtined to evlute the vsculr ntomy. CONTRAST:  97m OMNIPAQUE IOHEXOL 350 MG/ML SOLN COMPARISON:  Rdiogrphs 02/21/2019 nd 02/20/2019. CT 09/05/2017. FINDINGS: Crdiovsculr: The pulmonry rteries re well opcified with contrst to the level of the subsegmentl brnches. There is no evidence of cute pulmonry embolism. No significnt systemic rteril bnormlities. The hert size is norml. There is no pericrdil effusion. Medistinum/Nodes: There re no enlrged medistinl, hilr or xillry lymph nodes. The thyroid glnd, trche nd esophgus demonstrte no significnt findings. Lungs/Pleur: Trce bilterl pleurl effusions. There is no pneumothorx. There re ptchy ground-glss opcities, gretest within the right upper lobe. Scttered res liner telectsis re present in both lungs. There is no consolidtion or suspicious pulmonry nodule. Upper bdomen: The visulized upper bdomen ppers unremrkble. Musculoskeletl/Chest wll: There is no chest wll mss or suspicious osseous finding. Ptient is sttus post medin sternotomy. There is  thorcic spinl stimultor. Review of the MIP imges confirms the bove findings. IMPRESSION: 1. No evidence of cute pulmonry embolism. 2. Ptchy ground-glss pulmonry opcities, likely indictive of mild virl pneumoni. No consolidtion. 3. Trce bilterl pleurl effusions. Electroniclly Signed   By: WRichrden SleM.D.   On: 02/21/2019 16:15   Dg Chest Port 1 View  Result Dte: 02/21/2019 CLINICAL DATA:  Dyspne, COVID-19 pneumoni EXAM: PORTABLE CHEST 1 VIEW COMPARISON:  Chest rdiogrph from one dy prior. FINDINGS: Stble configurtion of medin  sternotomy wires with discontinuity in the lower most wire. Inferior pproch spinl stimultor leds terminte over the lower thorcic spinl cnl. Stble crdiomedistinl silhouette with norml hert size. No pneumothorx. No definite pleurl effusion. No pulmonry edem. Stble streky opcities overlying the mid to lower lungs. IMPRESSION: Stble chest rdiogrph with streky opcities overlying the mid to lower lungs, which could represent scrring, telectsis or typicl pneumoni. Electroniclly Signed   By: JIlon SorrelM.D.   On: 02/21/2019 08:14        Scheduled Meds: . desmopressin  0.3 mg Orl QHS  . heprin  5,000 Units Subcutneous Q8H  . losrtn  50 mg Orl Dily  . mgnesium oxide  400 mg Orl BID  . Meslmine  800 mg Orl BID  . methylPREDNISolone (SOLU-MEDROL) injection  0.5 mg/kg Intrvenous Q12H  . mometsone-formoterol  2 puff Inhltion BID  . mycophenolte  1,000 mg Orl BID  . pntoprzole  40 mg Orl Dily  . pyridostigmine  30 mg Orl Q8H  . rOPINIRole  1 mg Orl QHS  . rosuvsttin  40 mg Orl Dily   Continuous Infusions: .  ceFAZolin (ANCEF) IV 1 g (02/22/19 1331)  . remdesivir 100 mg in NS 100 mL 100 mg (02/22/19 1106)     LOS: 6 dys    Time spent: over 30 min    CFyrene Helper MD [facility] AMION  If 7PM-7AM, plese contct night-coverge [email or web address] Pssword [email or web address] 02/22/2019, 5:20 PM

## 2019-02-22 NOTE — Progress Notes (Signed)
NIF greater than -40 x3 attempts. Pt with great effort. RT will continue to monitor.

## 2019-02-22 NOTE — Plan of Care (Signed)
Husband called and gave update for POC, patinet status and possible DC plan with HH once medically cleared (possible 12/8).

## 2019-02-23 LAB — CBC WITH DIFFERENTIAL/PLATELET
Abs Immature Granulocytes: 1.6 10*3/uL — ABNORMAL HIGH (ref 0.00–0.07)
Band Neutrophils: 5 %
Basophils Absolute: 0 10*3/uL (ref 0.0–0.1)
Basophils Relative: 0 %
Eosinophils Absolute: 0 10*3/uL (ref 0.0–0.5)
Eosinophils Relative: 0 %
HCT: 28.7 % — ABNORMAL LOW (ref 36.0–46.0)
Hemoglobin: 9.7 g/dL — ABNORMAL LOW (ref 12.0–15.0)
Lymphocytes Relative: 4 %
Lymphs Abs: 0.9 10*3/uL (ref 0.7–4.0)
MCH: 32.2 pg (ref 26.0–34.0)
MCHC: 33.8 g/dL (ref 30.0–36.0)
MCV: 95.3 fL (ref 80.0–100.0)
Metamyelocytes Relative: 2 %
Monocytes Absolute: 0.5 10*3/uL (ref 0.1–1.0)
Monocytes Relative: 2 %
Myelocytes: 5 %
Neutro Abs: 20 10*3/uL — ABNORMAL HIGH (ref 1.7–7.7)
Neutrophils Relative %: 82 %
Platelets: 325 10*3/uL (ref 150–400)
RBC: 3.01 MIL/uL — ABNORMAL LOW (ref 3.87–5.11)
RDW: 13 % (ref 11.5–15.5)
WBC: 23 10*3/uL — ABNORMAL HIGH (ref 4.0–10.5)
nRBC: 0.5 % — ABNORMAL HIGH (ref 0.0–0.2)

## 2019-02-23 LAB — D-DIMER, QUANTITATIVE: D-Dimer, Quant: 0.63 ug/mL-FEU — ABNORMAL HIGH (ref 0.00–0.50)

## 2019-02-23 LAB — COMPREHENSIVE METABOLIC PANEL
ALT: 22 U/L (ref 0–44)
AST: 22 U/L (ref 15–41)
Albumin: 2.9 g/dL — ABNORMAL LOW (ref 3.5–5.0)
Alkaline Phosphatase: 52 U/L (ref 38–126)
Anion gap: 15 (ref 5–15)
BUN: 41 mg/dL — ABNORMAL HIGH (ref 6–20)
CO2: 25 mmol/L (ref 22–32)
Calcium: 8.9 mg/dL (ref 8.9–10.3)
Chloride: 98 mmol/L (ref 98–111)
Creatinine, Ser: 1.06 mg/dL — ABNORMAL HIGH (ref 0.44–1.00)
GFR calc Af Amer: >60 mL/min (ref >60)
GFR calc non Af Amer: 58 mL/min — ABNORMAL LOW (ref >60)
Glucose, Bld: 166 mg/dL — ABNORMAL HIGH (ref 70–99)
Potassium: 4.6 mmol/L (ref 3.5–5.1)
Sodium: 138 mmol/L (ref 135–145)
Total Bilirubin: 0.6 mg/dL (ref 0.3–1.2)
Total Protein: 5.9 g/dL — ABNORMAL LOW (ref 6.5–8.1)

## 2019-02-23 LAB — GLUCOSE, CAPILLARY
Glucose-Capillary: 158 mg/dL — ABNORMAL HIGH (ref 70–99)
Glucose-Capillary: 159 mg/dL — ABNORMAL HIGH (ref 70–99)
Glucose-Capillary: 153 mg/dL — ABNORMAL HIGH (ref 70–99)
Glucose-Capillary: 226 mg/dL — ABNORMAL HIGH (ref 70–99)

## 2019-02-23 LAB — FERRITIN: Ferritin: 497 ng/mL — ABNORMAL HIGH (ref 11–307)

## 2019-02-23 LAB — MAGNESIUM: Magnesium: 1.7 mg/dL (ref 1.7–2.4)

## 2019-02-23 LAB — C-REACTIVE PROTEIN: CRP: 1.7 mg/dL — ABNORMAL HIGH (ref <1.0)

## 2019-02-23 MED ORDER — PREDNISONE 20 MG PO TABS
40.0000 mg | ORAL_TABLET | Freq: Every day | ORAL | Status: DC
  Administered 2019-02-24 – 2019-02-25 (×2): 40 mg via ORAL
  Filled 2019-02-23 (×2): qty 2

## 2019-02-23 NOTE — Progress Notes (Signed)
NIF greater than -40. Pt performed with excellent effort. RT will continue to monitor.

## 2019-02-23 NOTE — Progress Notes (Signed)
Occupational Therapy Treatment Patient Details Name: Teresa Lee MRN: [id number] DOB: 1960/10/01 Today's Date: 02/23/2019    History of present illness Teresa Lee is a 58 y.o. female with medical history significant of myasthenia gravis, fibromyalgia, restless leg syndrome, Crohn's disease, hypertension, hyperlipidemia, presents to the hospital with chief complaint of weakness, poor p.o. intake, fatigue and generalized malaise.  She has been detected positive for coronavirus on 11/24 and has been trying to manage her symptoms at home.  She also been having nausea, vomiting and diarrhea   OT comments  Pt progressing in therapy, demonstrating improved activity tolerance and independence in self-care and functional transfer tasks. Continued education with pt and provided handouts in regards to activity modifications, energy conservation, and meal/grocery delivery services. Educated pt on standing exercises with handout provided. Pt demo good understanding and follow through of exercises. Pt's O2 SATs maintained at 99% on RA throughout all tasks. 2/4 DOE. All questions/concerns answered at this time. Discharge plan remains appropriate.   Follow Up Recommendations  Supervision - Intermittent;No OT follow up    Equipment Recommendations  None recommended by OT    Recommendations for Other Services      Precautions / Restrictions Precautions Precautions: Fall Restrictions Weight Bearing Restrictions: No       Mobility Bed Mobility Overal bed mobility: Independent                Transfers Overall transfer level: Needs assistance Equipment used: None Transfers: Sit to/from Stand Sit to Stand: Supervision              Balance Overall balance assessment: Needs assistance   Sitting balance-Leahy Scale: Good       Standing balance-Leahy Scale: Fair                             ADL either performed or assessed with clinical judgement   ADL                       Lower Body Dressing: Sitting/lateral leans;Independent   Toilet Transfer: Independent;Ambulation   Toileting- Clothing Manipulation and Hygiene: Independent;Sit to/from stand       Functional mobility during ADLs: Supervision/safety       Vision       Perception     Praxis      Cognition Arousal/Alertness: Awake/alert Behavior During Therapy: WFL for tasks assessed/performed Overall Cognitive Status: Within Functional Limits for tasks assessed                                          Exercises Exercises: Other exercises Other Exercises Other Exercises: Incentive spirometer x 10. Pulling 1530mL Other Exercises: Flutter valve x 10 Other Exercises: Standing exercises. 4 exercises total with 10 reps each (standing squats, marching in place, BLE abd/add, and heel raises).   Shoulder Instructions       General Comments Pt's O2 SATs maintained at 99% on RA throughout all activities. Continued education/instruction with pt on safety strategies, activity modifications, energy conservation techniques, and relaxation strategies. Provided handouts related to energy conservation, pursed lip breathing, delivery services, and standing exercises.    Pertinent Vitals/ Pain       Pain Assessment: No/denies pain  Home Living  Prior Functioning/Environment              Frequency           Progress Toward Goals  OT Goals(current goals can now be found in the care plan section)  Progress towards OT goals: Progressing toward goals  ADL Goals Pt Will Perform Lower Body Bathing: with modified independence;sit to/from stand Pt Will Perform Lower Body Dressing: with modified independence;sit to/from stand Pt Will Transfer to Toilet: with modified independence Pt/caregiver will Perform Home Exercise Program: Increased strength;With theraband;With written HEP  provided;Independently Additional ADL Goal #1: Pt will independently state 3 energy conservation strategies for ADL and functional mobility for ADL  Plan Discharge plan remains appropriate    Co-evaluation                 AM-PAC OT "6 Clicks" Daily Activity     Outcome Measure   Help from another person eating meals?: None Help from another person taking care of personal grooming?: None Help from another person toileting, which includes using toliet, bedpan, or urinal?: None Help from another person bathing (including washing, rinsing, drying)?: A Little Help from another person to put on and taking off regular upper body clothing?: None Help from another person to put on and taking off regular lower body clothing?: None 6 Click Score: 23    End of Session    OT Visit Diagnosis: Unsteadiness on feet (R26.81);Muscle weakness (generalized) (M62.81)   Activity Tolerance Patient tolerated treatment well   Patient Left in bed;with call bell/phone within reach   Nurse Communication Mobility status        Time: 1321-1350 OT Time Calculation (min): 29 min  Charges: OT General Charges $OT Visit: 1 Visit OT Treatments $Therapeutic Activity: 8-22 mins $Therapeutic Exercise: 8-22 mins  Mauri Brooklyn OTR/L [phone]    Mauri Brooklyn 02/23/2019, 2:11 PM

## 2019-02-23 NOTE — Progress Notes (Signed)
PROGRESS NOTE    Teresa Lee  JTT:[id number] DOB: 31-Dec-1960 DOA: 02/16/2019 PCP: [place], Betty G, MD   Brief Narrative:  Teresa Lee a 58 y.o.femalewith medical history significant ofmyasthenia gravis, fibromyalgia, restless leg syndrome, Crohn's disease, hypertension, hyperlipidemia, presents to the hospital with chief complaint of weakness, poor p.o. intake, fatigue and generalized malaise. She has been detected positive for coronavirus on 11/24and has been trying to manage her symptoms at home. She also been having nausea, vomiting and diarrhea. She was barely able to eat anything in the last few days.   She had no respiratory symptoms on admission  Assessment & Plan:   Active Problems:   AKI (acute kidney injury) ([place])   Acute on chronic renal failure (HCC)   COVID-19  COVID 19 Viral Illness  pneumonia:  - negative CXR on admission - was c/o poor PO intake, N/V/D at presentation - continue SOB, but doing well on RA - CT with evidence of pneumonia and trace effusions - started on remdesivir - will plan to complete this given her commorbidities, her steroids were increased as noted below due to her being on chronic steroids with acute illness - day 3 today of remdesivir - will begin to taper steroids - echo with normal EF - she received lasix on 12/3 and 12/4  COVID-19 Labs  Recent Labs    02/21/19 0330 02/22/19 0445 02/23/19 0538 02/23/19 0539  DDIMER 0.89* 0.76* 0.63*  --   FERRITIN 557* 492*  --  497*  CRP 3.7* 1.9*  --  1.7*    Lab Results  Component Value Date   SARSCOV2NAA Detected (A) 02/11/2019    Urinary Tract Infection  E. Coli and Proteus UTI: - question of whether this was true UTI in prior notes, but given her generalized malaise, currently treating with ceftriaxone -> will narrow to ancef while inpatient - plan for 7 total days treatment - Blood cx NGTD  Myasthenia gravis -Monitor NIFs, stable, continue home medication with  CellCept, Mestinon  -On chronic steroids, use higher dose given acute illness  Acute Kidney Injury: baseline creatinine is <1.  Improving at this time.  Hypertension: losartan restarted, follow BP  Crohn's Disease: continue mesalamine  RLS: requip  Hyperlipidemia: continue statin  GERD: continue PPI  Anxiety: continue xanax  Nocturnal Polyuria: continue DDAVP  Leukocytosis: likely 2/2 steroids  NAGMA: 2/2 NS resuscitation, continue to follow  Hypomagnesemia: replace and follow - replace orally, caution with MG  Hypokalemia: replace and follow  Prediabetes  Steroid Induced Hyperglycemia: ssi, follow - follow outpatient  DVT prophylaxis: heparin  Code Status: full  Family Communication: none at bedside - called husband, 12/3 - called 12/4, no answer - 12/5 discussed with husband Disposition Plan:pending further improvement  Consultants:   none  Procedures:  none  Antimicrobials:  Anti-infectives (From admission, onward)   Start     Dose/Rate Route Frequency Ordered Stop   02/22/19 1000  remdesivir 100 mg in sodium chloride 0.9 % 100 mL IVPB     100 mg 200 mL/hr over 30 Minutes Intravenous Daily 02/21/19 1907 02/26/19 0959   02/21/19 2000  remdesivir 200 mg in sodium chloride 0.9% 250 mL IVPB     200 mg 580 mL/hr over 30 Minutes Intravenous Once 02/21/19 1907 02/21/19 2215   02/19/19 1500  ceFAZolin (ANCEF) IVPB 1 g/50 mL premix     1 g 100 mL/hr over 30 Minutes Intravenous Every 8 hours 02/19/19 1410 02/25/19 1359   02/18/19 1700  cefTRIAXone (ROCEPHIN) 1 g in sodium chloride 0.9 % 100 mL IVPB  Status:  Discontinued     1 g 200 mL/hr over 30 Minutes Intravenous Every 24 hours 02/18/19 1623 02/19/19 1410     Subjective: Doing ok today, feels about the same  Objective: Vitals:   02/23/19 0416 02/23/19 0800 02/23/19 1005 02/23/19 1530  BP: (!) 179/82 (!) 154/69 (!) 148/75 (!) 156/78  Pulse: (!) 58 65 68 (!) 59  Resp: 18 17 18 17   Temp: 98 F (36.7 C)  97.9 F (36.6 C) 98.4 F (36.9 C) 97.9 F (36.6 C)  TempSrc: Oral Oral Oral Oral  SpO2: 96% 97% 98% 96%  Weight:      Height:        Intake/Output Summary (Last 24 hours) at 02/23/2019 1810 Last data filed at 02/23/2019 0800 Gross per 24 hour  Intake 390 ml  Output -  Net 390 ml   Filed Weights   02/16/19 1521  Weight: 67.7 kg    Examination:  General: No acute distress. Cardiovascular: RRR Lungs: unlabored Abdomen: Soft, nontender, nondistended Neurological: Alert and oriented 3. Moves all extremities 4. Cranial nerves II through XII grossly intact. Skin: Warm and dry. No rashes or lesions. Extremities: No clubbing or cyanosis. No edema.   Data Reviewed: I have personally reviewed following labs and imaging studies  CBC: Recent Labs  Lab 02/19/19 0540 02/20/19 0318 02/21/19 0330 02/22/19 0445 02/23/19 0538  WBC 16.3* 21.0* 20.4* 21.4* 23.0*  NEUTROABS 14.3* 17.4* 14.7* 15.6* 20.0*  HGB 8.4* 8.8* 9.4* 9.8* 9.7*  HCT 25.4* 26.5* 26.8* 28.8* 28.7*  MCV 96.6 96.7 93.1 93.8 95.3  PLT 208 224 258 304 785   Basic Metabolic Panel: Recent Labs  Lab 02/19/19 0540 02/20/19 0315 02/20/19 0318 02/21/19 0330 02/22/19 0445 02/23/19 0538  NA 142  --  139 139 139 138  K 3.6  --  3.6 3.2* 4.0 4.6  CL 110  --  107 99 96* 98  CO2 17*  --  17* 21* 24 25  GLUCOSE 161*  --  148* 165* 157* 166*  BUN 29*  --  29* 35* 39* 41*  CREATININE 1.22*  --  1.14* 1.14* 1.09* 1.06*  CALCIUM 7.4*  --  7.4* 7.7* 8.3* 8.9  MG 1.4* 1.4*  --  1.5* 1.6* 1.7   GFR: Estimated Creatinine Clearance: 51.6 mL/min (A) (by C-G formula based on SCr of 1.06 mg/dL (H)). Liver Function Tests: Recent Labs  Lab 02/19/19 0540 02/20/19 0318 02/21/19 0330 02/22/19 0445 02/23/19 0538  AST 20 34 28 21 22   ALT 12 22 25 22 22   ALKPHOS 45 56 64 59 52  BILITOT 0.3 0.6 0.8 0.6 0.6  PROT 5.9* 6.2* 6.4* 6.3* 5.9*  ALBUMIN 2.7* 3.0* 3.0* 3.0* 2.9*   No results for input(s): LIPASE, AMYLASE in the  last 168 hours. No results for input(s): AMMONIA in the last 168 hours. Coagulation Profile: No results for input(s): INR, PROTIME in the last 168 hours. Cardiac Enzymes: No results for input(s): CKTOTAL, CKMB, CKMBINDEX, TROPONINI in the last 168 hours. BNP (last 3 results) No results for input(s): PROBNP in the last 8760 hours. HbA1C: Recent Labs    02/22/19 0503  HGBA1C 5.7*   CBG: Recent Labs  Lab 02/22/19 1916 02/23/19 0843 02/23/19 1128 02/23/19 1638  GLUCAP 228* 158* 159* 153*   Lipid Profile: No results for input(s): CHOL, HDL, LDLCALC, TRIG, CHOLHDL, LDLDIRECT in the last 72 hours. Thyroid Function Tests: No  results for input(s): TSH, T4TOTAL, FREET4, T3FREE, THYROIDAB in the last 72 hours. Anemia Panel: Recent Labs    02/22/19 0445 02/23/19 0539  FERRITIN 492* 497*   Sepsis Labs: Recent Labs  Lab 02/16/19 1821  LATICACIDVEN 1.7    Recent Results (from the past 240 hour(s))  Culture, blood (routine x 2)     Status: None   Collection Time: 02/16/19  5:59 PM   Specimen: BLOOD  Result Value Ref Range Status   Specimen Description   Final    BLOOD LEFT ANTECUBITAL Performed at [facility], [address] [place], [name] [address]    Special Requests   Final    BOTTLES DRAWN AEROBIC AND ANAEROBIC Blood Culture results may not be optimal due to an inadequate volume of blood received in culture bottles Performed at [facility], [address] [place], [name] [address]    Culture   Final    NO GROWTH 5 DAYS Performed at [facility], [place] [address], [place], [place] [address]    Report Status 02/21/2019 FINAL  Final  Culture, blood (routine x 2)     Status: None   Collection Time: 02/16/19  6:21 PM   Specimen: BLOOD  Result Value Ref Range Status   Specimen Description   Final    BLOOD RIGHT HAND Performed at [facility], [address] [place], [name] [address]    Special Requests   Final    BOTTLES  DRAWN AEROBIC ONLY Blood Culture results may not be optimal due to an inadequate volume of blood received in culture bottles Performed at [facility], [address] [place], [name] [address]    Culture   Final    NO GROWTH 5 DAYS Performed at [facility], [place] [address], [place], [place] [address]    Report Status 02/21/2019 FINAL  Final  Culture, Urine     Status: Abnormal   Collection Time: 02/17/19  9:27 AM   Specimen: Urine, Clean Catch  Result Value Ref Range Status   Specimen Description   Final    URINE, CLEAN CATCH Performed at [facility], [place] [address], [place],  [address]    Special Requests   Final    NONE Performed at [facility], [place] [address] [place],  [address]    Culture (A)  Final    >=100,000 COLONIES/mL ESCHERICHIA COLI 50,000 COLONIES/mL PROTEUS MIRABILIS    Report Status 02/19/2019 FINAL  Final   Organism ID, Bacteria ESCHERICHIA COLI (A)  Final   Organism ID, Bacteria PROTEUS MIRABILIS (A)  Final      Susceptibility   Escherichia coli - MIC*    AMPICILLIN <=2 SENSITIVE Sensitive     CEFAZOLIN <=4 SENSITIVE Sensitive     CEFTRIAXONE <=1 SENSITIVE Sensitive     CIPROFLOXACIN <=0.25 SENSITIVE Sensitive     GENTAMICIN <=1 SENSITIVE Sensitive     IMIPENEM <=0.25 SENSITIVE Sensitive     NITROFURANTOIN <=16 SENSITIVE Sensitive     TRIMETH/SULFA <=20 SENSITIVE Sensitive     AMPICILLIN/SULBACTAM <=2 SENSITIVE Sensitive     PIP/TAZO <=4 SENSITIVE Sensitive     Extended ESBL NEGATIVE Sensitive     * >=100,000 COLONIES/mL ESCHERICHIA COLI   Proteus mirabilis - MIC*    AMPICILLIN <=2 SENSITIVE Sensitive     CEFAZOLIN <=4 SENSITIVE Sensitive     CEFTRIAXONE <=1 SENSITIVE Sensitive  CIPROFLOXACIN <=0.25 SENSITIVE Sensitive     GENTAMICIN <=1 SENSITIVE Sensitive     IMIPENEM 4 SENSITIVE Sensitive     NITROFURANTOIN 128 RESISTANT Resistant     TRIMETH/SULFA <=20 SENSITIVE Sensitive      AMPICILLIN/SULBACTAM <=2 SENSITIVE Sensitive     PIP/TAZO <=4 SENSITIVE Sensitive     * 50,000 COLONIES/mL PROTEUS MIRABILIS         Radiology Studies: No results found.      Scheduled Meds: . desmopressin  0.3 mg Oral QHS  . heparin  5,000 Units Subcutaneous Q8H  . insulin aspart  0-5 Units Subcutaneous QHS  . insulin aspart  0-9 Units Subcutaneous TID WC  . losartan  50 mg Oral Daily  . Mesalamine  800 mg Oral BID  . methylPREDNISolone (SOLU-MEDROL) injection  0.5 mg/kg Intravenous Q12H  . mometasone-formoterol  2 puff Inhalation BID  . mycophenolate  1,000 mg Oral BID  . pantoprazole  40 mg Oral Daily  . pyridostigmine  30 mg Oral Q8H  . rOPINIRole  1 mg Oral QHS  . rosuvastatin  40 mg Oral Daily   Continuous Infusions: .  ceFAZolin (ANCEF) IV 1 g (02/23/19 1421)  . remdesivir 100 mg in NS 100 mL 100 mg (02/23/19 0921)     LOS: 7 days    Time spent: over 27 min    Fayrene Helper, MD [facility] Pager AMION  If 7PM-7AM, please contact night-coverage [email or web address] Password [email or web address] 02/23/2019, 6:10 PM

## 2019-02-23 NOTE — Progress Notes (Signed)
Pt denied this rn to call family/spouse for updates tonight. Pt states she has been in contact with her husband during the day today, no need. Phone at bedside.

## 2019-02-23 NOTE — Plan of Care (Signed)
Called Husband to inform of continued POC and patient status.  No further questions at this time.

## 2019-02-23 NOTE — Progress Notes (Signed)
RT unavailable to perform NIF with pt on 02/22/19.  RT will resume exercise on 02/23/19.

## 2019-02-24 LAB — CBC WITH DIFFERENTIAL/PLATELET
Abs Immature Granulocytes: 5.3 10*3/uL — ABNORMAL HIGH (ref 0.00–0.07)
Basophils Absolute: 0 10*3/uL (ref 0.0–0.1)
Basophils Relative: 0 %
Eosinophils Absolute: 0 10*3/uL (ref 0.0–0.5)
Eosinophils Relative: 0 %
HCT: 29.3 % — ABNORMAL LOW (ref 36.0–46.0)
Hemoglobin: 9.6 g/dL — ABNORMAL LOW (ref 12.0–15.0)
Immature Granulocytes: 19 %
Lymphocytes Relative: 9 %
Lymphs Abs: 2.5 10*3/uL (ref 0.7–4.0)
MCH: 32.2 pg (ref 26.0–34.0)
MCHC: 32.8 g/dL (ref 30.0–36.0)
MCV: 98.3 fL (ref 80.0–100.0)
Monocytes Absolute: 1.8 10*3/uL — ABNORMAL HIGH (ref 0.1–1.0)
Monocytes Relative: 7 %
Neutro Abs: 18.3 10*3/uL — ABNORMAL HIGH (ref 1.7–7.7)
Neutrophils Relative %: 65 %
Platelets: 327 10*3/uL (ref 150–400)
RBC: 2.98 MIL/uL — ABNORMAL LOW (ref 3.87–5.11)
RDW: 13.3 % (ref 11.5–15.5)
WBC: 28 10*3/uL — ABNORMAL HIGH (ref 4.0–10.5)
nRBC: 0.6 % — ABNORMAL HIGH (ref 0.0–0.2)

## 2019-02-24 LAB — GLUCOSE, CAPILLARY
Glucose-Capillary: 118 mg/dL — ABNORMAL HIGH (ref 70–99)
Glucose-Capillary: 196 mg/dL — ABNORMAL HIGH (ref 70–99)
Glucose-Capillary: 194 mg/dL — ABNORMAL HIGH (ref 70–99)
Glucose-Capillary: 174 mg/dL — ABNORMAL HIGH (ref 70–99)

## 2019-02-24 LAB — D-DIMER, QUANTITATIVE: D-Dimer, Quant: 0.57 ug/mL-FEU — ABNORMAL HIGH (ref 0.00–0.50)

## 2019-02-24 LAB — COMPREHENSIVE METABOLIC PANEL
ALT: 25 U/L (ref 0–44)
AST: 27 U/L (ref 15–41)
Albumin: 2.7 g/dL — ABNORMAL LOW (ref 3.5–5.0)
Alkaline Phosphatase: 52 U/L (ref 38–126)
Anion gap: 15 (ref 5–15)
BUN: 42 mg/dL — ABNORMAL HIGH (ref 6–20)
CO2: 26 mmol/L (ref 22–32)
Calcium: 9.1 mg/dL (ref 8.9–10.3)
Chloride: 98 mmol/L (ref 98–111)
Creatinine, Ser: 1.14 mg/dL — ABNORMAL HIGH (ref 0.44–1.00)
GFR calc Af Amer: >60 mL/min (ref >60)
GFR calc non Af Amer: 53 mL/min — ABNORMAL LOW (ref >60)
Glucose, Bld: 98 mg/dL (ref 70–99)
Potassium: 4 mmol/L (ref 3.5–5.1)
Sodium: 139 mmol/L (ref 135–145)
Total Bilirubin: 0.5 mg/dL (ref 0.3–1.2)
Total Protein: 5.7 g/dL — ABNORMAL LOW (ref 6.5–8.1)

## 2019-02-24 LAB — C-REACTIVE PROTEIN: CRP: 1.5 mg/dL — ABNORMAL HIGH (ref <1.0)

## 2019-02-24 LAB — MAGNESIUM: Magnesium: 1.7 mg/dL (ref 1.7–2.4)

## 2019-02-24 LAB — FERRITIN: Ferritin: 530 ng/mL — ABNORMAL HIGH (ref 11–307)

## 2019-02-24 MED ORDER — ENOXAPARIN SODIUM 40 MG/0.4ML ~~LOC~~ SOLN
40.0000 mg | SUBCUTANEOUS | Status: DC
  Administered 2019-02-24: 40 mg via SUBCUTANEOUS
  Filled 2019-02-24: qty 0.4

## 2019-02-24 MED ORDER — CEPHALEXIN 500 MG PO CAPS
500.0000 mg | ORAL_CAPSULE | Freq: Three times a day (TID) | ORAL | Status: DC
  Administered 2019-02-24 – 2019-02-25 (×3): 500 mg via ORAL
  Filled 2019-02-24 (×4): qty 1

## 2019-02-24 NOTE — Plan of Care (Signed)
  Problem: Education: Goal: Knowledge of risk factors and measures for prevention of condition will improve Outcome: Progressing   Problem: Coping: Goal: Psychosocial and spiritual needs will be supported Outcome: Progressing   Problem: Respiratory: Goal: Will maintain a patent airway Outcome: Progressing   

## 2019-02-24 NOTE — Progress Notes (Signed)
Pt did -30 on NIF

## 2019-02-24 NOTE — Plan of Care (Signed)
Called Husband to give update on POC and patient status.  Informed of continued progress and the strong likely-hood of being discharged tomorrow.  No further questions at this time.

## 2019-02-24 NOTE — Progress Notes (Signed)
Ok to change SQ heparin to lovenox since AKI has resolved and change Ancef to Keflex per Dr Florene Glen.   Onnie Boer, PharmD, BCIDP, AAHIVP, CPP Infectious Disease Pharmacist 02/24/2019 4:20 PM

## 2019-02-24 NOTE — Progress Notes (Signed)
Physical Therapy Treatment Patient Details Name: Teresa Lee MRN: [id number] DOB: [birth year] Today's Date: 02/24/2019    History of Present Illness Teresa Lee is a 58 y.o. female with medical history significant of myasthenia gravis, fibromyalgia, restless leg syndrome, Crohn's disease, hypertension, hyperlipidemia, presents to the hospital with chief complaint of weakness, poor p.o. intake, fatigue and generalized malaise.  She has been detected positive for coronavirus on 11/24 and has been trying to manage her symptoms at home.  She also been having nausea, vomiting and diarrhea    PT Comments    The patient ambulated x 150' on Ra with SPO2 99%, HR 88. Patient  Reports feeling SOB. Dyspnea 2-3/4. This is the farthest distance that  The patient  Has ambulated in 1 week. Patient  Does not require HH PT at this time. Reviewed ambulation goals and use oF flutter/IS when SOB post activity. Patient plans Dc home tomorrow.   Follow Up Recommendations  No PT follow up(provided info for HEP.)     Equipment Recommendations  None recommended by PT    Recommendations for Other Services       Precautions / Restrictions Precautions Precautions: Fall    Mobility  Bed Mobility Overal bed mobility: Independent                Transfers Overall transfer level: Independent                  Ambulation/Gait Ambulation/Gait assistance: Supervision;Min guard Gait Distance (Feet): 150 Feet Assistive device: None Gait Pattern/deviations: Step-through pattern     General Gait Details: gait steady but patient requests a HHA.  Patient is up ad lib in room by report.   Stairs             Wheelchair Mobility    Modified Rankin (Stroke Patients Only)       Balance Overall balance assessment: No apparent balance deficits (not formally assessed)                                          Cognition Arousal/Alertness: Awake/alert                                            Exercises      General Comments        Pertinent Vitals/Pain Pain Assessment: No/denies pain    Home Living                      Prior Function            PT Goals (current goals can now be found in the care plan section) Progress towards PT goals: Progressing toward goals    Frequency    Min 3X/week      PT Plan Current plan remains appropriate    Co-evaluation              AM-PAC PT "6 Clicks" Mobility   Outcome Measure  Help needed turning from your back to your side while in a flat bed without using bedrails?: None Help needed moving from lying on your back to sitting on the side of a flat bed without using bedrails?: None Help needed moving to and from a bed to a chair (including a wheelchair)?:  None Help needed standing up from a chair using your arms (e.g., wheelchair or bedside chair)?: None Help needed to walk in hospital room?: None Help needed climbing 3-5 steps with a railing? : A Little 6 Click Score: 23    End of Session   Activity Tolerance: Patient tolerated treatment well Patient left: in bed;with call bell/phone within reach Nurse Communication: Mobility status PT Visit Diagnosis: Unsteadiness on feet (R26.81)     Time: [id number] PT Time Calculation (min) (ACUTE ONLY): 26 min  Charges:  $Gait Training: 23-37 mins                     [place] Pager [phone] Office [phone]    Claretha Cooper 02/24/2019, 10:22 AM

## 2019-02-24 NOTE — Progress Notes (Signed)
RT unavailable to obtain NIF for 02/23/19 (PM).

## 2019-02-24 NOTE — Progress Notes (Signed)
**Note De-Identified vi Obfusction** PROGRESS NOTE    Teresa Lee  SLH:[id number] DOB: Sep 21, 1960 DO: 02/16/2019 PCP: Mrtinique, Betty G, MD   Brief Nrrtive:  Teresa Lee  58 y.o.femlewith medicl history significnt ofmystheni grvis, fibromylgi, restless leg syndrome, Crohn's disese, hypertension, hyperlipidemi, presents to the hospitl with chief complint of wekness, poor p.o. intke, ftigue nd generlized mlise. She hs been detected positive for coronvirus on 11/24nd hs been trying to mnge her symptoms t home. She lso been hving nuse, vomiting nd dirrhe. She ws brely ble to et nything in the lst few dys.   She hd no respirtory symptoms on dmission  ssessment & Pln:   ctive Problems:   KI (cute kidney injury) ([facility])   cute on chronic renl filure ([facility])   COVID-19  COVID 19 Virl Illness  pneumoni:  - negtive CXR on dmission - ws c/o poor PO intke, N/V/D t presenttion - continue SOB, but doing well on R - CT with evidence of pneumoni nd trce effusions - strted on remdesivir - will pln to complete this given her commorbidities, her steroids were incresed s noted below due to her being on chronic steroids with cute illness - dy 4 tody of remdesivir - will begin to tper steroids - echo with norml EF - she received lsix on 12/3 nd 12/4  COVID-19 Lbs  Recent Lbs    02/22/19 0445 02/23/19 0538 02/23/19 0539 02/24/19 0115  DDIMER 0.76* 0.63*  --  0.57*  FERRITIN 492*  --  497* 530*  CRP 1.9*  --  1.7* 1.5*    Lb Results  Component Vlue Dte   SRSCOV2N Detected () 02/11/2019    Urinry Trct Infection  E. Coli nd Proteus UTI: - question of whether this ws true UTI in prior notes, but given her generlized mlise, currently treting with ceftrixone -> will nrrow to ncef while inptient - pln for 7 totl dys tretment - Blood cx NGTD  Mystheni grvis -Monitor NIFs, stble, continue home mediction with  CellCept, Mestinon  -On chronic steroids, use higher dose given cute illness  cute Kidney Injury: bseline cretinine is <1.  Improving t this time.  Hypertension: losrtn restrted, follow BP  Crohn's Disese: continue meslmine  RLS: requip  Hyperlipidemi: continue sttin  GERD: continue PPI  nxiety: continue xnx  Nocturnl Polyuri: continue DDVP  Leukocytosis: likely 2/2 steroids - still rising, will follow - febrile  NGM: 2/2 NS resuscittion, continue to follow  Hypomgnesemi: replce nd follow - replce orlly, cution with MG  Hypoklemi: replce nd follow  Predibetes  Steroid Induced Hyperglycemi: ssi, follow - follow outptient  DVT prophylxis: heprin  Code Sttus: full  Fmily Communiction: none t bedside - clled husbnd, 12/3 - clled 12/4, no nswer - 12/5 discussed with husbnd Disposition Pln:pending further improvement  Consultnts:   none  Procedures:  none  ntimicrobils:  nti-infectives (From dmission, onwrd)   Strt     Dose/Rte Route Frequency Ordered Stop   02/24/19 1615  cephLEXin (KEFLEX) cpsule 500 mg     500 mg Orl Every 8 hours 02/24/19 1614 02/25/19 2159   02/22/19 1000  remdesivir 100 mg in sodium chloride 0.9 % 100 mL IVPB     100 mg 200 mL/hr over 30 Minutes Intrvenous Dily 02/21/19 1907 02/26/19 0959   02/21/19 2000  remdesivir 200 mg in sodium chloride 0.9% 250 mL IVPB     200 mg 580 mL/hr over 30 Minutes Intrvenous Once 02/21/19 1907 02/21/19 2215   02/19/19 1500  ceFZolin (NCEF) IVPB 1 g/50 mL premix  Status:  Discontinued     1 g 100 mL/hr over 30 Minutes Intravenous Every 8 hours 02/19/19 1410 02/24/19 1614   02/18/19 1700  cefTRIXone (ROCEPHIN) 1 g in sodium chloride 0.9 % 100 mL IVPB  Status:  Discontinued     1 g 200 mL/hr over 30 Minutes Intravenous Every 24 hours 02/18/19 1623 02/19/19 1410     Subjective: Still feels tired, some SOB with exertion  Objective: Vitals:    02/23/19 2022 02/24/19 0450 02/24/19 0700 02/24/19 1700  BP: (!) 150/74 (!) 143/80 131/76 114/71  Pulse: 67 60 62 81  Resp: 19 16    Temp: 97.9 F (36.6 C) 98.2 F (36.8 C) 98.2 F (36.8 C) 97.6 F (36.4 C)  TempSrc: Oral Oral Oral Oral  SpO2: 97% 95% 98% 97%  Weight:      Height:        Intake/Output Summary (Last 24 hours) at 02/24/2019 1756 Last data filed at 02/23/2019 2000 Gross per 24 hour  Intake 698.88 ml  Output -  Net 698.88 ml   Filed Weights   02/16/19 1521  Weight: 67.7 kg    Examination:  General: No acute distress. Cardiovascular: RRR. Lungs: unlabored bdomen: Soft, nontender, nondistended  Neurological: lert and oriented 3. Moves all extremities 4 . Cranial nerves II through XII grossly intact. Skin: Warm and dry. No rashes or lesions. Extremities: No clubbing or cyanosis. No edema.   Data Reviewed: I have personally reviewed following labs and imaging studies  CBC: Recent Labs  Lab 02/20/19 0318 02/21/19 0330 02/22/19 0445 02/23/19 0538 02/24/19 0115  WBC 21.0* 20.4* 21.4* 23.0* 28.0*  NEUTROBS 17.4* 14.7* 15.6* 20.0* 18.3*  HGB 8.8* 9.4* 9.8* 9.7* 9.6*  HCT 26.5* 26.8* 28.8* 28.7* 29.3*  MCV 96.7 93.1 93.8 95.3 98.3  PLT 224 258 304 325 845   Basic Metabolic Panel: Recent Labs  Lab 02/20/19 0315 02/20/19 0318 02/21/19 0330 02/22/19 0445 02/23/19 0538 02/24/19 0115  N  --  139 139 139 138 139  K  --  3.6 3.2* 4.0 4.6 4.0  CL  --  107 99 96* 98 98  CO2  --  17* 21* 24 25 26   GLUCOSE  --  148* 165* 157* 166* 98  BUN  --  29* 35* 39* 41* 42*  CRETININE  --  1.14* 1.14* 1.09* 1.06* 1.14*  CLCIUM  --  7.4* 7.7* 8.3* 8.9 9.1  MG 1.4*  --  1.5* 1.6* 1.7 1.7   GFR: Estimated Creatinine Clearance: 48 mL/min () (by C-G formula based on SCr of 1.14 mg/dL (H)). Liver Function Tests: Recent Labs  Lab 02/20/19 0318 02/21/19 0330 02/22/19 0445 02/23/19 0538 02/24/19 0115  ST 34 28 21 22 27   LT 22 25 22 22 25   LKPHOS 56  64 59 52 52  BILITOT 0.6 0.8 0.6 0.6 0.5  PROT 6.2* 6.4* 6.3* 5.9* 5.7*  LBUMIN 3.0* 3.0* 3.0* 2.9* 2.7*   No results for input(s): LIPSE, MYLSE in the last 168 hours. No results for input(s): MMONI in the last 168 hours. Coagulation Profile: No results for input(s): INR, PROTIME in the last 168 hours. Cardiac Enzymes: No results for input(s): CKTOTL, CKMB, CKMBINDEX, TROPONINI in the last 168 hours. BNP (last 3 results) No results for input(s): PROBNP in the last 8760 hours. Hb1C: Recent Labs    02/22/19 0503  HGB1C 5.7*   CBG: Recent Labs  Lab 02/23/19 1638 02/23/19 2026  02/24/19 0842 02/24/19 1154 02/24/19 1725  GLUCP 153* 226* 118* 196* 194*   Lipid Profile: No results for input(s): CHOL, HDL, LDLCLC, TRIG, CHOLHDL, LDLDIRECT in the last 72 hours. Thyroid Function Tests: No results for input(s): TSH, T4TOTL, FREET4, T3FREE, THYROIDB in the last 72 hours. nemia Panel: Recent Labs    02/23/19 0539 02/24/19 0115  FERRITIN 497* 530*   Sepsis Labs: No results for input(s): PROCLCITON, LTICCIDVEN in the last 168 hours.  Recent Results (from the past 240 hour(s))  Culture, blood (routine x 2)     Status: None   Collection Time: 02/16/19  5:59 PM   Specimen: BLOOD  Result Value Ref Range Status   Specimen Description   Final    BLOOD LEFT NTECUBITL Performed at [facility], [address] [place], [place] [address]    Special Requests   Final    BOTTLES DRWN EROBIC ND NEROBIC Blood Culture results may not be optimal due to an inadequate volume of blood received in culture bottles Performed at [facility], [address] [place], [place] [address]    Culture   Final    NO GROWTH 5 DYS Performed at [facility], [place] [address] [place], [place] [address]    Report Status 02/21/2019 FINL  Final  Culture, blood (routine x 2)     Status: None   Collection Time: 02/16/19  6:21 PM   Specimen: BLOOD  Result  Value Ref Range Status   Specimen Description   Final    BLOOD RIGHT HND Performed at [facility], [address] [place], [place] [address]    Special Requests   Final    BOTTLES DRWN EROBIC ONLY Blood Culture results may not be optimal due to an inadequate volume of blood received in culture bottles Performed at [facility], [address] [place], [place] [address]    Culture   Final    NO GROWTH 5 DYS Performed at [facility], [place] [address] [place], [place] [address]    Report Status 02/21/2019 FINL  Final  Culture, Urine     Status: bnormal   Collection Time: 02/17/19  9:27 M   Specimen: Urine, Clean Catch  Result Value Ref Range Status   Specimen Description   Final    URINE, CLEN CTCH Performed at [facility], [place] [address] [place], [place] [address]    Special Requests   Final    NONE Performed at [facility], [place] [address] [place],  [address]    Culture ()  Final    >=100,000 COLONIES/mL ESCHERICHI COLI 50,000 COLONIES/mL PROTEUS MIRBILIS    Report Status 02/19/2019 FINL  Final   Organism ID, Bacteria ESCHERICHI COLI ()  Final   Organism ID, Bacteria PROTEUS MIRBILIS ()  Final      Susceptibility   Escherichia coli - MIC*    MPICILLIN <=2 SENSITIVE Sensitive     CEFZOLIN <=4 SENSITIVE Sensitive     CEFTRIXONE <=1 SENSITIVE Sensitive     CIPROFLOXCIN <=0.25 SENSITIVE Sensitive     GENTMICIN <=1 SENSITIVE Sensitive     IMIPENEM <=0.25 SENSITIVE Sensitive     NITROFURNTOIN <=16 SENSITIVE Sensitive     TRIMETH/SULF <=20 SENSITIVE Sensitive     MPICILLIN/SULBCTM <=2 SENSITIVE Sensitive     PIP/TZO <=4 SENSITIVE Sensitive     Extended ESBL NEGTIVE Sensitive     * >=  100,000 COLONIES/mL ESCHERICHIA COLI   Proteus mirabilis - MIC*    AMPICILLIN <=2 SENSITIVE Sensitive     CEFAZOLIN <=4 SENSITIVE Sensitive     CEFTRIAXONE <=1 SENSITIVE Sensitive      CIPROFLOXACIN <=0.25 SENSITIVE Sensitive     GENTAMICIN <=1 SENSITIVE Sensitive     IMIPENEM 4 SENSITIVE Sensitive     NITROFURANTOIN 128 RESISTANT Resistant     TRIMETH/SULFA <=20 SENSITIVE Sensitive     AMPICILLIN/SULBACTAM <=2 SENSITIVE Sensitive     PIP/TAZO <=4 SENSITIVE Sensitive     * 50,000 COLONIES/mL PROTEUS MIRABILIS         Radiology Studies: No results found.      Scheduled Meds: . cephALEXin  500 mg Oral Q8H  . desmopressin  0.3 mg Oral QHS  . enoxaparin (LOVENOX) injection  40 mg Subcutaneous Q24H  . insulin aspart  0-5 Units Subcutaneous QHS  . insulin aspart  0-9 Units Subcutaneous TID WC  . losartan  50 mg Oral Daily  . Mesalamine  800 mg Oral BID  . mometasone-formoterol  2 puff Inhalation BID  . mycophenolate  1,000 mg Oral BID  . pantoprazole  40 mg Oral Daily  . predniSONE  40 mg Oral Q breakfast  . pyridostigmine  30 mg Oral Q8H  . rOPINIRole  1 mg Oral QHS  . rosuvastatin  40 mg Oral Daily   Continuous Infusions: . remdesivir 100 mg in NS 100 mL 100 mg (02/24/19 0907)     LOS: 8 days    Time spent: over 30 min    Fayrene Helper, MD [facility] Pager AMION  If 7PM-7AM, please contact night-coverage [email or web address] Password [facility] 02/24/2019, 5:56 PM

## 2019-02-25 LAB — CBC WITH DIFFERENTIAL/PLATELET
Abs Immature Granulocytes: 5.2 10*3/uL — ABNORMAL HIGH (ref 0.00–0.07)
Basophils Absolute: 0 10*3/uL (ref 0.0–0.1)
Basophils Relative: 0 %
Eosinophils Absolute: 0.3 10*3/uL (ref 0.0–0.5)
Eosinophils Relative: 1 %
HCT: 30.9 % — ABNORMAL LOW (ref 36.0–46.0)
Hemoglobin: 10.1 g/dL — ABNORMAL LOW (ref 12.0–15.0)
Lymphocytes Relative: 12 %
Lymphs Abs: 3.1 10*3/uL (ref 0.7–4.0)
MCH: 32.1 pg (ref 26.0–34.0)
MCHC: 32.7 g/dL (ref 30.0–36.0)
MCV: 98.1 fL (ref 80.0–100.0)
Metamyelocytes Relative: 4 %
Monocytes Absolute: 1.6 10*3/uL — ABNORMAL HIGH (ref 0.1–1.0)
Monocytes Relative: 6 %
Myelocytes: 16 %
Neutro Abs: 15.9 10*3/uL — ABNORMAL HIGH (ref 1.7–7.7)
Neutrophils Relative %: 61 %
Platelets: 298 10*3/uL (ref 150–400)
RBC: 3.15 MIL/uL — ABNORMAL LOW (ref 3.87–5.11)
RDW: 14.2 % (ref 11.5–15.5)
WBC: 26 10*3/uL — ABNORMAL HIGH (ref 4.0–10.5)
nRBC: 0.5 % — ABNORMAL HIGH (ref 0.0–0.2)

## 2019-02-25 LAB — MAGNESIUM: Magnesium: 1.6 mg/dL — ABNORMAL LOW (ref 1.7–2.4)

## 2019-02-25 LAB — COMPREHENSIVE METABOLIC PANEL
ALT: 25 U/L (ref 0–44)
AST: 21 U/L (ref 15–41)
Albumin: 2.8 g/dL — ABNORMAL LOW (ref 3.5–5.0)
Alkaline Phosphatase: 50 U/L (ref 38–126)
Anion gap: 12 (ref 5–15)
BUN: 37 mg/dL — ABNORMAL HIGH (ref 6–20)
CO2: 29 mmol/L (ref 22–32)
Calcium: 8.8 mg/dL — ABNORMAL LOW (ref 8.9–10.3)
Chloride: 96 mmol/L — ABNORMAL LOW (ref 98–111)
Creatinine, Ser: 1.03 mg/dL — ABNORMAL HIGH (ref 0.44–1.00)
GFR calc Af Amer: >60 mL/min (ref >60)
GFR calc non Af Amer: >60 mL/min (ref >60)
Glucose, Bld: 85 mg/dL (ref 70–99)
Potassium: 3.6 mmol/L (ref 3.5–5.1)
Sodium: 137 mmol/L (ref 135–145)
Total Bilirubin: 1 mg/dL (ref 0.3–1.2)
Total Protein: 5.7 g/dL — ABNORMAL LOW (ref 6.5–8.1)

## 2019-02-25 LAB — GLUCOSE, CAPILLARY
Glucose-Capillary: 76 mg/dL (ref 70–99)
Glucose-Capillary: 199 mg/dL — ABNORMAL HIGH (ref 70–99)

## 2019-02-25 LAB — FERRITIN: Ferritin: 444 ng/mL — ABNORMAL HIGH (ref 11–307)

## 2019-02-25 LAB — C-REACTIVE PROTEIN: CRP: 1.6 mg/dL — ABNORMAL HIGH (ref <1.0)

## 2019-02-25 LAB — D-DIMER, QUANTITATIVE: D-Dimer, Quant: 0.68 ug/mL-FEU — ABNORMAL HIGH (ref 0.00–0.50)

## 2019-02-25 MED ORDER — ONDANSETRON HCL 4 MG PO TABS: 4.0000 mg | ORAL_TABLET | Freq: Three times a day (TID) | ORAL | 0 refills | Status: DC | PRN

## 2019-02-25 MED ORDER — PREDNISONE 20 MG PO TABS: ORAL_TABLET | ORAL | 0 refills | Status: AC

## 2019-02-25 NOTE — Plan of Care (Signed)
  Problem: Respiratory: Goal: Will maintain a patent airway Outcome: Progressing Goal: Complications related to the disease process, condition or treatment will be avoided or minimized Outcome: Progressing   

## 2019-02-25 NOTE — Progress Notes (Signed)
NIF >-40.  Patient has good effort.  No complications noted.

## 2019-02-25 NOTE — Discharge Instructions (Signed)
Your quarantine will end on December 15th as long as you remain asymptomatic without medications.  Call the health department with questions.     Person Under Monitoring Name: Teresa Lee  Location: [name] [address]   Infection Prevention Recommendations for Individuals Confirmed to have, or Being Evaluated for, 2019 Novel Coronavirus (COVID-19) Infection Who Receive Care at Home  Individuals who are confirmed to have, or are being evaluated for, COVID-19 should follow the prevention steps below until Chiante Peden healthcare provider or local or state health department says they can return to normal activities.  Stay home except to get medical care You should restrict activities outside your home, except for getting medical care. Do not go to work, school, or public areas, and do not use public transportation or taxis.  Call ahead before visiting your doctor Before your medical appointment, call the healthcare provider and tell them that you have, or are being evaluated for, COVID-19 infection. This will help the healthcare providers office take steps to keep other people from getting infected. Ask your healthcare provider to call the local or state health department.  Monitor your symptoms Seek prompt medical attention if your illness is worsening (e.g., difficulty breathing). Before going to your medical appointment, call the healthcare provider and tell them that you have, or are being evaluated for, COVID-19 infection. Ask your healthcare provider to call the local or state health department.  Wear Ridhi Hoffert facemask You should wear Charna Neeb facemask that covers your nose and mouth when you are in the same room with other people and when you visit Deepa Barthel healthcare provider. People who live with or visit you should also wear Glennie Bose facemask while they are in the same room with you.  Separate yourself from other people in your home As much as possible, you should stay in Tassie Pollett different  room from other people in your home. Also, you should use Andoni Busch separate bathroom, if available.  Avoid sharing household items You should not share dishes, drinking glasses, cups, eating utensils, towels, bedding, or other items with other people in your home. After using these items, you should wash them thoroughly with soap and water.  Cover your coughs and sneezes Cover your mouth and nose with Icela Glymph tissue when you cough or sneeze, or you can cough or sneeze into your sleeve. Throw used tissues in Redding Cloe lined trash can, and immediately wash your hands with soap and water for at least 20 seconds or use an alcohol-based hand rub.  Wash your [facility] your hands often and thoroughly with soap and water for at least 20 seconds. You can use an alcohol-based hand sanitizer if soap and water are not available and if your hands are not visibly dirty. Avoid touching your eyes, nose, and mouth with unwashed hands.   Prevention Steps for Caregivers and Household Members of Individuals Confirmed to have, or Being Evaluated for, COVID-19 Infection Being Cared for in the Home  If you live with, or provide care at home for, Maclean Foister person confirmed to have, or being evaluated for, COVID-19 infection please follow these guidelines to prevent infection:  Follow healthcare providers instructions Make sure that you understand and can help the patient follow any healthcare provider instructions for all care.  Provide for the patients basic needs You should help the patient with basic needs in the home and provide support for getting groceries, prescriptions, and other personal needs.  Monitor the patients symptoms If they are getting sicker, call his or  her medical provider and tell them that the patient has, or is being evaluated for, COVID-19 infection. This will help the healthcare providers office take steps to keep other people from getting infected. Ask the healthcare provider to call the local or state  health department.  Limit the number of people who have contact with the patient  If possible, have only one caregiver for the patient.  Other household members should stay in another home or place of residence. If this is not possible, they should stay  in another room, or be separated from the patient as much as possible. Use Rosalie Buenaventura separate bathroom, if available.  Restrict visitors who do not have an essential need to be in the home.  Keep older adults, very young children, and other sick people away from the patient Keep older adults, very young children, and those who have compromised immune systems or chronic health conditions away from the patient. This includes people with chronic heart, lung, or kidney conditions, diabetes, and cancer.  Ensure good ventilation Make sure that shared spaces in the home have good air flow, such as from an air conditioner or an opened window, weather permitting.  Wash your hands often  Wash your hands often and thoroughly with soap and water for at least 20 seconds. You can use an alcohol based hand sanitizer if soap and water are not available and if your hands are not visibly dirty.  Avoid touching your eyes, nose, and mouth with unwashed hands.  Use disposable paper towels to dry your hands. If not available, use dedicated cloth towels and replace them when they become wet.  Wear Krayton Wortley facemask and gloves  Wear Minh Roanhorse disposable facemask at all times in the room and gloves when you touch or have contact with the patients blood, body fluids, and/or secretions or excretions, such as sweat, saliva, sputum, nasal mucus, vomit, urine, or feces.  Ensure the mask fits over your nose and mouth tightly, and do not touch it during use.  Throw out disposable facemasks and gloves after using them. Do not reuse.  Wash your hands immediately after removing your facemask and gloves.  If your personal clothing becomes contaminated, carefully remove clothing and  launder. Wash your hands after handling contaminated clothing.  Place all used disposable facemasks, gloves, and other waste in Valree Feild lined container before disposing them with other household waste.  Remove gloves and wash your hands immediately after handling these items.  Do not share dishes, glasses, or other household items with the patient  Avoid sharing household items. You should not share dishes, drinking glasses, cups, eating utensils, towels, bedding, or other items with Devere Brem patient who is confirmed to have, or being evaluated for, COVID-19 infection.  After the person uses these items, you should wash them thoroughly with soap and water.  Wash laundry thoroughly  Immediately remove and wash clothes or bedding that have blood, body fluids, and/or secretions or excretions, such as sweat, saliva, sputum, nasal mucus, vomit, urine, or feces, on them.  Wear gloves when handling laundry from the patient.  Read and follow directions on labels of laundry or clothing items and detergent. In general, wash and dry with the warmest temperatures recommended on the label.  Clean all areas the individual has used often  Clean all touchable surfaces, such as counters, tabletops, doorknobs, bathroom fixtures, toilets, phones, keyboards, tablets, and bedside tables, every day. Also, clean any surfaces that may have blood, body fluids, and/or secretions or excretions on them.  Wear gloves when cleaning surfaces the patient has come in contact with.  Use Kenyatte Chatmon diluted bleach solution (e.g., dilute bleach with 1 part bleach and 10 parts water) or Liat Mayol household disinfectant with Deklyn Trachtenberg label that says EPA-registered for coronaviruses. To make Keyera Hattabaugh bleach solution at home, add 1 tablespoon of bleach to 1 quart (4 cups) of water. For Krupa Stege larger supply, add  cup of bleach to 1 gallon (16 cups) of water.  Read labels of cleaning products and follow recommendations provided on product labels. Labels contain instructions for  safe and effective use of the cleaning product including precautions you should take when applying the product, such as wearing gloves or eye protection and making sure you have good ventilation during use of the product.  Remove gloves and wash hands immediately after cleaning.  Monitor yourself for signs and symptoms of illness Caregivers and household members are considered close contacts, should monitor their health, and will be asked to limit movement outside of the home to the extent possible. Follow the monitoring steps for close contacts listed on the symptom monitoring form.   ? If you have additional questions, contact your local health department or call the epidemiologist on call at [phone] (available 24/7). ? This guidance is subject to change. For the most up-to-date guidance from [facility], please refer to their website: [email or web address]

## 2019-02-25 NOTE — Progress Notes (Signed)
RT unavailable to perform NIF.

## 2019-02-25 NOTE — Progress Notes (Signed)
Discharge instructions completed with pt.  Pt verbalized understanding of the information.  Pt denies chest pain, shortness of breath, dizziness, lightheadedness, and n/v.  Pt's midline discontinued.  Pt waiting on transportation.

## 2019-02-25 NOTE — Discharge Summary (Signed)
Physician Discharge Summary  Teresa Lee KDT:[id number] DOB: 08-13-1960 DOA: 02/16/2019  PCP: [place], Betty G, MD  Admit date: 02/16/2019 Discharge date: 02/25/2019  Time spent: 40 minutes  Recommendations for Outpatient Follow-up:  1. Follow outpatient CBC/CMP 2. Quarantine per [employer] guidelines  3. Steroid taper 4. CXR outpatient  Discharge Diagnoses:  Active Problems:   AKI (acute kidney injury) ([place])   Acute on chronic renal failure ([place])   COVID-19   Discharge Condition: stable  Diet recommendation: heart healthy  Filed Weights   02/16/19 1521  Weight: 67.7 kg    History of present illness:  Teresa Lee 58 y.o.femalewith medical history significant ofmyasthenia gravis, fibromyalgia, restless leg syndrome, Crohn's disease, hypertension, hyperlipidemia, presents to the hospital with chief complaint of weakness, poor p.o. intake, fatigue and generalized malaise. She has been detected positive for coronavirus on 11/24and has been trying to manage her symptoms at home. She also been having nausea, vomiting and diarrhea. She was barely able to eat anything in the last few days.She had no respiratory symptoms on admission.  She was admitted for COVID 19 virus infection.  She received remdesivir and her home steroids were increased.  She was discharged on 12/8 with improved symptoms.  Hospital Course:  COVID 19 Viral Illness  pneumonia:  - negative CXR on admission - was c/o poor PO intake, N/V/D at presentation - continue SOB, but doing well on RA - CT with evidence of pneumonia and trace effusions - started on remdesivir - will plan to complete this given her commorbidities, her steroids were increased as noted below due to her being on chronic steroids with acute illness - has completed remdesivir, home with steroid taper - echo with normal EF - she received lasix on 12/3 and 12/4  COVID-19 Labs  Recent Labs    02/24/19 0115 02/25/19 0600   DDIMER 0.57* 0.68*  FERRITIN 530* 444*  CRP 1.5* 1.6*    Lab Results  Component Value Date   SARSCOV2NAA Detected (Teresa Lee) 02/11/2019     Urinary Tract Infection  E. Coli and Proteus UTI: - completed course of abx - Blood cx NGTD  Myasthenia gravis -MonitorNIFs, stable, continue home medication with CellCept, Mestinon -On chronic steroids, use higher dose given acute illness  Acute Kidney Injury: baseline creatinine is <1.  Improving at this time.  Hypertension: losartan restarted, follow BP  Crohn's Disease: continue mesalamine  RLS: requip  Hyperlipidemia: continue statin  GERD: continue PPI  Anxiety: continue xanax  Nocturnal Polyuria: continue DDAVP  Leukocytosis: likely 2/2 steroids - still rising, will follow - afebrile  NAGMA: 2/2 NS resuscitation, continue to follow  Hypomagnesemia: replace and follow - replace orally, caution with MG  Hypokalemia: replace and follow  Prediabetes  Steroid Induced Hyperglycemia: ssi, follow - follow outpatient  Procedures:  none  Consultations:  none  Discharge Exam: Vitals:   02/25/19 0425 02/25/19 0759  BP: 122/73 123/73  Pulse: 61 76  Resp: 17 16  Temp: 98 F (36.7 C) 98.3 F (36.8 C)  SpO2: 97% 96%   Eager for discharge home Feels better, less sob  General: No acute distress. Cardiovascular: Heart sounds show Teresa Lee regular rate, and rhythm.  Lungs: Clear to auscultation bilaterally  Abdomen: Soft, nontender, nondistended  Neurological: Alert and oriented 3. Moves all extremities 4 . Cranial nerves II through XII grossly intact. Skin: Warm and dry. No rashes or lesions. Extremities: No clubbing or cyanosis. No edema.   Discharge Instructions   Discharge Instructions  Call MD for:  difficulty breathing, headache or visual disturbances   Complete by: As directed    Call MD for:  extreme fatigue   Complete by: As directed    Call MD for:  persistant dizziness or  light-headedness   Complete by: As directed    Call MD for:  persistant nausea and vomiting   Complete by: As directed    Call MD for:  redness, tenderness, or signs of infection (pain, swelling, redness, odor or green/yellow discharge around incision site)   Complete by: As directed    Call MD for:  severe uncontrolled pain   Complete by: As directed    Call MD for:  temperature >100.4   Complete by: As directed    Diet - low sodium heart healthy   Complete by: As directed    Discharge instructions   Complete by: As directed    You were seen for COVID 19 pneumonia.  You were treated with remdesivir and steroids.  You've improved.  You should continue your quarantine for another 7 days (21 days since you were diagnosed).  You can discontinue your quarantine on 12/15 as long as you've remained symptom free for 24 hours without meds.  You were also treated for Teresa Lee UTI.    Please follow up with your PCP outpatient after quarantine.  We are discharging you with Teresa Lee steroid taper.  After you complete this, resume your home 10 mg prednisone daily.  Return for new, recurrent, or worsening symptoms.  Please ask your PCP to request records from this hospitalization so they know what was done and what the next steps will be.   Increase activity slowly   Complete by: As directed      Allergies as of 02/25/2019      Reactions   Latex Other (See Comments)   BLISTER   Lorazepam    Other reaction(s): Hallucinations   Percocet [oxycodone-acetaminophen] Hives   Sulfa Antibiotics Hives   Sulfamethoxazole-trimethoprim Hives   Tapentadol Hcl Other (See Comments)   HEADACHES NUCENTA   Nucynta [tapentadol]    Headache    Orange Concentrate [flavoring Agent]    Acid reflux   Other Other (See Comments), Hives, Nausea Only   Some nuts cause Teresa Lee rash   Tomato    Burns skin   Bioflavonoids Rash   Causes burning of skin and blisters   Cefixime    Due to myasthenia gravis      Medication List     STOP taking these medications   meloxicam 15 MG tablet Commonly known as: MOBIC     TAKE these medications   AeroChamber Plus inhaler Use as instructed to use with inahaler.   albuterol (2.5 MG/3ML) 0.083% nebulizer solution Commonly known as: PROVENTIL Take 2.5 mg by nebulization every 6 (six) hours as needed for wheezing or shortness of breath.   albuterol 108 (90 Base) MCG/ACT inhaler Commonly known as: Proventil HFA Inhale 1 puff into the lungs every 6 (six) hours as needed for wheezing or shortness of breath.   ALPRAZolam 0.5 MG tablet Commonly known as: XANAX Take 0.5 mg by mouth 3 (three) times daily as needed for anxiety or sleep (usually one tab at bedtime , if needed).   budesonide-formoterol 80-4.5 MCG/ACT inhaler Commonly known as: SYMBICORT Inhale 2 puffs into the lungs 2 (two) times daily.   desmopressin 0.1 MG tablet Commonly known as: DDAVP Take 0.3 mg by mouth daily.   estradiol 2 MG tablet Commonly known as: ESTRACE  Take 2 mg by mouth at bedtime.   HYDROcodone-acetaminophen 10-325 MG tablet Commonly known as: NORCO Take 1-2 tablets by mouth every 6 (six) hours as needed for moderate pain (total 3 tabs daily).   losartan 50 MG tablet Commonly known as: COZAAR TAKE 1 TABLET DAILY   Mesalamine 400 MG Cpdr DR capsule Commonly known as: ASACOL Take 800 mg by mouth 2 (two) times daily.   methocarbamol 750 MG tablet Commonly known as: ROBAXIN Take 750 mg by mouth at bedtime as needed for muscle spasms.   mycophenolate 500 MG tablet Commonly known as: CELLCEPT Take 1,000 mg by mouth 2 (two) times daily.   omega-3 acid ethyl esters 1 g capsule Commonly known as: Lovaza Take 1 capsule (1 g total) by mouth 2 (two) times daily.   ondansetron 4 MG tablet Commonly known as: ZOFRAN Take 1 tablet (4 mg total) by mouth every 8 (eight) hours as needed for nausea or vomiting. What changed:   when to take this  reasons to take this   pantoprazole 40  MG tablet Commonly known as: PROTONIX TAKE 1 TABLET DAILY   potassium chloride SA 20 MEQ tablet Commonly known as: KLOR-CON Take 1/2 TO 1 TABLET DAILY What changed: See the new instructions.   predniSONE 10 MG tablet Commonly known as: DELTASONE Take 10 mg by mouth daily. What changed: Another medication with the same name was added. Make sure you understand how and when to take each.   predniSONE 20 MG tablet Commonly known as: DELTASONE Take 2 tablets (40 mg total) by mouth daily with breakfast for 1 day, THEN 1.5 tablets (30 mg total) daily with breakfast for 3 days, THEN 1 tablet (20 mg total) daily with breakfast for 3 days. And then resume home 10 mg dose. Start taking on: February 26, 2019 What changed: You were already taking Taleisha Kaczynski medication with the same name, and this prescription was added. Make sure you understand how and when to take each.   progesterone 100 MG capsule Commonly known as: PROMETRIUM Take 100 mg by mouth at bedtime.   pyridostigmine 60 MG tablet Commonly known as: MESTINON Take 30 mg by mouth 3 (three) times daily. Take 1/2 tablet 3 times per day   rOPINIRole 1 MG tablet Commonly known as: REQUIP TAKE 1 OR 2 TABLETS AT BEDTIME What changed: See the new instructions.   rosuvastatin 40 MG tablet Commonly known as: Crestor Take 1 tablet (40 mg total) by mouth daily.   triamterene-hydrochlorothiazide 37.5-25 MG capsule Commonly known as: DYAZIDE TAKE (1) CAPSULE DAILY IN THE MORNING. What changed: See the new instructions.   vitamin B-12 1000 MCG tablet Commonly known as: CYANOCOBALAMIN Take 1,000 mcg by mouth daily.   cyanocobalamin 1000 MCG/ML injection Commonly known as: (VITAMIN B-12) Inject 1,000 mcg into the muscle every 30 (thirty) days.   Vitamin D (Ergocalciferol) 1.25 MG (50000 UT) Caps capsule Commonly known as: DRISDOL TAKE 1 CAPSULE EVERY 2 WEEKS      Allergies  Allergen Reactions  . Latex Other (See Comments)    BLISTER  .  Lorazepam     Other reaction(s): Hallucinations  . Percocet [Oxycodone-Acetaminophen] Hives  . Sulfa Antibiotics Hives  . Sulfamethoxazole-Trimethoprim Hives  . Tapentadol Hcl Other (See Comments)    HEADACHES NUCENTA  . Nucynta [Tapentadol]     Headache   . Orange Concentrate [Flavoring Agent]     Acid reflux  . Other Other (See Comments), Hives and Nausea Only    Some nuts  cause Teresa Lee rash  . Tomato     Burns skin  . Bioflavonoids Rash    Causes burning of skin and blisters  . Cefixime     Due to myasthenia gravis      The results of significant diagnostics from this hospitalization (including imaging, microbiology, ancillary and laboratory) are listed below for reference.    Significant Diagnostic Studies: Ct Angio Chest Pe W Or Wo Contrast  Result Date: 02/21/2019 CLINICAL DATA:  Shortness of breath. COVID-19 infection. EXAM: CT ANGIOGRAPHY CHEST WITH CONTRAST TECHNIQUE: Multidetector CT imaging of the chest was performed using the standard protocol during bolus administration of intravenous contrast. Multiplanar CT image reconstructions and MIPs were obtained to evaluate the vascular anatomy. CONTRAST:  32m OMNIPAQUE IOHEXOL 350 MG/ML SOLN COMPARISON:  Radiographs 02/21/2019 and 02/20/2019. CT 09/05/2017. FINDINGS: Cardiovascular: The pulmonary arteries are well opacified with contrast to the level of the subsegmental branches. There is no evidence of acute pulmonary embolism. No significant systemic arterial abnormalities. The heart size is normal. There is no pericardial effusion. Mediastinum/Nodes: There are no enlarged mediastinal, hilar or axillary lymph nodes. The thyroid gland, trachea and esophagus demonstrate no significant findings. Lungs/Pleura: Trace bilateral pleural effusions. There is no pneumothorax. There are patchy ground-glass opacities, greatest within the right upper lobe. Scattered areas linear atelectasis are present in both lungs. There is no consolidation or  suspicious pulmonary nodule. Upper abdomen: The visualized upper abdomen appears unremarkable. Musculoskeletal/Chest wall: There is no chest wall mass or suspicious osseous finding. Patient is status post median sternotomy. There is Teresa Lee thoracic spinal stimulator. Review of the MIP images confirms the above findings. IMPRESSION: 1. No evidence of acute pulmonary embolism. 2. Patchy ground-glass pulmonary opacities, likely indicative of mild viral pneumonia. No consolidation. 3. Trace bilateral pleural effusions. Electronically Signed   By: Teresa SaleM.D.   On: 02/21/2019 16:15   Dg Chest Port 1 View  Result Date: 02/21/2019 CLINICAL DATA:  Dyspnea, COVID-19 pneumonia EXAM: PORTABLE CHEST 1 VIEW COMPARISON:  Chest radiograph from one day prior. FINDINGS: Stable configuration of median sternotomy wires with discontinuity in the lower most wire. Inferior approach spinal stimulator leads terminate over the lower thoracic spinal canal. Stable cardiomediastinal silhouette with normal heart size. No pneumothorax. No definite pleural effusion. No pulmonary edema. Stable streaky opacities overlying the mid to lower lungs. IMPRESSION: Stable chest radiograph with streaky opacities overlying the mid to lower lungs, which could represent scarring, atelectasis or atypical pneumonia. Electronically Signed   By: Teresa SorrelM.D.   On: 02/21/2019 08:14   Dg Chest Port 1 View  Result Date: 02/20/2019 CLINICAL DATA:  Shortness of breath. EXAM: PORTABLE CHEST 1 VIEW COMPARISON:  02/19/2019.  CT 09/06/2017. FINDINGS: Prior median sternotomy. Fracture of the lower sternotomy wire again noted. Heart size normal. Bibasilar atelectasis and infiltrates again noted without interim change. Small bilateral pleural effusions again noted without interim change. No pneumothorax. No acute bony abnormality. Neurostimulator wires noted over the lower thoracic spine in stable position. IMPRESSION: Persistent bibasilar atelectasis and  infiltrates again noted without interim change. Small bilateral pleural effusions again noted without interim change. Electronically Signed   By: TMarcello Moores Lee   On: 02/20/2019 09:14   Dg Chest Port 1 View  Result Date: 02/19/2019 CLINICAL DATA:  Shortness of breath. EXAM: PORTABLE CHEST 1 VIEW COMPARISON:  Single-view of the chest 02/16/2019. PA and lateral chest 09/06/2018. CT chest, abdomen and pelvis 09/05/2017. FINDINGS: Small left pleural effusion and basilar airspace disease are new. There  is Teresa Lee small amount of fluid in the minor fissure versus right midlung atelectasis. Heart size is normal. The patient is status post median sternotomy. Thoracic stimulator noted. IMPRESSION: Small left pleural effusion and basilar airspace disease which could be due to atelectasis or pneumonia. Electronically Signed   By: Teresa Lee.   On: 02/19/2019 14:51   Dg Chest Portable 1 View  Result Date: 02/16/2019 CLINICAL DATA:  Cough, congestion.  Fever. EXAM: PORTABLE CHEST 1 VIEW COMPARISON:  Chest radiograph 09/05/2017 FINDINGS: Normal cardiac and mediastinal contours status post median sternotomy. No consolidative pulmonary opacities. No pleural effusion or pneumothorax. Thoracic spinal stimulator device. IMPRESSION: No acute cardiopulmonary process. Electronically Signed   By: Lovey Newcomer Lee.   On: 02/16/2019 17:47    Microbiology: Recent Results (from the past 240 hour(s))  Culture, blood (routine x 2)     Status: None   Collection Time: 02/16/19  5:59 PM   Specimen: BLOOD  Result Value Ref Range Status   Specimen Description   Final    BLOOD LEFT ANTECUBITAL Performed at [facility], [address] [place], [name] [address]    Special Requests   Final    BOTTLES DRAWN AEROBIC AND ANAEROBIC Blood Culture results may not be optimal due to an inadequate volume of blood received in culture bottles Performed at [facility], [address] [place], [name]  [address]    Culture   Final    NO GROWTH 5 DAYS Performed at [facility], [place] [address] [place], [place] [address]    Report Status 02/21/2019 FINAL  Final  Culture, blood (routine x 2)     Status: None   Collection Time: 02/16/19  6:21 PM   Specimen: BLOOD  Result Value Ref Range Status   Specimen Description   Final    BLOOD RIGHT HAND Performed at [facility], [address] [place], [name] [address]    Special Requests   Final    BOTTLES DRAWN AEROBIC ONLY Blood Culture results may not be optimal due to an inadequate volume of blood received in culture bottles Performed at [facility], [address] [place], [name] [address]    Culture   Final    NO GROWTH 5 DAYS Performed at [facility], [place] [address], [place], [place] [address]    Report Status 02/21/2019 FINAL  Final  Culture, Urine     Status: Abnormal   Collection Time: 02/17/19  9:27 AM   Specimen: Urine, Clean Catch  Result Value Ref Range Status   Specimen Description   Final    URINE, CLEAN CATCH Performed at [facility], [place] [address], [place], [place] [address]    Special Requests   Final    NONE Performed at [facility], [place] [address], [place],  [address]    Culture (Naiah Donahoe)  Final    >=100,000 COLONIES/mL ESCHERICHIA COLI 50,000 COLONIES/mL PROTEUS MIRABILIS    Report Status 02/19/2019 FINAL  Final   Organism ID, Bacteria ESCHERICHIA COLI (Teresa Lee)  Final   Organism ID, Bacteria PROTEUS MIRABILIS (Teresa Lee)  Final      Susceptibility   Escherichia coli - MIC*    AMPICILLIN <=2 SENSITIVE Sensitive     CEFAZOLIN <=4 SENSITIVE Sensitive     CEFTRIAXONE <=1 SENSITIVE Sensitive     CIPROFLOXACIN <=0.25 SENSITIVE Sensitive     GENTAMICIN <=1 SENSITIVE Sensitive  IMIPENEM <=0.25 SENSITIVE Sensitive     NITROFURANTOIN <=16 SENSITIVE Sensitive     TRIMETH/SULFA <=20 SENSITIVE Sensitive     AMPICILLIN/SULBACTAM <=2 SENSITIVE  Sensitive     PIP/TAZO <=4 SENSITIVE Sensitive     Extended ESBL NEGATIVE Sensitive     * >=100,000 COLONIES/mL ESCHERICHIA COLI   Proteus mirabilis - MIC*    AMPICILLIN <=2 SENSITIVE Sensitive     CEFAZOLIN <=4 SENSITIVE Sensitive     CEFTRIAXONE <=1 SENSITIVE Sensitive     CIPROFLOXACIN <=0.25 SENSITIVE Sensitive     GENTAMICIN <=1 SENSITIVE Sensitive     IMIPENEM 4 SENSITIVE Sensitive     NITROFURANTOIN 128 RESISTANT Resistant     TRIMETH/SULFA <=20 SENSITIVE Sensitive     AMPICILLIN/SULBACTAM <=2 SENSITIVE Sensitive     PIP/TAZO <=4 SENSITIVE Sensitive     * 50,000 COLONIES/mL PROTEUS MIRABILIS     Labs: Basic Metabolic Panel: Recent Labs  Lab 02/21/19 0330 02/22/19 0445 02/23/19 0538 02/24/19 0115 02/25/19 0600  NA 139 139 138 139 137  K 3.2* 4.0 4.6 4.0 3.6  CL 99 96* 98 98 96*  CO2 21* 24 25 26 29   GLUCOSE 165* 157* 166* 98 85  BUN 35* 39* 41* 42* 37*  CREATININE 1.14* 1.09* 1.06* 1.14* 1.03*  CALCIUM 7.7* 8.3* 8.9 9.1 8.8*  MG 1.5* 1.6* 1.7 1.7 1.6*   Liver Function Tests: Recent Labs  Lab 02/21/19 0330 02/22/19 0445 02/23/19 0538 02/24/19 0115 02/25/19 0600  AST 28 21 22 27 21   ALT 25 22 22 25 25   ALKPHOS 64 59 52 52 50  BILITOT 0.8 0.6 0.6 0.5 1.0  PROT 6.4* 6.3* 5.9* 5.7* 5.7*  ALBUMIN 3.0* 3.0* 2.9* 2.7* 2.8*   No results for input(s): LIPASE, AMYLASE in the last 168 hours. No results for input(s): AMMONIA in the last 168 hours. CBC: Recent Labs  Lab 02/21/19 0330 02/22/19 0445 02/23/19 0538 02/24/19 0115 02/25/19 0600  WBC 20.4* 21.4* 23.0* 28.0* 26.0*  NEUTROABS 14.7* 15.6* 20.0* 18.3* 15.9*  HGB 9.4* 9.8* 9.7* 9.6* 10.1*  HCT 26.8* 28.8* 28.7* 29.3* 30.9*  MCV 93.1 93.8 95.3 98.3 98.1  PLT 258 304 325 327 298   Cardiac Enzymes: No results for input(s): CKTOTAL, CKMB, CKMBINDEX, TROPONINI in the last 168 hours. BNP: BNP (last 3 results) Recent Labs    02/20/19 1300 02/21/19 0330  BNP 270.6* 406.7*    ProBNP (last 3  results) No results for input(s): PROBNP in the last 8760 hours.  CBG: Recent Labs  Lab 02/24/19 0842 02/24/19 1154 02/24/19 1725 02/24/19 2045 02/25/19 0752  GLUCAP 118* 196* 194* 174* 76       Signed:  Fayrene Helper MD.  [facility] 02/25/2019, 11:42 AM

## 2019-02-26 ENCOUNTER — Telehealth: Payer: Self-pay

## 2019-02-26 NOTE — Telephone Encounter (Signed)
Transition Care Management Follow-up Telephone Call  Date of discharge and from where: 02/25/2019 from [facility]   How have you been since you were released from the hospital? "very weak and tired"  Any questions or concerns? No   Items Reviewed:  Did the pt receive and understand the discharge instructions provided? Yes   Medications obtained and verified? Yes   Any new allergies since your discharge? No   Dietary orders reviewed? Yes  Do you have support at home? Yes   Other (ie: DME, Home Health, etc) none  Functional Questionnaire: (I = Independent and D = Dependent) ADL's: She is able to do everything for herself so far but needs help with preparing meals. She has help from family at home. She has not yet felt like showering since home.   Bathing/Dressing-    Meal Prep-   Eating-   Maintaining continence-   Transferring/Ambulation-   Managing Meds-    Follow up appointments reviewed:    PCP Hospital f/u appt confirmed? Yes  She understands she must quarantine at home until 03/04/19  Specialist Hospital f/u appt confirmed? No  N/A  Are transportation arrangements needed? No   If their condition worsens, is the pt aware to call  their PCP or go to the ED? Yes  Was the patient provided with contact information for the PCP's office or ED? Yes  Was the pt encouraged to call back with questions or concerns? Yes

## 2019-03-03 ENCOUNTER — Other Ambulatory Visit: Payer: Self-pay | Admitting: Family Medicine

## 2019-03-10 ENCOUNTER — Telehealth (INDEPENDENT_AMBULATORY_CARE_PROVIDER_SITE_OTHER): Payer: Medicare HMO | Admitting: Family Medicine

## 2019-03-10 ENCOUNTER — Encounter: Payer: Self-pay | Admitting: Family Medicine

## 2019-03-10 VITALS — BP 86/62 | HR 103 | Temp 97.1°F

## 2019-03-10 DIAGNOSIS — R0602 Shortness of breath: Secondary | ICD-10-CM | POA: Diagnosis not present

## 2019-03-10 DIAGNOSIS — J1282 Pneumonia due to coronavirus disease 2019: Secondary | ICD-10-CM

## 2019-03-10 DIAGNOSIS — J1289 Other viral pneumonia: Secondary | ICD-10-CM

## 2019-03-10 DIAGNOSIS — I10 Essential (primary) hypertension: Secondary | ICD-10-CM | POA: Diagnosis not present

## 2019-03-10 DIAGNOSIS — U071 COVID-19: Secondary | ICD-10-CM | POA: Diagnosis not present

## 2019-03-10 DIAGNOSIS — Z9181 History of falling: Secondary | ICD-10-CM

## 2019-03-10 NOTE — Progress Notes (Signed)
Virtual Visit via Telephone Note  I connected with Teresa Lee and her husband on 03/10/19 at  3:30 PM EST by telephone and verified that I am speaking with the correct person using two identifiers.   I discussed the limitations, risks, security and privacy concerns of performing an evaluation and management service by telephone and the availability of in person appointments. I also discussed with the patient that there may be a patient responsible charge related to this service. The patient expressed understanding and agreed to proceed.  Location patient: home Location provider: work office Participants present for the call: patient, provider Patient did not have a visit in the prior 7 days to address this/these issue(s).   History of Present Illness: Ms Teresa Lee is a 58 yo female with Hx HTN,anxiety,fibromyalgia,and myasthenia gravis among some who was recently hospitalized, admitted on 11/29 and discharged on 02/25/19. Telephone visit on 02/12/19 after being Dx'ed with COVID 19 infection. She presented to the ER because of weakness,decreased appetite,SOB,and malaise. Chest CT with mild viral pneumonia and trace of effusion. She was treated with Remdesivir and steroid dose increased, the latter one already tapered down to her normal dose.  CXR on 02/16/19 negative for cardiopulmonary process. CXR on 02/19/19 Small left pleural effusion and basilar airspace disease which could be due to atelectasis or pneumonia. Chest CT on 02/21/19:1. No evidence of acute pulmonary embolism. 2. Patchy ground-glass pulmonary opacities, likely indicative of mild viral pneumonia. No consolidation. 3. Trace bilateral pleural effusions.  Lab Results  Component Value Date   WBC 26.0 (H) 02/25/2019   HGB 10.1 (L) 02/25/2019   HCT 30.9 (L) 02/25/2019   MCV 98.1 02/25/2019   PLT 298 02/25/2019   Lab Results  Component Value Date   CREATININE 1.03 (H) 02/25/2019   BUN 37 (H) 02/25/2019   NA 137  02/25/2019   K 3.6 02/25/2019   CL 96 (L) 02/25/2019   CO2 29 02/25/2019    C/O feeling "very weak" and unstable gait,  afraid to fall, she almost did so a few times. Home PT was nor deemed necessary.She was given some exercises to do at home.  Her husband is concerned about fever, max temperature 98.8 F yesterday. She is c/o SOB and requesting "fluid pills." Hospitalization was complicated by AKI. She received Lasix on 12/3 and 12/4./20. She denies edema,decreased urine output,gross hematuria,or foam in urine. No orthopnea or PND.  Husband reporting O2 sats in the "low 90s." She was not discharged on supplemental oxygen.  She has not noted wheezing and no cough but her husband has mentioned that she has some wheezing when she is asleep.  She is using albuterol inhaler twice daily. She is using Symbicort 80-4.5 mcg 2 puffs twice daily.  Appetite is great. Negative for abdominal pain,N/V,or changes in bowel habits.  HTN: She is on Triamterene-hydrochlorothiazide 37.5-25 mg daily and Losartan 50 mg daily. She is not checking BP regularly. Negative for CP,palpitations,or diaphoresis.  Observations/Objective: Patient sounds cheerful and well on the phone. I do not appreciate any SOB. Speech and thought processing are grossly intact.+ Anxious. Patient reported vitals:BP (!) 86/62   Pulse (!) 103   Temp (!) 97.1 F (36.2 C)   SpO2 94%    Assessment and Plan: 1. Pneumonia due to COVID-19 virus Recommend continuing checking temp,she should not have fever at this time. Explained that it is not unusual to feel fatigue and unstable gait after long hospitalization. Some symptoms may last a few weeks but should start  improving, sometimes slowly. O2 sats adequate and no respiratory distress, so I do  ot think she needs to go to the ER at this time but if symptoms get any worse she needs to seek immediate medical attention.  We will plan on repeating labs and CXR in 1-2 weeks.  2.  SOB (shortness of breath) on exertion Not getting worse. Could be caused by asthma,diconditioning,residual from respiratory infection. Recommend Albuterol inh 2 puff q 6 hours x 5 days then prn. Continue Symbicort 80-4,5 mcg bid. Clearly instructed about warning signs.  3. Risk for falls Home PT will be arranged. Fall precautions discussed.  - Ambulatory referral to Home Health  4. Essential hypertension Hypotense. Educated about side effects of diuretics. Adequate hydration. Recommend holding on Triamterene-HCTZ for now. No changes in Losartan. Monitor BP regularly. F/U in 1 week.   Follow Up Instructions:  Return in about 1 week (around 03/17/2019) for Dyspnea,covid 19 pneumonia..  I did not refer this patient for an OV in the next 24 hours for this/these issue(s).  I discussed the assessment and treatment plan with the patient. She was provided an opportunity to ask questions and all were answered. She agreed with the plan and demonstrated an understanding of the instructions.   I provided 24-25 minutes of non-face-to-face time during this encounter.   Teresa Martinique, MD

## 2019-03-11 ENCOUNTER — Ambulatory Visit: Payer: Medicare HMO | Attending: Internal Medicine

## 2019-03-11 ENCOUNTER — Other Ambulatory Visit: Payer: Self-pay

## 2019-03-11 DIAGNOSIS — Z20828 Contact with and (suspected) exposure to other viral communicable diseases: Secondary | ICD-10-CM | POA: Diagnosis not present

## 2019-03-11 DIAGNOSIS — Z20822 Contact with and (suspected) exposure to covid-19: Secondary | ICD-10-CM

## 2019-03-12 ENCOUNTER — Other Ambulatory Visit: Payer: Self-pay | Admitting: Family Medicine

## 2019-03-12 ENCOUNTER — Encounter: Payer: Self-pay | Admitting: Family Medicine

## 2019-03-13 LAB — NOVEL CORONAVIRUS, NAA: SARS-CoV-2, NAA: NOT DETECTED

## 2019-03-17 DIAGNOSIS — M47817 Spondylosis without myelopathy or radiculopathy, lumbosacral region: Secondary | ICD-10-CM | POA: Diagnosis not present

## 2019-03-25 ENCOUNTER — Encounter: Payer: Self-pay | Admitting: Family Medicine

## 2019-03-27 ENCOUNTER — Encounter: Payer: Self-pay | Admitting: Family Medicine

## 2019-03-28 ENCOUNTER — Telehealth: Payer: Self-pay | Admitting: Family Medicine

## 2019-03-28 ENCOUNTER — Encounter: Payer: Self-pay | Admitting: Family Medicine

## 2019-03-28 NOTE — Telephone Encounter (Signed)
Forms received, will fill out & place on provider's desk for signature.

## 2019-03-28 NOTE — Telephone Encounter (Signed)
Pt dropped off forms to be completed by the provider. Upon completion pt would like a copy to be mailed to [facility] Barry at [place], [place] [address] or  Call spouse at [phone] for pickup. Form placed in providers folder.

## 2019-03-28 NOTE — Telephone Encounter (Signed)
Form completed, placed in pcp's box for signature.

## 2019-03-31 NOTE — Telephone Encounter (Signed)
Forms placed up front, pt aware they are ready for pick up.

## 2019-04-02 DIAGNOSIS — Q044 Septo-optic dysplasia of brain: Secondary | ICD-10-CM | POA: Diagnosis not present

## 2019-04-02 DIAGNOSIS — G7000 Myasthenia gravis without (acute) exacerbation: Secondary | ICD-10-CM | POA: Diagnosis not present

## 2019-04-02 DIAGNOSIS — Z79899 Other long term (current) drug therapy: Secondary | ICD-10-CM | POA: Diagnosis not present

## 2019-04-08 DIAGNOSIS — M79659 Pain in unspecified thigh: Secondary | ICD-10-CM | POA: Diagnosis not present

## 2019-04-08 DIAGNOSIS — M5136 Other intervertebral disc degeneration, lumbar region: Secondary | ICD-10-CM | POA: Diagnosis not present

## 2019-04-08 DIAGNOSIS — G894 Chronic pain syndrome: Secondary | ICD-10-CM | POA: Diagnosis not present

## 2019-04-08 DIAGNOSIS — M47817 Spondylosis without myelopathy or radiculopathy, lumbosacral region: Secondary | ICD-10-CM | POA: Diagnosis not present

## 2019-04-08 DIAGNOSIS — Z79891 Long term (current) use of opiate analgesic: Secondary | ICD-10-CM | POA: Diagnosis not present

## 2019-04-08 DIAGNOSIS — Z79899 Other long term (current) drug therapy: Secondary | ICD-10-CM | POA: Diagnosis not present

## 2019-04-15 ENCOUNTER — Other Ambulatory Visit: Payer: Self-pay | Admitting: Family Medicine

## 2019-04-15 DIAGNOSIS — K219 Gastro-esophageal reflux disease without esophagitis: Secondary | ICD-10-CM

## 2019-04-16 DIAGNOSIS — L821 Other seborrheic keratosis: Secondary | ICD-10-CM | POA: Diagnosis not present

## 2019-04-16 DIAGNOSIS — Z85820 Personal history of malignant melanoma of skin: Secondary | ICD-10-CM | POA: Diagnosis not present

## 2019-04-16 DIAGNOSIS — D692 Other nonthrombocytopenic purpura: Secondary | ICD-10-CM | POA: Diagnosis not present

## 2019-04-16 DIAGNOSIS — D2271 Melanocytic nevi of right lower limb, including hip: Secondary | ICD-10-CM | POA: Diagnosis not present

## 2019-04-16 DIAGNOSIS — L565 Disseminated superficial actinic porokeratosis (DSAP): Secondary | ICD-10-CM | POA: Diagnosis not present

## 2019-04-21 DIAGNOSIS — M47817 Spondylosis without myelopathy or radiculopathy, lumbosacral region: Secondary | ICD-10-CM | POA: Diagnosis not present

## 2019-04-25 ENCOUNTER — Other Ambulatory Visit: Payer: Self-pay | Admitting: Family Medicine

## 2019-04-25 DIAGNOSIS — I10 Essential (primary) hypertension: Secondary | ICD-10-CM

## 2019-04-29 DIAGNOSIS — N393 Stress incontinence (female) (male): Secondary | ICD-10-CM | POA: Diagnosis not present

## 2019-04-29 DIAGNOSIS — R351 Nocturia: Secondary | ICD-10-CM | POA: Diagnosis not present

## 2019-05-05 ENCOUNTER — Other Ambulatory Visit: Payer: Self-pay | Admitting: Family Medicine

## 2019-05-05 DIAGNOSIS — I10 Essential (primary) hypertension: Secondary | ICD-10-CM

## 2019-05-05 DIAGNOSIS — M47817 Spondylosis without myelopathy or radiculopathy, lumbosacral region: Secondary | ICD-10-CM | POA: Diagnosis not present

## 2019-06-02 DIAGNOSIS — G894 Chronic pain syndrome: Secondary | ICD-10-CM | POA: Diagnosis not present

## 2019-06-02 DIAGNOSIS — M5136 Other intervertebral disc degeneration, lumbar region: Secondary | ICD-10-CM | POA: Diagnosis not present

## 2019-06-02 DIAGNOSIS — G7000 Myasthenia gravis without (acute) exacerbation: Secondary | ICD-10-CM | POA: Diagnosis not present

## 2019-06-02 DIAGNOSIS — M7918 Myalgia, other site: Secondary | ICD-10-CM | POA: Diagnosis not present

## 2019-06-12 DIAGNOSIS — G7000 Myasthenia gravis without (acute) exacerbation: Secondary | ICD-10-CM | POA: Diagnosis not present

## 2019-06-12 DIAGNOSIS — G894 Chronic pain syndrome: Secondary | ICD-10-CM | POA: Diagnosis not present

## 2019-06-12 DIAGNOSIS — M25559 Pain in unspecified hip: Secondary | ICD-10-CM | POA: Diagnosis not present

## 2019-06-12 DIAGNOSIS — M47817 Spondylosis without myelopathy or radiculopathy, lumbosacral region: Secondary | ICD-10-CM | POA: Diagnosis not present

## 2019-06-12 DIAGNOSIS — M5030 Other cervical disc degeneration, unspecified cervical region: Secondary | ICD-10-CM | POA: Diagnosis not present

## 2019-06-12 DIAGNOSIS — M47812 Spondylosis without myelopathy or radiculopathy, cervical region: Secondary | ICD-10-CM | POA: Diagnosis not present

## 2019-06-12 DIAGNOSIS — M5136 Other intervertebral disc degeneration, lumbar region: Secondary | ICD-10-CM | POA: Diagnosis not present

## 2019-06-30 DIAGNOSIS — M25511 Pain in right shoulder: Secondary | ICD-10-CM | POA: Diagnosis not present

## 2019-06-30 DIAGNOSIS — Z79899 Other long term (current) drug therapy: Secondary | ICD-10-CM | POA: Diagnosis not present

## 2019-06-30 DIAGNOSIS — G894 Chronic pain syndrome: Secondary | ICD-10-CM | POA: Diagnosis not present

## 2019-06-30 DIAGNOSIS — M7918 Myalgia, other site: Secondary | ICD-10-CM | POA: Diagnosis not present

## 2019-06-30 DIAGNOSIS — Z79891 Long term (current) use of opiate analgesic: Secondary | ICD-10-CM | POA: Diagnosis not present

## 2019-06-30 DIAGNOSIS — M5136 Other intervertebral disc degeneration, lumbar region: Secondary | ICD-10-CM | POA: Diagnosis not present

## 2019-07-09 ENCOUNTER — Other Ambulatory Visit: Payer: Self-pay | Admitting: Family Medicine

## 2019-07-09 DIAGNOSIS — K219 Gastro-esophageal reflux disease without esophagitis: Secondary | ICD-10-CM

## 2019-07-14 ENCOUNTER — Other Ambulatory Visit: Payer: Self-pay

## 2019-07-14 ENCOUNTER — Ambulatory Visit
Admission: RE | Admit: 2019-07-14 | Discharge: 2019-07-14 | Disposition: A | Discharge status: Home / Self Care | Payer: Medicare HMO | Source: Ambulatory Visit | Attending: Pain Medicine | Admitting: Pain Medicine

## 2019-07-14 ENCOUNTER — Other Ambulatory Visit: Payer: Self-pay | Admitting: Pain Medicine

## 2019-07-14 DIAGNOSIS — M25551 Pain in right hip: Secondary | ICD-10-CM

## 2019-07-14 DIAGNOSIS — M5136 Other intervertebral disc degeneration, lumbar region: Secondary | ICD-10-CM | POA: Diagnosis not present

## 2019-07-14 DIAGNOSIS — G7000 Myasthenia gravis without (acute) exacerbation: Secondary | ICD-10-CM | POA: Diagnosis not present

## 2019-07-14 DIAGNOSIS — G894 Chronic pain syndrome: Secondary | ICD-10-CM | POA: Diagnosis not present

## 2019-07-14 DIAGNOSIS — M25552 Pain in left hip: Secondary | ICD-10-CM | POA: Diagnosis not present

## 2019-07-14 DIAGNOSIS — M25559 Pain in unspecified hip: Secondary | ICD-10-CM | POA: Diagnosis not present

## 2019-07-31 ENCOUNTER — Other Ambulatory Visit: Payer: Self-pay | Admitting: Family Medicine

## 2019-07-31 DIAGNOSIS — G2581 Restless legs syndrome: Secondary | ICD-10-CM

## 2019-07-31 DIAGNOSIS — E559 Vitamin D deficiency, unspecified: Secondary | ICD-10-CM

## 2019-08-04 DIAGNOSIS — M47817 Spondylosis without myelopathy or radiculopathy, lumbosacral region: Secondary | ICD-10-CM | POA: Diagnosis not present

## 2019-08-04 DIAGNOSIS — G894 Chronic pain syndrome: Secondary | ICD-10-CM | POA: Diagnosis not present

## 2019-08-04 DIAGNOSIS — M5136 Other intervertebral disc degeneration, lumbar region: Secondary | ICD-10-CM | POA: Diagnosis not present

## 2019-08-04 DIAGNOSIS — M79651 Pain in right thigh: Secondary | ICD-10-CM | POA: Diagnosis not present

## 2019-08-04 NOTE — Telephone Encounter (Signed)
Patient need to schedule an ov for more refills. 

## 2019-08-06 DIAGNOSIS — Q828 Other specified congenital malformations of skin: Secondary | ICD-10-CM | POA: Diagnosis not present

## 2019-08-06 DIAGNOSIS — L565 Disseminated superficial actinic porokeratosis (DSAP): Secondary | ICD-10-CM | POA: Diagnosis not present

## 2019-08-06 DIAGNOSIS — Z85820 Personal history of malignant melanoma of skin: Secondary | ICD-10-CM | POA: Diagnosis not present

## 2019-08-08 ENCOUNTER — Encounter: Payer: Self-pay | Admitting: Family Medicine

## 2019-08-08 NOTE — Telephone Encounter (Signed)
Sent a message to the patient to schedule an appointment.

## 2019-08-11 ENCOUNTER — Other Ambulatory Visit: Payer: Self-pay

## 2019-08-12 ENCOUNTER — Encounter: Payer: Self-pay | Admitting: Family Medicine

## 2019-08-12 ENCOUNTER — Ambulatory Visit (INDEPENDENT_AMBULATORY_CARE_PROVIDER_SITE_OTHER): Payer: Medicare HMO | Admitting: Family Medicine

## 2019-08-12 VITALS — BP 130/70 | HR 97 | Temp 97.9°F | Resp 12 | Ht 61.0 in | Wt 161.2 lb

## 2019-08-12 DIAGNOSIS — E876 Hypokalemia: Secondary | ICD-10-CM

## 2019-08-12 DIAGNOSIS — I10 Essential (primary) hypertension: Secondary | ICD-10-CM | POA: Diagnosis not present

## 2019-08-12 DIAGNOSIS — G7000 Myasthenia gravis without (acute) exacerbation: Secondary | ICD-10-CM

## 2019-08-12 DIAGNOSIS — E538 Deficiency of other specified B group vitamins: Secondary | ICD-10-CM

## 2019-08-12 DIAGNOSIS — E6609 Other obesity due to excess calories: Secondary | ICD-10-CM

## 2019-08-12 DIAGNOSIS — G894 Chronic pain syndrome: Secondary | ICD-10-CM

## 2019-08-12 DIAGNOSIS — Z6830 Body mass index (BMI) 30.0-30.9, adult: Secondary | ICD-10-CM

## 2019-08-12 MED ORDER — MELOXICAM 15 MG PO TABS: 15.0000 mg | ORAL_TABLET | Freq: Every day | ORAL | 1 refills | Status: DC | PRN

## 2019-08-12 MED ORDER — CYANOCOBALAMIN 1000 MCG/ML IJ SOLN
1000.0000 ug | Freq: Once | INTRAMUSCULAR | Status: AC
  Administered 2019-08-12: 1000 ug via INTRAMUSCULAR

## 2019-08-12 MED ORDER — POTASSIUM CHLORIDE CRYS ER 20 MEQ PO TBCR: 20.0000 meq | EXTENDED_RELEASE_TABLET | Freq: Every day | ORAL | 1 refills | Status: DC

## 2019-08-12 NOTE — Progress Notes (Signed)
Chief Complaint  Patient presents with  . Medication Refill   HPI:  Teresa Lee is a 59 y.o. female with hx of anxiety,chronic pain,crohn disease,fatigue,and HTN  who is here today for 6 months follow up.   Last virtual visit on 03/10/19 due to COVID 19 infection. No new problems since her last visit. Today she is concerned about wt gained and increased appetite.Requesting something to help with appetite/wt loss. She attributes wt gained to Prednisone use and frequent steroid injections to treat joint and back pain. Last OV on 01/31/19 her wt was 149 Lb 3.2 oz.  She is not exercising and not following a healthful diet. She takes Prednisone 10 mg daily to treat myasthenia gravis.  Chronic pain disorder:She follows with pain management. Fibromyalgia and generalized OA.   Requesting Rx for Meloxicam, which was discontinued after hospitalization 01/2019 due to AKI. Since medication was discontinued she has had more joint pain and myalgias.  No new problems since her last visit.  Had blood work in 03/2019.  HTN: Negative for severe/frequent headache, visual changes, chest pain, dyspnea, focal weakness, or edema. Currently she is on Losartan 50 mg daily and Triamterene 37.5-25 mg daily. Not checking BP regularly.  Hypokalemia: She takes 1/2-1 tab daily. At the time she had blood worse,she was taking 1/2 tab of KCL 20 meq daily,she has taken 1 tab intermittent because started having muscle cramps. She takes Methocarbamol 750 mg daily prn for muscle cramps.  [place] Component Name Value Ref Range  Sodium 139 135 - 145 mmol/L  Potassium 3.4 (L) 3.5 - 5 mmol/L  Chloride 101 98 - 108 mmol/L  Carbon Dioxide (CO2) 21 21 - 30 mmol/L  Urea Nitrogen (BUN) 20 7 - 20 mg/dL  Creatinine 0.9 0.4 - 1 mg/dL   B12 deficiency: She is on B12 1000 mcg q 4-5 weeks, frequency decreased in 01/2019.  Lab Results  Component Value Date   VITAMINB12 >1500 (H)  01/31/2019   Review of Systems  Constitutional: Positive for fatigue. Negative for activity change, appetite change and fever.  HENT: Negative for mouth sores, nosebleeds and sore throat.   Respiratory: Negative for cough, shortness of breath and wheezing.   Cardiovascular: Negative for chest pain, palpitations and leg swelling.  Gastrointestinal: Negative for abdominal pain, nausea and vomiting.       Negative for changes in bowel habits.  Genitourinary: Negative for decreased urine volume, dysuria and hematuria.  Musculoskeletal: Positive for arthralgias, back pain and myalgias. Negative for gait problem.  Skin: Negative for rash and wound.  Neurological: Negative for syncope, facial asymmetry and speech difficulty.  Psychiatric/Behavioral: Positive for sleep disturbance. Negative for confusion and hallucinations. The patient is nervous/anxious.   Rest of ROS, see pertinent positives sand negatives in HPI  Current Outpatient Medications on File Prior to Visit  Medication Sig Dispense Refill  . albuterol (PROVENTIL HFA) 108 (90 Base) MCG/ACT inhaler Inhale 1 puff into the lungs every 6 (six) hours as needed for wheezing or shortness of breath. 18 g 2  . albuterol (PROVENTIL) (2.5 MG/3ML) 0.083% nebulizer solution Take 2.5 mg by nebulization every 6 (six) hours as needed for wheezing or shortness of breath.     . ALPRAZolam (XANAX) 0.5 MG tablet Take by mouth.    . budesonide-formoterol (SYMBICORT) 80-4.5 MCG/ACT inhaler Inhale 2 puffs into the lungs 2 (two) times daily. 1 Inhaler 3  . Cholecalciferol 50 MCG (2000 UT) TABS Take by mouth.    Marland Kitchen  cyanocobalamin (,VITAMIN B-12,) 1000 MCG/ML injection Inject 1,000 mcg into the muscle every 30 (thirty) days.    Marland Kitchen desmopressin (DDAVP) 0.1 MG tablet Take 0.3 mg by mouth daily.     Marland Kitchen estradiol (ESTRACE) 2 MG tablet Take 2 mg by mouth at bedtime.     Marland Kitchen HYDROcodone-acetaminophen (NORCO) 10-325 MG tablet Take 1-2 tablets by mouth every 6 (six) hours as  needed for moderate pain (total 3 tabs daily).     Marland Kitchen losartan (COZAAR) 50 MG tablet TAKE 1 TABLET DAILY 90 tablet 2  . Mesalamine (ASACOL) 400 MG CPDR DR capsule Take 800 mg by mouth 2 (two) times daily.    . methocarbamol (ROBAXIN) 750 MG tablet Take 750 mg by mouth at bedtime as needed for muscle spasms.     . mycophenolate (CELLCEPT) 500 MG tablet Take 1,000 mg by mouth 2 (two) times daily.     Marland Kitchen omega-3 acid ethyl esters (LOVAZA) 1 g capsule Take 1 capsule (1 g total) by mouth 2 (two) times daily. 60 capsule 3  . ondansetron (ZOFRAN) 4 MG tablet Take 1 tablet (4 mg total) by mouth every 8 (eight) hours as needed for nausea or vomiting. 20 tablet 0  . pantoprazole (PROTONIX) 40 MG tablet TAKE 1 TABLET DAILY 90 tablet 0  . predniSONE (DELTASONE) 10 MG tablet Take 10 mg by mouth daily.     . progesterone (PROMETRIUM) 100 MG capsule Take 100 mg by mouth at bedtime.    . pyridostigmine (MESTINON) 60 MG tablet Take 30 mg by mouth 3 (three) times daily. Take 1/2 tablet 3 times per day    . rOPINIRole (REQUIP) 1 MG tablet Take 1-2 tablets (1-2 mg total) by mouth at bedtime. 180 tablet 1  . rosuvastatin (CRESTOR) 40 MG tablet Take 1 tablet (40 mg total) by mouth daily. 90 tablet 2  . Spacer/Aero-Holding Chambers (AEROCHAMBER PLUS) inhaler Use as instructed to use with inahaler. 1 each 1  . triamterene-hydrochlorothiazide (DYAZIDE) 37.5-25 MG capsule TAKE (1) CAPSULE DAILY IN THE MORNING. 90 capsule 1  . vitamin B-12 (CYANOCOBALAMIN) 1000 MCG tablet Take 1,000 mcg by mouth daily.    . Vitamin D, Ergocalciferol, (DRISDOL) 1.25 MG (50000 UNIT) CAPS capsule 1 CAPSULE EVERY 2 WEEKS 6 capsule 2   No current facility-administered medications on file prior to visit.   Past Medical History:  Diagnosis Date  . Arthritis    osteoarthritis  . Asthma   . Blind left eye   . Cancer ([place])    skin  . Crohn's disease ([place])   . CTS (carpal tunnel syndrome)   . DJD (degenerative joint disease)    Both cervical  spine and LS spine  . Fibromyalgia   . GERD (gastroesophageal reflux disease)   . Heart murmur   . Herniated nucleus pulposus   . Hyperlipidemia   . Hypertension   . IBS (irritable bowel syndrome)   . Leukocytosis   . Myasthenia gravis   . Myasthenia gravis ([place])   . Spinal cord stimulator status    Allergies  Allergen Reactions  . Latex Other (See Comments)    BLISTER  . Lorazepam     Other reaction(s): Hallucinations  . Orange Concentrate [Flavoring Agent]     Acid reflux  . Other Hives, Nausea Only and Other (See Comments)    Some nuts cause a rash  . Percocet [Oxycodone-Acetaminophen] Hives  . Sulfa Antibiotics Hives  . Sulfamethoxazole-Trimethoprim Hives  . Tapentadol Hcl Other (See Comments)  HEADACHES NUCENTA  . Tomato     Burns skin  . Bioflavonoids Rash    Causes burning of skin and blisters  . Cefixime     Due to myasthenia gravis  . Nucynta [Tapentadol]     Headache     Social History   Socioeconomic History  . Marital status: Married    Spouse name: Not on file  . Number of children: 1  . Years of education: Not on file  . Highest education level: Not on file  Occupational History  . Occupation: disabled  Tobacco Use  . Smoking status: Never Smoker  . Smokeless tobacco: Never Used  Substance and Sexual Activity  . Alcohol use: Yes    Comment: occassional wine  . Drug use: No  . Sexual activity: Not on file  Other Topics Concern  . Not on file  Social History Narrative  . Not on file   Social Determinants of Health   Financial Resource Strain:   . Difficulty of Paying Living Expenses:   Food Insecurity:   . Worried About Charity fundraiser in the Last Year:   . Arboriculturist in the Last Year:   Transportation Needs:   . Film/video editor (Medical):   Marland Kitchen Lack of Transportation (Non-Medical):   Physical Activity:   . Days of Exercise per Week:   . Minutes of Exercise per Session:   Stress:   . Feeling of Stress :   Social  Connections:   . Frequency of Communication with Friends and Family:   . Frequency of Social Gatherings with Friends and Family:   . Attends Religious Services:   . Active Member of Clubs or Organizations:   . Attends Archivist Meetings:   Marland Kitchen Marital Status:     Vitals:   08/12/19 1611  BP: 130/70  Pulse: 97  Resp: 12  Temp: 97.9 F (36.6 C)  SpO2: 97%   Body mass index is 30.47 kg/m.  Physical Exam  Nursing note and vitals reviewed. Constitutional: She is oriented to person, place, and time. She appears well-developed. No distress.  HENT:  Head: Normocephalic and atraumatic.  Mouth/Throat: Oropharynx is clear and moist and mucous membranes are normal.  Eyes: Conjunctivae are normal.  Cardiovascular: Normal rate and regular rhythm.  No murmur heard. Pulses:      Dorsalis pedis pulses are 2+ on the right side and 2+ on the left side.  Respiratory: Effort normal and breath sounds normal. No respiratory distress.  GI: Soft. She exhibits no mass. There is no hepatomegaly. There is no abdominal tenderness.  Musculoskeletal:        General: Edema (TRace pitting lE edema,bilateral.) present.  Lymphadenopathy:    She has no cervical adenopathy.  Neurological: She is alert and oriented to person, place, and time. She has normal strength. No cranial nerve deficit. Gait normal.  Skin: Skin is warm. No rash noted. No erythema.  Psychiatric: Her mood appears anxious.  Well groomed, good eye contact.   ASSESSMENT AND PLAN:  Teresa Lee was seen today for 6 months follow-up.  Diagnoses and all orders for this visit:  Class 1 obesity due to excess calories with serious comorbidity and body mass index (BMI) of 30.0 to 30.9 in adult We discussed benefits of wt loss as well as adverse effects of obesity. Consistency with healthy diet and physical activity recommended. 1800 cal/daily. Food diary may also help. I do not recommend pharmacologic treatment for  obesity, side effects of Prednisone discussed.  Chronic pain disorder We discussed side effects of chronic NSAID's use. Meloxican was helping,so resumed 15 mg to take daily prn. Continue following with pain management.  Low impact exercise and good sleep hygiene important for fibromyalgia.  -     potassium chloride SA (KLOR-CON) 20 MEQ tablet; Take 1 tablet (20 mEq total) by mouth daily.  B12 deficiency B12 injection given today. No changes in current management. B12 can be checked with next blood work.  -     cyanocobalamin ((VITAMIN B-12)) injection 1,000 mcg  Essential hypertension BP adequately controlled. No changes in current management. Continue low salt diet. Try to monitor BP at home.  Myasthenia gravis without (acute) exacerbation (HCC) On chronic prednisone treatment. Following with neurologist.  Hypokalemia K+ mildly low, 3.4. Explained that muscle cramps can be related to fibromyalgia and/or OA. Side effects of diuretics discussed. Increase KCL from 10 meq to 20 meq (1 tab daily).  -     meloxicam (MOBIC) 15 MG tablet; Take 1 tablet (15 mg total) by mouth daily as needed for pain.    Return in about 4 months (around 12/13/2019) for labs in 2-3 weeks..  Teresa Schnepp G. Martinique, MD  [facility]. [place] office.  A few things to remember from today's visit: Increase potassium to 20 mg daily. Meloxicam 15 mg daily as needed added today. No changes in rest of your medications.  Please discussed side effects of steroids with your pain management provider. 15 minutes of daily walking. Food diary and calorie count, 1800 cal/day.  If you need refills please call your pharmacy. Do not use My Chart to request refills or for acute issues that need immediate attention.    Please be sure medication list is accurate. If a new problem present, please set up appointment sooner than planned today.

## 2019-08-12 NOTE — Patient Instructions (Signed)
A few things to remember from today's visit: Increase potassium to 20 mg daily. Meloxicam 15 mg daily as needed added today. No changes in rest of your medications.  Please discussed side effects of steroids with your pain management provider. 15 minutes of daily walking. Food diary and calorie count, 1800 cal/day.  If you need refills please call your pharmacy. Do not use My Chart to request refills or for acute issues that need immediate attention.    Please be sure medication list is accurate. If a new problem present, please set up appointment sooner than planned today.

## 2019-08-14 ENCOUNTER — Encounter: Payer: Self-pay | Admitting: Family Medicine

## 2019-09-01 DIAGNOSIS — M47817 Spondylosis without myelopathy or radiculopathy, lumbosacral region: Secondary | ICD-10-CM | POA: Diagnosis not present

## 2019-09-01 DIAGNOSIS — M79651 Pain in right thigh: Secondary | ICD-10-CM | POA: Diagnosis not present

## 2019-09-01 DIAGNOSIS — G894 Chronic pain syndrome: Secondary | ICD-10-CM | POA: Diagnosis not present

## 2019-09-01 DIAGNOSIS — M5136 Other intervertebral disc degeneration, lumbar region: Secondary | ICD-10-CM | POA: Diagnosis not present

## 2019-09-05 ENCOUNTER — Other Ambulatory Visit: Payer: Self-pay | Admitting: Family Medicine

## 2019-09-05 DIAGNOSIS — J4520 Mild intermittent asthma, uncomplicated: Secondary | ICD-10-CM

## 2019-09-22 ENCOUNTER — Encounter: Payer: Self-pay | Admitting: Family Medicine

## 2019-09-23 ENCOUNTER — Telehealth: Payer: Self-pay

## 2019-09-23 ENCOUNTER — Other Ambulatory Visit: Payer: Self-pay | Admitting: Family Medicine

## 2019-09-23 DIAGNOSIS — E538 Deficiency of other specified B group vitamins: Secondary | ICD-10-CM

## 2019-09-23 DIAGNOSIS — E876 Hypokalemia: Secondary | ICD-10-CM

## 2019-09-23 NOTE — Telephone Encounter (Signed)
Patient needs a lab appointment scheduled, thanks!

## 2019-09-23 NOTE — Telephone Encounter (Signed)
Pt is scheduled labs on July 12th for labs but she is wondering if a portable nebulizer can be called in. She stated she had one years ago but misplaced it.   [place], [place] Phone:  [phone]  Fax:  [phone]

## 2019-09-23 NOTE — Telephone Encounter (Signed)
Okay for nebulizer?

## 2019-09-24 ENCOUNTER — Encounter: Payer: Self-pay | Admitting: Family Medicine

## 2019-09-24 NOTE — Telephone Encounter (Signed)
See my chart message

## 2019-09-24 NOTE — Telephone Encounter (Signed)
I do not recommend Albuterol nebs at home. Albuterol inh should be enough for her at this time. Thanks, BJ

## 2019-09-25 ENCOUNTER — Telehealth (INDEPENDENT_AMBULATORY_CARE_PROVIDER_SITE_OTHER): Payer: Medicare HMO | Admitting: Family Medicine

## 2019-09-25 DIAGNOSIS — J4520 Mild intermittent asthma, uncomplicated: Secondary | ICD-10-CM | POA: Diagnosis not present

## 2019-09-25 DIAGNOSIS — D849 Immunodeficiency, unspecified: Secondary | ICD-10-CM

## 2019-09-25 DIAGNOSIS — R059 Cough, unspecified: Secondary | ICD-10-CM

## 2019-09-25 DIAGNOSIS — R05 Cough: Secondary | ICD-10-CM | POA: Diagnosis not present

## 2019-09-25 MED ORDER — PREDNISONE 20 MG PO TABS
40.0000 mg | ORAL_TABLET | Freq: Every day | ORAL | 0 refills | Status: DC
Start: 2019-09-25 — End: 2020-04-26

## 2019-09-25 NOTE — Progress Notes (Signed)
Virtual Visit via Telephone Note  I connected with Teresa Lee on 09/25/19 at  1:00 PM EDT by telephone and verified that I am speaking with the correct person using two identifiers.   I discussed the limitations, risks, security and privacy concerns of performing an evaluation and management service by telephone and the availability of in person appointments. I also discussed with the patient that there may be a patient responsible charge related to this service. The patient expressed understanding and agreed to proceed.  Location patient: home, [employer] Location provider: work or home office Participants present for the call: patient, provider Patient did not have a visit in the prior 7 days to address this/these issue(s).   History of Present Illness:   Acute visit for cough: -started 4 days ago -symptoms include sore throat, sneezing, cough, mild diarrhea, mild HA and sinus pain pressure -was around someone with a cold -temp 97.7 -denies SOB, documented fever, vomiting, inability to get out of bed -used her alb and it helped with wheezing and cough, mild SOB if coughing a lot -PMH sig for Intermittent Asthma and Crohn's disease, Chronic pain on narcotic pain medication, MG (on prednisone and cellcept) -she has had prior infection with COVID and has been fully vaccinated for COVID19 -reports often with her asthma has to bump her prednisone to 40 mg for several days when has these symptoms  Observations/Objective: Patient sounds cheerful and well on the phone. I do not appreciate any SOB. Speech and thought processing are grossly intact. Patient reported vitals:  Assessment and Plan:  Mild intermittent asthma, unspecified whether complicated  Cough  Immunocompromised ([place])  -we discussed possible serious and likely etiologies, options for evaluation and workup, limitations of telemedicine visit vs in person visit, treatment, treatment risks and precautions. Pt prefers to  treat via telemedicine empirically rather then risking or undertaking an in person visit at this moment. Likely viral resp illness with bronchitis. She opted for trial prednisone burst to 59m daily x4 days and agrees to let her specialist know. Also, alb prn and discussed proper use of inhaler. Advised low thresh hold for adding abx if worsening or not improving and she agrees to call PCP office or seek prompt in person care if worsening, new symptoms arise, or if is not improving with treatment.  Follow Up Instructions:  I did not refer this patient for an OV in the next 24 hours for this/these issue(s).  I discussed the assessment and treatment plan with the patient. The patient was provided an opportunity to ask questions and all were answered. The patient agreed with the plan and demonstrated an understanding of the instructions.   The patient was advised to call back or seek an in-person evaluation if the symptoms worsen or if the condition fails to improve as anticipated.  I provided 22 minutes of non-face-to-face time during this encounter.   HLucretia Kern DO

## 2019-09-29 ENCOUNTER — Other Ambulatory Visit (INDEPENDENT_AMBULATORY_CARE_PROVIDER_SITE_OTHER): Payer: Medicare HMO

## 2019-09-29 ENCOUNTER — Other Ambulatory Visit: Payer: Self-pay

## 2019-09-29 DIAGNOSIS — M25551 Pain in right hip: Secondary | ICD-10-CM | POA: Diagnosis not present

## 2019-09-29 DIAGNOSIS — Z79899 Other long term (current) drug therapy: Secondary | ICD-10-CM | POA: Diagnosis not present

## 2019-09-29 DIAGNOSIS — E876 Hypokalemia: Secondary | ICD-10-CM

## 2019-09-29 DIAGNOSIS — E538 Deficiency of other specified B group vitamins: Secondary | ICD-10-CM | POA: Diagnosis not present

## 2019-09-29 DIAGNOSIS — G894 Chronic pain syndrome: Secondary | ICD-10-CM | POA: Diagnosis not present

## 2019-09-29 DIAGNOSIS — M5136 Other intervertebral disc degeneration, lumbar region: Secondary | ICD-10-CM | POA: Diagnosis not present

## 2019-09-29 DIAGNOSIS — M47817 Spondylosis without myelopathy or radiculopathy, lumbosacral region: Secondary | ICD-10-CM | POA: Diagnosis not present

## 2019-09-29 DIAGNOSIS — Z79891 Long term (current) use of opiate analgesic: Secondary | ICD-10-CM | POA: Diagnosis not present

## 2019-09-29 NOTE — Addendum Note (Signed)
Addended by: Marrion Coy on: 09/29/2019 11:34 AM   Modules accepted: Orders

## 2019-09-30 LAB — BASIC METABOLIC PANEL
BUN/Creatinine Ratio: 34 (calc) — ABNORMAL HIGH (ref 6–22)
BUN: 34 mg/dL — ABNORMAL HIGH (ref 7–25)
CO2: 21 mmol/L (ref 20–32)
Calcium: 9.2 mg/dL (ref 8.6–10.4)
Chloride: 101 mmol/L (ref 98–110)
Creat: 0.99 mg/dL (ref 0.50–1.05)
Glucose, Bld: 150 mg/dL — ABNORMAL HIGH (ref 65–99)
Potassium: 3.7 mmol/L (ref 3.5–5.3)
Sodium: 137 mmol/L (ref 135–146)

## 2019-09-30 LAB — VITAMIN B12: Vitamin B-12: 384 pg/mL (ref 200–1100)

## 2019-10-01 ENCOUNTER — Encounter: Payer: Self-pay | Admitting: Family Medicine

## 2019-10-02 NOTE — Telephone Encounter (Signed)
Pt calling back to follow up on message that was sent to Dr. Martinique via [place] on 10/01/19.

## 2019-10-15 ENCOUNTER — Other Ambulatory Visit: Payer: Self-pay | Admitting: Family Medicine

## 2019-10-15 DIAGNOSIS — E782 Mixed hyperlipidemia: Secondary | ICD-10-CM

## 2019-10-15 DIAGNOSIS — K219 Gastro-esophageal reflux disease without esophagitis: Secondary | ICD-10-CM

## 2019-12-02 ENCOUNTER — Other Ambulatory Visit: Payer: Self-pay | Admitting: Pain Medicine

## 2019-12-02 ENCOUNTER — Other Ambulatory Visit (HOSPITAL_COMMUNITY): Payer: Self-pay | Admitting: Pain Medicine

## 2019-12-02 DIAGNOSIS — M545 Low back pain, unspecified: Secondary | ICD-10-CM

## 2019-12-26 ENCOUNTER — Ambulatory Visit
Admission: RE | Admit: 2019-12-26 | Discharge: 2019-12-26 | Disposition: A | Discharge status: Home / Self Care | Payer: Medicare HMO | Source: Ambulatory Visit | Attending: Pain Medicine | Admitting: Pain Medicine

## 2019-12-26 DIAGNOSIS — M545 Low back pain, unspecified: Secondary | ICD-10-CM

## 2020-01-16 ENCOUNTER — Other Ambulatory Visit: Payer: Self-pay | Admitting: Family Medicine

## 2020-01-16 DIAGNOSIS — E876 Hypokalemia: Secondary | ICD-10-CM

## 2020-01-26 ENCOUNTER — Other Ambulatory Visit: Payer: Self-pay | Admitting: Family Medicine

## 2020-01-26 DIAGNOSIS — I10 Essential (primary) hypertension: Secondary | ICD-10-CM

## 2020-02-09 ENCOUNTER — Other Ambulatory Visit: Payer: Self-pay | Admitting: Family Medicine

## 2020-02-09 DIAGNOSIS — G894 Chronic pain syndrome: Secondary | ICD-10-CM

## 2020-03-02 ENCOUNTER — Other Ambulatory Visit: Payer: Self-pay | Admitting: Neurosurgery

## 2020-03-23 NOTE — H&P (Signed)
Patient ID:   [id number] Patient: Teresa Lee  Date of Birth: 03/01/1961 Visit Type: Office Visit   Date: 03/01/2020 01:00 PM Provider: Marchia Meiers. [name] MD   This 60 year old female presents for back and right leg pain.  HISTORY OF PRESENT ILLNESS: 1.  back and right leg pain  Teresa Lee is a 60 year old female who is referred for neurosurgical evaluation by Dr. David Stall at referred pain management due to complaints of low back pain and right lower extremity pain.  The patient reports that she has a chronic history of low back pain that has been progressively worsening over the last 2 years.  About 6 the 8 months ago, the patient began experiencing a significant worsening of her low back pain and began having pain radiating into her right buttock into her right leg all the way into her foot.  She also has complaints of left lateral thigh numbness and bilateral foot pain.  She currently rates her pain to be 7/10 and a 9 to 10/10 at its worst.  She describes her symptoms to be fairly constant.  Walking is said to increase her pain.  Lying down decreases her pain.  She has been receiving epidural steroid injections for many years and feels as though they are losing their efficacy.  She has a Medtronic spinal cord stimulator that was placed in a 2019 that she feels has become less effective.  She has done physical therapy in the past which helps minimally.  She also uses a back brace p.r.n.Marland Kitchen  She is currently taking hydrocodone and Robaxin which helps minimally.  Medications:  Hydrocodone 10/325 5X/day, methocarbamol 750 mg HS  Past medical history:  Myasthenia gravis, fibromyalgia, Crohn's, osteoarthritis, restless leg syndrome, asthma  Past surgical history:  thymectomy, cholecystectomy, bilateral carpal tunnel release       PAST MEDICAL/SURGICAL HISTORY:   (Reviewed, updated)   Disease/disorder Onset Date Management Date Comments IBS    CRR 12/05/2016 - Crohns  disease    CRR 12/05/2016 - High cholesterol    CRR 12/05/2016 - Cancer, skin    CRR 12/05/2016 -   Skin cancer removal  CRR 12/05/2016 -   Thymus gland removal  CRR 12/05/2016 -   Breast reduction  CRR 12/05/2016 -   Carpal tunnel release     Cholecystectomy   Arthritis     Gerd     Hypertension     Anxiety     Asthma        Family History:  (Reviewed, updated) Relationship Family Member Name Deceased Age at Death Condition Onset Age Cause of Death     Family history of Thyroid disorder  N     Family history of Bells Palsey  N     Family history of Graves disease  N     Family history of IBS  N     Family history of Stroke  N     Family history of Liver cancer  N     Family history of Hypertension  N     Family history of Lung cancer  N Father  Y  Tuberculosis  Y Mother    Lupus erythematosus 50 N Mother    Arthritis  N    Social History:  (Reviewed, updated) Tobacco use reviewed. Preferred language is Unknown.   Tobacco use status: Current non-smoker. Smoking status: Never smoker.  SMOKING STATUS Type Smoking Status Usage Per Day Years Used Total Pack Years  Never smoker  TOBACCO/VAPING EXPOSURE No passive smoke exposure.       MEDICATIONS: (added, continued or stopped this visit) Started Medication Directions Instruction Stopped  albuterol sulfate concentrate 2.5 mg/0.5 mL solution for nebulization inhale 0.5 milliliter by nebulization route 3 times every day as needed    CellCept 500 mg tablet take 2 tablet by oral route 2 times every day    citalopram 40 mg tablet take 1 tablet by oral route  every day    Delzicol 400 mg capsule (DR tablets inside) take 2 capsule by oral route 3 times every day    desmopressin 0.1 mg tablet take 0.38m  tablet by oral route daily    estradiol 2 mg tablet take 1  tablet by oral route  every day    klor-con M20 220m ORAL TABLET take 1 tablet by mouth daily    losartan 50 mg tablet take 1 tablet by oral route  every day    meloxicam 15 mg tablet take 1 tablet by oral route  every day    multivitamin tablet take 1 by oral route  every day    Norco 10 mg-325 mg tablet take 1 tablet by oral route  every 4 hours as needed for pain    pantoprazole 40 mg tablet,delayed release take 1 tablet by oral route  every day    prednisone 5 mg tablet take 1 tablet by oral route  every morning    progesterone micronized 100 mg capsule take 1 capsule by oral route  every day for in the evening    ropinirole 1 mg tablet take 1 tablet by oral route  every bedtime    triamterene 37.5 mg-hydrochlorothiazide 25 mg tablet take 1 tablet by oral route  every day    Zofran 4 mg tablet take 1 tablet by oral route  every 8 hours  as needed for nausea /vomiting      ALLERGIES: Ingredient Reaction Medication Name Comment LATEX    ADHESIVE TAPE    DULOXETINE HCL  Cymbalta  CITRUS AND DERIVATIVES    OXYCODONE HCL Hives Percocet  ACETAMINOPHEN Hives Percocet  SULFA (SULFONAMIDE ANTIBIOTICS) Hives   TAPENTADOL HCL Headache Nucynta   Reviewed, updated.    PHYSICAL EXAM:  Vitals Date Temp F BP Pulse Ht In Wt Lb BMI BSA Pain Score 03/01/2020  116/76 101 60 157.6 30.78  7/10   PHYSICAL EXAM Details General Level of Distress: no acute distress Overall Appearance: normal    Cardiovascular Cardiac: regular rate and rhythm without murmur  Respiratory Lungs: clear to auscultation  Neurological Recent and Remote Memory: normal Attention Span and Concentration:   normal Language: normal Fund of Knowledge: normal  Right Left Sensation: normal normal Upper Extremity Coordination: normal normal  Lower Extremity Coordination: normal normal  Musculoskeletal Gait and  Station: normal  Right Left Upper Extremity Muscle Strength: normal normal Lower Extremity Muscle Strength: normal normal Upper Extremity Muscle Tone:  normal normal Lower Extremity Muscle Tone: normal normal   Motor Strength Upper and lower extremity motor strength was tested in the clinically pertinent muscles.     Deep Tendon Reflexes  Right Left Biceps: normal normal Triceps: normal normal Brachioradialis: normal normal Patellar: normal normal Achilles: normal normal  Sensory Sensation was tested at L1 to S1.   Cranial Nerves II. Optic Nerve/Visual Fields: normal III. Oculomotor: normal IV. Trochlear: normal V. Trigeminal: normal VI. Abducens: normal VII. Facial: normal VIII. Acoustic/Vestibular: normal IX. Glossopharyngeal: normal X. Vagus: normal XI. Spinal Accessory: normal XII. Hypoglossal:  normal  Motor and other Tests Lhermittes: negative Rhomberg: negative    Right Left Hoffman's: normal normal Clonus: normal normal Babinski: normal normal SLR: negative negative Patrick's Corky Sox): negative negative Toe Walk: normal normal Toe Lift: normal normal Heel Walk: normal normal SI Joint: nontender nontender     DIAGNOSTIC RESULTS:  Lumbar MRI and four view lumbar radiographs reviewed with patient in the office today.  Patient has an anterolisthesis at the L4 on L5 level, neutral a 0.5 mm, extension 9 mm, flexion 10.7 mm.  At the L5-S1 level there is a right paracentral/sub articular herniated disc as well as a significant amount of adipose tissue in the spinal canal that is leading to multifactorial spinal stenosis.    IMPRESSION:  The patient has a history of chronic low back pain that has been progressively worsening over the last 2 years with a significant amount of worsening over the last 6-8 months.  Her pain is severe and intractable.  She has failed conservative treatment which has consisted of medications, epidural steroid injections, and  physical therapy.  The patient's response to epidural steroid injections has greatly decreased in the patient is getting very little benefit.  Her imaging is most remarkable for a herniated disc at the L5-S1 level as well as the significant amount of adipose tissue in the spinal canal that is leading to spinal stenosis.  There is also a significant anterolisthesis of L4 on L5 with mild spinal stenosis.  On exam she does have full strength on confrontational testing and a negative straight leg raise bilaterally.  Since the patient has been dealing with this for many years and has failed conservative treatment, I counseled the patient to undergo surgery.  I have recommended a right TLIFat the L4-5 level and a microdiskectomy at the right L5-S1 level.  Risks and benefits of the surgery were discussed with the patient in detail and she wishes to proceed.  PLAN: Proceed with right L4-5 TLIF and right microdiskectomy L5-S1.  Detailed patient Education was performed today and all of her questions were answered.  She was provided a prescription for an LSO brace.  We discussed the importance of weight loss.  Her most recent NCV/EMG study will be requested.  She will follow-up in the clinic 3 weeks after discharge from the hospital with a/P and lateral radiographs.  Orders: Diagnostic Procedures: Assessment Procedure M43.16 Lumbar Spine- AP/Lat M54.42 Lumbar Spine- AP/Lat/Flex/Ex Instruction(s)/Education: Assessment Instruction Z68.30 Lifestyle education regarding diet Miscellaneous: Assessment  M43.16 LSO Brace  Completed Orders (this encounter) Order Details Reason Side Interpretation Result Initial Treatment Date Region Lifestyle education regarding diet Encouraged patient to eat well balanced diet.       Lumbar Spine- AP/Lat/Flex/Ex      03/01/2020 All Levels to All Levels  Assessment/Plan  # Detail Type Description  1. Assessment Lumbar spondylosis  (M47.816).     2. Assessment Disc degeneration, lumbar (M51.36).     3. Assessment Chronic bilateral low back pain with bilateral sciatica (M54.42).     4. Assessment Lumbago with sciatica, right side (M54.41).     5. Assessment Other chronic pain (G89.29).     6. Assessment HNP (herniated nucleus pulposus), lumbar (M51.26).     7. Assessment Spondylolisthesis of lumbar region (M43.16).  Plan Orders LSO Brace.     8. Assessment Body mass index (BMI) 30.0-30.9, adult (Z68.30).  Plan Orders Today's instructions / counseling include(s) Lifestyle education regarding diet. Clinical information/comments: Encouraged patient to eat well balanced diet.  Pain Management Plan Pain Scale: 7/10. Method: Numeric Pain Intensity Scale. Location: back. Onset: 04/06/2016. Duration: varies. Quality: discomforting. Pain management follow-up plan of care: Patient will continue medication management..              Provider:  Marchia Meiers. [name] MD  03/01/2020 03:43 PM    Dictation edited by: Fenton Malling, NP    CC Providers: [facility] [place] [address] [place],  [place]  [phone]   Dian Situ  [facility] [address] [place], [place] [address]-               Electronically signed by Fenton Malling NP on 03/01/2020 03:43 PM  on behalf of Marchia Meiers. [name] MD

## 2020-04-02 ENCOUNTER — Other Ambulatory Visit: Payer: Self-pay | Admitting: Family Medicine

## 2020-04-02 DIAGNOSIS — G2581 Restless legs syndrome: Secondary | ICD-10-CM

## 2020-04-14 ENCOUNTER — Other Ambulatory Visit: Payer: Self-pay | Admitting: Family Medicine

## 2020-04-15 ENCOUNTER — Other Ambulatory Visit: Payer: Self-pay | Admitting: Family Medicine

## 2020-04-15 DIAGNOSIS — E876 Hypokalemia: Secondary | ICD-10-CM

## 2020-04-27 NOTE — H&P (Signed)
Patient ID:   [id number] Patient: Teresa Lee  Date of Birth: [birth year] Visit Type: Office Visit   Date: 03/01/2020 01:00 PM Provider: Marchia Meiers. [name] MD   This 60 year old female presents for back and right leg pain.  HISTORY OF PRESENT ILLNESS: 1.  back and right leg pain  Teresa Lee is a 60 year old female who is referred for neurosurgical evaluation by Dr. David Stall at referred pain management due to complaints of low back pain and right lower extremity pain.  The patient reports that she has a chronic history of low back pain that has been progressively worsening over the last 2 years.  About 6 the 8 months ago, the patient began experiencing a significant worsening of her low back pain and began having pain radiating into her right buttock into her right leg all the way into her foot.  She also has complaints of left lateral thigh numbness and bilateral foot pain.  She currently rates her pain to be 7/10 and a 9 to 10/10 at its worst.  She describes her symptoms to be fairly constant.  Walking is said to increase her pain.  Lying down decreases her pain.  She has been receiving epidural steroid injections for many years and feels as though they are losing their efficacy.  She has a Medtronic spinal cord stimulator that was placed in a 2019 that she feels has become less effective.  She has done physical therapy in the past which helps minimally.  She also uses a back brace p.r.n.Marland Kitchen  She is currently taking hydrocodone and Robaxin which helps minimally.  Medications:  Hydrocodone 10/325 5X/day, methocarbamol 750 mg HS  Past medical history:  Myasthenia gravis, fibromyalgia, Crohn's, osteoarthritis, restless leg syndrome, asthma  Past surgical history:  thymectomy, cholecystectomy, bilateral carpal tunnel release       PAST MEDICAL/SURGICAL HISTORY:   (Reviewed, updated)   Disease/disorder Onset Date Management Date Comments IBS    CRR 12/05/2016 - Crohns  disease    CRR 12/05/2016 - High cholesterol    CRR 12/05/2016 - Cancer, skin    CRR 12/05/2016 -   Skin cancer removal  CRR 12/05/2016 -   Thymus gland removal  CRR 12/05/2016 -   Breast reduction  CRR 12/05/2016 -   Carpal tunnel release     Cholecystectomy   Arthritis     Gerd     Hypertension     Anxiety     Asthma        Family History:  (Reviewed, updated) Relationship Family Member Name Deceased Age at Death Condition Onset Age Cause of Death     Family history of Thyroid disorder  N     Family history of Bells Palsey  N     Family history of Graves disease  N     Family history of IBS  N     Family history of Stroke  N     Family history of Liver cancer  N     Family history of Hypertension  N     Family history of Lung cancer  N Father  Y  Tuberculosis  Y Mother    Lupus erythematosus 32 N Mother    Arthritis  N    Social History:  (Reviewed, updated) Tobacco use reviewed. Preferred language is Unknown.   Tobacco use status: Current non-smoker. Smoking status: Never smoker.  SMOKING STATUS Type Smoking Status Usage Per Day Years Used Total Pack Years  Never smoker  TOBACCO/VAPING EXPOSURE No passive smoke exposure.       MEDICATIONS: (added, continued or stopped this visit) Started Medication Directions Instruction Stopped  albuterol sulfate concentrate 2.5 mg/0.5 mL solution for nebulization inhale 0.5 milliliter by nebulization route 3 times every day as needed    CellCept 500 mg tablet take 2 tablet by oral route 2 times every day    citalopram 40 mg tablet take 1 tablet by oral route  every day    Delzicol 400 mg capsule (DR tablets inside) take 2 capsule by oral route 3 times every day    desmopressin 0.1 mg tablet take 0.64m  tablet by oral route daily    estradiol 2 mg tablet take 1  tablet by oral route  every day    klor-con M20 265m ORAL TABLET take 1 tablet by mouth daily    losartan 50 mg tablet take 1 tablet by oral route  every day    meloxicam 15 mg tablet take 1 tablet by oral route  every day    multivitamin tablet take 1 by oral route  every day    Norco 10 mg-325 mg tablet take 1 tablet by oral route  every 4 hours as needed for pain    pantoprazole 40 mg tablet,delayed release take 1 tablet by oral route  every day    prednisone 5 mg tablet take 1 tablet by oral route  every morning    progesterone micronized 100 mg capsule take 1 capsule by oral route  every day for in the evening    ropinirole 1 mg tablet take 1 tablet by oral route  every bedtime    triamterene 37.5 mg-hydrochlorothiazide 25 mg tablet take 1 tablet by oral route  every day    Zofran 4 mg tablet take 1 tablet by oral route  every 8 hours  as needed for nausea /vomiting      ALLERGIES: Ingredient Reaction Medication Name Comment LATEX    ADHESIVE TAPE    DULOXETINE HCL  Cymbalta  CITRUS AND DERIVATIVES    OXYCODONE HCL Hives Percocet  ACETAMINOPHEN Hives Percocet  SULFA (SULFONAMIDE ANTIBIOTICS) Hives   TAPENTADOL HCL Headache Nucynta   Reviewed, updated.    PHYSICAL EXAM:  Vitals Date Temp F BP Pulse Ht In Wt Lb BMI BSA Pain Score 03/01/2020  116/76 101 60 157.6 30.78  7/10   PHYSICAL EXAM Details General Level of Distress: no acute distress Overall Appearance: normal    Cardiovascular Cardiac: regular rate and rhythm without murmur  Respiratory Lungs: clear to auscultation  Neurological Recent and Remote Memory: normal Attention Span and Concentration:   normal Language: normal Fund of Knowledge: normal  Right Left Sensation: normal normal Upper Extremity Coordination: normal normal  Lower Extremity Coordination: normal normal  Musculoskeletal Gait and  Station: normal  Right Left Upper Extremity Muscle Strength: normal normal Lower Extremity Muscle Strength: normal normal Upper Extremity Muscle Tone:  normal normal Lower Extremity Muscle Tone: normal normal   Motor Strength Upper and lower extremity motor strength was tested in the clinically pertinent muscles.     Deep Tendon Reflexes  Right Left Biceps: normal normal Triceps: normal normal Brachioradialis: normal normal Patellar: normal normal Achilles: normal normal  Sensory Sensation was tested at L1 to S1.   Cranial Nerves II. Optic Nerve/Visual Fields: normal III. Oculomotor: normal IV. Trochlear: normal V. Trigeminal: normal VI. Abducens: normal VII. Facial: normal VIII. Acoustic/Vestibular: normal IX. Glossopharyngeal: normal X. Vagus: normal XI. Spinal Accessory: normal XII. Hypoglossal:  normal  Motor and other Tests Lhermittes: negative Rhomberg: negative    Right Left Hoffman's: normal normal Clonus: normal normal Babinski: normal normal SLR: negative negative Patrick's Corky Sox): negative negative Toe Walk: normal normal Toe Lift: normal normal Heel Walk: normal normal SI Joint: nontender nontender     DIAGNOSTIC RESULTS:  Lumbar MRI and four view lumbar radiographs reviewed with patient in the office today.  Patient has an anterolisthesis at the L4 on L5 level, neutral a 0.5 mm, extension 9 mm, flexion 10.7 mm.  At the L5-S1 level there is a right paracentral/sub articular herniated disc as well as a significant amount of adipose tissue in the spinal canal that is leading to multifactorial spinal stenosis.    IMPRESSION:  The patient has a history of chronic low back pain that has been progressively worsening over the last 2 years with a significant amount of worsening over the last 6-8 months.  Her pain is severe and intractable.  She has failed conservative treatment which has consisted of medications, epidural steroid injections, and  physical therapy.  The patient's response to epidural steroid injections has greatly decreased in the patient is getting very little benefit.  Her imaging is most remarkable for a herniated disc at the L5-S1 level as well as the significant amount of adipose tissue in the spinal canal that is leading to spinal stenosis.  There is also a significant anterolisthesis of L4 on L5 with mild spinal stenosis.  On exam she does have full strength on confrontational testing and a negative straight leg raise bilaterally.  Since the patient has been dealing with this for many years and has failed conservative treatment, I counseled the patient to undergo surgery.  I have recommended a right TLIFat the L4-5 level and a microdiskectomy at the right L5-S1 level.  Risks and benefits of the surgery were discussed with the patient in detail and she wishes to proceed.  PLAN: Proceed with right L4-5 TLIF and right microdiskectomy L5-S1.  Detailed patient Education was performed today and all of her questions were answered.  She was provided a prescription for an LSO brace.  We discussed the importance of weight loss.  Her most recent NCV/EMG study will be requested.  She will follow-up in the clinic 3 weeks after discharge from the hospital with a/P and lateral radiographs.  Orders: Diagnostic Procedures: Assessment Procedure M43.16 Lumbar Spine- AP/Lat M54.42 Lumbar Spine- AP/Lat/Flex/Ex Instruction(s)/Education: Assessment Instruction Z68.30 Lifestyle education regarding diet Miscellaneous: Assessment  M43.16 LSO Brace  Completed Orders (this encounter) Order Details Reason Side Interpretation Result Initial Treatment Date Region Lifestyle education regarding diet Encouraged patient to eat well balanced diet.       Lumbar Spine- AP/Lat/Flex/Ex      03/01/2020 All Levels to All Levels  Assessment/Plan  # Detail Type Description  1. Assessment Lumbar spondylosis  (M47.816).     2. Assessment Disc degeneration, lumbar (M51.36).     3. Assessment Chronic bilateral low back pain with bilateral sciatica (M54.42).     4. Assessment Lumbago with sciatica, right side (M54.41).     5. Assessment Other chronic pain (G89.29).     6. Assessment HNP (herniated nucleus pulposus), lumbar (M51.26).     7. Assessment Spondylolisthesis of lumbar region (M43.16).  Plan Orders LSO Brace.     8. Assessment Body mass index (BMI) 30.0-30.9, adult (Z68.30).  Plan Orders Today's instructions / counseling include(s) Lifestyle education regarding diet. Clinical information/comments: Encouraged patient to eat well balanced diet.  Pain Management Plan Pain Scale: 7/10. Method: Numeric Pain Intensity Scale. Location: back. Onset: 04/06/2016. Duration: varies. Quality: discomforting. Pain management follow-up plan of care: Patient will continue medication management..              Provider:  Marchia Meiers. [name] MD  03/01/2020 03:43 PM    Dictation edited by: Fenton Malling, NP    CC Providers: [place] [facility] [address] [place],  [place]  [phone]   Dian Situ  [facility] [address] [place], [place] [address]-               Electronically signed by Fenton Malling NP on 03/01/2020 03:43 PM  on behalf of Marchia Meiers. [name] MD

## 2020-04-30 ENCOUNTER — Other Ambulatory Visit: Payer: Self-pay

## 2020-04-30 ENCOUNTER — Encounter (HOSPITAL_COMMUNITY)
Admission: RE | Admit: 2020-04-30 | Discharge: 2020-04-30 | Disposition: A | Discharge status: Home / Self Care | Payer: Medicare HMO | Source: Ambulatory Visit | Attending: Neurosurgery | Admitting: Neurosurgery

## 2020-04-30 ENCOUNTER — Other Ambulatory Visit (HOSPITAL_COMMUNITY)
Admission: RE | Admit: 2020-04-30 | Discharge: 2020-04-30 | Disposition: A | Discharge status: Home / Self Care | Payer: Medicare HMO | Source: Ambulatory Visit | Attending: Neurosurgery | Admitting: Neurosurgery

## 2020-04-30 ENCOUNTER — Encounter (HOSPITAL_COMMUNITY): Payer: Self-pay

## 2020-04-30 DIAGNOSIS — Z01812 Encounter for preprocedural laboratory examination: Secondary | ICD-10-CM | POA: Insufficient documentation

## 2020-04-30 DIAGNOSIS — Z7952 Long term (current) use of systemic steroids: Secondary | ICD-10-CM | POA: Diagnosis not present

## 2020-04-30 DIAGNOSIS — H02402 Unspecified ptosis of left eyelid: Secondary | ICD-10-CM | POA: Diagnosis not present

## 2020-04-30 DIAGNOSIS — Z9682 Presence of neurostimulator: Secondary | ICD-10-CM | POA: Insufficient documentation

## 2020-04-30 DIAGNOSIS — M5416 Radiculopathy, lumbar region: Secondary | ICD-10-CM | POA: Insufficient documentation

## 2020-04-30 DIAGNOSIS — G7000 Myasthenia gravis without (acute) exacerbation: Secondary | ICD-10-CM | POA: Insufficient documentation

## 2020-04-30 DIAGNOSIS — Z20822 Contact with and (suspected) exposure to covid-19: Secondary | ICD-10-CM | POA: Insufficient documentation

## 2020-04-30 DIAGNOSIS — Z79899 Other long term (current) drug therapy: Secondary | ICD-10-CM | POA: Diagnosis not present

## 2020-04-30 DIAGNOSIS — Z8616 Personal history of COVID-19: Secondary | ICD-10-CM | POA: Diagnosis not present

## 2020-04-30 DIAGNOSIS — M6281 Muscle weakness (generalized): Secondary | ICD-10-CM | POA: Diagnosis not present

## 2020-04-30 HISTORY — DX: Pneumonia, unspecified organism: J18.9

## 2020-04-30 LAB — BASIC METABOLIC PANEL
Anion gap: 17 — ABNORMAL HIGH (ref 5–15)
BUN: 18 mg/dL (ref 6–20)
CO2: 22 mmol/L (ref 22–32)
Calcium: 9.4 mg/dL (ref 8.9–10.3)
Chloride: 98 mmol/L (ref 98–111)
Creatinine, Ser: 0.95 mg/dL (ref 0.44–1.00)
GFR, Estimated: >60 mL/min (ref >60)
Glucose, Bld: 136 mg/dL — ABNORMAL HIGH (ref 70–99)
Potassium: 3.3 mmol/L — ABNORMAL LOW (ref 3.5–5.1)
Sodium: 137 mmol/L (ref 135–145)

## 2020-04-30 LAB — CBC
HCT: 39.4 % (ref 36.0–46.0)
Hemoglobin: 13.7 g/dL (ref 12.0–15.0)
MCH: 32.7 pg (ref 26.0–34.0)
MCHC: 34.8 g/dL (ref 30.0–36.0)
MCV: 94 fL (ref 80.0–100.0)
Platelets: 222 10*3/uL (ref 150–400)
RBC: 4.19 MIL/uL (ref 3.87–5.11)
RDW: 14.5 % (ref 11.5–15.5)
WBC: 16.1 10*3/uL — ABNORMAL HIGH (ref 4.0–10.5)
nRBC: 0 % (ref 0.0–0.2)

## 2020-04-30 LAB — TYPE AND SCREEN
ABO/RH(D): A POS
Antibody Screen: NEGATIVE

## 2020-04-30 LAB — SARS CORONAVIRUS 2 (TAT 6-24 HRS): SARS Coronavirus 2: NEGATIVE

## 2020-04-30 LAB — SURGICAL PCR SCREEN
MRSA, PCR: NEGATIVE
Staphylococcus aureus: NEGATIVE

## 2020-04-30 NOTE — Progress Notes (Signed)
Surgical Instructions    Your procedure is scheduled on Tuesday, February 15th.  Report to [facility] Main Entrance "A" at 5:30 A.M., then check in with the Admitting office.  Call this number if you have problems the morning of surgery:  [phone]   If you have any questions prior to your surgery date call [phone]: Open Monday-Friday 8am-4pm    Remember:  Do not eat or drink after midnight the night before your surgery    Take these medicines the morning of surgery with A SIP OF WATER   Albuterol nebulizer - if needed  Albuterol inhaler - if needed  Alprazolam (Xanax)  Hydrocodone-Acetaminophen  Cellcept  Zofran  Pantoprazole (Protonix)  Prednisone  Pyridostigmine (Mestinon)  Rosuvastatin (Crestor)  Symbicort inhaler    As of today, STOP taking any Aspirin (unless otherwise instructed by your surgeon) Aleve, Naproxen, Ibuprofen, Motrin, Advil, Goody's, BC's, all herbal medications, fish oil, and all vitamins.                     Do not wear jewelry, make up, or nail polish            Do not wear lotions, powders, perfumes, or deodorant.            Do not shave 48 hours prior to surgery.              Do not bring valuables to the hospital.            [facility] is not responsible for any belongings or valuables.  Do NOT Smoke (Tobacco/Vaping) or drink Alcohol 24 hours prior to your procedure If you use a CPAP at night, you may bring all equipment for your overnight stay.   Contacts, glasses, dentures or bridgework may not be worn into surgery, please bring cases for these belongings   For patients admitted to the hospital, discharge time will be determined by your treatment team.   Patients discharged the day of surgery will not be allowed to drive home, and someone needs to stay with them for 24 hours.    Special instructions:   [facility]- Preparing For Surgery  Before surgery, you can play an important role. Because skin is not sterile, your skin needs  to be as free of germs as possible. You can reduce the number of germs on your skin by washing with CHG (chlorahexidine gluconate) Soap before surgery.  CHG is an antiseptic cleaner which kills germs and bonds with the skin to continue killing germs even after washing.    Oral Hygiene is also important to reduce your risk of infection.  Remember - BRUSH YOUR TEETH THE MORNING OF SURGERY WITH YOUR REGULAR TOOTHPASTE  Please do not use if you have an allergy to CHG or antibacterial soaps. If your skin becomes reddened/irritated stop using the CHG.  Do not shave (including legs and underarms) for at least 48 hours prior to first CHG shower. It is OK to shave your face.  Please follow these instructions carefully.   1. Shower the NIGHT BEFORE SURGERY and the MORNING OF SURGERY  2. If you chose to wash your hair, wash your hair first as usual with your normal shampoo.  3. After you shampoo, rinse your hair and body thoroughly to remove the shampoo.  4. Wash Face and genitals (private parts) with your normal soap.   5.  Shower the NIGHT BEFORE SURGERY and the MORNING OF SURGERY with CHG Soap.   6.  Use CHG Soap as you would any other liquid soap. You can apply CHG directly to the skin and wash gently with a scrungie or a clean washcloth.   7. Apply the CHG Soap to your body ONLY FROM THE NECK DOWN.  Do not use on open wounds or open sores. Avoid contact with your eyes, ears, mouth and genitals (private parts). Wash Face and genitals (private parts)  with your normal soap.   8. Wash thoroughly, paying special attention to the area where your surgery will be performed.  9. Thoroughly rinse your body with warm water from the neck down.  10. DO NOT shower/wash with your normal soap after using and rinsing off the CHG Soap.  11. Pat yourself dry with a CLEAN TOWEL.  12. Wear CLEAN PAJAMAS to bed the night before surgery  13. Place CLEAN SHEETS on your bed the night before your surgery  14. DO  NOT SLEEP WITH PETS.   Day of Surgery: Wear Clean/Comfortable clothing the morning of surgery Do not apply any deodorants/lotions.   Remember to brush your teeth WITH YOUR REGULAR TOOTHPASTE.   Please read over the following fact sheets that you were given.

## 2020-04-30 NOTE — Progress Notes (Signed)
PCP - Anastasia Pall Cardiologist - denies  Chest x-ray - n/a EKG - 1/6 (Dr. Fayrene Fearing office) - requested EKG tracing ECHO - 02/21/19  COVID TEST- 2/11   Anesthesia review: n/a  Patient denies shortness of breath, fever, cough and chest pain at PAT appointment   All instructions explained to the patient, with a verbal understanding of the material. Patient agrees to go over the instructions while at home for a better understanding. Patient also instructed to self quarantine after being tested for COVID-19. The opportunity to ask questions was provided.   Received medical clearance from Dr. Melford Aase (Care Everywhere) - 03/25/20 Requested EKG tracing from Dr. Melford Aase - if not received, need to repeat EKG on DOS  Patient has spinal cord stimulator - instructed her to call Dr. Melven Sartorius office in regards to turning it off, keeping it on, etc.

## 2020-05-03 NOTE — Progress Notes (Signed)
Anesthesia Chart Review:  Follows with neurology at [facility] for history of myasthenia gravis.  Disease history per most recent neurology note 04/02/2019, "Her initial symptoms began in 1982 with bilateral leg weakness and ptosis following delivery of a child. She was subsequently treated with thymectomy and low dose prednisone. In 2008, MMF was added to her regimen after she developed bilateral footdrop related to myasthenia gravis. She is currently treated with MMF 579m BID and prednisone 549mdaily. Ms. GrHensarlingeveloped mild left eyelid ptosis July 2016 that has been persistent. Several weeks ago, she noted increased fatigable proximal upper limb weakness and lower limb fatigable weakness with right greater than left foot drop. She is pending lumbar spine imaging for assessment of right L4/5 radiculopathy. Due to the new symptoms, Prednisone was advanced to 10 mg daily from 7.5 mg daily in 03/2018 and MMF was advanced to 1500 mg daily from 1000 mg daily about three weeks ago without change in her fatigable weakness."  At that visit her MMF was increased to 1000 mg twice daily and she was advised to continue prednisone 10 mg daily.  [facility] and AKI, which fortunately did not precipitate a myasthenic crisis.   Patient was seen and cleared for surgery by her PCP Dr. BaMelford Aasen 03/25/2020.  History of spinal cord stimulator implant 2019.  Preop labs reviewed, WBC mildly elevated at 16.1 (review of labs shows chronic mild elevation of WBC), otherwise unremarkable.  EKG 03/25/2020 (Care Everywhere, narrative only, tracing requested): NSR.  No evidence of ischemia.  No ectopy.  TTE 02/21/2019: 1. Left ventricular ejection fraction, by visual estimation, is 60 to  65%. The left ventricle has normal function. There is no left ventricular  hypertrophy.  2. Global right ventricle has normal systolic function.The right  ventricular size is normal. No  increase in right ventricular wall  thickness.  3. Left atrial size was normal.  4. Right atrial size was normal.  5. The mitral valve is normal in structure. Mild mitral valve  regurgitation.  6. The tricuspid valve is grossly normal. Tricuspid valve regurgitation  is trivial.  7. The aortic valve is grossly normal. Aortic valve regurgitation is  mild.  8. The pulmonic valve was grossly normal. Pulmonic valve regurgitation is  trivial.  9. Normal pulmonary artery systolic pressure.  10. The atrial septum is grossly normal.   Nuclear stress test 08/06/2013: Overall Impression:  Low risk stress nuclear study with anterior breast attenuation artifact.  LV Ejection Fraction: 72%.  LV Wall Motion:  NL LV Function; NL Wall Motion    JaWynonia MustyCShands [facility]/Anesthesiology Phone ([phone]/14/2022 9:18 AM

## 2020-05-03 NOTE — Anesthesia Preprocedure Evaluation (Addendum)
Anesthesia Evaluation  Patient identified by MRN, date of birth, ID band Patient awake    Airway Mallampati: II  TM Distance: >3 FB Neck ROM: Full    Dental  (+) Teeth Intact   Pulmonary asthma ,    Pulmonary exam normal        Cardiovascular hypertension, Pt. on medications  Rhythm:Regular Rate:Normal     Neuro/Psych Anxiety  Neuromuscular disease (MG)    GI/Hepatic Neg liver ROS, GERD  Medicated and Controlled,IBS   Endo/Other  negative endocrine ROS  Renal/GU Renal disease  negative genitourinary   Musculoskeletal  (+) Arthritis , Osteoarthritis,  Fibromyalgia -, narcotic dependentLumbar spondylolisthesis, herniated disc     Abdominal (+)  Abdomen: soft. Bowel sounds: normal.  Peds  Hematology negative hematology ROS (+)   Anesthesia Other Findings   Reproductive/Obstetrics                           Anesthesia Physical Anesthesia Plan  ASA: III  Anesthesia Plan: General   Post-op Pain Management:    Induction: Intravenous  PONV Risk Score and Plan: 3 and Ondansetron, Dexamethasone, Midazolam and Treatment may vary due to age or medical condition  Airway Management Planned: Mask and Oral ETT  Additional Equipment: None  Intra-op Plan:   Post-operative Plan: Extubation in OR  Informed Consent: I have reviewed the patients History and Physical, chart, labs and discussed the procedure including the risks, benefits and alternatives for the proposed anesthesia with the patient or authorized representative who has indicated his/her understanding and acceptance.     Dental advisory given  Plan Discussed with: CRNA  Anesthesia Plan Comments: (PAT note by Karoline Caldwell, PA-C: Follows with neurology at [facility] for history of myasthenia gravis.  Disease history per most recent neurology note 04/02/2019, "Her initial symptoms began in 1982 with bilateral leg weakness and ptosis following  delivery of a child. She was subsequently treated with thymectomy and low dose prednisone. In 2008, MMF was added to her regimen after she developed bilateral footdrop related to myasthenia gravis. She is currently treated with MMF 500mg  BID and prednisone 5mg  daily. Ms. Creswell developed mild left eyelid ptosis July 2016 that has been persistent. Several weeks ago, she noted increased fatigable proximal upper limb weakness and lower limb fatigable weakness with right greater than left foot drop. She is pending lumbar spine imaging for assessment of right L4/5 radiculopathy. Due to the new symptoms, Prednisone was advanced to 10 mg daily from 7.5 mg daily in 03/2018 and MMF was advanced to 1500 mg daily from 1000 mg daily about three weeks ago without change in her fatigable weakness."  At that visit her MMF was increased to 1000 mg twice daily and she was advised to continue prednisone 10 mg daily.  She was admitted to [facility] in December 2020 with Covid pneumonia and AKI, which fortunately did not precipitate a myasthenic crisis.   Patient was seen and cleared for surgery by her PCP Dr. Melford Aase on 03/25/2020.  History of spinal cord stimulator implant 2019.  Preop labs reviewed, WBC mildly elevated at 16.1 (review of labs shows chronic mild elevation of WBC), otherwise unremarkable.  EKG 03/25/2020 (Care Everywhere, narrative only, tracing requested): NSR.  No evidence of ischemia.  No ectopy.  TTE 02/21/2019: 1. Left ventricular ejection fraction, by visual estimation, is 60 to  65%. The left ventricle has normal function. There is no left ventricular  hypertrophy.  2. Global right ventricle  has normal systolic function.The right  ventricular size is normal. No increase in right ventricular wall  thickness.  3. Left atrial size was normal.  4. Right atrial size was normal.  5. The mitral valve is normal in structure. Mild mitral valve  regurgitation.  6. The tricuspid valve is grossly  normal. Tricuspid valve regurgitation  is trivial.  7. The aortic valve is grossly normal. Aortic valve regurgitation is  mild.  8. The pulmonic valve was grossly normal. Pulmonic valve regurgitation is  trivial.  9. Normal pulmonary artery systolic pressure.  10. The atrial septum is grossly normal.   Nuclear stress test 08/06/2013: Overall Impression:  Low risk stress nuclear study with anterior breast attenuation artifact.  LV Ejection Fraction: 72%.  LV Wall Motion:  NL LV Function; NL Wall Motion Lab Results      Component                Value               Date                      WBC                      16.1 (H)            04/30/2020                HGB                      13.7                04/30/2020                HCT                      39.4                04/30/2020                MCV                      94.0                04/30/2020                PLT                      222                 04/30/2020           Lab Results      Component                Value               Date                      NA                       137                 04/30/2020                K  3.3 (L)             04/30/2020                CO2                      22                  04/30/2020                GLUCOSE                  136 (H)             04/30/2020                BUN                      18                  04/30/2020                CREATININE               0.95                04/30/2020                CALCIUM                  9.4                 04/30/2020                GFRNONAA                 >60                 04/30/2020                GFRAA                    >60                 02/25/2019           )      Anesthesia Quick Evaluation

## 2020-05-04 ENCOUNTER — Encounter (HOSPITAL_COMMUNITY): Payer: Self-pay | Admitting: Neurosurgery

## 2020-05-04 ENCOUNTER — Inpatient Hospital Stay (HOSPITAL_COMMUNITY): Payer: Medicare HMO

## 2020-05-04 ENCOUNTER — Other Ambulatory Visit: Payer: Self-pay

## 2020-05-04 ENCOUNTER — Inpatient Hospital Stay (HOSPITAL_COMMUNITY): Payer: Medicare HMO | Admitting: Certified Registered Nurse Anesthetist

## 2020-05-04 ENCOUNTER — Encounter (HOSPITAL_COMMUNITY)
Admission: RE | Disposition: A | Discharge status: Home / Self Care | Payer: Self-pay | Source: Home / Self Care | Attending: Neurosurgery

## 2020-05-04 ENCOUNTER — Inpatient Hospital Stay (HOSPITAL_COMMUNITY): Payer: Medicare HMO | Admitting: Physician Assistant

## 2020-05-04 ENCOUNTER — Inpatient Hospital Stay (HOSPITAL_COMMUNITY)
Admission: RE | Admit: 2020-05-04 | Discharge: 2020-05-06 | DRG: 454 | Disposition: A | Discharge status: Home / Self Care | Payer: Medicare HMO | Attending: Neurosurgery | Admitting: Neurosurgery

## 2020-05-04 DIAGNOSIS — Z885 Allergy status to narcotic agent status: Secondary | ICD-10-CM | POA: Diagnosis not present

## 2020-05-04 DIAGNOSIS — M1990 Unspecified osteoarthritis, unspecified site: Secondary | ICD-10-CM | POA: Diagnosis present

## 2020-05-04 DIAGNOSIS — Z888 Allergy status to other drugs, medicaments and biological substances status: Secondary | ICD-10-CM

## 2020-05-04 DIAGNOSIS — K5090 Crohn's disease, unspecified, without complications: Secondary | ICD-10-CM | POA: Diagnosis present

## 2020-05-04 DIAGNOSIS — Z419 Encounter for procedure for purposes other than remedying health state, unspecified: Secondary | ICD-10-CM

## 2020-05-04 DIAGNOSIS — M4316 Spondylolisthesis, lumbar region: Secondary | ICD-10-CM | POA: Diagnosis present

## 2020-05-04 DIAGNOSIS — Z79890 Hormone replacement therapy (postmenopausal): Secondary | ICD-10-CM | POA: Diagnosis not present

## 2020-05-04 DIAGNOSIS — G8929 Other chronic pain: Secondary | ICD-10-CM | POA: Diagnosis present

## 2020-05-04 DIAGNOSIS — Z91040 Latex allergy status: Secondary | ICD-10-CM | POA: Diagnosis not present

## 2020-05-04 DIAGNOSIS — Z791 Long term (current) use of non-steroidal anti-inflammatories (NSAID): Secondary | ICD-10-CM

## 2020-05-04 DIAGNOSIS — G7000 Myasthenia gravis without (acute) exacerbation: Secondary | ICD-10-CM | POA: Diagnosis present

## 2020-05-04 DIAGNOSIS — F419 Anxiety disorder, unspecified: Secondary | ICD-10-CM | POA: Diagnosis present

## 2020-05-04 DIAGNOSIS — Z79899 Other long term (current) drug therapy: Secondary | ICD-10-CM | POA: Diagnosis not present

## 2020-05-04 DIAGNOSIS — Z7952 Long term (current) use of systemic steroids: Secondary | ICD-10-CM

## 2020-05-04 DIAGNOSIS — M5116 Intervertebral disc disorders with radiculopathy, lumbar region: Secondary | ICD-10-CM | POA: Diagnosis present

## 2020-05-04 DIAGNOSIS — M48061 Spinal stenosis, lumbar region without neurogenic claudication: Secondary | ICD-10-CM | POA: Diagnosis present

## 2020-05-04 DIAGNOSIS — G2581 Restless legs syndrome: Secondary | ICD-10-CM | POA: Diagnosis present

## 2020-05-04 DIAGNOSIS — M797 Fibromyalgia: Secondary | ICD-10-CM | POA: Diagnosis present

## 2020-05-04 DIAGNOSIS — Z91048 Other nonmedicinal substance allergy status: Secondary | ICD-10-CM

## 2020-05-04 DIAGNOSIS — Z882 Allergy status to sulfonamides status: Secondary | ICD-10-CM

## 2020-05-04 DIAGNOSIS — J45909 Unspecified asthma, uncomplicated: Secondary | ICD-10-CM | POA: Diagnosis present

## 2020-05-04 DIAGNOSIS — M5117 Intervertebral disc disorders with radiculopathy, lumbosacral region: Secondary | ICD-10-CM | POA: Diagnosis present

## 2020-05-04 HISTORY — PX: TRANSFORAMINAL LUMBAR INTERBODY FUSION (TLIF) WITH PEDICLE SCREW FIXATION 1 LEVEL: SHX6141

## 2020-05-04 HISTORY — PX: LUMBAR LAMINECTOMY/DECOMPRESSION MICRODISCECTOMY: SHX5026

## 2020-05-04 SURGERY — TRANSFORAMINAL LUMBAR INTERBODY FUSION (TLIF) WITH PEDICLE SCREW FIXATION 1 LEVEL
Anesthesia: General | Site: Spine Lumbar | Laterality: Right

## 2020-05-04 MED ORDER — FENTANYL CITRATE (PF) 100 MCG/2ML IJ SOLN
INTRAMUSCULAR | Status: AC
  Filled 2020-05-04: qty 2

## 2020-05-04 MED ORDER — ONDANSETRON HCL 4 MG/2ML IJ SOLN
INTRAMUSCULAR | Status: AC
  Filled 2020-05-04: qty 2

## 2020-05-04 MED ORDER — SUGAMMADEX SODIUM 200 MG/2ML IV SOLN
INTRAVENOUS | Status: DC | PRN
  Administered 2020-05-04: 50 mg via INTRAVENOUS
  Administered 2020-05-04: 200 mg via INTRAVENOUS

## 2020-05-04 MED ORDER — LIDOCAINE 2% (20 MG/ML) 5 ML SYRINGE
INTRAMUSCULAR | Status: DC | PRN
  Administered 2020-05-04: 80 mg via INTRAVENOUS

## 2020-05-04 MED ORDER — ALUM & MAG HYDROXIDE-SIMETH 200-200-20 MG/5ML PO SUSP: 30.0000 mL | Freq: Four times a day (QID) | ORAL | Status: DC | PRN

## 2020-05-04 MED ORDER — PREDNISONE 10 MG PO TABS
10.0000 mg | ORAL_TABLET | Freq: Every day | ORAL | Status: DC
  Administered 2020-05-05: 10 mg via ORAL
  Filled 2020-05-04 (×2): qty 1

## 2020-05-04 MED ORDER — DEXAMETHASONE SODIUM PHOSPHATE 10 MG/ML IJ SOLN
INTRAMUSCULAR | Status: AC
  Filled 2020-05-04: qty 1

## 2020-05-04 MED ORDER — CHLORHEXIDINE GLUCONATE CLOTH 2 % EX PADS: 6.0000 | MEDICATED_PAD | Freq: Once | CUTANEOUS | Status: DC

## 2020-05-04 MED ORDER — MENTHOL 3 MG MT LOZG: 1.0000 | LOZENGE | OROMUCOSAL | Status: DC | PRN

## 2020-05-04 MED ORDER — HYDROCODONE-ACETAMINOPHEN 5-325 MG PO TABS: 1.0000 | ORAL_TABLET | ORAL | Status: DC | PRN

## 2020-05-04 MED ORDER — ACETAMINOPHEN 650 MG RE SUPP: 650.0000 mg | RECTAL | Status: DC | PRN

## 2020-05-04 MED ORDER — ALBUTEROL SULFATE (2.5 MG/3ML) 0.083% IN NEBU: 3.0000 mL | INHALATION_SOLUTION | Freq: Four times a day (QID) | RESPIRATORY_TRACT | Status: DC | PRN

## 2020-05-04 MED ORDER — VANCOMYCIN HCL IN DEXTROSE 1-5 GM/200ML-% IV SOLN
1000.0000 mg | INTRAVENOUS | Status: AC
  Administered 2020-05-04: 1000 mg via INTRAVENOUS

## 2020-05-04 MED ORDER — LIDOCAINE 2% (20 MG/ML) 5 ML SYRINGE
INTRAMUSCULAR | Status: AC
  Filled 2020-05-04: qty 5

## 2020-05-04 MED ORDER — BISACODYL 10 MG RE SUPP: 10.0000 mg | Freq: Every day | RECTAL | Status: DC | PRN

## 2020-05-04 MED ORDER — ROCURONIUM BROMIDE 100 MG/10ML IV SOLN
INTRAVENOUS | Status: DC | PRN
  Administered 2020-05-04: 50 mg via INTRAVENOUS
  Administered 2020-05-04 (×2): 10 mg via INTRAVENOUS

## 2020-05-04 MED ORDER — PYRIDOSTIGMINE BROMIDE 60 MG PO TABS
30.0000 mg | ORAL_TABLET | Freq: Every day | ORAL | Status: DC
Start: 2020-05-04 — End: 2020-05-06
  Administered 2020-05-04 – 2020-05-06 (×3): 30 mg via ORAL
  Filled 2020-05-04 (×3): qty 0.5

## 2020-05-04 MED ORDER — PANTOPRAZOLE SODIUM 40 MG PO TBEC
40.0000 mg | DELAYED_RELEASE_TABLET | Freq: Two times a day (BID) | ORAL | Status: DC
  Administered 2020-05-04 – 2020-05-06 (×4): 40 mg via ORAL
  Filled 2020-05-04 (×4): qty 1

## 2020-05-04 MED ORDER — ROCURONIUM BROMIDE 10 MG/ML (PF) SYRINGE
PREFILLED_SYRINGE | INTRAVENOUS | Status: AC
  Filled 2020-05-04: qty 10

## 2020-05-04 MED ORDER — ALPRAZOLAM 0.5 MG PO TABS
0.5000 mg | ORAL_TABLET | Freq: Every day | ORAL | Status: DC
  Administered 2020-05-04 – 2020-05-05 (×2): 0.5 mg via ORAL
  Filled 2020-05-04 (×2): qty 1

## 2020-05-04 MED ORDER — OMEGA-3-ACID ETHYL ESTERS 1 G PO CAPS
1.0000 g | ORAL_CAPSULE | Freq: Two times a day (BID) | ORAL | Status: DC
  Administered 2020-05-05 – 2020-05-06 (×3): 1 g via ORAL
  Filled 2020-05-04 (×3): qty 1

## 2020-05-04 MED ORDER — METHOCARBAMOL 1000 MG/10ML IJ SOLN
500.0000 mg | Freq: Four times a day (QID) | INTRAVENOUS | Status: DC | PRN
  Filled 2020-05-04: qty 5

## 2020-05-04 MED ORDER — DESMOPRESSIN ACETATE 0.2 MG PO TABS
0.3000 mg | ORAL_TABLET | Freq: Every day | ORAL | Status: DC
  Administered 2020-05-04 – 2020-05-05 (×2): 0.3 mg via ORAL
  Filled 2020-05-04 (×2): qty 1

## 2020-05-04 MED ORDER — THROMBIN 5000 UNITS EX SOLR
CUTANEOUS | Status: AC
  Filled 2020-05-04: qty 5000

## 2020-05-04 MED ORDER — ROPINIROLE HCL 1 MG PO TABS: 1.0000 mg | ORAL_TABLET | Freq: Every evening | ORAL | Status: DC | PRN

## 2020-05-04 MED ORDER — HYDROMORPHONE HCL 1 MG/ML IJ SOLN
0.5000 mg | INTRAMUSCULAR | Status: DC | PRN
Start: 2020-05-04 — End: 2020-05-06
  Administered 2020-05-04 – 2020-05-05 (×4): 0.5 mg via INTRAVENOUS
  Filled 2020-05-04 (×4): qty 0.5

## 2020-05-04 MED ORDER — MOMETASONE FURO-FORMOTEROL FUM 100-5 MCG/ACT IN AERO
2.0000 | INHALATION_SPRAY | Freq: Two times a day (BID) | RESPIRATORY_TRACT | Status: DC
  Administered 2020-05-04 – 2020-05-05 (×3): 2 via RESPIRATORY_TRACT
  Filled 2020-05-04: qty 8.8

## 2020-05-04 MED ORDER — ROSUVASTATIN CALCIUM 20 MG PO TABS
40.0000 mg | ORAL_TABLET | Freq: Every day | ORAL | Status: DC
  Administered 2020-05-05 – 2020-05-06 (×2): 40 mg via ORAL
  Filled 2020-05-04 (×2): qty 2

## 2020-05-04 MED ORDER — METHOCARBAMOL 500 MG PO TABS
500.0000 mg | ORAL_TABLET | Freq: Four times a day (QID) | ORAL | Status: DC | PRN
  Administered 2020-05-04 – 2020-05-05 (×4): 500 mg via ORAL
  Filled 2020-05-04 (×5): qty 1

## 2020-05-04 MED ORDER — LACTATED RINGERS IV SOLN: INTRAVENOUS | Status: DC | PRN

## 2020-05-04 MED ORDER — HYDROCODONE-ACETAMINOPHEN 10-325 MG PO TABS
ORAL_TABLET | ORAL | Status: AC
  Filled 2020-05-04: qty 1

## 2020-05-04 MED ORDER — KETOROLAC TROMETHAMINE 15 MG/ML IJ SOLN
15.0000 mg | Freq: Four times a day (QID) | INTRAMUSCULAR | Status: AC
  Administered 2020-05-04 – 2020-05-05 (×4): 15 mg via INTRAVENOUS
  Filled 2020-05-04 (×4): qty 1

## 2020-05-04 MED ORDER — FLEET ENEMA 7-19 GM/118ML RE ENEM: 1.0000 | ENEMA | Freq: Once | RECTAL | Status: DC | PRN

## 2020-05-04 MED ORDER — THROMBIN 5000 UNITS EX SOLR
OROMUCOSAL | Status: DC | PRN
  Administered 2020-05-04: 5 mL via TOPICAL

## 2020-05-04 MED ORDER — VANCOMYCIN HCL 1000 MG/200ML IV SOLN
1000.0000 mg | Freq: Once | INTRAVENOUS | Status: AC
  Administered 2020-05-04: 1000 mg via INTRAVENOUS
  Filled 2020-05-04: qty 200

## 2020-05-04 MED ORDER — LIDOCAINE-EPINEPHRINE 1 %-1:100000 IJ SOLN
INTRAMUSCULAR | Status: DC | PRN
  Administered 2020-05-04: 10 mL

## 2020-05-04 MED ORDER — PHENYLEPHRINE 40 MCG/ML (10ML) SYRINGE FOR IV PUSH (FOR BLOOD PRESSURE SUPPORT)
PREFILLED_SYRINGE | INTRAVENOUS | Status: DC | PRN
  Administered 2020-05-04: 80 ug via INTRAVENOUS

## 2020-05-04 MED ORDER — ORAL CARE MOUTH RINSE: 15.0000 mL | Freq: Once | OROMUCOSAL | Status: AC

## 2020-05-04 MED ORDER — POLYETHYLENE GLYCOL 3350 17 G PO PACK: 17.0000 g | PACK | Freq: Every day | ORAL | Status: DC | PRN

## 2020-05-04 MED ORDER — MESALAMINE 400 MG PO CPDR
800.0000 mg | DELAYED_RELEASE_CAPSULE | Freq: Two times a day (BID) | ORAL | Status: DC
  Administered 2020-05-04 – 2020-05-06 (×4): 800 mg via ORAL
  Filled 2020-05-04 (×5): qty 2

## 2020-05-04 MED ORDER — BUPIVACAINE LIPOSOME 1.3 % IJ SUSP
20.0000 mL | Freq: Once | INTRAMUSCULAR | Status: AC
  Administered 2020-05-04: 20 mL
  Filled 2020-05-04: qty 20

## 2020-05-04 MED ORDER — CYANOCOBALAMIN 1000 MCG/ML IJ SOLN: 1000.0000 ug | INTRAMUSCULAR | Status: DC

## 2020-05-04 MED ORDER — MYCOPHENOLATE MOFETIL 250 MG PO CAPS
1000.0000 mg | ORAL_CAPSULE | Freq: Two times a day (BID) | ORAL | Status: DC
  Administered 2020-05-05 – 2020-05-06 (×3): 1000 mg via ORAL
  Filled 2020-05-04 (×4): qty 4

## 2020-05-04 MED ORDER — CHLORHEXIDINE GLUCONATE 0.12 % MT SOLN
15.0000 mL | Freq: Once | OROMUCOSAL | Status: AC
  Administered 2020-05-04: 15 mL via OROMUCOSAL

## 2020-05-04 MED ORDER — ALBUMIN HUMAN 5 % IV SOLN: INTRAVENOUS | Status: DC | PRN

## 2020-05-04 MED ORDER — ACETAMINOPHEN 325 MG PO TABS: 650.0000 mg | ORAL_TABLET | ORAL | Status: DC | PRN

## 2020-05-04 MED ORDER — HYDROCODONE-ACETAMINOPHEN 10-325 MG PO TABS: 2.0000 | ORAL_TABLET | ORAL | Status: DC | PRN

## 2020-05-04 MED ORDER — SODIUM CHLORIDE 0.9 % IV SOLN: 250.0000 mL | INTRAVENOUS | Status: DC

## 2020-05-04 MED ORDER — TRIAMTERENE-HCTZ 37.5-25 MG PO TABS
1.0000 | ORAL_TABLET | Freq: Every day | ORAL | Status: DC
  Administered 2020-05-05 – 2020-05-06 (×2): 1 via ORAL
  Filled 2020-05-04 (×3): qty 1

## 2020-05-04 MED ORDER — SODIUM CHLORIDE 0.9% FLUSH: 3.0000 mL | INTRAVENOUS | Status: DC | PRN

## 2020-05-04 MED ORDER — ONDANSETRON HCL 4 MG/2ML IJ SOLN
4.0000 mg | Freq: Four times a day (QID) | INTRAMUSCULAR | Status: DC | PRN
  Filled 2020-05-04: qty 2

## 2020-05-04 MED ORDER — ONDANSETRON HCL 4 MG PO TABS: 4.0000 mg | ORAL_TABLET | Freq: Three times a day (TID) | ORAL | Status: DC | PRN

## 2020-05-04 MED ORDER — ONDANSETRON HCL 4 MG PO TABS
4.0000 mg | ORAL_TABLET | Freq: Four times a day (QID) | ORAL | Status: DC | PRN
  Administered 2020-05-06: 4 mg via ORAL
  Filled 2020-05-04: qty 1

## 2020-05-04 MED ORDER — PANTOPRAZOLE SODIUM 40 MG IV SOLR: 40.0000 mg | Freq: Every day | INTRAVENOUS | Status: DC

## 2020-05-04 MED ORDER — PROGESTERONE MICRONIZED 100 MG PO CAPS
100.0000 mg | ORAL_CAPSULE | Freq: Every day | ORAL | Status: DC
  Administered 2020-05-04 – 2020-05-05 (×2): 100 mg via ORAL
  Filled 2020-05-04 (×2): qty 1

## 2020-05-04 MED ORDER — PROPOFOL 10 MG/ML IV BOLUS
INTRAVENOUS | Status: AC
  Filled 2020-05-04: qty 20

## 2020-05-04 MED ORDER — METHOCARBAMOL 500 MG PO TABS
ORAL_TABLET | ORAL | Status: AC
  Filled 2020-05-04: qty 1

## 2020-05-04 MED ORDER — ESTRADIOL 1 MG PO TABS
4.0000 mg | ORAL_TABLET | Freq: Every day | ORAL | Status: DC
  Administered 2020-05-04 – 2020-05-05 (×2): 4 mg via ORAL
  Filled 2020-05-04: qty 4
  Filled 2020-05-04: qty 2
  Filled 2020-05-04: qty 4

## 2020-05-04 MED ORDER — LACTATED RINGERS IV SOLN: INTRAVENOUS | Status: DC

## 2020-05-04 MED ORDER — PROPOFOL 10 MG/ML IV BOLUS
INTRAVENOUS | Status: DC | PRN
  Administered 2020-05-04: 150 mg via INTRAVENOUS
  Administered 2020-05-04: 3 mg via INTRAVENOUS

## 2020-05-04 MED ORDER — FENTANYL CITRATE (PF) 250 MCG/5ML IJ SOLN
INTRAMUSCULAR | Status: AC
  Filled 2020-05-04: qty 5

## 2020-05-04 MED ORDER — 0.9 % SODIUM CHLORIDE (POUR BTL) OPTIME
TOPICAL | Status: DC | PRN
  Administered 2020-05-04: 1000 mL

## 2020-05-04 MED ORDER — PHENYLEPHRINE 40 MCG/ML (10ML) SYRINGE FOR IV PUSH (FOR BLOOD PRESSURE SUPPORT)
PREFILLED_SYRINGE | INTRAVENOUS | Status: AC
  Filled 2020-05-04: qty 10

## 2020-05-04 MED ORDER — ONDANSETRON HCL 4 MG/2ML IJ SOLN: 4.0000 mg | Freq: Once | INTRAMUSCULAR | Status: DC | PRN

## 2020-05-04 MED ORDER — DOCUSATE SODIUM 100 MG PO CAPS
100.0000 mg | ORAL_CAPSULE | Freq: Two times a day (BID) | ORAL | Status: DC
  Administered 2020-05-04 – 2020-05-06 (×5): 100 mg via ORAL
  Filled 2020-05-04 (×5): qty 1

## 2020-05-04 MED ORDER — KCL IN DEXTROSE-NACL 20-5-0.45 MEQ/L-%-% IV SOLN: INTRAVENOUS | Status: DC

## 2020-05-04 MED ORDER — FENTANYL CITRATE (PF) 100 MCG/2ML IJ SOLN
25.0000 ug | INTRAMUSCULAR | Status: DC | PRN
  Administered 2020-05-04 (×4): 50 ug via INTRAVENOUS

## 2020-05-04 MED ORDER — HYDROCODONE-ACETAMINOPHEN 10-325 MG PO TABS
1.0000 | ORAL_TABLET | Freq: Every day | ORAL | Status: DC | PRN
  Administered 2020-05-04: 1 via ORAL

## 2020-05-04 MED ORDER — ZOLPIDEM TARTRATE 5 MG PO TABS: 5.0000 mg | ORAL_TABLET | Freq: Every evening | ORAL | Status: DC | PRN

## 2020-05-04 MED ORDER — SODIUM CHLORIDE 0.9% FLUSH
3.0000 mL | Freq: Two times a day (BID) | INTRAVENOUS | Status: DC
  Administered 2020-05-04 – 2020-05-05 (×2): 3 mL via INTRAVENOUS

## 2020-05-04 MED ORDER — BUPIVACAINE HCL (PF) 0.5 % IJ SOLN
INTRAMUSCULAR | Status: AC
  Filled 2020-05-04: qty 30

## 2020-05-04 MED ORDER — DEXAMETHASONE SODIUM PHOSPHATE 10 MG/ML IJ SOLN
INTRAMUSCULAR | Status: DC | PRN
  Administered 2020-05-04: 10 mg via INTRAVENOUS

## 2020-05-04 MED ORDER — LOSARTAN POTASSIUM 50 MG PO TABS
50.0000 mg | ORAL_TABLET | Freq: Every day | ORAL | Status: DC
  Administered 2020-05-04 – 2020-05-06 (×3): 50 mg via ORAL
  Filled 2020-05-04 (×3): qty 1

## 2020-05-04 MED ORDER — LIDOCAINE-EPINEPHRINE 1 %-1:100000 IJ SOLN
INTRAMUSCULAR | Status: AC
  Filled 2020-05-04: qty 1

## 2020-05-04 MED ORDER — TRIAMCINOLONE ACETONIDE 0.1 % EX CREA
1.0000 "application " | TOPICAL_CREAM | Freq: Two times a day (BID) | CUTANEOUS | Status: DC
  Administered 2020-05-04 – 2020-05-05 (×2): 1 via TOPICAL
  Filled 2020-05-04: qty 15

## 2020-05-04 MED ORDER — POTASSIUM CHLORIDE CRYS ER 20 MEQ PO TBCR
20.0000 meq | EXTENDED_RELEASE_TABLET | Freq: Two times a day (BID) | ORAL | Status: DC
  Administered 2020-05-04 – 2020-05-06 (×5): 20 meq via ORAL
  Filled 2020-05-04 (×5): qty 1

## 2020-05-04 MED ORDER — FENTANYL CITRATE (PF) 250 MCG/5ML IJ SOLN
INTRAMUSCULAR | Status: DC | PRN
  Administered 2020-05-04 (×7): 50 ug via INTRAVENOUS

## 2020-05-04 MED ORDER — BUPIVACAINE HCL (PF) 0.5 % IJ SOLN
INTRAMUSCULAR | Status: DC | PRN
  Administered 2020-05-04: 10 mL

## 2020-05-04 MED ORDER — ONDANSETRON HCL 4 MG/2ML IJ SOLN
INTRAMUSCULAR | Status: DC | PRN
  Administered 2020-05-04: 4 mg via INTRAVENOUS

## 2020-05-04 MED ORDER — ACETAMINOPHEN 10 MG/ML IV SOLN: 1000.0000 mg | Freq: Once | INTRAVENOUS | Status: DC | PRN

## 2020-05-04 MED ORDER — HYDROCODONE-ACETAMINOPHEN 10-325 MG PO TABS
1.0000 | ORAL_TABLET | ORAL | Status: DC | PRN
  Administered 2020-05-04 – 2020-05-06 (×11): 2 via ORAL
  Filled 2020-05-04 (×11): qty 2

## 2020-05-04 MED ORDER — ROPINIROLE HCL 1 MG PO TABS
1.0000 mg | ORAL_TABLET | Freq: Every day | ORAL | Status: DC
  Administered 2020-05-04 – 2020-05-05 (×2): 1 mg via ORAL
  Filled 2020-05-04 (×2): qty 1

## 2020-05-04 MED ORDER — PHENOL 1.4 % MT LIQD: 1.0000 | OROMUCOSAL | Status: DC | PRN

## 2020-05-04 MED ORDER — HYDROXYZINE HCL 50 MG/ML IM SOLN
50.0000 mg | Freq: Four times a day (QID) | INTRAMUSCULAR | Status: DC | PRN
  Administered 2020-05-04: 50 mg via INTRAMUSCULAR
  Filled 2020-05-04: qty 1

## 2020-05-04 MED ORDER — VITAMIN D (ERGOCALCIFEROL) 1.25 MG (50000 UNIT) PO CAPS: 50000.0000 [IU] | ORAL_CAPSULE | ORAL | Status: DC

## 2020-05-04 MED ORDER — TRIAMTERENE-HCTZ 37.5-25 MG PO CAPS
1.0000 | ORAL_CAPSULE | Freq: Every day | ORAL | Status: DC
  Filled 2020-05-04: qty 1

## 2020-05-04 MED ORDER — ALBUTEROL SULFATE (2.5 MG/3ML) 0.083% IN NEBU: 2.5000 mg | INHALATION_SOLUTION | Freq: Four times a day (QID) | RESPIRATORY_TRACT | Status: DC | PRN

## 2020-05-04 SURGICAL SUPPLY — 93 items
BAND RUBBER #18 3X1/16 STRL (MISCELLANEOUS) ×4 IMPLANT
BASKET BONE COLLECTION (BASKET) ×2 IMPLANT
BLADE CLIPPER SURG (BLADE) IMPLANT
BONE CANC CHIPS 20CC PCAN1/4 (Bone Implant) ×2 IMPLANT
BUR MATCHSTICK NEURO 3.0 LAGG (BURR) ×3 IMPLANT
BUR PRECISION FLUTE 5.0 (BURR) ×1 IMPLANT
BUR ROUND FLUTED 5 RND (BURR) ×2 IMPLANT
CANISTER SUCT 3000ML PPV (MISCELLANEOUS) ×3 IMPLANT
CARTRIDGE OIL MAESTRO DRILL (MISCELLANEOUS) ×2 IMPLANT
CATH FOLEY LATEX FREE 16FR (CATHETERS) ×2
CATH FOLEY LF 16FR (CATHETERS) IMPLANT
CHIPS CANC BONE 20CC PCAN1/4 (Bone Implant) ×1 IMPLANT
CNTNR URN SCR LID CUP LEK RST (MISCELLANEOUS) ×1 IMPLANT
CONT SPEC 4OZ STRL OR WHT (MISCELLANEOUS) ×2
COVER BACK TABLE 24X17X13 BIG (DRAPES) ×1 IMPLANT
COVER BACK TABLE 60X90IN (DRAPES) ×1 IMPLANT
COVER WAND RF STERILE (DRAPES) ×2 IMPLANT
DECANTER SPIKE VIAL GLASS SM (MISCELLANEOUS) ×3 IMPLANT
DERMABOND ADVANCED (GAUZE/BANDAGES/DRESSINGS) ×1
DERMABOND ADVANCED .7 DNX12 (GAUZE/BANDAGES/DRESSINGS) ×2 IMPLANT
DIFFUSER DRILL AIR PNEUMATIC (MISCELLANEOUS) ×3 IMPLANT
DRAPE C-ARM 42X72 X-RAY (DRAPES) ×2 IMPLANT
DRAPE C-ARMOR (DRAPES) ×2 IMPLANT
DRAPE LAPAROTOMY 100X72X124 (DRAPES) ×3 IMPLANT
DRAPE MICROSCOPE LEICA (MISCELLANEOUS) ×2 IMPLANT
DRAPE SURG 17X23 STRL (DRAPES) ×3 IMPLANT
DRSG OPSITE POSTOP 4X6 (GAUZE/BANDAGES/DRESSINGS) ×1 IMPLANT
DURAPREP 26ML APPLICATOR (WOUND CARE) ×3 IMPLANT
ELECT REM PT RETURN 9FT ADLT (ELECTROSURGICAL) ×2
ELECTRODE REM PT RTRN 9FT ADLT (ELECTROSURGICAL) ×2 IMPLANT
GAUZE 4X4 16PLY RFD (DISPOSABLE) IMPLANT
GAUZE SPONGE 4X4 12PLY STRL (GAUZE/BANDAGES/DRESSINGS) ×1 IMPLANT
GLOVE BIO SURGEON STRL SZ8 (GLOVE) ×3 IMPLANT
GLOVE BIOGEL PI IND STRL 8.5 (GLOVE) ×3 IMPLANT
GLOVE BIOGEL PI INDICATOR 8.5 (GLOVE) ×3
GLOVE ECLIPSE 8.0 STRL XLNG CF (GLOVE) ×3 IMPLANT
GLOVE EXAM NITRILE XL STR (GLOVE) IMPLANT
GLOVE SRG 8 PF TXTR STRL LF DI (GLOVE) ×3 IMPLANT
GLOVE SURG POLYISO LF SZ6.5 (GLOVE) ×1 IMPLANT
GLOVE SURG POLYISO LF SZ8 (GLOVE) ×5 IMPLANT
GLOVE SURG SS PI 7.5 STRL IVOR (GLOVE) ×1 IMPLANT
GLOVE SURG UNDER POLY LF SZ6.5 (GLOVE) ×2 IMPLANT
GLOVE SURG UNDER POLY LF SZ7 (GLOVE) ×2 IMPLANT
GLOVE SURG UNDER POLY LF SZ7.5 (GLOVE) ×1 IMPLANT
GLOVE SURG UNDER POLY LF SZ8 (GLOVE)
GOWN STRL REUS W/ TWL LRG LVL3 (GOWN DISPOSABLE) IMPLANT
GOWN STRL REUS W/ TWL XL LVL3 (GOWN DISPOSABLE) ×3 IMPLANT
GOWN STRL REUS W/TWL 2XL LVL3 (GOWN DISPOSABLE) ×5 IMPLANT
GOWN STRL REUS W/TWL LRG LVL3 (GOWN DISPOSABLE) ×2
GOWN STRL REUS W/TWL XL LVL3 (GOWN DISPOSABLE) ×8
GRAFT BNE CANC CHIPS 1-8 20CC (Bone Implant) IMPLANT
HEMOSTAT POWDER KIT SURGIFOAM (HEMOSTASIS) ×3 IMPLANT
IMPL TLX20 11X11X26 20D (Cage) IMPLANT
KIT BASIN OR (CUSTOM PROCEDURE TRAY) ×3 IMPLANT
KIT INFUSE X SMALL 1.4CC (Orthopedic Implant) ×1 IMPLANT
KIT POSITION SURG JACKSON T1 (MISCELLANEOUS) ×2 IMPLANT
KIT TURNOVER KIT B (KITS) ×3 IMPLANT
MILL MEDIUM DISP (BLADE) ×1 IMPLANT
NDL HYPO 18GX1.5 BLUNT FILL (NEEDLE) IMPLANT
NDL HYPO 21X1.5 SAFETY (NEEDLE) IMPLANT
NDL HYPO 25X1 1.5 SAFETY (NEEDLE) ×2 IMPLANT
NDL SPNL 18GX3.5 QUINCKE PK (NEEDLE) ×1 IMPLANT
NEEDLE HYPO 18GX1.5 BLUNT FILL (NEEDLE) IMPLANT
NEEDLE HYPO 21X1.5 SAFETY (NEEDLE) ×2 IMPLANT
NEEDLE HYPO 25X1 1.5 SAFETY (NEEDLE) ×2 IMPLANT
NEEDLE SPNL 18GX3.5 QUINCKE PK (NEEDLE) IMPLANT
NS IRRIG 1000ML POUR BTL (IV SOLUTION) ×3 IMPLANT
OIL CARTRIDGE MAESTRO DRILL (MISCELLANEOUS) ×2
PACK LAMINECTOMY NEURO (CUSTOM PROCEDURE TRAY) ×3 IMPLANT
PAD ARMBOARD 7.5X6 YLW CONV (MISCELLANEOUS) ×9 IMPLANT
PATTIES SURGICAL .5 X.5 (GAUZE/BANDAGES/DRESSINGS) IMPLANT
PATTIES SURGICAL .5 X1 (DISPOSABLE) IMPLANT
PATTIES SURGICAL 1X1 (DISPOSABLE) IMPLANT
ROD RELINE-O LORD 5.5X40 (Rod) ×2 IMPLANT
SCREW LOCK RELINE 5.5 TULIP (Screw) ×4 IMPLANT
SCREW RELINE-O POLY 6.5X40 (Screw) ×4 IMPLANT
SPONGE LAP 4X18 RFD (DISPOSABLE) IMPLANT
SPONGE SURGIFOAM ABS GEL SZ50 (HEMOSTASIS) ×1 IMPLANT
STAPLER SKIN PROX WIDE 3.9 (STAPLE) IMPLANT
SUT VIC AB 0 CT1 18XCR BRD8 (SUTURE) ×1 IMPLANT
SUT VIC AB 0 CT1 8-18 (SUTURE)
SUT VIC AB 1 CT1 18XBRD ANBCTR (SUTURE) ×2 IMPLANT
SUT VIC AB 1 CT1 8-18 (SUTURE) ×2
SUT VIC AB 2-0 CT1 18 (SUTURE) ×4 IMPLANT
SUT VIC AB 3-0 SH 8-18 (SUTURE) ×5 IMPLANT
SYR 20ML ECCENTRIC (SYRINGE) ×1 IMPLANT
SYR 3ML LL SCALE MARK (SYRINGE) ×2 IMPLANT
SYR 5ML LL (SYRINGE) IMPLANT
TLX20 IMPLANT 11X11X26 20D (Cage) ×2 IMPLANT
TOWEL GREEN STERILE (TOWEL DISPOSABLE) ×3 IMPLANT
TOWEL GREEN STERILE FF (TOWEL DISPOSABLE) ×3 IMPLANT
TRAY FOLEY MTR SLVR 16FR STAT (SET/KITS/TRAYS/PACK) ×1 IMPLANT
WATER STERILE IRR 1000ML POUR (IV SOLUTION) ×3 IMPLANT

## 2020-05-04 NOTE — Transfer of Care (Signed)
Immediate Anesthesia Transfer of Care Note  Patient: Teresa Lee  Procedure(s) Performed: Right Lumbar Four-Five Transforaminal lumbar interbody fusion (Right Spine Lumbar) Right Lumbar Five Sacral One Microdiscectomy (Right Spine Lumbar)  Patient Location: PACU  Anesthesia Type:General  Level of Consciousness: drowsy and patient cooperative  Airway & Oxygen Therapy: Patient Spontanous Breathing and Patient connected to face mask oxygen  Post-op Assessment: Report given to RN and Post -op Vital signs reviewed and stable  Post vital signs: Reviewed  Last Vitals:  Vitals Value Taken Time  BP 174/106 05/04/20 1045  Temp    Pulse 100 05/04/20 1047  Resp 10 05/04/20 1047  SpO2 100 % 05/04/20 1047  Vitals shown include unvalidated device data.  Last Pain:  Vitals:   05/04/20 0701  TempSrc:   PainSc: 8       Patients Stated Pain Goal: 2 (27/06/23 7628)  Complications: No complications documented.

## 2020-05-04 NOTE — Progress Notes (Signed)
Awake, alert, conversant.  MAEW with good strength.  Patient is doing well.

## 2020-05-04 NOTE — Evaluation (Signed)
Physical Therapy Evaluation Patient Details Name: Teresa Lee MRN: [id number] DOB: 05-25-1960 Today's Date: 05/04/2020   History of Present Illness  Teresa Lee is a 60 year old female with complaints of low back pain and right lower extremity pain who underwent R lumber 4-5 PLIF, R lumbar 5-sacral microdiscectomy. PMH: crohns disease    Clinical Impression  Pt admitted with above. Pt with difficulty adhering to back precautions but tolerated mobility well. Needs assist to don back brace. Pt to benefit from RW for optimal support and energy conservation to promote increased ambulation tolerance. Acute PT to cont to follow.    Follow Up Recommendations No PT follow up;Supervision - Intermittent    Equipment Recommendations  Rolling walker with 5" wheels    Recommendations for Other Services       Precautions / Restrictions Precautions Precautions: Back Precaution Booklet Issued: Yes (comment) Precaution Comments: pt with verbal understanding but requires freq v/c's to adhere functionally Required Braces or Orthoses: Spinal Brace Spinal Brace: Lumbar corset Restrictions Weight Bearing Restrictions: No      Mobility  Bed Mobility Overal bed mobility: Needs Assistance Bed Mobility: Rolling;Sidelying to Sit;Sit to Sidelying Rolling: Min guard Sidelying to sit: Min guard     Sit to sidelying: Min guard General bed mobility comments: max verbal cues for log roll technique as pt freq twisting and bridging    Transfers Overall transfer level: Needs assistance Equipment used: None Transfers: Sit to/from Stand Sit to Stand: Min guard         General transfer comment: increased time  Ambulation/Gait Ambulation/Gait assistance: Min guard Gait Distance (Feet): 120 Feet Assistive device: IV Pole Gait Pattern/deviations: Step-through pattern;Decreased stride length Gait velocity: dec Gait velocity interpretation: <1.31 ft/sec, indicative of household  ambulator General Gait Details: pt initially antalgic with short step length but improved with distance, increased capability to stand up straight and increased step length  Stairs            Wheelchair Mobility    Modified Rankin (Stroke Patients Only)       Balance Overall balance assessment: Mild deficits observed, not formally tested                                           Pertinent Vitals/Pain Pain Assessment: 0-10 Pain Score: 6  Pain Location: back Pain Descriptors / Indicators: Sore    Home Living Family/patient expects to be discharged to:: Private residence Living Arrangements: Spouse/significant other Available Help at Discharge: Family Type of Home: House Home Access: Stairs to enter Entrance Stairs-Rails: Can reach both Entrance Stairs-Number of Steps: 3 Home Layout: Able to live on main level with bedroom/bathroom;Two level        Prior Function Level of Independence: Independent         Comments: uses cane occasionally     Hand Dominance   Dominant Hand: Right    Extremity/Trunk Assessment   Upper Extremity Assessment Upper Extremity Assessment: Overall WFL for tasks assessed    Lower Extremity Assessment Lower Extremity Assessment: Generalized weakness    Cervical / Trunk Assessment Cervical / Trunk Assessment: Other exceptions Cervical / Trunk Exceptions: back surbery  Communication   Communication: No difficulties  Cognition Arousal/Alertness: Awake/alert Behavior During Therapy: WFL for tasks assessed/performed Overall Cognitive Status: Within Functional Limits for tasks assessed  General Comments General comments (skin integrity, edema, etc.): pt assisted to bathroom, supervision for hygiene, able to adhere to precautions    Exercises     Assessment/Plan    PT Assessment Patient needs continued PT services  PT Problem List Decreased  strength;Decreased balance;Decreased activity tolerance;Decreased range of motion;Decreased coordination;Decreased cognition;Decreased knowledge of use of DME;Decreased mobility       PT Treatment Interventions DME instruction;Gait training;Stair training;Functional mobility training;Therapeutic activities;Therapeutic exercise;Balance training    PT Goals (Current goals can be found in the Care Plan section)  Acute Rehab PT Goals Patient Stated Goal: home PT Goal Formulation: With patient Potential to Achieve Goals: Good    Frequency Min 5X/week   Barriers to discharge        Co-evaluation               AM-PAC PT "6 Clicks" Mobility  Outcome Measure Help needed turning from your back to your side while in a flat bed without using bedrails?: A Little Help needed moving from lying on your back to sitting on the side of a flat bed without using bedrails?: A Little Help needed moving to and from a bed to a chair (including a wheelchair)?: A Little Help needed standing up from a chair using your arms (e.g., wheelchair or bedside chair)?: A Little Help needed to walk in hospital room?: A Little Help needed climbing 3-5 steps with a railing? : A Little 6 Click Score: 18    End of Session Equipment Utilized During Treatment: Gait belt Activity Tolerance: Patient tolerated treatment well Patient left: in bed;with call bell/phone within reach;with family/visitor present Nurse Communication: Mobility status PT Visit Diagnosis: Muscle weakness (generalized) (M62.81);Difficulty in walking, not elsewhere classified (R26.2)    Time: 0347-4259 PT Time Calculation (min) (ACUTE ONLY): 24 min   Charges:   PT Evaluation $PT Eval Moderate Complexity: 1 Mod PT Treatments $Gait Training: 8-22 mins        Kittie Plater, PT, DPT Acute Rehabilitation Services Pager #: [phone] Office #: [phone]   Berline Lopes 05/04/2020, 3:13 PM

## 2020-05-04 NOTE — Anesthesia Procedure Notes (Signed)
Procedure Name: Intubation Date/Time: 05/04/2020 7:48 AM Performed by: Janene Harvey, CRNA Pre-anesthesia Checklist: Patient identified, Emergency Drugs available, Suction available and Patient being monitored Patient Re-evaluated:Patient Re-evaluated prior to induction Oxygen Delivery Method: Circle system utilized Preoxygenation: Pre-oxygenation with 100% oxygen Induction Type: IV induction Ventilation: Mask ventilation without difficulty Laryngoscope Size: Mac and 4 Grade View: Grade III Tube type: Oral Tube size: 7.0 mm Number of attempts: 1 Airway Equipment and Method: Stylet and Oral airway Placement Confirmation: ETT inserted through vocal cords under direct vision,  positive ETCO2 and breath sounds checked- equal and bilateral Secured at: 22 cm Tube secured with: Tape Dental Injury: Teeth and Oropharynx as per pre-operative assessment

## 2020-05-04 NOTE — Anesthesia Postprocedure Evaluation (Signed)
Anesthesia Post Note  Patient: Teresa Lee  Procedure(s) Performed: Right Lumbar Four-Five Transforaminal lumbar interbody fusion (Right Spine Lumbar) Right Lumbar Five Sacral One Microdiscectomy (Right Spine Lumbar)     Patient location during evaluation: PACU Anesthesia Type: General Level of consciousness: awake and alert Pain management: pain level controlled Vital Signs Assessment: post-procedure vital signs reviewed and stable Respiratory status: spontaneous breathing, nonlabored ventilation, respiratory function stable and patient connected to nasal cannula oxygen Cardiovascular status: blood pressure returned to baseline and stable Postop Assessment: no apparent nausea or vomiting Anesthetic complications: no   No complications documented.  Last Vitals:  Vitals:   05/04/20 1155 05/04/20 1225  BP: (!) 147/84 (!) 147/71  Pulse: 88 91  Resp: 17 18  Temp:  36.5 C  SpO2: 98% 96%    Last Pain:  Vitals:   05/04/20 1225  TempSrc: Oral  PainSc:                  March Rummage Mercede Rollo

## 2020-05-04 NOTE — Op Note (Signed)
05/04/2020  10:42 AM  PATIENT:  Teresa Lee  60 y.o. female  PRE-OPERATIVE DIAGNOSIS:  Spondylolisthesis of lumbar region Herniated nucleus pulposus, Lumbar, lumbago, lumbar radiculopathy  POST-OPERATIVE DIAGNOSIS:   Spondylolisthesis of lumbar region Herniated nucleus pulposus, Lumbar, lumbago, lumbar radiculopathy   PROCEDURE:  Procedure(s) with comments: Right Lumbar Four-Five Transforaminal lumbar interbody fusion (Right) - posterior Right Lumbar Five Sacral One Microdiscectomy (Right) - posterior with pedicle screw fixation and posterolateral arthrodesis  SURGEON:  Surgeon(s) and Role:    Erline Levine, MD - Primary    * Vallarie Mare, MD - Assisting  PHYSICIAN ASSISTANT: Glenford Peers, NP  ASSISTANTS: Poteat, RN   ANESTHESIA:   general  EBL:  50 mL   BLOOD ADMINISTERED:none  DRAINS: none   LOCAL MEDICATIONS USED:  MARCAINE    and LIDOCAINE   SPECIMEN:  No Specimen  DISPOSITION OF SPECIMEN:  N/A  COUNTS:  YES  TOURNIQUET:  * No tourniquets in log *  DICTATION: Patient is 60 year old woman with mobile spondylolisthesis of L4 on L5 with lumbar stenosis. She has a right L5 radiculopathy. She awlso has a herniated lumbar disc at L 5 S 1 on the right.  It was elected to take her to surgery for decompression and fusion at L 45 and microdiscectomy at L 5 S 1 on the right.   Procedure: Patient was placed in a prone position on the [place] table after smooth and uncomplicated induction of general endotracheal anesthesia. Her low back was prepped and draped in usual sterile fashion with betadine scrub and DuraPrep after marking relevant lumbar anatomy with C arm. Area of incision was infiltrated with local lidocaine. Incision was made to the lumbodorsal fascia was incised and exposure was performed of the L4 through L5 spinous processes laminae facet joint and transverse processes along with the right L 5 S 1 interlaminar space. Intraoperative x-ray was obtained which  confirmed correct orientation. A laminotomy of L 5 S 1 was created on the right and using operating microscope, the thecal sac and S 1 nerve roots were decompressed, exposing a subligamentous disc herniation.  The interspace was incised and herniated disc material was removed along with loose disc material both medial and lateral in the interspace.  All neural elements were felt to be well decompressed at this point and hemostasis was obtained.  A total right hemi-laminectomy of L4 was performed with disarticulation of the facet joints at this level and thorough decompression was performed of both L4 and L5 nerve roots along with the common dural tube. This decompression was more involved than would be typical of that performed for TLIF alone and included painstaking dissection of adherent ligament compressing the thecal sac and wide decompression of all neural elements. A thorough discectomy was initially performed on the right with preparation of the endplates for grafting a trial spacer was placed this level and a thorough discectomy was performed on the right as well. Bone autograft was packed within the interspace on the right along with extra small BMP kit.  6 cc of morselized bone autograft was packed in the interspace followed by 11 x 11 x 25 mm titanium cage opened 20 degrees. The posterolateral region was extensively decorticated and pedicle probes were placed at L4 and L5 bilaterally. Intraoperative fluoroscopy confirmed correct orientationin the AP and lateral plane. 40 x 6.5 mm pedicle screws were placed at L5 bilaterally and 40 x 6.5 mm screws placed at L4 bilaterally final x-rays demonstrated well-positioned interbody grafts and pedicle  screw fixation. A 40 mm lordotic rod was placed on the right and a 40 mm rod was placed on the left locked down in situ and the posterolateral region and decorticated left facet joint was packed with the remaining BMP and bone autograft and allograft chips. Long-acting  Marcaine was injected into the deep musculature.  Fascia was closed with 1 Vicryl sutures skin edges were reapproximated 2 and 3-0 Vicryl sutures. The wound was dressed with Dermabond and an occlusive dressing. the patient was extubated in the operating room and taken to recovery in stable satisfactory condition. She tolerated the operation well counts were correct at the end of the case.   PLAN OF CARE: Admit to inpatient   PATIENT DISPOSITION:  PACU - hemodynamically stable.   Delay start of Pharmacological VTE agent (>24hrs) due to surgical blood loss or risk of bleeding: yes

## 2020-05-04 NOTE — Brief Op Note (Signed)
05/04/2020  10:42 AM  PATIENT:  Teresa Lee  60 y.o. female  PRE-OPERATIVE DIAGNOSIS:  Spondylolisthesis of lumbar region Herniated nucleus pulposus, Lumbar, lumbago, lumbar radiculopathy  POST-OPERATIVE DIAGNOSIS:   Spondylolisthesis of lumbar region Herniated nucleus pulposus, Lumbar, lumbago, lumbar radiculopathy   PROCEDURE:  Procedure(s) with comments: Right Lumbar Four-Five Transforaminal lumbar interbody fusion (Right) - posterior Right Lumbar Five Sacral One Microdiscectomy (Right) - posterior with pedicle screw fixation and posterolateral arthrodesis  SURGEON:  Surgeon(s) and Role:    Erline Levine, MD - Primary    * Vallarie Mare, MD - Assisting  PHYSICIAN ASSISTANT: Glenford Peers, NP  ASSISTANTS: Poteat, RN   ANESTHESIA:   general  EBL:  50 mL   BLOOD ADMINISTERED:none  DRAINS: none   LOCAL MEDICATIONS USED:  MARCAINE    and LIDOCAINE   SPECIMEN:  No Specimen  DISPOSITION OF SPECIMEN:  N/A  COUNTS:  YES  TOURNIQUET:  * No tourniquets in log *  DICTATION: Patient is 60 year old woman with mobile spondylolisthesis of L4 on L5 with lumbar stenosis. She has a right L5 radiculopathy. She awlso has a herniated lumbar disc at L 5 S 1 on the right.  It was elected to take her to surgery for decompression and fusion at L 45 and microdiscectomy at L 5 S 1 on the right.   Procedure: Patient was placed in a prone position on the [place] table after smooth and uncomplicated induction of general endotracheal anesthesia. Her low back was prepped and draped in usual sterile fashion with betadine scrub and DuraPrep after marking relevant lumbar anatomy with C arm. Area of incision was infiltrated with local lidocaine. Incision was made to the lumbodorsal fascia was incised and exposure was performed of the L4 through L5 spinous processes laminae facet joint and transverse processes along with the right L 5 S 1 interlaminar space. Intraoperative x-ray was obtained which  confirmed correct orientation. A laminotomy of L 5 S 1 was created on the right and using operating microscope, the thecal sac and S 1 nerve roots were decompressed, exposing a subligamentous disc herniation.  The interspace was incised and herniated disc material was removed along with loose disc material both medial and lateral in the interspace.  All neural elements were felt to be well decompressed at this point and hemostasis was obtained.  A total right hemi-laminectomy of L4 was performed with disarticulation of the facet joints at this level and thorough decompression was performed of both L4 and L5 nerve roots along with the common dural tube. This decompression was more involved than would be typical of that performed for TLIF alone and included painstaking dissection of adherent ligament compressing the thecal sac and wide decompression of all neural elements. A thorough discectomy was initially performed on the right with preparation of the endplates for grafting a trial spacer was placed this level and a thorough discectomy was performed on the right as well. Bone autograft was packed within the interspace on the right along with extra small BMP kit.  6 cc of morselized bone autograft was packed in the interspace followed by 11 x 11 x 25 mm titanium cage opened 20 degrees. The posterolateral region was extensively decorticated and pedicle probes were placed at L4 and L5 bilaterally. Intraoperative fluoroscopy confirmed correct orientationin the AP and lateral plane. 40 x 6.5 mm pedicle screws were placed at L5 bilaterally and 40 x 6.5 mm screws placed at L4 bilaterally final x-rays demonstrated well-positioned interbody grafts and pedicle  screw fixation. A 40 mm lordotic rod was placed on the right and a 40 mm rod was placed on the left locked down in situ and the posterolateral region and decorticated left facet joint was packed with the remaining BMP and bone autograft and allograft chips. Long-acting  Marcaine was injected into the deep musculature.  Fascia was closed with 1 Vicryl sutures skin edges were reapproximated 2 and 3-0 Vicryl sutures. The wound was dressed with Dermabond and an occlusive dressing. the patient was extubated in the operating room and taken to recovery in stable satisfactory condition. She tolerated the operation well counts were correct at the end of the case.   PLAN OF CARE: Admit to inpatient   PATIENT DISPOSITION:  PACU - hemodynamically stable.   Delay start of Pharmacological VTE agent (>24hrs) due to surgical blood loss or risk of bleeding: yes

## 2020-05-04 NOTE — Interval H&P Note (Signed)
History and Physical Interval Note:  05/04/2020 7:30 AM  Teresa Lee  has presented today for surgery, with the diagnosis of Spondylolisthesis of lumbar region Herniated nucleus pulposus, Lumbar.  The various methods of treatment have been discussed with the patient and family. After consideration of risks, benefits and other options for treatment, the patient has consented to  Procedure(s) with comments: Right Lumbar 4-5 Transforaminal lumbar interbody fusion (Right) Right Lumbar 5 Sacral 1 Microdiscectomy (Right) - 3C/RM 21 as a surgical intervention.  The patient's history has been reviewed, patient examined, no change in status, stable for surgery.  I have reviewed the patient's chart and labs.  Questions were answered to the patient's satisfaction.     Peggyann Shoals

## 2020-05-05 ENCOUNTER — Encounter (HOSPITAL_COMMUNITY): Payer: Self-pay | Admitting: Neurosurgery

## 2020-05-05 MED ORDER — DIPHENHYDRAMINE HCL 25 MG PO CAPS
25.0000 mg | ORAL_CAPSULE | Freq: Four times a day (QID) | ORAL | Status: DC | PRN
  Administered 2020-05-05 (×3): 25 mg via ORAL
  Filled 2020-05-05 (×3): qty 1

## 2020-05-05 NOTE — Progress Notes (Signed)
Subjective: Patient reports that she is having a significant amount of low back pain that is requiring PO and IV pain medication to control. She is unable to tell if her preoperative low back pain and RLE pain has improved at this time due to the surgical pain she is experiencing. No acute events overnight.   Objective: Vital signs in last 24 hours: Temp:  [97.7 F (36.5 C)-98.3 F (36.8 C)] 98 F (36.7 C) (02/16 0731) Pulse Rate:  [86-100] 92 (02/16 0731) Resp:  [9-24] 16 (02/16 0731) BP: (101-183)/(61-106) 105/62 (02/16 0731) SpO2:  [91 %-100 %] 97 % (02/16 0731)  Intake/Output from previous day: 02/15 0701 - 02/16 0700 In: 1500 [I.V.:1250; IV Piggyback:250] Out: 225 [Urine:175; Blood:50] Intake/Output this shift: No intake/output data recorded.  Physical Exam: Patient is awake and comfortably sitting in the bedside chair with LSO brace on. She is A/O X 4, conversant, and in good spirits. Speech is fluent and appropriate. Doing well. MAEW with good strength. Dressing is clean dry intact. Incision is well approximated with no drainage, erythema, or edema.  Lab Results: No results for input(s): WBC, HGB, HCT, PLT in the last 72 hours. BMET No results for input(s): NA, K, CL, CO2, GLUCOSE, BUN, CREATININE, CALCIUM in the last 72 hours.  Studies/Results: DG Lumbar Spine 2-3 Views  Result Date: 05/04/2020 CLINICAL DATA:  Surgery, elective. Additional history provided: L4-5 TLIF. Provided fluoroscopy time 33 seconds (14.77 mGy). EXAM: LUMBAR SPINE - 2-3 VIEW; DG C-ARM 1-60 MIN COMPARISON:  MRI of the lumbar spine 12/26/2019. FINDINGS: PA and lateral view intraoperative fluoroscopic images of the lumbar spine are submitted, 3 images total. The images demonstrate bilateral pedicle screws at the L4 and L5 levels. Vertical interconnecting rods were not present at the time the images were taken. An interbody device is also now present at L4-L5. Overlying retractors. Partially imaged spinal  stimulator device. IMPRESSION: 3 intraoperative fluoroscopic images of the lumbar spine from L4-L5 fusion, as described. Electronically Signed   By: Kellie Simmering DO   On: 05/04/2020 10:22   DG C-Arm 1-60 Min  Result Date: 05/04/2020 CLINICAL DATA:  Surgery, elective. Additional history provided: L4-5 TLIF. Provided fluoroscopy time 33 seconds (14.77 mGy). EXAM: LUMBAR SPINE - 2-3 VIEW; DG C-ARM 1-60 MIN COMPARISON:  MRI of the lumbar spine 12/26/2019. FINDINGS: PA and lateral view intraoperative fluoroscopic images of the lumbar spine are submitted, 3 images total. The images demonstrate bilateral pedicle screws at the L4 and L5 levels. Vertical interconnecting rods were not present at the time the images were taken. An interbody device is also now present at L4-L5. Overlying retractors. Partially imaged spinal stimulator device. IMPRESSION: 3 intraoperative fluoroscopic images of the lumbar spine from L4-L5 fusion, as described. Electronically Signed   By: Kellie Simmering DO   On: 05/04/2020 10:22    Assessment/Plan: Patient is post-op day 1 s/p Right L4-5 TLIF and right L5-S1 microdiskectomy. Overall she is recovering well. She does have complaints about low back pain. She is requiring PO pain meds as well as IV pain med for breakthrough pain. She is having a significant amount of anxiety surrounding her pain and managing it after discharge. We will plan for her to stay tonight to further work on pain control and ensure she is able to manage her pain with PO analgesics.  She has ambulated with PT who is recommending intermittent supervision with ambulation when out of bed and no PT follow-up needed.  She is awaiting OT evaluation.  Continue  LSO brace when OOB. Continue working on pain control, mobility and ambulating patient. Will plan for discharge tomorrow.     LOS: 1 day     Marvis Moeller, DNP, NP-C 05/05/2020, 8:25 AM

## 2020-05-05 NOTE — Evaluation (Signed)
Occupational Therapy Evaluation Patient Details Name: Teresa Lee MRN: [id number] DOB: 1960-12-15 Today's Date: 05/05/2020    History of Present Illness Teresa Lee is a 60 year old female with complaints of low back pain and right lower extremity pain who underwent R lumber 4-5 PLIF. PMHx significant for Crohn's disease.   Clinical Impression   PTA patient was living with her spouse in a 2-level private residence with bedroom/bathroom on main level and was independent with ADLs/IADLs without AD. Patient notes occasional use of SPC with increased low back pain. Patient currently presents near baseline level of function demonstrating observed ADLs and ADL transfers with supervision A and min cues for adherence to back precautions. Patient declined use of RW this A.M. but would likely benefit from use short-term. OT provided education on spinal precautions, home set-up to maximize safety and independence with self-care tasks, and acquisition/use of AE. Patient expressed verbal understanding. Patient also able to don lumbar corset with supervision A. Patient with increased anxiety about maintaining precautions and would benefit from 1 more follow-up OT session to maximize safety/independence with self-care tasks in prep for return home.     Follow Up Recommendations  No OT follow up;Supervision - Intermittent    Equipment Recommendations  None recommended by OT (Patient has necessary DME.)    Recommendations for Other Services       Precautions / Restrictions Precautions Precautions: Back Precaution Booklet Issued: Yes (comment) Precaution Comments: pt with verbal understanding but requires freq v/c's to adhere functionally Required Braces or Orthoses: Spinal Brace Spinal Brace: Lumbar corset Restrictions Weight Bearing Restrictions: No      Mobility Bed Mobility Overal bed mobility: Needs Assistance Bed Mobility: Rolling;Sidelying to Sit;Sit to Sidelying Rolling: Min  guard Sidelying to sit: Min guard     Sit to sidelying: Min guard General bed mobility comments: Step by step cues for log rolling technique +rail.    Transfers Overall transfer level: Needs assistance Equipment used: None Transfers: Sit to/from Stand Sit to Stand: Min guard         General transfer comment: Increased time 2/2 pain at incision.    Balance Overall balance assessment: Mild deficits observed, not formally tested                                         ADL either performed or assessed with clinical judgement   ADL Overall ADL's : Needs assistance/impaired                                       General ADL Comments: Patient demonstrates UB/LB dressing and ADL transfers with supervision A. Min cues for adherence to back precautions.     Vision Baseline Vision/History: Wears glasses Wears Glasses:  (While driving) Patient Visual Report: No change from baseline       Perception     Praxis      Pertinent Vitals/Pain Pain Assessment: 0-10 Pain Score: 5  Pain Location: back Pain Descriptors / Indicators: Sore;Operative site guarding Pain Intervention(s): Monitored during session;Repositioned;Limited activity within patient's tolerance     Hand Dominance Right   Extremity/Trunk Assessment Upper Extremity Assessment Upper Extremity Assessment: Overall WFL for tasks assessed   Lower Extremity Assessment Lower Extremity Assessment: Defer to PT evaluation   Cervical / Trunk Assessment Cervical / Trunk Assessment: Other exceptions  Cervical / Trunk Exceptions: s/p spinal surgery   Communication Communication Communication: No difficulties   Cognition Arousal/Alertness: Awake/alert Behavior During Therapy: WFL for tasks assessed/performed Overall Cognitive Status: Within Functional Limits for tasks assessed                                     General Comments  Clean, dry dressing at incision. Patient  with anxiety about adherence to back precautions. All questions answered to patient satisfaction.    Exercises     Shoulder Instructions      Home Living Family/patient expects to be discharged to:: Private residence Living Arrangements: Spouse/significant other Available Help at Discharge: Family Type of Home: House Home Access: Stairs to enter Technical brewer of Steps: 2 Entrance Stairs-Rails: None Home Layout: Able to live on main level with bedroom/bathroom;Two level Alternate Level Stairs-Number of Steps: 12   Bathroom Shower/Tub: Teacher, early years/pre: Standard Bathroom Accessibility: Yes   Home Equipment: Cane - single point;Bedside commode          Prior Functioning/Environment Level of Independence: Independent        Comments: uses cane occasionally        OT Problem List: Decreased strength;Decreased knowledge of precautions;Pain      OT Treatment/Interventions: Self-care/ADL training;Therapeutic exercise;Energy conservation;DME and/or AE instruction;Therapeutic activities;Patient/family education;Balance training    OT Goals(Current goals can be found in the care plan section) Acute Rehab OT Goals Patient Stated Goal: To return home. OT Goal Formulation: With patient Time For Goal Achievement: 05/19/20 Potential to Achieve Goals: Good ADL Goals Additional ADL Goal #1: Patient will complete morning ADLs with Mod I and LRAD with good adherence to back precautions. Additional ADL Goal #2: Patient will recall 3/3 back precautions without cueing in prep for ADLs.  OT Frequency: Other (comment) (1 follow-up OT treatment session in prep for d/c home.)   Barriers to D/C:            Co-evaluation              AM-PAC OT "6 Clicks" Lee Activity     Outcome Measure Help from another person eating meals?: None Help from another person taking care of personal grooming?: None Help from another person toileting, which includes  using toliet, bedpan, or urinal?: A Little Help from another person bathing (including washing, rinsing, drying)?: A Little Help from another person to put on and taking off regular upper body clothing?: None Help from another person to put on and taking off regular lower body clothing?: None 6 Click Score: 22   End of Session Equipment Utilized During Treatment: Rolling walker;Back brace Nurse Communication: Mobility status  Activity Tolerance: Patient tolerated treatment well Patient left: in chair;with call bell/phone within reach  OT Visit Diagnosis: Unsteadiness on feet (R26.81);Other abnormalities of gait and mobility (R26.89);Muscle weakness (generalized) (M62.81)                Time: 7673-4193 OT Time Calculation (min): 22 min Charges:  OT General Charges $OT Visit: 1 Visit OT Evaluation $OT Eval Moderate Complexity: 1 Mod  Teresa Lee. OTR/L Supplemental OT, Department of rehab services [phone]  Teresa Lee R Lee. 05/05/2020, 9:21 AM

## 2020-05-05 NOTE — Progress Notes (Signed)
Physical Therapy Treatment Patient Details Name: Teresa Lee MRN: [id number] DOB: February 24, 1961 Today's Date: 05/05/2020    History of Present Illness Keslyn Teater is a 60 year old female with complaints of low back pain and right lower extremity pain who underwent R lumber 4-5 PLIF. PMHx significant for Crohn's disease.    PT Comments    Pt seen for a second session in attempt to progress gait training/initiate stair training. Pt reports she has recently ambulated in the hall with nursing staff and declines further gait training at this time. Pt reports increased pain and is requesting pain medication - RN notified. Focus of session shifted to bed mobility and safe positioning when turning from L sidelying to R sidelying. Encouraged pillow between knees and to minimize bridging hips up in the air to reposition to avoid excessive arching in the back. Will continue to follow.     Follow Up Recommendations  No PT follow up;Supervision for mobility/OOB     Equipment Recommendations  Rolling walker with 5" wheels    Recommendations for Other Services       Precautions / Restrictions Precautions Precautions: Back Precaution Booklet Issued: Yes (comment) Precaution Comments: Pt was cued for precautions during functional mobility. Required Braces or Orthoses: Spinal Brace Spinal Brace: Lumbar corset Restrictions Weight Bearing Restrictions: No    Mobility  Bed Mobility Overal bed mobility: Needs Assistance Bed Mobility: Rolling;Sidelying to Sit Rolling: Supervision Sidelying to sit: Supervision     Sit to sidelying: Supervision General bed mobility comments: Practiced several times for proper log roll sequencing. Encouraged pt to avoid arching back up in supine to scoot and reposition.    Transfers Overall transfer level: Needs assistance Equipment used: None;Rolling walker (2 wheeled) Transfers: Sit to/from Stand Sit to Stand: Supervision         General transfer  comment: Pt declined due to pain  Ambulation/Gait Ambulation/Gait assistance: Min guard Gait Distance (Feet): 250 Feet Assistive device: Rolling walker (2 wheeled) Gait Pattern/deviations: Step-through pattern;Decreased stride length Gait velocity: Decreased Gait velocity interpretation: <1.31 ft/sec, indicative of household ambulator General Gait Details: Pt declined additional gait training as she reports she just got back to the room after ambulating in the hall with nursing staff.   Stairs             Wheelchair Mobility    Modified Rankin (Stroke Patients Only)       Balance Overall balance assessment: Mild deficits observed, not formally tested (In sitting)                                          Cognition Arousal/Alertness: Awake/alert Behavior During Therapy: WFL for tasks assessed/performed Overall Cognitive Status: Within Functional Limits for tasks assessed                                        Exercises      General Comments        Pertinent Vitals/Pain Pain Assessment: Faces Faces Pain Scale: Hurts whole lot Pain Location: back Pain Descriptors / Indicators: Sore;Operative site guarding Pain Intervention(s): Limited activity within patient's tolerance;Monitored during session;Repositioned    Home Living                      Prior Function  PT Goals (current goals can now be found in the care plan section) Acute Rehab PT Goals Patient Stated Goal: To return home. PT Goal Formulation: With patient Potential to Achieve Goals: Good Progress towards PT goals: Progressing toward goals    Frequency    Min 5X/week      PT Plan Current plan remains appropriate    Co-evaluation              AM-PAC PT "6 Clicks" Mobility   Outcome Measure  Help needed turning from your back to your side while in a flat bed without using bedrails?: A Little Help needed moving from lying on  your back to sitting on the side of a flat bed without using bedrails?: A Little Help needed moving to and from a bed to a chair (including a wheelchair)?: A Little Help needed standing up from a chair using your arms (e.g., wheelchair or bedside chair)?: A Little Help needed to walk in hospital room?: A Little Help needed climbing 3-5 steps with a railing? : A Little 6 Click Score: 18    End of Session Equipment Utilized During Treatment: Gait belt Activity Tolerance: Patient tolerated treatment well Patient left: in bed;with call bell/phone within reach Nurse Communication: Mobility status;Patient requests pain meds PT Visit Diagnosis: Muscle weakness (generalized) (M62.81);Difficulty in walking, not elsewhere classified (R26.2)     Time: 5974-1638 PT Time Calculation (min) (ACUTE ONLY): 18 min  Charges:  $Gait Training: 23-37 mins $Therapeutic Activity: 8-22 mins                     Rolinda Roan, PT, DPT Acute Rehabilitation Services Pager: [phone] Office: [phone]    Thelma Comp 05/05/2020, 2:23 PM

## 2020-05-05 NOTE — Progress Notes (Signed)
Physical Therapy Treatment Patient Details Name: Teresa Lee MRN: [id number] DOB: 07-Jun-1960 Today's Date: 05/05/2020    History of Present Illness Teresa Lee is a 60 year old female with complaints of low back pain and right lower extremity pain who underwent R lumber 4-5 PLIF. PMHx significant for Crohn's disease.    PT Comments    Pt progressing towards physical therapy goals. Was able to perform transfers and ambulation with gross min guard assist and RW for support. She reports feeling more comfortable with RW. Will need a youth size walker for d/c. Pt reports she is going home tomorrow. Will initiate stair training prior to d/c.    Follow Up Recommendations  No PT follow up;Supervision - Intermittent     Equipment Recommendations  Rolling walker with 5" wheels (youth size)   Recommendations for Other Services       Precautions / Restrictions Precautions Precautions: Back Precaution Booklet Issued: Yes (comment) Precaution Comments: Pt was cued for precautions during functional mobility. Required Braces or Orthoses: Spinal Brace Spinal Brace: Lumbar corset Restrictions Weight Bearing Restrictions: No    Mobility  Bed Mobility Overal bed mobility: Needs Assistance Bed Mobility: Rolling;Sidelying to Sit Rolling: Supervision Sidelying to sit: Supervision     Sit to sidelying: Min guard General bed mobility comments: VC's for sequencing and general safety. No assist required but VC's throughout for optimal log roll technique.    Transfers Overall transfer level: Needs assistance Equipment used: None;Rolling walker (2 wheeled) Transfers: Sit to/from Stand Sit to Stand: Supervision         General transfer comment: VC's for hand placement on seated surface for safety.  Ambulation/Gait Ambulation/Gait assistance: Min guard Gait Distance (Feet): 250 Feet Assistive device: Rolling walker (2 wheeled) Gait Pattern/deviations: Step-through  pattern;Decreased stride length Gait velocity: Decreased Gait velocity interpretation: <1.31 ft/sec, indicative of household ambulator General Gait Details: Initially without AD and pt limited by pain. With RW, pt reports feeling more comfortable and was able to tolerate a longer walk for gait training.   Stairs             Wheelchair Mobility    Modified Rankin (Stroke Patients Only)       Balance Overall balance assessment: Mild deficits observed, not formally tested                                          Cognition Arousal/Alertness: Awake/alert Behavior During Therapy: WFL for tasks assessed/performed Overall Cognitive Status: Within Functional Limits for tasks assessed                                        Exercises      General Comments General comments (skin integrity, edema, etc.): Clean, dry dressing at incision. Patient with anxiety about adherence to back precautions. All questions answered to patient satisfaction.      Pertinent Vitals/Pain Pain Assessment: Faces Pain Score: 5  Faces Pain Scale: Hurts even more Pain Location: back Pain Descriptors / Indicators: Sore;Operative site guarding Pain Intervention(s): Limited activity within patient's tolerance;Monitored during session;Repositioned    Home Living Family/patient expects to be discharged to:: Private residence Living Arrangements: Spouse/significant other Available Help at Discharge: Family Type of Home: House Home Access: Stairs to enter Entrance Stairs-Rails: None Home Layout: Able to live on main level  with bedroom/bathroom;Two level Home Equipment: Cane - single point;Bedside commode      Prior Function Level of Independence: Independent      Comments: uses cane occasionally   PT Goals (current goals can now be found in the care plan section) Acute Rehab PT Goals Patient Stated Goal: To return home. PT Goal Formulation: With patient Potential  to Achieve Goals: Good Progress towards PT goals: Progressing toward goals    Frequency    Min 5X/week      PT Plan Current plan remains appropriate    Co-evaluation              AM-PAC PT "6 Clicks" Mobility   Outcome Measure  Help needed turning from your back to your side while in a flat bed without using bedrails?: A Little Help needed moving from lying on your back to sitting on the side of a flat bed without using bedrails?: A Little Help needed moving to and from a bed to a chair (including a wheelchair)?: A Little Help needed standing up from a chair using your arms (e.g., wheelchair or bedside chair)?: A Little Help needed to walk in hospital room?: A Little Help needed climbing 3-5 steps with a railing? : A Little 6 Click Score: 18    End of Session Equipment Utilized During Treatment: Gait belt Activity Tolerance: Patient tolerated treatment well Patient left: in bed;with call bell/phone within reach;with family/visitor present Nurse Communication: Mobility status PT Visit Diagnosis: Muscle weakness (generalized) (M62.81);Difficulty in walking, not elsewhere classified (R26.2)     Time: 8333-8329 PT Time Calculation (min) (ACUTE ONLY): 32 min  Charges:  $Gait Training: 23-37 mins                     Rolinda Roan, PT, DPT Acute Rehabilitation Services Pager: [phone] Office: [phone]    Thelma Comp 05/05/2020, 12:43 PM

## 2020-05-06 MED ORDER — METHOCARBAMOL 750 MG PO TABS
750.0000 mg | ORAL_TABLET | Freq: Four times a day (QID) | ORAL | Status: DC | PRN
  Administered 2020-05-06 (×2): 750 mg via ORAL
  Filled 2020-05-06: qty 1

## 2020-05-06 MED FILL — Sodium Chloride IV Soln 0.9%: INTRAVENOUS | Qty: 1000 | Status: AC

## 2020-05-06 MED FILL — Heparin Sodium (Porcine) Inj 1000 Unit/ML: INTRAMUSCULAR | Qty: 30 | Status: AC

## 2020-05-06 NOTE — Progress Notes (Signed)
Occupational Therapy Treatment Patient Details Name: Teresa Lee MRN: [id number] DOB: [birth year] Today's Date: 05/06/2020    History of present illness Teresa Lee is a 60 year old female with complaints of low back pain and right lower extremity pain who underwent R lumber 4-5 PLIF. PMHx significant for Crohn's disease.   OT comments  OT treatment session with focus on self-care re-education, ADL transfers, and short-distance functional mobility with use of RW in prep for ADLs. Patient making progress toward goals with ability to recall 3/3 back precautions this date. Patient continues to require Min cues for adherence to back precautions during ADLs. Patient limited by anxiety and pain in low back despite premedication. Patient is safe to d/c home with family and use of RW. Continued recommendation for no follow-up OT.     Follow Up Recommendations  No OT follow up;Supervision - Intermittent    Equipment Recommendations  None recommended by OT (Patient has necessary DME.)    Recommendations for Other Services      Precautions / Restrictions Precautions Precautions: Back Precaution Booklet Issued: Yes (comment) Precaution Comments: pt with verbal understanding but requires freq v/c's to adhere functionally Required Braces or Orthoses: Spinal Brace Spinal Brace: Lumbar corset Restrictions Weight Bearing Restrictions: No       Mobility Bed Mobility Overal bed mobility: Needs Assistance             General bed mobility comments: Patient walking to bathroom upon entry.  Transfers Overall transfer level: Needs assistance Equipment used: Rolling walker (2 wheeled) Transfers: Sit to/from Stand Sit to Stand: Min guard;Supervision         General transfer comment: Increased time 2/2 pain at incision. Cues for hand placement as patient attempts to sit with both hands on RW.    Balance Overall balance assessment: Mild deficits observed, not formally tested                                          ADL either performed or assessed with clinical judgement   ADL Overall ADL's : Needs assistance/impaired     Grooming: Supervision/safety;Standing Grooming Details (indicate cue type and reason): Hand/face washing standing at sink level with supervision A and use of RW for safety.         Upper Body Dressing : Set up;Sitting Upper Body Dressing Details (indicate cue type and reason): Seated on commode. Lower Body Dressing: Supervision/safety;Sit to/from stand Lower Body Dressing Details (indicate cue type and reason): Cues for hand placement and adherence to back precautions. Toilet Transfer: Designer, television/film set Details (indicate cue type and reason): Patient attempting to sit with both hands still on RW. Cues for hand placement. Toileting- Clothing Manipulation and Hygiene: Supervision/safety;Sit to/from stand Toileting - Clothing Manipulation Details (indicate cue type and reason): Sup A and cues for technique.     Functional mobility during ADLs: Supervision/safety;Rolling walker       Vision       Perception     Praxis      Cognition Arousal/Alertness: Awake/alert Behavior During Therapy: Anxious Overall Cognitive Status: Within Functional Limits for tasks assessed                                 General Comments: Patient with continued anxiety stating "I just want to do everything perfect. I don't want to  do anything wrong."        Exercises     Shoulder Instructions       General Comments      Pertinent Vitals/ Pain       Pain Assessment: 0-10 Pain Score: 8  Pain Location: back Pain Descriptors / Indicators: Sore;Operative site guarding Pain Intervention(s): Limited activity within patient's tolerance;Monitored during session;Repositioned;Patient requesting pain meds-RN notified  Home Living                                          Prior  Functioning/Environment              Frequency  Other (comment) (1 follow-up OT treatment session in prep for d/c home.)        Progress Toward Goals  OT Goals(current goals can now be found in the care plan section)  Progress towards OT goals: Progressing toward goals  Acute Rehab OT Goals Patient Stated Goal: To return home. OT Goal Formulation: With patient Time For Goal Achievement: 05/19/20 Potential to Achieve Goals: Good ADL Goals Additional ADL Goal #1: Patient will complete morning ADLs with Mod I and LRAD with good adherence to back precautions. Additional ADL Goal #2: Patient will recall 3/3 back precautions without cueing in prep for ADLs.  Plan Discharge plan remains appropriate;Frequency remains appropriate    Co-evaluation                 AM-PAC OT "6 Clicks" Daily Activity     Outcome Measure   Help from another person eating meals?: None Help from another person taking care of personal grooming?: None Help from another person toileting, which includes using toliet, bedpan, or urinal?: A Little Help from another person bathing (including washing, rinsing, drying)?: A Little Help from another person to put on and taking off regular upper body clothing?: None Help from another person to put on and taking off regular lower body clothing?: None 6 Click Score: 22    End of Session Equipment Utilized During Treatment: Rolling walker;Back brace  OT Visit Diagnosis: Unsteadiness on feet (R26.81);Other abnormalities of gait and mobility (R26.89);Muscle weakness (generalized) (M62.81)   Activity Tolerance Patient tolerated treatment well   Patient Left in chair;with call bell/phone within reach   Nurse Communication Mobility status        Time: 1572-6203 OT Time Calculation (min): 19 min  Charges: OT General Charges $OT Visit: 1 Visit OT Treatments $Self Care/Home Management : 8-22 mins  Aleyah Balik H. OTR/L Supplemental OT, Department of  rehab services [phone]   Joshoa Shawler R H. 05/06/2020, 9:57 AM

## 2020-05-06 NOTE — Discharge Instructions (Signed)
Wound Care Leave incision open to air. You may shower. Do not scrub directly on incision.  Do not put any creams, lotions, or ointments on incision. Activity Walk each and every day, increasing distance each day. No lifting greater than 5 lbs.  Avoid bending, arching, and twisting. No driving for 2 weeks; may ride as a passenger locally. If provided with back brace, wear when out of bed.  It is not necessary to wear in bed. Diet Resume your normal diet.  Return to Work Will be discussed at you follow up appointment. Call Your Doctor If Any of These Occur Redness, drainage, or swelling at the wound.  Temperature greater than 101 degrees. Severe pain not relieved by pain medication. Incision starts to come apart. Follow Up Appt Call today for appointment in 3-4 weeks ([phone]) or for problems.  If you have any hardware placed in your spine, you will need an x-ray before your appointment.

## 2020-05-06 NOTE — Discharge Summary (Signed)
Physician Discharge Summary  Patient ID: Teresa Lee MRN: [id number] DOB/AGE: 1960/11/17 60 y.o.  Admit date: 05/04/2020 Discharge date: 05/06/2020  Admission Diagnoses:  Spondylolisthesis of lumbar region, Herniated nucleus pulposus, Lumbar, lumbago, lumbar radiculopathy  Discharge Diagnoses:  Spondylolisthesis of lumbar region, Herniated nucleus pulposus, Lumbar, lumbago, lumbar radiculopathy Active Problems:   Spondylolisthesis of lumbar region   Discharged Condition: good  Hospital Course: The patient was admitted on 05/04/2020 and taken to the operating room where the patient underwent right L4/5 TLIF and right L5/S1 microdiskectomy. The patient tolerated the procedure well and was taken to the recovery room and then to the floor in stable condition. The hospital course was routine. There were no complications. The wound remained clean dry and intact. Pt had appropriate back soreness. No complaints of leg pain or new N/T/W. The patient remained afebrile with stable vital signs, and tolerated a regular diet. The patient continued to increase activities, and pain was well controlled with oral pain medications.   Consults: None  Significant Diagnostic Studies: radiology: X-ray  Treatments: surgery: Right Lumbar Four-Five Transforaminal lumbar interbody fusion (Right) - posterior Right Lumbar Five Sacral One Microdiscectomy (Right) - posterior with pedicle screw fixation and posterolateral arthrodesis  Discharge Exam: Blood pressure 126/75, pulse (!) 103, temperature 98.2 F (36.8 C), temperature source Oral, resp. rate 18, height 5' (1.524 m), weight 70.3 kg, SpO2 95 %.  Physical Exam: Patient is awake and sitting comfortably on the side of the bed with LSO brace on. She is A/O X 4, conversant, and in good spirits. Speech is fluent and appropriate. She is continuing to recover well. MAEW with good strength. Dressing is clean dry intact. Incision is well approximated with no  drainage, erythema, or edema.  Disposition: Discharge disposition: 01-Home or Self Care        Allergies as of 05/06/2020      Reactions   Latex Other (See Comments)   BLISTER   Lorazepam    Hallucinations   Orange Concentrate [flavoring Agent]    Acid reflux   Other Hives, Nausea Only, Other (See Comments)   Some nuts cause a rash   Percocet [oxycodone-acetaminophen] Hives   Sulfa Antibiotics Hives   Tomato    Burns skin   Bioflavonoids Rash   Causes burning of skin and blisters   Cefixime    Due to myasthenia gravis   Nucynta [tapentadol]    Headache       Medication List    STOP taking these medications   meloxicam 15 MG tablet Commonly known as: MOBIC     TAKE these medications   AeroChamber Plus inhaler Use as instructed to use with inahaler.   albuterol 108 (90 Base) MCG/ACT inhaler Commonly known as: VENTOLIN HFA 1 PUFF EVERY 6 HOURS AS NEEDED FOR WHEEZING What changed: See the new instructions.   albuterol (2.5 MG/3ML) 0.083% nebulizer solution Commonly known as: PROVENTIL Take 2.5 mg by nebulization every 6 (six) hours as needed for shortness of breath. What changed: Another medication with the same name was changed. Make sure you understand how and when to take each.   ALPRAZolam 0.5 MG tablet Commonly known as: XANAX Take 0.5 mg by mouth at bedtime.   cyanocobalamin 1000 MCG/ML injection Commonly known as: (VITAMIN B-12) Inject 1,000 mcg into the muscle every 30 (thirty) days.   desmopressin 0.1 MG tablet Commonly known as: DDAVP Take 0.3 mg by mouth at bedtime.   estradiol 2 MG tablet Commonly known as: ESTRACE Take 4  mg by mouth at bedtime.   HYDROcodone-acetaminophen 10-325 MG tablet Commonly known as: NORCO Take 1 tablet by mouth 5 (five) times daily as needed for moderate pain.   losartan 50 MG tablet Commonly known as: COZAAR TAKE 1 TABLET DAILY   Mesalamine 400 MG Cpdr DR capsule Commonly known as: ASACOL Take 800 mg by  mouth 2 (two) times daily.   methocarbamol 750 MG tablet Commonly known as: ROBAXIN Take 750 mg by mouth at bedtime.   mycophenolate 500 MG tablet Commonly known as: CELLCEPT Take 1,000 mg by mouth 2 (two) times daily.   omega-3 acid ethyl esters 1 g capsule Commonly known as: LOVAZA Take 1 capsule (1 g total) by mouth 2 (two) times daily.   ondansetron 4 MG tablet Commonly known as: ZOFRAN Take 1 tablet (4 mg total) by mouth every 8 (eight) hours as needed for nausea or vomiting.   pantoprazole 40 MG tablet Commonly known as: PROTONIX TAKE 1 TABLET DAILY What changed: when to take this   potassium chloride SA 20 MEQ tablet Commonly known as: KLOR-CON TAKE 1 TABLET DAILY What changed: when to take this   predniSONE 10 MG tablet Commonly known as: DELTASONE Take 10 mg by mouth daily.   progesterone 100 MG capsule Commonly known as: PROMETRIUM Take 100 mg by mouth at bedtime.   pyridostigmine 60 MG tablet Commonly known as: MESTINON Take 30 mg by mouth daily.   rOPINIRole 1 MG tablet Commonly known as: REQUIP Take 1-2 tablets (1-2 mg total) by mouth at bedtime.   rosuvastatin 40 MG tablet Commonly known as: Crestor Take 1 tablet (40 mg total) by mouth daily.   Symbicort 80-4.5 MCG/ACT inhaler Generic drug: budesonide-formoterol Inhale 2 puffs into the lungs 2 (two) times daily. What changed: See the new instructions.   triamcinolone 0.1 % Commonly known as: KENALOG Apply 1 application topically 2 (two) times daily.   triamterene-hydrochlorothiazide 37.5-25 MG capsule Commonly known as: DYAZIDE TAKE (1) CAPSULE DAILY IN THE MORNING. What changed: See the new instructions.   Vitamin D (Ergocalciferol) 1.25 MG (50000 UNIT) Caps capsule Commonly known as: DRISDOL 1 CAPSULE EVERY 2 WEEKS What changed:   how much to take  how to take this  when to take this  additional instructions        Signed: Marvis Moeller, DNP, NP-C 05/06/2020, 8:35  AM

## 2020-05-06 NOTE — Progress Notes (Signed)
Physical Therapy Treatment Patient Details Name: Teresa Lee MRN: [id number] DOB: Aug 29, 1960 Today's Date: 05/06/2020    History of Present Illness Teresa Lee is a 60 year old female with complaints of low back pain and right lower extremity pain who underwent R lumber 4-5 PLIF. PMHx significant for Crohn's disease.    PT Comments    Pt progressing well with post-op mobility. She was able to demonstrate transfers and ambulation with gross min guard assist to supervision for safety with RW for support. Pt was educated on precautions, brace application/wearing schedule, appropriate activity progression, and car transfer. Will continue to follow.      Follow Up Recommendations  No PT follow up;Supervision for mobility/OOB     Equipment Recommendations  Rolling walker with 5" wheels    Recommendations for Other Services       Precautions / Restrictions Precautions Precautions: Back Precaution Booklet Issued: Yes (comment) Precaution Comments: pt with verbal understanding but requires freq v/c's to adhere functionally Required Braces or Orthoses: Spinal Brace Spinal Brace: Lumbar corset Restrictions Weight Bearing Restrictions: No    Mobility  Bed Mobility               General bed mobility comments: Pt was received sitting up in the recliner.    Transfers Overall transfer level: Needs assistance Equipment used: Rolling walker (2 wheeled) Transfers: Sit to/from Stand Sit to Stand: Supervision         General transfer comment: Supervision provided for safety. No assist required however VC's throughout for optimal sequencing/safety.  Ambulation/Gait Ambulation/Gait assistance: Supervision Gait Distance (Feet): 200 Feet Assistive device: Rolling walker (2 wheeled) Gait Pattern/deviations: Step-through pattern;Decreased stride length Gait velocity: Decreased Gait velocity interpretation: <1.31 ft/sec, indicative of household ambulator General Gait  Details: VC's throughout for improved posture, closer walker proximity, and forward gaze.   Stairs Stairs: Yes Stairs assistance: Min guard Stair Management: One rail Left;Step to pattern;Forwards Number of Stairs: 3 General stair comments: VC's for sequencing and general safety. No assist required however hands on guarding provided for safety.   Wheelchair Mobility    Modified Rankin (Stroke Patients Only)       Balance Overall balance assessment: Mild deficits observed, not formally tested                                          Cognition Arousal/Alertness: Awake/alert Behavior During Therapy: Anxious Overall Cognitive Status: Within Functional Limits for tasks assessed                                        Exercises      General Comments        Pertinent Vitals/Pain Pain Assessment: Faces Faces Pain Scale: Hurts even more Pain Location: back Pain Descriptors / Indicators: Sore;Operative site guarding Pain Intervention(s): Limited activity within patient's tolerance;Monitored during session;Repositioned;Patient requesting pain meds-RN notified    Home Living                      Prior Function            PT Goals (current goals can now be found in the care plan section) Acute Rehab PT Goals Patient Stated Goal: To return home. PT Goal Formulation: With patient Potential to Achieve Goals: Good Progress towards PT goals:  Progressing toward goals    Frequency    Min 5X/week      PT Plan Current plan remains appropriate    Co-evaluation              AM-PAC PT "6 Clicks" Mobility   Outcome Measure  Help needed turning from your back to your side while in a flat bed without using bedrails?: A Little Help needed moving from lying on your back to sitting on the side of a flat bed without using bedrails?: A Little Help needed moving to and from a bed to a chair (including a wheelchair)?: A  Little Help needed standing up from a chair using your arms (e.g., wheelchair or bedside chair)?: A Little Help needed to walk in hospital room?: A Little Help needed climbing 3-5 steps with a railing? : A Little 6 Click Score: 18    End of Session Equipment Utilized During Treatment: Gait belt Activity Tolerance: Patient tolerated treatment well Patient left: in bed;with call bell/phone within reach Nurse Communication: Mobility status;Patient requests pain meds PT Visit Diagnosis: Muscle weakness (generalized) (M62.81);Difficulty in walking, not elsewhere classified (R26.2)     Time: 9122-5834 PT Time Calculation (min) (ACUTE ONLY): 19 min  Charges:  $Gait Training: 8-22 mins                     Rolinda Roan, PT, DPT Acute Rehabilitation Services Pager: [phone] Office: [phone]    Thelma Comp 05/06/2020, 2:23 PM

## 2020-05-06 NOTE — Plan of Care (Signed)
Pt given D/C instructions with verbal understanding. Rx's were sent to the pharmacy by MD. Pt's incision is clean and dry with no sign of infection. Pt's IV was removed prior to D/C. Pt received RW from [employer] per MD order. Pt D/C'd home via wheelchair per MD order. Pt is stable @ D/C and has no other needs at this time. Holli Humbles, RN

## 2020-05-06 NOTE — Progress Notes (Signed)
Subjective: Patient reports that she is continuing to have a moderate amount of low back pain. She has no other complaints. No acute events overnight.   Objective: Vital signs in last 24 hours: Temp:  [97.8 F (36.6 C)-98.3 F (36.8 C)] 98.2 F (36.8 C) (02/17 0829) Pulse Rate:  [80-103] 103 (02/17 0829) Resp:  [16-18] 18 (02/17 0829) BP: (110-126)/(57-75) 126/75 (02/17 0829) SpO2:  [95 %-100 %] 95 % (02/17 0829)  Intake/Output from previous day: No intake/output data recorded. Intake/Output this shift: No intake/output data recorded.  Physical Exam: Patient is awake and sitting comfortably on the side of the bed with LSO brace on. She is A/O X 4, conversant, and in good spirits. Speech is fluent and appropriate. She is continuing to recover well. MAEW with good strength. Dressing is clean dry intact. Incision is well approximated with no drainage, erythema, or edema. LSO brace in use.  Lab Results: No results for input(s): WBC, HGB, HCT, PLT in the last 72 hours. BMET No results for input(s): NA, K, CL, CO2, GLUCOSE, BUN, CREATININE, CALCIUM in the last 72 hours.  Studies/Results: DG Lumbar Spine 2-3 Views  Result Date: 05/04/2020 CLINICAL DATA:  Surgery, elective. Additional history provided: L4-5 TLIF. Provided fluoroscopy time 33 seconds (14.77 mGy). EXAM: LUMBAR SPINE - 2-3 VIEW; DG C-ARM 1-60 MIN COMPARISON:  MRI of the lumbar spine 12/26/2019. FINDINGS: PA and lateral view intraoperative fluoroscopic images of the lumbar spine are submitted, 3 images total. The images demonstrate bilateral pedicle screws at the L4 and L5 levels. Vertical interconnecting rods were not present at the time the images were taken. An interbody device is also now present at L4-L5. Overlying retractors. Partially imaged spinal stimulator device. IMPRESSION: 3 intraoperative fluoroscopic images of the lumbar spine from L4-L5 fusion, as described. Electronically Signed   By: Kellie Simmering DO   On:  05/04/2020 10:22   DG C-Arm 1-60 Min  Result Date: 05/04/2020 CLINICAL DATA:  Surgery, elective. Additional history provided: L4-5 TLIF. Provided fluoroscopy time 33 seconds (14.77 mGy). EXAM: LUMBAR SPINE - 2-3 VIEW; DG C-ARM 1-60 MIN COMPARISON:  MRI of the lumbar spine 12/26/2019. FINDINGS: PA and lateral view intraoperative fluoroscopic images of the lumbar spine are submitted, 3 images total. The images demonstrate bilateral pedicle screws at the L4 and L5 levels. Vertical interconnecting rods were not present at the time the images were taken. An interbody device is also now present at L4-L5. Overlying retractors. Partially imaged spinal stimulator device. IMPRESSION: 3 intraoperative fluoroscopic images of the lumbar spine from L4-L5 fusion, as described. Electronically Signed   By: Kellie Simmering DO   On: 05/04/2020 10:22    Assessment/Plan: Patient is post-op day 2 s/p Right L4-5 TLIF and right L5-S1 microdiskectomy. She is recovering well. Her only complaint is of low back soreness that appears to be in proportion to what should be expected from post surgical pain. She has worked with PT/OT and is ambulating well with assistance. Continue LSO brace when OOB. Continue working on pain control, mobility and ambulating patient. Will plan for discharge today.   LOS: 2 days     Marvis Moeller, DNP. NP-C 05/06/2020, 8:34 AM

## 2020-05-22 ENCOUNTER — Encounter (HOSPITAL_BASED_OUTPATIENT_CLINIC_OR_DEPARTMENT_OTHER): Payer: Self-pay | Admitting: Emergency Medicine

## 2020-05-22 ENCOUNTER — Other Ambulatory Visit: Payer: Self-pay

## 2020-05-22 ENCOUNTER — Inpatient Hospital Stay (HOSPITAL_BASED_OUTPATIENT_CLINIC_OR_DEPARTMENT_OTHER)
Admission: EM | Admit: 2020-05-22 | Discharge: 2020-05-28 | DRG: 683 | Disposition: A | Discharge status: Home / Self Care | Payer: Medicare HMO | Attending: Internal Medicine | Admitting: Internal Medicine

## 2020-05-22 ENCOUNTER — Emergency Department (HOSPITAL_BASED_OUTPATIENT_CLINIC_OR_DEPARTMENT_OTHER): Payer: Medicare HMO

## 2020-05-22 DIAGNOSIS — K5080 Crohn's disease of both small and large intestine without complications: Secondary | ICD-10-CM | POA: Diagnosis present

## 2020-05-22 DIAGNOSIS — J4520 Mild intermittent asthma, uncomplicated: Secondary | ICD-10-CM | POA: Diagnosis not present

## 2020-05-22 DIAGNOSIS — Z83438 Family history of other disorder of lipoprotein metabolism and other lipidemia: Secondary | ICD-10-CM | POA: Diagnosis not present

## 2020-05-22 DIAGNOSIS — N179 Acute kidney failure, unspecified: Secondary | ICD-10-CM | POA: Diagnosis present

## 2020-05-22 DIAGNOSIS — G7000 Myasthenia gravis without (acute) exacerbation: Secondary | ICD-10-CM | POA: Diagnosis not present

## 2020-05-22 DIAGNOSIS — Z91018 Allergy to other foods: Secondary | ICD-10-CM | POA: Diagnosis not present

## 2020-05-22 DIAGNOSIS — I10 Essential (primary) hypertension: Secondary | ICD-10-CM | POA: Diagnosis present

## 2020-05-22 DIAGNOSIS — R112 Nausea with vomiting, unspecified: Secondary | ICD-10-CM

## 2020-05-22 DIAGNOSIS — D72829 Elevated white blood cell count, unspecified: Secondary | ICD-10-CM | POA: Diagnosis not present

## 2020-05-22 DIAGNOSIS — M797 Fibromyalgia: Secondary | ICD-10-CM | POA: Diagnosis present

## 2020-05-22 DIAGNOSIS — K5090 Crohn's disease, unspecified, without complications: Secondary | ICD-10-CM | POA: Diagnosis present

## 2020-05-22 DIAGNOSIS — E785 Hyperlipidemia, unspecified: Secondary | ICD-10-CM | POA: Diagnosis present

## 2020-05-22 DIAGNOSIS — Z79899 Other long term (current) drug therapy: Secondary | ICD-10-CM | POA: Diagnosis not present

## 2020-05-22 DIAGNOSIS — H5462 Unqualified visual loss, left eye, normal vision right eye: Secondary | ICD-10-CM | POA: Diagnosis present

## 2020-05-22 DIAGNOSIS — Z8249 Family history of ischemic heart disease and other diseases of the circulatory system: Secondary | ICD-10-CM | POA: Diagnosis not present

## 2020-05-22 DIAGNOSIS — K635 Polyp of colon: Secondary | ICD-10-CM | POA: Diagnosis present

## 2020-05-22 DIAGNOSIS — Z7952 Long term (current) use of systemic steroids: Secondary | ICD-10-CM

## 2020-05-22 DIAGNOSIS — E872 Acidosis: Secondary | ICD-10-CM | POA: Diagnosis present

## 2020-05-22 DIAGNOSIS — K2950 Unspecified chronic gastritis without bleeding: Secondary | ICD-10-CM | POA: Diagnosis present

## 2020-05-22 DIAGNOSIS — R110 Nausea: Secondary | ICD-10-CM | POA: Diagnosis present

## 2020-05-22 DIAGNOSIS — K760 Fatty (change of) liver, not elsewhere classified: Secondary | ICD-10-CM | POA: Diagnosis present

## 2020-05-22 DIAGNOSIS — L309 Dermatitis, unspecified: Secondary | ICD-10-CM | POA: Diagnosis present

## 2020-05-22 DIAGNOSIS — G8929 Other chronic pain: Secondary | ICD-10-CM | POA: Diagnosis present

## 2020-05-22 DIAGNOSIS — Z20822 Contact with and (suspected) exposure to covid-19: Secondary | ICD-10-CM | POA: Diagnosis present

## 2020-05-22 DIAGNOSIS — G894 Chronic pain syndrome: Secondary | ICD-10-CM | POA: Diagnosis not present

## 2020-05-22 DIAGNOSIS — R9389 Abnormal findings on diagnostic imaging of other specified body structures: Secondary | ICD-10-CM | POA: Diagnosis present

## 2020-05-22 DIAGNOSIS — R011 Cardiac murmur, unspecified: Secondary | ICD-10-CM | POA: Diagnosis present

## 2020-05-22 DIAGNOSIS — J45909 Unspecified asthma, uncomplicated: Secondary | ICD-10-CM | POA: Diagnosis present

## 2020-05-22 DIAGNOSIS — G2581 Restless legs syndrome: Secondary | ICD-10-CM | POA: Diagnosis present

## 2020-05-22 DIAGNOSIS — E876 Hypokalemia: Secondary | ICD-10-CM | POA: Diagnosis present

## 2020-05-22 DIAGNOSIS — R197 Diarrhea, unspecified: Secondary | ICD-10-CM | POA: Diagnosis not present

## 2020-05-22 DIAGNOSIS — E860 Dehydration: Secondary | ICD-10-CM | POA: Diagnosis present

## 2020-05-22 DIAGNOSIS — Z91040 Latex allergy status: Secondary | ICD-10-CM | POA: Diagnosis not present

## 2020-05-22 DIAGNOSIS — N858 Other specified noninflammatory disorders of uterus: Secondary | ICD-10-CM

## 2020-05-22 DIAGNOSIS — Z85820 Personal history of malignant melanoma of skin: Secondary | ICD-10-CM

## 2020-05-22 DIAGNOSIS — K219 Gastro-esophageal reflux disease without esophagitis: Secondary | ICD-10-CM | POA: Diagnosis present

## 2020-05-22 DIAGNOSIS — Z833 Family history of diabetes mellitus: Secondary | ICD-10-CM | POA: Diagnosis not present

## 2020-05-22 DIAGNOSIS — R066 Hiccough: Secondary | ICD-10-CM | POA: Diagnosis present

## 2020-05-22 DIAGNOSIS — Z825 Family history of asthma and other chronic lower respiratory diseases: Secondary | ICD-10-CM

## 2020-05-22 DIAGNOSIS — D75839 Thrombocytosis, unspecified: Secondary | ICD-10-CM | POA: Diagnosis present

## 2020-05-22 DIAGNOSIS — K52839 Microscopic colitis, unspecified: Secondary | ICD-10-CM | POA: Diagnosis present

## 2020-05-22 DIAGNOSIS — Z888 Allergy status to other drugs, medicaments and biological substances status: Secondary | ICD-10-CM

## 2020-05-22 DIAGNOSIS — F419 Anxiety disorder, unspecified: Secondary | ICD-10-CM | POA: Diagnosis present

## 2020-05-22 DIAGNOSIS — I700 Atherosclerosis of aorta: Secondary | ICD-10-CM | POA: Diagnosis present

## 2020-05-22 DIAGNOSIS — Z7951 Long term (current) use of inhaled steroids: Secondary | ICD-10-CM

## 2020-05-22 LAB — CBC WITH DIFFERENTIAL/PLATELET
Abs Immature Granulocytes: 0.81 10*3/uL — ABNORMAL HIGH (ref 0.00–0.07)
Basophils Absolute: 0.2 10*3/uL — ABNORMAL HIGH (ref 0.0–0.1)
Basophils Relative: 1 %
Eosinophils Absolute: 0.3 10*3/uL (ref 0.0–0.5)
Eosinophils Relative: 1 %
HCT: 37.4 % (ref 36.0–46.0)
Hemoglobin: 12.9 g/dL (ref 12.0–15.0)
Immature Granulocytes: 4 %
Lymphocytes Relative: 18 %
Lymphs Abs: 3.8 10*3/uL (ref 0.7–4.0)
MCH: 32.3 pg (ref 26.0–34.0)
MCHC: 34.5 g/dL (ref 30.0–36.0)
MCV: 93.7 fL (ref 80.0–100.0)
Monocytes Absolute: 1.1 10*3/uL — ABNORMAL HIGH (ref 0.1–1.0)
Monocytes Relative: 5 %
Neutro Abs: 15.4 10*3/uL — ABNORMAL HIGH (ref 1.7–7.7)
Neutrophils Relative %: 71 %
Platelets: 460 10*3/uL — ABNORMAL HIGH (ref 150–400)
RBC: 3.99 MIL/uL (ref 3.87–5.11)
RDW: 14 % (ref 11.5–15.5)
WBC: 21.5 10*3/uL — ABNORMAL HIGH (ref 4.0–10.5)
nRBC: 0 % (ref 0.0–0.2)

## 2020-05-22 LAB — COMPREHENSIVE METABOLIC PANEL
ALT: 7 U/L (ref 0–44)
AST: 13 U/L — ABNORMAL LOW (ref 15–41)
Albumin: 4.4 g/dL (ref 3.5–5.0)
Alkaline Phosphatase: 71 U/L (ref 38–126)
Anion gap: 20 — ABNORMAL HIGH (ref 5–15)
BUN: 92 mg/dL — ABNORMAL HIGH (ref 6–20)
CO2: 16 mmol/L — ABNORMAL LOW (ref 22–32)
Calcium: 10.2 mg/dL (ref 8.9–10.3)
Chloride: 99 mmol/L (ref 98–111)
Creatinine, Ser: 5.47 mg/dL — ABNORMAL HIGH (ref 0.44–1.00)
GFR, Estimated: 8 mL/min — ABNORMAL LOW (ref >60)
Glucose, Bld: 163 mg/dL — ABNORMAL HIGH (ref 70–99)
Potassium: 3.3 mmol/L — ABNORMAL LOW (ref 3.5–5.1)
Sodium: 135 mmol/L (ref 135–145)
Total Bilirubin: 0.5 mg/dL (ref 0.3–1.2)
Total Protein: 9 g/dL — ABNORMAL HIGH (ref 6.5–8.1)

## 2020-05-22 LAB — SARS CORONAVIRUS 2 BY RT PCR (HOSPITAL ORDER, PERFORMED IN ~~LOC~~ HOSPITAL LAB): SARS Coronavirus 2: NEGATIVE

## 2020-05-22 LAB — HIV ANTIBODY (ROUTINE TESTING W REFLEX): HIV Screen 4th Generation wRfx: NONREACTIVE

## 2020-05-22 LAB — LACTIC ACID, PLASMA
Lactic Acid, Venous: 1.5 mmol/L (ref 0.5–1.9)
Lactic Acid, Venous: 1.3 mmol/L (ref 0.5–1.9)

## 2020-05-22 LAB — LIPASE, BLOOD: Lipase: 116 U/L — ABNORMAL HIGH (ref 11–51)

## 2020-05-22 MED ORDER — TRIMETHOBENZAMIDE HCL 100 MG/ML IM SOLN: 200.0000 mg | Freq: Four times a day (QID) | INTRAMUSCULAR | Status: DC | PRN

## 2020-05-22 MED ORDER — ACETAMINOPHEN 325 MG PO TABS
650.0000 mg | ORAL_TABLET | Freq: Four times a day (QID) | ORAL | Status: DC | PRN
  Administered 2020-05-27: 650 mg via ORAL
  Filled 2020-05-22: qty 2

## 2020-05-22 MED ORDER — PROGESTERONE MICRONIZED 100 MG PO CAPS
100.0000 mg | ORAL_CAPSULE | Freq: Every day | ORAL | Status: DC
  Administered 2020-05-22 – 2020-05-27 (×4): 100 mg via ORAL
  Filled 2020-05-22 (×7): qty 1

## 2020-05-22 MED ORDER — POTASSIUM CHLORIDE CRYS ER 20 MEQ PO TBCR
20.0000 meq | EXTENDED_RELEASE_TABLET | ORAL | Status: AC
  Administered 2020-05-22: 20 meq via ORAL
  Filled 2020-05-22: qty 1

## 2020-05-22 MED ORDER — PANTOPRAZOLE SODIUM 40 MG PO TBEC
40.0000 mg | DELAYED_RELEASE_TABLET | Freq: Two times a day (BID) | ORAL | Status: DC
  Administered 2020-05-22 – 2020-05-28 (×12): 40 mg via ORAL
  Filled 2020-05-22 (×12): qty 1

## 2020-05-22 MED ORDER — HEPARIN SODIUM (PORCINE) 5000 UNIT/ML IJ SOLN
5000.0000 [IU] | Freq: Three times a day (TID) | INTRAMUSCULAR | Status: DC
  Administered 2020-05-22 – 2020-05-28 (×16): 5000 [IU] via SUBCUTANEOUS
  Filled 2020-05-22 (×17): qty 1

## 2020-05-22 MED ORDER — METHOCARBAMOL 500 MG PO TABS
750.0000 mg | ORAL_TABLET | Freq: Every day | ORAL | Status: DC | PRN
  Administered 2020-05-24: 750 mg via ORAL
  Filled 2020-05-22: qty 2

## 2020-05-22 MED ORDER — DESMOPRESSIN ACETATE 0.2 MG PO TABS: 0.3000 mg | ORAL_TABLET | Freq: Every day | ORAL | Status: DC

## 2020-05-22 MED ORDER — SODIUM CHLORIDE 0.9 % IV SOLN: INTRAVENOUS | Status: DC

## 2020-05-22 MED ORDER — PROMETHAZINE HCL 25 MG/ML IJ SOLN
12.5000 mg | Freq: Once | INTRAMUSCULAR | Status: AC
  Administered 2020-05-22: 12.5 mg via INTRAVENOUS
  Filled 2020-05-22: qty 1

## 2020-05-22 MED ORDER — SODIUM CHLORIDE 0.9 % IV SOLN
12.5000 mg | Freq: Three times a day (TID) | INTRAVENOUS | Status: DC | PRN
  Filled 2020-05-22: qty 0.5

## 2020-05-22 MED ORDER — STERILE WATER FOR INJECTION IV SOLN
Freq: Once | INTRAVENOUS | Status: AC
  Filled 2020-05-22: qty 850

## 2020-05-22 MED ORDER — LOPERAMIDE HCL 2 MG PO CAPS
2.0000 mg | ORAL_CAPSULE | ORAL | Status: DC | PRN
  Administered 2020-05-24: 2 mg via ORAL
  Filled 2020-05-22: qty 1

## 2020-05-22 MED ORDER — LACTATED RINGERS IV BOLUS
1000.0000 mL | Freq: Once | INTRAVENOUS | Status: AC
  Administered 2020-05-22: 1000 mL via INTRAVENOUS

## 2020-05-22 MED ORDER — HYDRALAZINE HCL 20 MG/ML IJ SOLN: 10.0000 mg | INTRAMUSCULAR | Status: DC | PRN

## 2020-05-22 MED ORDER — MESALAMINE 400 MG PO CPDR
400.0000 mg | DELAYED_RELEASE_CAPSULE | Freq: Two times a day (BID) | ORAL | Status: DC
  Administered 2020-05-22 – 2020-05-28 (×11): 400 mg via ORAL
  Filled 2020-05-22 (×13): qty 1

## 2020-05-22 MED ORDER — ROPINIROLE HCL 1 MG PO TABS
1.0000 mg | ORAL_TABLET | Freq: Every evening | ORAL | Status: DC | PRN
  Administered 2020-05-22 – 2020-05-27 (×9): 1 mg via ORAL
  Filled 2020-05-22 (×15): qty 1

## 2020-05-22 MED ORDER — PREDNISONE 10 MG PO TABS
10.0000 mg | ORAL_TABLET | Freq: Every morning | ORAL | Status: DC
  Administered 2020-05-23 – 2020-05-28 (×6): 10 mg via ORAL
  Filled 2020-05-22 (×6): qty 1

## 2020-05-22 MED ORDER — LOPERAMIDE HCL 2 MG PO CAPS
2.0000 mg | ORAL_CAPSULE | Freq: Once | ORAL | Status: AC
  Administered 2020-05-22: 2 mg via ORAL
  Filled 2020-05-22: qty 1

## 2020-05-22 MED ORDER — ALPRAZOLAM 0.5 MG PO TABS
0.5000 mg | ORAL_TABLET | Freq: Every day | ORAL | Status: DC
  Administered 2020-05-22 – 2020-05-27 (×6): 0.5 mg via ORAL
  Filled 2020-05-22 (×6): qty 1

## 2020-05-22 MED ORDER — SODIUM CHLORIDE 0.45 % IV SOLN
INTRAVENOUS | Status: DC
Start: 2020-05-23 — End: 2020-05-22

## 2020-05-22 MED ORDER — ACETAMINOPHEN 650 MG RE SUPP: 650.0000 mg | Freq: Four times a day (QID) | RECTAL | Status: DC | PRN

## 2020-05-22 MED ORDER — MYCOPHENOLATE MOFETIL 500 MG PO TABS: 1000.0000 mg | ORAL_TABLET | Freq: Two times a day (BID) | ORAL | Status: DC

## 2020-05-22 MED ORDER — HYDROCODONE-ACETAMINOPHEN 10-325 MG PO TABS
1.0000 | ORAL_TABLET | Freq: Every day | ORAL | Status: DC | PRN
  Administered 2020-05-23 – 2020-05-28 (×12): 1 via ORAL
  Filled 2020-05-22 (×12): qty 1

## 2020-05-22 MED ORDER — SODIUM CHLORIDE 0.9 % IV SOLN
12.5000 mg | Freq: Three times a day (TID) | INTRAVENOUS | Status: DC | PRN
  Administered 2020-05-24 – 2020-05-25 (×2): 12.5 mg via INTRAVENOUS
  Filled 2020-05-22 (×4): qty 0.5

## 2020-05-22 MED ORDER — SODIUM CHLORIDE 0.9% FLUSH
3.0000 mL | Freq: Two times a day (BID) | INTRAVENOUS | Status: DC
  Administered 2020-05-22 – 2020-05-28 (×10): 3 mL via INTRAVENOUS

## 2020-05-22 MED ORDER — MYCOPHENOLATE MOFETIL 250 MG PO CAPS
1000.0000 mg | ORAL_CAPSULE | Freq: Two times a day (BID) | ORAL | Status: DC
  Administered 2020-05-22 – 2020-05-28 (×12): 1000 mg via ORAL
  Filled 2020-05-22 (×14): qty 4

## 2020-05-22 MED ORDER — ONDANSETRON HCL 4 MG/2ML IJ SOLN
4.0000 mg | Freq: Once | INTRAMUSCULAR | Status: AC
  Administered 2020-05-22: 4 mg via INTRAVENOUS
  Filled 2020-05-22: qty 2

## 2020-05-22 MED ORDER — ALBUTEROL SULFATE (2.5 MG/3ML) 0.083% IN NEBU: 2.5000 mg | INHALATION_SOLUTION | Freq: Four times a day (QID) | RESPIRATORY_TRACT | Status: DC | PRN

## 2020-05-22 MED ORDER — MOMETASONE FURO-FORMOTEROL FUM 100-5 MCG/ACT IN AERO
2.0000 | INHALATION_SPRAY | Freq: Two times a day (BID) | RESPIRATORY_TRACT | Status: DC
  Administered 2020-05-22 – 2020-05-28 (×10): 2 via RESPIRATORY_TRACT
  Filled 2020-05-22: qty 8.8

## 2020-05-22 MED ORDER — ROSUVASTATIN CALCIUM 5 MG PO TABS
10.0000 mg | ORAL_TABLET | Freq: Every day | ORAL | Status: DC
Start: 2020-05-22 — End: 2020-05-28
  Administered 2020-05-23 – 2020-05-28 (×6): 10 mg via ORAL
  Filled 2020-05-22 (×3): qty 2
  Filled 2020-05-22 (×2): qty 1
  Filled 2020-05-22: qty 2

## 2020-05-22 MED ORDER — HYDROCODONE-ACETAMINOPHEN 5-325 MG PO TABS
2.0000 | ORAL_TABLET | ORAL | Status: AC
  Administered 2020-05-22: 2 via ORAL
  Filled 2020-05-22: qty 2

## 2020-05-22 MED ORDER — PYRIDOSTIGMINE BROMIDE 60 MG PO TABS
30.0000 mg | ORAL_TABLET | Freq: Every day | ORAL | Status: DC
  Administered 2020-05-26 – 2020-05-28 (×3): 30 mg via ORAL
  Filled 2020-05-22 (×7): qty 0.5

## 2020-05-22 MED ORDER — NALOXONE HCL 4 MG/0.1ML NA LIQD: 1.0000 | Freq: Once | NASAL | Status: DC | PRN

## 2020-05-22 MED ORDER — ONDANSETRON HCL 4 MG/2ML IJ SOLN
4.0000 mg | Freq: Four times a day (QID) | INTRAMUSCULAR | Status: DC | PRN
  Administered 2020-05-22 – 2020-05-24 (×7): 4 mg via INTRAVENOUS
  Filled 2020-05-22 (×7): qty 2

## 2020-05-22 MED ORDER — SODIUM CHLORIDE 0.45 % IV SOLN: Freq: Once | INTRAVENOUS | Status: AC

## 2020-05-22 NOTE — Progress Notes (Signed)
Notified by EDP of need for admission. [facility] accepts patient. EDP is to remain responsible for orders/medical decisions while patient is holding at [facility]. Upon arrival to [facility], [place] will assume care. Nursing/Floor staff will call flow manager/carelink to notify them of patient's arrival so that the proper [facility] member may be contacted. Thank you.

## 2020-05-22 NOTE — Plan of Care (Signed)
  Problem: Education: Goal: Knowledge of General Education information will improve Description: Including pain rating scale, medication(s)/side effects and non-pharmacologic comfort measures Outcome: Progressing   Problem: Health Behavior/Discharge Planning: Goal: Ability to manage health-related needs will improve Outcome: Progressing   Problem: Clinical Measurements: Goal: Ability to maintain clinical measurements within normal limits will improve Outcome: Progressing Goal: Will remain free from infection Outcome: Progressing Goal: Diagnostic test results will improve Outcome: Progressing Goal: Respiratory complications will improve Outcome: Progressing Goal: Cardiovascular complication will be avoided Outcome: Progressing   Problem: Elimination: Goal: Will not experience complications related to bowel motility Outcome: Progressing Goal: Will not experience complications related to urinary retention Outcome: Progressing   Problem: Pain Managment: Goal: General experience of comfort will improve Outcome: Progressing

## 2020-05-22 NOTE — ED Notes (Signed)
Spoke with Teresa Lee at [facility] regarding consult with hospitalist

## 2020-05-22 NOTE — ED Notes (Signed)
Patient transported to CT 

## 2020-05-22 NOTE — H&P (Addendum)
History and Physical    Teresa Lee ZHG:[id number] DOB: 18-Mar-1961 DOA: 05/22/2020  Referring MD/NP/PA: Cherylann Ratel, MD PCP: [name], MD  Patient coming from: [facility] transfer  Chief Complaint: Nausea and diarrhea  I have personally briefly reviewed patient's old medical records in [place]   HPI: Teresa Lee is a 60 y.o. female with medical history significant of HTN, HLD, myasthenia gravis, fibromyalgia, asthma, restless leg syndrome, and Crohn's disease who presents with complaints of nausea and diarrhea over the last 2 weeks.  Most of history is obtained from the patient's husband who is present at bedside at her request.  Patient had L4-5 TLIF and right micro discectomy of L5-S1 on 2/15 with Dr. [name] of neurosurgery.  Patient stayed hospitalized for 3 days following surgery and was discharged home.  She has been taking hydrocodone-acetaminophen 10 mg-325 mg up to 5 per daily for back pain.  Over the last 2 weeks patient has complained of persistent nausea and has had no appetite.  She reports everything taste and smells horrible.  Patient has been taking Zofran almost around-the-clock.  Denies any vomiting, but she has had dry heaves.  Initially following the surgery she had been constipated, but after taking every laxatives and enemas she was finally able to have a bowel movement 7 days ago.  However, since that time patient has had persistent diarrhea.  Associated symptoms include possibly 12 pound weight loss, decreased urine output, intermittent hot/cold spells, constant  hiccups, issues with her memory, intermittently disoriented at times, and using a walker to get around when previously she could ambulate without assistance.    ED Course: On admission to the emergency department patient was seen to be afebrile relatively stable vital signs.  Labs significant for WBC 21.5, platelets 460, potassium 3.3, CO2 16, BUN 92, creatinine 5.47, anion gap 20, and lipase  116.  COVID-19 screening was negative.  CT scan of the abdomen and pelvis only noted of the heterogeneity of the central uterus which may represent endometrial thickening/mass.  Patient had been ordered 2 L of normal saline IV fluids, Imodium, and antiemetics.   Review of Systems  Constitutional: Positive for malaise/fatigue and weight loss.       Subjective hot and cold flashes  HENT: Negative for congestion and nosebleeds.   Eyes: Negative for photophobia and pain.  Respiratory: Negative for cough and shortness of breath.   Cardiovascular: Negative for chest pain.  Gastrointestinal: Positive for nausea. Negative for vomiting.  Genitourinary:       Positive for decreased urine output  Musculoskeletal: Positive for back pain.  Skin: Negative for rash.  Neurological: Positive for weakness.  All other systems reviewed and are negative.   Past Medical History:  Diagnosis Date  . Arthritis    osteoarthritis  . Asthma   . Blind left eye   . Cancer ([place])    skin  . Crohn's disease ([place])   . CTS (carpal tunnel syndrome)   . DJD (degenerative joint disease)    Both cervical spine and LS spine  . Fibromyalgia   . GERD (gastroesophageal reflux disease)   . Heart murmur   . Herniated nucleus pulposus   . Hyperlipidemia   . Hypertension   . IBS (irritable bowel syndrome)   . Leukocytosis   . Myasthenia gravis   . Myasthenia gravis ([place])   . Myasthenia gravis ([place])   . Pneumonia   . Spinal cord stimulator status     Past Surgical  History:  Procedure Laterality Date  . BREAST REDUCTION SURGERY    . CARPAL TUNNEL RELEASE     bilateral  . cervical disc inj    . CHOLECYSTECTOMY    . ENDOMETRIAL ABLATION W/ NOVASURE    . LUMBAR LAMINECTOMY/DECOMPRESSION MICRODISCECTOMY Right 05/04/2020   Procedure: Right Lumbar Five Sacral One Microdiscectomy;  Surgeon: Erline Levine, MD;  Location: [facility];  Service: Neurosurgery;  Laterality: Right;  posterior  . MELANOMA EXCISION    . NASAL  SINUS SURGERY    . SPINAL CORD STIMULATOR INSERTION  2019  . THYMECTOMY     due to myastenia gravis  . THYMECTOMY    . TRANSFORAMINAL LUMBAR INTERBODY FUSION (TLIF) WITH PEDICLE SCREW FIXATION 1 LEVEL Right 05/04/2020   Procedure: Right Lumbar Four-Five Transforaminal lumbar interbody fusion;  Surgeon: Erline Levine, MD;  Location: [facility];  Service: Neurosurgery;  Laterality: Right;  posterior     reports that she has never smoked. She has never used smokeless tobacco. She reports current alcohol use. She reports that she does not use drugs.  Allergies  Allergen Reactions  . Latex Other (See Comments)    BLISTER  . Lorazepam     Hallucinations  . Orange Concentrate [Flavoring Agent]     Acid reflux  . Other Hives, Nausea Only and Other (See Comments)    Some nuts cause a rash  . Percocet [Oxycodone-Acetaminophen] Hives  . Sulfa Antibiotics Hives  . Tomato     Burns skin  . Bioflavonoids Rash    Causes burning of skin and blisters  . Cefixime     Due to myasthenia gravis  . Nucynta [Tapentadol]     Headache     Family History  Problem Relation Age of Onset  . Asthma Daughter   . Heart disease Mother   . Cancer Mother   . Hyperlipidemia Mother   . Hypertension Mother   . Diabetes Father     Prior to Admission medications   Medication Sig Start Date End Date Taking? Authorizing Provider  albuterol (PROVENTIL) (2.5 MG/3ML) 0.083% nebulizer solution Take 2.5 mg by nebulization every 6 (six) hours as needed for shortness of breath. 12/26/19   [provider]  albuterol (VENTOLIN HFA) 108 (90 Base) MCG/ACT inhaler 1 PUFF EVERY 6 HOURS AS NEEDED FOR WHEEZING Patient taking differently: Inhale 1 puff into the lungs every 6 (six) hours as needed for wheezing or shortness of breath. 09/05/19   Martinique, Betty G, MD  ALPRAZolam Duanne Moron) 0.5 MG tablet Take 0.5 mg by mouth at bedtime.    [provider]  cyanocobalamin (,VITAMIN B-12,) 1000 MCG/ML injection Inject 1,000  mcg into the muscle every 30 (thirty) days.    [provider]  desmopressin (DDAVP) 0.1 MG tablet Take 0.3 mg by mouth at bedtime.    [provider]  estradiol (ESTRACE) 2 MG tablet Take 4 mg by mouth at bedtime.    [provider]  HYDROcodone-acetaminophen (NORCO) 10-325 MG tablet Take 1 tablet by mouth 5 (five) times daily as needed for moderate pain. 08/25/17   [provider]  losartan (COZAAR) 50 MG tablet TAKE 1 TABLET DAILY Patient taking differently: Take 50 mg by mouth daily. 01/26/20   [place], Betty G, MD  Mesalamine (ASACOL) 400 MG CPDR DR capsule Take 800 mg by mouth 2 (two) times daily. 12/10/18   [provider]  methocarbamol (ROBAXIN) 750 MG tablet Take 750 mg by mouth at bedtime. 02/07/19   [provider]  mycophenolate (CELLCEPT) 500 MG tablet Take 1,000 mg by mouth 2 (two) times daily.     [provider]  omega-3 acid ethyl esters (LOVAZA) 1 g capsule Take 1 capsule (1 g total) by mouth 2 (two) times daily. 10/15/19   [place], Betty G, MD  ondansetron (ZOFRAN) 4 MG tablet Take 1 tablet (4 mg total) by mouth every 8 (eight) hours as needed for nausea or vomiting. 02/25/19   Elodia Florence., MD  pantoprazole (PROTONIX) 40 MG tablet TAKE 1 TABLET DAILY Patient taking differently: Take 40 mg by mouth 2 (two) times daily. 10/15/19   [place], Betty G, MD  potassium chloride SA (KLOR-CON) 20 MEQ tablet TAKE 1 TABLET DAILY Patient taking differently: Take 20 mEq by mouth 2 (two) times daily. 01/16/20   [place], Betty G, MD  predniSONE (DELTASONE) 10 MG tablet Take 10 mg by mouth daily.    [provider]  progesterone (PROMETRIUM) 100 MG capsule Take 100 mg by mouth at bedtime.    [provider]  pyridostigmine (MESTINON) 60 MG tablet Take 30 mg by mouth daily. 12/27/17   [provider]  rOPINIRole (REQUIP) 1 MG tablet Take 1-2 tablets (1-2 mg total) by mouth at bedtime. 08/08/19   [place], Betty  G, MD  rosuvastatin (CRESTOR) 40 MG tablet Take 1 tablet (40 mg total) by mouth daily. 02/02/19   [place], Betty G, MD  Spacer/Aero-Holding Chambers (AEROCHAMBER PLUS) inhaler Use as instructed to use with inahaler. 08/30/18   [place], Betty G, MD  SYMBICORT 80-4.5 MCG/ACT inhaler Inhale 2 puffs into the lungs 2 (two) times daily. Patient taking differently: Inhale 2 puffs into the lungs 2 (two) times daily as needed (shortness of breath). 09/05/19   [place], Betty G, MD  triamcinolone (KENALOG) 0.1 % Apply 1 application topically 2 (two) times daily.    [provider]  triamterene-hydrochlorothiazide (DYAZIDE) 37.5-25 MG capsule TAKE (1) CAPSULE DAILY IN THE MORNING. Patient taking differently: Take 1 capsule by mouth daily. 04/25/19   [place], Betty G, MD  Vitamin D, Ergocalciferol, (DRISDOL) 1.25 MG (50000 UNIT) CAPS capsule 1 CAPSULE EVERY 2 WEEKS Patient taking differently: Take 50,000 Units by mouth every 14 (fourteen) days. 08/08/19   [place], Betty G, MD    Physical Exam:  Constitutional: Older female who appears to be in some discomfort laying on her right side in the hospital bed Vitals:   05/22/20 0848 05/22/20 1314 05/22/20 1437 05/22/20 1440  BP: (!) 144/81 139/68  (!) 150/75  Pulse: 86 94  91  Resp: 16 18    Temp:    98.9 F (37.2 C)  TempSrc:    Oral  SpO2: 100% 98%  100%  Weight:   66.3 kg   Height:   5' (1.524 m)    Eyes: PERRL, lids and conjunctivae normal ENMT: Mucous membranes are dry.  Neck: normal, supple, no masses, no thyromegaly Respiratory: clear to auscultation bilaterally, no wheezing, no crackles. Normal respiratory effort. No accessory muscle use.  Cardiovascular: Regular rate and rhythm, no murmurs / rubs / gallops. No extremity edema. 2+ pedal pulses. No carotid bruits.  Abdomen: no tenderness, no masses palpated. No hepatosplenomegaly. Bowel sounds positive.  Musculoskeletal: no clubbing / cyanosis. No joint deformity upper and lower extremities. Good  ROM, no contractures. Normal muscle tone.  Skin: Postsurgical lumbar incision appears to be healing well with no significant erythema or purulent discharge appreciated Neurologic: CN 2-12 grossly intact. Sensation intact, DTR normal. Strength 5/5 in all 4.  Psychiatric: Normal judgment and insight. Alert and oriented x 3. Normal mood.     Labs on Admission: I have personally reviewed following labs and imaging studies  CBC: Recent Labs  Lab 05/22/20 0800  WBC 21.5*  NEUTROABS 15.4*  HGB 12.9  HCT 37.4  MCV 93.7  PLT 563*   Basic Metabolic Panel: Recent Labs  Lab 05/22/20 0800  NA 135  K 3.3*  CL 99  CO2 16*  GLUCOSE 163*  BUN 92*  CREATININE 5.47*  CALCIUM 10.2   GFR: Estimated Creatinine Clearance: 9.5 mL/min (A) (by C-G formula based on SCr of 5.47 mg/dL (H)). Liver Function Tests: Recent Labs  Lab 05/22/20 0800  AST 13*  ALT 7  ALKPHOS 71  BILITOT 0.5  PROT 9.0*  ALBUMIN 4.4   Recent Labs  Lab 05/22/20 0800  LIPASE 116*   No results for input(s): AMMONIA in the last 168 hours. Coagulation Profile: No results for input(s): INR, PROTIME in the last 168 hours. Cardiac Enzymes: No results for input(s): CKTOTAL, CKMB, CKMBINDEX, TROPONINI in the last 168 hours. BNP (last 3 results) No results for input(s): PROBNP in the last 8760 hours. HbA1C: No results for input(s): HGBA1C in the last 72 hours. CBG: No results for input(s): GLUCAP in the last 168 hours. Lipid Profile: No results for input(s): CHOL, HDL, LDLCALC, TRIG, CHOLHDL, LDLDIRECT in the last 72 hours. Thyroid Function Tests: No results for input(s): TSH, T4TOTAL, FREET4, T3FREE, THYROIDAB in the last 72 hours. Anemia Panel: No results for input(s): VITAMINB12, FOLATE, FERRITIN, TIBC, IRON, RETICCTPCT in the last 72 hours. Urine analysis:    Component Value Date/Time   COLORURINE AMBER (A) 02/17/2019 0305   APPEARANCEUR CLOUDY (A) 02/17/2019 0305   LABSPEC >1.030 (H) 02/17/2019 0305    PHURINE 5.5 02/17/2019 0305   GLUCOSEU NEGATIVE 02/17/2019 0305   HGBUR MODERATE (A) 02/17/2019 0305   BILIRUBINUR SMALL (A) 02/17/2019 0305   KETONESUR NEGATIVE 02/17/2019 0305   PROTEINUR 100 (A) 02/17/2019 0305   NITRITE NEGATIVE 02/17/2019 0305   LEUKOCYTESUR NEGATIVE 02/17/2019 0305   Sepsis Labs: Recent Results (from the past 240 hour(s))  SARS Coronavirus 2 by RT PCR (hospital order, performed in [place] hospital lab)     Status: None   Collection Time: 05/22/20  9:50 AM  Result Value Ref Range Status   SARS Coronavirus 2 NEGATIVE NEGATIVE Final    Comment: (NOTE) SARS-CoV-2 target nucleic acids are NOT DETECTED.  The SARS-CoV-2 RNA is generally detectable in upper and lower respiratory specimens during the acute phase of infection. The lowest concentration of SARS-CoV-2 viral copies this assay can detect is 250 copies / mL. A negative result does not preclude SARS-CoV-2 infection and should not be used as the sole basis for treatment or other patient management decisions.  A negative result may occur with improper specimen collection / handling, submission of specimen other than nasopharyngeal swab, presence of viral mutation(s) within the areas targeted by this assay, and inadequate number of viral copies (<250 copies / mL). A negative result must be combined with clinical observations, patient history, and epidemiological information.  Fact Sheet for Patients:   StrictlyIdeas.no  Fact Sheet for Healthcare Providers: BankingDealers.co.za  This test is not yet approved or  cleared by the [place] FDA and has been authorized for detection and/or diagnosis of SARS-CoV-2 by FDA under an Emergency Use Authorization (EUA).  This EUA will remain in effect (meaning this test can be used) for the duration of the COVID-19  declaration under Section 564(b)(1) of the Act, 21 U.S.C. section 360bbb-3(b)(1), unless the  authorization is terminated or revoked sooner.  Performed at [facility], [address] [place], [name] [address]      Radiological Exams on Admission: CT ABDOMEN PELVIS WO CONTRAST  Result Date: 05/22/2020 CLINICAL DATA:  60 year old female with abdominal and pelvic pain with nausea and vomiting for 2 weeks. History of lumbar surgery on 05/04/2020. History of inflammatory bowel disease. EXAM: CT ABDOMEN AND PELVIS WITHOUT CONTRAST TECHNIQUE: Multidetector CT imaging of the abdomen and pelvis was performed following the standard protocol without IV contrast. COMPARISON:  09/05/2017 CT FINDINGS: Please note that parenchymal abnormalities may be missed without intravenous contrast. Lower chest: No acute abnormality Hepatobiliary: Hepatic steatosis again identified without focal hepatic abnormality. The patient is status post cholecystectomy. No biliary dilatation. Pancreas: Unremarkable Spleen: Unremarkable Adrenals/Urinary Tract: The kidneys, adrenal glands and bladder are unremarkable. There is no evidence of hydronephrosis or urinary calculi. Stomach/Bowel: Stomach is within normal limits. Appendix appears normal. No evidence of bowel wall thickening, distention, or inflammatory changes. Vascular/Lymphatic: Aortic atherosclerosis. No enlarged abdominal or pelvic lymph nodes. Reproductive: There is heterogeneity of the central uterus noted which may represent endometrial thickening/mass. The adnexal regions are unremarkable. Other: No ascites, focal collection or pneumoperitoneum. Musculoskeletal: Posterior fusion changes at L4-L5 noted. A thoracic cord stimulator is identified. No acute or suspicious bony findings are noted. IMPRESSION: 1. No evidence of acute abnormality. No bowel abnormalities identified. 2. Heterogeneity of the central uterus which may represent endometrial thickening/mass. Pelvic ultrasound is recommended for further evaluation. 3. Hepatic steatosis. 4. Aortic  Atherosclerosis (ICD10-I70.0). Electronically Signed   By: Margarette Canada M.D.   On: 05/22/2020 10:13    EKG: Independently reviewed.  Normal sinus rhythm at 89 bpm with QTC 435  Assessment/Plan Nausea and diarrhea: Acute.  Patient has had persistent nausea with hiccups.  She initially was constipated after her lumbar surgery on 2/15, but over the last week has had persistent diarrhea after using the laxatives and enemas.  Suspected medication induced diarrhea.  Persistent nausea may be related with worsening kidney function. -Admit to a medical telemetry  -Advance diet as tolerated -Hold laxatives for now -Antiemetics as needed -Compazine as needed for hiccups or refractory nausea vomiting -Imodium as needed for diarrhea  Acute renal failure with uremia:Creatinine noted to be 5.47 with BUN 92.  Baseline creatinine previously noted to be 0.95 on 04/30/2020.  CT scan of the abdomen and pelvis did not note any signs of obstruction.  She has been given 2 L of IV fluids at [facility].  Patient's elevated BUN may be the cause of patient's persistent nausea.  Patient is acute renal failure is likely multifactorial in the setting of poor p.o. intake, diarrhea, and diuretics. -Strict intake and output -Hold nephrotoxic agents -0.45% normal saline IV fluids and sodium bicarb drip at 75 mL/h each for 1 L -Then continue 0.45% normal saline IV fluids 150 mL/h as tolerated -Daily monitoring of kidney function and electrolytes  Leukocytosis and thrombocytosis: Acute.  On admission WBC 21.5 with platelet count 460.  Suspect these are likely reactive in nature  -Check urinalysis when able -Consider checking stool studies if diarrhea persist  Metabolic acidosis with elevated anion gap: Acute on admission CO2 was noted to be 16 with initial anion gap of 20.  Suspecting either uremia versus lactic acidosis secondary to dehydration as likely cause of symptoms. -Continue to monitor  Hypokalemia: Acute.  Initial potassium 3.3 on admission. -Give 20 mEq of potassium chloride x1 dose and will not schedule daily given acute renal failure -Continue to monitor and replace as needed  Essential hypertension: Blood pressures currently stable 120/74-150/75.  Home blood pressure medications include losartan 50 mg daily, triamterene-hydrochlorothiazide 37.5-25 mg every morning -Hold BP meds due to acute renal failure -Hydralazine as needed for elevated systolic blood pressures greater than 180  Myasthenia gravis: Patient appears currently stable. -Monitor NIFs every 12 hours -Continue pyridostigmine and CellCept -Continue steroids  Chronic pain/fibromyalgia -Continue current pain regimen of hydrocodone as needed  Abnormality of the uterus: Incidental finding of CT imaging noted to heterogenicity of the central uterus that may represent endometrial thickening/mass. -Pelvic ultrasound recommended for further evaluation once patient's acute issues are improved  Recent lumbar surgery:L4-5 TLIF and right micro discectomy of L5-S1 on 2/15 with Dr. [name] of neurosurgery.  Surgical wound appears to be healing well.  She is on hydrocodone 10-325 as needed for up to 5 times per day. -Continue outpatient follow-up with Dr. [name]  Crohn's disease: CT imaging did not note any acute signs of inflammation to suggest a Crohn's flare for -Continue mesalamine as tolerated  Asthma: On physical exam lungs sound clear without acute flare. -Continue pharmacy substitution for Symbicort and albuterol nebs as needed for shortness of breath or wheezing  Anxiety -Continue Xanax as needed  Restless leg syndrome -Continue Requip  GERD -Continue Protonix  DVT prophylaxis: Heparin Code Status: Full Family Communication: Husband updated at bedside Disposition Plan: Please discharge home once medically Consults called: None Admission status: Inpatient, require more than 2 midnight stay due to acute renal failure and  need of aggressive IV fluid rehydration  Norval Morton MD [facility]   If 7PM-7AM, please contact night-coverage   05/22/2020, 2:49 PM

## 2020-05-22 NOTE — ED Provider Notes (Signed)
[facility] Provider Note   CSN: [id number] Arrival date & time: 05/22/20  3903     History Chief Complaint  Patient presents with  . Dehydration    [facility] is a 60 y.o. female.  HPI Patient presents with nausea vomiting diarrhea for around 2 weeks.  Around 3 weeks ago had lumbar surgery.  Is on chronic pain medicines for this.  States that after the surgery became constipated and took more of a bowel regimen and ended up with diarrhea.  Since then has had diarrhea and has had to use some medicines to help decrease diarrhea.  The last day or 2 is developed nausea and vomiting.  Feels weak all over.  Has had generalized weakness has been having to use a walker.  Has some dull abdominal cramping.  Has decreased appetite and states really cannot eat anything.  Has a history of Crohn's disease but states this really does not feel like her Crohn's.  No fevers or chills.  Continues her medicines.    Past Medical History:  Diagnosis Date  . Arthritis    osteoarthritis  . Asthma   . Blind left eye   . Cancer ([place])    skin  . Crohn's disease ([place])   . CTS (carpal tunnel syndrome)   . DJD (degenerative joint disease)    Both cervical spine and LS spine  . Fibromyalgia   . GERD (gastroesophageal reflux disease)   . Heart murmur   . Herniated nucleus pulposus   . Hyperlipidemia   . Hypertension   . IBS (irritable bowel syndrome)   . Leukocytosis   . Myasthenia gravis   . Myasthenia gravis ([place])   . Myasthenia gravis ([place])   . Pneumonia   . Spinal cord stimulator status     Patient Active Problem List   Diagnosis Date Noted  . ARF (acute renal failure) ([place]) 05/22/2020  . Diarrhea 05/22/2020  . Nausea 05/22/2020  . Spondylolisthesis of lumbar region 05/04/2020  . Chronic pain disorder 08/12/2019  . COVID-19   . Acute on chronic renal failure ([place]) 02/16/2019  . Insomnia disorder 02/27/2018  . Fatigue 02/27/2018  . Fibromyalgia  syndrome 04/30/2017  . RLS (restless legs syndrome) 12/26/2016  . B12 deficiency 12/26/2016  . Vitamin D deficiency, unspecified 12/26/2016  . GERD (gastroesophageal reflux disease) 12/26/2016  . Anxiety disorder 12/26/2016  . Hyponatremia 10/24/2016  . AKI (acute kidney injury) ([place]) 10/23/2016  . Hypomagnesemia 10/22/2016  . Hypophosphatemia 10/22/2016  . Hypokalemia 10/21/2016  . Wound infection after surgery 10/21/2016  . Sepsis ([place]) 10/21/2016  . Myasthenia gravis ([place]) 10/21/2016  . Melanoma ([place]) 10/21/2016  . Crohn disease ([place]) 10/21/2016  . Cellulitis 10/21/2016  . Palpitations 08/25/2013  . Leukocytosis 05/25/2011  . Hyperlipidemia 04/20/2007  . Essential hypertension 04/20/2007  . Asthma 04/20/2007  . CARPAL TUNNEL SYNDROME, HX OF 04/20/2007    Past Surgical History:  Procedure Laterality Date  . BREAST REDUCTION SURGERY    . CARPAL TUNNEL RELEASE     bilateral  . cervical disc inj    . CHOLECYSTECTOMY    . ENDOMETRIAL ABLATION W/ NOVASURE    . LUMBAR LAMINECTOMY/DECOMPRESSION MICRODISCECTOMY Right 05/04/2020   Procedure: Right Lumbar Five Sacral One Microdiscectomy;  Surgeon: Erline Levine, MD;  Location: [facility];  Service: Neurosurgery;  Laterality: Right;  posterior  . MELANOMA EXCISION    . NASAL SINUS SURGERY    . SPINAL CORD STIMULATOR INSERTION  2019  . THYMECTOMY  due to myastenia gravis  . THYMECTOMY    . TRANSFORAMINAL LUMBAR INTERBODY FUSION (TLIF) WITH PEDICLE SCREW FIXATION 1 LEVEL Right 05/04/2020   Procedure: Right Lumbar Four-Five Transforaminal lumbar interbody fusion;  Surgeon: Erline Levine, MD;  Location: [facility];  Service: Neurosurgery;  Laterality: Right;  posterior     OB History   No obstetric history on file.     Family History  Problem Relation Age of Onset  . Asthma Daughter   . Heart disease Mother   . Cancer Mother   . Hyperlipidemia Mother   . Hypertension Mother   . Diabetes Father     Social History   Tobacco Use   . Smoking status: Never Smoker  . Smokeless tobacco: Never Used  Vaping Use  . Vaping Use: Never used  Substance Use Topics  . Alcohol use: Yes    Comment: occassional wine  . Drug use: No    Home Medications Prior to Admission medications   Medication Sig Start Date End Date Taking? Authorizing Provider  albuterol (PROVENTIL) (2.5 MG/3ML) 0.083% nebulizer solution Take 2.5 mg by nebulization every 6 (six) hours as needed for shortness of breath. 12/26/19  Yes [provider]  albuterol (VENTOLIN HFA) 108 (90 Base) MCG/ACT inhaler 1 PUFF EVERY 6 HOURS AS NEEDED FOR WHEEZING Patient taking differently: Inhale 1 puff into the lungs every 6 (six) hours as needed for wheezing or shortness of breath. 09/05/19  Yes [place], Betty G, MD  ALPRAZolam Duanne Moron) 0.5 MG tablet Take 0.5 mg by mouth at bedtime.   Yes [provider]  desmopressin (DDAVP) 0.1 MG tablet Take 0.3 mg by mouth at bedtime.   Yes [provider]  estradiol (ESTRACE) 2 MG tablet Take 2 mg by mouth at bedtime.   Yes [provider]  HYDROcodone-acetaminophen (NORCO) 10-325 MG tablet Take 1 tablet by mouth 5 (five) times daily as needed for moderate pain. 08/25/17  Yes [provider]  losartan (COZAAR) 50 MG tablet TAKE 1 TABLET DAILY Patient taking differently: Take 50 mg by mouth every morning. 01/26/20  Yes [place], Betty G, MD  Mesalamine (ASACOL) 400 MG CPDR DR capsule Take 400 mg by mouth 2 (two) times daily. 12/10/18  Yes [provider]  methocarbamol (ROBAXIN) 750 MG tablet Take 750 mg by mouth at bedtime. 02/07/19  Yes [provider]  mycophenolate (CELLCEPT) 500 MG tablet Take 1,000 mg by mouth 2 (two) times daily.    Yes [provider]  naloxone (NARCAN) nasal spray 4 mg/0.1 mL Place 1 spray into the nose once as needed (opioid overdose).   Yes [provider]  omega-3 acid ethyl esters (LOVAZA) 1 g capsule Take 1 capsule (1 g total) by mouth 2  (two) times daily. Patient taking differently: Take 1 g by mouth daily. 10/15/19  Yes [place], Betty G, MD  ondansetron (ZOFRAN) 4 MG tablet Take 1 tablet (4 mg total) by mouth every 8 (eight) hours as needed for nausea or vomiting. 02/25/19  Yes Elodia Florence., MD  pantoprazole (PROTONIX) 40 MG tablet TAKE 1 TABLET DAILY Patient taking differently: Take 40 mg by mouth 2 (two) times daily. 10/15/19  Yes [place], Betty G, MD  potassium chloride SA (KLOR-CON) 20 MEQ tablet TAKE 1 TABLET DAILY Patient taking differently: Take 20 mEq by mouth daily. 01/16/20  Yes [place], Betty G, MD  predniSONE (DELTASONE) 10 MG tablet Take 10 mg by mouth every morning.   Yes [provider]  progesterone (  PROMETRIUM) 100 MG capsule Take 100 mg by mouth at bedtime.   Yes [provider]  rOPINIRole (REQUIP) 1 MG tablet Take 1-2 tablets (1-2 mg total) by mouth at bedtime. Patient taking differently: Take 1 mg by mouth See admin instructions. Take 1 tablet (1 mg) by mouth daily at bedtime, may take a 2nd tablet (1 mg) later if still needed for restless legs 08/08/19  Yes [place], Betty G, MD  rosuvastatin (CRESTOR) 40 MG tablet Take 1 tablet (40 mg total) by mouth daily. 02/02/19  Yes [place], Betty G, MD  SYMBICORT 80-4.5 MCG/ACT inhaler Inhale 2 puffs into the lungs 2 (two) times daily. Patient taking differently: Inhale 2 puffs into the lungs 2 (two) times daily as needed (shortness of breath). 09/05/19  Yes [place], Betty G, MD  triamterene-hydrochlorothiazide (DYAZIDE) 37.5-25 MG capsule TAKE (1) CAPSULE DAILY IN THE MORNING. Patient taking differently: Take 1 capsule by mouth every morning. 04/25/19  Yes [place], Betty G, MD  Vitamin D, Ergocalciferol, (DRISDOL) 1.25 MG (50000 UNIT) CAPS capsule 1 CAPSULE EVERY 2 WEEKS Patient taking differently: Take 50,000 Units by mouth See admin instructions. Take one capsule (50,000 units) by mouth every other Sunday 08/08/19  Yes [place], Betty G, MD   cyanocobalamin (,VITAMIN B-12,) 1000 MCG/ML injection Inject 1,000 mcg into the muscle every 30 (thirty) days.    [provider]  pyridostigmine (MESTINON) 60 MG tablet Take 30 mg by mouth daily. 12/27/17   [provider]  Spacer/Aero-Holding Chambers (AEROCHAMBER PLUS) inhaler Use as instructed to use with inahaler. 08/30/18   [place], Betty G, MD    Allergies    Duloxetine, Latex, Lorazepam, Orange concentrate [flavoring agent], Other, Percocet [oxycodone-acetaminophen], Sulfa antibiotics, Bioflavonoids, Cefixime, and Nucynta [tapentadol]  Review of Systems   Review of Systems  Constitutional: Positive for appetite change and fatigue.  HENT: Negative for congestion.   Respiratory: Negative for shortness of breath.   Gastrointestinal: Positive for abdominal pain, diarrhea, nausea and vomiting.  Genitourinary: Negative for flank pain.  Musculoskeletal: Positive for back pain.  Skin: Negative for rash.  Neurological: Positive for weakness.  Psychiatric/Behavioral: Negative for confusion.    Physical Exam Updated Vital Signs BP (!) 153/89 (BP Location: Left Arm)   Pulse 84   Temp 98.6 F (37 C) (Oral)   Resp 19   Ht 5' (1.524 m)   Wt 66.9 kg   SpO2 97%   BMI 28.80 kg/m   Physical Exam Vitals and nursing note reviewed.  HENT:     Head: Atraumatic.     Right Ear: External ear normal.     Left Ear: External ear normal.  Eyes:     General: No scleral icterus. Cardiovascular:     Heart sounds: Normal heart sounds.  Pulmonary:     Breath sounds: No wheezing or rhonchi.  Abdominal:     Tenderness: There is abdominal tenderness.     Comments: Upper abdominal tenderness without rebound or guarding.  Musculoskeletal:        General: No tenderness.     Cervical back: Neck supple.  Skin:    General: Skin is warm.     Capillary Refill: Capillary refill takes less than 2 seconds.  Neurological:     Mental Status: She is alert and oriented to person, place,  and time.     ED Results / Procedures / Treatments   Labs (all labs ordered are listed, but only abnormal results are displayed) Labs Reviewed  LIPASE, BLOOD - Abnormal; Notable for  the following components:      Result Value   Lipase 116 (*)    All other components within normal limits  COMPREHENSIVE METABOLIC PANEL - Abnormal; Notable for the following components:   Potassium 3.3 (*)    CO2 16 (*)    Glucose, Bld 163 (*)    BUN 92 (*)    Creatinine, Ser 5.47 (*)    Total Protein 9.0 (*)    AST 13 (*)    GFR, Estimated 8 (*)    Anion gap 20 (*)    All other components within normal limits  CBC WITH DIFFERENTIAL/PLATELET - Abnormal; Notable for the following components:   WBC 21.5 (*)    Platelets 460 (*)    Neutro Abs 15.4 (*)    Monocytes Absolute 1.1 (*)    Basophils Absolute 0.2 (*)    Abs Immature Granulocytes 0.81 (*)    All other components within normal limits  BASIC METABOLIC PANEL - Abnormal; Notable for the following components:   Potassium 2.3 (*)    CO2 19 (*)    Glucose, Bld 103 (*)    BUN 67 (*)    Creatinine, Ser 4.16 (*)    Calcium 8.5 (*)    GFR, Estimated 12 (*)    Anion gap 16 (*)    All other components within normal limits  CBC - Abnormal; Notable for the following components:   WBC 14.9 (*)    RBC 3.08 (*)    Hemoglobin 10.0 (*)    HCT 27.9 (*)    All other components within normal limits  MAGNESIUM - Abnormal; Notable for the following components:   Magnesium 1.1 (*)    All other components within normal limits  PHOSPHORUS - Abnormal; Notable for the following components:   Phosphorus 5.2 (*)    All other components within normal limits  POTASSIUM - Abnormal; Notable for the following components:   Potassium 2.3 (*)    All other components within normal limits  SARS CORONAVIRUS 2 (HOSPITAL ORDER, [facility])  GASTROINTESTINAL PANEL BY PCR, STOOL (REPLACES STOOL CULTURE)  C DIFFICILE QUICK SCREEN W PCR  REFLEX  HIV ANTIBODY (ROUTINE TESTING W REFLEX)  LACTIC ACID, PLASMA  LACTIC ACID, PLASMA    EKG None  Radiology CT ABDOMEN PELVIS WO CONTRAST  Result Date: 05/22/2020 CLINICAL DATA:  60 year old female with abdominal and pelvic pain with nausea and vomiting for 2 weeks. History of lumbar surgery on 05/04/2020. History of inflammatory bowel disease. EXAM: CT ABDOMEN AND PELVIS WITHOUT CONTRAST TECHNIQUE: Multidetector CT imaging of the abdomen and pelvis was performed following the standard protocol without IV contrast. COMPARISON:  09/05/2017 CT FINDINGS: Please note that parenchymal abnormalities may be missed without intravenous contrast. Lower chest: No acute abnormality Hepatobiliary: Hepatic steatosis again identified without focal hepatic abnormality. The patient is status post cholecystectomy. No biliary dilatation. Pancreas: Unremarkable Spleen: Unremarkable Adrenals/Urinary Tract: The kidneys, adrenal glands and bladder are unremarkable. There is no evidence of hydronephrosis or urinary calculi. Stomach/Bowel: Stomach is within normal limits. Appendix appears normal. No evidence of bowel wall thickening, distention, or inflammatory changes. Vascular/Lymphatic: Aortic atherosclerosis. No enlarged abdominal or pelvic lymph nodes. Reproductive: There is heterogeneity of the central uterus noted which may represent endometrial thickening/mass. The adnexal regions are unremarkable. Other: No ascites, focal collection or pneumoperitoneum. Musculoskeletal: Posterior fusion changes at L4-L5 noted. A thoracic cord stimulator is identified. No acute or suspicious bony findings are noted. IMPRESSION: 1. No evidence of  acute abnormality. No bowel abnormalities identified. 2. Heterogeneity of the central uterus which may represent endometrial thickening/mass. Pelvic ultrasound is recommended for further evaluation. 3. Hepatic steatosis. 4. Aortic Atherosclerosis (ICD10-I70.0). Electronically Signed   By:  Margarette Canada M.D.   On: 05/22/2020 10:13    Procedures Procedures   Medications Ordered in ED Medications  heparin injection 5,000 Units (5,000 Units Subcutaneous Patient Refused/Not Given 05/23/20 0537)  sodium chloride flush (NS) 0.9 % injection 3 mL (3 mLs Intravenous Given 05/22/20 2114)  acetaminophen (TYLENOL) tablet 650 mg (has no administration in time range)    Or  acetaminophen (TYLENOL) suppository 650 mg (has no administration in time range)  albuterol (PROVENTIL) (2.5 MG/3ML) 0.083% nebulizer solution 2.5 mg (has no administration in time range)  loperamide (IMODIUM) capsule 2 mg (has no administration in time range)  HYDROcodone-acetaminophen (NORCO) 10-325 MG per tablet 1 tablet (has no administration in time range)  rosuvastatin (CRESTOR) tablet 10 mg (10 mg Oral Not Given 05/22/20 1800)  ALPRAZolam (XANAX) tablet 0.5 mg (0.5 mg Oral Given 05/22/20 2113)  predniSONE (DELTASONE) tablet 10 mg (has no administration in time range)  progesterone (PROMETRIUM) capsule 100 mg (100 mg Oral Given 05/22/20 2113)  Mesalamine (ASACOL) DR capsule 400 mg (400 mg Oral Given 05/22/20 2113)  pantoprazole (PROTONIX) EC tablet 40 mg (40 mg Oral Given 05/22/20 2113)  methocarbamol (ROBAXIN) tablet 750 mg (has no administration in time range)  pyridostigmine (MESTINON) tablet 30 mg (30 mg Oral Not Given 05/22/20 1800)  rOPINIRole (REQUIP) tablet 1 mg (1 mg Oral Given 05/22/20 2250)  mometasone-formoterol (DULERA) 100-5 MCG/ACT inhaler 2 puff (2 puffs Inhalation Given 05/22/20 2113)  hydrALAZINE (APRESOLINE) injection 10 mg (has no administration in time range)  chlorproMAZINE (THORAZINE) 12.5 mg in sodium chloride 0.9 % 25 mL IVPB (has no administration in time range)  mycophenolate (CELLCEPT) capsule 1,000 mg (1,000 mg Oral Given 05/22/20 2116)  0.9 %  sodium chloride infusion ( Intravenous New Bag/Given 05/23/20 0534)  ondansetron (ZOFRAN) injection 4 mg (4 mg Intravenous Given 05/23/20 0049)  magnesium sulfate  IVPB 4 g 100 mL (4 g Intravenous New Bag/Given 05/23/20 0537)  lactated ringers bolus 1,000 mL (0 mLs Intravenous Stopped 05/22/20 0914)  ondansetron (ZOFRAN) injection 4 mg (4 mg Intravenous Given 05/22/20 0806)  lactated ringers bolus 1,000 mL ( Intravenous Stopped 05/22/20 1038)  promethazine (PHENERGAN) injection 12.5 mg (12.5 mg Intravenous Given 05/22/20 0920)  loperamide (IMODIUM) capsule 2 mg (2 mg Oral Given 05/22/20 1142)  promethazine (PHENERGAN) injection 12.5 mg (12.5 mg Intravenous Given 05/22/20 1339)  sodium bicarbonate 150 mEq in sterile water 1,000 mL infusion ( Intravenous Stopped 05/23/20 0533)  HYDROcodone-acetaminophen (NORCO/VICODIN) 5-325 MG per tablet 2 tablet (2 tablets Oral Given 05/22/20 1856)  0.45 % sodium chloride infusion (0 mLs Intravenous Stopped 05/23/20 0533)  potassium chloride SA (KLOR-CON) CR tablet 20 mEq (20 mEq Oral Given 05/22/20 1856)    ED Course  I have reviewed the triage vital signs and the nursing notes.  Pertinent labs & imaging results that were available during my care of the patient were reviewed by me and considered in my medical decision making (see chart for details).    MDM Rules/Calculators/A&P                          Patient presents with nausea vomiting diarrhea.  Had recent back surgery and is on pain medicine.  Some abdominal tenderness.  Patient has new onset acute  kidney injury.  Creatinine now about 4.  Vitals reassuring.  Doubt this is a sepsis.  CT scan done and overall reassuring.  Help her with new renal failure patient require admission to the hospital.  Discussed with hospitalist. Final Clinical Impression(s) / ED Diagnoses Final diagnoses:  Nausea vomiting and diarrhea  AKI (acute kidney injury) [facility])    Rx / Standard Orders ED Discharge Orders    None       Davonna Belling, MD 05/23/20 [phone]

## 2020-05-22 NOTE — ED Triage Notes (Signed)
Pt c/o N/V/D x 2 weeks. Pt had back surgery on 05/04/2020, pt had negative COVID test at that time.

## 2020-05-22 NOTE — Therapy (Signed)
NIF -40 x3

## 2020-05-23 LAB — BASIC METABOLIC PANEL
Anion gap: 16 — ABNORMAL HIGH (ref 5–15)
BUN: 67 mg/dL — ABNORMAL HIGH (ref 6–20)
CO2: 19 mmol/L — ABNORMAL LOW (ref 22–32)
Calcium: 8.5 mg/dL — ABNORMAL LOW (ref 8.9–10.3)
Chloride: 101 mmol/L (ref 98–111)
Creatinine, Ser: 4.16 mg/dL — ABNORMAL HIGH (ref 0.44–1.00)
GFR, Estimated: 12 mL/min — ABNORMAL LOW (ref >60)
Glucose, Bld: 103 mg/dL — ABNORMAL HIGH (ref 70–99)
Potassium: 2.3 mmol/L — CL (ref 3.5–5.1)
Sodium: 136 mmol/L (ref 135–145)

## 2020-05-23 LAB — CBC
HCT: 27.9 % — ABNORMAL LOW (ref 36.0–46.0)
Hemoglobin: 10 g/dL — ABNORMAL LOW (ref 12.0–15.0)
MCH: 32.5 pg (ref 26.0–34.0)
MCHC: 35.8 g/dL (ref 30.0–36.0)
MCV: 90.6 fL (ref 80.0–100.0)
Platelets: 269 10*3/uL (ref 150–400)
RBC: 3.08 MIL/uL — ABNORMAL LOW (ref 3.87–5.11)
RDW: 13.3 % (ref 11.5–15.5)
WBC: 14.9 10*3/uL — ABNORMAL HIGH (ref 4.0–10.5)
nRBC: 0 % (ref 0.0–0.2)

## 2020-05-23 LAB — C DIFFICILE QUICK SCREEN W PCR REFLEX
C Diff antigen: NEGATIVE
C Diff interpretation: NOT DETECTED
C Diff toxin: NEGATIVE

## 2020-05-23 LAB — MAGNESIUM
Magnesium: 1.1 mg/dL — ABNORMAL LOW (ref 1.7–2.4)
Magnesium: 3.3 mg/dL — ABNORMAL HIGH (ref 1.7–2.4)

## 2020-05-23 LAB — PHOSPHORUS: Phosphorus: 5.2 mg/dL — ABNORMAL HIGH (ref 2.5–4.6)

## 2020-05-23 LAB — POTASSIUM
Potassium: 2.3 mmol/L — CL (ref 3.5–5.1)
Potassium: 2.9 mmol/L — ABNORMAL LOW (ref 3.5–5.1)

## 2020-05-23 MED ORDER — MAGNESIUM SULFATE 4 GM/100ML IV SOLN
4.0000 g | Freq: Once | INTRAVENOUS | Status: AC
  Administered 2020-05-23: 4 g via INTRAVENOUS
  Filled 2020-05-23: qty 100

## 2020-05-23 MED ORDER — MAGNESIUM SULFATE 2 GM/50ML IV SOLN: 2.0000 g | Freq: Once | INTRAVENOUS | Status: DC

## 2020-05-23 MED ORDER — POTASSIUM CHLORIDE IN NACL 40-0.9 MEQ/L-% IV SOLN
INTRAVENOUS | Status: AC
  Filled 2020-05-23 (×5): qty 1000

## 2020-05-23 MED ORDER — MAGNESIUM SULFATE 4 GM/100ML IV SOLN
4.0000 g | Freq: Two times a day (BID) | INTRAVENOUS | Status: DC
  Filled 2020-05-23: qty 100

## 2020-05-23 MED ORDER — POTASSIUM CHLORIDE 10 MEQ/100ML IV SOLN
10.0000 meq | INTRAVENOUS | Status: DC
Start: 2020-05-23 — End: 2020-05-23

## 2020-05-23 MED ORDER — POTASSIUM CHLORIDE 10 MEQ/100ML IV SOLN
10.0000 meq | INTRAVENOUS | Status: AC
  Administered 2020-05-23 (×2): 10 meq via INTRAVENOUS
  Filled 2020-05-23 (×2): qty 100

## 2020-05-23 NOTE — Plan of Care (Signed)

## 2020-05-23 NOTE — Progress Notes (Signed)
No documentation of critical potassium notification. Attending MD notified. Awaiting Orders.

## 2020-05-23 NOTE — Therapy (Signed)
NIF -40x3

## 2020-05-23 NOTE — Progress Notes (Signed)
PROGRESS NOTE  Teresa Lee BHA:[id number] DOB: 1961/03/04 DOA: 05/22/2020 PCP: [name], MD  HPI/Recap of past 24 hours: Teresa Lee is a 60 y.o. female with medical history significant of HTN, HLD, myasthenia gravis, fibromyalgia, asthma, restless leg syndrome, and Crohn's disease who presents with complaints of nausea and diarrhea over the last 2 weeks.  Most of history is obtained from the patient's husband who is present at bedside at her request.  Patient had L4-5 TLIF and right micro discectomy of L5-S1 on 2/15 with Dr. [name] of neurosurgery.  Patient stayed hospitalized for 3 days following surgery and was discharged home.  She has been taking hydrocodone-acetaminophen 10 mg-325 mg up to 5 per daily for back pain.  Over the last 2 weeks patient has complained of persistent nausea and has had no appetite.  She reports everything taste and smells horrible.  Patient has been taking Zofran almost around-the-clock.  Denies any vomiting, but she has had dry heaves.  Initially following the surgery she had been constipated, but after taking every laxatives and enemas she was finally able to have a bowel movement 7 days ago.  However, since that time patient has had persistent diarrhea.  Associated symptoms include possibly 12 pound weight loss, decreased urine output, intermittent hot/cold spells, constant  hiccups, issues with her memory, intermittently disoriented at times, and using a walker to get around when previously she could ambulate without assistance.    ED Course: On admission to the emergency department patient was seen to be afebrile relatively stable vital signs.  Labs significant for WBC 21.5, platelets 460, potassium 3.3, CO2 16, BUN 92, creatinine 5.47, anion gap 20, and lipase 116.  COVID-19 screening was negative.  CT scan of the abdomen and pelvis only noted of the heterogeneity of the central uterus which may represent endometrial thickening/mass.  Patient had  been ordered 2 L of normal saline IV fluids, Imodium, and antiemetics.  05/23/20: Patient was seen and examined at her bedside this morning.  She reports persistent nausea with no vomiting.  Also reports diarrhea with more than 5 watery stool in 24 hours.  She states her symptoms do not feel like her typical flare from Crohn's disease.  GI panel and C. difficile PCR have been ordered and are pending, follow results.  IV antiemetics in place as needed.   Assessment/Plan: Principal Problem:   ARF (acute renal failure) (HCC) Active Problems:   Essential hypertension   Asthma   Leukocytosis   Hypokalemia   Myasthenia gravis (HCC)   Crohn disease (HCC)   GERD (gastroesophageal reflux disease)   Anxiety disorder   Chronic pain disorder   Diarrhea   Nausea  Acute intractable nausea and diarrhea, unclear etiology She states her symptoms do not feel like her typical flare from Crohn's disease.  GI panel and C. difficile PCR have been ordered and are pending, follow results.  IV antiemetics in place as needed. No CT evidence of Crohn's flare Rule out infective etiology.  Nonoliguric AKI likely prerenal in the setting of dehydration from diarrhea and poor oral intake from intractable nausea. AKI is improving, creatinine is downtrending. Baseline creatinine appears to be 0.9 with GFR greater than 60 Presented with creatinine greater than 5 with BUN of 92. Continue IV fluid Continue to monitor urine output Continue to avoid nephrotoxic agents Daily renal panel.  Hypokalemia likely from GI losses with diarrhea. Presented with serum potassium of 2.3 Repleted orally and intravenously Repeat level in the  afternoon. Continue daily replacement with ongoing diarrhea  Hypomagnesemia likely from GI losses with diarrhea. Serum magnesium 1.1 Repleted intravenously with 2 g of IV magnesium. Repeat level and replete as indicated.  Leukocytosis, rule out infective process Follow GI panel and C.  difficile PCR. No evidence of acute abnormality from CT abdomen and pelvis without contrast.  Hypophosphatemia likely in the setting of renal failure Phosphorus 5.2 Repeat level in the morning.  Crohn's disease Continue home regimen. She is on mesalamine and prednisone  Hyperlipidemia Continue home Crestor.  Restless leg syndrome Continue home Requip.  Chronic anxiety Continue home regimen  GERD Continue home PPI twice daily.  Aortic atherosclerosis seen on CT scan. Continue home Crestor     Code Status: Full code.  Family Communication: None at bedside.  Disposition Plan: Likely discharge to home on 05/25/2020 or when diarrhea has resolved.   Consultants:  None.  Procedures:  None.  Antimicrobials:  None.  DVT prophylaxis: SQ heparin 3 times daily.  Status is: Inpatient    Dispo:  Patient From: Home  Planned Disposition: Home  Anticipated discharge date: 05/25/2020 or when diarrhea has improved.  Medically stable for discharge: No, ongoing management of diarrhea.          Objective: Vitals:   05/23/20 0047 05/23/20 0426 05/23/20 0729 05/23/20 0736  BP: 140/69 (!) 153/89 136/78   Pulse: 79 84 83 79  Resp: 19 19 15    Temp: 98.2 F (36.8 C) 98.6 F (37 C) 98 F (36.7 C)   TempSrc: Oral Oral Oral   SpO2: 97% 97% 98% 97%  Weight: 66.9 kg     Height:        Intake/Output Summary (Last 24 hours) at 05/23/2020 1645 Last data filed at 05/23/2020 1300 Gross per 24 hour  Intake 240 ml  Output 1250 ml  Net -1010 ml   Filed Weights   05/22/20 0739 05/22/20 1437 05/23/20 0047  Weight: 70.3 kg 66.3 kg 66.9 kg    Exam:  . General: 60 y.o. year-old female well developed well nourished in no acute distress.  Alert and oriented x3. . Cardiovascular: Regular rate and rhythm with no rubs or gallops.  No thyromegaly or JVD noted.   Marland Kitchen Respiratory: Clear to auscultation with no wheezes or rales. Good inspiratory effort. . Abdomen: Soft nontender  nondistended with normal bowel sounds present.  . Musculoskeletal: No lower extremity edema bilaterally. . Skin: No ulcerative lesions noted or rashes. . Psychiatry: Mood is appropriate for condition and setting   Data Reviewed: CBC: Recent Labs  Lab 05/22/20 0800 05/23/20 0247  WBC 21.5* 14.9*  NEUTROABS 15.4*  --   HGB 12.9 10.0*  HCT 37.4 27.9*  MCV 93.7 90.6  PLT 460* 226   Basic Metabolic Panel: Recent Labs  Lab 05/22/20 0800 05/23/20 0247 05/23/20 0514  NA 135 136  --   K 3.3* 2.3* 2.3*  CL 99 101  --   CO2 16* 19*  --   GLUCOSE 163* 103*  --   BUN 92* 67*  --   CREATININE 5.47* 4.16*  --   CALCIUM 10.2 8.5*  --   MG  --  1.1*  --   PHOS  --  5.2*  --    GFR: Estimated Creatinine Clearance: 12.6 mL/min (A) (by C-G formula based on SCr of 4.16 mg/dL (H)). Liver Function Tests: Recent Labs  Lab 05/22/20 0800  AST 13*  ALT 7  ALKPHOS 71  BILITOT 0.5  PROT 9.0*  ALBUMIN 4.4   Recent Labs  Lab 05/22/20 0800  LIPASE 116*   No results for input(s): AMMONIA in the last 168 hours. Coagulation Profile: No results for input(s): INR, PROTIME in the last 168 hours. Cardiac Enzymes: No results for input(s): CKTOTAL, CKMB, CKMBINDEX, TROPONINI in the last 168 hours. BNP (last 3 results) No results for input(s): PROBNP in the last 8760 hours. HbA1C: No results for input(s): HGBA1C in the last 72 hours. CBG: No results for input(s): GLUCAP in the last 168 hours. Lipid Profile: No results for input(s): CHOL, HDL, LDLCALC, TRIG, CHOLHDL, LDLDIRECT in the last 72 hours. Thyroid Function Tests: No results for input(s): TSH, T4TOTAL, FREET4, T3FREE, THYROIDAB in the last 72 hours. Anemia Panel: No results for input(s): VITAMINB12, FOLATE, FERRITIN, TIBC, IRON, RETICCTPCT in the last 72 hours. Urine analysis:    Component Value Date/Time   COLORURINE AMBER (A) 02/17/2019 0305   APPEARANCEUR CLOUDY (A) 02/17/2019 0305   LABSPEC >1.030 (H) 02/17/2019 0305    PHURINE 5.5 02/17/2019 0305   GLUCOSEU NEGATIVE 02/17/2019 0305   HGBUR MODERATE (A) 02/17/2019 0305   BILIRUBINUR SMALL (A) 02/17/2019 0305   KETONESUR NEGATIVE 02/17/2019 0305   PROTEINUR 100 (A) 02/17/2019 0305   NITRITE NEGATIVE 02/17/2019 0305   LEUKOCYTESUR NEGATIVE 02/17/2019 0305   Sepsis Labs: @LABRCNTIP (procalcitonin:4,lacticidven:4)  ) Recent Results (from the past 240 hour(s))  SARS Coronavirus 2 by RT PCR (hospital order, performed in [place] hospital lab)     Status: None   Collection Time: 05/22/20  9:50 AM  Result Value Ref Range Status   SARS Coronavirus 2 NEGATIVE NEGATIVE Final    Comment: (NOTE) SARS-CoV-2 target nucleic acids are NOT DETECTED.  The SARS-CoV-2 RNA is generally detectable in upper and lower respiratory specimens during the acute phase of infection. The lowest concentration of SARS-CoV-2 viral copies this assay can detect is 250 copies / mL. A negative result does not preclude SARS-CoV-2 infection and should not be used as the sole basis for treatment or other patient management decisions.  A negative result may occur with improper specimen collection / handling, submission of specimen other than nasopharyngeal swab, presence of viral mutation(s) within the areas targeted by this assay, and inadequate number of viral copies (<250 copies / mL). A negative result must be combined with clinical observations, patient history, and epidemiological information.  Fact Sheet for Patients:   StrictlyIdeas.no  Fact Sheet for Healthcare Providers: BankingDealers.co.za  This test is not yet approved or  cleared by the [place] FDA and has been authorized for detection and/or diagnosis of SARS-CoV-2 by FDA under an Emergency Use Authorization (EUA).  This EUA will remain in effect (meaning this test can be used) for the duration of the COVID-19 declaration under Section 564(b)(1) of the Act, 21  U.S.C. section 360bbb-3(b)(1), unless the authorization is terminated or revoked sooner.  Performed at [facility], [address] [place], [name] [address]       Studies: No results found.  Scheduled Meds: . ALPRAZolam  0.5 mg Oral QHS  . heparin  5,000 Units Subcutaneous Q8H  . Mesalamine  400 mg Oral BID  . mometasone-formoterol  2 puff Inhalation BID  . mycophenolate  1,000 mg Oral BID  . pantoprazole  40 mg Oral BID  . predniSONE  10 mg Oral q morning  . progesterone  100 mg Oral QHS  . pyridostigmine  30 mg Oral Daily  . rOPINIRole  1 mg Oral QHS,MR X 1  .  rosuvastatin  10 mg Oral Daily  . sodium chloride flush  3 mL Intravenous Q12H    Continuous Infusions: . 0.9 % NaCl with KCl 40 mEq / L 75 mL/hr at 05/23/20 1205  . chlorproMAZINE (THORAZINE) IV       LOS: 1 day     Kayleen Memos, MD [facility] Pager [phone]  If 7PM-7AM, please contact night-coverage [email or web address] Password [facility] 05/23/2020, 4:45 PM

## 2020-05-23 NOTE — Progress Notes (Signed)
CRITICAL VALUE STICKER  CRITICAL VALUE: K+ 2.3  RECEIVER (on-site recipient of call): L. Forsan NOTIFIED: 05/23/20 0432  MESSENGER (representative from lab):  MD NOTIFIED: RATHORE  TIME OF NOTIFICATION:0433 RESPONSE: AWAITING CALLBACK OR ORDERS

## 2020-05-23 NOTE — Progress Notes (Signed)
NIF -40 with good pt effort.

## 2020-05-24 ENCOUNTER — Encounter (HOSPITAL_COMMUNITY): Payer: Self-pay | Admitting: Internal Medicine

## 2020-05-24 ENCOUNTER — Inpatient Hospital Stay (HOSPITAL_COMMUNITY): Payer: Medicare HMO | Admitting: Certified Registered"

## 2020-05-24 ENCOUNTER — Encounter (HOSPITAL_COMMUNITY)
Admission: EM | Disposition: A | Discharge status: Home / Self Care | Payer: Self-pay | Source: Home / Self Care | Attending: Internal Medicine

## 2020-05-24 HISTORY — PX: ESOPHAGOGASTRODUODENOSCOPY (EGD) WITH PROPOFOL: SHX5813

## 2020-05-24 HISTORY — PX: BIOPSY: SHX5522

## 2020-05-24 HISTORY — PX: COLONOSCOPY WITH PROPOFOL: SHX5780

## 2020-05-24 LAB — BASIC METABOLIC PANEL
Anion gap: 14 (ref 5–15)
BUN: 43 mg/dL — ABNORMAL HIGH (ref 6–20)
CO2: 17 mmol/L — ABNORMAL LOW (ref 22–32)
Calcium: 8.7 mg/dL — ABNORMAL LOW (ref 8.9–10.3)
Chloride: 108 mmol/L (ref 98–111)
Creatinine, Ser: 3.06 mg/dL — ABNORMAL HIGH (ref 0.44–1.00)
GFR, Estimated: 17 mL/min — ABNORMAL LOW (ref >60)
Glucose, Bld: 105 mg/dL — ABNORMAL HIGH (ref 70–99)
Potassium: 3.1 mmol/L — ABNORMAL LOW (ref 3.5–5.1)
Sodium: 139 mmol/L (ref 135–145)

## 2020-05-24 LAB — GASTROINTESTINAL PANEL BY PCR, STOOL (REPLACES STOOL CULTURE)

## 2020-05-24 LAB — CBC
HCT: 29.3 % — ABNORMAL LOW (ref 36.0–46.0)
Hemoglobin: 10.1 g/dL — ABNORMAL LOW (ref 12.0–15.0)
MCH: 32.1 pg (ref 26.0–34.0)
MCHC: 34.5 g/dL (ref 30.0–36.0)
MCV: 93 fL (ref 80.0–100.0)
Platelets: 218 10*3/uL (ref 150–400)
RBC: 3.15 MIL/uL — ABNORMAL LOW (ref 3.87–5.11)
RDW: 13 % (ref 11.5–15.5)
WBC: 12.4 10*3/uL — ABNORMAL HIGH (ref 4.0–10.5)
nRBC: 0 % (ref 0.0–0.2)

## 2020-05-24 LAB — MAGNESIUM: Magnesium: 2.7 mg/dL — ABNORMAL HIGH (ref 1.7–2.4)

## 2020-05-24 SURGERY — ESOPHAGOGASTRODUODENOSCOPY (EGD) WITH PROPOFOL
Anesthesia: Monitor Anesthesia Care

## 2020-05-24 MED ORDER — SODIUM CHLORIDE 0.9 % IV SOLN: INTRAVENOUS | Status: DC

## 2020-05-24 MED ORDER — LACTATED RINGERS IV SOLN: INTRAVENOUS | Status: DC

## 2020-05-24 MED ORDER — PROMETHAZINE HCL 25 MG/ML IJ SOLN
25.0000 mg | Freq: Four times a day (QID) | INTRAMUSCULAR | Status: DC | PRN
  Administered 2020-05-24 – 2020-05-26 (×6): 25 mg via INTRAVENOUS
  Filled 2020-05-24 (×7): qty 1

## 2020-05-24 MED ORDER — LOPERAMIDE HCL 2 MG PO CAPS
2.0000 mg | ORAL_CAPSULE | Freq: Four times a day (QID) | ORAL | Status: DC
  Administered 2020-05-24: 2 mg via ORAL
  Filled 2020-05-24: qty 1

## 2020-05-24 MED ORDER — GERHARDT'S BUTT CREAM
TOPICAL_CREAM | Freq: Three times a day (TID) | CUTANEOUS | Status: DC
  Administered 2020-05-24: 1 via TOPICAL
  Filled 2020-05-24: qty 1

## 2020-05-24 MED ORDER — ONDANSETRON HCL 4 MG/2ML IJ SOLN
INTRAMUSCULAR | Status: DC | PRN
  Administered 2020-05-24: 4 mg via INTRAVENOUS

## 2020-05-24 MED ORDER — LACTATED RINGERS IV SOLN: INTRAVENOUS | Status: DC | PRN

## 2020-05-24 MED ORDER — PROPOFOL 500 MG/50ML IV EMUL
INTRAVENOUS | Status: DC | PRN
  Administered 2020-05-24: 125 ug/kg/min via INTRAVENOUS

## 2020-05-24 MED ORDER — POTASSIUM CHLORIDE CRYS ER 20 MEQ PO TBCR
40.0000 meq | EXTENDED_RELEASE_TABLET | Freq: Two times a day (BID) | ORAL | Status: AC
  Administered 2020-05-24 – 2020-05-27 (×6): 40 meq via ORAL
  Filled 2020-05-24 (×6): qty 2

## 2020-05-24 MED ORDER — METHOCARBAMOL 500 MG PO TABS
500.0000 mg | ORAL_TABLET | Freq: Three times a day (TID) | ORAL | Status: DC | PRN
  Administered 2020-05-24: 500 mg via ORAL
  Filled 2020-05-24: qty 1

## 2020-05-24 MED ORDER — METHOCARBAMOL 500 MG PO TABS: 750.0000 mg | ORAL_TABLET | Freq: Three times a day (TID) | ORAL | Status: DC | PRN

## 2020-05-24 MED ORDER — LOPERAMIDE HCL 2 MG PO CAPS
4.0000 mg | ORAL_CAPSULE | Freq: Four times a day (QID) | ORAL | Status: DC
  Administered 2020-05-24 – 2020-05-28 (×15): 4 mg via ORAL
  Filled 2020-05-24 (×15): qty 2

## 2020-05-24 MED ORDER — POTASSIUM CHLORIDE 10 MEQ/100ML IV SOLN
10.0000 meq | INTRAVENOUS | Status: DC
  Administered 2020-05-24: 10 meq via INTRAVENOUS
  Filled 2020-05-24 (×2): qty 100

## 2020-05-24 SURGICAL SUPPLY — 14 items

## 2020-05-24 NOTE — Consult Note (Signed)
Referring Provider: [facility] Primary Care Physician:  [name], MD Primary Gastroenterologist:  Dr. Cannon Kettle  Reason for Consultation: Nausea, vomiting, diarrhea  HPI: Teresa Lee is a 60 y.o. female with background history of chronic back pain status post surgery 3 weeks ago, myasthenia gravis (on CellCept), and labeled as having Crohn's ileocolitis on the basis of colonoscopies through the years showing some mild patchy ileitis and colitis at various times, although her most recent colonoscopy, September 2020, was both endoscopically and histologically normal for both the colon and terminal ileum.  The patient is maintained on mesalamine 800 mg twice daily.  She does get periodic flares of abdominal symptoms including nausea, diarrhea, and severe abdominal pain which are not necessarily felt to be related to Crohn's.    With all that background, she was admitted to the hospital 2 days ago with acute kidney injury, following a roughly 2-week history of severe nausea (no vomiting) and a 1-2 week history of diarrhea which began after use of laxatives for severe constipation from postoperative pain medication.    The nausea is persistent and not relieved by Zofran; no vomiting, but dry heaves.  She really has not been able to eat.   The diarrhea is characterized by 5-10 small, watery or mushy bowel movements per day.  A CT scan on this admission is unrevealing for a source of symptoms, although it does show hepatic steatosis and the patient is status post cholecystectomy (liver chemistries normal).    Lipase was mildly elevated at 116 on admission, but the pancreas was radiographically normal by CT and the patient was admitted with acute kidney injury, creatinine 5.47 (versus 0.95 when checked 3 weeks earlier), which might account for for the elevation of lipase level.   There was no radiographic evidence of active inflammatory bowel disease.  Stool for C. difficile  was negative.  A GI pathogen panel is still in process.   Past Medical History:  Diagnosis Date  . Arthritis    osteoarthritis  . Asthma   . Blind left eye   . Cancer ([place])    skin  . Crohn's disease ([place])   . CTS (carpal tunnel syndrome)   . DJD (degenerative joint disease)    Both cervical spine and LS spine  . Fibromyalgia   . GERD (gastroesophageal reflux disease)   . Heart murmur   . Herniated nucleus pulposus   . Hyperlipidemia   . Hypertension   . IBS (irritable bowel syndrome)   . Leukocytosis   . Myasthenia gravis   . Myasthenia gravis ([place])   . Myasthenia gravis ([place])   . Pneumonia   . Spinal cord stimulator status     Past Surgical History:  Procedure Laterality Date  . BREAST REDUCTION SURGERY    . CARPAL TUNNEL RELEASE     bilateral  . cervical disc inj    . CHOLECYSTECTOMY    . ENDOMETRIAL ABLATION W/ NOVASURE    . LUMBAR LAMINECTOMY/DECOMPRESSION MICRODISCECTOMY Right 05/04/2020   Procedure: Right Lumbar Five Sacral One Microdiscectomy;  Surgeon: Erline Levine, MD;  Location: [facility];  Service: Neurosurgery;  Laterality: Right;  posterior  . MELANOMA EXCISION    . NASAL SINUS SURGERY    . SPINAL CORD STIMULATOR INSERTION  2019  . THYMECTOMY     due to myastenia gravis  . THYMECTOMY    . TRANSFORAMINAL LUMBAR INTERBODY FUSION (TLIF) WITH PEDICLE SCREW FIXATION 1 LEVEL Right 05/04/2020   Procedure: Right Lumbar Four-Five Transforaminal  lumbar interbody fusion;  Surgeon: Erline Levine, MD;  Location: [facility];  Service: Neurosurgery;  Laterality: Right;  posterior    Prior to Admission medications   Medication Sig Start Date End Date Taking? Authorizing Provider  albuterol (PROVENTIL) (2.5 MG/3ML) 0.083% nebulizer solution Take 2.5 mg by nebulization every 6 (six) hours as needed for shortness of breath. 12/26/19  Yes [provider]  albuterol (VENTOLIN HFA) 108 (90 Base) MCG/ACT inhaler 1 PUFF EVERY 6 HOURS AS NEEDED FOR WHEEZING Patient taking  differently: Inhale 1 puff into the lungs every 6 (six) hours as needed for wheezing or shortness of breath. 09/05/19  Yes [place], Betty G, MD  ALPRAZolam Duanne Moron) 0.5 MG tablet Take 0.5 mg by mouth at bedtime.   Yes [provider]  desmopressin (DDAVP) 0.1 MG tablet Take 0.3 mg by mouth at bedtime.   Yes [provider]  estradiol (ESTRACE) 2 MG tablet Take 2 mg by mouth at bedtime.   Yes [provider]  HYDROcodone-acetaminophen (NORCO) 10-325 MG tablet Take 1 tablet by mouth 5 (five) times daily as needed for moderate pain. 08/25/17  Yes [provider]  losartan (COZAAR) 50 MG tablet TAKE 1 TABLET DAILY Patient taking differently: Take 50 mg by mouth every morning. 01/26/20  Yes [place], Betty G, MD  Mesalamine (ASACOL) 400 MG CPDR DR capsule Take 400 mg by mouth 2 (two) times daily. 12/10/18  Yes [provider]  methocarbamol (ROBAXIN) 750 MG tablet Take 750 mg by mouth at bedtime. 02/07/19  Yes [provider]  mycophenolate (CELLCEPT) 500 MG tablet Take 1,000 mg by mouth 2 (two) times daily.    Yes [provider]  naloxone (NARCAN) nasal spray 4 mg/0.1 mL Place 1 spray into the nose once as needed (opioid overdose).   Yes [provider]  omega-3 acid ethyl esters (LOVAZA) 1 g capsule Take 1 capsule (1 g total) by mouth 2 (two) times daily. Patient taking differently: Take 1 g by mouth daily. 10/15/19  Yes [place], Betty G, MD  ondansetron (ZOFRAN) 4 MG tablet Take 1 tablet (4 mg total) by mouth every 8 (eight) hours as needed for nausea or vomiting. 02/25/19  Yes Elodia Florence., MD  pantoprazole (PROTONIX) 40 MG tablet TAKE 1 TABLET DAILY Patient taking differently: Take 40 mg by mouth 2 (two) times daily. 10/15/19  Yes [place], Betty G, MD  potassium chloride SA (KLOR-CON) 20 MEQ tablet TAKE 1 TABLET DAILY Patient taking differently: Take 20 mEq by mouth daily. 01/16/20  Yes [place], Betty G, MD  predniSONE (DELTASONE)  10 MG tablet Take 10 mg by mouth every morning.   Yes [provider]  progesterone (PROMETRIUM) 100 MG capsule Take 100 mg by mouth at bedtime.   Yes [provider]  rOPINIRole (REQUIP) 1 MG tablet Take 1-2 tablets (1-2 mg total) by mouth at bedtime. Patient taking differently: Take 1 mg by mouth See admin instructions. Take 1 tablet (1 mg) by mouth daily at bedtime, may take a 2nd tablet (1 mg) later if still needed for restless legs 08/08/19  Yes [place], Betty G, MD  rosuvastatin (CRESTOR) 40 MG tablet Take 1 tablet (40 mg total) by mouth daily. 02/02/19  Yes [place], Betty G, MD  SYMBICORT 80-4.5 MCG/ACT inhaler Inhale 2 puffs into the lungs 2 (two) times daily. Patient taking differently: Inhale 2 puffs into the lungs 2 (two) times daily as needed (shortness of breath). 09/05/19  Yes [place], Betty G, MD  triamterene-hydrochlorothiazide (  DYAZIDE) 37.5-25 MG capsule TAKE (1) CAPSULE DAILY IN THE MORNING. Patient taking differently: Take 1 capsule by mouth every morning. 04/25/19  Yes [place], Betty G, MD  Vitamin D, Ergocalciferol, (DRISDOL) 1.25 MG (50000 UNIT) CAPS capsule 1 CAPSULE EVERY 2 WEEKS Patient taking differently: Take 50,000 Units by mouth See admin instructions. Take one capsule (50,000 units) by mouth every other Sunday 08/08/19  Yes [place], Betty G, MD  cyanocobalamin (,VITAMIN B-12,) 1000 MCG/ML injection Inject 1,000 mcg into the muscle every 30 (thirty) days.    [provider]  pyridostigmine (MESTINON) 60 MG tablet Take 30 mg by mouth daily. 12/27/17   [provider]  Spacer/Aero-Holding Chambers (AEROCHAMBER PLUS) inhaler Use as instructed to use with inahaler. 08/30/18   [place], Betty G, MD    Current Facility-Administered Medications  Medication Dose Route Frequency Provider Last Rate Last Admin  . 0.9 % NaCl with KCl 40 mEq / L  infusion   Intravenous Continuous Kayleen Memos, DO 75 mL/hr at 05/24/20 0013 New Bag at 05/24/20 0013  .  acetaminophen (TYLENOL) tablet 650 mg  650 mg Oral Q6H PRN Fuller Plan A, MD       Or  . acetaminophen (TYLENOL) suppository 650 mg  650 mg Rectal Q6H PRN Tamala Julian, Rondell A, MD      . albuterol (PROVENTIL) (2.5 MG/3ML) 0.083% nebulizer solution 2.5 mg  2.5 mg Nebulization Q6H PRN Fuller Plan A, MD      . ALPRAZolam Duanne Moron) tablet 0.5 mg  0.5 mg Oral QHS Smith, Rondell A, MD   0.5 mg at 05/23/20 2120  . chlorproMAZINE (THORAZINE) 12.5 mg in sodium chloride 0.9 % 25 mL IVPB  12.5 mg Intravenous Q8H PRN Tamala Julian, Rondell A, MD      . heparin injection 5,000 Units  5,000 Units Subcutaneous Q8H Smith, Rondell A, MD   5,000 Units at 05/23/20 2119  . hydrALAZINE (APRESOLINE) injection 10 mg  10 mg Intravenous Q4H PRN Fuller Plan A, MD      . HYDROcodone-acetaminophen (NORCO) 10-325 MG per tablet 1 tablet  1 tablet Oral 5 X Daily PRN Norval Morton, MD   1 tablet at 05/23/20 1325  . loperamide (IMODIUM) capsule 2 mg  2 mg Oral PRN Fuller Plan A, MD   2 mg at 05/24/20 0011  . Mesalamine (ASACOL) DR capsule 400 mg  400 mg Oral BID Fuller Plan A, MD   400 mg at 05/23/20 2119  . methocarbamol (ROBAXIN) tablet 750 mg  750 mg Oral Daily PRN Fuller Plan A, MD      . mometasone-formoterol (DULERA) 100-5 MCG/ACT inhaler 2 puff  2 puff Inhalation BID Fuller Plan A, MD   2 puff at 05/24/20 0835  . mycophenolate (CELLCEPT) capsule 1,000 mg  1,000 mg Oral BID Fuller Plan A, MD   1,000 mg at 05/23/20 2119  . ondansetron (ZOFRAN) injection 4 mg  4 mg Intravenous Q6H PRN Fuller Plan A, MD   4 mg at 05/24/20 0831  . pantoprazole (PROTONIX) EC tablet 40 mg  40 mg Oral BID Fuller Plan A, MD   40 mg at 05/23/20 2119  . predniSONE (DELTASONE) tablet 10 mg  10 mg Oral q morning Fuller Plan A, MD   10 mg at 05/23/20 0945  . progesterone (PROMETRIUM) capsule 100 mg  100 mg Oral QHS Smith, Rondell A, MD   100 mg at 05/23/20 2119  . pyridostigmine (MESTINON) tablet 30 mg  30 mg Oral Daily Smith,  Rondell  A, MD      . rOPINIRole (REQUIP) tablet 1 mg  1 mg Oral QHS,MR X 1 Smith, Rondell A, MD   1 mg at 05/24/20 0002  . rosuvastatin (CRESTOR) tablet 10 mg  10 mg Oral Daily Fuller Plan A, MD   10 mg at 05/23/20 4481  . sodium chloride flush (NS) 0.9 % injection 3 mL  3 mL Intravenous Q12H Smith, Rondell A, MD   3 mL at 05/23/20 2121    Allergies as of 05/22/2020 - Review Complete 05/22/2020  Allergen Reaction Noted  . Duloxetine Nausea Only 03/01/2020  . Latex Other (See Comments) 05/15/2017  . Lorazepam Other (See Comments) 05/22/2017  . Orange concentrate [employer] agent] Other (See Comments) 04/27/2012  . Other Hives, Nausea Only, and Other (See Comments) 04/27/2012  . Percocet [oxycodone-acetaminophen] Hives 08/30/2011  . Sulfa antibiotics Hives 05/15/2017  . Bioflavonoids Rash 02/28/2017  . Cefixime Other (See Comments)   . Nucynta [tapentadol] Other (See Comments) 10/21/2016    Family History  Problem Relation Age of Onset  . Asthma Daughter   . Heart disease Mother   . Cancer Mother   . Hyperlipidemia Mother   . Hypertension Mother   . Diabetes Father     Social History   Socioeconomic History  . Marital status: Married    Spouse name: Not on file  . Number of children: 1  . Years of education: Not on file  . Highest education level: Not on file  Occupational History  . Occupation: disabled  Tobacco Use  . Smoking status: Never Smoker  . Smokeless tobacco: Never Used  Vaping Use  . Vaping Use: Never used  Substance and Sexual Activity  . Alcohol use: Yes    Comment: occassional wine  . Drug use: No  . Sexual activity: Not on file  Other Topics Concern  . Not on file  Social History Narrative  . Not on file   Social Determinants of Health   Financial Resource Strain: Not on file  Food Insecurity: Not on file  Transportation Needs: Not on file  Physical Activity: Not on file  Stress: Not on file  Social Connections: Not on file  Intimate  Partner Violence: Not on file    Review of Systems: Negative for chest pain, shortness of breath, skin rashes, focal neurologic symptoms.  Complaint of significant hemorrhoidal irritation.  Physical Exam: Vital signs in last 24 hours: Temp:  [98.5 F (36.9 C)-98.6 F (37 C)] 98.5 F (36.9 C) (03/07 0725) Pulse Rate:  [71-80] 80 (03/07 0725) Resp:  [16] 16 (03/06 1958) BP: (141)/(72) 141/72 (03/06 1958) SpO2:  [99 %-100 %] 99 % (03/07 0835) Weight:  [66.3 kg] 66.3 kg (03/07 0001) Last BM Date: 05/23/20 General:   Alert,  Well-developed, well-nourished, pleasant and cooperative, appears mildly uncomfortable or at least malaise. Head:  Normocephalic and atraumatic. Eyes:  Sclera clear, no icterus.   Conjunctiva pink. Mouth:   No ulcerations or lesions.  Oropharynx pink & moist. Neck:   No masses or thyromegaly. Lungs:  Clear throughout to auscultation.   No wheezes, crackles, or rhonchi. No evident respiratory distress. Heart:   Regular rate and rhythm; no murmurs, clicks, rubs,  or gallops. Abdomen:  Soft, nontender,and nondistended. No masses, hepatosplenomegaly or ventral hernias noted.  No succussion splash.   Msk:   Symmetrical without gross deformities. Pulses:  Normal radial pulse is noted. Extremities:   Without clubbing, cyanosis, or edema. Neurologic:  Alert and coherent;  grossly normal  neurologically. Skin:  Intact without significant lesions or rashes.  Warm and well perfused. Cervical Nodes:  No significant cervical adenopathy. Psych:   Alert and cooperative. Normal mood and affect.  Intake/Output from previous day: 03/06 0701 - 03/07 0700 In: 478.4 [P.O.:240; I.V.:238.4] Out: 1650 [Urine:1650] Intake/Output this shift: Total I/O In: -  Out: 400 [Urine:400]  Lab Results: Recent Labs    05/22/20 0800 05/23/20 0247 05/24/20 0802  WBC 21.5* 14.9* 12.4*  HGB 12.9 10.0* 10.1*  HCT 37.4 27.9* 29.3*  PLT 460* 269 218   BMET Recent Labs    05/22/20 0800  05/23/20 0247 05/23/20 0514 05/23/20 2258 05/24/20 0802  NA 135 136  --   --  139  K 3.3* 2.3* 2.3* 2.9* 3.1*  CL 99 101  --   --  108  CO2 16* 19*  --   --  17*  GLUCOSE 163* 103*  --   --  105*  BUN 92* 67*  --   --  43*  CREATININE 5.47* 4.16*  --   --  3.06*  CALCIUM 10.2 8.5*  --   --  8.7*   LFT Recent Labs    05/22/20 0800  PROT 9.0*  ALBUMIN 4.4  AST 13*  ALT 7  ALKPHOS 71  BILITOT 0.5   PT/INR No results for input(s): LABPROT, INR in the last 72 hours.  Studies/Results: CT ABDOMEN PELVIS WO CONTRAST  Result Date: 05/22/2020 CLINICAL DATA:  60 year old female with abdominal and pelvic pain with nausea and vomiting for 2 weeks. History of lumbar surgery on 05/04/2020. History of inflammatory bowel disease. EXAM: CT ABDOMEN AND PELVIS WITHOUT CONTRAST TECHNIQUE: Multidetector CT imaging of the abdomen and pelvis was performed following the standard protocol without IV contrast. COMPARISON:  09/05/2017 CT FINDINGS: Please note that parenchymal abnormalities may be missed without intravenous contrast. Lower chest: No acute abnormality Hepatobiliary: Hepatic steatosis again identified without focal hepatic abnormality. The patient is status post cholecystectomy. No biliary dilatation. Pancreas: Unremarkable Spleen: Unremarkable Adrenals/Urinary Tract: The kidneys, adrenal glands and bladder are unremarkable. There is no evidence of hydronephrosis or urinary calculi. Stomach/Bowel: Stomach is within normal limits. Appendix appears normal. No evidence of bowel wall thickening, distention, or inflammatory changes. Vascular/Lymphatic: Aortic atherosclerosis. No enlarged abdominal or pelvic lymph nodes. Reproductive: There is heterogeneity of the central uterus noted which may represent endometrial thickening/mass. The adnexal regions are unremarkable. Other: No ascites, focal collection or pneumoperitoneum. Musculoskeletal: Posterior fusion changes at L4-L5 noted. A thoracic cord  stimulator is identified. No acute or suspicious bony findings are noted. IMPRESSION: 1. No evidence of acute abnormality. No bowel abnormalities identified. 2. Heterogeneity of the central uterus which may represent endometrial thickening/mass. Pelvic ultrasound is recommended for further evaluation. 3. Hepatic steatosis. 4. Aortic Atherosclerosis (ICD10-I70.0). Electronically Signed   By: Margarette Canada M.D.   On: 05/22/2020 10:13    Impression: 1.  Persistent nausea, without overt vomiting.  Essentially no oral intake. 2.  Altered bowel function, characterized by frequent, soft/watery bowel movements 3.  Acute kidney injury, presumably due to dehydration related to poor oral intake and diarrhea, improving in hospital 4.  History of "Crohn's disease," without ever having had clinically significant colonoscopic or radiographic abnormalities. 5.  History of periodic abdominal pain, nausea, and diarrhea as outpatient, cause obscure, but when this occurred most recently about a year and a half ago, it seemed to respond to a boost in her prednisone dose (patient is maintained on prednisone 10 mg daily) 6.  Reported hemorrhoidal irritation and discomfort.  Plan: Endoscopic and sigmoidoscopic evaluation today, with random mucosal biopsies, to see if we can find a source of the patient's symptoms such as bile reflux, ulcer disease (unlikely, since on PPI therapy as an outpatient), lymphocytic enteritis, microscopic colitis, flare of "Crohn's" (doubt, see above discussion, especially with negative CT).  The rationale and risks of the procedure were reviewed with the patient and she is agreeable to proceed.  Treat hemorrhoids topically if present confirmed at time of today's sigmoidoscopy.  Try to get quantitation of the frequency, appearance, and amount of all bowel movements.  If the patient is confirmed to have ongoing GI issues, consider an empiric trial of a boost in her prednisone dose, as has helped her  in the past.     LOS: 2 days   [place]  05/24/2020, 9:12 AM   Pager [phone] If no answer or after 5 PM call [phone]

## 2020-05-24 NOTE — Op Note (Signed)
[facility] Patient Name: Teresa Lee Procedure Date : 05/24/2020 MRN: [id number] Attending MD: Ronald Lobo , MD Date of Birth: 03-30-1960 CSN: [id number] Age: 60 Admit Type: Inpatient Procedure:                Colonoscopy Indications:              Last colonoscopy: September 2020 (Dr. Michail Sermon),                            Clinically significant diarrhea of unexplained                            origin. Also, perianal pain. Providers:                Ronald Lobo, MD, Ladona Ridgel, Technician,                            Viann Fish, CRNA, Josie Dixon, RN Referring MD:              Medicines:                Monitored Anesthesia Care Complications:            No immediate complications. Estimated Blood Loss:     Estimated blood loss was minimal. Procedure:                Pre-Anesthesia Assessment:                           - Prior to the procedure, a History and Physical                            was performed, and patient medications and                            allergies were reviewed. The patient's tolerance of                            previous anesthesia was also reviewed. The risks                            and benefits of the procedure and the sedation                            options and risks were discussed with the patient.                            All questions were answered, and informed consent                            was obtained. Prior Anticoagulants: The patient has                            taken no previous anticoagulant or antiplatelet                            agents.  ASA Grade Assessment: III - A patient with                            severe systemic disease. After reviewing the risks                            and benefits, the patient was deemed in                            satisfactory condition to undergo the procedure.                           - Prior to the procedure, a History and Physical                             was performed, and patient medications and                            allergies were reviewed. The patient's tolerance of                            previous anesthesia was also reviewed. The risks                            and benefits of the procedure and the sedation                            options and risks were discussed with the patient.                            All questions were answered, and informed consent                            was obtained. Prior Anticoagulants: The patient has                            taken no previous anticoagulant or antiplatelet                            agents. ASA Grade Assessment: III - A patient with                            severe systemic disease. After reviewing the risks                            and benefits, the patient was deemed in                            satisfactory condition to undergo the procedure.                           After obtaining informed consent, the colonoscope  was passed under direct vision. Throughout the                            procedure, the patient's blood pressure, pulse, and                            oxygen saturations were monitored continuously. The                            PCF-H190DL ([id number] Olympus pediatric colonoscope                            was introduced through the anus and advanced to the                            the terminal ileum. After obtaining informed                            consent, the colonoscope was passed under direct                            vision. Throughout the procedure, the patient's                            blood pressure, pulse, and oxygen saturations were                            monitored continuously.The colonoscopy was                            performed without difficulty. The patient tolerated                            the procedure well. The quality of the bowel                            preparation was adequate. The  colonoscopy was                            performed without difficulty. The patient tolerated                            the procedure well. The quality of the bowel                            preparation was excellent even though this was done                            unprepped. This procedure had been planned and                            consented as an unprepped flexible sigmoidoscopy,  but because the prep was sufficient for a more                            complete exam, because an explanation for the                            patient's symptoms was not evident distally, and                            because advancement was without difficulty and the                            patient was tolerating the procedure well, I                            elected to carry out a complete colonoscopy. Scope In: 11:49:05 AM Scope Out: 12:02:11 PM Scope Withdrawal Time: 0 hours 8 minutes 35 seconds  Total Procedure Duration: 0 hours 13 minutes 6 seconds  Findings:      There was moderate perianal dermatitis, consistent with the patient's       prolonged diarrhea, but no prolapsed hemorrhoids were seen.      A 2 mm polyp was found in the cecum adjacent to the appendiceal orifice.       The polyp was sessile. Biopsies were taken with a cold forceps for       histology. Estimated blood loss was minimal.      The exam was otherwise normal throughout the examined colon. There was       perhaps some slight decrease in vascularity in the sigmoid region       without any overt inflammatory changes.      There is no endoscopic evidence of diverticula, inflammation or mass in       the entire colon.      The terminal ileum appeared normal.      The retroflexed view of the distal rectum and anal verge was normal and       showed no anal or rectal abnormalities. Reinspection of the rectum       showed no additional findings.      No significant internal or external  hemorrhoids or fissures to account       for patient's perianal pain were seen.      Biopsies for histology were taken with a cold forceps from the entire       colon (except the rectum) for evaluation of possible microscopic colitis       as the source of the patient's diarrhea. Biopsies from the sigmoid       region were placed in a separate jar because in the remote past there       had been some inflammatory changes there, and because of the subtle       possible mucosal changes observed today. Impression:               - Perianal dermatitis.                           - One 2 mm polyp in the cecum. Biopsied. Otherwise  essentially normal colonoscopy with no obvious                            cause for diarrhea seen.                           - The examined portion of the ileum was normal.                           - The distal rectum and anal verge are normal on                            retroflexion view.                           - Biopsies were taken with a cold forceps from the                            entire colon for evaluation of microscopic colitis. Recommendation:           - Await pathology results.                           - If the pathology report reveals adenomatous                            tissue, then repeat the colonoscopy for                            surveillance in 7 years.                           - If the pathology report reveals no adenomatous                            tissue, then repeat the colonoscopy for                            surveillance in 10 years.                           - Imodium 2 tablets PO QID.                           - Topical cream to perianal area. Procedure Code(s):        --- Professional ---                           [phone], Colonoscopy, flexible; with biopsy, single                            or multiple Diagnosis Code(s):        --- Professional ---                           K63.5, Polyp of colon  R19.7, Diarrhea, unspecified CPT copyright 2019 American Medical Association. All rights reserved. The codes documented in this report are preliminary and upon coder review may  be revised to meet current compliance requirements. Ronald Lobo, MD 05/24/2020 12:42:43 PM This report has been signed electronically. Number of Addenda: 0

## 2020-05-24 NOTE — Anesthesia Postprocedure Evaluation (Signed)
Anesthesia Post Note  Patient: Teresa Lee  Procedure(s) Performed: ESOPHAGOGASTRODUODENOSCOPY (EGD) WITH PROPOFOL (N/A ) BIOPSY COLONOSCOPY WITH PROPOFOL (N/A )     Patient location during evaluation: PACU Anesthesia Type: MAC Level of consciousness: awake and alert and oriented Pain management: pain level controlled Vital Signs Assessment: post-procedure vital signs reviewed and stable Respiratory status: spontaneous breathing, nonlabored ventilation and respiratory function stable Cardiovascular status: blood pressure returned to baseline Postop Assessment: no apparent nausea or vomiting Anesthetic complications: no   No complications documented.  Last Vitals:  Vitals:   05/24/20 1219 05/24/20 1241  BP: (!) 141/72 (!) 115/97  Pulse: 79 78  Resp: 14 16  Temp:  36.6 C  SpO2: 100% 100%    Last Pain:  Vitals:   05/24/20 1241  TempSrc: Oral  PainSc: 0-No pain                 Brennan Bailey

## 2020-05-24 NOTE — Progress Notes (Signed)
Patient assisted to [facility], one episode of 200 ml urine/diarrhea mix brown in color at this time

## 2020-05-24 NOTE — Progress Notes (Signed)
PROGRESS NOTE  Teresa Lee ZLD:[id number] DOB: 12-Oct-1960 DOA: 05/22/2020 PCP: [name], MD  HPI/Recap of past 24 hours: Teresa Lee is a 60 y.o. female with medical history significant of HTN, HLD, myasthenia gravis, fibromyalgia, asthma, restless leg syndrome, and Crohn's disease who presents with complaints of nausea and diarrhea over the last 2 weeks.  Most of history is obtained from the patient's husband who is present at bedside at her request.  Patient had L4-5 TLIF and right micro discectomy of L5-S1 on 2/15 with Dr. [name] of neurosurgery.  Patient stayed hospitalized for 3 days following surgery and was discharged home.  She has been taking hydrocodone-acetaminophen 10 mg-325 mg up to 5 per daily for back pain.  Over the last 2 weeks patient has complained of persistent nausea and has had no appetite.  She reports everything taste and smells horrible.  Patient has been taking Zofran almost around-the-clock.  Denies any vomiting, but she has had dry heaves.  Initially following the surgery she had been constipated, but after taking every laxatives and enemas she was finally able to have a bowel movement 7 days ago.  However, since that time patient has had persistent diarrhea.  Associated symptoms include possibly 12 pound weight loss, decreased urine output, intermittent hot/cold spells, constant  hiccups, issues with her memory, intermittently disoriented at times, and using a walker to get around when previously she could ambulate without assistance.    ED Course: On admission to the emergency department patient was seen to be afebrile relatively stable vital signs.  Labs significant for WBC 21.5, platelets 460, potassium 3.3, CO2 16, BUN 92, creatinine 5.47, anion gap 20, and lipase 116.  COVID-19 screening was negative.  CT scan of the abdomen and pelvis only noted of the heterogeneity of the central uterus which may represent endometrial thickening/mass.  Patient had  been ordered 2 L of normal saline IV fluids, Imodium, and antiemetics.  05/24/20: Patient was seen and examined at bedside.  She reports persistent nausea and diarrhea.  She was seen by GI she had an EGD and colonoscopy which did not explain the reason for her symptomatology.  GI recommended Imodium 4 times daily.  Assessment/Plan: Principal Problem:   ARF (acute renal failure) (HCC) Active Problems:   Essential hypertension   Asthma   Leukocytosis   Hypokalemia   Myasthenia gravis (HCC)   Crohn disease (HCC)   GERD (gastroesophageal reflux disease)   Anxiety disorder   Chronic pain disorder   Diarrhea   Nausea  Acute intractable nausea and diarrhea, unclear etiology She states her symptoms do not feel like her typical flare from Crohn's disease.  IV antiemetics in place as needed. No CT evidence of Crohn's flare Rule out infective etiology. EGD and colonoscopy done on 05/24/2020 did not explain her symptomatology. C. difficile PCR negative. GI panel in process.  Nonoliguric AKI likely prerenal in the setting of dehydration from diarrhea and poor oral intake from intractable nausea. AKI is improving, creatinine is downtrending 3.06 from 5. Baseline creatinine appears to be 0.9 with GFR greater than 60 Presented with creatinine greater than 5 with BUN of 92. Continue IV fluid Continue to monitor urine output Continue to avoid nephrotoxic agents Daily renal panel.  Refractory hypokalemia likely from GI losses with diarrhea. Presented with serum potassium of 2.3 Serum potassium improving to 3.1, continue to replete. Continue daily replacement with ongoing diarrhea  Resolved post repletion: Hypomagnesemia likely from GI losses with diarrhea. Serum magnesium  1.1 Serum magnesium improved to 2.7.  Leukocytosis, rule out infective process Follow GI panel  C. difficile PCR was negative No evidence of acute abnormality from CT abdomen and pelvis without contrast. WBC is  downtrending  Hypophosphatemia likely in the setting of renal failure Phosphorus 5.2 Repeat level in the morning.  Crohn's disease Continue home regimen. She is on mesalamine and prednisone  Hyperlipidemia Continue home Crestor.  Restless leg syndrome Continue home Requip.  Chronic anxiety Continue home regimen  GERD Continue home PPI twice daily.  Aortic atherosclerosis seen on CT scan. Continue home Crestor     Code Status: Full code.  Family Communication: None at bedside.  Disposition Plan: Likely discharge to home on 05/25/2020 or when diarrhea has resolved.   Consultants:  None.  Procedures:  None.  Antimicrobials:  None.  DVT prophylaxis: SQ heparin 3 times daily.  Status is: Inpatient    Dispo:  Patient From: Home  Planned Disposition: Home  Anticipated discharge date: 05/25/2020 or when diarrhea has improved.  Medically stable for discharge: No, ongoing management of diarrhea.          Objective: Vitals:   05/24/20 1056 05/24/20 1209 05/24/20 1219 05/24/20 1241  BP: (!) 154/70 127/71 (!) 141/72 (!) 115/97  Pulse: 84 78 79 78  Resp: 14 15 14 16   Temp: 97.9 F (36.6 C) 98.4 F (36.9 C)  97.8 F (36.6 C)  TempSrc: Oral Oral  Oral  SpO2: 100% 100% 100% 100%  Weight: 66.3 kg     Height: 5' (1.524 m)       Intake/Output Summary (Last 24 hours) at 05/24/2020 1637 Last data filed at 05/24/2020 1018 Gross per 24 hour  Intake 3 ml  Output 1000 ml  Net -997 ml   Filed Weights   05/23/20 0047 05/24/20 0001 05/24/20 1056  Weight: 66.9 kg 66.3 kg 66.3 kg    Exam:  . General: 60 y.o. year-old female well-developed well-nourished no acute distress patient alert and oriented x3.   . Cardiovascular: Regular rate and rhythm no rubs or gallops. Marland Kitchen Respiratory: Clear to auscultation no wheeze or rales. . Abdomen: Soft nontender no bowel sounds present. . Musculoskeletal: No lower extremity edema bilaterally.   . Skin: No ulcerative  lesions noted. Marland Kitchen Psychiatry: Mood is appropriate for condition and setting.   Data Reviewed: CBC: Recent Labs  Lab 05/22/20 0800 05/23/20 0247 05/24/20 0802  WBC 21.5* 14.9* 12.4*  NEUTROABS 15.4*  --   --   HGB 12.9 10.0* 10.1*  HCT 37.4 27.9* 29.3*  MCV 93.7 90.6 93.0  PLT 460* 269 606   Basic Metabolic Panel: Recent Labs  Lab 05/22/20 0800 05/23/20 0247 05/23/20 0514 05/23/20 2258 05/24/20 0802  NA 135 136  --   --  139  K 3.3* 2.3* 2.3* 2.9* 3.1*  CL 99 101  --   --  108  CO2 16* 19*  --   --  17*  GLUCOSE 163* 103*  --   --  105*  BUN 92* 67*  --   --  43*  CREATININE 5.47* 4.16*  --   --  3.06*  CALCIUM 10.2 8.5*  --   --  8.7*  MG  --  1.1*  --  3.3* 2.7*  PHOS  --  5.2*  --   --   --    GFR: Estimated Creatinine Clearance: 17 mL/min (A) (by C-G formula based on SCr of 3.06 mg/dL (H)). Liver Function Tests: Recent Labs  Lab 05/22/20 0800  AST 13*  ALT 7  ALKPHOS 71  BILITOT 0.5  PROT 9.0*  ALBUMIN 4.4   Recent Labs  Lab 05/22/20 0800  LIPASE 116*   No results for input(s): AMMONIA in the last 168 hours. Coagulation Profile: No results for input(s): INR, PROTIME in the last 168 hours. Cardiac Enzymes: No results for input(s): CKTOTAL, CKMB, CKMBINDEX, TROPONINI in the last 168 hours. BNP (last 3 results) No results for input(s): PROBNP in the last 8760 hours. HbA1C: No results for input(s): HGBA1C in the last 72 hours. CBG: No results for input(s): GLUCAP in the last 168 hours. Lipid Profile: No results for input(s): CHOL, HDL, LDLCALC, TRIG, CHOLHDL, LDLDIRECT in the last 72 hours. Thyroid Function Tests: No results for input(s): TSH, T4TOTAL, FREET4, T3FREE, THYROIDAB in the last 72 hours. Anemia Panel: No results for input(s): VITAMINB12, FOLATE, FERRITIN, TIBC, IRON, RETICCTPCT in the last 72 hours. Urine analysis:    Component Value Date/Time   COLORURINE AMBER (A) 02/17/2019 0305   APPEARANCEUR CLOUDY (A) 02/17/2019 0305    LABSPEC >1.030 (H) 02/17/2019 0305   PHURINE 5.5 02/17/2019 0305   GLUCOSEU NEGATIVE 02/17/2019 0305   HGBUR MODERATE (A) 02/17/2019 0305   BILIRUBINUR SMALL (A) 02/17/2019 0305   KETONESUR NEGATIVE 02/17/2019 0305   PROTEINUR 100 (A) 02/17/2019 0305   NITRITE NEGATIVE 02/17/2019 0305   LEUKOCYTESUR NEGATIVE 02/17/2019 0305   Sepsis Labs: @LABRCNTIP (procalcitonin:4,lacticidven:4)  ) Recent Results (from the past 240 hour(s))  SARS Coronavirus 2 by RT PCR (hospital order, performed in [place] hospital lab)     Status: None   Collection Time: 05/22/20  9:50 AM  Result Value Ref Range Status   SARS Coronavirus 2 NEGATIVE NEGATIVE Final    Comment: (NOTE) SARS-CoV-2 target nucleic acids are NOT DETECTED.  The SARS-CoV-2 RNA is generally detectable in upper and lower respiratory specimens during the acute phase of infection. The lowest concentration of SARS-CoV-2 viral copies this assay can detect is 250 copies / mL. A negative result does not preclude SARS-CoV-2 infection and should not be used as the sole basis for treatment or other patient management decisions.  A negative result may occur with improper specimen collection / handling, submission of specimen other than nasopharyngeal swab, presence of viral mutation(s) within the areas targeted by this assay, and inadequate number of viral copies (<250 copies / mL). A negative result must be combined with clinical observations, patient history, and epidemiological information.  Fact Sheet for Patients:   StrictlyIdeas.no  Fact Sheet for Healthcare Providers: BankingDealers.co.za  This test is not yet approved or  cleared by the [place] FDA and has been authorized for detection and/or diagnosis of SARS-CoV-2 by FDA under an Emergency Use Authorization (EUA).  This EUA will remain in effect (meaning this test can be used) for the duration of the COVID-19 declaration under  Section 564(b)(1) of the Act, 21 U.S.C. section 360bbb-3(b)(1), unless the authorization is terminated or revoked sooner.  Performed at [facility], [address] [place], [name] [address]   Gastrointestinal Panel by PCR , Stool     Status: None   Collection Time: 05/23/20  4:54 PM   Specimen: Stool  Result Value Ref Range Status   Campylobacter species NOT DETECTED NOT DETECTED Final   Plesimonas shigelloides NOT DETECTED NOT DETECTED Final   Salmonella species NOT DETECTED NOT DETECTED Final   Yersinia enterocolitica NOT DETECTED NOT DETECTED Final   Vibrio species NOT DETECTED NOT DETECTED  Final   Vibrio cholerae NOT DETECTED NOT DETECTED Final   Enteroaggregative E coli (EAEC) NOT DETECTED NOT DETECTED Final   Enteropathogenic E coli (EPEC) NOT DETECTED NOT DETECTED Final   Enterotoxigenic E coli (ETEC) NOT DETECTED NOT DETECTED Final   Shiga like toxin producing E coli (STEC) NOT DETECTED NOT DETECTED Final   Shigella/Enteroinvasive E coli (EIEC) NOT DETECTED NOT DETECTED Final   Cryptosporidium NOT DETECTED NOT DETECTED Final   Cyclospora cayetanensis NOT DETECTED NOT DETECTED Final   Entamoeba histolytica NOT DETECTED NOT DETECTED Final   Giardia lamblia NOT DETECTED NOT DETECTED Final   Adenovirus F40/41 NOT DETECTED NOT DETECTED Final   Astrovirus NOT DETECTED NOT DETECTED Final   Norovirus GI/GII NOT DETECTED NOT DETECTED Final   Rotavirus A NOT DETECTED NOT DETECTED Final   Sapovirus (I, II, IV, and V) NOT DETECTED NOT DETECTED Final    Comment: Performed at [facility], [address] [place], [name] [address]  C Difficile Quick Screen w PCR reflex     Status: None   Collection Time: 05/23/20  4:54 PM   Specimen: STOOL  Result Value Ref Range Status   C Diff antigen NEGATIVE NEGATIVE Final   C Diff toxin NEGATIVE NEGATIVE Final   C Diff interpretation No C. difficile detected.  Final    Comment: Performed at [facility], [place] [address], [place], [place] [address]      Studies: No results found.  Scheduled Meds: . ALPRAZolam  0.5 mg Oral QHS  . Gerhardt's butt cream   Topical TID  . heparin  5,000 Units Subcutaneous Q8H  . loperamide  4 mg Oral QID  . Mesalamine  400 mg Oral BID  . mometasone-formoterol  2 puff Inhalation BID  . mycophenolate  1,000 mg Oral BID  . pantoprazole  40 mg Oral BID  . potassium chloride  40 mEq Oral BID  . predniSONE  10 mg Oral q morning  . progesterone  100 mg Oral QHS  . pyridostigmine  30 mg Oral Daily  . rOPINIRole  1 mg Oral QHS,MR X 1  . rosuvastatin  10 mg Oral Daily  . sodium chloride flush  3 mL Intravenous Q12H    Continuous Infusions: . sodium chloride Stopped (05/24/20 1211)  . 0.9 % NaCl with KCl 40 mEq / L 75 mL/hr at 05/24/20 0013  . chlorproMAZINE (THORAZINE) IV 12.5 mg (05/24/20 1018)     LOS: 2 days     Kayleen Memos, MD [facility] Pager [phone]  If 7PM-7AM, please contact night-coverage [email or web address] Password [facility] 05/24/2020, 4:37 PM

## 2020-05-24 NOTE — Op Note (Signed)
[facility] Patient Name: Teresa Lee Procedure Date : 05/24/2020 MRN: [id number] Attending MD: Ronald Lobo , MD Date of Birth: 1960/09/29 CSN: [id number] Age: 60 Admit Type: Inpatient Procedure:                Upper GI endoscopy Indications:              Diarrhea, Nausea Providers:                Ronald Lobo, MD, Ladona Ridgel, Technician,                            Viann Fish, CRNA, Josie Dixon, RN Referring MD:              Medicines:                Monitored Anesthesia Care Complications:            No immediate complications. Estimated Blood Loss:     Estimated blood loss was minimal. Procedure:                Pre-Anesthesia Assessment:                           - Prior to the procedure, a History and Physical                            was performed, and patient medications and                            allergies were reviewed. The patient's tolerance of                            previous anesthesia was also reviewed. The risks                            and benefits of the procedure and the sedation                            options and risks were discussed with the patient.                            All questions were answered, and informed consent                            was obtained. Prior Anticoagulants: The patient has                            taken no previous anticoagulant or antiplatelet                            agents. ASA Grade Assessment: III - A patient with                            severe systemic disease. After reviewing the risks  and benefits, the patient was deemed in                            satisfactory condition to undergo the procedure.                           After obtaining informed consent, the endoscope was                            passed under direct vision. Throughout the                            procedure, the patient's blood pressure, pulse, and                             oxygen saturations were monitored continuously. The                            GIF-H190 ([id number] Olympus gastroscope was                            introduced through the mouth, and advanced to the                            second part of duodenum. The upper GI endoscopy was                            accomplished without difficulty. The patient                            tolerated the procedure well. Scope In: Scope Out: Findings:      The examined esophagus was normal.      The entire examined stomach was normal. No significant gastric residual,       bile reflux, or pyloric stenosis.      Antral biopsies were taken with a cold forceps for histology. Estimated       blood loss was minimal.      The cardia and gastric fundus were normal on retroflexion.      The examined duodenum was normal. Impression:               - Normal esophagus.                           - Normal stomach. Biopsied.                           - Normal examined duodenum.                           - No cause for nausea endoscopically evident. Recommendation:           - Await pathology results.                           - Await pathology results.                           -  Perform a colonoscopy today. Procedure Code(s):        --- Professional ---                           [phone], Esophagogastroduodenoscopy, flexible,                            transoral; with biopsy, single or multiple Diagnosis Code(s):        --- Professional ---                           R19.7, Diarrhea, unspecified                           R11.0, Nausea CPT copyright 2019 [employer]. All rights reserved. The codes documented in this report are preliminary and upon coder review may  be revised to meet current compliance requirements. Ronald Lobo, MD 05/24/2020 12:12:19 PM This report has been signed electronically. Number of Addenda: 0

## 2020-05-24 NOTE — Transfer of Care (Signed)
Immediate Anesthesia Transfer of Care Note  Patient: Teresa Lee  Procedure(s) Performed: ESOPHAGOGASTRODUODENOSCOPY (EGD) WITH PROPOFOL (N/A ) BIOPSY COLONOSCOPY WITH PROPOFOL (N/A )  Patient Location: Endoscopy Unit  Anesthesia Type:MAC  Level of Consciousness: awake, alert  and oriented  Airway & Oxygen Therapy: Patient Spontanous Breathing and Patient connected to nasal cannula oxygen  Post-op Assessment: Report given to RN and Post -op Vital signs reviewed and stable  Post vital signs: Reviewed and stable  Last Vitals:  Vitals Value Taken Time  BP 127/71 05/24/20 1209  Temp    Pulse 86 05/24/20 1212  Resp 19 05/24/20 1212  SpO2 99 % 05/24/20 1212  Vitals shown include unvalidated device data.  Last Pain:  Vitals:   05/24/20 1209  TempSrc:   PainSc: 0-No pain         Complications: No complications documented.

## 2020-05-24 NOTE — Anesthesia Procedure Notes (Signed)
Procedure Name: MAC Date/Time: 05/24/2020 11:30 AM Performed by: Griffin Dakin, CRNA Pre-anesthesia Checklist: Patient identified, Emergency Drugs available, Suction available and Patient being monitored Patient Re-evaluated:Patient Re-evaluated prior to induction Oxygen Delivery Method: Nasal cannula Induction Type: IV induction Placement Confirmation: positive ETCO2 and breath sounds checked- equal and bilateral Dental Injury: Teeth and Oropharynx as per pre-operative assessment

## 2020-05-24 NOTE — Anesthesia Preprocedure Evaluation (Addendum)
Anesthesia Evaluation  Patient identified by MRN, date of birth, ID band Patient awake    Reviewed: Allergy & Precautions, NPO status , Patient's Chart, lab work & pertinent test results  History of Anesthesia Complications Negative for: history of anesthetic complications  Airway Mallampati: II  TM Distance: >3 FB Neck ROM: Full    Dental no notable dental hx.    Pulmonary asthma ,    Pulmonary exam normal        Cardiovascular hypertension, Pt. on medications Normal cardiovascular exam     Neuro/Psych Anxiety Myasthenia gravis  Spinal cord stimulator    GI/Hepatic Neg liver ROS, GERD  Controlled and Medicated,  Endo/Other  negative endocrine ROS  Renal/GU ARFRenal disease (Cr 3.06)  negative genitourinary   Musculoskeletal  (+) Arthritis , Fibromyalgia -, narcotic dependent  Abdominal   Peds  Hematology negative hematology ROS (+)   Anesthesia Other Findings Day of surgery medications reviewed with patient.  Reproductive/Obstetrics negative OB ROS                            Anesthesia Physical Anesthesia Plan  ASA: III  Anesthesia Plan: MAC   Post-op Pain Management:    Induction:   PONV Risk Score and Plan: Treatment may vary due to age or medical condition and Propofol infusion  Airway Management Planned: Natural Airway and Nasal Cannula  Additional Equipment:   Intra-op Plan:   Post-operative Plan:   Informed Consent: I have reviewed the patients History and Physical, chart, labs and discussed the procedure including the risks, benefits and alternatives for the proposed anesthesia with the patient or authorized representative who has indicated his/her understanding and acceptance.       Plan Discussed with: CRNA  Anesthesia Plan Comments:         Anesthesia Quick Evaluation

## 2020-05-24 NOTE — Progress Notes (Addendum)
Patient's endoscopy unrevealing for source of nausea.  Patient's colonoscopy unrevealing for source of diarrhea.  Plan  1.  Await random biopsies; perhaps they will give [name] a clue as to the source of her symptoms 2.  I have requested that the nurses document frequency and severity of diarrhea 3.  Imodium (scheduled dose) for diarrhea 4.  Topical cream for perianal irritation  Cleotis Nipper, M.D. Pager [phone] If no answer or after 5 PM call [phone]

## 2020-05-24 NOTE — Progress Notes (Signed)
NIF -35 with good effort.

## 2020-05-24 NOTE — Progress Notes (Signed)
NIF -40

## 2020-05-25 LAB — RENAL FUNCTION PANEL
Albumin: 2.8 g/dL — ABNORMAL LOW (ref 3.5–5.0)
Anion gap: 12 (ref 5–15)
BUN: 31 mg/dL — ABNORMAL HIGH (ref 6–20)
CO2: 16 mmol/L — ABNORMAL LOW (ref 22–32)
Calcium: 8.7 mg/dL — ABNORMAL LOW (ref 8.9–10.3)
Chloride: 112 mmol/L — ABNORMAL HIGH (ref 98–111)
Creatinine, Ser: 2.55 mg/dL — ABNORMAL HIGH (ref 0.44–1.00)
GFR, Estimated: 21 mL/min — ABNORMAL LOW (ref >60)
Glucose, Bld: 86 mg/dL (ref 70–99)
Phosphorus: 2.6 mg/dL (ref 2.5–4.6)
Potassium: 3.8 mmol/L (ref 3.5–5.1)
Sodium: 140 mmol/L (ref 135–145)

## 2020-05-25 LAB — SURGICAL PATHOLOGY

## 2020-05-25 MED ORDER — SODIUM CHLORIDE 0.9 % IV SOLN: INTRAVENOUS | Status: AC

## 2020-05-25 MED ORDER — BOOST / RESOURCE BREEZE PO LIQD CUSTOM
1.0000 | Freq: Three times a day (TID) | ORAL | Status: DC
  Administered 2020-05-25 – 2020-05-27 (×4): 1 via ORAL

## 2020-05-25 MED ORDER — SODIUM BICARBONATE 650 MG PO TABS
1300.0000 mg | ORAL_TABLET | Freq: Two times a day (BID) | ORAL | Status: DC
  Administered 2020-05-25 – 2020-05-28 (×7): 1300 mg via ORAL
  Filled 2020-05-25 (×8): qty 2

## 2020-05-25 MED ORDER — ADULT MULTIVITAMIN W/MINERALS CH
1.0000 | ORAL_TABLET | Freq: Every day | ORAL | Status: DC
  Administered 2020-05-25 – 2020-05-28 (×4): 1 via ORAL
  Filled 2020-05-25 (×4): qty 1

## 2020-05-25 NOTE — Progress Notes (Signed)
Initial Nutrition Assessment  DOCUMENTATION CODES:   Not applicable  INTERVENTION:   -Boost Breeze po TID, each supplement provides 250 kcal and 9 grams of protein -MVI with minerals daily  NUTRITION DIAGNOSIS:   Inadequate oral intake related to nausea as evidenced by meal completion < 50%,per patient/family report.  GOAL:   Patient will meet greater than or equal to 90% of their needs  MONITOR:   PO intake,Supplement acceptance,Labs,Weight trends,Skin,I & O's  REASON FOR ASSESSMENT:   Rounds    ASSESSMENT:   Teresa Lee is a 60 y.o. female with medical history significant of HTN, HLD, myasthenia gravis, fibromyalgia, asthma, restless leg syndrome, and Crohn's disease who presents with complaints of nausea and diarrhea over the last 2 weeks  Pt admitted nausea and diarrhea.   3/7- s/p colonoscopy- revealed perianal dermatitis (biopsied); s/p EGD- stomach biopsied  Reviewed I/O's: +947 ml x 24 hours and+416 ml since admission  UOP: 700 ml x 24 hours  Spoke with pt at bedside, who reports a general decline in health over the past 3 weeks after undergoing laminectomy. Pt shares that she experienced constipation post-operatively and "may have overdid it on the laxatives". Per pt, she has been experiencing ongoing diarrhea and nausea, which has caused her to eat very little. Food odors and the thought of food exacerbate this discomfort; her family tried to "force" her to eat PTA but was able to only take bites and sips. Pt pt, she has mostly been consuming liquids during this time. Noted meal completion 50%.   Pt endorses a 10-15# wt loss over the past 3 weeks. Reviewed wt hx; pt has experienced a 5.5% wt loss over the past month, which is significant for time frame.   Pt shares she has not had diarrhea since early this morning and the medications have been helping. She still has nausea, but is looking forward to trying breakfast cereal. Reviewed menu items and  encouraged pt to choose foods to less odors to help combat nausea. Pt also amenable to Boost Breeze.   Medications reviewed and include prednisone.   Labs reviewed: Mg: 2.7.   NUTRITION - FOCUSED PHYSICAL EXAM:  Flowsheet Row Most Recent Value  Orbital Region No depletion  Upper Arm Region No depletion  Thoracic and Lumbar Region No depletion  Buccal Region No depletion  Temple Region No depletion  Clavicle Bone Region No depletion  Clavicle and Acromion Bone Region No depletion  Scapular Bone Region No depletion  Dorsal Hand No depletion  Patellar Region No depletion  Anterior Thigh Region No depletion  Posterior Calf Region No depletion  Edema (RD Assessment) Mild  Hair Reviewed  Eyes Reviewed  Mouth Reviewed  Skin Reviewed  Nails Reviewed       Diet Order:   Diet Order            DIET SOFT Room service appropriate? Yes; Fluid consistency: Thin  Diet effective now                 EDUCATION NEEDS:   Education needs have been addressed  Skin:  Skin Assessment: Skin Integrity Issues: Skin Integrity Issues:: Incisions Incisions: closed back  Last BM:  05/25/20  Height:   Ht Readings from Last 1 Encounters:  05/24/20 5' (1.524 m)    Weight:   Wt Readings from Last 1 Encounters:  05/25/20 66.7 kg    Ideal Body Weight:  45.5 kg  BMI:  Body mass index is 28.72 kg/m.  Estimated Nutritional  Needs:   Kcal:  1800-2000  Protein:  90-105 grams  Fluid:  > 1.8 L    Loistine Chance, RD, LDN, [place] Registered Dietitian II Certified Diabetes Care and Education Specialist Please refer to [facility] for RD and/or RD on-call/weekend/after hours pager

## 2020-05-25 NOTE — Progress Notes (Signed)
NIF -40

## 2020-05-25 NOTE — Progress Notes (Signed)
C/o nausea . Second time requesting nausea medication. See EMAR. States the Phenergan is only helping, "just a tiny bit."  2 Episodes of diarrhea/urine mix. See measured output in charting. Continuing to monitor. Denies food at this time although diet changed to soft diet.

## 2020-05-25 NOTE — Progress Notes (Signed)
PROGRESS NOTE  Teresa Lee WNI:[id number] DOB: 17-Jan-1961 DOA: 05/22/2020 PCP: [name], MD  HPI/Recap of past 24 hours: Teresa Lee is a 60 y.o. female with medical history significant of HTN, HLD, myasthenia gravis, fibromyalgia, asthma, restless leg syndrome, and Crohn's disease who presents with complaints of nausea and diarrhea over the last 2 weeks.  Most of history is obtained from the patient's husband who is present at bedside at her request.  Patient had L4-5 TLIF and right micro discectomy of L5-S1 on 2/15 with Dr. [name] of neurosurgery.  Patient stayed hospitalized for 3 days following surgery and was discharged home.  She has been taking hydrocodone-acetaminophen 10 mg-325 mg up to 5 per daily for back pain.  Over the last 2 weeks patient has complained of persistent nausea and has had no appetite.  She reports everything taste and smells horrible.  Patient has been taking Zofran almost around-the-clock.  Denies any vomiting, but she has had dry heaves.  Initially following the surgery she had been constipated, but after taking every laxatives and enemas she was finally able to have a bowel movement 7 days ago.  However, since that time patient has had persistent diarrhea.  Associated symptoms include possibly 12 pound weight loss, decreased urine output, intermittent hot/cold spells, constant  hiccups, issues with her memory, intermittently disoriented at times, and using a walker to get around when previously she could ambulate without assistance.    ED Course: On admission to the emergency department patient was seen to be afebrile relatively stable vital signs.  Labs significant for WBC 21.5K, platelets 460, potassium 3.3, CO2 16, BUN 92, creatinine 5.47, anion gap 20, and lipase 116.  COVID-19 screening was negative.  CT scan of the abdomen and pelvis only noted of the heterogeneity of the central uterus which may represent endometrial thickening/mass.   EGD and  colonoscopy from 05/24/20 was nonrevealing.  C. difficile PCR and GI panel negative.  GI recommended Imodium 4 times daily with improvement of her symptomatology.  05/25/20:  Seen and examined reports persistent nausea and diarrhea.  Assessment/Plan: Principal Problem:   ARF (acute renal failure) (HCC) Active Problems:   Essential hypertension   Asthma   Leukocytosis   Hypokalemia   Myasthenia gravis (HCC)   Crohn disease (HCC)   GERD (gastroesophageal reflux disease)   Anxiety disorder   Chronic pain disorder   Diarrhea   Nausea  Acute intractable nausea and diarrhea, unclear etiology No CT evidence of Crohn's flare She states her symptoms do not feel like her typical flare from Crohn's disease.  IV antiemetics in place as needed. EGD and colonoscopy from 05/24/20 was nonrevealing.  C. difficile PCR and GI panel negative.  GI recommended Imodium 4 times daily with improvement of her symptomatology. Continue Imodium 2 mg every 6 hours as recommended by GI. Continue gentle IV fluid hydration. Advance diet to soft as tolerated.  Nonoliguric AKI likely prerenal in the setting of dehydration from diarrhea and poor oral intake from intractable nausea. AKI is improving, creatinine is downtrending 2.55 from 3.06 from 5.47. Baseline creatinine appears to be 0.9 with GFR greater than 60 Presented with creatinine greater than 5 with BUN of 92. Continue IV fluid Continue to monitor urine output Continue to avoid nephrotoxic agents Daily renal panel.  Resolved refractory hypokalemia likely from GI losses with diarrhea. Presented with serum potassium of 2.3 Serum potassium 3.8, magnesium 2.7. Continue daily replacement with ongoing diarrhea  Resolved post repletion: Hypomagnesemia likely  from GI losses with diarrhea. Presented with serum magnesium 1.1 Serum magnesium 2.7.  Leukocytosis, no evidence of infective process. C. difficile PCR and GI panel negative. Follow GI panel  C.  difficile PCR was negative WBC is downtrending Repeat CBC in the morning.  Hyperphosphatemia likely in the setting of renal failure Phosphorus 5.2 Repeat level in the morning.  Crohn's disease Continue home regimen. She was seen by GI. She is on mesalamine and prednisone  Hyperlipidemia Continue home Crestor.  Restless leg syndrome Continue home Requip.  Chronic anxiety Continue home regimen  GERD Continue home PPI twice daily.  Aortic atherosclerosis seen on CT scan. Continue home Crestor  Endometrial thickening seen on CT scan Will need to follow-up with gynecology outpatient.     Code Status: Full code.  Family Communication: None at bedside.  Disposition Plan: Likely discharge to home on 05/26/2020 or when diarrhea has resolved.   Consultants: GI.  Procedures:  EGD/Colonoscopy on 05/24/2020.  Antimicrobials:  None.  DVT prophylaxis: SQ heparin 3 times daily.  Status is: Inpatient    Dispo:  Patient From: Home  Planned Disposition: Home  Anticipated discharge date: 05/26/2020 or when diarrhea has improved.  Medically stable for discharge: No, ongoing management of diarrhea and intractable nausea.          Objective: Vitals:   05/24/20 1943 05/25/20 0422 05/25/20 0753 05/25/20 0812  BP: 129/72 136/78  138/67  Pulse: 72 73  76  Resp: 16 18  19   Temp: 98.7 F (37.1 C) 98.4 F (36.9 C)  98.9 F (37.2 C)  TempSrc: Oral Oral  Oral  SpO2: 100% 97% 99% 100%  Weight:  66.7 kg    Height:        Intake/Output Summary (Last 24 hours) at 05/25/2020 1501 Last data filed at 05/25/2020 1300 Gross per 24 hour  Intake 3039.55 ml  Output 1600 ml  Net 1439.55 ml   Filed Weights   05/24/20 0001 05/24/20 1056 05/25/20 0422  Weight: 66.3 kg 66.3 kg 66.7 kg    Exam:  . General: 60 y.o. year-old female well-developed well-nourished in no acute distress.  She is alert oriented x3.  Appears uncomfortable due to persistent nausea. . Cardiovascular:  Regular rate and rhythm no rubs or gallops. Marland Kitchen Respiratory: Clear to auscultation no wheezes rales.   . Abdomen: Soft nontender bowel sounds present.   . Musculoskeletal: No lower extremity edema bilaterally. . Skin: No ulcerative lesions noted. Marland Kitchen Psychiatry: Mood is appropriate for condition and setting.  Data Reviewed: CBC: Recent Labs  Lab 05/22/20 0800 05/23/20 0247 05/24/20 0802  WBC 21.5* 14.9* 12.4*  NEUTROABS 15.4*  --   --   HGB 12.9 10.0* 10.1*  HCT 37.4 27.9* 29.3*  MCV 93.7 90.6 93.0  PLT 460* 269 161   Basic Metabolic Panel: Recent Labs  Lab 05/22/20 0800 05/23/20 0247 05/23/20 0514 05/23/20 2258 05/24/20 0802 05/25/20 0357  NA 135 136  --   --  139 140  K 3.3* 2.3* 2.3* 2.9* 3.1* 3.8  CL 99 101  --   --  108 112*  CO2 16* 19*  --   --  17* 16*  GLUCOSE 163* 103*  --   --  105* 86  BUN 92* 67*  --   --  43* 31*  CREATININE 5.47* 4.16*  --   --  3.06* 2.55*  CALCIUM 10.2 8.5*  --   --  8.7* 8.7*  MG  --  1.1*  --  3.3* 2.7*  --   PHOS  --  5.2*  --   --   --  2.6   GFR: Estimated Creatinine Clearance: 20.5 mL/min (A) (by C-G formula based on SCr of 2.55 mg/dL (H)). Liver Function Tests: Recent Labs  Lab 05/22/20 0800 05/25/20 0357  AST 13*  --   ALT 7  --   ALKPHOS 71  --   BILITOT 0.5  --   PROT 9.0*  --   ALBUMIN 4.4 2.8*   Recent Labs  Lab 05/22/20 0800  LIPASE 116*   No results for input(s): AMMONIA in the last 168 hours. Coagulation Profile: No results for input(s): INR, PROTIME in the last 168 hours. Cardiac Enzymes: No results for input(s): CKTOTAL, CKMB, CKMBINDEX, TROPONINI in the last 168 hours. BNP (last 3 results) No results for input(s): PROBNP in the last 8760 hours. HbA1C: No results for input(s): HGBA1C in the last 72 hours. CBG: No results for input(s): GLUCAP in the last 168 hours. Lipid Profile: No results for input(s): CHOL, HDL, LDLCALC, TRIG, CHOLHDL, LDLDIRECT in the last 72 hours. Thyroid Function Tests: No  results for input(s): TSH, T4TOTAL, FREET4, T3FREE, THYROIDAB in the last 72 hours. Anemia Panel: No results for input(s): VITAMINB12, FOLATE, FERRITIN, TIBC, IRON, RETICCTPCT in the last 72 hours. Urine analysis:    Component Value Date/Time   COLORURINE AMBER (A) 02/17/2019 0305   APPEARANCEUR CLOUDY (A) 02/17/2019 0305   LABSPEC >1.030 (H) 02/17/2019 0305   PHURINE 5.5 02/17/2019 0305   GLUCOSEU NEGATIVE 02/17/2019 0305   HGBUR MODERATE (A) 02/17/2019 0305   BILIRUBINUR SMALL (A) 02/17/2019 0305   KETONESUR NEGATIVE 02/17/2019 0305   PROTEINUR 100 (A) 02/17/2019 0305   NITRITE NEGATIVE 02/17/2019 0305   LEUKOCYTESUR NEGATIVE 02/17/2019 0305   Sepsis Labs: @LABRCNTIP (procalcitonin:4,lacticidven:4)  ) Recent Results (from the past 240 hour(s))  SARS Coronavirus 2 by RT PCR (hospital order, performed in [place] hospital lab)     Status: None   Collection Time: 05/22/20  9:50 AM  Result Value Ref Range Status   SARS Coronavirus 2 NEGATIVE NEGATIVE Final    Comment: (NOTE) SARS-CoV-2 target nucleic acids are NOT DETECTED.  The SARS-CoV-2 RNA is generally detectable in upper and lower respiratory specimens during the acute phase of infection. The lowest concentration of SARS-CoV-2 viral copies this assay can detect is 250 copies / mL. A negative result does not preclude SARS-CoV-2 infection and should not be used as the sole basis for treatment or other patient management decisions.  A negative result may occur with improper specimen collection / handling, submission of specimen other than nasopharyngeal swab, presence of viral mutation(s) within the areas targeted by this assay, and inadequate number of viral copies (<250 copies / mL). A negative result must be combined with clinical observations, patient history, and epidemiological information.  Fact Sheet for Patients:   StrictlyIdeas.no  Fact Sheet for Healthcare  Providers: BankingDealers.co.za  This test is not yet approved or  cleared by the [place] FDA and has been authorized for detection and/or diagnosis of SARS-CoV-2 by FDA under an Emergency Use Authorization (EUA).  This EUA will remain in effect (meaning this test can be used) for the duration of the COVID-19 declaration under Section 564(b)(1) of the Act, 21 U.S.C. section 360bbb-3(b)(1), unless the authorization is terminated or revoked sooner.  Performed at [facility], [address] [place], [name] [address]   Gastrointestinal Panel by PCR , Stool  Status: None   Collection Time: 05/23/20  4:54 PM   Specimen: Stool  Result Value Ref Range Status   Campylobacter species NOT DETECTED NOT DETECTED Final   Plesimonas shigelloides NOT DETECTED NOT DETECTED Final   Salmonella species NOT DETECTED NOT DETECTED Final   Yersinia enterocolitica NOT DETECTED NOT DETECTED Final   Vibrio species NOT DETECTED NOT DETECTED Final   Vibrio cholerae NOT DETECTED NOT DETECTED Final   Enteroaggregative E coli (EAEC) NOT DETECTED NOT DETECTED Final   Enteropathogenic E coli (EPEC) NOT DETECTED NOT DETECTED Final   Enterotoxigenic E coli (ETEC) NOT DETECTED NOT DETECTED Final   Shiga like toxin producing E coli (STEC) NOT DETECTED NOT DETECTED Final   Shigella/Enteroinvasive E coli (EIEC) NOT DETECTED NOT DETECTED Final   Cryptosporidium NOT DETECTED NOT DETECTED Final   Cyclospora cayetanensis NOT DETECTED NOT DETECTED Final   Entamoeba histolytica NOT DETECTED NOT DETECTED Final   Giardia lamblia NOT DETECTED NOT DETECTED Final   Adenovirus F40/41 NOT DETECTED NOT DETECTED Final   Astrovirus NOT DETECTED NOT DETECTED Final   Norovirus GI/GII NOT DETECTED NOT DETECTED Final   Rotavirus A NOT DETECTED NOT DETECTED Final   Sapovirus (I, II, IV, and V) NOT DETECTED NOT DETECTED Final    Comment: Performed at [facility], [address]  [place], [name] [address]  C Difficile Quick Screen w PCR reflex     Status: None   Collection Time: 05/23/20  4:54 PM   Specimen: STOOL  Result Value Ref Range Status   C Diff antigen NEGATIVE NEGATIVE Final   C Diff toxin NEGATIVE NEGATIVE Final   C Diff interpretation No C. difficile detected.  Final    Comment: Performed at [facility], [place] [address] [place], [place] [address]      Studies: No results found.  Scheduled Meds: . ALPRAZolam  0.5 mg Oral QHS  . feeding supplement  1 Container Oral TID BM  . Gerhardt's butt cream   Topical TID  . heparin  5,000 Units Subcutaneous Q8H  . loperamide  4 mg Oral QID  . Mesalamine  400 mg Oral BID  . mometasone-formoterol  2 puff Inhalation BID  . multivitamin with minerals  1 tablet Oral Daily  . mycophenolate  1,000 mg Oral BID  . pantoprazole  40 mg Oral BID  . potassium chloride  40 mEq Oral BID  . predniSONE  10 mg Oral q morning  . progesterone  100 mg Oral QHS  . pyridostigmine  30 mg Oral Daily  . rOPINIRole  1 mg Oral QHS,MR X 1  . rosuvastatin  10 mg Oral Daily  . sodium bicarbonate  1,300 mg Oral BID  . sodium chloride flush  3 mL Intravenous Q12H    Continuous Infusions: . sodium chloride Stopped (05/24/20 1211)  . chlorproMAZINE (THORAZINE) IV Stopped (05/25/20 0557)     LOS: 3 days     Kayleen Memos, MD [facility] Pager [phone]  If 7PM-7AM, please contact night-coverage [email or web address] Password [facility] 05/25/2020, 3:01 PM

## 2020-05-25 NOTE — Progress Notes (Signed)
Patient states her nausea is a little better and she is eating small amounts of food.  Liquid stool, mixed with urine, typically about twice on each shift, based on recorded notes.  The patient does think the Imodium offers transient improvement in the diarrhea.  Biopsy results from yesterday's procedures are still pending  Plan: Continue current management, await path results.  Cleotis Nipper, M.D. Pager [phone] If no answer or after 5 PM call [phone]

## 2020-05-25 NOTE — Progress Notes (Signed)
Pt performed NIF -40 with good effort.

## 2020-05-26 ENCOUNTER — Encounter (HOSPITAL_COMMUNITY): Payer: Self-pay | Admitting: Gastroenterology

## 2020-05-26 DIAGNOSIS — G894 Chronic pain syndrome: Secondary | ICD-10-CM

## 2020-05-26 DIAGNOSIS — N179 Acute kidney failure, unspecified: Principal | ICD-10-CM

## 2020-05-26 LAB — RENAL FUNCTION PANEL
Albumin: 2.8 g/dL — ABNORMAL LOW (ref 3.5–5.0)
Anion gap: 11 (ref 5–15)
BUN: 25 mg/dL — ABNORMAL HIGH (ref 6–20)
CO2: 17 mmol/L — ABNORMAL LOW (ref 22–32)
Calcium: 8.6 mg/dL — ABNORMAL LOW (ref 8.9–10.3)
Chloride: 114 mmol/L — ABNORMAL HIGH (ref 98–111)
Creatinine, Ser: 2.16 mg/dL — ABNORMAL HIGH (ref 0.44–1.00)
GFR, Estimated: 26 mL/min — ABNORMAL LOW (ref >60)
Glucose, Bld: 101 mg/dL — ABNORMAL HIGH (ref 70–99)
Phosphorus: 2.6 mg/dL (ref 2.5–4.6)
Potassium: 3.9 mmol/L (ref 3.5–5.1)
Sodium: 142 mmol/L (ref 135–145)

## 2020-05-26 MED ORDER — PROMETHAZINE HCL 25 MG PO TABS
25.0000 mg | ORAL_TABLET | Freq: Four times a day (QID) | ORAL | Status: DC | PRN
  Administered 2020-05-26 – 2020-05-28 (×4): 25 mg via ORAL
  Filled 2020-05-26 (×4): qty 1

## 2020-05-26 MED ORDER — PROMETHAZINE HCL 25 MG/ML IJ SOLN: 25.0000 mg | Freq: Four times a day (QID) | INTRAMUSCULAR | Status: DC | PRN

## 2020-05-26 NOTE — Progress Notes (Addendum)
Patient is doing better.  She continues to be able to eat, despite some residual nausea.  No vomiting.  Diarrhea it is harder to assess.  She states she is still having diarrhea, "5-10" bowel movements per day.  However, from talking with the nurse and reviewing the record, it appears she has only had 2 bowel movements per day, roughly 3 in the past 24 hours.  One this morning was indeed diarrheal in character.  Metabolic panel shows improving acute kidney injury.  No hypokalemia.  Biopsies from her recent endoscopy and colonoscopy are unrevealing.  She had some mild gastritis, not felt to be clinically significant.  Duodenal biopsies were normal.  No cause for nausea endoscopically evident.  From the lower tract perspective, she had a small colonic adenoma which will need surveillance colonoscopy in 7 years, and random biopsies of the colon were negative for any active colitis.  Not only is there not evidence of active Crohn's, but there is not evidence of microscopic colitis, either.  Impression:   Improved symptoms of nausea and diarrhea.  Etiology for both remains unclear.  Mild residual nausea and diarrhea, but not of sufficient severity to necessitate ongoing hospitalization.  Recommendation:  1.  I will sign off.  Please call if you have further questions pertaining to this patient's care.  2.  I have left a message with the patient's primary gastroenterologist, Dr. Cannon Kettle, to call her and arrange follow-up in the next several weeks  3.  No dietary restrictions at time of discharge.  Would continue high-dose Imodium, 8 to 12 capsules/day, probably 2 or 3 capsules every 6-8 hours.  May also continue mesalamine for possible low-grade Crohn's colitis, although at some point that may be able to be stopped as an outpatient.  For the upper tract, for now I would continue high-dose pantoprazole, 40 mg twice daily  4.  I think discharge at this time is reasonable, with respect to the GI  tract.  However, with resolving acute kidney injury you may want to keep her longer for other reasons.  Cleotis Nipper, M.D. Pager [phone] If no answer or after 5 PM call [phone]

## 2020-05-26 NOTE — Progress Notes (Signed)
Nif -40 without any complications

## 2020-05-26 NOTE — Progress Notes (Addendum)
Pt noted to have 1 occurrence of type 7 loose stool this am, w/o N/V.

## 2020-05-26 NOTE — Progress Notes (Signed)
NIF -35

## 2020-05-26 NOTE — Progress Notes (Signed)
PROGRESS NOTE    Teresa Lee  XHB:[id number] DOB: [birth year] DOA: 05/22/2020 PCP: [name], MD    Brief Narrative:  60 y.o.femalewith medical history significant ofHTN, HLD, myasthenia gravis, fibromyalgia, asthma, restless leg syndrome, and Crohn's disease who presents with complaints of nausea and diarrhea over the last 2 weeks. Most of history is obtained from the patient's husband who is present at bedside at her request. Patient had L4-5 TLIF and right micro discectomy of L5-S1 on 2/15with Dr. [name] of neurosurgery. Patient stayed hospitalized for 3 days following surgery and was discharged home. She has been taking hydrocodone-acetaminophen 10 mg-325 mg up to 5 per daily for back pain. Over the last 2 weeks patient has complained of persistent nausea and has had no appetite. She reports everything taste and smells horrible. Patient has been taking Zofran almost around-the-clock. Denies any vomiting, but she has had dry heaves. Initially following the surgery she had been constipated, but after taking every laxatives and enemas she was finally able to have a bowel movement 7 days ago. However,since that time patient has had persistent diarrhea. Associated symptoms include possibly 12 pound weight loss, decreased urine output, intermittent hot/cold spells, constant hiccups, issues with her memory, intermittentlydisoriented at times, and using a walker to get around whenpreviously she couldambulate without assistance.  ED Course:On admission to the emergency department patient was seen to be afebrile relatively stable vital signs. Labs significant for WBC 21.5K, platelets 460, potassium 3.3, CO2 16, BUN 92, creatinine 5.47, anion gap 20, and lipase 116. COVID-19 screening was negative. CT scan of the abdomen and pelvis only noted of the heterogeneityof the central uterus which may represent endometrial thickening/mass.  Assessment & Plan:   Principal  Problem:   ARF (acute renal failure) (HCC) Active Problems:   Essential hypertension   Asthma   Leukocytosis   Hypokalemia   Myasthenia gravis (HCC)   Crohn disease (HCC)   GERD (gastroesophageal reflux disease)   Anxiety disorder   Chronic pain disorder   Diarrhea   Nausea   Acute intractable nausea and diarrhea, unclear etiology No CT evidence of Crohn's flare She states her symptoms do not feel like her typical flare from Crohn's disease.  IV antiemetics continued as needed. EGD and colonoscopy from 05/24/20 was nonrevealing.  C. difficile PCR and GI panel negative.  GI recommended continued Imodium as needed Continue Imodium 2 mg every 6 hours as recommended by GI. Continue gentle IV fluid hydration. Cont to advance diet as tolerated Per GI, improved symptoms of nausea and diarrhea with recommendation for close outpt f/u  Nonoliguric AKI likely prerenal in the setting of dehydration from diarrhea and poor oral intake from intractable nausea. AKI is improving, creatinine is downtrending 2.16 from 3.06 from 5.47. Baseline creatinine appears to be 0.9 with GFR greater than 60 Presented with creatinine greater than 5 with BUN of 92. Continued on IV fluid Continue to avoid nephrotoxic agents Daily renal panel.  Resolved refractory hypokalemia likely from GI losses with diarrhea. Presented with serum potassium of 2.3 Continue daily replacement with ongoing diarrhea K today of 3.9  Resolved post repletion: Hypomagnesemia likely from GI losses with diarrhea. Presented with serum magnesium 1.1 Serum magnesium 2.7.  Leukocytosis, no evidence of infective process. C. difficile PCR and GI panel negative. GI panel is neg C. difficile PCR was negative WBC is downtrending Would cont to follow CBC trends  Hyperphosphatemia likely in the setting of renal failure Phosphorus 2.6 Cont to follow levels  Crohn's  disease Continue home regimen. She was seen by GI. She is on  mesalamine and prednisone  Hyperlipidemia Continue home Crestor.  Restless leg syndrome Continue home Requip.  Chronic anxiety Continue home regimen  GERD Continue home PPI twice daily.  Aortic atherosclerosis seen on CT scan. Continue home Crestor  Endometrial thickening seen on CT scan Will need to follow-up with gynecology outpatient.   DVT prophylaxis: Heparin subq Code Status: Full Family Communication: Pt in room, family not at bedside  Status is: Inpatient  Remains inpatient appropriate because:IV treatments appropriate due to intensity of illness or inability to take PO and Inpatient level of care appropriate due to severity of illness   Dispo:  Patient From: Home  Planned Disposition: Home  Medically stable for discharge: No         Consultants:   GI  Procedures:   EGD, colonoscopy  Antimicrobials: Anti-infectives (From admission, onward)   None       Subjective: Still reporting diarrhea and nausea  Objective: Vitals:   05/25/20 1931 05/26/20 0441 05/26/20 0739 05/26/20 0802  BP:  138/76  (!) 143/76  Pulse:  71  97  Resp:  17  20  Temp:  99.3 F (37.4 C)  98.4 F (36.9 C)  TempSrc:  Oral  Oral  SpO2: 98% 98% 98% 97%  Weight:  68.5 kg    Height:        Intake/Output Summary (Last 24 hours) at 05/26/2020 1251 Last data filed at 05/26/2020 1055 Gross per 24 hour  Intake 1752.85 ml  Output 1850 ml  Net -97.15 ml   Filed Weights   05/24/20 1056 05/25/20 0422 05/26/20 0441  Weight: 66.3 kg 66.7 kg 68.5 kg    Examination:  General exam: Appears calm and comfortable  Respiratory system: Clear to auscultation. Respiratory effort normal. Cardiovascular system: S1 & S2 heard, Regular Gastrointestinal system: Abdomen is nondistended, soft and nontender. No organomegaly or masses felt. Normal bowel sounds heard. Central nervous system: Alert and oriented. No focal neurological deficits. Extremities: Symmetric 5 x 5  power. Skin: No rashes, lesions Psychiatry: Judgement and insight appear normal. Mood & affect appropriate.   Data Reviewed: I have personally reviewed following labs and imaging studies  CBC: Recent Labs  Lab 05/22/20 0800 05/23/20 0247 05/24/20 0802  WBC 21.5* 14.9* 12.4*  NEUTROABS 15.4*  --   --   HGB 12.9 10.0* 10.1*  HCT 37.4 27.9* 29.3*  MCV 93.7 90.6 93.0  PLT 460* 269 774   Basic Metabolic Panel: Recent Labs  Lab 05/22/20 0800 05/23/20 0247 05/23/20 0514 05/23/20 2258 05/24/20 0802 05/25/20 0357 05/26/20 0442  NA 135 136  --   --  139 140 142  K 3.3* 2.3* 2.3* 2.9* 3.1* 3.8 3.9  CL 99 101  --   --  108 112* 114*  CO2 16* 19*  --   --  17* 16* 17*  GLUCOSE 163* 103*  --   --  105* 86 101*  BUN 92* 67*  --   --  43* 31* 25*  CREATININE 5.47* 4.16*  --   --  3.06* 2.55* 2.16*  CALCIUM 10.2 8.5*  --   --  8.7* 8.7* 8.6*  MG  --  1.1*  --  3.3* 2.7*  --   --   PHOS  --  5.2*  --   --   --  2.6 2.6   GFR: Estimated Creatinine Clearance: 24.5 mL/min (A) (by C-G formula based on SCr of  2.16 mg/dL (H)). Liver Function Tests: Recent Labs  Lab 05/22/20 0800 05/25/20 0357 05/26/20 0442  AST 13*  --   --   ALT 7  --   --   ALKPHOS 71  --   --   BILITOT 0.5  --   --   PROT 9.0*  --   --   ALBUMIN 4.4 2.8* 2.8*   Recent Labs  Lab 05/22/20 0800  LIPASE 116*   No results for input(s): AMMONIA in the last 168 hours. Coagulation Profile: No results for input(s): INR, PROTIME in the last 168 hours. Cardiac Enzymes: No results for input(s): CKTOTAL, CKMB, CKMBINDEX, TROPONINI in the last 168 hours. BNP (last 3 results) No results for input(s): PROBNP in the last 8760 hours. HbA1C: No results for input(s): HGBA1C in the last 72 hours. CBG: No results for input(s): GLUCAP in the last 168 hours. Lipid Profile: No results for input(s): CHOL, HDL, LDLCALC, TRIG, CHOLHDL, LDLDIRECT in the last 72 hours. Thyroid Function Tests: No results for input(s): TSH,  T4TOTAL, FREET4, T3FREE, THYROIDAB in the last 72 hours. Anemia Panel: No results for input(s): VITAMINB12, FOLATE, FERRITIN, TIBC, IRON, RETICCTPCT in the last 72 hours. Sepsis Labs: Recent Labs  Lab 05/22/20 1524 05/22/20 1749  LATICACIDVEN 1.5 1.3    Recent Results (from the past 240 hour(s))  SARS Coronavirus 2 by RT PCR (hospital order, performed in [place] hospital lab)     Status: None   Collection Time: 05/22/20  9:50 AM  Result Value Ref Range Status   SARS Coronavirus 2 NEGATIVE NEGATIVE Final    Comment: (NOTE) SARS-CoV-2 target nucleic acids are NOT DETECTED.  The SARS-CoV-2 RNA is generally detectable in upper and lower respiratory specimens during the acute phase of infection. The lowest concentration of SARS-CoV-2 viral copies this assay can detect is 250 copies / mL. A negative result does not preclude SARS-CoV-2 infection and should not be used as the sole basis for treatment or other patient management decisions.  A negative result may occur with improper specimen collection / handling, submission of specimen other than nasopharyngeal swab, presence of viral mutation(s) within the areas targeted by this assay, and inadequate number of viral copies (<250 copies / mL). A negative result must be combined with clinical observations, patient history, and epidemiological information.  Fact Sheet for Patients:   StrictlyIdeas.no  Fact Sheet for Healthcare Providers: BankingDealers.co.za  This test is not yet approved or  cleared by the [place] FDA and has been authorized for detection and/or diagnosis of SARS-CoV-2 by FDA under an Emergency Use Authorization (EUA).  This EUA will remain in effect (meaning this test can be used) for the duration of the COVID-19 declaration under Section 564(b)(1) of the Act, 21 U.S.C. section 360bbb-3(b)(1), unless the authorization is terminated or revoked  sooner.  Performed at [facility], [address] [place], [name] [address]   Gastrointestinal Panel by PCR , Stool     Status: None   Collection Time: 05/23/20  4:54 PM   Specimen: Stool  Result Value Ref Range Status   Campylobacter species NOT DETECTED NOT DETECTED Final   Plesimonas shigelloides NOT DETECTED NOT DETECTED Final   Salmonella species NOT DETECTED NOT DETECTED Final   Yersinia enterocolitica NOT DETECTED NOT DETECTED Final   Vibrio species NOT DETECTED NOT DETECTED Final   Vibrio cholerae NOT DETECTED NOT DETECTED Final   Enteroaggregative E coli (EAEC) NOT DETECTED NOT DETECTED Final   Enteropathogenic E  coli (EPEC) NOT DETECTED NOT DETECTED Final   Enterotoxigenic E coli (ETEC) NOT DETECTED NOT DETECTED Final   Shiga like toxin producing E coli (STEC) NOT DETECTED NOT DETECTED Final   Shigella/Enteroinvasive E coli (EIEC) NOT DETECTED NOT DETECTED Final   Cryptosporidium NOT DETECTED NOT DETECTED Final   Cyclospora cayetanensis NOT DETECTED NOT DETECTED Final   Entamoeba histolytica NOT DETECTED NOT DETECTED Final   Giardia lamblia NOT DETECTED NOT DETECTED Final   Adenovirus F40/41 NOT DETECTED NOT DETECTED Final   Astrovirus NOT DETECTED NOT DETECTED Final   Norovirus GI/GII NOT DETECTED NOT DETECTED Final   Rotavirus A NOT DETECTED NOT DETECTED Final   Sapovirus (I, II, IV, and V) NOT DETECTED NOT DETECTED Final    Comment: Performed at [facility], [address] [place], [place] [address]  C Difficile Quick Screen w PCR reflex     Status: None   Collection Time: 05/23/20  4:54 PM   Specimen: STOOL  Result Value Ref Range Status   C Diff antigen NEGATIVE NEGATIVE Final   C Diff toxin NEGATIVE NEGATIVE Final   C Diff interpretation No C. difficile detected.  Final    Comment: Performed at [facility], [place] [address] [place], [place] [address]     Radiology Studies: No results found.  Scheduled Meds: . ALPRAZolam  0.5  mg Oral QHS  . feeding supplement  1 Container Oral TID BM  . Gerhardt's butt cream   Topical TID  . heparin  5,000 Units Subcutaneous Q8H  . loperamide  4 mg Oral QID  . Mesalamine  400 mg Oral BID  . mometasone-formoterol  2 puff Inhalation BID  . multivitamin with minerals  1 tablet Oral Daily  . mycophenolate  1,000 mg Oral BID  . pantoprazole  40 mg Oral BID  . potassium chloride  40 mEq Oral BID  . predniSONE  10 mg Oral q morning  . progesterone  100 mg Oral QHS  . pyridostigmine  30 mg Oral Daily  . rOPINIRole  1 mg Oral QHS,MR X 1  . rosuvastatin  10 mg Oral Daily  . sodium bicarbonate  1,300 mg Oral BID  . sodium chloride flush  3 mL Intravenous Q12H   Continuous Infusions: . sodium chloride 50 mL/hr at 05/25/20 1700  . chlorproMAZINE (THORAZINE) IV Stopped (05/25/20 0557)     LOS: 4 days   Marylu Lund, MD [facility] Pager On Amion  If 7PM-7AM, please contact night-coverage 05/26/2020, 12:51 PM

## 2020-05-26 NOTE — Care Management Important Message (Signed)
Important Message  Patient Details  Name: Teresa Lee MRN: [id number] Date of Birth: 09/30/1960   Medicare Important Message Given:  Yes     Brittainy Bucker P Meghna Hagmann 05/26/2020, 2:13 PM

## 2020-05-27 DIAGNOSIS — I10 Essential (primary) hypertension: Secondary | ICD-10-CM

## 2020-05-27 LAB — CBC
HCT: 25.8 % — ABNORMAL LOW (ref 36.0–46.0)
Hemoglobin: 8.4 g/dL — ABNORMAL LOW (ref 12.0–15.0)
MCH: 32.3 pg (ref 26.0–34.0)
MCHC: 32.6 g/dL (ref 30.0–36.0)
MCV: 99.2 fL (ref 80.0–100.0)
Platelets: 169 10*3/uL (ref 150–400)
RBC: 2.6 MIL/uL — ABNORMAL LOW (ref 3.87–5.11)
RDW: 13.7 % (ref 11.5–15.5)
WBC: 12.3 10*3/uL — ABNORMAL HIGH (ref 4.0–10.5)
nRBC: 0 % (ref 0.0–0.2)

## 2020-05-27 LAB — COMPREHENSIVE METABOLIC PANEL
ALT: 12 U/L (ref 0–44)
AST: 21 U/L (ref 15–41)
Albumin: 2.8 g/dL — ABNORMAL LOW (ref 3.5–5.0)
Alkaline Phosphatase: 49 U/L (ref 38–126)
Anion gap: 12 (ref 5–15)
BUN: 25 mg/dL — ABNORMAL HIGH (ref 6–20)
CO2: 18 mmol/L — ABNORMAL LOW (ref 22–32)
Calcium: 7.8 mg/dL — ABNORMAL LOW (ref 8.9–10.3)
Chloride: 112 mmol/L — ABNORMAL HIGH (ref 98–111)
Creatinine, Ser: 2.14 mg/dL — ABNORMAL HIGH (ref 0.44–1.00)
GFR, Estimated: 26 mL/min — ABNORMAL LOW (ref >60)
Glucose, Bld: 113 mg/dL — ABNORMAL HIGH (ref 70–99)
Potassium: 4.3 mmol/L (ref 3.5–5.1)
Sodium: 142 mmol/L (ref 135–145)
Total Bilirubin: 0.7 mg/dL (ref 0.3–1.2)
Total Protein: 5.6 g/dL — ABNORMAL LOW (ref 6.5–8.1)

## 2020-05-27 LAB — PHOSPHORUS: Phosphorus: 2.5 mg/dL (ref 2.5–4.6)

## 2020-05-27 MED ORDER — PSYLLIUM 95 % PO PACK
1.0000 | PACK | Freq: Two times a day (BID) | ORAL | Status: DC
  Administered 2020-05-27: 1 via ORAL
  Filled 2020-05-27: qty 1

## 2020-05-27 MED ORDER — SODIUM CHLORIDE 0.9 % IV SOLN: INTRAVENOUS | Status: DC

## 2020-05-27 NOTE — Progress Notes (Addendum)
PROGRESS NOTE    Teresa Lee  EGB:[id number] DOB: 1960/03/21 DOA: 05/22/2020 PCP: [name], MD    Brief Narrative:  60 y.o.femalewith medical history significant ofHTN, HLD, myasthenia gravis, fibromyalgia, asthma, restless leg syndrome, and Crohn's disease who presents with complaints of nausea and diarrhea over the last 2 weeks. Most of history is obtained from the patient's husband who is present at bedside at her request. Patient had L4-5 TLIF and right micro discectomy of L5-S1 on 2/15with Dr. [name] of neurosurgery. Patient stayed hospitalized for 3 days following surgery and was discharged home. She has been taking hydrocodone-acetaminophen 10 mg-325 mg up to 5 per daily for back pain. Over the last 2 weeks patient has complained of persistent nausea and has had no appetite. She reports everything taste and smells horrible. Patient has been taking Zofran almost around-the-clock. Denies any vomiting, but she has had dry heaves. Initially following the surgery she had been constipated, but after taking every laxatives and enemas she was finally able to have a bowel movement 7 days ago. However,since that time patient has had persistent diarrhea. Associated symptoms include possibly 12 pound weight loss, decreased urine output, intermittent hot/cold spells, constant hiccups, issues with her memory, intermittentlydisoriented at times, and using a walker to get around whenpreviously she couldambulate without assistance.  ED Course:On admission to the emergency department patient was seen to be afebrile relatively stable vital signs. Labs significant for WBC 21.5K, platelets 460, potassium 3.3, CO2 16, BUN 92, creatinine 5.47, anion gap 20, and lipase 116. COVID-19 screening was negative. CT scan of the abdomen and pelvis only noted of the heterogeneityof the central uterus which may represent endometrial thickening/mass.  Assessment & Plan:   Principal  Problem:   ARF (acute renal failure) (HCC) Active Problems:   Essential hypertension   Asthma   Leukocytosis   Hypokalemia   Myasthenia gravis (HCC)   Crohn disease (HCC)   GERD (gastroesophageal reflux disease)   Anxiety disorder   Chronic pain disorder   Diarrhea   Nausea   Acute intractable nausea and diarrhea, unclear etiology No CT evidence of Crohn's flare She states her symptoms do not feel like her typical flare from Crohn's disease.  IV antiemetics continued as needed. EGD and colonoscopy from 05/24/20 was nonrevealing.  C. difficile PCR and GI panel negative.  GI recommended continued Imodium as needed Continue Imodium 2 mg every 6 hours as recommended by GI. Will continue IVF, increased this AM to 100cc/hr. See below Advanced to soft diet Per GI, improved symptoms of nausea and diarrhea with recommendation for close outpt f/u  Nonoliguric AKI likely prerenal in the setting of dehydration from diarrhea and poor oral intake from intractable nausea. AKI is improving, creatinine is downtrending 2.14 from 3.06 from 5.47. Baseline creatinine appears to be 0.9 with GFR greater than 60 Presented with creatinine greater than 5 with BUN of 92. Continued on IV fluid Continue to avoid nephrotoxic agents Recheck bmet in AM  Resolved refractory hypokalemia likely from GI losses with diarrhea. Presented with serum potassium of 2.3 Continue daily replacement with ongoing diarrhea K today normal at 4.3  Resolved post repletion: Hypomagnesemia likely from GI losses with diarrhea. Presented with serum magnesium 1.1 Serum magnesium 2.7.  Leukocytosis, no evidence of infective process. C. difficile PCR and GI panel negative. GI panel is neg C. difficile PCR was negative WBC had been downtrending Follow CBC trends  Hyperphosphatemia likely in the setting of renal failure Phosphorus 2.6 Cont to follow  levels  Crohn's disease Continue home regimen. She was seen by GI,  now signed off. Pt to follow up closely with GI as outpatient She is on mesalamine and prednisone  Hyperlipidemia Continue home Crestor.  Restless leg syndrome Continue home Requip.  Chronic anxiety Continue home regimen  GERD Continue home PPI twice daily.  Aortic atherosclerosis seen on CT scan. Continue home Crestor as tolerated  Endometrial thickening seen on CT scan Will need to follow-up with gynecology outpatient.  Dehydration -Dry mucus membranes on exam with poor skin turgor -Pt reports feeling "weaker" than her baseline -Suspect dehydration secondary to volume loss in the setting of above diarrhea with poor PO intake -IVF were continued at 75cc/hr this AM. Given dehydration, have increased to 100cc/hr -repeat bmet in AM   DVT prophylaxis: Heparin subq Code Status: Full Family Communication: Pt in room, family not at bedside  Status is: Inpatient  Remains inpatient appropriate because:IV treatments appropriate due to intensity of illness or inability to take PO and Inpatient level of care appropriate due to severity of illness  Dispo:  Patient From: Home  Planned Disposition: Home  Medically stable for discharge: No     Consultants:   GI  Procedures:   EGD, colonoscopy  Antimicrobials: Anti-infectives (From admission, onward)   None      Subjective: Still reporting diarrhea and nausea  Objective: Vitals:   05/26/20 2047 05/27/20 0402 05/27/20 0735 05/27/20 1135  BP: 117/66 136/74  (!) 144/83  Pulse: 88 78 82 95  Resp: 18 18 15 20   Temp: 99.3 F (37.4 C) 98.7 F (37.1 C)  98.2 F (36.8 C)  TempSrc: Oral Oral  Oral  SpO2: 98% 98% 97% 100%  Weight:  66.9 kg    Height:        Intake/Output Summary (Last 24 hours) at 05/27/2020 1415 Last data filed at 05/27/2020 1307 Gross per 24 hour  Intake 1134 ml  Output 1400 ml  Net -266 ml   Filed Weights   05/25/20 0422 05/26/20 0441 05/27/20 0402  Weight: 66.7 kg 68.5 kg 66.9 kg     Examination: General exam: Awake, laying in bed, in nad, dry mucus membranes Respiratory system: Normal respiratory effort, no wheezing Cardiovascular system: regular rate, s1, s2 Gastrointestinal system: Soft, nondistended, positive BS Central nervous system: CN2-12 grossly intact, strength intact Extremities: Perfused, no clubbing Skin: decreased skin turgor, no notable skin lesions seen Psychiatry: Mood normal // no visual hallucinations   Data Reviewed: I have personally reviewed following labs and imaging studies  CBC: Recent Labs  Lab 05/22/20 0800 05/23/20 0247 05/24/20 0802 05/27/20 0223  WBC 21.5* 14.9* 12.4* 12.3*  NEUTROABS 15.4*  --   --   --   HGB 12.9 10.0* 10.1* 8.4*  HCT 37.4 27.9* 29.3* 25.8*  MCV 93.7 90.6 93.0 99.2  PLT 460* 269 218 381   Basic Metabolic Panel: Recent Labs  Lab 05/23/20 0247 05/23/20 0514 05/23/20 2258 05/24/20 0802 05/25/20 0357 05/26/20 0442 05/27/20 0223  NA 136  --   --  139 140 142 142  K 2.3*   < > 2.9* 3.1* 3.8 3.9 4.3  CL 101  --   --  108 112* 114* 112*  CO2 19*  --   --  17* 16* 17* 18*  GLUCOSE 103*  --   --  105* 86 101* 113*  BUN 67*  --   --  43* 31* 25* 25*  CREATININE 4.16*  --   --  3.06*  2.55* 2.16* 2.14*  CALCIUM 8.5*  --   --  8.7* 8.7* 8.6* 7.8*  MG 1.1*  --  3.3* 2.7*  --   --   --   PHOS 5.2*  --   --   --  2.6 2.6 2.5   < > = values in this interval not displayed.   GFR: Estimated Creatinine Clearance: 24.5 mL/min (A) (by C-G formula based on SCr of 2.14 mg/dL (H)). Liver Function Tests: Recent Labs  Lab 05/22/20 0800 05/25/20 0357 05/26/20 0442 05/27/20 0223  AST 13*  --   --  21  ALT 7  --   --  12  ALKPHOS 71  --   --  49  BILITOT 0.5  --   --  0.7  PROT 9.0*  --   --  5.6*  ALBUMIN 4.4 2.8* 2.8* 2.8*   Recent Labs  Lab 05/22/20 0800  LIPASE 116*   No results for input(s): AMMONIA in the last 168 hours. Coagulation Profile: No results for input(s): INR, PROTIME in the last 168  hours. Cardiac Enzymes: No results for input(s): CKTOTAL, CKMB, CKMBINDEX, TROPONINI in the last 168 hours. BNP (last 3 results) No results for input(s): PROBNP in the last 8760 hours. HbA1C: No results for input(s): HGBA1C in the last 72 hours. CBG: No results for input(s): GLUCAP in the last 168 hours. Lipid Profile: No results for input(s): CHOL, HDL, LDLCALC, TRIG, CHOLHDL, LDLDIRECT in the last 72 hours. Thyroid Function Tests: No results for input(s): TSH, T4TOTAL, FREET4, T3FREE, THYROIDAB in the last 72 hours. Anemia Panel: No results for input(s): VITAMINB12, FOLATE, FERRITIN, TIBC, IRON, RETICCTPCT in the last 72 hours. Sepsis Labs: Recent Labs  Lab 05/22/20 1524 05/22/20 1749  LATICACIDVEN 1.5 1.3    Recent Results (from the past 240 hour(s))  SARS Coronavirus 2 by RT PCR (hospital order, performed in [place] hospital lab)     Status: None   Collection Time: 05/22/20  9:50 AM  Result Value Ref Range Status   SARS Coronavirus 2 NEGATIVE NEGATIVE Final    Comment: (NOTE) SARS-CoV-2 target nucleic acids are NOT DETECTED.  The SARS-CoV-2 RNA is generally detectable in upper and lower respiratory specimens during the acute phase of infection. The lowest concentration of SARS-CoV-2 viral copies this assay can detect is 250 copies / mL. A negative result does not preclude SARS-CoV-2 infection and should not be used as the sole basis for treatment or other patient management decisions.  A negative result may occur with improper specimen collection / handling, submission of specimen other than nasopharyngeal swab, presence of viral mutation(s) within the areas targeted by this assay, and inadequate number of viral copies (<250 copies / mL). A negative result must be combined with clinical observations, patient history, and epidemiological information.  Fact Sheet for Patients:   StrictlyIdeas.no  Fact Sheet for Healthcare  Providers: BankingDealers.co.za  This test is not yet approved or  cleared by the [place] FDA and has been authorized for detection and/or diagnosis of SARS-CoV-2 by FDA under an Emergency Use Authorization (EUA).  This EUA will remain in effect (meaning this test can be used) for the duration of the COVID-19 declaration under Section 564(b)(1) of the Act, 21 U.S.C. section 360bbb-3(b)(1), unless the authorization is terminated or revoked sooner.  Performed at [facility], [address] [place], [name] [address]   Gastrointestinal Panel by PCR , Stool     Status: None   Collection Time:  05/23/20  4:54 PM   Specimen: Stool  Result Value Ref Range Status   Campylobacter species NOT DETECTED NOT DETECTED Final   Plesimonas shigelloides NOT DETECTED NOT DETECTED Final   Salmonella species NOT DETECTED NOT DETECTED Final   Yersinia enterocolitica NOT DETECTED NOT DETECTED Final   Vibrio species NOT DETECTED NOT DETECTED Final   Vibrio cholerae NOT DETECTED NOT DETECTED Final   Enteroaggregative E coli (EAEC) NOT DETECTED NOT DETECTED Final   Enteropathogenic E coli (EPEC) NOT DETECTED NOT DETECTED Final   Enterotoxigenic E coli (ETEC) NOT DETECTED NOT DETECTED Final   Shiga like toxin producing E coli (STEC) NOT DETECTED NOT DETECTED Final   Shigella/Enteroinvasive E coli (EIEC) NOT DETECTED NOT DETECTED Final   Cryptosporidium NOT DETECTED NOT DETECTED Final   Cyclospora cayetanensis NOT DETECTED NOT DETECTED Final   Entamoeba histolytica NOT DETECTED NOT DETECTED Final   Giardia lamblia NOT DETECTED NOT DETECTED Final   Adenovirus F40/41 NOT DETECTED NOT DETECTED Final   Astrovirus NOT DETECTED NOT DETECTED Final   Norovirus GI/GII NOT DETECTED NOT DETECTED Final   Rotavirus A NOT DETECTED NOT DETECTED Final   Sapovirus (I, II, IV, and V) NOT DETECTED NOT DETECTED Final    Comment: Performed at [facility], [address]  [place], [name] [address]  C Difficile Quick Screen w PCR reflex     Status: None   Collection Time: 05/23/20  4:54 PM   Specimen: STOOL  Result Value Ref Range Status   C Diff antigen NEGATIVE NEGATIVE Final   C Diff toxin NEGATIVE NEGATIVE Final   C Diff interpretation No C. difficile detected.  Final    Comment: Performed at [facility], [place] [address], [place], [place] [address]     Radiology Studies: No results found.  Scheduled Meds: . ALPRAZolam  0.5 mg Oral QHS  . feeding supplement  1 Container Oral TID BM  . Gerhardt's butt cream   Topical TID  . heparin  5,000 Units Subcutaneous Q8H  . loperamide  4 mg Oral QID  . Mesalamine  400 mg Oral BID  . mometasone-formoterol  2 puff Inhalation BID  . multivitamin with minerals  1 tablet Oral Daily  . mycophenolate  1,000 mg Oral BID  . pantoprazole  40 mg Oral BID  . predniSONE  10 mg Oral q morning  . progesterone  100 mg Oral QHS  . psyllium  1 packet Oral BID  . pyridostigmine  30 mg Oral Daily  . rOPINIRole  1 mg Oral QHS,MR X 1  . rosuvastatin  10 mg Oral Daily  . sodium bicarbonate  1,300 mg Oral BID  . sodium chloride flush  3 mL Intravenous Q12H   Continuous Infusions: . sodium chloride 100 mL/hr at 05/27/20 1132  . chlorproMAZINE (THORAZINE) IV Stopped (05/25/20 0557)     LOS: 5 days   Marylu Lund, MD [facility] Pager On Amion  If 7PM-7AM, please contact night-coverage 05/27/2020, 2:15 PM

## 2020-05-27 NOTE — Progress Notes (Signed)
Pt. Able to achieve -40 nif at this time.

## 2020-05-28 ENCOUNTER — Inpatient Hospital Stay (HOSPITAL_COMMUNITY): Payer: Medicare HMO

## 2020-05-28 LAB — RENAL FUNCTION PANEL
Albumin: 2.7 g/dL — ABNORMAL LOW (ref 3.5–5.0)
Anion gap: 12 (ref 5–15)
BUN: 23 mg/dL — ABNORMAL HIGH (ref 6–20)
CO2: 18 mmol/L — ABNORMAL LOW (ref 22–32)
Calcium: 7.1 mg/dL — ABNORMAL LOW (ref 8.9–10.3)
Chloride: 112 mmol/L — ABNORMAL HIGH (ref 98–111)
Creatinine, Ser: 1.79 mg/dL — ABNORMAL HIGH (ref 0.44–1.00)
GFR, Estimated: 32 mL/min — ABNORMAL LOW (ref >60)
Glucose, Bld: 100 mg/dL — ABNORMAL HIGH (ref 70–99)
Phosphorus: 3.6 mg/dL (ref 2.5–4.6)
Potassium: 3.4 mmol/L — ABNORMAL LOW (ref 3.5–5.1)
Sodium: 142 mmol/L (ref 135–145)

## 2020-05-28 MED ORDER — PSYLLIUM 95 % PO PACK: 1.0000 | PACK | Freq: Two times a day (BID) | ORAL | 0 refills | Status: DC

## 2020-05-28 MED ORDER — LOPERAMIDE HCL 2 MG PO CAPS: 4.0000 mg | ORAL_CAPSULE | ORAL | 0 refills | Status: DC | PRN

## 2020-05-28 MED ORDER — LOPERAMIDE HCL 2 MG PO CAPS: 4.0000 mg | ORAL_CAPSULE | ORAL | Status: DC | PRN

## 2020-05-28 MED ORDER — POTASSIUM CHLORIDE CRYS ER 20 MEQ PO TBCR
40.0000 meq | EXTENDED_RELEASE_TABLET | Freq: Once | ORAL | Status: AC
  Administered 2020-05-28: 40 meq via ORAL
  Filled 2020-05-28: qty 2

## 2020-05-28 NOTE — Plan of Care (Signed)

## 2020-05-28 NOTE — Discharge Summary (Signed)
Physician Discharge Summary  Korah Hufstedler ZOX:[id number] DOB: 1961-01-06 DOA: 05/22/2020  PCP: [name], MD  Admit date: 05/22/2020 Discharge date: 05/28/2020  Admitted From: Home Disposition:  Home  Recommendations for Outpatient Follow-up:  1. Follow up with PCP in 1-2 weeks 2. Please obtain BMP/CBC in one week 3. Please follow up on the following pending results:  Discharge Condition:Improved CODE STATUS:Full Diet recommendation: Soft   Brief/Interim Summary: 60 y.o.femalewith medical history significant ofHTN, HLD, myasthenia gravis, fibromyalgia, asthma, restless leg syndrome, and Crohn's disease who presents with complaints of nausea and diarrhea over the last 2 weeks. Most of history is obtained from the patient's husband who is present at bedside at her request. Patient had L4-5 TLIF and right micro discectomy of L5-S1 on 2/15with Dr. [name] of neurosurgery. Patient stayed hospitalized for 3 days following surgery and was discharged home. She has been taking hydrocodone-acetaminophen 10 mg-325 mg up to 5 per daily for back pain. Over the last 2 weeks patient has complained of persistent nausea and has had no appetite. She reports everything taste and smells horrible. Patient has been taking Zofran almost around-the-clock. Denies any vomiting, but she has had dry heaves. Initially following the surgery she had been constipated, but after taking every laxatives and enemas she was finally able to have a bowel movement 7 days ago. However,since that time patient has had persistent diarrhea. Associated symptoms include possibly 12 pound weight loss, decreased urine output, intermittent hot/cold spells, constant hiccups, issues with her memory, intermittentlydisoriented at times, and using a walker to get around whenpreviously she couldambulate without assistance.  ED Course:On admission to the emergency department patient was seen to be afebrile relatively  stable vital signs. Labs significant for WBC 21.5K, platelets 460, potassium 3.3, CO2 16, BUN 92, creatinine 5.47, anion gap 20, and lipase 116. COVID-19 screening was negative. CT scan of the abdomen and pelvis only noted of the heterogeneityof the central uterus which may represent endometrial thickening/mass.  Discharge Diagnoses:  Principal Problem:   ARF (acute renal failure) (HCC) Active Problems:   Essential hypertension   Asthma   Leukocytosis   Hypokalemia   Myasthenia gravis (HCC)   Crohn disease (HCC)   GERD (gastroesophageal reflux disease)   Anxiety disorder   Chronic pain disorder   Diarrhea   Nausea   Acute intractable nausea and diarrhea, unclear etiology No CT evidence of Crohn's flare She states her symptoms do not feel like her typical flare from Crohn's disease. IV antiemetics continued as needed. EGD and colonoscopyfrom 3/7/22was nonrevealing. C. difficile PCR and GI panel negative.GI recommended continued Imodium as needed Continue Imodium 2 mg every 6 hours as recommended by GI. Will given IVF hydration Tolerated soft diet Per GI, improved symptoms of nausea and diarrhea with recommendation for close outpt f/u. Pt to f/u with Dr. Michail Sermon  Nonoliguric AKI likely prerenal in the setting of dehydration from diarrhea and poor oral intake from intractable nausea. AKI is improving, creatinine is downtrending from5.47 at presentation Baseline creatinine appears to be 0.9 with GFR greater than 60 Presented with creatinine greater than 5 with BUN of 92. Improved with IVF hydration Renal function improved  Resolved refractory hypokalemia likely from GI losses with diarrhea. Presented with serum potassium of 2.3 Continue daily replacement with ongoing diarrhea K replaced  Resolved post repletion: Hypomagnesemia likely from GI losses with diarrhea. Presented with serum magnesium 1.1 Serum magnesium 2.7.  Leukocytosis,no evidence of infective  process. C. difficile PCR and GI panel negative. GI  panel is neg C. difficile PCR was negative WBC had been downtrending  Hyperphosphatemia likely in the setting of renal failure Phosphorus 2.6 Cont to follow levels  Crohn's disease Continue home regimen. She was seen by GI, now signed off. Pt to follow up closely with GI as outpatient She is on mesalamine and prednisone  Hyperlipidemia Continued home Crestor.  Restless leg syndrome Continue home Requip.  Chronic anxiety Continue home regimen  GERD Continue home PPI twice daily.  Aortic atherosclerosis seen on CT scan. Continue home Crestor as tolerated  Endometrial thickening seen on CT scan Will need to follow-up with gynecology outpatient.  Dehydration -Dry mucus membranes on exam with poor skin turgor -Pt reports feeling "weaker" than her baseline -Suspect dehydration secondary to volume loss in the setting of above diarrhea with poor PO intake -Improved with IVF hydration  Discharge Instructions   Allergies as of 05/28/2020      Reactions   Duloxetine Nausea Only   Latex Other (See Comments)   BLISTER   Lorazepam Other (See Comments)   Hallucinations   Orange Concentrate [flavoring Agent] Other (See Comments)   Acid reflux   Other Hives, Nausea Only, Other (See Comments)   Some nuts cause a rash   Percocet [oxycodone-acetaminophen] Hives   Sulfa Antibiotics Hives   Bioflavonoids Rash   Causes burning of skin and blisters   Cefixime Other (See Comments)   Due to myasthenia gravis   Nucynta [tapentadol] Other (See Comments)   Headache       Medication List    STOP taking these medications   losartan 50 MG tablet Commonly known as: COZAAR   omega-3 acid ethyl esters 1 g capsule Commonly known as: LOVAZA   potassium chloride SA 20 MEQ tablet Commonly known as: KLOR-CON   triamterene-hydrochlorothiazide 37.5-25 MG capsule Commonly known as: DYAZIDE     TAKE these medications    AeroChamber Plus inhaler Use as instructed to use with inahaler.   albuterol 108 (90 Base) MCG/ACT inhaler Commonly known as: VENTOLIN HFA 1 PUFF EVERY 6 HOURS AS NEEDED FOR WHEEZING What changed: See the new instructions.   albuterol (2.5 MG/3ML) 0.083% nebulizer solution Commonly known as: PROVENTIL Take 2.5 mg by nebulization every 6 (six) hours as needed for shortness of breath. What changed: Another medication with the same name was changed. Make sure you understand how and when to take each.   ALPRAZolam 0.5 MG tablet Commonly known as: XANAX Take 0.5 mg by mouth at bedtime.   cyanocobalamin 1000 MCG/ML injection Commonly known as: (VITAMIN B-12) Inject 1,000 mcg into the muscle every 30 (thirty) days.   desmopressin 0.1 MG tablet Commonly known as: DDAVP Take 0.3 mg by mouth at bedtime.   estradiol 2 MG tablet Commonly known as: ESTRACE Take 2 mg by mouth at bedtime.   HYDROcodone-acetaminophen 10-325 MG tablet Commonly known as: NORCO Take 1 tablet by mouth 5 (five) times daily as needed for moderate pain.   loperamide 2 MG capsule Commonly known as: IMODIUM Take 2 capsules (4 mg total) by mouth as needed for diarrhea or loose stools.   Mesalamine 400 MG Cpdr DR capsule Commonly known as: ASACOL Take 400 mg by mouth 2 (two) times daily.   methocarbamol 750 MG tablet Commonly known as: ROBAXIN Take 750 mg by mouth at bedtime.   mycophenolate 500 MG tablet Commonly known as: CELLCEPT Take 1,000 mg by mouth 2 (two) times daily.   naloxone 4 MG/0.1ML Liqd nasal spray kit Commonly known  as: NARCAN Place 1 spray into the nose once as needed (opioid overdose).   ondansetron 4 MG tablet Commonly known as: ZOFRAN Take 1 tablet (4 mg total) by mouth every 8 (eight) hours as needed for nausea or vomiting.   pantoprazole 40 MG tablet Commonly known as: PROTONIX TAKE 1 TABLET DAILY What changed: when to take this   predniSONE 10 MG tablet Commonly known as:  DELTASONE Take 10 mg by mouth every morning.   progesterone 100 MG capsule Commonly known as: PROMETRIUM Take 100 mg by mouth at bedtime.   psyllium 95 % Pack Commonly known as: HYDROCIL/METAMUCIL Take 1 packet by mouth 2 (two) times daily.   pyridostigmine 60 MG tablet Commonly known as: MESTINON Take 30 mg by mouth daily.   rOPINIRole 1 MG tablet Commonly known as: REQUIP Take 1-2 tablets (1-2 mg total) by mouth at bedtime. What changed:   how much to take  when to take this  additional instructions   rosuvastatin 40 MG tablet Commonly known as: Crestor Take 1 tablet (40 mg total) by mouth daily.   Symbicort 80-4.5 MCG/ACT inhaler Generic drug: budesonide-formoterol Inhale 2 puffs into the lungs 2 (two) times daily. What changed:   when to take this  reasons to take this   Vitamin D (Ergocalciferol) 1.25 MG (50000 UNIT) Caps capsule Commonly known as: DRISDOL 1 CAPSULE EVERY 2 WEEKS What changed:   how much to take  how to take this  when to take this  additional instructions       Follow-up Information    Maisie Fus, MD. Schedule an appointment as soon as possible for a visit.   Specialty: Obstetrics and Gynecology Contact information: [place] [address] [phone]        [name], MD. Schedule an appointment as soon as possible for a visit in 2 week(s).   Specialty: Family Medicine Contact information: [place] [address] [phone]              Allergies  Allergen Reactions  . Duloxetine Nausea Only  . Latex Other (See Comments)    BLISTER  . Lorazepam Other (See Comments)    Hallucinations  . Orange Concentrate [Flavoring Agent] Other (See Comments)    Acid reflux  . Other Hives, Nausea Only and Other (See Comments)    Some nuts cause a rash  . Percocet [Oxycodone-Acetaminophen] Hives  . Sulfa Antibiotics Hives  . Bioflavonoids Rash    Causes burning  of skin and blisters  . Cefixime Other (See Comments)    Due to myasthenia gravis  . Nucynta [Tapentadol] Other (See Comments)    Headache     Consultations:  GI  Procedures/Studies: CT ABDOMEN PELVIS WO CONTRAST  Result Date: 05/22/2020 CLINICAL DATA:  60 year old female with abdominal and pelvic pain with nausea and vomiting for 2 weeks. History of lumbar surgery on 05/04/2020. History of inflammatory bowel disease. EXAM: CT ABDOMEN AND PELVIS WITHOUT CONTRAST TECHNIQUE: Multidetector CT imaging of the abdomen and pelvis was performed following the standard protocol without IV contrast. COMPARISON:  09/05/2017 CT FINDINGS: Please note that parenchymal abnormalities may be missed without intravenous contrast. Lower chest: No acute abnormality Hepatobiliary: Hepatic steatosis again identified without focal hepatic abnormality. The patient is status post cholecystectomy. No biliary dilatation. Pancreas: Unremarkable Spleen: Unremarkable Adrenals/Urinary Tract: The kidneys, adrenal glands and bladder are unremarkable. There is no evidence of hydronephrosis or urinary calculi. Stomach/Bowel: Stomach is within  normal limits. Appendix appears normal. No evidence of bowel wall thickening, distention, or inflammatory changes. Vascular/Lymphatic: Aortic atherosclerosis. No enlarged abdominal or pelvic lymph nodes. Reproductive: There is heterogeneity of the central uterus noted which may represent endometrial thickening/mass. The adnexal regions are unremarkable. Other: No ascites, focal collection or pneumoperitoneum. Musculoskeletal: Posterior fusion changes at L4-L5 noted. A thoracic cord stimulator is identified. No acute or suspicious bony findings are noted. IMPRESSION: 1. No evidence of acute abnormality. No bowel abnormalities identified. 2. Heterogeneity of the central uterus which may represent endometrial thickening/mass. Pelvic ultrasound is recommended for further evaluation. 3. Hepatic steatosis.  4. Aortic Atherosclerosis (ICD10-I70.0). Electronically Signed   By: Margarette Canada M.D.   On: 05/22/2020 10:13   DG Lumbar Spine 2-3 Views  Result Date: 05/04/2020 CLINICAL DATA:  Surgery, elective. Additional history provided: L4-5 TLIF. Provided fluoroscopy time 33 seconds (14.77 mGy). EXAM: LUMBAR SPINE - 2-3 VIEW; DG C-ARM 1-60 MIN COMPARISON:  MRI of the lumbar spine 12/26/2019. FINDINGS: PA and lateral view intraoperative fluoroscopic images of the lumbar spine are submitted, 3 images total. The images demonstrate bilateral pedicle screws at the L4 and L5 levels. Vertical interconnecting rods were not present at the time the images were taken. An interbody device is also now present at L4-L5. Overlying retractors. Partially imaged spinal stimulator device. IMPRESSION: 3 intraoperative fluoroscopic images of the lumbar spine from L4-L5 fusion, as described. Electronically Signed   By: Kellie Simmering DO   On: 05/04/2020 10:22   DG C-Arm 1-60 Min  Result Date: 05/04/2020 CLINICAL DATA:  Surgery, elective. Additional history provided: L4-5 TLIF. Provided fluoroscopy time 33 seconds (14.77 mGy). EXAM: LUMBAR SPINE - 2-3 VIEW; DG C-ARM 1-60 MIN COMPARISON:  MRI of the lumbar spine 12/26/2019. FINDINGS: PA and lateral view intraoperative fluoroscopic images of the lumbar spine are submitted, 3 images total. The images demonstrate bilateral pedicle screws at the L4 and L5 levels. Vertical interconnecting rods were not present at the time the images were taken. An interbody device is also now present at L4-L5. Overlying retractors. Partially imaged spinal stimulator device. IMPRESSION: 3 intraoperative fluoroscopic images of the lumbar spine from L4-L5 fusion, as described. Electronically Signed   By: Kellie Simmering DO   On: 05/04/2020 10:22   US PELVIC COMPLETE WITH TRANSVAGINAL  Result Date: 05/28/2020 CLINICAL DATA:  Mass seen on CT EXAM: TRANSABDOMINAL AND TRANSVAGINAL ULTRASOUND OF PELVIS TECHNIQUE: Both  transabdominal and transvaginal ultrasound examinations of the pelvis were performed. Transabdominal technique was performed for global imaging of the pelvis including uterus, ovaries, adnexal regions, and pelvic cul-de-sac. It was necessary to proceed with endovaginal exam following the transabdominal exam to visualize the ovaries. COMPARISON:  CT dated May 22, 2020 FINDINGS: Uterus Measurements: 8.1 x 3.1 x 3.2 cm = volume: 42 mL. No fibroids or other mass visualized. Endometrium The endometrium is difficult to accurately measure but appears to measure at least 10 mm in thickness. Multiple cysts are noted within the endometrium. Right ovary Not visualized Left ovary Not visualized Other findings No abnormal free fluid. IMPRESSION: 1. Suboptimal exam secondary to bowel gas. The ovaries were not visualized. 2. The endometrium was difficult to evaluate but it appears to be thickened with multiple cysts. Findings may be suggestive of cystic endometrial hyperplasia. Endometrial thickness is considered abnormal for an asymptomatic post-menopausal female. Endometrial sampling should be considered to exclude carcinoma. Electronically Signed   By: Constance Holster M.D.   On: 05/28/2020 14:15     Subjective: Eager to go  home  Discharge Exam: Vitals:   05/28/20 0410 05/28/20 1100  BP: 136/77 123/70  Pulse: 78 77  Resp: 18 17  Temp: 98.6 F (37 C) 98.3 F (36.8 C)  SpO2: 98% 98%   Vitals:   05/27/20 2038 05/28/20 0217 05/28/20 0410 05/28/20 1100  BP: 129/73  136/77 123/70  Pulse: 86  78 77  Resp: '18  18 17  ' Temp: 99.6 F (37.6 C)  98.6 F (37 C) 98.3 F (36.8 C)  TempSrc: Oral  Oral   SpO2: 98%  98% 98%  Weight:  67.8 kg    Height:        General: Pt is alert, awake, not in acute distress Cardiovascular: RRR, S1/S2 +, no rubs, no gallops Respiratory: CTA bilaterally, no wheezing, no rhonchi Abdominal: Soft, NT, ND, bowel sounds + Extremities: no edema, no cyanosis   The results of  significant diagnostics from this hospitalization (including imaging, microbiology, ancillary and laboratory) are listed below for reference.     Microbiology: Recent Results (from the past 240 hour(s))  SARS Coronavirus 2 by RT PCR (hospital order, performed in [place] hospital lab)     Status: None   Collection Time: 05/22/20  9:50 AM  Result Value Ref Range Status   SARS Coronavirus 2 NEGATIVE NEGATIVE Final    Comment: (NOTE) SARS-CoV-2 target nucleic acids are NOT DETECTED.  The SARS-CoV-2 RNA is generally detectable in upper and lower respiratory specimens during the acute phase of infection. The lowest concentration of SARS-CoV-2 viral copies this assay can detect is 250 copies / mL. A negative result does not preclude SARS-CoV-2 infection and should not be used as the sole basis for treatment or other patient management decisions.  A negative result may occur with improper specimen collection / handling, submission of specimen other than nasopharyngeal swab, presence of viral mutation(s) within the areas targeted by this assay, and inadequate number of viral copies (<250 copies / mL). A negative result must be combined with clinical observations, patient history, and epidemiological information.  Fact Sheet for Patients:   StrictlyIdeas.no  Fact Sheet for Healthcare Providers: BankingDealers.co.za  This test is not yet approved or  cleared by the [place] FDA and has been authorized for detection and/or diagnosis of SARS-CoV-2 by FDA under an Emergency Use Authorization (EUA).  This EUA will remain in effect (meaning this test can be used) for the duration of the COVID-19 declaration under Section 564(b)(1) of the Act, 21 U.S.C. section 360bbb-3(b)(1), unless the authorization is terminated or revoked sooner.  Performed at [facility], [address] [place], [name] [address]   Gastrointestinal Panel  by PCR , Stool     Status: None   Collection Time: 05/23/20  4:54 PM   Specimen: Stool  Result Value Ref Range Status   Campylobacter species NOT DETECTED NOT DETECTED Final   Plesimonas shigelloides NOT DETECTED NOT DETECTED Final   Salmonella species NOT DETECTED NOT DETECTED Final   Yersinia enterocolitica NOT DETECTED NOT DETECTED Final   Vibrio species NOT DETECTED NOT DETECTED Final   Vibrio cholerae NOT DETECTED NOT DETECTED Final   Enteroaggregative E coli (EAEC) NOT DETECTED NOT DETECTED Final   Enteropathogenic E coli (EPEC) NOT DETECTED NOT DETECTED Final   Enterotoxigenic E coli (ETEC) NOT DETECTED NOT DETECTED Final   Shiga like toxin producing E coli (STEC) NOT DETECTED NOT DETECTED Final   Shigella/Enteroinvasive E coli (EIEC) NOT DETECTED NOT DETECTED Final   Cryptosporidium NOT DETECTED  NOT DETECTED Final   Cyclospora cayetanensis NOT DETECTED NOT DETECTED Final   Entamoeba histolytica NOT DETECTED NOT DETECTED Final   Giardia lamblia NOT DETECTED NOT DETECTED Final   Adenovirus F40/41 NOT DETECTED NOT DETECTED Final   Astrovirus NOT DETECTED NOT DETECTED Final   Norovirus GI/GII NOT DETECTED NOT DETECTED Final   Rotavirus A NOT DETECTED NOT DETECTED Final   Sapovirus (I, II, IV, and V) NOT DETECTED NOT DETECTED Final    Comment: Performed at [facility], [address] [place], [place] [address]  C Difficile Quick Screen w PCR reflex     Status: None   Collection Time: 05/23/20  4:54 PM   Specimen: STOOL  Result Value Ref Range Status   C Diff antigen NEGATIVE NEGATIVE Final   C Diff toxin NEGATIVE NEGATIVE Final   C Diff interpretation No C. difficile detected.  Final    Comment: Performed at [facility], [place] [address] [place],  [address]     Labs: BNP (last 3 results) No results for input(s): BNP in the last 8760 hours. Basic Metabolic Panel: Recent Labs  Lab 05/23/20 0247 05/23/20 0514 05/23/20 2258 05/24/20 0802  05/25/20 0357 05/26/20 0442 05/27/20 0223 05/28/20 0307  NA 136  --   --  139 140 142 142 142  K 2.3*   < > 2.9* 3.1* 3.8 3.9 4.3 3.4*  CL 101  --   --  108 112* 114* 112* 112*  CO2 19*  --   --  17* 16* 17* 18* 18*  GLUCOSE 103*  --   --  105* 86 101* 113* 100*  BUN 67*  --   --  43* 31* 25* 25* 23*  CREATININE 4.16*  --   --  3.06* 2.55* 2.16* 2.14* 1.79*  CALCIUM 8.5*  --   --  8.7* 8.7* 8.6* 7.8* 7.1*  MG 1.1*  --  3.3* 2.7*  --   --   --   --   PHOS 5.2*  --   --   --  2.6 2.6 2.5 3.6   < > = values in this interval not displayed.   Liver Function Tests: Recent Labs  Lab 05/22/20 0800 05/25/20 0357 05/26/20 0442 05/27/20 0223 05/28/20 0307  AST 13*  --   --  21  --   ALT 7  --   --  12  --   ALKPHOS 71  --   --  49  --   BILITOT 0.5  --   --  0.7  --   PROT 9.0*  --   --  5.6*  --   ALBUMIN 4.4 2.8* 2.8* 2.8* 2.7*   Recent Labs  Lab 05/22/20 0800  LIPASE 116*   No results for input(s): AMMONIA in the last 168 hours. CBC: Recent Labs  Lab 05/22/20 0800 05/23/20 0247 05/24/20 0802 05/27/20 0223  WBC 21.5* 14.9* 12.4* 12.3*  NEUTROABS 15.4*  --   --   --   HGB 12.9 10.0* 10.1* 8.4*  HCT 37.4 27.9* 29.3* 25.8*  MCV 93.7 90.6 93.0 99.2  PLT 460* 269 218 169   Cardiac Enzymes: No results for input(s): CKTOTAL, CKMB, CKMBINDEX, TROPONINI in the last 168 hours. BNP: Invalid input(s): POCBNP CBG: No results for input(s): GLUCAP in the last 168 hours. D-Dimer No results for input(s): DDIMER in the last 72 hours. Hgb A1c No results for input(s): HGBA1C in the last 72 hours. Lipid Profile No results for  input(s): CHOL, HDL, LDLCALC, TRIG, CHOLHDL, LDLDIRECT in the last 72 hours. Thyroid function studies No results for input(s): TSH, T4TOTAL, T3FREE, THYROIDAB in the last 72 hours.  Invalid input(s): FREET3 Anemia work up No results for input(s): VITAMINB12, FOLATE, FERRITIN, TIBC, IRON, RETICCTPCT in the last 72 hours. Urinalysis    Component Value  Date/Time   COLORURINE AMBER (A) 02/17/2019 0305   APPEARANCEUR CLOUDY (A) 02/17/2019 0305   LABSPEC >1.030 (H) 02/17/2019 0305   PHURINE 5.5 02/17/2019 0305   GLUCOSEU NEGATIVE 02/17/2019 0305   HGBUR MODERATE (A) 02/17/2019 0305   BILIRUBINUR SMALL (A) 02/17/2019 0305   KETONESUR NEGATIVE 02/17/2019 0305   PROTEINUR 100 (A) 02/17/2019 0305   NITRITE NEGATIVE 02/17/2019 0305   LEUKOCYTESUR NEGATIVE 02/17/2019 0305   Sepsis Labs Invalid input(s): PROCALCITONIN,  WBC,  LACTICIDVEN Microbiology Recent Results (from the past 240 hour(s))  SARS Coronavirus 2 by RT PCR (hospital order, performed in [place] hospital lab)     Status: None   Collection Time: 05/22/20  9:50 AM  Result Value Ref Range Status   SARS Coronavirus 2 NEGATIVE NEGATIVE Final    Comment: (NOTE) SARS-CoV-2 target nucleic acids are NOT DETECTED.  The SARS-CoV-2 RNA is generally detectable in upper and lower respiratory specimens during the acute phase of infection. The lowest concentration of SARS-CoV-2 viral copies this assay can detect is 250 copies / mL. A negative result does not preclude SARS-CoV-2 infection and should not be used as the sole basis for treatment or other patient management decisions.  A negative result may occur with improper specimen collection / handling, submission of specimen other than nasopharyngeal swab, presence of viral mutation(s) within the areas targeted by this assay, and inadequate number of viral copies (<250 copies / mL). A negative result must be combined with clinical observations, patient history, and epidemiological information.  Fact Sheet for Patients:   StrictlyIdeas.no  Fact Sheet for Healthcare Providers: BankingDealers.co.za  This test is not yet approved or  cleared by the [place] FDA and has been authorized for detection and/or diagnosis of SARS-CoV-2 by FDA under an Emergency Use Authorization (EUA).   This EUA will remain in effect (meaning this test can be used) for the duration of the COVID-19 declaration under Section 564(b)(1) of the Act, 21 U.S.C. section 360bbb-3(b)(1), unless the authorization is terminated or revoked sooner.  Performed at [facility], [address] [place], [name] [address]   Gastrointestinal Panel by PCR , Stool     Status: None   Collection Time: 05/23/20  4:54 PM   Specimen: Stool  Result Value Ref Range Status   Campylobacter species NOT DETECTED NOT DETECTED Final   Plesimonas shigelloides NOT DETECTED NOT DETECTED Final   Salmonella species NOT DETECTED NOT DETECTED Final   Yersinia enterocolitica NOT DETECTED NOT DETECTED Final   Vibrio species NOT DETECTED NOT DETECTED Final   Vibrio cholerae NOT DETECTED NOT DETECTED Final   Enteroaggregative E coli (EAEC) NOT DETECTED NOT DETECTED Final   Enteropathogenic E coli (EPEC) NOT DETECTED NOT DETECTED Final   Enterotoxigenic E coli (ETEC) NOT DETECTED NOT DETECTED Final   Shiga like toxin producing E coli (STEC) NOT DETECTED NOT DETECTED Final   Shigella/Enteroinvasive E coli (EIEC) NOT DETECTED NOT DETECTED Final   Cryptosporidium NOT DETECTED NOT DETECTED Final   Cyclospora cayetanensis NOT DETECTED NOT DETECTED Final   Entamoeba histolytica NOT DETECTED NOT DETECTED Final   Giardia [place] NOT DETECTED NOT DETECTED Final  Adenovirus F40/41 NOT DETECTED NOT DETECTED Final   Astrovirus NOT DETECTED NOT DETECTED Final   Norovirus GI/GII NOT DETECTED NOT DETECTED Final   Rotavirus A NOT DETECTED NOT DETECTED Final   Sapovirus (I, II, IV, and V) NOT DETECTED NOT DETECTED Final    Comment: Performed at [facility], [address], [place], [place] [address]  C Difficile Quick Screen w PCR reflex     Status: None   Collection Time: 05/23/20  4:54 PM   Specimen: STOOL  Result Value Ref Range Status   C Diff antigen NEGATIVE NEGATIVE Final   C Diff toxin NEGATIVE NEGATIVE  Final   C Diff interpretation No C. difficile detected.  Final    Comment: Performed at [facility], [place] [address] [place], [place] [address]   Time spent: 30 min  SIGNED:   Marylu Lund, MD  [facility] 05/28/2020, 3:42 PM  If 7PM-7AM, please contact night-coverage

## 2020-05-28 NOTE — TOC Initial Note (Signed)
Transition of Care [facility]) - Initial/Assessment Note    Patient Details  Name: Teresa Lee MRN: [id number] Date of Birth: 1960-10-05  Transition [facility]) CM/SW Contact:    Zenon Mayo, RN Phone Number: 05/28/2020, 4:31 PM  Clinical Narrative:                 Patient is for dc home today, she has no needs.   Expected Discharge Plan: Home/Self Care Barriers to Discharge: No Barriers Identified   Patient Goals and CMS Choice Patient states their goals for this hospitalization and ongoing recovery are:: home   Choice offered to / list presented to : NA  Expected Discharge Plan and Services Expected Discharge Plan: Home/Self Care In-house Referral: NA Discharge Planning Services: CM Consult   Living arrangements for the past 2 months: Single Family Home Expected Discharge Date: 05/28/20                 DME Agency: NA       HH Arranged: NA          Prior Living Arrangements/Services Living arrangements for the past 2 months: Single Family Home Lives with:: Spouse Patient language and need for interpreter reviewed:: Yes Do you feel safe going back to the place where you live?: Yes      Need for Family Participation in Patient Care: Yes (Comment) Care giver support system in place?: Yes (comment)   Criminal Activity/Legal Involvement Pertinent to Current Situation/Hospitalization: No - Comment as needed  Activities of Daily Living Home Assistive Devices/Equipment: Eyeglasses,Brace (specify type) ADL Screening (condition at time of admission) Patient's cognitive ability adequate to safely complete daily activities?: Yes Is the patient deaf or have difficulty hearing?: No Does the patient have difficulty seeing, even when wearing glasses/contacts?: No Does the patient have difficulty concentrating, remembering, or making decisions?: No Patient able to express need for assistance with ADLs?: Yes Does the patient have difficulty dressing or  bathing?: No Independently performs ADLs?: Yes (appropriate for developmental age) Does the patient have difficulty walking or climbing stairs?: No Weakness of Legs: None Weakness of Arms/Hands: None  Permission Sought/Granted                  Emotional Assessment       Orientation: : Oriented to Self,Oriented to Place,Oriented to  Time,Oriented to Situation Alcohol / Substance Use: Not Applicable Psych Involvement: No (comment)  Admission diagnosis:  AKI (acute kidney injury) ([place]) [N17.9] Nausea vomiting and diarrhea [R11.2, R19.7] ARF (acute renal failure) ([place]) [N17.9] Patient Active Problem List   Diagnosis Date Noted  . ARF (acute renal failure) ([place]) 05/22/2020  . Diarrhea 05/22/2020  . Nausea 05/22/2020  . Spondylolisthesis of lumbar region 05/04/2020  . Chronic pain disorder 08/12/2019  . COVID-19   . Acute on chronic renal failure ([place]) 02/16/2019  . Insomnia disorder 02/27/2018  . Fatigue 02/27/2018  . Fibromyalgia syndrome 04/30/2017  . RLS (restless legs syndrome) 12/26/2016  . B12 deficiency 12/26/2016  . Vitamin D deficiency, unspecified 12/26/2016  . GERD (gastroesophageal reflux disease) 12/26/2016  . Anxiety disorder 12/26/2016  . Hyponatremia 10/24/2016  . AKI (acute kidney injury) ([place]) 10/23/2016  . Hypomagnesemia 10/22/2016  . Hypophosphatemia 10/22/2016  . Hypokalemia 10/21/2016  . Wound infection after surgery 10/21/2016  . Sepsis ([place]) 10/21/2016  . Myasthenia gravis ([place]) 10/21/2016  . Melanoma ([place]) 10/21/2016  . Crohn disease ([place]) 10/21/2016  . Cellulitis 10/21/2016  . Palpitations 08/25/2013  . Leukocytosis 05/25/2011  . Hyperlipidemia  04/20/2007  . Essential hypertension 04/20/2007  . Asthma 04/20/2007  . CARPAL TUNNEL SYNDROME, HX OF 04/20/2007   PCP:  [name], MD Pharmacy:   [facility], [place] - [address] [name] [address] Phone: [phone] Fax: [place], [address] [place] [address] Phone: [phone] Fax: [phone]     Social Determinants of Health (SDOH) Interventions    Readmission Risk Interventions Readmission Risk Prevention Plan 05/28/2020  Transportation Screening Complete  Medication Review ([place]) Complete  HRI or [place] Complete  SW Recovery Care/Counseling Consult Complete  [place] Not Applicable  Some recent data might be hidden

## 2020-05-28 NOTE — Progress Notes (Signed)
D/C instructions given and reviewed. Tele and IV removed, tolerated well. Family in room to transport home.

## 2020-05-28 NOTE — Plan of Care (Signed)

## 2020-05-28 NOTE — Care Management Important Message (Signed)
Important Message  Patient Details  Name: Teresa Lee MRN: [id number] Date of Birth: Aug 12, 1960   Medicare Important Message Given:  Yes     Shelda Altes 05/28/2020, 9:59 AM

## 2020-06-02 ENCOUNTER — Emergency Department (HOSPITAL_COMMUNITY)
Admission: EM | Admit: 2020-06-02 | Discharge: 2020-06-03 | Disposition: A | Discharge status: Home / Self Care | Payer: Medicare HMO | Source: Home / Self Care | Attending: Emergency Medicine | Admitting: Emergency Medicine

## 2020-06-02 ENCOUNTER — Encounter (HOSPITAL_COMMUNITY): Payer: Self-pay | Admitting: Emergency Medicine

## 2020-06-02 ENCOUNTER — Emergency Department (HOSPITAL_COMMUNITY): Payer: Medicare HMO

## 2020-06-02 ENCOUNTER — Other Ambulatory Visit: Payer: Self-pay

## 2020-06-02 DIAGNOSIS — R509 Fever, unspecified: Secondary | ICD-10-CM | POA: Diagnosis not present

## 2020-06-02 DIAGNOSIS — Z85828 Personal history of other malignant neoplasm of skin: Secondary | ICD-10-CM | POA: Insufficient documentation

## 2020-06-02 DIAGNOSIS — Y92002 Bathroom of unspecified non-institutional (private) residence single-family (private) house as the place of occurrence of the external cause: Secondary | ICD-10-CM | POA: Insufficient documentation

## 2020-06-02 DIAGNOSIS — R1084 Generalized abdominal pain: Secondary | ICD-10-CM | POA: Insufficient documentation

## 2020-06-02 DIAGNOSIS — M25551 Pain in right hip: Secondary | ICD-10-CM | POA: Insufficient documentation

## 2020-06-02 DIAGNOSIS — R42 Dizziness and giddiness: Secondary | ICD-10-CM | POA: Insufficient documentation

## 2020-06-02 DIAGNOSIS — J45909 Unspecified asthma, uncomplicated: Secondary | ICD-10-CM | POA: Insufficient documentation

## 2020-06-02 DIAGNOSIS — W19XXXA Unspecified fall, initial encounter: Secondary | ICD-10-CM

## 2020-06-02 DIAGNOSIS — M542 Cervicalgia: Secondary | ICD-10-CM | POA: Insufficient documentation

## 2020-06-02 DIAGNOSIS — Z8616 Personal history of COVID-19: Secondary | ICD-10-CM | POA: Insufficient documentation

## 2020-06-02 DIAGNOSIS — W182XXA Fall in (into) shower or empty bathtub, initial encounter: Secondary | ICD-10-CM | POA: Insufficient documentation

## 2020-06-02 DIAGNOSIS — R55 Syncope and collapse: Secondary | ICD-10-CM

## 2020-06-02 DIAGNOSIS — M545 Low back pain, unspecified: Secondary | ICD-10-CM | POA: Insufficient documentation

## 2020-06-02 DIAGNOSIS — I10 Essential (primary) hypertension: Secondary | ICD-10-CM | POA: Insufficient documentation

## 2020-06-02 DIAGNOSIS — R112 Nausea with vomiting, unspecified: Secondary | ICD-10-CM | POA: Insufficient documentation

## 2020-06-02 DIAGNOSIS — A09 Infectious gastroenteritis and colitis, unspecified: Secondary | ICD-10-CM | POA: Diagnosis not present

## 2020-06-02 DIAGNOSIS — Z79899 Other long term (current) drug therapy: Secondary | ICD-10-CM | POA: Insufficient documentation

## 2020-06-02 DIAGNOSIS — Z91040 Latex allergy status: Secondary | ICD-10-CM | POA: Insufficient documentation

## 2020-06-02 DIAGNOSIS — S0083XA Contusion of other part of head, initial encounter: Secondary | ICD-10-CM | POA: Insufficient documentation

## 2020-06-02 DIAGNOSIS — R197 Diarrhea, unspecified: Secondary | ICD-10-CM | POA: Insufficient documentation

## 2020-06-02 LAB — CBC WITH DIFFERENTIAL/PLATELET
Basophils Absolute: 0.2 10*3/uL — ABNORMAL HIGH (ref 0.0–0.1)
Basophils Relative: 1 %
Eosinophils Absolute: 0.3 10*3/uL (ref 0.0–0.5)
Eosinophils Relative: 2 %
HCT: 32.2 % — ABNORMAL LOW (ref 36.0–46.0)
Hemoglobin: 10.4 g/dL — ABNORMAL LOW (ref 12.0–15.0)
Lymphocytes Relative: 14 %
Lymphs Abs: 2.2 10*3/uL (ref 0.7–4.0)
MCH: 31.7 pg (ref 26.0–34.0)
MCHC: 32.3 g/dL (ref 30.0–36.0)
MCV: 98.2 fL (ref 80.0–100.0)
Metamyelocytes Relative: 2 %
Monocytes Absolute: 0.6 10*3/uL (ref 0.1–1.0)
Monocytes Relative: 4 %
Myelocytes: 4 %
Neutro Abs: 11.3 10*3/uL — ABNORMAL HIGH (ref 1.7–7.7)
Neutrophils Relative %: 72 %
Platelets: 286 10*3/uL (ref 150–400)
Promyelocytes Relative: 1 %
RBC: 3.28 MIL/uL — ABNORMAL LOW (ref 3.87–5.11)
RDW: 14.2 % (ref 11.5–15.5)
WBC: 15.7 10*3/uL — ABNORMAL HIGH (ref 4.0–10.5)
nRBC: 0 % (ref 0.0–0.2)

## 2020-06-02 LAB — COMPREHENSIVE METABOLIC PANEL
ALT: 29 U/L (ref 0–44)
AST: 36 U/L (ref 15–41)
Albumin: 3.6 g/dL (ref 3.5–5.0)
Alkaline Phosphatase: 85 U/L (ref 38–126)
Anion gap: 17 — ABNORMAL HIGH (ref 5–15)
BUN: 19 mg/dL (ref 6–20)
CO2: 20 mmol/L — ABNORMAL LOW (ref 22–32)
Calcium: 7.6 mg/dL — ABNORMAL LOW (ref 8.9–10.3)
Chloride: 104 mmol/L (ref 98–111)
Creatinine, Ser: 1.15 mg/dL — ABNORMAL HIGH (ref 0.44–1.00)
GFR, Estimated: 55 mL/min — ABNORMAL LOW (ref >60)
Glucose, Bld: 112 mg/dL — ABNORMAL HIGH (ref 70–99)
Potassium: 2.9 mmol/L — ABNORMAL LOW (ref 3.5–5.1)
Sodium: 141 mmol/L (ref 135–145)
Total Bilirubin: 0.5 mg/dL (ref 0.3–1.2)
Total Protein: 7 g/dL (ref 6.5–8.1)

## 2020-06-02 LAB — LIPASE, BLOOD: Lipase: 40 U/L (ref 11–51)

## 2020-06-02 MED ORDER — IOHEXOL 300 MG/ML  SOLN
100.0000 mL | Freq: Once | INTRAMUSCULAR | Status: AC | PRN
  Administered 2020-06-02: 100 mL via INTRAVENOUS

## 2020-06-02 MED ORDER — SODIUM CHLORIDE 0.9 % IV BOLUS
1000.0000 mL | Freq: Once | INTRAVENOUS | Status: AC
  Administered 2020-06-02: 1000 mL via INTRAVENOUS

## 2020-06-02 MED ORDER — PROMETHAZINE HCL 25 MG/ML IJ SOLN
INTRAMUSCULAR | Status: AC
  Filled 2020-06-02: qty 1

## 2020-06-02 MED ORDER — DROPERIDOL 2.5 MG/ML IJ SOLN
1.2500 mg | Freq: Once | INTRAMUSCULAR | Status: AC
  Administered 2020-06-02: 1.25 mg via INTRAVENOUS
  Filled 2020-06-02: qty 2

## 2020-06-02 MED ORDER — POTASSIUM CHLORIDE ER 20 MEQ PO TBCR: 20.0000 meq | EXTENDED_RELEASE_TABLET | Freq: Every day | ORAL | 0 refills | Status: DC

## 2020-06-02 MED ORDER — SODIUM CHLORIDE 0.9 % IV SOLN
12.5000 mg | Freq: Once | INTRAVENOUS | Status: AC
  Administered 2020-06-02: 12.5 mg via INTRAVENOUS
  Filled 2020-06-02: qty 0.5

## 2020-06-02 NOTE — Discharge Instructions (Addendum)
Your lab work and imaging look reassuring.  Noted your potassium is slightly decreased start you on a supplement for potassium please take as prescribed.  It is noted that you have a prescription for Phenergan please use as needed for nausea and vomiting.  I would like you to start a bland or brat diet for the next couple of days have given you information above please read  please follow-up with a GI doctor for further evaluation.  Come back to the emergency department if you develop chest pain, shortness of breath, severe abdominal pain, uncontrolled nausea, vomiting, diarrhea.

## 2020-06-02 NOTE — ED Provider Notes (Signed)
Patient was received at handoff due to shift change from [facility] , PA-C she provided HPI, current work-up, likely disposition.  Please see her note for full detail.  In short patient with significant medical history of Crohn's, GERD, recent lumbar fusion presents emergency department after a possible syncopal episode.  She endorses that she felt dizzy and fell to the floor while she was in the shower.  Unsure whether she lost consciousness or not.  She states that she has been having uncontrolled nausea and vomiting for last couple days.  She denies any alleviating factors.  She denies recent antibiotic use, has tested negative for C. Difficile.  Work-up reveals leukocytosis 15.7 appears to be at baseline for patient, normocytic anemia appears to be at baseline, CMP shows hypokalemia 2.9, slight decrease in CO2 of 20, creatinine 1.15 appears to be a baseline lipase 40.  Imaging are pending at this time.  Per prior providers instruction follow-up on lab work and imaging if all unremarkable patient tolerated p.o. patient be discharged home follow-up with GI Physical Exam  BP (!) 164/89   Pulse 96   Temp 98.1 F (36.7 C)   Resp 17   Ht 5' (1.524 m)   Wt 67 kg   SpO2 100%   BMI 28.85 kg/m   Physical Exam Vitals and nursing note reviewed.  Constitutional:      General: She is not in acute distress.    Appearance: Normal appearance. She is not ill-appearing or diaphoretic.  HENT:     Head: Normocephalic and atraumatic.     Nose: No congestion.  Eyes:     Conjunctiva/sclera: Conjunctivae normal.  Cardiovascular:     Rate and Rhythm: Normal rate and regular rhythm.  Pulmonary:     Effort: Pulmonary effort is normal.     Breath sounds: Normal breath sounds.  Musculoskeletal:     Cervical back: Neck supple.     Right lower leg: No edema.     Left lower leg: No edema.  Skin:    General: Skin is warm and dry.     Coloration: Skin is not jaundiced or pale.  Neurological:      Mental Status: She is alert.  Psychiatric:        Mood and Affect: Mood normal.     ED Course/Procedures     Procedures  MDM  CT head and C-spine negative for acute findings.  CT abdomen pelvis negative for acute findings, CT lumbar spine negative for acute findings.  Chest x-ray and hip negative for acute findings  Patient has not vomited since being in the emergency department continues to have nausea  will provide her with droperidol.  Vital signs have remained stable, patient has no complaints at this time.  Patient agreeable for discharge.   Suspect patient suffering from  gastroenteritis will encourage her to continue with her antiemetics, started on oral potassium as she is hypokalemic secondary due to vomiting and diarrhea.  Have her follow-up with GI for further evaluation  Vital signs have remained stable, no indication for hospital admission.  Patient discussed with attending and they agreed with assessment and plan.  Patient given at home care as well strict return precautions.  Patient verbalized that they understood agreed to said plan.        Marcello Fennel, PA-C 06/02/20 [id number]    Fredia Sorrow, MD 06/09/20 [phone]

## 2020-06-02 NOTE — ED Triage Notes (Signed)
Pt had recent back surgery. Took half of a pain pill, took a shower and got dizzy. Fell hitting her head. Abrasion and bruising noted to right side of head. A/o x4. Denies loc

## 2020-06-02 NOTE — ED Provider Notes (Signed)
[facility] Provider Note   CSN: [id number] Arrival date & time: 06/02/20  1801     History Chief Complaint  Patient presents with  . Fall    Catilyn Lee is a 60 y.o. female with past medical history significant for Crohn's, GERD, myasthenia gravis legal blindness in left eye, recent lumbar fusion 05/04/2020 who presents for evaluation of possible syncope.  Patient recently discharged from the hospital 5 days ago for acute renal failure in setting of nausea, vomiting and diarrhea.  Had Noncon CT scan at that time which did not not show any significant pathology.  Patient denies any melena or bright blood per rectum.  She feels like she was discharged too soon.  Is still persistently having diarrhea.  Having to take Imodium.  Having up to 6 bowel movements daily.  Was in the shower earlier today, felt lightheaded and subsequently went down.  States she was able to call for her husband however he recently had surgery was not able to catch her.  She admits to hitting the right side of her head, has posterior neck pain, lumbar pain as well as some mild right hip pain.  Was not able to get off the ground.  She denies any anticoagulation.  She any vision changes, chest pain, shortness of breath prior to syncope.  She denies any paresthesias or weakness.  She has been taking Phenergan which helps with her emesis her states she is still persistently nauseous.  Has overall decreased appetite.  She was seen by PCP 2 days ago who was concerned as she was still having episodes of diarrhea and persistent nausea.  She denies prior history of syncope.  Has additional aggravating or alleviating factors.  History obtained from patient and past medical records.  No interpreter used.   HPI     Past Medical History:  Diagnosis Date  . Arthritis    osteoarthritis  . Asthma   . Blind left eye   . Cancer ([place])    skin  . Crohn's disease ([place])   . CTS (carpal tunnel syndrome)   .  DJD (degenerative joint disease)    Both cervical spine and LS spine  . Fibromyalgia   . GERD (gastroesophageal reflux disease)   . Heart murmur   . Herniated nucleus pulposus   . Hyperlipidemia   . Hypertension   . IBS (irritable bowel syndrome)   . Leukocytosis   . Myasthenia gravis   . Myasthenia gravis ([place])   . Myasthenia gravis ([place])   . Pneumonia   . Spinal cord stimulator status     Patient Active Problem List   Diagnosis Date Noted  . ARF (acute renal failure) ([place]) 05/22/2020  . Diarrhea 05/22/2020  . Nausea 05/22/2020  . Spondylolisthesis of lumbar region 05/04/2020  . Chronic pain disorder 08/12/2019  . COVID-19   . Acute on chronic renal failure ([place]) 02/16/2019  . Insomnia disorder 02/27/2018  . Fatigue 02/27/2018  . Fibromyalgia syndrome 04/30/2017  . RLS (restless legs syndrome) 12/26/2016  . B12 deficiency 12/26/2016  . Vitamin D deficiency, unspecified 12/26/2016  . GERD (gastroesophageal reflux disease) 12/26/2016  . Anxiety disorder 12/26/2016  . Hyponatremia 10/24/2016  . AKI (acute kidney injury) ([place]) 10/23/2016  . Hypomagnesemia 10/22/2016  . Hypophosphatemia 10/22/2016  . Hypokalemia 10/21/2016  . Wound infection after surgery 10/21/2016  . Sepsis ([place]) 10/21/2016  . Myasthenia gravis ([place]) 10/21/2016  . Melanoma ([place]) 10/21/2016  . Crohn disease ([place]) 10/21/2016  . Cellulitis  10/21/2016  . Palpitations 08/25/2013  . Leukocytosis 05/25/2011  . Hyperlipidemia 04/20/2007  . Essential hypertension 04/20/2007  . Asthma 04/20/2007  . CARPAL TUNNEL SYNDROME, HX OF 04/20/2007    Past Surgical History:  Procedure Laterality Date  . BACK SURGERY    . BIOPSY  05/24/2020   Procedure: BIOPSY;  Surgeon: Ronald Lobo, MD;  Location: [facility];  Service: Endoscopy;;  . BREAST REDUCTION SURGERY    . CARPAL TUNNEL RELEASE     bilateral  . cervical disc inj    . CHOLECYSTECTOMY    . COLONOSCOPY WITH PROPOFOL N/A 05/24/2020   Procedure:  COLONOSCOPY WITH PROPOFOL;  Surgeon: Ronald Lobo, MD;  Location: [facility];  Service: Endoscopy;  Laterality: N/A;  . ENDOMETRIAL ABLATION W/ NOVASURE    . ESOPHAGOGASTRODUODENOSCOPY (EGD) WITH PROPOFOL N/A 05/24/2020   Procedure: ESOPHAGOGASTRODUODENOSCOPY (EGD) WITH PROPOFOL;  Surgeon: Ronald Lobo, MD;  Location: [facility];  Service: Endoscopy;  Laterality: N/A;  . LUMBAR LAMINECTOMY/DECOMPRESSION MICRODISCECTOMY Right 05/04/2020   Procedure: Right Lumbar Five Sacral One Microdiscectomy;  Surgeon: Erline Levine, MD;  Location: [facility];  Service: Neurosurgery;  Laterality: Right;  posterior  . MELANOMA EXCISION    . NASAL SINUS SURGERY    . SPINAL CORD STIMULATOR INSERTION  2019  . THYMECTOMY     due to myastenia gravis  . THYMECTOMY    . TRANSFORAMINAL LUMBAR INTERBODY FUSION (TLIF) WITH PEDICLE SCREW FIXATION 1 LEVEL Right 05/04/2020   Procedure: Right Lumbar Four-Five Transforaminal lumbar interbody fusion;  Surgeon: Erline Levine, MD;  Location: [facility];  Service: Neurosurgery;  Laterality: Right;  posterior     OB History   No obstetric history on file.     Family History  Problem Relation Age of Onset  . Asthma Daughter   . Heart disease Mother   . Cancer Mother   . Hyperlipidemia Mother   . Hypertension Mother   . Diabetes Father     Social History   Tobacco Use  . Smoking status: Never Smoker  . Smokeless tobacco: Never Used  Vaping Use  . Vaping Use: Never used  Substance Use Topics  . Alcohol use: Yes    Comment: occassional wine  . Drug use: No    Home Medications Prior to Admission medications   Medication Sig Start Date End Date Taking? Authorizing Provider  albuterol (PROVENTIL) (2.5 MG/3ML) 0.083% nebulizer solution Take 2.5 mg by nebulization every 6 (six) hours as needed for shortness of breath. 12/26/19  Yes [provider]  albuterol (VENTOLIN HFA) 108 (90 Base) MCG/ACT inhaler 1 PUFF EVERY 6 HOURS AS NEEDED FOR WHEEZING Patient  taking differently: Inhale 1 puff into the lungs every 6 (six) hours as needed for wheezing or shortness of breath. 09/05/19  Yes [place], Betty G, MD  ALPRAZolam Duanne Moron) 0.5 MG tablet Take 0.5 mg by mouth at bedtime.   Yes [provider]  Calcium Carbonate (CALCIUM 500 PO) Take 1 tablet by mouth daily.   Yes [provider]  estradiol (ESTRACE) 2 MG tablet Take 2 mg by mouth at bedtime.   Yes [provider]  HYDROcodone-acetaminophen (NORCO) 10-325 MG tablet Take 1 tablet by mouth 5 (five) times daily as needed for moderate pain. 08/25/17  Yes [provider]  loperamide (IMODIUM) 2 MG capsule Take 2 capsules (4 mg total) by mouth as needed for diarrhea or loose stools. 05/28/20  Yes Donne Hazel, MD  magnesium oxide (MAG-OX) 400 MG tablet Take 400 mg by mouth daily.  Yes [provider]  Mesalamine (ASACOL) 400 MG CPDR DR capsule Take 400 mg by mouth 2 (two) times daily. 12/10/18  Yes [provider]  methocarbamol (ROBAXIN) 750 MG tablet Take 750 mg by mouth at bedtime. 02/07/19  Yes [provider]  mupirocin ointment (BACTROBAN) 2 % Apply 1 application topically 2 (two) times daily. 05/31/20 05/31/21 Yes [provider]  mycophenolate (CELLCEPT) 500 MG tablet Take 1,000 mg by mouth 2 (two) times daily.    Yes [provider]  naloxone (NARCAN) nasal spray 4 mg/0.1 mL Place 1 spray into the nose once as needed (opioid overdose).   Yes [provider]  promethazine (PHENERGAN) 25 MG tablet Take 25 mg by mouth every 6 (six) hours as needed. 05/31/20  Yes [provider]  PROMETHEGAN 25 MG suppository Place 25 mg rectally every 6 (six) hours as needed. 05/31/20  Yes [provider]  rOPINIRole (REQUIP) 1 MG tablet Take 1-2 tablets (1-2 mg total) by mouth at bedtime. Patient taking differently: Take 1 mg by mouth See admin instructions. Take 1 tablet (1 mg) by mouth daily at bedtime, may take a 2nd  tablet (1 mg) later if still needed for restless legs 08/08/19  Yes [place]ique, Betty G, MD  rosuvastatin (CRESTOR) 40 MG tablet Take 1 tablet (40 mg total) by mouth daily. 02/02/19  Yes [place], Betty G, MD  Spacer/Aero-Holding Chambers (AEROCHAMBER PLUS) inhaler Use as instructed to use with inahaler. 08/30/18  Yes [place], Betty G, MD  SYMBICORT 80-4.5 MCG/ACT inhaler Inhale 2 puffs into the lungs 2 (two) times daily. Patient taking differently: Inhale 2 puffs into the lungs 2 (two) times daily as needed (shortness of breath). 09/05/19  Yes [place], Betty G, MD  cyanocobalamin (,VITAMIN B-12,) 1000 MCG/ML injection Inject 1,000 mcg into the muscle every 30 (thirty) days.    [provider]  desmopressin (DDAVP) 0.1 MG tablet Take 0.3 mg by mouth at bedtime. Patient not taking: No sig reported    [provider]  ondansetron (ZOFRAN) 4 MG tablet Take 1 tablet (4 mg total) by mouth every 8 (eight) hours as needed for nausea or vomiting. Patient not taking: No sig reported 02/25/19   Elodia Florence., MD  pantoprazole (PROTONIX) 40 MG tablet TAKE 1 TABLET DAILY Patient taking differently: Take 40 mg by mouth 2 (two) times daily. 10/15/19   [place], Betty G, MD  predniSONE (DELTASONE) 10 MG tablet Take 10 mg by mouth every morning.    [provider]  progesterone (PROMETRIUM) 100 MG capsule Take 100 mg by mouth at bedtime.    [provider]  psyllium (HYDROCIL/METAMUCIL) 95 % PACK Take 1 packet by mouth 2 (two) times daily. Patient not taking: No sig reported 05/28/20   Donne Hazel, MD  pyridostigmine (MESTINON) 60 MG tablet Take 30 mg by mouth daily. 12/27/17   [provider]  Vitamin D, Ergocalciferol, (DRISDOL) 1.25 MG (50000 UNIT) CAPS capsule 1 CAPSULE EVERY 2 WEEKS Patient not taking: No sig reported 08/08/19   [place], Betty G, MD    Allergies    Duloxetine, Latex, Lorazepam, Orange concentrate [flavoring agent], Other, Percocet  [oxycodone-acetaminophen], Sulfa antibiotics, Bioflavonoids, Cefixime, and Nucynta [tapentadol]  Review of Systems   Review of Systems  Constitutional: Positive for activity change, appetite change and fatigue.  HENT: Negative.   Respiratory: Negative.   Cardiovascular: Negative.   Gastrointestinal: Positive for abdominal pain (Generalized), diarrhea, nausea and vomiting. Negative for abdominal distention, anal bleeding, blood  in stool, constipation and rectal pain.  Genitourinary: Negative.   Musculoskeletal: Positive for back pain (Chronic, recent surgery).  Skin: Positive for wound.  Neurological: Positive for syncope, weakness (Generalized) and light-headedness. Negative for dizziness, tremors, seizures, facial asymmetry, speech difficulty, numbness and headaches.  All other systems reviewed and are negative.   Physical Exam Updated Vital Signs BP (!) 164/89   Pulse 96   Temp 98.1 F (36.7 C)   Resp 17   Ht 5' (1.524 m)   Wt 67 kg   SpO2 100%   BMI 28.85 kg/m   Physical Exam Vitals and nursing note reviewed.  Constitutional:      General: She is not in acute distress.    Appearance: She is well-developed. She is not ill-appearing, toxic-appearing or diaphoretic.  HENT:     Head: Normocephalic.     Comments: Minimal hematoma to right forehead.  No open skin.    Nose: Nose normal.     Mouth/Throat:     Mouth: Mucous membranes are moist.     Comments: Posterior oropharynx clear.  Dentition intact.  No loose teeth Eyes:     Pupils: Pupils are equal, round, and reactive to light.  Neck:     Comments: C-collar present.  Mild midline cervical tenderness Cardiovascular:     Rate and Rhythm: Normal rate.     Pulses: Normal pulses.     Heart sounds: Normal heart sounds.  Pulmonary:     Effort: Pulmonary effort is normal. No respiratory distress.     Breath sounds: Normal breath sounds.     Comments: Speaks in full sentences without difficulty.  Clear to  auscultation Abdominal:     General: Bowel sounds are normal. There is no distension.     Palpations: Abdomen is soft.     Tenderness: There is abdominal tenderness. There is no guarding or rebound.     Hernia: No hernia is present.     Comments: Diffuse tenderness.  No focal pain.  Hyperactive bowel sounds.  No rebound or guarding  Musculoskeletal:        General: Normal range of motion.     Cervical back: Normal range of motion.     Comments: Mild tenderness to right hip.  No shortening or rotation of legs.  Some mild tenderness to lumbar spine.  Surgical incision intact.  No bony tenderness bilateral upper extremities.  Skin:    General: Skin is warm and dry.     Capillary Refill: Capillary refill takes less than 2 seconds.     Comments: Minimal hematoma to right forehead.  No open wounds or breaks in skin.  No lacerations to suture  Neurological:     General: No focal deficit present.     Mental Status: She is alert and oriented to person, place, and time.     Comments: Cranial nerves II to XII grossly intact     ED Results / Procedures / Treatments   Labs (all labs ordered are listed, but only abnormal results are displayed) Labs Reviewed  CBC WITH DIFFERENTIAL/PLATELET - Abnormal; Notable for the following components:      Result Value   WBC 15.7 (*)    RBC 3.28 (*)    Hemoglobin 10.4 (*)    HCT 32.2 (*)    All other components within normal limits  COMPREHENSIVE METABOLIC PANEL  LIPASE, BLOOD  URINALYSIS, ROUTINE W REFLEX MICROSCOPIC    EKG None  Radiology DG Chest 2 View  Result Date: 06/02/2020  CLINICAL DATA:  Fall in the shower.  Chest pain. EXAM: CHEST - 2 VIEW COMPARISON:  02/21/2019 FINDINGS: Post median sternotomy. Lower most sternal wire is broken, unchanged. The heart is normal in size. No pneumothorax or pleural effusion. No focal airspace disease. Spinal stimulator is in place. No acute osseous abnormalities are seen. IMPRESSION: No acute findings or  traumatic injury to the thorax. Electronically Signed   By: Keith Rake M.D.   On: 06/02/2020 19:53   DG Hip Unilat W or Wo Pelvis 2-3 Views Right  Result Date: 06/02/2020 CLINICAL DATA:  Fall in the shower with right hip pain. EXAM: DG HIP (WITH OR WITHOUT PELVIS) 2-3V RIGHT COMPARISON:  Radiograph 07/14/2019 FINDINGS: The cortical margins of the bony pelvis and right hip are intact. No fracture. Pubic symphysis and sacroiliac joints are congruent. The pubic rami are intact. The femoral head is well seated in the acetabulum. Joint space is preserved. Surgical hardware in the lower lumbar spine partially included. Bone island in the right pelvis. IMPRESSION: No fracture of the pelvis or right hip. Electronically Signed   By: Keith Rake M.D.   On: 06/02/2020 19:55    Procedures Procedures   Medications Ordered in ED Medications  promethazine (PHENERGAN) 12.5 mg in sodium chloride 0.9 % 50 mL IVPB (12.5 mg Intravenous New Bag/Given 06/02/20 2040)  sodium chloride 0.9 % bolus 1,000 mL (1,000 mLs Intravenous New Bag/Given 06/02/20 2038)    ED Course  I have reviewed the triage vital signs and the nursing notes.  Pertinent labs & imaging results that were available during my care of the patient were reviewed by me and considered in my medical decision making (see chart for details).  Patient here for evaluation of possible syncope.  Unfortunately has been struggling with nausea, vomiting and diarrhea since she had surgery 1 month ago.  Recently discharged 5 days ago for acute renal failure.  Still having diarrhea.  Prior imaging reviewed does not show any evidence of acute intra-abdominal process.  She is now having generalized abdominal pain.  Persistent diarrhea that melena bright blood per rectum.  Taking Phenergan for her nausea which is help with the vomiting herself persistently nauseous.  Has a nonfocal neuro exam without deficits.  Some mild tenderness to her right hip however no  shortening or rotation of legs.  She is able to flex and extend at all extremities without difficulty.  We will plan on labs, imaging and reassess.  Labs and imaging personally reviewed and interpreted: DG right hip wo fracture, dislocation DG chest without acute abnormality   CBC with leukocytosis at 15.7  Labs 3 days ago from PCP reviewed. Creatinine improved to 1.39 from dc    CMP, Lipase, UA, CT head, cervical, L spine, AP, EKG pending at care transfer  Care transferred to [facility], [place] will will follow up on imaging and determine dispo.    MDM Rules/Calculators/A&P                           Final Clinical Impression(s) / ED Diagnoses Final diagnoses:  Fall  Syncope, unspecified syncope type    Rx / DC Orders ED Discharge Orders    None       ,  A, PA-C 06/02/20 2044    Fredia Sorrow, MD 06/09/20 [phone]

## 2020-06-03 ENCOUNTER — Other Ambulatory Visit: Payer: Self-pay

## 2020-06-03 ENCOUNTER — Encounter (HOSPITAL_COMMUNITY): Payer: Self-pay | Admitting: *Deleted

## 2020-06-03 ENCOUNTER — Emergency Department (HOSPITAL_COMMUNITY): Payer: Medicare HMO

## 2020-06-03 ENCOUNTER — Inpatient Hospital Stay (HOSPITAL_COMMUNITY)
Admission: EM | Admit: 2020-06-03 | Discharge: 2020-06-07 | DRG: 392 | Disposition: A | Discharge status: Home / Self Care | Payer: Medicare HMO | Attending: Student | Admitting: Student

## 2020-06-03 DIAGNOSIS — R739 Hyperglycemia, unspecified: Secondary | ICD-10-CM | POA: Diagnosis present

## 2020-06-03 DIAGNOSIS — Z881 Allergy status to other antibiotic agents status: Secondary | ICD-10-CM

## 2020-06-03 DIAGNOSIS — R6510 Systemic inflammatory response syndrome (SIRS) of non-infectious origin without acute organ dysfunction: Secondary | ICD-10-CM | POA: Diagnosis present

## 2020-06-03 DIAGNOSIS — E860 Dehydration: Secondary | ICD-10-CM | POA: Diagnosis present

## 2020-06-03 DIAGNOSIS — Z8349 Family history of other endocrine, nutritional and metabolic diseases: Secondary | ICD-10-CM

## 2020-06-03 DIAGNOSIS — E349 Endocrine disorder, unspecified: Secondary | ICD-10-CM | POA: Diagnosis not present

## 2020-06-03 DIAGNOSIS — Z20822 Contact with and (suspected) exposure to covid-19: Secondary | ICD-10-CM | POA: Diagnosis present

## 2020-06-03 DIAGNOSIS — K219 Gastro-esophageal reflux disease without esophagitis: Secondary | ICD-10-CM | POA: Diagnosis present

## 2020-06-03 DIAGNOSIS — Z888 Allergy status to other drugs, medicaments and biological substances status: Secondary | ICD-10-CM

## 2020-06-03 DIAGNOSIS — Z85820 Personal history of malignant melanoma of skin: Secondary | ICD-10-CM

## 2020-06-03 DIAGNOSIS — Z8249 Family history of ischemic heart disease and other diseases of the circulatory system: Secondary | ICD-10-CM | POA: Diagnosis not present

## 2020-06-03 DIAGNOSIS — E876 Hypokalemia: Secondary | ICD-10-CM | POA: Diagnosis present

## 2020-06-03 DIAGNOSIS — Z882 Allergy status to sulfonamides status: Secondary | ICD-10-CM

## 2020-06-03 DIAGNOSIS — E872 Acidosis: Secondary | ICD-10-CM | POA: Diagnosis present

## 2020-06-03 DIAGNOSIS — I129 Hypertensive chronic kidney disease with stage 1 through stage 4 chronic kidney disease, or unspecified chronic kidney disease: Secondary | ICD-10-CM | POA: Diagnosis present

## 2020-06-03 DIAGNOSIS — Z825 Family history of asthma and other chronic lower respiratory diseases: Secondary | ICD-10-CM | POA: Diagnosis not present

## 2020-06-03 DIAGNOSIS — I10 Essential (primary) hypertension: Secondary | ICD-10-CM | POA: Diagnosis not present

## 2020-06-03 DIAGNOSIS — Z833 Family history of diabetes mellitus: Secondary | ICD-10-CM

## 2020-06-03 DIAGNOSIS — D631 Anemia in chronic kidney disease: Secondary | ICD-10-CM | POA: Diagnosis present

## 2020-06-03 DIAGNOSIS — Z9049 Acquired absence of other specified parts of digestive tract: Secondary | ICD-10-CM

## 2020-06-03 DIAGNOSIS — K50011 Crohn's disease of small intestine with rectal bleeding: Secondary | ICD-10-CM | POA: Diagnosis not present

## 2020-06-03 DIAGNOSIS — Z981 Arthrodesis status: Secondary | ICD-10-CM

## 2020-06-03 DIAGNOSIS — K5000 Crohn's disease of small intestine without complications: Secondary | ICD-10-CM | POA: Diagnosis not present

## 2020-06-03 DIAGNOSIS — A09 Infectious gastroenteritis and colitis, unspecified: Principal | ICD-10-CM | POA: Diagnosis present

## 2020-06-03 DIAGNOSIS — K5090 Crohn's disease, unspecified, without complications: Secondary | ICD-10-CM | POA: Diagnosis present

## 2020-06-03 DIAGNOSIS — Z83438 Family history of other disorder of lipoprotein metabolism and other lipidemia: Secondary | ICD-10-CM

## 2020-06-03 DIAGNOSIS — E785 Hyperlipidemia, unspecified: Secondary | ICD-10-CM | POA: Diagnosis present

## 2020-06-03 DIAGNOSIS — K591 Functional diarrhea: Secondary | ICD-10-CM | POA: Diagnosis not present

## 2020-06-03 DIAGNOSIS — R197 Diarrhea, unspecified: Secondary | ICD-10-CM | POA: Diagnosis present

## 2020-06-03 DIAGNOSIS — E663 Overweight: Secondary | ICD-10-CM | POA: Diagnosis present

## 2020-06-03 DIAGNOSIS — Z6828 Body mass index (BMI) 28.0-28.9, adult: Secondary | ICD-10-CM

## 2020-06-03 DIAGNOSIS — G7000 Myasthenia gravis without (acute) exacerbation: Secondary | ICD-10-CM | POA: Diagnosis present

## 2020-06-03 DIAGNOSIS — K909 Intestinal malabsorption, unspecified: Secondary | ICD-10-CM | POA: Diagnosis not present

## 2020-06-03 DIAGNOSIS — R5381 Other malaise: Secondary | ICD-10-CM | POA: Diagnosis not present

## 2020-06-03 DIAGNOSIS — R531 Weakness: Secondary | ICD-10-CM | POA: Diagnosis not present

## 2020-06-03 DIAGNOSIS — Z885 Allergy status to narcotic agent status: Secondary | ICD-10-CM | POA: Diagnosis not present

## 2020-06-03 DIAGNOSIS — G8929 Other chronic pain: Secondary | ICD-10-CM | POA: Diagnosis present

## 2020-06-03 DIAGNOSIS — Z79899 Other long term (current) drug therapy: Secondary | ICD-10-CM

## 2020-06-03 DIAGNOSIS — R112 Nausea with vomiting, unspecified: Secondary | ICD-10-CM | POA: Diagnosis not present

## 2020-06-03 DIAGNOSIS — M797 Fibromyalgia: Secondary | ICD-10-CM | POA: Diagnosis present

## 2020-06-03 DIAGNOSIS — R509 Fever, unspecified: Secondary | ICD-10-CM | POA: Diagnosis present

## 2020-06-03 DIAGNOSIS — Z7952 Long term (current) use of systemic steroids: Secondary | ICD-10-CM

## 2020-06-03 DIAGNOSIS — Z7951 Long term (current) use of inhaled steroids: Secondary | ICD-10-CM

## 2020-06-03 DIAGNOSIS — Z91040 Latex allergy status: Secondary | ICD-10-CM

## 2020-06-03 DIAGNOSIS — N1831 Chronic kidney disease, stage 3a: Secondary | ICD-10-CM | POA: Diagnosis present

## 2020-06-03 DIAGNOSIS — R110 Nausea: Secondary | ICD-10-CM | POA: Diagnosis not present

## 2020-06-03 DIAGNOSIS — Z9889 Other specified postprocedural states: Secondary | ICD-10-CM | POA: Diagnosis not present

## 2020-06-03 DIAGNOSIS — R809 Proteinuria, unspecified: Secondary | ICD-10-CM | POA: Diagnosis not present

## 2020-06-03 DIAGNOSIS — H548 Legal blindness, as defined in USA: Secondary | ICD-10-CM | POA: Diagnosis present

## 2020-06-03 DIAGNOSIS — A419 Sepsis, unspecified organism: Secondary | ICD-10-CM | POA: Diagnosis present

## 2020-06-03 LAB — URINALYSIS, ROUTINE W REFLEX MICROSCOPIC
Bilirubin Urine: NEGATIVE
Glucose, UA: NEGATIVE mg/dL
Hgb urine dipstick: NEGATIVE
Ketones, ur: 5 mg/dL — AB
Leukocytes,Ua: NEGATIVE
Nitrite: NEGATIVE
Protein, ur: >=300 mg/dL — AB
Specific Gravity, Urine: 1.033 — ABNORMAL HIGH (ref 1.005–1.030)
pH: 5 (ref 5.0–8.0)

## 2020-06-03 LAB — CBC
HCT: 31 % — ABNORMAL LOW (ref 36.0–46.0)
Hemoglobin: 10.5 g/dL — ABNORMAL LOW (ref 12.0–15.0)
MCH: 31.9 pg (ref 26.0–34.0)
MCHC: 33.9 g/dL (ref 30.0–36.0)
MCV: 94.2 fL (ref 80.0–100.0)
Platelets: 320 10*3/uL (ref 150–400)
RBC: 3.29 MIL/uL — ABNORMAL LOW (ref 3.87–5.11)
RDW: 14.5 % (ref 11.5–15.5)
WBC: 16.9 10*3/uL — ABNORMAL HIGH (ref 4.0–10.5)
nRBC: 0 % (ref 0.0–0.2)

## 2020-06-03 LAB — RESP PANEL BY RT-PCR (FLU A&B, COVID) ARPGX2
Influenza A by PCR: NEGATIVE
Influenza B by PCR: NEGATIVE
SARS Coronavirus 2 by RT PCR: NEGATIVE

## 2020-06-03 LAB — COMPREHENSIVE METABOLIC PANEL
ALT: 28 U/L (ref 0–44)
AST: 28 U/L (ref 15–41)
Albumin: 4 g/dL (ref 3.5–5.0)
Alkaline Phosphatase: 96 U/L (ref 38–126)
Anion gap: 15 (ref 5–15)
BUN: 12 mg/dL (ref 6–20)
CO2: 18 mmol/L — ABNORMAL LOW (ref 22–32)
Calcium: 8.1 mg/dL — ABNORMAL LOW (ref 8.9–10.3)
Chloride: 106 mmol/L (ref 98–111)
Creatinine, Ser: 1.13 mg/dL — ABNORMAL HIGH (ref 0.44–1.00)
GFR, Estimated: 56 mL/min — ABNORMAL LOW (ref >60)
Glucose, Bld: 115 mg/dL — ABNORMAL HIGH (ref 70–99)
Potassium: 2.9 mmol/L — ABNORMAL LOW (ref 3.5–5.1)
Sodium: 139 mmol/L (ref 135–145)
Total Bilirubin: 0.8 mg/dL (ref 0.3–1.2)
Total Protein: 7.3 g/dL (ref 6.5–8.1)

## 2020-06-03 LAB — I-STAT BETA HCG BLOOD, ED (MC, WL, AP ONLY): I-stat hCG, quantitative: 7.6 m[IU]/mL — ABNORMAL HIGH (ref <5)

## 2020-06-03 LAB — MAGNESIUM: Magnesium: 1.2 mg/dL — ABNORMAL LOW (ref 1.7–2.4)

## 2020-06-03 LAB — LACTIC ACID, PLASMA: Lactic Acid, Venous: 2.3 mmol/L (ref 0.5–1.9)

## 2020-06-03 LAB — PROTIME-INR
INR: 1.1 (ref 0.8–1.2)
Prothrombin Time: 13.4 seconds (ref 11.4–15.2)

## 2020-06-03 LAB — LIPASE, BLOOD: Lipase: 36 U/L (ref 11–51)

## 2020-06-03 LAB — APTT: aPTT: 31 seconds (ref 24–36)

## 2020-06-03 MED ORDER — LACTATED RINGERS IV BOLUS (SEPSIS)
1000.0000 mL | Freq: Once | INTRAVENOUS | Status: AC
  Administered 2020-06-03: 1000 mL via INTRAVENOUS

## 2020-06-03 MED ORDER — POTASSIUM CHLORIDE 10 MEQ/100ML IV SOLN
10.0000 meq | INTRAVENOUS | Status: AC
  Administered 2020-06-03 (×2): 10 meq via INTRAVENOUS
  Filled 2020-06-03 (×2): qty 100

## 2020-06-03 MED ORDER — SODIUM CHLORIDE 0.9 % IV SOLN
25.0000 mg | Freq: Once | INTRAVENOUS | Status: AC
  Administered 2020-06-03: 25 mg via INTRAVENOUS
  Filled 2020-06-03: qty 1

## 2020-06-03 MED ORDER — LACTATED RINGERS IV SOLN: INTRAVENOUS | Status: AC

## 2020-06-03 MED ORDER — PIPERACILLIN-TAZOBACTAM 3.375 G IVPB 30 MIN
3.3750 g | Freq: Once | INTRAVENOUS | Status: AC
  Administered 2020-06-03: 3.375 g via INTRAVENOUS
  Filled 2020-06-03: qty 50

## 2020-06-03 MED ORDER — LACTATED RINGERS IV BOLUS (SEPSIS)
250.0000 mL | Freq: Once | INTRAVENOUS | Status: AC
  Administered 2020-06-03: 250 mL via INTRAVENOUS

## 2020-06-03 MED ORDER — MAGNESIUM SULFATE 2 GM/50ML IV SOLN
2.0000 g | Freq: Once | INTRAVENOUS | Status: AC
  Administered 2020-06-03: 2 g via INTRAVENOUS
  Filled 2020-06-03: qty 50

## 2020-06-03 MED ORDER — HYDROCODONE-ACETAMINOPHEN 5-325 MG PO TABS
2.0000 | ORAL_TABLET | Freq: Four times a day (QID) | ORAL | Status: DC | PRN
  Administered 2020-06-03 – 2020-06-06 (×7): 2 via ORAL
  Filled 2020-06-03 (×7): qty 2

## 2020-06-03 NOTE — ED Provider Notes (Cosign Needed Addendum)
[facility] DEPT Provider Note   CSN: [id number] Arrival date & time: 06/03/20  1235     History Chief Complaint  Patient presents with  . Diarrhea    Teresa Lee is a 60 y.o. female with a past medical history significant for Crohn's, GERD, myasthenia gravis, legal blindness in left eye, recent lumbar fusion and microdiscectomy on 05/04/2020 by Dr. [name] who presents to the ED from [facility] due to persistent diarrhea x 3 weeks.  Patient admits to roughly 5-10 episodes of nonbloody diarrhea daily for the past 3 weeks associated with nausea and decreased oral intake.  Patient notes diarrhea started after her lumbar fusion when she developed opioid-induced constipation and took MiraLAX and prune juice causing persistent diarrhea.  Diarrhea associated with generalized abdominal pain.  Denies emesis however, admits to dry heaving.  Patient has been taking Imodium, phenergan, and potassium supplements.  Denies melena, hematochezia, and hematemesis.  Patient has a history of a cholecystectomy; however, no other abdominal operations. Denies urinary and vaginal symptoms. Denies fever, but admits to chills.   Chart reviewed.  Patient was recently admitted to the hospital on 3/5-3/01/2021 due to nausea, vomiting, and diarrhea.  EGD and colonoscopy performed on 05/24/2020 which was nonrevealing.  C. difficile PCR and GI panel negative.  GI at that time recommended Imodium as needed.  Patient was also seen in the ED yesterday after a possible syncopal episode with negative images.  Patient was evaluated by [facility] today and sent to the ED for admission. Diarrhea thought to be related to possibly polypharmacy; however notes that CTA abdomen may be needed for further evaluation given patient is on estrogen and had a recent surgery.    Past Medical History:  Diagnosis Date  . Arthritis    osteoarthritis  . Asthma   . Blind left eye   . Cancer ([place])    skin  . Crohn's  disease ([place])   . CTS (carpal tunnel syndrome)   . DJD (degenerative joint disease)    Both cervical spine and LS spine  . Fibromyalgia   . GERD (gastroesophageal reflux disease)   . Heart murmur   . Herniated nucleus pulposus   . Hyperlipidemia   . Hypertension   . IBS (irritable bowel syndrome)   . Leukocytosis   . Myasthenia gravis   . Myasthenia gravis ([place])   . Myasthenia gravis ([place])   . Pneumonia   . Spinal cord stimulator status     Patient Active Problem List   Diagnosis Date Noted  . ARF (acute renal failure) ([place]) 05/22/2020  . Diarrhea 05/22/2020  . Nausea 05/22/2020  . Spondylolisthesis of lumbar region 05/04/2020  . Chronic pain disorder 08/12/2019  . COVID-19   . Acute on chronic renal failure ([place]) 02/16/2019  . Insomnia disorder 02/27/2018  . Fatigue 02/27/2018  . Fibromyalgia syndrome 04/30/2017  . RLS (restless legs syndrome) 12/26/2016  . B12 deficiency 12/26/2016  . Vitamin D deficiency, unspecified 12/26/2016  . GERD (gastroesophageal reflux disease) 12/26/2016  . Anxiety disorder 12/26/2016  . Hyponatremia 10/24/2016  . AKI (acute kidney injury) ([place]) 10/23/2016  . Hypomagnesemia 10/22/2016  . Hypophosphatemia 10/22/2016  . Hypokalemia 10/21/2016  . Wound infection after surgery 10/21/2016  . Sepsis ([place]) 10/21/2016  . Myasthenia gravis ([place]) 10/21/2016  . Melanoma ([place]) 10/21/2016  . Crohn disease ([place]) 10/21/2016  . Cellulitis 10/21/2016  . Palpitations 08/25/2013  . Leukocytosis 05/25/2011  . Hyperlipidemia 04/20/2007  . Essential hypertension 04/20/2007  .  Asthma 04/20/2007  . CARPAL TUNNEL SYNDROME, HX OF 04/20/2007    Past Surgical History:  Procedure Laterality Date  . BACK SURGERY    . BIOPSY  05/24/2020   Procedure: BIOPSY;  Surgeon: Ronald Lobo, MD;  Location: [facility];  Service: Endoscopy;;  . BREAST REDUCTION SURGERY    . CARPAL TUNNEL RELEASE     bilateral  . cervical disc inj    . CHOLECYSTECTOMY    .  COLONOSCOPY WITH PROPOFOL N/A 05/24/2020   Procedure: COLONOSCOPY WITH PROPOFOL;  Surgeon: Ronald Lobo, MD;  Location: [facility];  Service: Endoscopy;  Laterality: N/A;  . ENDOMETRIAL ABLATION W/ NOVASURE    . ESOPHAGOGASTRODUODENOSCOPY (EGD) WITH PROPOFOL N/A 05/24/2020   Procedure: ESOPHAGOGASTRODUODENOSCOPY (EGD) WITH PROPOFOL;  Surgeon: Ronald Lobo, MD;  Location: [facility];  Service: Endoscopy;  Laterality: N/A;  . LUMBAR LAMINECTOMY/DECOMPRESSION MICRODISCECTOMY Right 05/04/2020   Procedure: Right Lumbar Five Sacral One Microdiscectomy;  Surgeon: Erline Levine, MD;  Location: [facility];  Service: Neurosurgery;  Laterality: Right;  posterior  . MELANOMA EXCISION    . NASAL SINUS SURGERY    . SPINAL CORD STIMULATOR INSERTION  2019  . THYMECTOMY     due to myastenia gravis  . THYMECTOMY    . TRANSFORAMINAL LUMBAR INTERBODY FUSION (TLIF) WITH PEDICLE SCREW FIXATION 1 LEVEL Right 05/04/2020   Procedure: Right Lumbar Four-Five Transforaminal lumbar interbody fusion;  Surgeon: Erline Levine, MD;  Location: [facility];  Service: Neurosurgery;  Laterality: Right;  posterior     OB History   No obstetric history on file.     Family History  Problem Relation Age of Onset  . Asthma Daughter   . Heart disease Mother   . Cancer Mother   . Hyperlipidemia Mother   . Hypertension Mother   . Diabetes Father     Social History   Tobacco Use  . Smoking status: Never Smoker  . Smokeless tobacco: Never Used  Vaping Use  . Vaping Use: Never used  Substance Use Topics  . Alcohol use: Yes    Comment: occassional wine  . Drug use: No    Home Medications Prior to Admission medications   Medication Sig Start Date End Date Taking? Authorizing Provider  albuterol (PROVENTIL) (2.5 MG/3ML) 0.083% nebulizer solution Take 2.5 mg by nebulization every 6 (six) hours as needed for shortness of breath. 12/26/19   [provider]  albuterol (VENTOLIN HFA) 108 (90 Base) MCG/ACT inhaler 1 PUFF  EVERY 6 HOURS AS NEEDED FOR WHEEZING Patient taking differently: Inhale 1 puff into the lungs every 6 (six) hours as needed for wheezing or shortness of breath. 09/05/19   [place], Betty G, MD  ALPRAZolam Duanne Moron) 0.5 MG tablet Take 0.5 mg by mouth at bedtime.    [provider]  Calcium Carbonate (CALCIUM 500 PO) Take 1 tablet by mouth daily.    [provider]  cyanocobalamin (,VITAMIN B-12,) 1000 MCG/ML injection Inject 1,000 mcg into the muscle every 30 (thirty) days.    [provider]  desmopressin (DDAVP) 0.1 MG tablet Take 0.3 mg by mouth at bedtime. Patient not taking: No sig reported    [provider]  estradiol (ESTRACE) 2 MG tablet Take 2 mg by mouth at bedtime.    [provider]  HYDROcodone-acetaminophen (NORCO) 10-325 MG tablet Take 1 tablet by mouth 5 (five) times daily as needed for moderate pain. 08/25/17   [provider]  loperamide (IMODIUM) 2 MG capsule Take 2 capsules (4 mg total) by  mouth as needed for diarrhea or loose stools. 05/28/20   Donne Hazel, MD  magnesium oxide (MAG-OX) 400 MG tablet Take 400 mg by mouth daily.    [provider]  Mesalamine (ASACOL) 400 MG CPDR DR capsule Take 400 mg by mouth 2 (two) times daily. 12/10/18   [provider]  methocarbamol (ROBAXIN) 750 MG tablet Take 750 mg by mouth at bedtime. 02/07/19   [provider]  mupirocin ointment (BACTROBAN) 2 % Apply 1 application topically 2 (two) times daily. 05/31/20 05/31/21  [provider]  mycophenolate (CELLCEPT) 500 MG tablet Take 1,000 mg by mouth 2 (two) times daily.     [provider]  naloxone [facility]) nasal spray 4 mg/0.1 mL Place 1 spray into the nose once as needed (opioid overdose).    [provider]  ondansetron (ZOFRAN) 4 MG tablet Take 1 tablet (4 mg total) by mouth every 8 (eight) hours as needed for nausea or vomiting. Patient not taking: No sig reported 02/25/19   Elodia Florence., MD  pantoprazole (PROTONIX) 40 MG tablet TAKE 1 TABLET DAILY Patient taking differently: Take 40 mg by mouth 2 (two) times daily. 10/15/19   [place], Betty G, MD  potassium chloride 20 MEQ TBCR Take 20 mEq by mouth daily for 5 days. 06/02/20 06/07/20  Marcello Fennel, PA-C  predniSONE (DELTASONE) 10 MG tablet Take 10 mg by mouth every morning.    [provider]  progesterone (PROMETRIUM) 100 MG capsule Take 100 mg by mouth at bedtime.    [provider]  promethazine (PHENERGAN) 25 MG tablet Take 25 mg by mouth every 6 (six) hours as needed. 05/31/20   [provider]  PROMETHEGAN 25 MG suppository Place 25 mg rectally every 6 (six) hours as needed. 05/31/20   [provider]  psyllium (HYDROCIL/METAMUCIL) 95 % PACK Take 1 packet by mouth 2 (two) times daily. Patient not taking: No sig reported 05/28/20   Donne Hazel, MD  pyridostigmine (MESTINON) 60 MG tablet Take 30 mg by mouth daily. 12/27/17   [provider]  rOPINIRole (REQUIP) 1 MG tablet Take 1-2 tablets (1-2 mg total) by mouth at bedtime. Patient taking differently: Take 1 mg by mouth See admin instructions. Take 1 tablet (1 mg) by mouth daily at bedtime, may take a 2nd tablet (1 mg) later if still needed for restless legs 08/08/19   [place], Betty G, MD  rosuvastatin (CRESTOR) 40 MG tablet Take 1 tablet (40 mg total) by mouth daily. 02/02/19   [place], Betty G, MD  Spacer/Aero-Holding Chambers (AEROCHAMBER PLUS) inhaler Use as instructed to use with inahaler. 08/30/18   [place], Betty G, MD  SYMBICORT 80-4.5 MCG/ACT inhaler Inhale 2 puffs into the lungs 2 (two) times daily. Patient taking differently: Inhale 2 puffs into the lungs 2 (two) times daily as needed (shortness of breath). 09/05/19   [place], Betty G, MD  Vitamin D, Ergocalciferol, (DRISDOL) 1.25 MG (50000 UNIT) CAPS capsule 1 CAPSULE EVERY 2 WEEKS Patient not taking: No sig reported 08/08/19   [place], Betty G, MD     Allergies    Duloxetine, Latex, Lorazepam, Orange concentrate [flavoring agent], Other, Percocet [oxycodone-acetaminophen], Sulfa antibiotics, Bioflavonoids, Cefixime, and Nucynta [tapentadol]  Review of Systems   Review of Systems  Constitutional: Positive for chills. Negative for fever.  Respiratory: Negative for shortness of breath.   Cardiovascular: Negative for chest pain.  Gastrointestinal: Positive for abdominal pain, diarrhea and nausea. Negative for anal bleeding, blood  in stool and vomiting.  Genitourinary: Negative for dysuria and vaginal discharge.  Musculoskeletal: Positive for back pain.  Neurological: Positive for dizziness.  All other systems reviewed and are negative.   Physical Exam Updated Vital Signs BP (!) 144/91 (BP Location: Right Arm)   Pulse (!) 114   Temp 99 F (37.2 C) (Oral)   Resp 16   SpO2 100%   Physical Exam Vitals and nursing note reviewed.  Constitutional:      General: She is not in acute distress.    Appearance: She is ill-appearing.     Comments: Pale, appears uncomfortable in bed  HENT:     Head: Normocephalic.     Mouth/Throat:     Comments: Dry mucus membranes Eyes:     Pupils: Pupils are equal, round, and reactive to light.  Cardiovascular:     Rate and Rhythm: Regular rhythm. Tachycardia present.     Pulses: Normal pulses.     Heart sounds: Normal heart sounds. No murmur heard. No friction rub. No gallop.   Pulmonary:     Effort: Pulmonary effort is normal.     Breath sounds: Normal breath sounds.  Abdominal:     General: Abdomen is flat. Bowel sounds are normal. There is no distension.     Palpations: Abdomen is soft.     Tenderness: There is abdominal tenderness. There is no guarding or rebound.     Comments: Diffuse abdominal tenderness. No rebound or guarding.   Musculoskeletal:        General: Normal range of motion.     Cervical back: Neck supple.  Skin:    Coloration: Skin is pale.     Comments: Well healing  surgical scars in lumbar region. No drainage.   Neurological:     General: No focal deficit present.     Mental Status: She is alert.  Psychiatric:        Mood and Affect: Mood normal.        Behavior: Behavior normal.     ED Results / Procedures / Treatments   Labs (all labs ordered are listed, but only abnormal results are displayed) Labs Reviewed  COMPREHENSIVE METABOLIC PANEL - Abnormal; Notable for the following components:      Result Value   Potassium 2.9 (*)    CO2 18 (*)    Glucose, Bld 115 (*)    Creatinine, Ser 1.13 (*)    Calcium 8.1 (*)    GFR, Estimated 56 (*)    All other components within normal limits  CBC - Abnormal; Notable for the following components:   WBC 16.9 (*)    RBC 3.29 (*)    Hemoglobin 10.5 (*)    HCT 31.0 (*)    All other components within normal limits  I-STAT BETA HCG BLOOD, ED ([facility], [facility], [facility] ONLY) - Abnormal; Notable for the following components:   I-stat hCG, quantitative 7.6 (*)    All other components within normal limits  LIPASE, BLOOD  URINALYSIS, ROUTINE W REFLEX MICROSCOPIC    EKG None  Radiology DG Chest 2 View  Result Date: 06/02/2020 CLINICAL DATA:  Fall in the shower.  Chest pain. EXAM: CHEST - 2 VIEW COMPARISON:  02/21/2019 FINDINGS: Post median sternotomy. Lower most sternal wire is broken, unchanged. The heart is normal in size. No pneumothorax or pleural effusion. No focal airspace disease. Spinal stimulator is in place. No acute osseous abnormalities are seen. IMPRESSION: No acute findings or traumatic injury to the thorax. Electronically Signed  By: Keith Rake M.D.   On: 06/02/2020 19:53   CT Head Wo Contrast  Result Date: 06/02/2020 CLINICAL DATA:  [place] and hit head abrasion and bruising EXAM: CT HEAD WITHOUT CONTRAST CT CERVICAL SPINE WITHOUT CONTRAST TECHNIQUE: Multidetector CT imaging of the head and cervical spine was performed following the standard protocol without intravenous contrast. Multiplanar CT image  reconstructions of the cervical spine were also generated. COMPARISON:  CT 09/06/2017 FINDINGS: CT HEAD FINDINGS Brain: No acute territorial infarction, hemorrhage or intracranial mass is visualized. Absence of the septum pellucidum. 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 Stable ventricle size and morphology. Vascular: No hyperdense vessels.  Carotid vascular calcification Skull: Normal. Negative for fracture or focal lesion. Sinuses/Orbits: No acute finding. Other: None CT CERVICAL SPINE FINDINGS Alignment: Reversal of cervical lordosis. 3 mm anterolisthesis C3 on C4. Similar trace retrolisthesis C4 on C5. Facet alignment is maintained Skull base and vertebrae: No acute fracture. No primary bone lesion or focal pathologic process. Soft tissues and spinal canal: No prevertebral fluid or swelling. No visible canal hematoma. Disc levels: Advanced degenerative changes C4-C5, C5-C6 and C6-C7 with disc space narrowing and osteophyte. Facet degenerative changes at multiple levels. Upper chest: Negative. Other: None IMPRESSION: 1. No CT evidence for acute intracranial abnormality. 2. 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 3. Reversal of cervical lordosis with advanced degenerative changes. No fracture seen. Slight increased anterolisthesis C3 on C4 likely due to posterior facet degenerative change which has progressed. Electronically Signed   By: Donavan Foil M.D.   On: 06/02/2020 21:53   CT Cervical Spine Wo Contrast  Result Date: 06/02/2020 CLINICAL DATA:  [place] and hit head abrasion and bruising EXAM: CT HEAD WITHOUT CONTRAST CT CERVICAL SPINE WITHOUT CONTRAST TECHNIQUE: Multidetector CT imaging of the head and cervical spine was performed following the standard protocol without intravenous  contrast. Multiplanar CT image reconstructions of the cervical spine were also generated. COMPARISON:  CT 09/06/2017 FINDINGS: CT HEAD FINDINGS Brain: No acute territorial infarction, hemorrhage or intracranial mass is visualized. Absence of the septum pellucidum. 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 Stable ventricle size and morphology. Vascular: No hyperdense vessels.  Carotid vascular calcification Skull: Normal. Negative for fracture or focal lesion. Sinuses/Orbits: No acute finding. Other: None CT CERVICAL SPINE FINDINGS Alignment: Reversal of cervical lordosis. 3 mm anterolisthesis C3 on C4. Similar trace retrolisthesis C4 on C5. Facet alignment is maintained Skull base and vertebrae: No acute fracture. No primary bone lesion or focal pathologic process. Soft tissues and spinal canal: No prevertebral fluid or swelling. No visible canal hematoma. Disc levels: Advanced degenerative changes C4-C5, C5-C6 and C6-C7 with disc space narrowing and osteophyte. Facet degenerative changes at multiple levels. Upper chest: Negative. Other: None IMPRESSION: 1. No CT evidence for acute intracranial abnormality. 2. 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 3. Reversal of cervical lordosis with advanced degenerative changes. No fracture seen. Slight increased anterolisthesis C3 on C4 likely due to posterior facet degenerative change which has progressed. Electronically Signed   By: Donavan Foil M.D.   On: 06/02/2020 21:53   CT Abdomen Pelvis W Contrast  Result  Date: 06/02/2020 CLINICAL DATA:  Diarrhea with nausea and vomiting. Acute abdominal pain. Fall in the shower. EXAM: CT ABDOMEN AND PELVIS WITH CONTRAST TECHNIQUE: Multidetector CT imaging of the abdomen and pelvis was performed using the  standard protocol following bolus administration of intravenous contrast. CONTRAST:  168m OMNIPAQUE IOHEXOL 300 MG/ML  SOLN COMPARISON:  Abdominal CT 11 days ago 05/22/2020 FINDINGS: Lower chest: Minor hypoventilatory atelectasis in the left greater than right lower lobe. Hepatobiliary: Diffusely decreased hepatic density typical of steatosis. No focal hepatic abnormality. Gallbladder physiologically distended, no calcified stone. No biliary dilatation. Pancreas: No ductal dilatation or inflammation. Spleen: Normal in size without focal abnormality. Small splenule anteriorly near the pancreatic tail. Adrenals/Urinary Tract: Normal adrenal glands. No hydronephrosis or perinephric edema. Homogeneous renal enhancement with symmetric excretion on delayed phase imaging. Minimal cortical scarring in the upper left kidney. Urinary bladder is physiologically distended without wall thickening. Stomach/Bowel: The stomach is unremarkable. Normal positioning of the duodenum and ligament of Treitz. Occasional fluid-filled small bowel that is nonobstructed, inflamed or abnormally dilated. Normal appendix. Small volume of formed stool throughout the colon. No liquid stool. No colonic wall thickening. No pericolonic inflammation. Vascular/Lymphatic: Aortic atherosclerosis. No aneurysm. Patent portal vein. Retroaortic left renal vein. No abdominopelvic adenopathy. Reproductive: 11 mm left ovarian/adnexal cyst needs no further follow-up. Endometrium measures approximately 6 mm. Other: No free fluid or free air. Spinal stimulator with battery pack in the right, partially included. Musculoskeletal: Lumbar spine assessed on concurrent lumbar spine reformats. Occasional bone islands in the pelvis. No acute pelvic fracture. IMPRESSION: 1. No acute abnormality or explanation for abdominal pain and diarrhea. 2. Mild endometrial thickening. Nonemergent pelvic ultrasound is again recommended for further evaluation on an elective basis. 3.  Hepatic steatosis. Aortic Atherosclerosis (ICD10-I70.0). Electronically Signed   By: MKeith RakeM.D.   On: 06/02/2020 21:50   CT L-SPINE NO CHARGE  Result Date: 06/02/2020 CLINICAL DATA:  Initial evaluation for acute trauma, fall, recent surgery. EXAM: CT LUMBAR SPINE WITHOUT CONTRAST TECHNIQUE: Multidetector CT imaging of the lumbar spine was performed without intravenous contrast administration. Multiplanar CT image reconstructions were also generated. COMPARISON:  Prior radiograph from 05/04/2020. FINDINGS: Segmentation: Standard. Lowest well-formed disc space labeled the L5-S1 level. Alignment: Trace 2 mm anterolisthesis of L4 on L5. Alignment otherwise normal preservation of the normal lumbar lordosis. Vertebrae: Vertebral body height maintained without acute or chronic fracture. Visualized sacrum and pelvis intact. SI joints approximated symmetric. No discrete or worrisome osseous lesions. Sequelae of recent PLIF at L4-5. Hardware is intact and well-positioned. No periprosthetic lucency to suggest lucency or failure. Paraspinal and other soft tissues: Normal expected postoperative changes present within the posterior paraspinous soft tissues. Paraspinous soft tissues demonstrate no other acute finding. Generator for spinal stimulator device present within the subcutaneous fat of the right flank. Electrodes seen coursing cephalad within the dorsal epidural space, entering the spinal canal at the level of T11-12. Visualized visceral structures within normal limits. Retro Dick left renal vein noted. Mild aortic atherosclerosis. Disc levels: L1-2:  Unremarkable. L2-3: Small biforaminal disc protrusions, slightly larger on the right. No spinal stenosis. Foramina remain patent. L3-4: Minimal disc bulge, eccentric to the right. Superimposed right foraminal to extraforaminal disc protrusion with annular calcification (series 8, image 100). Mild facet hypertrophy. No significant spinal stenosis. Mild to  moderate right with mild left L3 foraminal stenosis. L4-5: Recent PLIF. No appreciable residual spinal stenosis. Foramina appear grossly patent. L5-S1: Broad-based central to right subarticular disc protrusion with slight caudad angulation. Minimal facet  hypertrophy. No significant spinal stenosis. Foramina remain patent. IMPRESSION: 1. No acute traumatic injury within the lumbar spine. 2. Sequelae of recent PLIF at P3-8 without complication. No significant residual spinal stenosis. Foramina remain patent at this time. 3. Broad-based central to right subarticular disc protrusion at L5-S1, closely approximating the descending right S1 nerve root, similar to previous. 4. Right foraminal to extraforaminal disc protrusion at L3-4, potentially irritating the exiting right L3 nerve root. Electronically Signed   By: Jeannine Boga M.D.   On: 06/02/2020 22:48   DG Hip Unilat W or Wo Pelvis 2-3 Views Right  Result Date: 06/02/2020 CLINICAL DATA:  Fall in the shower with right hip pain. EXAM: DG HIP (WITH OR WITHOUT PELVIS) 2-3V RIGHT COMPARISON:  Radiograph 07/14/2019 FINDINGS: The cortical margins of the bony pelvis and right hip are intact. No fracture. Pubic symphysis and sacroiliac joints are congruent. The pubic rami are intact. The femoral head is well seated in the acetabulum. Joint space is preserved. Surgical hardware in the lower lumbar spine partially included. Bone island in the right pelvis. IMPRESSION: No fracture of the pelvis or right hip. Electronically Signed   By: Keith Rake M.D.   On: 06/02/2020 19:55    Procedures .Critical Care Performed by: Suzy Bouchard, PA-C Authorized by: Suzy Bouchard, PA-C   Critical care provider statement:    Critical care time (minutes):  45   Critical care time was exclusive of:  Separately billable procedures and treating other patients and teaching time   Critical care was necessary to treat or prevent imminent or life-threatening  deterioration of the following conditions:  Sepsis and dehydration   Critical care was time spent personally by me on the following activities:  Discussions with consultants, evaluation of patient's response to treatment, examination of patient, ordering and performing treatments and interventions, ordering and review of laboratory studies, ordering and review of radiographic studies, pulse oximetry, re-evaluation of patient's condition, obtaining history from patient or surrogate and review of old charts   I assumed direction of critical care for this patient from another provider in my specialty: no       Medications Ordered in ED Medications - No data to display  ED Course  I have reviewed the triage vital signs and the nursing notes.  Pertinent labs & imaging results that were available during my care of the patient were reviewed by me and considered in my medical decision making (see chart for details).  Clinical Course as of 06/03/20 1706  Thu Jun 03, 2020  1622 Potassium(!): 2.9 [CA]  1623 Creatinine(!): 1.13 [CA]  1623 WBC(!): 16.9 [CA]  1646 Code sepsis initiated after initial evaluation for presumed intra-abdominal etiology. Spoke to [place]calfe with pharmacy who recommends Zosyn given her allergies to cefixime due to myasthenia gravis. Discussed with Dr. Langston Masker who evaluated patient at bedside and agrees with assessment and plan. IVF and antibiotics started. [SN]  0539 60 yo female presenting to ED with diarrhea, weakness for several day,s found to be febrile with sirs criteria on arrival.  Code sepsis initiated.  WBC 16.9.  K 2.9.  Pt looks tired on exam.  Mild diffuse abd tenderness.  This is likely a GI source.  She's had an extensive w/u with GI as an outpatient.  We'll start BS antibiotics for now, give IV fluids. [MT]  1657 C diff and stool pathogens negative on 05/23/20 [MT]  1659 CT abdomen yesterday with no focal findings to explain symptoms - we'll check for  alternative source [MT]     Clinical Course User Index [CA] Suzy Bouchard, PA-C [MT] Langston Masker, Carola Rhine, MD   MDM Rules/Calculators/A&P                         60 year old female presents to the ED from [facility] due to persistent diarrhea x3 weeks. Patient recently admitted for the same. Patient was also seen yesterday for a syncopal episode with reassuring labs and images, including CT abdomen and lumbar spine. Patient was evaluated by [facility] today who recommended admission due to dehydration and hypokalemia. Upon arrival, patient found to be septic with presumed etiology intra-abdominal. IVFs and antibiotics started. Sepsis labs ordered. Discussed case with Dr. Langston Masker who evaluated patient at bedside and agrees with assessment and plan.   CBC significant for leukocytosis at 16.9 which appears to be uptrending compared to the past few days. Anemia with hemoglobin at 10.5 which appears to be around patient's baseline.  CMP significant for hypokalemia 2.9, hyperglycemia 115 with no anion gap.  Elevated creatinine at 1.13.  Potassium repleted here in the ED.  Lipase normal at 36.  Patient handed off to Dr. Langston Masker at shift change pending labs and admission for sepsis.  Final Clinical Impression(s) / ED Diagnoses Final diagnoses:  None    Rx / DC Orders ED Discharge Orders    None       Suzy Bouchard, PA-C 06/03/20 1754    Suzy Bouchard, PA-C 06/03/20 1755    Wyvonnia Dusky, MD 06/04/20 [phone]

## 2020-06-03 NOTE — Progress Notes (Signed)
Notified bedside nurse of need to draw repeat lactic acid. 

## 2020-06-03 NOTE — Sepsis Progress Note (Signed)
eLink is monitoring this Code Sepsis. °

## 2020-06-03 NOTE — ED Triage Notes (Signed)
Pt seen at [facility] for diarrhea. Paperwork from [facility] recommends admission for poor PO intake and hypokalemia.

## 2020-06-04 ENCOUNTER — Encounter (HOSPITAL_COMMUNITY): Payer: Self-pay | Admitting: Internal Medicine

## 2020-06-04 DIAGNOSIS — I10 Essential (primary) hypertension: Secondary | ICD-10-CM

## 2020-06-04 DIAGNOSIS — E876 Hypokalemia: Secondary | ICD-10-CM

## 2020-06-04 DIAGNOSIS — K591 Functional diarrhea: Secondary | ICD-10-CM

## 2020-06-04 DIAGNOSIS — G7000 Myasthenia gravis without (acute) exacerbation: Secondary | ICD-10-CM

## 2020-06-04 DIAGNOSIS — R6510 Systemic inflammatory response syndrome (SIRS) of non-infectious origin without acute organ dysfunction: Secondary | ICD-10-CM

## 2020-06-04 DIAGNOSIS — K50011 Crohn's disease of small intestine with rectal bleeding: Secondary | ICD-10-CM

## 2020-06-04 DIAGNOSIS — Z9889 Other specified postprocedural states: Secondary | ICD-10-CM

## 2020-06-04 DIAGNOSIS — R197 Diarrhea, unspecified: Secondary | ICD-10-CM

## 2020-06-04 LAB — CBC
HCT: 25 % — ABNORMAL LOW (ref 36.0–46.0)
Hemoglobin: 8.2 g/dL — ABNORMAL LOW (ref 12.0–15.0)
MCH: 31.8 pg (ref 26.0–34.0)
MCHC: 32.8 g/dL (ref 30.0–36.0)
MCV: 96.9 fL (ref 80.0–100.0)
Platelets: 232 10*3/uL (ref 150–400)
RBC: 2.58 MIL/uL — ABNORMAL LOW (ref 3.87–5.11)
RDW: 14.6 % (ref 11.5–15.5)
WBC: 11.1 10*3/uL — ABNORMAL HIGH (ref 4.0–10.5)
nRBC: 0 % (ref 0.0–0.2)

## 2020-06-04 LAB — COMPREHENSIVE METABOLIC PANEL
ALT: 20 U/L (ref 0–44)
AST: 18 U/L (ref 15–41)
Albumin: 2.9 g/dL — ABNORMAL LOW (ref 3.5–5.0)
Alkaline Phosphatase: 76 U/L (ref 38–126)
Anion gap: 11 (ref 5–15)
BUN: 8 mg/dL (ref 6–20)
CO2: 23 mmol/L (ref 22–32)
Calcium: 7.4 mg/dL — ABNORMAL LOW (ref 8.9–10.3)
Chloride: 105 mmol/L (ref 98–111)
Creatinine, Ser: 1.04 mg/dL — ABNORMAL HIGH (ref 0.44–1.00)
GFR, Estimated: >60 mL/min (ref >60)
Glucose, Bld: 94 mg/dL (ref 70–99)
Potassium: 3 mmol/L — ABNORMAL LOW (ref 3.5–5.1)
Sodium: 139 mmol/L (ref 135–145)
Total Bilirubin: 0.7 mg/dL (ref 0.3–1.2)
Total Protein: 5.6 g/dL — ABNORMAL LOW (ref 6.5–8.1)

## 2020-06-04 LAB — CBC WITH DIFFERENTIAL/PLATELET
Abs Immature Granulocytes: 0.55 10*3/uL — ABNORMAL HIGH (ref 0.00–0.07)
Basophils Absolute: 0.1 10*3/uL (ref 0.0–0.1)
Basophils Relative: 1 %
Eosinophils Absolute: 0.6 10*3/uL — ABNORMAL HIGH (ref 0.0–0.5)
Eosinophils Relative: 5 %
HCT: 25.1 % — ABNORMAL LOW (ref 36.0–46.0)
Hemoglobin: 8.3 g/dL — ABNORMAL LOW (ref 12.0–15.0)
Immature Granulocytes: 5 %
Lymphocytes Relative: 16 %
Lymphs Abs: 1.7 10*3/uL (ref 0.7–4.0)
MCH: 31.9 pg (ref 26.0–34.0)
MCHC: 33.1 g/dL (ref 30.0–36.0)
MCV: 96.5 fL (ref 80.0–100.0)
Monocytes Absolute: 1 10*3/uL (ref 0.1–1.0)
Monocytes Relative: 9 %
Neutro Abs: 7 10*3/uL (ref 1.7–7.7)
Neutrophils Relative %: 64 %
Platelets: 229 10*3/uL (ref 150–400)
RBC: 2.6 MIL/uL — ABNORMAL LOW (ref 3.87–5.11)
RDW: 14.6 % (ref 11.5–15.5)
WBC: 10.8 10*3/uL — ABNORMAL HIGH (ref 4.0–10.5)
nRBC: 0 % (ref 0.0–0.2)

## 2020-06-04 LAB — MAGNESIUM: Magnesium: 1.5 mg/dL — ABNORMAL LOW (ref 1.7–2.4)

## 2020-06-04 LAB — PROCALCITONIN: Procalcitonin: 0.13 ng/mL

## 2020-06-04 LAB — LACTIC ACID, PLASMA
Lactic Acid, Venous: 1.1 mmol/L (ref 0.5–1.9)
Lactic Acid, Venous: 1.2 mmol/L (ref 0.5–1.9)

## 2020-06-04 LAB — CREATININE, SERUM
Creatinine, Ser: 0.9 mg/dL (ref 0.44–1.00)
GFR, Estimated: >60 mL/min (ref >60)

## 2020-06-04 MED ORDER — ACETAMINOPHEN 325 MG PO TABS: 650.0000 mg | ORAL_TABLET | Freq: Four times a day (QID) | ORAL | Status: DC | PRN

## 2020-06-04 MED ORDER — ROPINIROLE HCL 1 MG PO TABS
1.0000 mg | ORAL_TABLET | Freq: Every day | ORAL | Status: DC
  Administered 2020-06-04 – 2020-06-06 (×3): 1 mg via ORAL
  Filled 2020-06-04 (×3): qty 1

## 2020-06-04 MED ORDER — MYCOPHENOLATE MOFETIL 250 MG PO CAPS
1000.0000 mg | ORAL_CAPSULE | Freq: Two times a day (BID) | ORAL | Status: DC
  Administered 2020-06-04 – 2020-06-07 (×6): 1000 mg via ORAL
  Filled 2020-06-04 (×7): qty 4

## 2020-06-04 MED ORDER — MESALAMINE 400 MG PO CPDR
400.0000 mg | DELAYED_RELEASE_CAPSULE | Freq: Two times a day (BID) | ORAL | Status: DC
  Administered 2020-06-04 – 2020-06-07 (×6): 400 mg via ORAL
  Filled 2020-06-04 (×7): qty 1

## 2020-06-04 MED ORDER — PREDNISONE 10 MG PO TABS
10.0000 mg | ORAL_TABLET | Freq: Every day | ORAL | Status: DC
  Administered 2020-06-05 – 2020-06-07 (×3): 10 mg via ORAL
  Filled 2020-06-04 (×3): qty 1

## 2020-06-04 MED ORDER — FLUTICASONE FUROATE-VILANTEROL 100-25 MCG/INH IN AEPB
1.0000 | INHALATION_SPRAY | Freq: Every day | RESPIRATORY_TRACT | Status: DC
  Administered 2020-06-07: 1 via RESPIRATORY_TRACT
  Filled 2020-06-04: qty 28

## 2020-06-04 MED ORDER — MAGNESIUM SULFATE 4 GM/100ML IV SOLN
4.0000 g | Freq: Once | INTRAVENOUS | Status: AC
  Administered 2020-06-04: 4 g via INTRAVENOUS
  Filled 2020-06-04: qty 100

## 2020-06-04 MED ORDER — ROSUVASTATIN CALCIUM 10 MG PO TABS
40.0000 mg | ORAL_TABLET | Freq: Every day | ORAL | Status: DC
  Administered 2020-06-04 – 2020-06-07 (×4): 40 mg via ORAL
  Filled 2020-06-04: qty 4
  Filled 2020-06-04 (×3): qty 8
  Filled 2020-06-04: qty 4

## 2020-06-04 MED ORDER — POTASSIUM CHLORIDE CRYS ER 20 MEQ PO TBCR
40.0000 meq | EXTENDED_RELEASE_TABLET | ORAL | Status: DC
  Administered 2020-06-04: 40 meq via ORAL
  Filled 2020-06-04 (×2): qty 2

## 2020-06-04 MED ORDER — CYANOCOBALAMIN 1000 MCG/ML IJ SOLN
1000.0000 ug | INTRAMUSCULAR | Status: DC
  Administered 2020-06-05: 1000 ug via INTRAMUSCULAR
  Filled 2020-06-04: qty 1

## 2020-06-04 MED ORDER — PIPERACILLIN-TAZOBACTAM 3.375 G IVPB
3.3750 g | Freq: Three times a day (TID) | INTRAVENOUS | Status: DC
  Administered 2020-06-04 – 2020-06-06 (×5): 3.375 g via INTRAVENOUS
  Filled 2020-06-04 (×5): qty 50

## 2020-06-04 MED ORDER — SODIUM CHLORIDE 0.9 % IV SOLN
12.5000 mg | Freq: Three times a day (TID) | INTRAVENOUS | Status: DC | PRN
  Administered 2020-06-04 – 2020-06-05 (×3): 12.5 mg via INTRAVENOUS
  Filled 2020-06-04 (×5): qty 0.5

## 2020-06-04 MED ORDER — ALPRAZOLAM 0.5 MG PO TABS
0.5000 mg | ORAL_TABLET | Freq: Every day | ORAL | Status: DC
  Administered 2020-06-04 – 2020-06-06 (×3): 0.5 mg via ORAL
  Filled 2020-06-04 (×3): qty 1

## 2020-06-04 MED ORDER — ACETAMINOPHEN 650 MG RE SUPP: 650.0000 mg | Freq: Four times a day (QID) | RECTAL | Status: DC | PRN

## 2020-06-04 MED ORDER — ROPINIROLE HCL 1 MG PO TABS
1.0000 mg | ORAL_TABLET | Freq: Every day | ORAL | Status: DC
Start: 2020-06-04 — End: 2020-06-04

## 2020-06-04 MED ORDER — ONDANSETRON HCL 4 MG/2ML IJ SOLN
4.0000 mg | Freq: Three times a day (TID) | INTRAMUSCULAR | Status: DC | PRN
  Administered 2020-06-04: 4 mg via INTRAVENOUS
  Filled 2020-06-04: qty 2

## 2020-06-04 MED ORDER — ROPINIROLE HCL 1 MG PO TABS
1.0000 mg | ORAL_TABLET | Freq: Every evening | ORAL | Status: DC | PRN
  Filled 2020-06-04: qty 1

## 2020-06-04 MED ORDER — LACTATED RINGERS IV SOLN: INTRAVENOUS | Status: DC

## 2020-06-04 MED ORDER — ENOXAPARIN SODIUM 40 MG/0.4ML ~~LOC~~ SOLN
40.0000 mg | SUBCUTANEOUS | Status: DC
  Administered 2020-06-04 – 2020-06-07 (×4): 40 mg via SUBCUTANEOUS
  Filled 2020-06-04 (×4): qty 0.4

## 2020-06-04 NOTE — Progress Notes (Signed)
NIF  -50 VC  3.2 L  Good pt effort

## 2020-06-04 NOTE — H&P (Signed)
History and Physical    Teresa Lee SWH:[id number] DOB: 04-Jul-1960 DOA: 06/03/2020  PCP: [name], MD  Patient coming from: Home.  Chief Complaint: Persistent nausea vomiting and diarrhea.  HPI: Teresa Lee is a 60 y.o. female with history of Crohn's disease, myasthenia gravis, anemia who has had recent lumbar surgery last month was admitted couple of weeks ago and discharged last week for acute renal failure with persistent nausea vomiting underwent EGD and colonoscopy and the reasons for the patient's nausea vomiting diarrhea was not clear C. difficile is also negative.  Has been a persistent nausea vomiting had come to the ER about 2 days ago for fall at the time CT abdomen and pelvis and CT head did not show anything acute except for endometrial thickening.  Had followed up with GI and was instructed to come to the ER for admission.  ED Course: Labs reveal hypokalemia hypomagnesemia and leukocytosis patient was febrile with temperature 101.2 F lactic acid was elevated 2.3 which improved with fluids at 1.2.  Patient also had leukocytosis.  Given the symptoms patient presentation is concerning for sepsis likely origin could be intra-abdominal.  Patient's lumbar area on exam does not show any active discharge.  Patient states she does have some mild pain since the surgery.  Has stopped using Mestinon for myasthenia gravis last 1 week due to persistent diarrhea.  Review of Systems: As per HPI, rest all negative.   Past Medical History:  Diagnosis Date  . Arthritis    osteoarthritis  . Asthma   . Blind left eye   . Cancer ([place])    skin  . Crohn's disease ([place])   . CTS (carpal tunnel syndrome)   . DJD (degenerative joint disease)    Both cervical spine and LS spine  . Fibromyalgia   . GERD (gastroesophageal reflux disease)   . Heart murmur   . Herniated nucleus pulposus   . Hyperlipidemia   . Hypertension   . IBS (irritable bowel syndrome)   . Leukocytosis    . Myasthenia gravis   . Myasthenia gravis ([place])   . Myasthenia gravis ([place])   . Pneumonia   . Spinal cord stimulator status     Past Surgical History:  Procedure Laterality Date  . BACK SURGERY    . BIOPSY  05/24/2020   Procedure: BIOPSY;  Surgeon: Ronald Lobo, MD;  Location: [place];  Service: Endoscopy;;  . BREAST REDUCTION SURGERY    . CARPAL TUNNEL RELEASE     bilateral  . cervical disc inj    . CHOLECYSTECTOMY    . COLONOSCOPY WITH PROPOFOL N/A 05/24/2020   Procedure: COLONOSCOPY WITH PROPOFOL;  Surgeon: Ronald Lobo, MD;  Location: [place];  Service: Endoscopy;  Laterality: N/A;  . ENDOMETRIAL ABLATION W/ NOVASURE    . ESOPHAGOGASTRODUODENOSCOPY (EGD) WITH PROPOFOL N/A 05/24/2020   Procedure: ESOPHAGOGASTRODUODENOSCOPY (EGD) WITH PROPOFOL;  Surgeon: Ronald Lobo, MD;  Location: [place];  Service: Endoscopy;  Laterality: N/A;  . LUMBAR LAMINECTOMY/DECOMPRESSION MICRODISCECTOMY Right 05/04/2020   Procedure: Right Lumbar Five Sacral One Microdiscectomy;  Surgeon: Erline Levine, MD;  Location: [place];  Service: Neurosurgery;  Laterality: Right;  posterior  . MELANOMA EXCISION    . NASAL SINUS SURGERY    . SPINAL CORD STIMULATOR INSERTION  2019  . THYMECTOMY     due to myastenia gravis  . THYMECTOMY    . TRANSFORAMINAL LUMBAR INTERBODY FUSION (TLIF) WITH PEDICLE SCREW FIXATION 1 LEVEL Right 05/04/2020   Procedure: Right Lumbar  Four-Five Transforaminal lumbar interbody fusion;  Surgeon: Erline Levine, MD;  Location: [place];  Service: Neurosurgery;  Laterality: Right;  posterior     reports that she has never smoked. She has never used smokeless tobacco. She reports current alcohol use. She reports that she does not use drugs.  Allergies  Allergen Reactions  . Duloxetine Nausea Only  . Latex Other (See Comments)    BLISTER  . Lorazepam Other (See Comments)    Hallucinations  . Orange Concentrate [Flavoring Agent] Other (See Comments)    Acid reflux  .  Other Hives, Nausea Only and Other (See Comments)    Some nuts cause a rash  . Percocet [Oxycodone-Acetaminophen] Hives  . Sulfa Antibiotics Hives  . Bioflavonoids Rash    Causes burning of skin and blisters  . Cefixime Other (See Comments)    Due to myasthenia gravis  . Nucynta [Tapentadol] Other (See Comments)    Headache     Family History  Problem Relation Age of Onset  . Asthma Daughter   . Heart disease Mother   . Cancer Mother   . Hyperlipidemia Mother   . Hypertension Mother   . Diabetes Father     Prior to Admission medications   Medication Sig Start Date End Date Taking? Authorizing Provider  albuterol (PROVENTIL) (2.5 MG/3ML) 0.083% nebulizer solution Take 2.5 mg by nebulization every 6 (six) hours as needed for shortness of breath. 12/26/19  Yes [provider]  albuterol (VENTOLIN HFA) 108 (90 Base) MCG/ACT inhaler 1 PUFF EVERY 6 HOURS AS NEEDED FOR WHEEZING Patient taking differently: Inhale 1 puff into the lungs every 6 (six) hours as needed for wheezing or shortness of breath. 09/05/19  Yes Martinique, Betty G, MD  ALPRAZolam Duanne Moron) 0.5 MG tablet Take 0.5 mg by mouth at bedtime.   Yes [provider]  Calcium Carbonate (CALCIUM 500 PO) Take 1 tablet by mouth daily.   Yes [provider]  cyanocobalamin (,VITAMIN B-12,) 1000 MCG/ML injection Inject 1,000 mcg into the muscle every 30 (thirty) days.   Yes [provider]  estradiol (ESTRACE) 2 MG tablet Take 2 mg by mouth at bedtime.   Yes [provider]  HYDROcodone-acetaminophen (NORCO) 10-325 MG tablet Take 1 tablet by mouth 5 (five) times daily as needed for moderate pain. 08/25/17  Yes [provider]  loperamide (IMODIUM) 2 MG capsule Take 2 capsules (4 mg total) by mouth as needed for diarrhea or loose stools. 05/28/20  Yes Donne Hazel, MD  magnesium oxide (MAG-OX) 400 MG tablet Take 400 mg by mouth daily.   Yes [provider]  Mesalamine (ASACOL) 400  MG CPDR DR capsule Take 400 mg by mouth 2 (two) times daily. 12/10/18  Yes [provider]  methocarbamol (ROBAXIN) 750 MG tablet Take 750 mg by mouth at bedtime. 02/07/19  Yes [provider]  mupirocin ointment (BACTROBAN) 2 % Apply 1 application topically 2 (two) times daily. 05/31/20 05/31/21 Yes [provider]  mycophenolate (CELLCEPT) 500 MG tablet Take 1,000 mg by mouth 2 (two) times daily.    Yes [provider]  naloxone (NARCAN) nasal spray 4 mg/0.1 mL Place 1 spray into the nose once as needed (opioid overdose).   Yes [provider]  pantoprazole (PROTONIX) 40 MG tablet TAKE 1 TABLET DAILY Patient taking differently: Take 40 mg by mouth 2 (two) times daily. 10/15/19  Yes [place], Betty G, MD  predniSONE (DELTASONE) 10 MG tablet Take 10 mg by mouth every  morning.   Yes [provider]  progesterone (PROMETRIUM) 100 MG capsule Take 100 mg by mouth at bedtime.   Yes [provider]  promethazine (PHENERGAN) 25 MG tablet Take 25 mg by mouth every 6 (six) hours as needed for nausea. 05/31/20  Yes [provider]  PROMETHEGAN 25 MG suppository Place 25 mg rectally every 6 (six) hours as needed for nausea. 05/31/20  Yes [provider]  rOPINIRole (REQUIP) 1 MG tablet Take 1-2 tablets (1-2 mg total) by mouth at bedtime. Patient taking differently: Take 1 mg by mouth See admin instructions. Take 1 tablet (1 mg) by mouth daily at bedtime, may take a 2nd tablet (1 mg) later if still needed for restless legs 08/08/19  Yes [place], Betty G, MD  rosuvastatin (CRESTOR) 40 MG tablet Take 1 tablet (40 mg total) by mouth daily. 02/02/19  Yes [place], Betty G, MD  SYMBICORT 80-4.5 MCG/ACT inhaler Inhale 2 puffs into the lungs 2 (two) times daily. Patient taking differently: Inhale 2 puffs into the lungs 2 (two) times daily as needed (shortness of breath). 09/05/19  Yes [place], Betty G, MD  potassium chloride 20 MEQ TBCR Take 20 mEq by  mouth daily for 5 days. 06/02/20 06/07/20  Marcello Fennel, PA-C  pyridostigmine (MESTINON) 60 MG tablet Take 30 mg by mouth daily. 12/27/17   [provider]  Spacer/Aero-Holding Chambers (AEROCHAMBER PLUS) inhaler Use as instructed to use with inahaler. 08/30/18   Martin[place], Betty G, MD    Physical Exam: Constitutional: Moderately built and nourished. Vitals:   06/03/20 2245 06/03/20 2300 06/03/20 2315 06/03/20 2330  BP:  124/70  138/74  Pulse: 89 86 89 86  Resp: 15 17 (!) 25 13  Temp:      TempSrc:      SpO2: 98% 97% 100% 99%   Eyes: Anicteric no pallor. ENMT: No discharge from the ears eyes nose or mouth. Neck: No muscle.  No neck rigidity. Respiratory: No rhonchi or crepitations. Cardiovascular: S1-S2 heard. Abdomen: Soft nontender bowel sounds present. Musculoskeletal: No edema. Skin: No rash. Neurologic: Alert awake oriented to time place and person.  Moves all extremities. Psychiatric: Appears normal.  Normal affect.   Labs on Admission: I have personally reviewed following labs and imaging studies  CBC: Recent Labs  Lab 06/02/20 1959 06/03/20 1312  WBC 15.7* 16.9*  NEUTROABS 11.3*  --   HGB 10.4* 10.5*  HCT 32.2* 31.0*  MCV 98.2 94.2  PLT 286 109   Basic Metabolic Panel: Recent Labs  Lab 05/28/20 0307 06/02/20 1959 06/03/20 1312 06/03/20 1652  NA 142 141 139  --   K 3.4* 2.9* 2.9*  --   CL 112* 104 106  --   CO2 18* 20* 18*  --   GLUCOSE 100* 112* 115*  --   BUN 23* 19 12  --   CREATININE 1.79* 1.15* 1.13*  --   CALCIUM 7.1* 7.6* 8.1*  --   MG  --   --   --  1.2*  PHOS 3.6  --   --   --    GFR: Estimated Creatinine Clearance: 46.3 mL/min (A) (by C-G formula based on SCr of 1.13 mg/dL (H)). Liver Function Tests: Recent Labs  Lab 05/28/20 0307 06/02/20 1959 06/03/20 1312  AST  --  36 28  ALT  --  29 28  ALKPHOS  --  85 96  BILITOT  --  0.5 0.8  PROT  --  7.0 7.3  ALBUMIN 2.7*  3.6 4.0   Recent Labs  Lab 06/02/20 1959  06/03/20 1312  LIPASE 40 36   No results for input(s): AMMONIA in the last 168 hours. Coagulation Profile: Recent Labs  Lab 06/03/20 1638  INR 1.1   Cardiac Enzymes: No results for input(s): CKTOTAL, CKMB, CKMBINDEX, TROPONINI in the last 168 hours. BNP (last 3 results) No results for input(s): PROBNP in the last 8760 hours. HbA1C: No results for input(s): HGBA1C in the last 72 hours. CBG: No results for input(s): GLUCAP in the last 168 hours. Lipid Profile: No results for input(s): CHOL, HDL, LDLCALC, TRIG, CHOLHDL, LDLDIRECT in the last 72 hours. Thyroid Function Tests: No results for input(s): TSH, T4TOTAL, FREET4, T3FREE, THYROIDAB in the last 72 hours. Anemia Panel: No results for input(s): VITAMINB12, FOLATE, FERRITIN, TIBC, IRON, RETICCTPCT in the last 72 hours. Urine analysis:    Component Value Date/Time   COLORURINE YELLOW 06/03/2020 2030   APPEARANCEUR HAZY (A) 06/03/2020 2030   LABSPEC 1.033 (H) 06/03/2020 2030   PHURINE 5.0 06/03/2020 2030   GLUCOSEU NEGATIVE 06/03/2020 2030   [place] NEGATIVE 06/03/2020 2030   [place] NEGATIVE 06/03/2020 2030   KETONESUR 5 (A) 06/03/2020 2030   PROTEINUR >=300 (A) 06/03/2020 2030   NITRITE NEGATIVE 06/03/2020 2030   LEUKOCYTESUR NEGATIVE 06/03/2020 2030   Sepsis Labs: @LABRCNTIP (procalcitonin:4,lacticidven:4) ) Recent Results (from the past 240 hour(s))  Resp Panel by RT-PCR (Flu A&B, Covid) Nasopharyngeal Swab     Status: None   Collection Time: 06/03/20  4:38 PM   Specimen: Nasopharyngeal Swab; Nasopharyngeal(NP) swabs in vial transport medium  Result Value Ref Range Status   SARS Coronavirus 2 by RT PCR NEGATIVE NEGATIVE Final    Comment: (NOTE) SARS-CoV-2 target nucleic acids are NOT DETECTED.  The SARS-CoV-2 RNA is generally detectable in upper respiratory specimens during the acute phase of infection. The lowest concentration of SARS-CoV-2 viral copies this assay can detect is 138 copies/mL. A negative  result does not preclude SARS-Cov-2 infection and should not be used as the sole basis for treatment or other patient management decisions. A negative result may occur with  improper specimen collection/handling, submission of specimen other than nasopharyngeal swab, presence of viral mutation(s) within the areas targeted by this assay, and inadequate number of viral copies(<138 copies/mL). A negative result must be combined with clinical observations, patient history, and epidemiological information. The expected result is Negative.  Fact Sheet for Patients:  EntrepreneurPulse.com.au  Fact Sheet for Healthcare Providers:  IncredibleEmployment.be  This test is no t yet approved or cleared by the [place] FDA and  has been authorized for detection and/or diagnosis of SARS-CoV-2 by FDA under an Emergency Use Authorization (EUA). This EUA will remain  in effect (meaning this test can be used) for the duration of the COVID-19 declaration under Section 564(b)(1) of the Act, 21 U.S.C.section 360bbb-3(b)(1), unless the authorization is terminated  or revoked sooner.       Influenza A by PCR NEGATIVE NEGATIVE Final   Influenza B by PCR NEGATIVE NEGATIVE Final    Comment: (NOTE) The Xpert Xpress SARS-CoV-2/FLU/RSV plus assay is intended as an aid in the diagnosis of influenza from Nasopharyngeal swab specimens and should not be used as a sole basis for treatment. Nasal washings and aspirates are unacceptable for Xpert Xpress SARS-CoV-2/FLU/RSV testing.  Fact Sheet for Patients: EntrepreneurPulse.com.au  Fact Sheet for Healthcare Providers: IncredibleEmployment.be  This test is not yet approved or cleared by the [place] FDA and has been authorized for detection and/or diagnosis  of SARS-CoV-2 by FDA under an Emergency Use Authorization (EUA). This EUA will remain in effect (meaning this test can be used)  for the duration of the COVID-19 declaration under Section 564(b)(1) of the Act, 21 U.S.C. section 360bbb-3(b)(1), unless the authorization is terminated or revoked.  Performed at [facility], [place] [address] [place], [place] [address]      Radiological Exams on Admission: DG Chest 2 View  Result Date: 06/02/2020 CLINICAL DATA:  Fall in the shower.  Chest pain. EXAM: CHEST - 2 VIEW COMPARISON:  02/21/2019 FINDINGS: Post median sternotomy. Lower most sternal wire is broken, unchanged. The heart is normal in size. No pneumothorax or pleural effusion. No focal airspace disease. Spinal stimulator is in place. No acute osseous abnormalities are seen. IMPRESSION: No acute findings or traumatic injury to the thorax. Electronically Signed   By: Keith Rake M.D.   On: 06/02/2020 19:53   CT Head Wo Contrast  Result Date: 06/02/2020 CLINICAL DATA:  [place] and hit head abrasion and bruising EXAM: CT HEAD WITHOUT CONTRAST CT CERVICAL SPINE WITHOUT CONTRAST TECHNIQUE: Multidetector CT imaging of the head and cervical spine was performed following the standard protocol without intravenous contrast. Multiplanar CT image reconstructions of the cervical spine were also generated. COMPARISON:  CT 09/06/2017 FINDINGS: CT HEAD FINDINGS Brain: No acute territorial infarction, hemorrhage or intracranial mass is visualized. Absence of the septum pellucidum. 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 Stable ventricle size and morphology. Vascular: No hyperdense vessels.  Carotid vascular calcification Skull: Normal. Negative for fracture or focal lesion. Sinuses/Orbits: No acute finding. Other: None CT CERVICAL SPINE FINDINGS Alignment: Reversal of cervical lordosis. 3 mm anterolisthesis C3 on C4. Similar trace retrolisthesis C4 on C5. Facet  alignment is maintained Skull base and vertebrae: No acute fracture. No primary bone lesion or focal pathologic process. Soft tissues and spinal canal: No prevertebral fluid or swelling. No visible canal hematoma. Disc levels: Advanced degenerative changes C4-C5, C5-C6 and C6-C7 with disc space narrowing and osteophyte. Facet degenerative changes at multiple levels. Upper chest: Negative. Other: None IMPRESSION: 1. No CT evidence for acute intracranial abnormality. 2. 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 3. Reversal of cervical lordosis with advanced degenerative changes. No fracture seen. Slight increased anterolisthesis C3 on C4 likely due to posterior facet degenerative change which has progressed. Electronically Signed   By: Donavan Foil M.D.   On: 06/02/2020 21:53   CT Cervical Spine Wo Contrast  Result Date: 06/02/2020 CLINICAL DATA:  [place] and hit head abrasion and bruising EXAM: CT HEAD WITHOUT CONTRAST CT CERVICAL SPINE WITHOUT CONTRAST TECHNIQUE: Multidetector CT imaging of the head and cervical spine was performed following the standard protocol without intravenous contrast. Multiplanar CT image reconstructions of the cervical spine were also generated. COMPARISON:  CT 09/06/2017 FINDINGS: CT HEAD FINDINGS Brain: No acute territorial infarction, hemorrhage or intracranial mass is visualized. Absence of the septum pellucidum. 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 Stable ventricle size and morphology. Vascular: No hyperdense vessels.  Carotid vascular calcification Skull: Normal. Negative for fracture or focal lesion. Sinuses/Orbits: No acute finding. Other: None CT CERVICAL SPINE FINDINGS Alignment: Reversal of cervical lordosis. 3  mm anterolisthesis C3 on C4. Similar trace  retrolisthesis C4 on C5. Facet alignment is maintained Skull base and vertebrae: No acute fracture. No primary bone lesion or focal pathologic process. Soft tissues and spinal canal: No prevertebral fluid or swelling. No visible canal hematoma. Disc levels: Advanced degenerative changes C4-C5, C5-C6 and C6-C7 with disc space narrowing and osteophyte. Facet degenerative changes at multiple levels. Upper chest: Negative. Other: None IMPRESSION: 1. No CT evidence for acute intracranial abnormality. 2. 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 3. Reversal of cervical lordosis with advanced degenerative changes. No fracture seen. Slight increased anterolisthesis C3 on C4 likely due to posterior facet degenerative change which has progressed. Electronically Signed   By: Donavan Foil M.D.   On: 06/02/2020 21:53   CT Abdomen Pelvis W Contrast  Result Date: 06/02/2020 CLINICAL DATA:  Diarrhea with nausea and vomiting. Acute abdominal pain. Fall in the shower. EXAM: CT ABDOMEN AND PELVIS WITH CONTRAST TECHNIQUE: Multidetector CT imaging of the abdomen and pelvis was performed using the standard protocol following bolus administration of intravenous contrast. CONTRAST:  141m OMNIPAQUE IOHEXOL 300 MG/ML  SOLN COMPARISON:  Abdominal CT 11 days ago 05/22/2020 FINDINGS: Lower chest: Minor hypoventilatory atelectasis in the left greater than right lower lobe. Hepatobiliary: Diffusely decreased hepatic density typical of steatosis. No focal hepatic abnormality. Gallbladder physiologically distended, no calcified stone. No biliary dilatation. Pancreas: No ductal dilatation or inflammation. Spleen: Normal in size without focal abnormality. Small splenule anteriorly near the pancreatic tail. Adrenals/Urinary Tract: Normal adrenal glands. No hydronephrosis or perinephric edema. Homogeneous renal enhancement with symmetric excretion on delayed phase imaging. Minimal  cortical scarring in the upper left kidney. Urinary bladder is physiologically distended without wall thickening. Stomach/Bowel: The stomach is unremarkable. Normal positioning of the duodenum and ligament of Treitz. Occasional fluid-filled small bowel that is nonobstructed, inflamed or abnormally dilated. Normal appendix. Small volume of formed stool throughout the colon. No liquid stool. No colonic wall thickening. No pericolonic inflammation. Vascular/Lymphatic: Aortic atherosclerosis. No aneurysm. Patent portal vein. Retroaortic left renal vein. No abdominopelvic adenopathy. Reproductive: 11 mm left ovarian/adnexal cyst needs no further follow-up. Endometrium measures approximately 6 mm. Other: No free fluid or free air. Spinal stimulator with battery pack in the right, partially included. Musculoskeletal: Lumbar spine assessed on concurrent lumbar spine reformats. Occasional bone islands in the pelvis. No acute pelvic fracture. IMPRESSION: 1. No acute abnormality or explanation for abdominal pain and diarrhea. 2. Mild endometrial thickening. Nonemergent pelvic ultrasound is again recommended for further evaluation on an elective basis. 3. Hepatic steatosis. Aortic Atherosclerosis (ICD10-I70.0). Electronically Signed   By: MKeith RakeM.D.   On: 06/02/2020 21:50   CT L-SPINE NO CHARGE  Result Date: 06/02/2020 CLINICAL DATA:  Initial evaluation for acute trauma, fall, recent surgery. EXAM: CT LUMBAR SPINE WITHOUT CONTRAST TECHNIQUE: Multidetector CT imaging of the lumbar spine was performed without intravenous contrast administration. Multiplanar CT image reconstructions were also generated. COMPARISON:  Prior radiograph from 05/04/2020. FINDINGS: Segmentation: Standard. Lowest well-formed disc space labeled the L5-S1 level. Alignment: Trace 2 mm anterolisthesis of L4 on L5. Alignment otherwise normal preservation of the normal lumbar lordosis. Vertebrae: Vertebral body height maintained without acute or  chronic fracture. Visualized sacrum and pelvis intact. SI joints approximated symmetric. No discrete or worrisome osseous lesions. Sequelae of recent PLIF at L4-5. Hardware is intact and well-positioned. No periprosthetic lucency to suggest lucency or failure. Paraspinal and other soft tissues: Normal expected postoperative changes present within  the posterior paraspinous soft tissues. Paraspinous soft tissues demonstrate no other acute finding. Generator for spinal stimulator device present within the subcutaneous fat of the right flank. Electrodes seen coursing cephalad within the dorsal epidural space, entering the spinal canal at the level of T11-12. Visualized visceral structures within normal limits. Retro Dick left renal vein noted. Mild aortic atherosclerosis. Disc levels: L1-2:  Unremarkable. L2-3: Small biforaminal disc protrusions, slightly larger on the right. No spinal stenosis. Foramina remain patent. L3-4: Minimal disc bulge, eccentric to the right. Superimposed right foraminal to extraforaminal disc protrusion with annular calcification (series 8, image 100). Mild facet hypertrophy. No significant spinal stenosis. Mild to moderate right with mild left L3 foraminal stenosis. L4-5: Recent PLIF. No appreciable residual spinal stenosis. Foramina appear grossly patent. L5-S1: Broad-based central to right subarticular disc protrusion with slight caudad angulation. Minimal facet hypertrophy. No significant spinal stenosis. Foramina remain patent. IMPRESSION: 1. No acute traumatic injury within the lumbar spine. 2. Sequelae of recent PLIF at L2-7 without complication. No significant residual spinal stenosis. Foramina remain patent at this time. 3. Broad-based central to right subarticular disc protrusion at L5-S1, closely approximating the descending right S1 nerve root, similar to previous. 4. Right foraminal to extraforaminal disc protrusion at L3-4, potentially irritating the exiting right L3 nerve root.  Electronically Signed   By: Jeannine Boga M.D.   On: 06/02/2020 22:48   DG Chest Port 1 View  Result Date: 06/03/2020 CLINICAL DATA:  Poor p.o. intake and hypokalemia EXAM: PORTABLE CHEST 1 VIEW COMPARISON:  06/02/2020, 02/21/2019 FINDINGS: Post sternotomy changes. Ascending thoracic stimulator leads. No focal opacity or pleural effusion. Normal cardiomediastinal silhouette. No pneumothorax. IMPRESSION: No active disease. Electronically Signed   By: Donavan Foil M.D.   On: 06/03/2020 18:23   DG Hip Unilat W or Wo Pelvis 2-3 Views Right  Result Date: 06/02/2020 CLINICAL DATA:  Fall in the shower with right hip pain. EXAM: DG HIP (WITH OR WITHOUT PELVIS) 2-3V RIGHT COMPARISON:  Radiograph 07/14/2019 FINDINGS: The cortical margins of the bony pelvis and right hip are intact. No fracture. Pubic symphysis and sacroiliac joints are congruent. The pubic rami are intact. The femoral head is well seated in the acetabulum. Joint space is preserved. Surgical hardware in the lower lumbar spine partially included. Bone island in the right pelvis. IMPRESSION: No fracture of the pelvis or right hip. Electronically Signed   By: Keith Rake M.D.   On: 06/02/2020 19:55    EKG: Independently reviewed.  Normal sinus rhythm.  Assessment/Plan Principal Problem:   SIRS (systemic inflammatory response syndrome) (HCC) Active Problems:   Essential hypertension   Sepsis ([place])   Myasthenia gravis ([place])   Crohn disease ([place])   Diarrhea    1. Sepsis likely source could be intra-abdominal.  Recent admission for acute renal failure about 2 weeks ago at the time patient had EGD and colonoscopy on May 24, 2020 all of them which did not show any particular reason for patient's vomiting and diarrhea.  GI pathogen panel and C. difficile were negative at that time.  We will keep patient on empiric antibiotics Zosyn was started in the ER continue with hydration.  Reconsult GI in the morning.  If patient's fever  remains persistent then may consider MRI of the lumbar spine given recent surgery. 2. Hypokalemia and hypomagnesemia likely from GI loss.  Replace recheck. 3. Myasthenia gravis no signs of exacerbation at this time.  Will check NIF and vital capacity closely.  Patient is on prednisone  and CellCept.  Mestinon was recently discontinued due to diarrhea. 4. Crohn's disease on mesalamine.  Patient states her diarrhea is completely different from her Crohn's exacerbation diarrhea. 5. Chronic kidney disease stage III creatinine appears to be at baseline. 6. Hyperlipidemia on statins. 7. Recent lumbar surgery on February 2022.  If patient's fever persist then may consider lumbar spine MRI.  Given that patient has septic picture on presentation will need inpatient status.   DVT prophylaxis: Lovenox. Code Status: Full code. Family Communication: Discussed with patient. Disposition Plan: Home. Consults called: None. Admission status: Inpatient.   Rise Patience MD [facility] Pager [phone].  If 7PM-7AM, please contact night-coverage [email or web address] Password [facility]  06/04/2020, 12:32 AM

## 2020-06-04 NOTE — Progress Notes (Signed)
RT NOTE:  NIF -40(+) VC 3L   Good pt effort.

## 2020-06-04 NOTE — ED Notes (Signed)
Pt is refusing her potassium pills at this time as she states that she is feeling nauseous and will be unable to keep it down. This nurse will contact hospitalist to make them aware and request antinausea medication

## 2020-06-04 NOTE — Progress Notes (Signed)
NIF  50 VC 3.2

## 2020-06-04 NOTE — Progress Notes (Signed)
PROGRESS NOTE  Teresa Lee AVW:[id number] DOB: April 29, 1960   PCP: [name], MD  Patient is from: Home.  Lives with her husband.  Uses walker at baseline.  DOA: 06/03/2020 LOS: 1  Chief complaints: Nausea and diarrhea  Brief Narrative / Interim history: 60 year old F with PMH of Crohn's disease, myasthenia gravis, recent  back surgery, CKD-3, anemia, HTN, HLD and recent hospitalization from 3/5-3/11 for AKI in the setting of nausea, vomiting and diarrhea returning with complaints of nausea and vomiting.  C. Difficile, GI panel, EGD and colo without significant finding to explain patient's GI symptoms. Patient was discharged on loperamide for outpatient follow-up with GI.   Patient was seen in ED on 3/16.  Basic labs, CT abdomen and pelvis, CT lumbar spine, CT head and CT cervical spine without acute finding.  She was discharged home from the ED.   In the ED, febrile to 101.2, tachycardic and slightly tachypneic.  BP slightly elevated.  Significant labs include K2.9, bicarb 20, Cr 1.13 (1.79 on 3/11), WBC 16 with left shift, Hgb 10.4 (8.4 on 3/10), lactic acid 2.3 but improved to 1.2 with IVF.  Cultures obtained.  Started on IV Zosyn, and admitted for possible sepsis with possible intra-abdominal infection vs viral infection.  Started on IV Zosyn.  Subjective: Seen and examined earlier this morning.  No major events overnight of this morning.  Complains of nausea and diarrhea.  She had one episode of emesis.  Both diarrhea and emesis was nonbloody.  She denies abdominal pain although she has cramping at times.  She has nausea and diarrhea for about 3 weeks now.  She says she has had 5 episodes of diarrhea between 6 and 11 AM.   Objective: Vitals:   06/04/20 0330 06/04/20 0630 06/04/20 0930 06/04/20 1305  BP: 116/71 126/66 112/63 122/76  Pulse: 84 94 93 93  Resp: 16 15 18 14   Temp:      TempSrc:      SpO2: 96% 98% 96% 97%    Intake/Output Summary (Last 24 hours) at  06/04/2020 1424 Last data filed at 06/04/2020 1201 Gross per 24 hour  Intake 1500 ml  Output -  Net 1500 ml   There were no vitals filed for this visit.  Examination:  GENERAL: No apparent distress.  Nontoxic. HEENT: MMM.  Vision and hearing grossly intact.  NECK: Supple.  No apparent JVD.  RESP: On RA.  No IWOB.  Fair aeration bilaterally. CVS:  RRR. Heart sounds normal.  ABD/GI/GU: BS+. Abd soft, NTND.  MSK/EXT:  Moves extremities. No apparent deformity. No edema.  SKIN: no apparent skin lesion or wound NEURO: Awake, alert and oriented appropriately.  No apparent focal neuro deficit. PSYCH: Calm. Normal affect.   Procedures:  None  Microbiology summarized: Influenza and COVID-19 PCR nonreactive. C. difficile and GIP pending. Blood and urine culture pending.  Assessment & Plan: Nausea/vomiting/diarrhea-ongoing issue for about 3 weeks now.  Admitted for the same 3/5-12/11.  C. difficile, GIB, EGD and colonoscopy unrevealing at that time.    She was discharged on Imodium to follow-up with GI outpatient.  Patient has fever with leukocytosis but no significant abdominal pain or tenderness to suggest infectious or inflammatory process.  Procalcitonin basically negative.  CT a/p on 3/16 without significant finding. -Follow repeat C. difficile and GI panel-if negative, start Imodium -Continue IV fluid -Continue IV Zosyn for now pending cultures and stool results -Antiemetics as needed-prefers Phenergan. -Check stool chemistry -Follow GI recommendations  SIRS: Unclear source  of infection.  Diarrhea seems to be functional vs. infectious process.  Patient has recent back surgery but CT lumbar spine without significant finding.  Leukocytosis improved.  Lactic acidosis resolved.  There could be some element of hemoconcentration from dehydration but she has fever.  -Continue IV Zosyn pending cultures  Hypokalemia/hypomagnesemia: Likely due to GI loss. -Replenish and recheck  Myasthenia  gravis-no signs of exacerbation.  NIF -40.  VT 3 L.  Mestinon recently discontinued due to diarrhea. -Continue home prednisone and CellCept  CKD-3A: Cr better than recent values Recent Labs    05/22/20 0800 05/23/20 0247 05/24/20 0802 05/25/20 0357 05/26/20 0442 05/27/20 0223 05/28/20 0307 06/02/20 1959 06/03/20 1312 06/04/20 0029 06/04/20 0626  BUN 92* 67* 43* 31* 25* 25* 23* 19 12  --  8  CREATININE 5.47* 4.16* 3.06* 2.55* 2.16* 2.14* 1.79* 1.15* 1.13* 0.90 1.04*  -Continue monitoring.  History of lumbar stenosis s/p PLIF by Dr. [name].  CT lumbar spine on 3/16 reassuring. -Outpatient follow-up with neurosurgery.  Debility/physical deconditioning-uses walker at baseline.  Lives with husband who is having valve replacement surgery -PT/OT eval  There is no height or weight on file to calculate BMI.         DVT prophylaxis:  enoxaparin (LOVENOX) injection 40 mg Start: 06/04/20 1000  Code Status: Full code Family Communication: Patient and/or RN. Available if any question.  Level of care: Progressive Status is: Inpatient  Remains inpatient appropriate because:Persistent severe electrolyte disturbances, Ongoing diagnostic testing needed not appropriate for outpatient work up, IV treatments appropriate due to intensity of illness or inability to take PO and Inpatient level of care appropriate due to severity of illness   Dispo: The patient is from: Home              Anticipated d/c is to: Home              Patient currently is not medically stable to d/c.   Difficult to place patient No       Consultants:  Gastroenterology   Sch Meds:  Scheduled Meds: . enoxaparin (LOVENOX) injection  40 mg Subcutaneous Q24H  . potassium chloride  40 mEq Oral Q4H   Continuous Infusions: . magnesium sulfate bolus IVPB    . piperacillin-tazobactam (ZOSYN)  IV 3.375 g (06/04/20 1159)  . promethazine (PHENERGAN) injection     PRN Meds:.acetaminophen **OR** acetaminophen,  HYDROcodone-acetaminophen, promethazine (PHENERGAN) injection  Antimicrobials: Anti-infectives (From admission, onward)   Start     Dose/Rate Route Frequency Ordered Stop   06/04/20 1200  piperacillin-tazobactam (ZOSYN) IVPB 3.375 g        3.375 g 12.5 mL/hr over 240 Minutes Intravenous Every 8 hours 06/04/20 1056     06/03/20 1700  piperacillin-tazobactam (ZOSYN) IVPB 3.375 g        3.375 g 100 mL/hr over 30 Minutes Intravenous  Once 06/03/20 1645 06/03/20 2009       I have personally reviewed the following labs and images: CBC: Recent Labs  Lab 06/02/20 1959 06/03/20 1312 06/04/20 0029 06/04/20 0626  WBC 15.7* 16.9* 11.1* 10.8*  NEUTROABS 11.3*  --   --  7.0  HGB 10.4* 10.5* 8.2* 8.3*  HCT 32.2* 31.0* 25.0* 25.1*  MCV 98.2 94.2 96.9 96.5  PLT 286 320 232 229   BMP &GFR Recent Labs  Lab 06/02/20 1959 06/03/20 1312 06/03/20 1652 06/04/20 0029 06/04/20 0626  NA 141 139  --   --  139  K 2.9* 2.9*  --   --  3.0*  CL 104 106  --   --  105  CO2 20* 18*  --   --  23  GLUCOSE 112* 115*  --   --  94  BUN 19 12  --   --  8  CREATININE 1.15* 1.13*  --  0.90 1.04*  CALCIUM 7.6* 8.1*  --   --  7.4*  MG  --   --  1.2*  --  1.5*   Estimated Creatinine Clearance: 50.4 mL/min (A) (by C-G formula based on SCr of 1.04 mg/dL (H)). Liver & Pancreas: Recent Labs  Lab 06/02/20 1959 06/03/20 1312 06/04/20 0626  AST 36 28 18  ALT 29 28 20   ALKPHOS 85 96 76  BILITOT 0.5 0.8 0.7  PROT 7.0 7.3 5.6*  ALBUMIN 3.6 4.0 2.9*   Recent Labs  Lab 06/02/20 1959 06/03/20 1312  LIPASE 40 36   No results for input(s): AMMONIA in the last 168 hours. Diabetic: No results for input(s): HGBA1C in the last 72 hours. No results for input(s): GLUCAP in the last 168 hours. Cardiac Enzymes: No results for input(s): CKTOTAL, CKMB, CKMBINDEX, TROPONINI in the last 168 hours. No results for input(s): PROBNP in the last 8760 hours. Coagulation Profile: Recent Labs  Lab 06/03/20 1638  INR  1.1   Thyroid Function Tests: No results for input(s): TSH, T4TOTAL, FREET4, T3FREE, THYROIDAB in the last 72 hours. Lipid Profile: No results for input(s): CHOL, HDL, LDLCALC, TRIG, CHOLHDL, LDLDIRECT in the last 72 hours. Anemia Panel: No results for input(s): VITAMINB12, FOLATE, FERRITIN, TIBC, IRON, RETICCTPCT in the last 72 hours. Urine analysis:    Component Value Date/Time   COLORURINE YELLOW 06/03/2020 2030   APPEARANCEUR HAZY (A) 06/03/2020 2030   LABSPEC 1.033 (H) 06/03/2020 2030   PHURINE 5.0 06/03/2020 2030   GLUCOSEU NEGATIVE 06/03/2020 2030   HGBUR NEGATIVE 06/03/2020 2030   [place] NEGATIVE 06/03/2020 2030   KETONESUR 5 (A) 06/03/2020 2030   PROTEINUR >=300 (A) 06/03/2020 2030   NITRITE NEGATIVE 06/03/2020 2030   LEUKOCYTESUR NEGATIVE 06/03/2020 2030   Sepsis Labs: Invalid input(s): PROCALCITONIN, [place]  Microbiology: Recent Results (from the past 240 hour(s))  Resp Panel by RT-PCR (Flu A&B, Covid) Nasopharyngeal Swab     Status: None   Collection Time: 06/03/20  4:38 PM   Specimen: Nasopharyngeal Swab; Nasopharyngeal(NP) swabs in vial transport medium  Result Value Ref Range Status   SARS Coronavirus 2 by RT PCR NEGATIVE NEGATIVE Final    Comment: (NOTE) SARS-CoV-2 target nucleic acids are NOT DETECTED.  The SARS-CoV-2 RNA is generally detectable in upper respiratory specimens during the acute phase of infection. The lowest concentration of SARS-CoV-2 viral copies this assay can detect is 138 copies/mL. A negative result does not preclude SARS-Cov-2 infection and should not be used as the sole basis for treatment or other patient management decisions. A negative result may occur with  improper specimen collection/handling, submission of specimen other than nasopharyngeal swab, presence of viral mutation(s) within the areas targeted by this assay, and inadequate number of viral copies(<138 copies/mL). A negative result must be combined  with clinical observations, patient history, and epidemiological information. The expected result is Negative.  Fact Sheet for Patients:  EntrepreneurPulse.com.au  Fact Sheet for Healthcare Providers:  IncredibleEmployment.be  This test is no t yet approved or cleared by the [place] FDA and  has been authorized for detection and/or diagnosis of SARS-CoV-2 by FDA under an Emergency Use Authorization (EUA). This EUA will remain  in  effect (meaning this test can be used) for the duration of the COVID-19 declaration under Section 564(b)(1) of the Act, 21 U.S.C.section 360bbb-3(b)(1), unless the authorization is terminated  or revoked sooner.       Influenza A by PCR NEGATIVE NEGATIVE Final   Influenza B by PCR NEGATIVE NEGATIVE Final    Comment: (NOTE) The Xpert Xpress SARS-CoV-2/FLU/RSV plus assay is intended as an aid in the diagnosis of influenza from Nasopharyngeal swab specimens and should not be used as a sole basis for treatment. Nasal washings and aspirates are unacceptable for Xpert Xpress SARS-CoV-2/FLU/RSV testing.  Fact Sheet for Patients: EntrepreneurPulse.com.au  Fact Sheet for Healthcare Providers: IncredibleEmployment.be  This test is not yet approved or cleared by the [place] FDA and has been authorized for detection and/or diagnosis of SARS-CoV-2 by FDA under an Emergency Use Authorization (EUA). This EUA will remain in effect (meaning this test can be used) for the duration of the COVID-19 declaration under Section 564(b)(1) of the Act, 21 U.S.C. section 360bbb-3(b)(1), unless the authorization is terminated or revoked.  Performed at [facility], [place] [address], [place], [place] [address]     Radiology Studies: [facility] Chest Port 1 View  Result Date: 06/03/2020 CLINICAL DATA:  Poor p.o. intake and hypokalemia EXAM: PORTABLE CHEST 1 VIEW COMPARISON:   06/02/2020, 02/21/2019 FINDINGS: Post sternotomy changes. Ascending thoracic stimulator leads. No focal opacity or pleural effusion. Normal cardiomediastinal silhouette. No pneumothorax. IMPRESSION: No active disease. Electronically Signed   By: Donavan Foil M.D.   On: 06/03/2020 18:23     Taye T. [place]  If 7PM-7AM, please contact night-coverage [email or web address] 06/04/2020, 2:24 PM

## 2020-06-04 NOTE — ED Notes (Signed)
Report called for pt transfer to the floor

## 2020-06-05 DIAGNOSIS — R531 Weakness: Secondary | ICD-10-CM

## 2020-06-05 DIAGNOSIS — R5381 Other malaise: Secondary | ICD-10-CM

## 2020-06-05 DIAGNOSIS — K5000 Crohn's disease of small intestine without complications: Secondary | ICD-10-CM

## 2020-06-05 DIAGNOSIS — R110 Nausea: Secondary | ICD-10-CM

## 2020-06-05 LAB — RENAL FUNCTION PANEL
Albumin: 2.8 g/dL — ABNORMAL LOW (ref 3.5–5.0)
Anion gap: 11 (ref 5–15)
BUN: 6 mg/dL (ref 6–20)
CO2: 25 mmol/L (ref 22–32)
Calcium: 7.8 mg/dL — ABNORMAL LOW (ref 8.9–10.3)
Chloride: 104 mmol/L (ref 98–111)
Creatinine, Ser: 1.17 mg/dL — ABNORMAL HIGH (ref 0.44–1.00)
GFR, Estimated: 54 mL/min — ABNORMAL LOW (ref >60)
Glucose, Bld: 120 mg/dL — ABNORMAL HIGH (ref 70–99)
Phosphorus: 2.3 mg/dL — ABNORMAL LOW (ref 2.5–4.6)
Potassium: 2.9 mmol/L — ABNORMAL LOW (ref 3.5–5.1)
Sodium: 140 mmol/L (ref 135–145)

## 2020-06-05 LAB — MAGNESIUM: Magnesium: 1.9 mg/dL (ref 1.7–2.4)

## 2020-06-05 LAB — CBC
HCT: 25.6 % — ABNORMAL LOW (ref 36.0–46.0)
Hemoglobin: 8.4 g/dL — ABNORMAL LOW (ref 12.0–15.0)
MCH: 32.4 pg (ref 26.0–34.0)
MCHC: 32.8 g/dL (ref 30.0–36.0)
MCV: 98.8 fL (ref 80.0–100.0)
Platelets: 241 10*3/uL (ref 150–400)
RBC: 2.59 MIL/uL — ABNORMAL LOW (ref 3.87–5.11)
RDW: 15 % (ref 11.5–15.5)
WBC: 8.3 10*3/uL (ref 4.0–10.5)
nRBC: 0 % (ref 0.0–0.2)

## 2020-06-05 LAB — C DIFFICILE QUICK SCREEN W PCR REFLEX
C Diff antigen: NEGATIVE
C Diff interpretation: NOT DETECTED
C Diff toxin: NEGATIVE

## 2020-06-05 MED ORDER — POTASSIUM CHLORIDE 2 MEQ/ML IV SOLN: INTRAVENOUS | Status: DC

## 2020-06-05 MED ORDER — LOPERAMIDE HCL 2 MG PO CAPS: 2.0000 mg | ORAL_CAPSULE | ORAL | Status: DC | PRN

## 2020-06-05 MED ORDER — CHOLESTYRAMINE LIGHT 4 G PO PACK
4.0000 g | PACK | Freq: Two times a day (BID) | ORAL | Status: DC
  Administered 2020-06-05: 4 g via ORAL
  Filled 2020-06-05 (×3): qty 1

## 2020-06-05 MED ORDER — CHOLESTYRAMINE 4 G PO PACK
4.0000 g | PACK | Freq: Two times a day (BID) | ORAL | Status: DC
  Filled 2020-06-05: qty 1

## 2020-06-05 MED ORDER — POTASSIUM CHLORIDE IN NACL 20-0.45 MEQ/L-% IV SOLN
INTRAVENOUS | Status: DC
  Filled 2020-06-05 (×4): qty 1000

## 2020-06-05 MED ORDER — POTASSIUM CHLORIDE CRYS ER 20 MEQ PO TBCR
40.0000 meq | EXTENDED_RELEASE_TABLET | ORAL | Status: AC
Start: 2020-06-05 — End: 2020-06-05
  Administered 2020-06-05 (×2): 40 meq via ORAL
  Filled 2020-06-05 (×2): qty 2

## 2020-06-05 NOTE — Progress Notes (Signed)
Nif -60 FVC 1.3 Liters great effort.

## 2020-06-05 NOTE — Progress Notes (Signed)
PROGRESS NOTE  Teresa Lee DQQ:[id number] DOB: [birth year]   PCP: [name], MD  Patient is from: Home.  Lives with her husband.  Uses walker at baseline.  DOA: 06/03/2020 LOS: 2  Chief complaints: Nausea and diarrhea  Brief Narrative / Interim history: 60 year old F with PMH of Crohn's disease, myasthenia gravis, recent  back surgery, CKD-3, anemia, HTN, HLD and recent hospitalization from 3/5-3/11 for AKI in the setting of nausea, vomiting and diarrhea returning with complaints of nausea and vomiting.  C. Difficile, GI panel, EGD and colo without significant finding to explain patient's GI symptoms. Patient was discharged on loperamide for outpatient follow-up with GI.   Patient was seen in ED on 3/16.  Basic labs, CT abdomen and pelvis, CT lumbar spine, CT head and CT cervical spine without acute finding.  She was discharged home from the ED.   In the ED, febrile to 101.2, tachycardic and slightly tachypneic.  BP slightly elevated.  Significant labs include K2.9, bicarb 20, Cr 1.13 (1.79 on 3/11), WBC 16 with left shift, Hgb 10.4 (8.4 on 3/10), lactic acid 2.3 but improved to 1.2 with IVF.  Cultures obtained.  Started on IV Zosyn, and admitted for possible sepsis with possible intra-abdominal infection vs viral infection.  Started on IV Zosyn.  Stool study pending.  Subjective: Seen and examined earlier this morning.  No major events overnight or this morning.  Complains weakness, nausea and diarrhea but last bowel movement was about 9 PM last night but she says she hasn't eaten since then.  Denies chest pain, dyspnea, abdominal pain, melena or hematochezia.  Denies UTI symptoms.  Objective: Vitals:   06/04/20 1802 06/04/20 2203 06/05/20 0127 06/05/20 0447  BP: 119/68 111/65 107/66 124/67  Pulse: 97 93 90 95  Resp: 16  16 18   Temp: 99.3 F (37.4 C) 99.3 F (37.4 C) 99 F (37.2 C) 99.6 F (37.6 C)  TempSrc: Oral Oral Oral Oral  SpO2: 94% (!) 89% 96% 98%  Weight:       Height:        Intake/Output Summary (Last 24 hours) at 06/05/2020 1147 Last data filed at 06/05/2020 0311 Gross per 24 hour  Intake 2081.98 ml  Output 4 ml  Net 2077.98 ml   Filed Weights   06/04/20 1447  Weight: 67 kg    Examination:  GENERAL: No apparent distress.  Nontoxic. HEENT: MMM.  Vision and hearing grossly intact.  NECK: Supple.  No apparent JVD.  RESP: On RA.  No IWOB.  Fair aeration bilaterally. CVS:  RRR. Heart sounds normal.  ABD/GI/GU: BS+. Abd soft, NTND.  MSK/EXT:  Moves extremities. No apparent deformity. No edema.  SKIN: Surgical wound over her back appears clean and dry NEURO: Awake, alert and oriented appropriately.  No apparent focal neuro deficit. PSYCH: Calm. Normal affect.   Procedures:  None  Microbiology summarized: Influenza and COVID-19 PCR nonreactive. C. difficile and GIP pending. Blood cultures pending. Urine culture with multiple species.  Assessment & Plan: Nausea/vomiting/diarrhea-ongoing issue for about 3 weeks now.  Admitted for the same 3/5-12/11.  C. difficile, GIB, EGD and colonoscopy unrevealing at that time.    She was discharged on Imodium to follow-up with GI outpatient. She has no gallbladder. Patient has fever with leukocytosis but no significant abdominal pain or tenderness to suggest infectious or inflammatory process.  Procalcitonin basically negative.  CT a/p on 3/16 without significant finding.  Diarrhea seems to have subsided.  No BM for about 12 hours now. -  Discussed with on-call GI from [facility], Dr. Brahmbhatt-recommended cholestyramine if GI panel negative -Will start Imodium and cholestyramine if negative -Continue IV fluid hydration and electrolyte replenishment -Continue IV Zosyn for now pending cultures and stool results -Antiemetics as needed-prefers Phenergan. -Follow stool chemistry -Follow GI recommendations  SIRS: Unclear source of infection.  diarrhea seems to be functional vs. infectious process.  Patient  has recent back surgery but CT lumbar spine without significant finding.  Surgical wound looks clean and dry.  Leukocytosis improved.  Lactic acidosis resolved.  There could be some element of hemoconcentration from dehydration but she had fever on presentation.  -Continue IV Zosyn pending cultures  Hypokalemia/hypomagnesemia: Likely due to GI loss.  K2.9. -K-Dur 40 mEq x 2 -Change LR to NS-KCl  Myasthenia gravis-no signs of exacerbation.  NIF -60.  FVC 1.3 L.  Mestinon recently discontinued due to diarrhea. -Continue home prednisone and CellCept  CKD-3A: Cr better than recent values Recent Labs    05/23/20 0247 05/24/20 0802 05/25/20 0357 05/26/20 0442 05/27/20 0223 05/28/20 0307 06/02/20 1959 06/03/20 1312 06/04/20 0029 06/04/20 0626 06/05/20 0521  BUN 67* 43* 31* 25* 25* 23* 19 12  --  8 6  CREATININE 4.16* 3.06* 2.55* 2.16* 2.14* 1.79* 1.15* 1.13* 0.90 1.04* 1.17*  -Continue monitoring. -IV fluid as above  History of lumbar stenosis s/p PLIF by Dr. [name].  CT lumbar spine on 3/16 reassuring.  Wound healing nicely. -Outpatient follow-up with neurosurgery.  Debility/physical deconditioning-uses walker at baseline.  Lives with husband who is having valve replacement surgery -PT/OT eval  Body mass index is 28.83 kg/m.         DVT prophylaxis:  enoxaparin (LOVENOX) injection 40 mg Start: 06/04/20 1000  Code Status: Full code Family Communication: Patient and/or RN. Available if any question.  Level of care: Progressive Status is: Inpatient  Remains inpatient appropriate because:Persistent severe electrolyte disturbances, Ongoing diagnostic testing needed not appropriate for outpatient work up, IV treatments appropriate due to intensity of illness or inability to take PO and Inpatient level of care appropriate due to severity of illness   Dispo: The patient is from: Home              Anticipated d/c is to: Home              Patient currently is not medically stable  to d/c.   Difficult to place patient No       Consultants:  Discussed with GI.   Sch Meds:  Scheduled Meds: . ALPRAZolam  0.5 mg Oral QHS  . cyanocobalamin  1,000 mcg Intramuscular Q30 days  . enoxaparin (LOVENOX) injection  40 mg Subcutaneous Q24H  . fluticasone furoate-vilanterol  1 puff Inhalation Daily  . Mesalamine  400 mg Oral BID  . mycophenolate  1,000 mg Oral BID  . potassium chloride  40 mEq Oral Q4H  . predniSONE  10 mg Oral Q breakfast  . rOPINIRole  1 mg Oral QHS  . rosuvastatin  40 mg Oral Daily   Continuous Infusions: . 0.45 % NaCl with KCl 20 mEq / L 125 mL/hr at 06/05/20 0827  . piperacillin-tazobactam (ZOSYN)  IV 3.375 g (06/05/20 0355)  . promethazine (PHENERGAN) injection 12.5 mg (06/05/20 0830)   PRN Meds:.acetaminophen **OR** acetaminophen, HYDROcodone-acetaminophen, promethazine (PHENERGAN) injection, rOPINIRole **AND** rOPINIRole  Antimicrobials: Anti-infectives (From admission, onward)   Start     Dose/Rate Route Frequency Ordered Stop   06/04/20 1200  piperacillin-tazobactam (ZOSYN) IVPB 3.375 g  3.375 g 12.5 mL/hr over 240 Minutes Intravenous Every 8 hours 06/04/20 1056     06/03/20 1700  piperacillin-tazobactam (ZOSYN) IVPB 3.375 g        3.375 g 100 mL/hr over 30 Minutes Intravenous  Once 06/03/20 1645 06/03/20 2009       I have personally reviewed the following labs and images: CBC: Recent Labs  Lab 06/02/20 1959 06/03/20 1312 06/04/20 0029 06/04/20 0626 06/05/20 0521  WBC 15.7* 16.9* 11.1* 10.8* 8.3  NEUTROABS 11.3*  --   --  7.0  --   HGB 10.4* 10.5* 8.2* 8.3* 8.4*  HCT 32.2* 31.0* 25.0* 25.1* 25.6*  MCV 98.2 94.2 96.9 96.5 98.8  PLT 286 320 232 229 241   BMP &GFR Recent Labs  Lab 06/02/20 1959 06/03/20 1312 06/03/20 1652 06/04/20 0029 06/04/20 0626 06/05/20 0521  NA 141 139  --   --  139 140  K 2.9* 2.9*  --   --  3.0* 2.9*  CL 104 106  --   --  105 104  CO2 20* 18*  --   --  23 25  GLUCOSE 112* 115*  --    --  94 120*  BUN 19 12  --   --  8 6  CREATININE 1.15* 1.13*  --  0.90 1.04* 1.17*  CALCIUM 7.6* 8.1*  --   --  7.4* 7.8*  MG  --   --  1.2*  --  1.5* 1.9  PHOS  --   --   --   --   --  2.3*   Estimated Creatinine Clearance: 44.8 mL/min (A) (by C-G formula based on SCr of 1.17 mg/dL (H)). Liver & Pancreas: Recent Labs  Lab 06/02/20 1959 06/03/20 1312 06/04/20 0626 06/05/20 0521  AST 36 28 18  --   ALT 29 28 20   --   ALKPHOS 85 96 76  --   BILITOT 0.5 0.8 0.7  --   PROT 7.0 7.3 5.6*  --   ALBUMIN 3.6 4.0 2.9* 2.8*   Recent Labs  Lab 06/02/20 1959 06/03/20 1312  LIPASE 40 36   No results for input(s): AMMONIA in the last 168 hours. Diabetic: No results for input(s): HGBA1C in the last 72 hours. No results for input(s): GLUCAP in the last 168 hours. Cardiac Enzymes: No results for input(s): CKTOTAL, CKMB, CKMBINDEX, TROPONINI in the last 168 hours. No results for input(s): PROBNP in the last 8760 hours. Coagulation Profile: Recent Labs  Lab 06/03/20 1638  INR 1.1   Thyroid Function Tests: No results for input(s): TSH, T4TOTAL, FREET4, T3FREE, THYROIDAB in the last 72 hours. Lipid Profile: No results for input(s): CHOL, HDL, LDLCALC, TRIG, CHOLHDL, LDLDIRECT in the last 72 hours. Anemia Panel: No results for input(s): VITAMINB12, FOLATE, FERRITIN, TIBC, IRON, RETICCTPCT in the last 72 hours. Urine analysis:    Component Value Date/Time   COLORURINE YELLOW 06/03/2020 2030   APPEARANCEUR HAZY (A) 06/03/2020 2030   LABSPEC 1.033 (H) 06/03/2020 2030   PHURINE 5.0 06/03/2020 2030   GLUCOSEU NEGATIVE 06/03/2020 2030   HGBUR NEGATIVE 06/03/2020 2030   [place] NEGATIVE 06/03/2020 2030   KETONESUR 5 (A) 06/03/2020 2030   PROTEINUR >=300 (A) 06/03/2020 2030   NITRITE NEGATIVE 06/03/2020 2030   LEUKOCYTESUR NEGATIVE 06/03/2020 2030   Sepsis Labs: Invalid input(s): PROCALCITONIN, [place]  Microbiology: Recent Results (from the past 240 hour(s))  Resp Panel  by RT-PCR (Flu A&B, Covid) Nasopharyngeal Swab     Status: None  Collection Time: 06/03/20  4:38 PM   Specimen: Nasopharyngeal Swab; Nasopharyngeal(NP) swabs in vial transport medium  Result Value Ref Range Status   SARS Coronavirus 2 by RT PCR NEGATIVE NEGATIVE Final    Comment: (NOTE) SARS-CoV-2 target nucleic acids are NOT DETECTED.  The SARS-CoV-2 RNA is generally detectable in upper respiratory specimens during the acute phase of infection. The lowest concentration of SARS-CoV-2 viral copies this assay can detect is 138 copies/mL. A negative result does not preclude SARS-Cov-2 infection and should not be used as the sole basis for treatment or other patient management decisions. A negative result may occur with  improper specimen collection/handling, submission of specimen other than nasopharyngeal swab, presence of viral mutation(s) within the areas targeted by this assay, and inadequate number of viral copies(<138 copies/mL). A negative result must be combined with clinical observations, patient history, and epidemiological information. The expected result is Negative.  Fact Sheet for Patients:  EntrepreneurPulse.com.au  Fact Sheet for Healthcare Providers:  IncredibleEmployment.be  This test is no t yet approved or cleared by the [place] FDA and  has been authorized for detection and/or diagnosis of SARS-CoV-2 by FDA under an Emergency Use Authorization (EUA). This EUA will remain  in effect (meaning this test can be used) for the duration of the COVID-19 declaration under Section 564(b)(1) of the Act, 21 U.S.C.section 360bbb-3(b)(1), unless the authorization is terminated  or revoked sooner.       Influenza A by PCR NEGATIVE NEGATIVE Final   Influenza B by PCR NEGATIVE NEGATIVE Final    Comment: (NOTE) The Xpert Xpress SARS-CoV-2/FLU/RSV plus assay is intended as an aid in the diagnosis of influenza from Nasopharyngeal swab  specimens and should not be used as a sole basis for treatment. Nasal washings and aspirates are unacceptable for Xpert Xpress SARS-CoV-2/FLU/RSV testing.  Fact Sheet for Patients: EntrepreneurPulse.com.au  Fact Sheet for Healthcare Providers: IncredibleEmployment.be  This test is not yet approved or cleared by the [place] FDA and has been authorized for detection and/or diagnosis of SARS-CoV-2 by FDA under an Emergency Use Authorization (EUA). This EUA will remain in effect (meaning this test can be used) for the duration of the COVID-19 declaration under Section 564(b)(1) of the Act, 21 U.S.C. section 360bbb-3(b)(1), unless the authorization is terminated or revoked.  Performed at [facility], [place] [address], [place], [place] [address]   Urine culture     Status: Abnormal (Preliminary result)   Collection Time: 06/03/20  8:30 PM   Specimen: In/Out Cath Urine  Result Value Ref Range Status   Specimen Description   Final    IN/OUT CATH URINE Performed at [facility] [address] [place], [place] [address]    Special Requests   Final    NONE Performed at [facility], [place] [address], [place], [place] [address]    Culture MULTIPLE SPECIES PRESENT, SUGGEST RECOLLECTION (A)  Final   Report Status PENDING  Incomplete    Radiology Studies: No results found.   Taye T. Langley  If 7PM-7AM, please contact night-coverage [email or web address] 06/05/2020, 11:47 AM

## 2020-06-05 NOTE — Progress Notes (Signed)
Spoke with Dr. Cyndia Skeeters regarding a midline order he placed. Received MD order to place a PIV until a qualified IVT member is on campus to place. PIV inserted.

## 2020-06-05 NOTE — Progress Notes (Signed)
NIF -36 VC 2.8L

## 2020-06-06 DIAGNOSIS — E349 Endocrine disorder, unspecified: Secondary | ICD-10-CM

## 2020-06-06 DIAGNOSIS — R809 Proteinuria, unspecified: Secondary | ICD-10-CM

## 2020-06-06 LAB — GASTROINTESTINAL PANEL BY PCR, STOOL (REPLACES STOOL CULTURE)

## 2020-06-06 LAB — HEMOGLOBIN AND HEMATOCRIT, BLOOD
HCT: 23.9 % — ABNORMAL LOW (ref 36.0–46.0)
Hemoglobin: 7.6 g/dL — ABNORMAL LOW (ref 12.0–15.0)

## 2020-06-06 LAB — RENAL FUNCTION PANEL
Albumin: 2.8 g/dL — ABNORMAL LOW (ref 3.5–5.0)
Anion gap: 9 (ref 5–15)
BUN: 6 mg/dL (ref 6–20)
CO2: 24 mmol/L (ref 22–32)
Calcium: 8.1 mg/dL — ABNORMAL LOW (ref 8.9–10.3)
Chloride: 108 mmol/L (ref 98–111)
Creatinine, Ser: 1.23 mg/dL — ABNORMAL HIGH (ref 0.44–1.00)
GFR, Estimated: 51 mL/min — ABNORMAL LOW (ref >60)
Glucose, Bld: 120 mg/dL — ABNORMAL HIGH (ref 70–99)
Phosphorus: 2 mg/dL — ABNORMAL LOW (ref 2.5–4.6)
Potassium: 4.1 mmol/L (ref 3.5–5.1)
Sodium: 141 mmol/L (ref 135–145)

## 2020-06-06 LAB — HCG, QUANTITATIVE, PREGNANCY: hCG, Beta Chain, Quant, S: 1 m[IU]/mL (ref <5)

## 2020-06-06 LAB — MAGNESIUM: Magnesium: 1.7 mg/dL (ref 1.7–2.4)

## 2020-06-06 MED ORDER — CHOLESTYRAMINE LIGHT 4 G PO PACK
4.0000 g | PACK | Freq: Three times a day (TID) | ORAL | Status: DC
  Filled 2020-06-06 (×4): qty 1

## 2020-06-06 MED ORDER — PANTOPRAZOLE SODIUM 40 MG PO TBEC
40.0000 mg | DELAYED_RELEASE_TABLET | Freq: Every day | ORAL | Status: DC
Start: 2020-06-06 — End: 2020-06-07
  Administered 2020-06-06 – 2020-06-07 (×2): 40 mg via ORAL
  Filled 2020-06-06: qty 1

## 2020-06-06 MED ORDER — AMOXICILLIN-POT CLAVULANATE 875-125 MG PO TABS
1.0000 | ORAL_TABLET | Freq: Two times a day (BID) | ORAL | Status: DC
  Administered 2020-06-06 – 2020-06-07 (×3): 1 via ORAL
  Filled 2020-06-06 (×3): qty 1

## 2020-06-06 MED ORDER — MAGNESIUM SULFATE 4 GM/100ML IV SOLN
4.0000 g | Freq: Once | INTRAVENOUS | Status: AC
  Administered 2020-06-06: 4 g via INTRAVENOUS
  Filled 2020-06-06: qty 100

## 2020-06-06 MED ORDER — SODIUM PHOSPHATES 45 MMOLE/15ML IV SOLN
20.0000 mmol | Freq: Once | INTRAVENOUS | Status: AC
  Administered 2020-06-06: 20 mmol via INTRAVENOUS
  Filled 2020-06-06: qty 6.67

## 2020-06-06 MED ORDER — ALUM & MAG HYDROXIDE-SIMETH 200-200-20 MG/5ML PO SUSP
30.0000 mL | Freq: Once | ORAL | Status: AC
  Administered 2020-06-06: 30 mL via ORAL
  Filled 2020-06-06: qty 30

## 2020-06-06 NOTE — Evaluation (Signed)
Physical Therapy Evaluation Patient Details Name: Teresa Lee MRN: [id number] DOB: [birth year] Today's Date: 06/06/2020   History of Present Illness  Pt admitted with N/V and diarrhea x 3 wks and dx with SIRS, sepsis, hypokalemia, and hypermagnesimia.  Pt with hx of Myasthenia Gravis, Fibromyalgia, Spinal cord stimulator, DJD, L eye blindness, Chrohns Dz, and recent back surgery - PLIF 05/04/20  Clinical Impression  Pt admitted as above and presenting with functional mobility limitations 2* ongoing back pain, nausea and balance deficits.  Pt should progress to dc home with family assist.    Follow Up Recommendations No PT follow up    Equipment Recommendations  None recommended by PT    Recommendations for Other Services       Precautions / Restrictions Precautions Precautions: Fall Precaution Comments: Pt placed in brace following PLIF 05/04/20 but is unclear on if she is still to be wearing it.  Brace is not present with pt Restrictions Weight Bearing Restrictions: No      Mobility  Bed Mobility Overal bed mobility: Needs Assistance Bed Mobility: Supine to Sit;Sit to Supine     Supine to sit: Supervision Sit to supine: Supervision   General bed mobility comments: Increased time with min cues for complete log roll    Transfers Overall transfer level: Needs assistance Equipment used: Rolling walker (2 wheeled) Transfers: Sit to/from Stand Sit to Stand: Min guard         General transfer comment: steady assist with cues for use of UEs to self assist  Ambulation/Gait Ambulation/Gait assistance: Min guard Gait Distance (Feet): 250 Feet (and 15' twice to/from bathroom) Assistive device: Rolling walker (2 wheeled) Gait Pattern/deviations: Step-through pattern;Shuffle;Trunk flexed Gait velocity: mod pace   General Gait Details: cues for posture and position from [employer]            Wheelchair Mobility    Modified Rankin (Stroke Patients Only)        Balance Overall balance assessment: Needs assistance Sitting-balance support: No upper extremity supported;Feet supported Sitting balance-Leahy Scale: Fair     Standing balance support: No upper extremity supported Standing balance-Leahy Scale: Fair                               Pertinent Vitals/Pain Pain Assessment: 0-10 Pain Score: 5  Pain Location: back pain Pain Descriptors / Indicators: Sore Pain Intervention(s): Limited activity within patient's tolerance;Monitored during session;Patient requesting pain meds-RN notified    Home Living Family/patient expects to be discharged to:: Private residence Living Arrangements: Spouse/significant other Available Help at Discharge: Family Type of Home: House Home Access: Stairs to enter Entrance Stairs-Rails: None Entrance Stairs-Number of Steps: 2 Home Layout: Able to live on main level with bedroom/bathroom;Two level Home Equipment: Cane - single point;Bedside commode;Walker - 2 wheels      Prior Function Level of Independence: Needs assistance   Gait / Transfers Assistance Needed: use of RW as needed since back surgery  ADL's / Homemaking Assistance Needed: Assist of spouse        Hand Dominance   Dominant Hand: Right    Extremity/Trunk Assessment   Upper Extremity Assessment Upper Extremity Assessment: Overall WFL for tasks assessed    Lower Extremity Assessment Lower Extremity Assessment: Overall WFL for tasks assessed       Communication   Communication: No difficulties  Cognition Arousal/Alertness: Awake/alert Behavior During Therapy: WFL for tasks assessed/performed Overall Cognitive Status: Within Functional Limits for  tasks assessed                                        General Comments      Exercises     Assessment/Plan    PT Assessment Patient needs continued PT services  PT Problem List Decreased strength;Decreased range of motion;Decreased activity  tolerance;Decreased balance;Decreased mobility;Decreased knowledge of use of DME;Pain       PT Treatment Interventions DME instruction;Gait training;Stair training;Functional mobility training;Therapeutic activities;Therapeutic exercise;Patient/family education;Balance training    PT Goals (Current goals can be found in the Care Plan section)  Acute Rehab PT Goals Patient Stated Goal: Get better and go home PT Goal Formulation: With patient Time For Goal Achievement: 06/20/20 Potential to Achieve Goals: Good    Frequency Min 3X/week   Barriers to discharge        Co-evaluation               AM-PAC PT "6 Clicks" Mobility  Outcome Measure Help needed turning from your back to your side while in a flat bed without using bedrails?: A Little Help needed moving from lying on your back to sitting on the side of a flat bed without using bedrails?: A Little Help needed moving to and from a bed to a chair (including a wheelchair)?: A Little Help needed standing up from a chair using your arms (e.g., wheelchair or bedside chair)?: A Little Help needed to walk in hospital room?: A Little Help needed climbing 3-5 steps with a railing? : A Little 6 Click Score: 18    End of Session Equipment Utilized During Treatment: Gait belt Activity Tolerance: Patient tolerated treatment well Patient left: in bed;with call bell/phone within reach;with bed alarm set Nurse Communication: Mobility status PT Visit Diagnosis: Difficulty in walking, not elsewhere classified (R26.2)    Time: 0569-7948 PT Time Calculation (min) (ACUTE ONLY): 23 min   Charges:   PT Evaluation $PT Eval Low Complexity: 1 Low PT Treatments $Gait Training: 8-22 mins        [place] Pager [phone] Office [phone]   Morgen Ritacco 06/06/2020, 11:46 AM

## 2020-06-06 NOTE — Progress Notes (Signed)
PROGRESS NOTE  Khamari Yousuf FUX:[id number] DOB: 1960-09-18   PCP: [name], MD  Patient is from: Home.  Lives with her husband.  Uses walker at baseline.  DOA: 06/03/2020 LOS: 3  Chief complaints: Nausea and diarrhea  Brief Narrative / Interim history: 60 year old F with PMH of Crohn's disease, myasthenia gravis, recent  back surgery, CKD-3, anemia, cholecystectomy, HTN, HLD and recent hospitalization from 3/5-3/11 for AKI in the setting of nausea, vomiting and diarrhea returning with complaints of nausea and vomiting.  C. Difficile, GI panel, EGD and colo without significant finding to explain patient's GI symptoms. Patient was discharged on loperamide for outpatient follow-up with GI.   Patient was seen in ED on 3/16.  Basic labs, CT abdomen and pelvis, CT lumbar spine, CT head and CT cervical spine without acute finding.  She was discharged home from the ED.   Patient was admitted for nausea, diarrhea, SIRS and electrolyte abnormalities and a started on IV fluid and IV Zosyn.  COVID-19 and influenza PCR nonreactive.  Blood culture NGTD.  Urine culture with multiple species.  C. difficile negative.  GI panel pending.  Antibiotics de-escalating to p.o. Augmentin.  Started on Imodium and cholestyramine after discussion with GI.Marland Kitchen    Subjective: Seen and examined earlier this morning.  Continues to report weakness, lightheadedness, nausea and diarrhea.  She also reports heartburn.  She has 2 BMs charted in the last 24 hours but RN reported ongoing diarrhea.  She denies chest pain or dyspnea.  Denies abdominal pain, melena or hematochezia.  Objective: Vitals:   06/05/20 0447 06/05/20 1453 06/05/20 2009 06/06/20 0411  BP: 124/67 131/80 119/71 122/65  Pulse: 95 (!) 101 (!) 101 81  Resp: 18 16 20 16   Temp: 99.6 F (37.6 C) 99.1 F (37.3 C) 99.5 F (37.5 C) 98.4 F (36.9 C)  TempSrc: Oral Oral Oral Oral  SpO2: 98% 97% 99% 98%  Weight:      Height:        Intake/Output  Summary (Last 24 hours) at 06/06/2020 1144 Last data filed at 06/06/2020 0359 Gross per 24 hour  Intake 1933.55 ml  Output -  Net 1933.55 ml   Filed Weights   06/04/20 1447  Weight: 67 kg    Examination:   GENERAL: No apparent distress.  Nontoxic. HEENT: MMM.  Vision and hearing grossly intact.  NECK: Supple.  No apparent JVD.  RESP: On RA.  No IWOB.  Fair aeration bilaterally. CVS:  RRR. Heart sounds normal.  ABD/GI/GU: BS+. Abd soft, NTND.  MSK/EXT:  Moves extremities. No apparent deformity. No edema.  SKIN: Surgical wound over her back healing and clean. NEURO: Awake, alert and oriented appropriately.  No apparent focal neuro deficit. PSYCH: Calm. Normal affect.   Procedures:  None  Microbiology summarized: Influenza and COVID-19 PCR nonreactive. C. difficile negative. Blood cultures NGTD Urine culture with multiple species. GIP pending  Assessment & Plan: Nausea/vomiting/diarrhea-ongoing issue for about 3 weeks now.  Admitted for the same 3/5-12/11.  C. difficile, GIB, EGD and colonoscopy unrevealing at that time.  She was discharged on Imodium to follow-up with GI. She has no gallbladder.  Febrile with leukocytosis on admission but no significant abdominal pain or tenderness to suggest infectious or inflammatory process.  Procalcitonin basically negative.  CT a/p on 3/16 without significant finding.  Seems to have ongoing diarrhea.  Infectious work-up unrevealing so far. -Started on Imodium and cholestyramine on 3/19 after discussion with GI, Dr. Alessandra Bevels -Continue IV fluid hydration and  electrolyte replenishment -IV Zosyn 3/17-3/19>> p.o. Augmentin 3/20>> for 3 more days -Antiemetics as needed-prefers Phenergan. -Follow stool chemistry, fecal elastase and ova and parasite. Off note she uses well water  SIRS: Unclear source of infection.  diarrhea seems to be functional vs. infectious process.  Patient has recent back surgery but CT lumbar spine without significant  finding.  Surgical wound looks clean and dry.  Leukocytosis improved.  Lactic acidosis resolved.  There could be some element of hemoconcentration from dehydration but she had fever on presentation.  -Continue IV Zosyn pending cultures  Hypokalemia/hypomagnesemia/hypophosphatemia: Hypokalemia resolved. -Replenish phosphorus and magnesium  Myasthenia gravis-no signs of exacerbation.  NIF -60.  FVC 1.6 L.  Mestinon recently d/cd due to diarrhea. -Continue home prednisone and CellCept.  No signs of adrenal insufficiency  CKD-3A: Cr better than recent values.  She has > 300 in urine Recent Labs    05/24/20 0802 05/25/20 0357 05/26/20 0442 05/27/20 0223 05/28/20 0307 06/02/20 1959 06/03/20 1312 06/04/20 0029 06/04/20 0626 06/05/20 0521 06/06/20 0556  BUN 43* 31* 25* 25* 23* 19 12  --  8 6 6   CREATININE 3.06* 2.55* 2.16* 2.14* 1.79* 1.15* 1.13* 0.90 1.04* 1.17* 1.23*  -Check UPC -IV fluid as above  History of lumbar stenosis s/p PLIF by Dr. [name].  CT lumbar spine on 3/16 reassuring.  Wound healing. -Outpatient follow-up with neurosurgery.  Elevated hCG-7.6>>1.  Unclear significance but resolved.   Debility/physical deconditioning-uses walker at baseline.  Lives with husband who is having valve replacement surgery -PT/OT eval   Body mass index is 28.83 kg/m.         DVT prophylaxis:  enoxaparin (LOVENOX) injection 40 mg Start: 06/04/20 1000  Code Status: Full code Family Communication: Patient and/or RN. Available if any question.  Level of care: Progressive Status is: Inpatient  Remains inpatient appropriate because:Persistent severe electrolyte disturbances, Ongoing diagnostic testing needed not appropriate for outpatient work up, IV treatments appropriate due to intensity of illness or inability to take PO and Inpatient level of care appropriate due to severity of illness   Dispo: The patient is from: Home              Anticipated d/c is to: Home               Patient currently is not medically stable to d/c.   Difficult to place patient No       Consultants:  Discussed with GI.   Sch Meds:  Scheduled Meds: . ALPRAZolam  0.5 mg Oral QHS  . amoxicillin-clavulanate  1 tablet Oral Q12H  . cholestyramine light  4 g Oral BID  . cyanocobalamin  1,000 mcg Intramuscular Q30 days  . enoxaparin (LOVENOX) injection  40 mg Subcutaneous Q24H  . fluticasone furoate-vilanterol  1 puff Inhalation Daily  . Mesalamine  400 mg Oral BID  . mycophenolate  1,000 mg Oral BID  . pantoprazole  40 mg Oral Daily  . predniSONE  10 mg Oral Q breakfast  . rOPINIRole  1 mg Oral QHS  . rosuvastatin  40 mg Oral Daily   Continuous Infusions: . 0.45 % NaCl with KCl 20 mEq / L 125 mL/hr at 06/06/20 1004  . magnesium sulfate bolus IVPB 4 g (06/06/20 1120)  . promethazine (PHENERGAN) injection Stopped (06/05/20 2248)  . sodium phosphate  Dextrose 5% IVPB     PRN Meds:.acetaminophen **OR** acetaminophen, HYDROcodone-acetaminophen, loperamide, promethazine (PHENERGAN) injection, rOPINIRole **AND** rOPINIRole  Antimicrobials: Anti-infectives (From admission, onward)   Start  Dose/Rate Route Frequency Ordered Stop   06/06/20 1000  amoxicillin-clavulanate (AUGMENTIN) 875-125 MG per tablet 1 tablet        1 tablet Oral Every 12 hours 06/06/20 0717 06/09/20 0959   06/04/20 1200  piperacillin-tazobactam (ZOSYN) IVPB 3.375 g  Status:  Discontinued        3.375 g 12.5 mL/hr over 240 Minutes Intravenous Every 8 hours 06/04/20 1056 06/06/20 0717   06/03/20 1700  piperacillin-tazobactam (ZOSYN) IVPB 3.375 g        3.375 g 100 mL/hr over 30 Minutes Intravenous  Once 06/03/20 1645 06/03/20 2009       I have personally reviewed the following labs and images: CBC: Recent Labs  Lab 06/02/20 1959 06/03/20 1312 06/04/20 0029 06/04/20 0626 06/05/20 0521 06/06/20 0556  WBC 15.7* 16.9* 11.1* 10.8* 8.3  --   NEUTROABS 11.3*  --   --  7.0  --   --   HGB 10.4* 10.5* 8.2*  8.3* 8.4* 7.6*  HCT 32.2* 31.0* 25.0* 25.1* 25.6* 23.9*  MCV 98.2 94.2 96.9 96.5 98.8  --   PLT 286 320 232 229 241  --    BMP &GFR Recent Labs  Lab 06/02/20 1959 06/03/20 1312 06/03/20 1652 06/04/20 0029 06/04/20 0626 06/05/20 0521 06/06/20 0556  NA 141 139  --   --  139 140 141  K 2.9* 2.9*  --   --  3.0* 2.9* 4.1  CL 104 106  --   --  105 104 108  CO2 20* 18*  --   --  23 25 24   GLUCOSE 112* 115*  --   --  94 120* 120*  BUN 19 12  --   --  8 6 6   CREATININE 1.15* 1.13*  --  0.90 1.04* 1.17* 1.23*  CALCIUM 7.6* 8.1*  --   --  7.4* 7.8* 8.1*  MG  --   --  1.2*  --  1.5* 1.9 1.7  PHOS  --   --   --   --   --  2.3* 2.0*   Estimated Creatinine Clearance: 42.6 mL/min (A) (by C-G formula based on SCr of 1.23 mg/dL (H)). Liver & Pancreas: Recent Labs  Lab 06/02/20 1959 06/03/20 1312 06/04/20 0626 06/05/20 0521 06/06/20 0556  AST 36 28 18  --   --   ALT 29 28 20   --   --   ALKPHOS 85 96 76  --   --   BILITOT 0.5 0.8 0.7  --   --   PROT 7.0 7.3 5.6*  --   --   ALBUMIN 3.6 4.0 2.9* 2.8* 2.8*   Recent Labs  Lab 06/02/20 1959 06/03/20 1312  LIPASE 40 36   No results for input(s): AMMONIA in the last 168 hours. Diabetic: No results for input(s): HGBA1C in the last 72 hours. No results for input(s): GLUCAP in the last 168 hours. Cardiac Enzymes: No results for input(s): CKTOTAL, CKMB, CKMBINDEX, TROPONINI in the last 168 hours. No results for input(s): PROBNP in the last 8760 hours. Coagulation Profile: Recent Labs  Lab 06/03/20 1638  INR 1.1   Thyroid Function Tests: No results for input(s): TSH, T4TOTAL, FREET4, T3FREE, THYROIDAB in the last 72 hours. Lipid Profile: No results for input(s): CHOL, HDL, LDLCALC, TRIG, CHOLHDL, LDLDIRECT in the last 72 hours. Anemia Panel: No results for input(s): VITAMINB12, FOLATE, FERRITIN, TIBC, IRON, RETICCTPCT in the last 72 hours. Urine analysis:    Component Value Date/Time   COLORURINE YELLOW  06/03/2020 2030    APPEARANCEUR HAZY (A) 06/03/2020 2030   LABSPEC 1.033 (H) 06/03/2020 2030   PHURINE 5.0 06/03/2020 2030   GLUCOSEU NEGATIVE 06/03/2020 2030   HGBUR NEGATIVE 06/03/2020 2030   [place] NEGATIVE 06/03/2020 2030   KETONESUR 5 (A) 06/03/2020 2030   PROTEINUR >=300 (A) 06/03/2020 2030   NITRITE NEGATIVE 06/03/2020 2030   LEUKOCYTESUR NEGATIVE 06/03/2020 2030   Sepsis Labs: Invalid input(s): PROCALCITONIN, [place]  Microbiology: Recent Results (from the past 240 hour(s))  Resp Panel by RT-PCR (Flu A&B, Covid) Nasopharyngeal Swab     Status: None   Collection Time: 06/03/20  4:38 PM   Specimen: Nasopharyngeal Swab; Nasopharyngeal(NP) swabs in vial transport medium  Result Value Ref Range Status   SARS Coronavirus 2 by RT PCR NEGATIVE NEGATIVE Final    Comment: (NOTE) SARS-CoV-2 target nucleic acids are NOT DETECTED.  The SARS-CoV-2 RNA is generally detectable in upper respiratory specimens during the acute phase of infection. The lowest concentration of SARS-CoV-2 viral copies this assay can detect is 138 copies/mL. A negative result does not preclude SARS-Cov-2 infection and should not be used as the sole basis for treatment or other patient management decisions. A negative result may occur with  improper specimen collection/handling, submission of specimen other than nasopharyngeal swab, presence of viral mutation(s) within the areas targeted by this assay, and inadequate number of viral copies(<138 copies/mL). A negative result must be combined with clinical observations, patient history, and epidemiological information. The expected result is Negative.  Fact Sheet for Patients:  EntrepreneurPulse.com.au  Fact Sheet for Healthcare Providers:  IncredibleEmployment.be  This test is no t yet approved or cleared by the [place] FDA and  has been authorized for detection and/or diagnosis of SARS-CoV-2 by FDA under an Emergency Use  Authorization (EUA). This EUA will remain  in effect (meaning this test can be used) for the duration of the COVID-19 declaration under Section 564(b)(1) of the Act, 21 U.S.C.section 360bbb-3(b)(1), unless the authorization is terminated  or revoked sooner.       Influenza A by PCR NEGATIVE NEGATIVE Final   Influenza B by PCR NEGATIVE NEGATIVE Final    Comment: (NOTE) The Xpert Xpress SARS-CoV-2/FLU/RSV plus assay is intended as an aid in the diagnosis of influenza from Nasopharyngeal swab specimens and should not be used as a sole basis for treatment. Nasal washings and aspirates are unacceptable for Xpert Xpress SARS-CoV-2/FLU/RSV testing.  Fact Sheet for Patients: EntrepreneurPulse.com.au  Fact Sheet for Healthcare Providers: IncredibleEmployment.be  This test is not yet approved or cleared by the [place] FDA and has been authorized for detection and/or diagnosis of SARS-CoV-2 by FDA under an Emergency Use Authorization (EUA). This EUA will remain in effect (meaning this test can be used) for the duration of the COVID-19 declaration under Section 564(b)(1) of the Act, 21 U.S.C. section 360bbb-3(b)(1), unless the authorization is terminated or revoked.  Performed at [facility], [place] [address] [place], [place] [address]   Blood Culture (routine x 2)     Status: None (Preliminary result)   Collection Time: 06/03/20  5:36 PM   Specimen: BLOOD  Result Value Ref Range Status   Specimen Description   Final    BLOOD RIGHT ANTECUBITAL Performed at [place] [address], [place], [place] [address]    Special Requests   Final    BOTTLES DRAWN AEROBIC AND ANAEROBIC Blood Culture adequate volume Performed at [place] [address] [place], [name]  [address]    Culture   Final    NO GROWTH 1 DAY Performed at [facility], [place] [address] [place], [place]  [address]    Report Status PENDING  Incomplete  Blood Culture (routine x 2)     Status: None (Preliminary result)   Collection Time: 06/03/20  5:36 PM   Specimen: BLOOD  Result Value Ref Range Status   Specimen Description   Final    BLOOD LEFT ARM UPPER Performed at [place] [address], [place], [place] [address]    Special Requests   Final    BOTTLES DRAWN AEROBIC AND ANAEROBIC Blood Culture adequate volume Performed at [facility] [address], [place], [place] [address]    Culture   Final    NO GROWTH 1 DAY Performed at [facility], [place] [address] [place], [place] [address]    Report Status PENDING  Incomplete  Urine culture     Status: Abnormal (Preliminary result)   Collection Time: 06/03/20  8:30 PM   Specimen: In/Out Cath Urine  Result Value Ref Range Status   Specimen Description   Final    IN/OUT CATH URINE Performed at [facility] [address], [place], [place] [address]    Special Requests   Final    NONE Performed at [facility], [place] [address], [place], [place] [address]    Culture MULTIPLE SPECIES PRESENT, SUGGEST RECOLLECTION (A)  Final   Report Status PENDING  Incomplete  C Difficile Quick Screen w PCR reflex     Status: None   Collection Time: 06/05/20  1:45 PM  Result Value Ref Range Status   C Diff antigen NEGATIVE NEGATIVE Final   C Diff toxin NEGATIVE NEGATIVE Final   C Diff interpretation No C. difficile detected.  Final    Comment: Performed at [facility], [place] [address] [place], [place] [address]    Radiology Studies: No results found.   Taye T. [place]  If 7PM-7AM, please contact night-coverage [email or web address] 06/06/2020, 11:44 AM

## 2020-06-06 NOTE — Progress Notes (Signed)
NIF -60  FVC 1.6L

## 2020-06-06 NOTE — Progress Notes (Signed)
NIF -40 VC 2.2L

## 2020-06-07 DIAGNOSIS — K909 Intestinal malabsorption, unspecified: Secondary | ICD-10-CM

## 2020-06-07 DIAGNOSIS — R112 Nausea with vomiting, unspecified: Secondary | ICD-10-CM

## 2020-06-07 LAB — RENAL FUNCTION PANEL
Albumin: 2.8 g/dL — ABNORMAL LOW (ref 3.5–5.0)
Anion gap: 10 (ref 5–15)
BUN: 6 mg/dL (ref 6–20)
CO2: 23 mmol/L (ref 22–32)
Calcium: 8.2 mg/dL — ABNORMAL LOW (ref 8.9–10.3)
Chloride: 107 mmol/L (ref 98–111)
Creatinine, Ser: 1.01 mg/dL — ABNORMAL HIGH (ref 0.44–1.00)
GFR, Estimated: >60 mL/min (ref >60)
Glucose, Bld: 103 mg/dL — ABNORMAL HIGH (ref 70–99)
Phosphorus: 3.1 mg/dL (ref 2.5–4.6)
Potassium: 4 mmol/L (ref 3.5–5.1)
Sodium: 140 mmol/L (ref 135–145)

## 2020-06-07 LAB — URINE CULTURE

## 2020-06-07 LAB — CBC
HCT: 27.4 % — ABNORMAL LOW (ref 36.0–46.0)
Hemoglobin: 8.6 g/dL — ABNORMAL LOW (ref 12.0–15.0)
MCH: 31.5 pg (ref 26.0–34.0)
MCHC: 31.4 g/dL (ref 30.0–36.0)
MCV: 100.4 fL — ABNORMAL HIGH (ref 80.0–100.0)
Platelets: 259 10*3/uL (ref 150–400)
RBC: 2.73 MIL/uL — ABNORMAL LOW (ref 3.87–5.11)
RDW: 14.9 % (ref 11.5–15.5)
WBC: 9.9 10*3/uL (ref 4.0–10.5)
nRBC: 0.3 % — ABNORMAL HIGH (ref 0.0–0.2)

## 2020-06-07 LAB — IRON AND TIBC
Iron: 86 ug/dL (ref 28–170)
Saturation Ratios: 30 % (ref 10.4–31.8)
TIBC: 283 ug/dL (ref 250–450)
UIBC: 197 ug/dL

## 2020-06-07 LAB — MAGNESIUM: Magnesium: 2.1 mg/dL (ref 1.7–2.4)

## 2020-06-07 LAB — VITAMIN B12: Vitamin B-12: 924 pg/mL — ABNORMAL HIGH (ref 180–914)

## 2020-06-07 LAB — FERRITIN: Ferritin: 211 ng/mL (ref 11–307)

## 2020-06-07 LAB — FOLATE: Folate: 5.8 ng/mL — ABNORMAL LOW (ref >5.9)

## 2020-06-07 MED ORDER — CHOLESTYRAMINE LIGHT 4 G PO PACK: 4.0000 g | PACK | Freq: Three times a day (TID) | ORAL | 1 refills | Status: DC

## 2020-06-07 MED ORDER — AMOXICILLIN-POT CLAVULANATE 875-125 MG PO TABS: 1.0000 | ORAL_TABLET | Freq: Two times a day (BID) | ORAL | 0 refills | Status: DC

## 2020-06-07 MED ORDER — FOLIC ACID 1 MG PO TABS: 1.0000 mg | ORAL_TABLET | Freq: Every day | ORAL | 3 refills | Status: AC

## 2020-06-07 MED ORDER — PROMETHEGAN 25 MG RE SUPP: 25.0000 mg | Freq: Three times a day (TID) | RECTAL | 0 refills | Status: DC | PRN

## 2020-06-07 NOTE — Progress Notes (Signed)
NIF -40 VC 2.0L

## 2020-06-07 NOTE — Care Management Important Message (Signed)
Important Message  Patient Details IM Letter given to the Patient. Name: Ima Hafner MRN: [id number] Date of Birth: October 13, 1960   Medicare Important Message Given:  Yes     Kerin Salen 06/07/2020, 12:55 PM

## 2020-06-07 NOTE — Discharge Instructions (Signed)
Gallbladder Eating Plan If you have a gallbladder condition, you may have trouble digesting fats. Eating a low-fat diet can help reduce your symptoms, and may be helpful before and after having surgery to remove your gallbladder (cholecystectomy). Your health care provider may recommend that you work with a diet and nutrition specialist (dietitian) to help you reduce the amount of fat in your diet. What are tips for following this plan? General guidelines  Limit your fat intake to less than 30% of your total daily calories. If you eat around 1,800 calories each day, this is less than 60 grams (g) of fat per day.  Fat is an important part of a healthy diet. Eating a low-fat diet can make it hard to maintain a healthy body weight. Ask your dietitian how much fat, calories, and other nutrients you need each day.  Eat small, frequent meals throughout the day instead of three large meals.  Drink at least 8-10 cups of fluid a day. Drink enough fluid to keep your urine clear or pale yellow.  Limit alcohol intake to no more than 1 drink a day for nonpregnant women and 2 drinks a day for men. One drink equals 12 oz of beer, 5 oz of wine, or 1 oz of hard liquor. Reading food labels  Check Nutrition Facts on food labels for the amount of fat per serving. Choose foods with less than 3 grams of fat per serving.   Shopping  Choose nonfat and low-fat healthy foods. Look for the words "nonfat," "low fat," or "fat free."  Avoid buying processed or prepackaged foods. Cooking  Cook using low-fat methods, such as baking, broiling, grilling, or boiling.  Cook with small amounts of healthy fats, such as olive oil, grapeseed oil, canola oil, or sunflower oil. What foods are recommended?  All fresh, frozen, or canned fruits and vegetables.  Whole grains.  Low-fat or non-fat (skim) milk and yogurt.  Lean meat, skinless poultry, fish, eggs, and beans.  Low-fat protein supplement powders or  drinks.  Spices and herbs. What foods are not recommended?  High-fat foods. These include baked goods, fast food, fatty cuts of meat, ice cream, french toast, sweet rolls, pizza, cheese bread, foods covered with butter, creamy sauces, or cheese.  Fried foods. These include french fries, tempura, battered fish, breaded chicken, fried breads, and sweets.  Foods with strong odors.  Foods that cause bloating and gas. Summary  A low-fat diet can be helpful if you have a gallbladder condition, or before and after gallbladder surgery.  Limit your fat intake to less than 30% of your total daily calories. This is about 60 g of fat if you eat 1,800 calories each day.  Eat small, frequent meals throughout the day instead of three large meals. This information is not intended to replace advice given to you by your health care provider. Make sure you discuss any questions you have with your health care provider. Document Revised: 10/23/2019 Document Reviewed: 10/23/2019 Elsevier Patient Education  2021 [employer].

## 2020-06-07 NOTE — Evaluation (Signed)
Occupational Therapy Evaluation Patient Details Name: Teresa Lee MRN: [id number] DOB: 08-06-1960 Today's Date: 06/07/2020    History of Present Illness 60 y.o. female presenting with continued N/V x3 weeks. Patient admitted with SIRS, sepsis, hypokalemia, and hypermagnesimia. Patient with recent hospital admission for acute renal failure with presistent N/V s/p EDG and colonoscopy. PMHx significant for Crohn's disease, myasthenia gravis, anemia and recent lumbar surgery 04/2020.   Clinical Impression   Patient is very familiar to this therapist from previous admission for PLIF at [facility] in 04/2020. PTA patient was living with her spouse in a private residence and was I with ADLs/IADLs at baseline. Patient demonstrates bed mobility, grooming tasks, standing at sink level, 3/3 toileting tasks and LB dressing with Mod I this date without AD. Patient does not require continued acute occupational therapy services with OT to sign off at this time. Patient would benefit from clarification on wearing schedule for aspen lumbar brace. No brace present in room this date.     Follow Up Recommendations  No OT follow up;Supervision - Intermittent    Equipment Recommendations  None recommended by OT    Recommendations for Other Services       Precautions / Restrictions Precautions Precautions: Fall Precaution Comments: Aspen lumbar brace s/p PLIF 04/2020. No brace in room at time of evaluation. Restrictions Weight Bearing Restrictions: No      Mobility Bed Mobility Overal bed mobility: Modified Independent Bed Mobility: Supine to Sit           General bed mobility comments: HOB elevated with slightly increased time. Does not require external assist.    Transfers Overall transfer level: Needs assistance   Transfers: Sit to/from Stand Sit to Stand: Modified independent (Device/Increase time)         General transfer comment: Sit to stand x3 from various surfaces with Mod I without  AD.    Balance Overall balance assessment: Needs assistance Sitting-balance support: No upper extremity supported;Feet supported Sitting balance-Leahy Scale: Good     Standing balance support: No upper extremity supported Standing balance-Leahy Scale: Fair Standing balance comment: Able to complete 2/3 grooming tasks standing at sink level without UE support.                           ADL either performed or assessed with clinical judgement   ADL Overall ADL's : Modified independent                                       General ADL Comments: Completes grooming standing at sink, 3/3 parts of toileting, and LB dressing with Mod I without AD. Requires increased time.     Vision         Perception     Praxis      Pertinent Vitals/Pain Pain Assessment: 0-10 Pain Score: 5  Pain Location: back pain Pain Descriptors / Indicators: Sore Pain Intervention(s): Limited activity within patient's tolerance;Monitored during session;Repositioned     Hand Dominance Right   Extremity/Trunk Assessment Upper Extremity Assessment Upper Extremity Assessment: Overall WFL for tasks assessed   Lower Extremity Assessment Lower Extremity Assessment: Overall WFL for tasks assessed   Cervical / Trunk Assessment Cervical / Trunk Assessment: Other exceptions Cervical / Trunk Exceptions: s/p lumbar surgery 04/2020   Communication Communication Communication: No difficulties   Cognition Arousal/Alertness: Awake/alert Behavior During Therapy: [facility] for tasks assessed/performed  Overall Cognitive Status: Within Functional Limits for tasks assessed                                     General Comments       Exercises     Shoulder Instructions      Home Living Family/patient expects to be discharged to:: Private residence Living Arrangements: Spouse/significant other Available Help at Discharge: Family Type of Home: House Home Access: Stairs to  enter Technical brewer of Steps: 2 Entrance Stairs-Rails: None Home Layout: Able to live on main level with bedroom/bathroom;Two level     Bathroom Shower/Tub: Teacher, early years/pre: Standard Bathroom Accessibility: Yes   Home Equipment: Cane - single point;Bedside commode;Walker - 2 wheels          Prior Functioning/Environment Level of Independence: Needs assistance  Gait / Transfers Assistance Needed: use of RW as needed since back surgery ADL's / Homemaking Assistance Needed: Assist of spouse            OT Problem List: Pain      OT Treatment/Interventions:      OT Goals(Current goals can be found in the care plan section) Acute Rehab OT Goals Patient Stated Goal: Get better and go home OT Goal Formulation: With patient  OT Frequency:     Barriers to D/C:            Co-evaluation              AM-PAC OT "6 Clicks" Daily Activity     Outcome Measure Help from another person eating meals?: None Help from another person taking care of personal grooming?: None Help from another person toileting, which includes using toliet, bedpan, or urinal?: None Help from another person bathing (including washing, rinsing, drying)?: A Little Help from another person to put on and taking off regular upper body clothing?: None Help from another person to put on and taking off regular lower body clothing?: None 6 Click Score: 23   End of Session Nurse Communication: Mobility status  Activity Tolerance: Patient tolerated treatment well Patient left: in chair;with call bell/phone within reach  Pain - part of body:  (Low back)                Time: 2500-3704 OT Time Calculation (min): 14 min Charges:  OT General Charges $OT Visit: 1 Visit OT Evaluation $OT Eval Low Complexity: 1 Low  Joella Saefong H. OTR/L Supplemental OT, Department of rehab services [phone]  Elai Vanwyk R H. 06/07/2020, 9:46 AM

## 2020-06-07 NOTE — Discharge Summary (Signed)
Physician Discharge Summary  Teresa Lee WNI:[id number] DOB: 1960-11-19 DOA: 06/03/2020  PCP: [name], MD  Admit date: 06/03/2020 Discharge date: 06/07/2020  Admitted From: Home Disposition: Home  Recommendations for Outpatient Follow-up:  1. Follow up with PCP as below 2. Also recommended outpatient follow-up with GI in 2-3 weeks 3. Please obtain CBC/CMP/Mag/UA at follow up 4. Please follow up on the following pending results: stool chemistry, fecal elastase and ova and parasite.  Home Health: None required Equipment/Devices: None required  Discharge Condition: Stable CODE STATUS: Full code   Follow-up Information    [name], MD. Schedule an appointment as soon as possible for a visit in 1 week(s).   Specialty: Family Medicine Contact information: [name] [address] [phone]                Hospital Course: 60 year old F with PMH of Crohn's disease, myasthenia gravis, recent  back surgery, CKD-3, anemia, cholecystectomy, HTN, HLD and recent hospitalization from 3/5-3/11 for AKI in the setting of nausea, vomiting and diarrhea returning with complaints of nausea and vomiting.  C. Difficile, GI panel, EGD and colo without significant finding to explain patient's GI symptoms. Patient was discharged on loperamide for outpatient follow-up with GI.   Patient was seen in ED on 3/16.  Basic labs, CT abdomen and pelvis, CT lumbar spine, CT head and CT cervical spine without acute finding.  She was discharged home from the ED.   Patient was admitted for nausea, diarrhea, SIRS and electrolyte abnormalities and a started on IV fluid and IV Zosyn.  COVID-19 and influenza PCR nonreactive.  Blood culture NGTD.  Urine culture with multiple species. C. difficile and GI panel negative.  Antibiotics de-escalating to p.o. Augmentin.  Started on Imodium and cholestyramine after discussion with GI.  Nausea and diarrhea improved.  She was  discharged on p.o. Augmentin for 2 more days, and cholestyramine 4 g 3 times daily and as needed Imodium.   Evaluated by therapy and no liters identified.  See individual problem list below for more hospital course.  Discharge Diagnoses:  Nausea/vomiting/diarrhea-  Admitted for the same 3/5-12/11.  C. difficile, GIB, EGD and colonoscopy unrevealing at that time.  She was discharged on Imodium to follow-up with GI. She has no gallbladder. Febrile with leukocytosis on admission but no significant abdominal pain or tenderness to suggest infectious or inflammatory process.  Procalcitonin basically negative.  CT a/p on 3/16 without significant finding.  Blood culture, urine culture, C. difficile and GI panel negative. Started on Imodium and cholestyramine on 3/19 after discussion with GI, Dr. Alessandra Bevels, and diarrhea improved.  Emesis resolved.  Nausea improved. -IV Zosyn 3/17-3/19>> p.o. Augmentin 3/20-3/22 -Continue cholestyramine 4 g 3 times daily, and Imodium as needed -Continue Phenergan as needed -Counseled on low-fat diet, and provided with written information on this. -Follow stool chemistry, fecal elastase and ova and parasite. Off note she uses well water  SIRS: Unclear source of infection.  diarrhea seems to be functional vs. infectious process.  Patient has recent back surgery but CT lumbar spine without significant finding.  Surgical wound looks clean and dry.  Leukocytosis improved.  Lactic acidosis resolved.  Infectious work-up unrevealing. There could be some element of hemoconcentration from dehydration but she had fever on presentation.  -Antibiotics as above.  Hypokalemia/hypomagnesemia/hypophosphatemia: Likely due to GI loss.  Resolved  Myasthenia gravis- Mestinon recently d/cd due to diarrhea. -Continue home prednisone and CellCept.  No signs MG exacerbation or adrenal  insufficiency   CKD-3A: Cr better than recent values.  She has > 300 in urine but urine was contaminated  with fecal matter Recent Labs    05/25/20 0357 05/26/20 0442 05/27/20 0223 05/28/20 0307 06/02/20 1959 06/03/20 1312 06/04/20 0029 06/04/20 0626 06/05/20 0521 06/06/20 0556 06/07/20 0535  BUN 31* 25* 25* 23* 19 12  --  8 6 6 6   CREATININE 2.55* 2.16* 2.14* 1.79* 1.15* 1.13* 0.90 1.04* 1.17* 1.23* 1.01*  -Recommend repeat urinalysis and considering UPC if needed  History of lumbar stenosis s/p PLIF by Dr. [name].  CT lumbar spine on 3/16 reassuring.  Wound healing. -Outpatient follow-up with neurosurgery.  Elevated hCG-7.6>>1.  Unclear significance but resolved.   Generalized weakness: Likely due to the above. Uses walker at baseline.  Improved.  Evaluated by therapy and no needles identified.  Overweight Body mass index is 28.83 kg/m.  -Encourage lifestyle change to lose weight  Family communication: The above findings and plans discussed with patient and patient's husband at bedside          Discharge Exam: Vitals:   06/07/20 0441 06/07/20 0812  BP: 136/84   Pulse: 79   Resp: 16   Temp: 98.6 F (37 C)   SpO2: 100% 98%    GENERAL: No apparent distress.  Nontoxic. HEENT: MMM.  Vision and hearing grossly intact.  NECK: Supple.  No apparent JVD.  RESP: On RA.  No IWOB.  Fair aeration bilaterally. CVS:  RRR. Heart sounds normal.  ABD/GI/GU: Bowel sounds present. Soft. Non tender.  MSK/EXT:  Moves extremities. No apparent deformity. No edema.  SKIN: no apparent skin lesion or wound NEURO: Awake, alert and oriented appropriately.  No apparent focal neuro deficit. PSYCH: Calm. Normal affect.  Discharge Instructions  Discharge Instructions    Call MD for:  extreme fatigue   Complete by: As directed    Call MD for:  persistant dizziness or light-headedness   Complete by: As directed    Call MD for:  persistant nausea and vomiting   Complete by: As directed    Call MD for:  severe uncontrolled pain   Complete by: As directed    Call MD for:  temperature  >100.4   Complete by: As directed    Diet general   Complete by: As directed    Low-fat diet   Discharge instructions   Complete by: As directed    It has been a pleasure taking care of you!  You were hospitalized due to nausea and vomiting. The cause is not quite clear but could be related to absence of your gallbladder.  We have started you on Imodium and cholestyramine, and your diarrhea improved.  You may continue taking Phenergan as needed for nausea. Off note, pain medication such as Norco or oxycodone could cause nausea and vomiting.  You may have to continue holding your Mestinon until your diarrhea resolves.  We have also treated you with antibiotic for possible infection although the test is we have done so far did not show clear source of infection.  We recommend low-fat diet.  See a separate discharge instruction for low-fat diet.  Please follow-up with your primary care doctor in 1 to 2 weeks, and your respiratory neurologist in 2 to 3 weeks.   Take care,   Increase activity slowly   Complete by: As directed    No wound care   Complete by: As directed      Allergies as of 06/07/2020  Reactions   Duloxetine Nausea Only   Latex Other (See Comments)   BLISTER   Lorazepam Other (See Comments)   Hallucinations   Orange Concentrate [flavoring Agent] Other (See Comments)   Acid reflux   Other Hives, Nausea Only, Other (See Comments)   Some nuts cause a rash   Percocet [oxycodone-acetaminophen] Hives   Sulfa Antibiotics Hives   Bioflavonoids Rash   Causes burning of skin and blisters   Cefixime Other (See Comments)   Due to myasthenia gravis   Nucynta [tapentadol] Other (See Comments)   Headache       Medication List    STOP taking these medications   magnesium oxide 400 MG tablet Commonly known as: MAG-OX     TAKE these medications   AeroChamber Plus inhaler Use as instructed to use with inahaler.   albuterol 108 (90 Base) MCG/ACT inhaler Commonly known  as: VENTOLIN HFA 1 PUFF EVERY 6 HOURS AS NEEDED FOR WHEEZING What changed: See the new instructions.   albuterol (2.5 MG/3ML) 0.083% nebulizer solution Commonly known as: PROVENTIL Take 2.5 mg by nebulization every 6 (six) hours as needed for shortness of breath. What changed: Another medication with the same name was changed. Make sure you understand how and when to take each.   ALPRAZolam 0.5 MG tablet Commonly known as: XANAX Take 0.5 mg by mouth at bedtime.   amoxicillin-clavulanate 875-125 MG tablet Commonly known as: AUGMENTIN Take 1 tablet by mouth every 12 (twelve) hours.   CALCIUM 500 PO Take 1 tablet by mouth daily.   cholestyramine light 4 g packet Commonly known as: PREVALITE Take 1 packet (4 g total) by mouth 3 (three) times daily.   cyanocobalamin 1000 MCG/ML injection Commonly known as: (VITAMIN B-12) Inject 1,000 mcg into the muscle every 30 (thirty) days.   estradiol 2 MG tablet Commonly known as: ESTRACE Take 2 mg by mouth at bedtime.   folic acid 1 MG tablet Commonly known as: FOLVITE Take 1 tablet (1 mg total) by mouth daily.   HYDROcodone-acetaminophen 10-325 MG tablet Commonly known as: NORCO Take 1 tablet by mouth 5 (five) times daily as needed for moderate pain.   loperamide 2 MG capsule Commonly known as: IMODIUM Take 2 capsules (4 mg total) by mouth as needed for diarrhea or loose stools.   Mesalamine 400 MG Cpdr DR capsule Commonly known as: ASACOL Take 400 mg by mouth 2 (two) times daily.   methocarbamol 750 MG tablet Commonly known as: ROBAXIN Take 750 mg by mouth at bedtime.   mupirocin ointment 2 % Commonly known as: BACTROBAN Apply 1 application topically 2 (two) times daily.   mycophenolate 500 MG tablet Commonly known as: CELLCEPT Take 1,000 mg by mouth 2 (two) times daily.   naloxone 4 MG/0.1ML Liqd nasal spray kit Commonly known as: NARCAN Place 1 spray into the nose once as needed (opioid overdose).   pantoprazole 40  MG tablet Commonly known as: PROTONIX TAKE 1 TABLET DAILY What changed: when to take this   Potassium Chloride ER 20 MEQ Tbcr Take 20 mEq by mouth daily for 5 days.   predniSONE 10 MG tablet Commonly known as: DELTASONE Take 10 mg by mouth every morning.   progesterone 100 MG capsule Commonly known as: PROMETRIUM Take 100 mg by mouth at bedtime.   promethazine 25 MG tablet Commonly known as: PHENERGAN Take 25 mg by mouth every 6 (six) hours as needed for nausea. What changed: Another medication with the same name was changed. Make  sure you understand how and when to take each.   Promethegan 25 MG suppository Generic drug: promethazine Place 1 suppository (25 mg total) rectally every 8 (eight) hours as needed for nausea or vomiting. What changed:   when to take this  reasons to take this   pyridostigmine 60 MG tablet Commonly known as: MESTINON Take 30 mg by mouth daily.   rOPINIRole 1 MG tablet Commonly known as: REQUIP Take 1-2 tablets (1-2 mg total) by mouth at bedtime. What changed:   how much to take  when to take this  additional instructions   rosuvastatin 40 MG tablet Commonly known as: Crestor Take 1 tablet (40 mg total) by mouth daily.   Symbicort 80-4.5 MCG/ACT inhaler Generic drug: budesonide-formoterol Inhale 2 puffs into the lungs 2 (two) times daily. What changed:   when to take this  reasons to take this       Consultations:  Gastroenterology  Procedures/Studies:  None   CT ABDOMEN PELVIS WO CONTRAST  Result Date: 05/22/2020 CLINICAL DATA:  60 year old female with abdominal and pelvic pain with nausea and vomiting for 2 weeks. History of lumbar surgery on 05/04/2020. History of inflammatory bowel disease. EXAM: CT ABDOMEN AND PELVIS WITHOUT CONTRAST TECHNIQUE: Multidetector CT imaging of the abdomen and pelvis was performed following the standard protocol without IV contrast. COMPARISON:  09/05/2017 CT FINDINGS: Please note that  parenchymal abnormalities may be missed without intravenous contrast. Lower chest: No acute abnormality Hepatobiliary: Hepatic steatosis again identified without focal hepatic abnormality. The patient is status post cholecystectomy. No biliary dilatation. Pancreas: Unremarkable Spleen: Unremarkable Adrenals/Urinary Tract: The kidneys, adrenal glands and bladder are unremarkable. There is no evidence of hydronephrosis or urinary calculi. Stomach/Bowel: Stomach is within normal limits. Appendix appears normal. No evidence of bowel wall thickening, distention, or inflammatory changes. Vascular/Lymphatic: Aortic atherosclerosis. No enlarged abdominal or pelvic lymph nodes. Reproductive: There is heterogeneity of the central uterus noted which may represent endometrial thickening/mass. The adnexal regions are unremarkable. Other: No ascites, focal collection or pneumoperitoneum. Musculoskeletal: Posterior fusion changes at L4-L5 noted. A thoracic cord stimulator is identified. No acute or suspicious bony findings are noted. IMPRESSION: 1. No evidence of acute abnormality. No bowel abnormalities identified. 2. Heterogeneity of the central uterus which may represent endometrial thickening/mass. Pelvic ultrasound is recommended for further evaluation. 3. Hepatic steatosis. 4. Aortic Atherosclerosis (ICD10-I70.0). Electronically Signed   By: Margarette Canada M.D.   On: 05/22/2020 10:13   DG Chest 2 View  Result Date: 06/02/2020 CLINICAL DATA:  Fall in the shower.  Chest pain. EXAM: CHEST - 2 VIEW COMPARISON:  02/21/2019 FINDINGS: Post median sternotomy. Lower most sternal wire is broken, unchanged. The heart is normal in size. No pneumothorax or pleural effusion. No focal airspace disease. Spinal stimulator is in place. No acute osseous abnormalities are seen. IMPRESSION: No acute findings or traumatic injury to the thorax. Electronically Signed   By: Keith Rake M.D.   On: 06/02/2020 19:53   CT Head Wo  Contrast  Result Date: 06/02/2020 CLINICAL DATA:  [place] and hit head abrasion and bruising EXAM: CT HEAD WITHOUT CONTRAST CT CERVICAL SPINE WITHOUT CONTRAST TECHNIQUE: Multidetector CT imaging of the head and cervical spine was performed following the standard protocol without intravenous contrast. Multiplanar CT image reconstructions of the cervical spine were also generated. COMPARISON:  CT 09/06/2017 FINDINGS: CT HEAD FINDINGS Brain: No acute territorial infarction, hemorrhage or intracranial mass is visualized. Absence of the septum pellucidum. 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 Stable ventricle size and morphology. Vascular: No hyperdense vessels.  Carotid vascular calcification Skull: Normal. Negative for fracture or focal lesion. Sinuses/Orbits: No acute finding. Other: None CT CERVICAL SPINE FINDINGS Alignment: Reversal of cervical lordosis. 3 mm anterolisthesis C3 on C4. Similar trace retrolisthesis C4 on C5. Facet alignment is maintained Skull base and vertebrae: No acute fracture. No primary bone lesion or focal pathologic process. Soft tissues and spinal canal: No prevertebral fluid or swelling. No visible canal hematoma. Disc levels: Advanced degenerative changes C4-C5, C5-C6 and C6-C7 with disc space narrowing and osteophyte. Facet degenerative changes at multiple levels. Upper chest: Negative. Other: None IMPRESSION: 1. No CT evidence for acute intracranial abnormality. 2. 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 3. Reversal of cervical lordosis with advanced degenerative changes. No fracture seen. Slight increased anterolisthesis C3 on C4 likely due to posterior facet degenerative change which has progressed. Electronically Signed   By: Donavan Foil M.D.   On: 06/02/2020  21:53   CT Cervical Spine Wo Contrast  Result Date: 06/02/2020 CLINICAL DATA:  [place] and hit head abrasion and bruising EXAM: CT HEAD WITHOUT CONTRAST CT CERVICAL SPINE WITHOUT CONTRAST TECHNIQUE: Multidetector CT imaging of the head and cervical spine was performed following the standard protocol without intravenous contrast. Multiplanar CT image reconstructions of the cervical spine were also generated. COMPARISON:  CT 09/06/2017 FINDINGS: CT HEAD FINDINGS Brain: No acute territorial infarction, hemorrhage or intracranial mass is visualized. Absence of the septum pellucidum. 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 Stable ventricle size and morphology. Vascular: No hyperdense vessels.  Carotid vascular calcification Skull: Normal. Negative for fracture or focal lesion. Sinuses/Orbits: No acute finding. Other: None CT CERVICAL SPINE FINDINGS Alignment: Reversal of cervical lordosis. 3 mm anterolisthesis C3 on C4. Similar trace retrolisthesis C4 on C5. Facet alignment is maintained Skull base and vertebrae: No acute fracture. No primary bone lesion or focal pathologic process. Soft tissues and spinal canal: No prevertebral fluid or swelling. No visible canal hematoma. Disc levels: Advanced degenerative changes C4-C5, C5-C6 and C6-C7 with disc space narrowing and osteophyte. Facet degenerative changes at multiple levels. Upper chest: Negative. Other: None IMPRESSION: 1. No CT evidence for acute intracranial abnormality. 2. 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 3. Reversal of cervical lordosis with advanced degenerative changes. No fracture seen. Slight increased anterolisthesis C3 on C4 likely due to posterior facet degenerative change which has progressed. Electronically Signed   By: Donavan Foil M.D.   On: 06/02/2020 21:53   CT Abdomen Pelvis W Contrast  Result Date: 06/02/2020 CLINICAL DATA:  Diarrhea with nausea and vomiting. Acute abdominal pain. Fall in the shower. EXAM: CT ABDOMEN AND PELVIS WITH CONTRAST TECHNIQUE: Multidetector CT imaging of the abdomen and pelvis was performed using the standard protocol following bolus administration of intravenous contrast. CONTRAST:  12m OMNIPAQUE IOHEXOL 300 MG/ML  SOLN COMPARISON:  Abdominal CT 11 days ago 05/22/2020 FINDINGS: Lower chest: Minor hypoventilatory atelectasis in the left greater than right lower lobe. Hepatobiliary: Diffusely decreased hepatic density typical of steatosis. No focal hepatic abnormality. Gallbladder physiologically distended, no calcified stone. No biliary dilatation. Pancreas: No ductal dilatation or inflammation.  Spleen: Normal in size without focal abnormality. Small splenule anteriorly near the pancreatic tail. Adrenals/Urinary Tract: Normal adrenal glands. No hydronephrosis or perinephric edema. Homogeneous renal enhancement with symmetric excretion on delayed phase imaging. Minimal cortical scarring in the upper left kidney. Urinary bladder is physiologically distended without wall thickening. Stomach/Bowel: The stomach is unremarkable. Normal positioning of the duodenum and ligament of Treitz. Occasional fluid-filled small bowel that is nonobstructed, inflamed or abnormally dilated. Normal appendix. Small volume of formed stool throughout the colon. No liquid stool. No colonic wall thickening. No pericolonic inflammation. Vascular/Lymphatic: Aortic atherosclerosis. No aneurysm. Patent portal vein. Retroaortic left renal vein. No abdominopelvic adenopathy. Reproductive: 11 mm left ovarian/adnexal cyst needs no further follow-up. Endometrium measures approximately 6 mm. Other: No free fluid or free air. Spinal stimulator with battery pack in the right, partially included. Musculoskeletal: Lumbar spine assessed on  concurrent lumbar spine reformats. Occasional bone islands in the pelvis. No acute pelvic fracture. IMPRESSION: 1. No acute abnormality or explanation for abdominal pain and diarrhea. 2. Mild endometrial thickening. Nonemergent pelvic ultrasound is again recommended for further evaluation on an elective basis. 3. Hepatic steatosis. Aortic Atherosclerosis (ICD10-I70.0). Electronically Signed   By: Keith Rake M.D.   On: 06/02/2020 21:50   CT L-SPINE NO CHARGE  Result Date: 06/02/2020 CLINICAL DATA:  Initial evaluation for acute trauma, fall, recent surgery. EXAM: CT LUMBAR SPINE WITHOUT CONTRAST TECHNIQUE: Multidetector CT imaging of the lumbar spine was performed without intravenous contrast administration. Multiplanar CT image reconstructions were also generated. COMPARISON:  Prior radiograph from 05/04/2020. FINDINGS: Segmentation: Standard. Lowest well-formed disc space labeled the L5-S1 level. Alignment: Trace 2 mm anterolisthesis of L4 on L5. Alignment otherwise normal preservation of the normal lumbar lordosis. Vertebrae: Vertebral body height maintained without acute or chronic fracture. Visualized sacrum and pelvis intact. SI joints approximated symmetric. No discrete or worrisome osseous lesions. Sequelae of recent PLIF at L4-5. Hardware is intact and well-positioned. No periprosthetic lucency to suggest lucency or failure. Paraspinal and other soft tissues: Normal expected postoperative changes present within the posterior paraspinous soft tissues. Paraspinous soft tissues demonstrate no other acute finding. Generator for spinal stimulator device present within the subcutaneous fat of the right flank. Electrodes seen coursing cephalad within the dorsal epidural space, entering the spinal canal at the level of T11-12. Visualized visceral structures within normal limits. Retro Dick left renal vein noted. Mild aortic atherosclerosis. Disc levels: L1-2:  Unremarkable. L2-3: Small biforaminal disc  protrusions, slightly larger on the right. No spinal stenosis. Foramina remain patent. L3-4: Minimal disc bulge, eccentric to the right. Superimposed right foraminal to extraforaminal disc protrusion with annular calcification (series 8, image 100). Mild facet hypertrophy. No significant spinal stenosis. Mild to moderate right with mild left L3 foraminal stenosis. L4-5: Recent PLIF. No appreciable residual spinal stenosis. Foramina appear grossly patent. L5-S1: Broad-based central to right subarticular disc protrusion with slight caudad angulation. Minimal facet hypertrophy. No significant spinal stenosis. Foramina remain patent. IMPRESSION: 1. No acute traumatic injury within the lumbar spine. 2. Sequelae of recent PLIF at Z9-9 without complication. No significant residual spinal stenosis. Foramina remain patent at this time. 3. Broad-based central to right subarticular disc protrusion at L5-S1, closely approximating the descending right S1 nerve root, similar to previous. 4. Right foraminal to extraforaminal disc protrusion at L3-4, potentially irritating the exiting right L3 nerve root. Electronically Signed   By: Jeannine Boga M.D.   On: 06/02/2020 22:48   DG Chest Port 1 View  Result Date: 06/03/2020 CLINICAL DATA:  Poor p.o. intake and hypokalemia EXAM: PORTABLE CHEST 1 VIEW COMPARISON:  06/02/2020, 02/21/2019 FINDINGS: Post sternotomy changes. Ascending thoracic stimulator leads. No focal opacity or pleural effusion. Normal cardiomediastinal silhouette. No pneumothorax. IMPRESSION: No active disease. Electronically Signed   By: Donavan Foil M.D.   On: 06/03/2020 18:23   US PELVIC COMPLETE WITH TRANSVAGINAL  Result Date: 05/28/2020 CLINICAL DATA:  Mass seen on CT EXAM: TRANSABDOMINAL AND TRANSVAGINAL ULTRASOUND OF PELVIS TECHNIQUE: Both transabdominal and transvaginal ultrasound examinations of the pelvis were performed. Transabdominal technique was performed for global imaging of the pelvis  including uterus, ovaries, adnexal regions, and pelvic cul-de-sac. It was necessary to proceed with endovaginal exam following the transabdominal exam to visualize the ovaries. COMPARISON:  CT dated May 22, 2020 FINDINGS: Uterus Measurements: 8.1 x 3.1 x 3.2 cm = volume: 42 mL. No fibroids or other mass visualized. Endometrium The endometrium is difficult to accurately measure but appears to measure at least 10 mm in thickness. Multiple cysts are noted within the endometrium. Right ovary Not visualized Left ovary Not visualized Other findings No abnormal free fluid. IMPRESSION: 1. Suboptimal exam secondary to bowel gas. The ovaries were not visualized. 2. The endometrium was difficult to evaluate but it appears to be thickened with multiple cysts. Findings may be suggestive of cystic endometrial hyperplasia. Endometrial thickness is considered abnormal for an asymptomatic post-menopausal female. Endometrial sampling should be considered to exclude carcinoma. Electronically Signed   By: Constance Holster M.D.   On: 05/28/2020 14:15   DG Hip Unilat W or Wo Pelvis 2-3 Views Right  Result Date: 06/02/2020 CLINICAL DATA:  Fall in the shower with right hip pain. EXAM: DG HIP (WITH OR WITHOUT PELVIS) 2-3V RIGHT COMPARISON:  Radiograph 07/14/2019 FINDINGS: The cortical margins of the bony pelvis and right hip are intact. No fracture. Pubic symphysis and sacroiliac joints are congruent. The pubic rami are intact. The femoral head is well seated in the acetabulum. Joint space is preserved. Surgical hardware in the lower lumbar spine partially included. Bone island in the right pelvis. IMPRESSION: No fracture of the pelvis or right hip. Electronically Signed   By: Keith Rake M.D.   On: 06/02/2020 19:55       The results of significant diagnostics from this hospitalization (including imaging, microbiology, ancillary and laboratory) are listed below for reference.     Microbiology: Recent Results (from the  past 240 hour(s))  Resp Panel by RT-PCR (Flu A&B, Covid) Nasopharyngeal Swab     Status: None   Collection Time: 06/03/20  4:38 PM   Specimen: Nasopharyngeal Swab; Nasopharyngeal(NP) swabs in vial transport medium  Result Value Ref Range Status   SARS Coronavirus 2 by RT PCR NEGATIVE NEGATIVE Final    Comment: (NOTE) SARS-CoV-2 target nucleic acids are NOT DETECTED.  The SARS-CoV-2 RNA is generally detectable in upper respiratory specimens during the acute phase of infection. The lowest concentration of SARS-CoV-2 viral copies this assay can detect is 138 copies/mL. A negative result does not preclude SARS-Cov-2 infection and should not be used as the sole basis for treatment or other patient management decisions. A negative result may occur with  improper specimen collection/handling, submission of specimen other than nasopharyngeal swab, presence of viral mutation(s) within the areas targeted by this assay, and inadequate number of viral copies(<138 copies/mL). A negative result must be combined with clinical observations, patient history, and epidemiological information. The expected result is Negative.  Fact Sheet for Patients:  EntrepreneurPulse.com.au  Fact Sheet for Healthcare Providers:  IncredibleEmployment.be  This test is no t yet approved or cleared by the [place] and  has been authorized for detection and/or diagnosis of SARS-CoV-2 by FDA under an Emergency Use Authorization (EUA). This EUA will remain  in effect (meaning this test can be used) for the duration of the COVID-19 declaration under Section 564(b)(1) of the Act, 21 U.S.C.section 360bbb-3(b)(1), unless the authorization is terminated  or revoked sooner.       Influenza A by PCR NEGATIVE NEGATIVE Final   Influenza B by PCR NEGATIVE NEGATIVE Final    Comment: (NOTE) The Xpert Xpress SARS-CoV-2/FLU/RSV plus assay is intended as an aid in the diagnosis of  influenza from Nasopharyngeal swab specimens and should not be used as a sole basis for treatment. Nasal washings and aspirates are unacceptable for Xpert Xpress SARS-CoV-2/FLU/RSV testing.  Fact Sheet for Patients: EntrepreneurPulse.com.au  Fact Sheet for Healthcare Providers: IncredibleEmployment.be  This test is not yet approved or cleared by the [place] FDA and has been authorized for detection and/or diagnosis of SARS-CoV-2 by FDA under an Emergency Use Authorization (EUA). This EUA will remain in effect (meaning this test can be used) for the duration of the COVID-19 declaration under Section 564(b)(1) of the Act, 21 U.S.C. section 360bbb-3(b)(1), unless the authorization is terminated or revoked.  Performed at [facility], [place] [address], [place], [place] [address]   Blood Culture (routine x 2)     Status: None (Preliminary result)   Collection Time: 06/03/20  5:36 PM   Specimen: BLOOD  Result Value Ref Range Status   Specimen Description   Final    BLOOD RIGHT ANTECUBITAL Performed at [facility] [address], [place], [place] [address]    Special Requests   Final    BOTTLES DRAWN AEROBIC AND ANAEROBIC Blood Culture adequate volume Performed at [facility] [address] [place], [place] [address]    Culture   Final    NO GROWTH 3 DAYS Performed at [facility], [place] [address], [place], [place] [address]    Report Status PENDING  Incomplete  Blood Culture (routine x 2)     Status: None (Preliminary result)   Collection Time: 06/03/20  5:36 PM   Specimen: BLOOD  Result Value Ref Range Status   Specimen Description   Final    BLOOD LEFT ARM UPPER Performed at [facility] [address], [place], [place] [address]    Special Requests   Final    BOTTLES DRAWN AEROBIC AND ANAEROBIC Blood Culture adequate volume Performed at [facility] [address] [place], [place] [address]    Culture   Final    NO GROWTH 3 DAYS Performed at [facility], [place] [address], [place], [place] [address]    Report Status PENDING  Incomplete  Urine culture     Status: Abnormal (Preliminary result)   Collection Time: 06/03/20  8:30 PM   Specimen: In/Out Cath Urine  Result Value Ref Range Status   Specimen Description   Final    IN/OUT CATH URINE Performed at [facility] [address] [place], [place] [address]    Special Requests   Final    NONE Performed at [facility], [place] [address] [place], [place] [address]    Culture MULTIPLE SPECIES PRESENT, SUGGEST RECOLLECTION (A)  Final   Report Status PENDING  Incomplete  C Difficile Quick Screen w  PCR reflex     Status: None   Collection Time: 06/05/20  1:45 PM  Result Value Ref Range Status   C Diff antigen NEGATIVE NEGATIVE Final   C Diff toxin NEGATIVE NEGATIVE Final   C Diff interpretation No C. difficile detected.  Final    Comment: Performed at [facility], [place] [address], [place], [place] [address]  Gastrointestinal Panel by PCR , Stool     Status: None   Collection Time: 06/05/20  3:45 PM  Result Value Ref Range Status   Campylobacter species NOT DETECTED NOT DETECTED Final   Plesimonas shigelloides NOT DETECTED NOT DETECTED Final   Salmonella species NOT DETECTED NOT DETECTED Final   Yersinia enterocolitica NOT DETECTED NOT DETECTED Final   Vibrio species NOT DETECTED NOT DETECTED Final   Vibrio cholerae NOT DETECTED NOT DETECTED Final   Enteroaggregative E coli (EAEC) NOT DETECTED NOT DETECTED Final   Enteropathogenic E coli (EPEC) NOT DETECTED NOT DETECTED Final   Enterotoxigenic E coli (ETEC) NOT DETECTED NOT DETECTED Final   Shiga like toxin producing E coli (STEC) NOT DETECTED NOT DETECTED Final   Shigella/Enteroinvasive E coli (EIEC) NOT DETECTED NOT DETECTED Final    Cryptosporidium NOT DETECTED NOT DETECTED Final   Cyclospora cayetanensis NOT DETECTED NOT DETECTED Final   Entamoeba histolytica NOT DETECTED NOT DETECTED Final   Giardia lamblia NOT DETECTED NOT DETECTED Final   Adenovirus F40/41 NOT DETECTED NOT DETECTED Final   Astrovirus NOT DETECTED NOT DETECTED Final   Norovirus GI/GII NOT DETECTED NOT DETECTED Final   Rotavirus A NOT DETECTED NOT DETECTED Final   Sapovirus (I, II, IV, and V) NOT DETECTED NOT DETECTED Final    Comment: Performed at [facility], [place] [place],  [address]     Labs:  CBC: Recent Labs  Lab 06/02/20 1959 06/03/20 1312 06/04/20 0029 06/04/20 0626 06/05/20 0521 06/06/20 0556 06/07/20 0949  WBC 15.7* 16.9* 11.1* 10.8* 8.3  --  9.9  NEUTROABS 11.3*  --   --  7.0  --   --   --   HGB 10.4* 10.5* 8.2* 8.3* 8.4* 7.6* 8.6*  HCT 32.2* 31.0* 25.0* 25.1* 25.6* 23.9* 27.4*  MCV 98.2 94.2 96.9 96.5 98.8  --  100.4*  PLT 286 320 232 229 241  --  259   BMP &GFR Recent Labs  Lab 06/03/20 1312 06/03/20 1652 06/04/20 0029 06/04/20 0626 06/05/20 0521 06/06/20 0556 06/07/20 0535  NA 139  --   --  139 140 141 140  K 2.9*  --   --  3.0* 2.9* 4.1 4.0  CL 106  --   --  105 104 108 107  CO2 18*  --   --  23 25 24 23   GLUCOSE 115*  --   --  94 120* 120* 103*  BUN 12  --   --  8 6 6 6   CREATININE 1.13*  --  0.90 1.04* 1.17* 1.23* 1.01*  CALCIUM 8.1*  --   --  7.4* 7.8* 8.1* 8.2*  MG  --  1.2*  --  1.5* 1.9 1.7 2.1  PHOS  --   --   --   --  2.3* 2.0* 3.1   Estimated Creatinine Clearance: 51.9 mL/min (A) (by C-G formula based on SCr of 1.01 mg/dL (H)). Liver & Pancreas: Recent Labs  Lab 06/02/20 1959 06/03/20 1312 06/04/20 0626 06/05/20 0521 06/06/20 0556 06/07/20 0535  AST 36 28 18  --   --   --  ALT 29 28 20   --   --   --   ALKPHOS 85 96 76  --   --   --   BILITOT 0.5 0.8 0.7  --   --   --   PROT 7.0 7.3 5.6*  --   --   --   ALBUMIN 3.6 4.0 2.9* 2.8* 2.8* 2.8*   Recent Labs   Lab 06/02/20 1959 06/03/20 1312  LIPASE 40 36   No results for input(s): AMMONIA in the last 168 hours. Diabetic: No results for input(s): HGBA1C in the last 72 hours. No results for input(s): GLUCAP in the last 168 hours. Cardiac Enzymes: No results for input(s): CKTOTAL, CKMB, CKMBINDEX, TROPONINI in the last 168 hours. No results for input(s): PROBNP in the last 8760 hours. Coagulation Profile: Recent Labs  Lab 06/03/20 1638  INR 1.1   Thyroid Function Tests: No results for input(s): TSH, T4TOTAL, FREET4, T3FREE, THYROIDAB in the last 72 hours. Lipid Profile: No results for input(s): CHOL, HDL, LDLCALC, TRIG, CHOLHDL, LDLDIRECT in the last 72 hours. Anemia Panel: Recent Labs    06/07/20 0535  VITAMINB12 924*  FOLATE 5.8*  FERRITIN 211  TIBC 283  IRON 86   Urine analysis:    Component Value Date/Time   COLORURINE YELLOW 06/03/2020 2030   APPEARANCEUR HAZY (A) 06/03/2020 2030   LABSPEC 1.033 (H) 06/03/2020 2030   PHURINE 5.0 06/03/2020 2030   GLUCOSEU NEGATIVE 06/03/2020 2030   HGBUR NEGATIVE 06/03/2020 2030   [place] NEGATIVE 06/03/2020 2030   KETONESUR 5 (A) 06/03/2020 2030   PROTEINUR >=300 (A) 06/03/2020 2030   NITRITE NEGATIVE 06/03/2020 2030   LEUKOCYTESUR NEGATIVE 06/03/2020 2030   Sepsis Labs: Invalid input(s): PROCALCITONIN, LACTICIDVEN   Time coordinating discharge: 45 minutes  SIGNED:  Mercy Riding, MD  [facility] 06/07/2020, 2:24 PM  If 7PM-7AM, please contact night-coverage [email or web address]

## 2020-06-09 LAB — OSMOLALITY, STOOL: Osmolality,Stl: 290 mOsmol/kg

## 2020-06-09 LAB — CULTURE, BLOOD (ROUTINE X 2)
Culture: NO GROWTH
Culture: NO GROWTH
Special Requests: ADEQUATE
Special Requests: ADEQUATE

## 2020-06-09 LAB — O&P RESULT

## 2020-06-09 LAB — OVA + PARASITE EXAM

## 2020-06-10 LAB — POTASSIUM, STOOL

## 2020-06-10 LAB — SODIUM, STOOL

## 2020-07-19 ENCOUNTER — Other Ambulatory Visit: Payer: Self-pay

## 2020-07-19 ENCOUNTER — Encounter: Payer: Self-pay | Admitting: Physical Therapy

## 2020-07-19 ENCOUNTER — Ambulatory Visit: Payer: Medicare HMO | Attending: Neurosurgery | Admitting: Physical Therapy

## 2020-07-19 DIAGNOSIS — G8929 Other chronic pain: Secondary | ICD-10-CM | POA: Insufficient documentation

## 2020-07-19 DIAGNOSIS — M5136 Other intervertebral disc degeneration, lumbar region: Secondary | ICD-10-CM | POA: Insufficient documentation

## 2020-07-19 DIAGNOSIS — M545 Low back pain, unspecified: Secondary | ICD-10-CM | POA: Insufficient documentation

## 2020-07-19 NOTE — Therapy (Signed)
[facility] [place], [name], [address] Phone: [phone]   Fax:  [phone]  Physical Therapy Evaluation  Patient Details  Name: Teresa Lee MRN: [id number] Date of Birth: 01-27-1961 Referring Provider (PT): Fenton Malling   Encounter Date: 07/19/2020   PT End of Session - 07/19/20 1114    Visit Number 1    Number of Visits 12    Date for PT Re-Evaluation 08/30/20    PT Start Time 0947    PT Stop Time 1014    PT Time Calculation (min) 27 min    Activity Tolerance Patient tolerated treatment well    Behavior During Therapy [facility] for tasks assessed/performed           Past Medical History:  Diagnosis Date  . Arthritis    osteoarthritis  . Asthma   . Blind left eye   . Cancer ([place])    skin  . Crohn's disease ([place])   . CTS (carpal tunnel syndrome)   . DJD (degenerative joint disease)    Both cervical spine and LS spine  . Fibromyalgia   . GERD (gastroesophageal reflux disease)   . Heart murmur   . Herniated nucleus pulposus   . Hyperlipidemia   . Hypertension   . IBS (irritable bowel syndrome)   . Leukocytosis   . Myasthenia gravis   . Myasthenia gravis ([place])   . Myasthenia gravis ([place])   . Pneumonia   . Spinal cord stimulator status     Past Surgical History:  Procedure Laterality Date  . BACK SURGERY    . BIOPSY  05/24/2020   Procedure: BIOPSY;  Surgeon: Ronald Lobo, MD;  Location: [facility];  Service: Endoscopy;;  . BREAST REDUCTION SURGERY    . CARPAL TUNNEL RELEASE     bilateral  . cervical disc inj    . CHOLECYSTECTOMY    . COLONOSCOPY WITH PROPOFOL N/A 05/24/2020   Procedure: COLONOSCOPY WITH PROPOFOL;  Surgeon: Ronald Lobo, MD;  Location: [facility];  Service: Endoscopy;  Laterality: N/A;  . ENDOMETRIAL ABLATION W/ NOVASURE    . ESOPHAGOGASTRODUODENOSCOPY (EGD) WITH PROPOFOL N/A 05/24/2020   Procedure: ESOPHAGOGASTRODUODENOSCOPY (EGD) WITH PROPOFOL;  Surgeon: Ronald Lobo, MD;  Location: [facility];  Service: Endoscopy;  Laterality: N/A;  . LUMBAR LAMINECTOMY/DECOMPRESSION MICRODISCECTOMY Right 05/04/2020   Procedure: Right Lumbar Five Sacral One Microdiscectomy;  Surgeon: Erline Levine, MD;  Location: [facility];  Service: Neurosurgery;  Laterality: Right;  posterior  . MELANOMA EXCISION    . NASAL SINUS SURGERY    . SPINAL CORD STIMULATOR INSERTION  2019  . THYMECTOMY     due to myastenia gravis  . THYMECTOMY    . TRANSFORAMINAL LUMBAR INTERBODY FUSION (TLIF) WITH PEDICLE SCREW FIXATION 1 LEVEL Right 05/04/2020   Procedure: Right Lumbar Four-Five Transforaminal lumbar interbody fusion;  Surgeon: Erline Levine, MD;  Location: [facility];  Service: Neurosurgery;  Laterality: Right;  posterior    There were no vitals filed for this visit.    Subjective Assessment - 07/19/20 1018    Subjective COVID-19 screen performed prior to patient entering clinic.  The patient presents to the clinic today s/p TLIF L4-5 performed on 05/04/20.  She states she began dehydrated and had a subsquent 7 day hospital stay and then had an incident in which she feel from her shoer seat after passing out.  It was discovered she had an infection and then another 6 day hospital stay.  She is off antibiotics and the infection  cleared up.  Her pain-level is a 6/10 today and can go higher  with standing and bending.  She has discovered anything decreases her pain that much currently.  Her lumbar spinal cord stimulator is still in place.    Pertinent History OA, blind in left eye, CTS, DJD, Fibromylagia, MG, HTN.    How long can you stand comfortably? Varies.    How long can you walk comfortably? Short community distance.    Patient Stated Goals Move and perform ADL's with less pain.    Currently in Pain? Yes    Pain Score 6     Pain Location Back    Pain Orientation Right;Left    Pain Descriptors / Indicators Aching    Pain Type Surgical pain    Pain Onset More than a month ago    Pain  Frequency Constant    Aggravating Factors  See above.    Pain Relieving Factors See above.              [facility] PT Assessment - 07/19/20 0001      Assessment   Medical Diagnosis TFIL L4-5    Referring Provider (PT) Fenton Malling    Onset Date/Surgical Date 05/04/20      Precautions   Precaution Comments Proceed per lumbar fusion protocol.  Lumbar spinal stimulator in place.      Restrictions   Weight Bearing Restrictions No      Balance Screen   Has the patient fallen in the past 6 months Yes    How many times? 2.    Has the patient had a decrease in activity level because of a fear of falling?  Yes    Is the patient reluctant to leave their home because of a fear of falling?  Yes      [place] residence      Prior Function   Level of Independence Independent      Observation/Other Assessments   Focus on Therapeutic Outcomes (FOTO)  Complete.      Posture/Postural Control   Posture Comments Generally good posture.      Deep Tendon Reflexes   DTR Assessment Site Patella;Achilles    Patella DTR 2+    Achilles DTR 2+      ROM / Strength   AROM / PROM / Strength AROM;Strength      AROM   Overall AROM Comments In supine:  Normal bilateral hip flexion.      Strength   Overall Strength Comments Normal bilateral knee and ankle strength.      Palpation   Palpation comment Tender to palpation and very taut to palption over lumbar musculature on either side of her lumbar incision.      Bed Mobility   Bed Mobility --   WNL.     Ambulation/Gait   Gait Comments WNL.                      Objective measurements completed on examination: See above findings.                    PT Long Term Goals - 07/19/20 1128      PT LONG TERM GOAL #1   Title Patient will be independent with HEP    Time 6    Period Weeks    Status New      PT LONG TERM GOAL #2   Title Perform ADL's with pain not >  3-4/10.     Time 6    Period Weeks    Status New      PT LONG TERM GOAL #3   Title Walk a community distance with pain not > 3-4/10.    Time 6    Period Weeks    Status New                  Plan - 07/19/20 1123    Clinical Impression Statement The patient presents to OPPT s/p TFIF at L4-L5 performed on 05/04/20.  She is actually doing quite well inspite of having two hospitalizations for 7 and 6 days.  Her lumbar mmusculature is tender and taut to palpation on either side of her lumbar incision.  Her hip flexion is normal when assessed in supine.  Her bilateral knee and ankle strength is normal.  She has limited ability to perform ADL's currently.  Patient will benefit from skilled physical therapy intervention to address pain and deficits.    Personal Factors and Comorbidities Comorbidity 1;Comorbidity 2;Comorbidity 3+;Other    Comorbidities OA, blind in left eye, CTS, DJD, Fibromylagia, MG, HTN.    Examination-Activity Limitations Other    Examination-Participation Restrictions Other    Stability/Clinical Decision Making Stable/Uncomplicated    Clinical Decision Making Low    Rehab Potential Good    PT Frequency 2x / week    PT Duration 6 weeks    PT Treatment/Interventions ADLs/Self Care Home Management;Cryotherapy;Moist Heat;Therapeutic activities;Therapeutic exercise;Manual techniques;Patient/family education    PT Next Visit Plan STW/M, core exercise progression per lumbar fusion protocol.    Consulted and Agree with Plan of Care Patient           Patient will benefit from skilled therapeutic intervention in order to improve the following deficits and impairments:  Pain,Decreased activity tolerance,Increased muscle spasms  Visit Diagnosis: Chronic bilateral low back pain, unspecified whether sciatica present - Plan: PT plan of care cert/re-cert     Problem List Patient Active Problem List   Diagnosis Date Noted  . SIRS (systemic inflammatory response syndrome) ([place])  06/03/2020  . ARF (acute renal failure) ([place]) 05/22/2020  . Diarrhea 05/22/2020  . Nausea 05/22/2020  . Spondylolisthesis of lumbar region 05/04/2020  . Chronic pain disorder 08/12/2019  . COVID-19   . Acute on chronic renal failure ([place]) 02/16/2019  . Insomnia disorder 02/27/2018  . Fatigue 02/27/2018  . Fibromyalgia syndrome 04/30/2017  . RLS (restless legs syndrome) 12/26/2016  . B12 deficiency 12/26/2016  . Vitamin D deficiency, unspecified 12/26/2016  . GERD (gastroesophageal reflux disease) 12/26/2016  . Anxiety disorder 12/26/2016  . Hyponatremia 10/24/2016  . AKI (acute kidney injury) ([place]) 10/23/2016  . Hypomagnesemia 10/22/2016  . Hypophosphatemia 10/22/2016  . Hypokalemia 10/21/2016  . Wound infection after surgery 10/21/2016  . Sepsis ([place]) 10/21/2016  . Myasthenia gravis ([place]) 10/21/2016  . Melanoma ([place]) 10/21/2016  . Crohn disease ([place]) 10/21/2016  . Cellulitis 10/21/2016  . Palpitations 08/25/2013  . Leukocytosis 05/25/2011  . Hyperlipidemia 04/20/2007  . Essential hypertension 04/20/2007  . Asthma 04/20/2007  . CARPAL TUNNEL SYNDROME, HX OF 04/20/2007    Jernee Murtaugh, [place] MPT 07/19/2020, 11:35 AM  [facility] [address] [place], [name], [address] Phone: [phone]   Fax:  [phone]  Name: Jeanice Dempsey MRN: [id number] Date of Birth: 23-Jun-1960

## 2020-07-26 ENCOUNTER — Other Ambulatory Visit: Payer: Self-pay

## 2020-07-26 ENCOUNTER — Ambulatory Visit: Payer: Medicare HMO | Admitting: Physical Therapy

## 2020-07-26 DIAGNOSIS — M5136 Other intervertebral disc degeneration, lumbar region: Secondary | ICD-10-CM | POA: Diagnosis not present

## 2020-07-26 DIAGNOSIS — G8929 Other chronic pain: Secondary | ICD-10-CM

## 2020-07-26 DIAGNOSIS — M545 Low back pain, unspecified: Secondary | ICD-10-CM

## 2020-07-26 NOTE — Therapy (Signed)
[facility] [place], [name], [address] Phone: [phone]   Fax:  [phone]  Physical Therapy Treatment  Patient Details  Name: Teresa Lee MRN: [id number] Date of Birth: 24-Feb-1961 Referring Provider (PT): Fenton Malling   Encounter Date: 07/26/2020   PT End of Session - 07/26/20 1407    Visit Number 3    Date for PT Re-Evaluation 08/30/20    PT Start Time 0100    PT Stop Time 0150    PT Time Calculation (min) 50 min    Activity Tolerance Patient tolerated treatment well    Behavior During Therapy [facility] for tasks assessed/performed           Past Medical History:  Diagnosis Date  . Arthritis    osteoarthritis  . Asthma   . Blind left eye   . Cancer ([place])    skin  . Crohn's disease ([place])   . CTS (carpal tunnel syndrome)   . DJD (degenerative joint disease)    Both cervical spine and LS spine  . Fibromyalgia   . GERD (gastroesophageal reflux disease)   . Heart murmur   . Herniated nucleus pulposus   . Hyperlipidemia   . Hypertension   . IBS (irritable bowel syndrome)   . Leukocytosis   . Myasthenia gravis   . Myasthenia gravis ([place])   . Myasthenia gravis ([place])   . Pneumonia   . Spinal cord stimulator status     Past Surgical History:  Procedure Laterality Date  . BACK SURGERY    . BIOPSY  05/24/2020   Procedure: BIOPSY;  Surgeon: Ronald Lobo, MD;  Location: [place];  Service: Endoscopy;;  . BREAST REDUCTION SURGERY    . CARPAL TUNNEL RELEASE     bilateral  . cervical disc inj    . CHOLECYSTECTOMY    . COLONOSCOPY WITH PROPOFOL N/A 05/24/2020   Procedure: COLONOSCOPY WITH PROPOFOL;  Surgeon: Ronald Lobo, MD;  Location: [place];  Service: Endoscopy;  Laterality: N/A;  . ENDOMETRIAL ABLATION W/ NOVASURE    . ESOPHAGOGASTRODUODENOSCOPY (EGD) WITH PROPOFOL N/A 05/24/2020   Procedure: ESOPHAGOGASTRODUODENOSCOPY (EGD) WITH PROPOFOL;  Surgeon: Ronald Lobo, MD;  Location: [place];  Service: Endoscopy;  Laterality: N/A;  . LUMBAR LAMINECTOMY/DECOMPRESSION MICRODISCECTOMY Right 05/04/2020   Procedure: Right Lumbar Five Sacral One Microdiscectomy;  Surgeon: Erline Levine, MD;  Location: [place];  Service: Neurosurgery;  Laterality: Right;  posterior  . MELANOMA EXCISION    . NASAL SINUS SURGERY    . SPINAL CORD STIMULATOR INSERTION  2019  . THYMECTOMY     due to myastenia gravis  . THYMECTOMY    . TRANSFORAMINAL LUMBAR INTERBODY FUSION (TLIF) WITH PEDICLE SCREW FIXATION 1 LEVEL Right 05/04/2020   Procedure: Right Lumbar Four-Five Transforaminal lumbar interbody fusion;  Surgeon: Erline Levine, MD;  Location: [place];  Service: Neurosurgery;  Laterality: Right;  posterior    There were no vitals filed for this visit.   Subjective Assessment - 07/26/20 1339    Subjective COVID-19 screen performed prior to patient entering clinic.  No new complaints.    Pertinent History OA, blind in left eye, CTS, DJD, Fibromylagia, MG, HTN.    How long can you stand comfortably? Varies.    How long can you walk comfortably? Short community distance.    Patient Stated Goals Move and perform ADL's with less pain.    Currently in Pain? Yes    Pain Score 6     Pain Location Back  Pain Orientation Right;Left    Pain Descriptors / Indicators Aching    Pain Onset More than a month ago                             [facility] Adult PT Treatment/Exercise - 07/26/20 0001      Modalities   Modalities Electrical Stimulation;Moist Heat;Ultrasound      Moist Heat Therapy   Number Minutes Moist Heat 20 Minutes    Moist Heat Location Lumbar Spine      Electrical Stimulation   Electrical Stimulation Location LB    Electrical Stimulation Action IFC    Electrical Stimulation Parameters 80-150 Hz x 20 minutes.    Electrical Stimulation Goals Pain      Ultrasound   Ultrasound Location LB musculature.    Ultrasound Parameters Small soundhead x 12 minutes at 1.50 W/CM2       Manual Therapy   Manual Therapy Soft tissue mobilization    Manual therapy comments STW/M x 11 minutes to patient's bilateral lumbar musculature.                       PT Long Term Goals - 07/19/20 1128      PT LONG TERM GOAL #1   Title Patient will be independent with HEP    Time 6    Period Weeks    Status New      PT LONG TERM GOAL #2   Title Perform ADL's with pain not > 3-4/10.    Time 6    Period Weeks    Status New      PT LONG TERM GOAL #3   Title Walk a community distance with pain not > 3-4/10.    Time 6    Period Weeks    Status New                 Plan - 07/26/20 1340    Clinical Impression Statement Patient did well with treatment.  Continued tenderness over lumbar musculature.    Personal Factors and Comorbidities Comorbidity 1;Comorbidity 2;Comorbidity 3+;Other    Comorbidities OA, blind in left eye, CTS, DJD, Fibromylagia, MG, HTN.    Examination-Activity Limitations Other    PT Treatment/Interventions ADLs/Self Care Home Management;Cryotherapy;Moist Heat;Therapeutic activities;Therapeutic exercise;Manual techniques;Patient/family education;Electrical Stimulation;Ultrasound           Patient will benefit from skilled therapeutic intervention in order to improve the following deficits and impairments:     Visit Diagnosis: Chronic bilateral low back pain, unspecified whether sciatica present     Problem List Patient Active Problem List   Diagnosis Date Noted  . SIRS (systemic inflammatory response syndrome) ([place]) 06/03/2020  . ARF (acute renal failure) ([place]) 05/22/2020  . Diarrhea 05/22/2020  . Nausea 05/22/2020  . Spondylolisthesis of lumbar region 05/04/2020  . Chronic pain disorder 08/12/2019  . COVID-19   . Acute on chronic renal failure ([place]) 02/16/2019  . Insomnia disorder 02/27/2018  . Fatigue 02/27/2018  . Fibromyalgia syndrome 04/30/2017  . RLS (restless legs syndrome) 12/26/2016  . B12 deficiency  12/26/2016  . Vitamin D deficiency, unspecified 12/26/2016  . GERD (gastroesophageal reflux disease) 12/26/2016  . Anxiety disorder 12/26/2016  . Hyponatremia 10/24/2016  . AKI (acute kidney injury) ([place]) 10/23/2016  . Hypomagnesemia 10/22/2016  . Hypophosphatemia 10/22/2016  . Hypokalemia 10/21/2016  . Wound infection after surgery 10/21/2016  . Sepsis ([place]) 10/21/2016  . Myasthenia gravis ([place]) 10/21/2016  .  Melanoma ([place]) 10/21/2016  . Crohn disease ([place]) 10/21/2016  . Cellulitis 10/21/2016  . Palpitations 08/25/2013  . Leukocytosis 05/25/2011  . Hyperlipidemia 04/20/2007  . Essential hypertension 04/20/2007  . Asthma 04/20/2007  . CARPAL TUNNEL SYNDROME, HX OF 04/20/2007    Anyah Swallow, [place] MPT 07/26/2020, 2:17 PM  [facility] [place], [name], [address] Phone: [phone]   Fax:  [phone]  Name: Teresa Lee MRN: [id number] Date of Birth: [birth year]

## 2020-07-27 ENCOUNTER — Ambulatory Visit: Payer: Medicare HMO | Admitting: *Deleted

## 2020-08-03 ENCOUNTER — Ambulatory Visit: Payer: Medicare HMO | Admitting: *Deleted

## 2020-08-03 ENCOUNTER — Other Ambulatory Visit: Payer: Self-pay

## 2020-08-03 DIAGNOSIS — M5136 Other intervertebral disc degeneration, lumbar region: Secondary | ICD-10-CM | POA: Diagnosis not present

## 2020-08-03 DIAGNOSIS — G8929 Other chronic pain: Secondary | ICD-10-CM

## 2020-08-03 NOTE — Therapy (Signed)
[facility] [place], [name], [address] Phone: [phone]   Fax:  [phone]  Physical Therapy Treatment  Patient Details  Name: Teresa Lee MRN: [id number] Date of Birth: 10-Nov-1960 Referring Provider (PT): Fenton Malling   Encounter Date: 08/03/2020   PT End of Session - 08/03/20 1314    Visit Number 4    Number of Visits 12    Date for PT Re-Evaluation 08/30/20    PT Start Time 1115    PT Stop Time 1205    PT Time Calculation (min) 50 min           Past Medical History:  Diagnosis Date  . Arthritis    osteoarthritis  . Asthma   . Blind left eye   . Cancer ([place])    skin  . Crohn's disease ([place])   . CTS (carpal tunnel syndrome)   . DJD (degenerative joint disease)    Both cervical spine and LS spine  . Fibromyalgia   . GERD (gastroesophageal reflux disease)   . Heart murmur   . Herniated nucleus pulposus   . Hyperlipidemia   . Hypertension   . IBS (irritable bowel syndrome)   . Leukocytosis   . Myasthenia gravis   . Myasthenia gravis ([place])   . Myasthenia gravis ([place])   . Pneumonia   . Spinal cord stimulator status     Past Surgical History:  Procedure Laterality Date  . BACK SURGERY    . BIOPSY  05/24/2020   Procedure: BIOPSY;  Surgeon: Ronald Lobo, MD;  Location: [place];  Service: Endoscopy;;  . BREAST REDUCTION SURGERY    . CARPAL TUNNEL RELEASE     bilateral  . cervical disc inj    . CHOLECYSTECTOMY    . COLONOSCOPY WITH PROPOFOL N/A 05/24/2020   Procedure: COLONOSCOPY WITH PROPOFOL;  Surgeon: Ronald Lobo, MD;  Location: [place];  Service: Endoscopy;  Laterality: N/A;  . ENDOMETRIAL ABLATION W/ NOVASURE    . ESOPHAGOGASTRODUODENOSCOPY (EGD) WITH PROPOFOL N/A 05/24/2020   Procedure: ESOPHAGOGASTRODUODENOSCOPY (EGD) WITH PROPOFOL;  Surgeon: Ronald Lobo, MD;  Location: [place];  Service: Endoscopy;  Laterality: N/A;  . LUMBAR LAMINECTOMY/DECOMPRESSION  MICRODISCECTOMY Right 05/04/2020   Procedure: Right Lumbar Five Sacral One Microdiscectomy;  Surgeon: Erline Levine, MD;  Location: [place];  Service: Neurosurgery;  Laterality: Right;  posterior  . MELANOMA EXCISION    . NASAL SINUS SURGERY    . SPINAL CORD STIMULATOR INSERTION  2019  . THYMECTOMY     due to myastenia gravis  . THYMECTOMY    . TRANSFORAMINAL LUMBAR INTERBODY FUSION (TLIF) WITH PEDICLE SCREW FIXATION 1 LEVEL Right 05/04/2020   Procedure: Right Lumbar Four-Five Transforaminal lumbar interbody fusion;  Surgeon: Erline Levine, MD;  Location: [place];  Service: Neurosurgery;  Laterality: Right;  posterior    There were no vitals filed for this visit.   Subjective Assessment - 08/03/20 1117    Subjective COVID-19 screen performed prior to patient entering clinic.  No new complaints.    Pertinent History OA, blind in left eye, CTS, DJD, Fibromylagia, MG, HTN.    How long can you stand comfortably? Varies.    How long can you walk comfortably? Short community distance.    Patient Stated Goals Move and perform ADL's with less pain.    Currently in Pain? Yes    Pain Score 6     Pain Location Back    Pain Orientation Right;Left    Pain Descriptors / Indicators  Aching    Pain Type Surgical pain    Pain Onset More than a month ago                             [facility] Adult PT Treatment/Exercise - 08/03/20 0001      Therapeutic Activites    Therapeutic Activities ADL's    ADL's AB bracing with mini-squat, chair squat and hinge bending to prepare for lifting. Donning socks/shoes, Log roll in/out of bed.kneeling to pick up items.      Exercises   Exercises Lumbar      Lumbar Exercises: Standing   Other Standing Lumbar Exercises Standing at counter modified  bird-dog arm raise x 5 hold 5 secs each f/b Leg raise x 5 hold 5 secs each                       PT Long Term Goals - 07/19/20 1128      PT LONG TERM GOAL #1   Title Patient will be  independent with HEP    Time 6    Period Weeks    Status New      PT LONG TERM GOAL #2   Title Perform ADL's with pain not > 3-4/10.    Time 6    Period Weeks    Status New      PT LONG TERM GOAL #3   Title Walk a community distance with pain not > 3-4/10.    Time 6    Period Weeks    Status New                 Plan - 08/03/20 1315    Clinical Impression Statement Pt arrived today doing fair with LBP. Today's Rx. focused on education for good spinal hygiene and neutral position for ADL's. Discussed with Pt to avoid any movements or exs that trigger her pain. Talked with Pt about starting a walking program to increase core endurance. She reports not getting in the floor and that she would need to perform exs in her bed. Pt performed AB bracing and was able to perform modified bird -dog at counter with AB bracing and arm raise and did well. She also performed leg raise , but was to challenging.    Personal Factors and Comorbidities Comorbidity 1;Comorbidity 2;Comorbidity 3+;Other    Comorbidities OA, blind in left eye, CTS, DJD, Fibromylagia, MG, HTN.    Stability/Clinical Decision Making Stable/Uncomplicated    Rehab Potential Good    PT Frequency 2x / week    PT Duration 6 weeks    PT Treatment/Interventions ADLs/Self Care Home Management;Cryotherapy;Moist Heat;Therapeutic activities;Therapeutic exercise;Manual techniques;Patient/family education;Electrical Stimulation;Ultrasound    PT Next Visit Plan STW/M, core exercise progression per lumbar fusion protocol.           Patient will benefit from skilled therapeutic intervention in order to improve the following deficits and impairments:  Pain,Decreased activity tolerance,Increased muscle spasms  Visit Diagnosis: Chronic bilateral low back pain, unspecified whether sciatica present     Problem List Patient Active Problem List   Diagnosis Date Noted  . SIRS (systemic inflammatory response syndrome) ([place]) 06/03/2020   . ARF (acute renal failure) ([place]) 05/22/2020  . Diarrhea 05/22/2020  . Nausea 05/22/2020  . Spondylolisthesis of lumbar region 05/04/2020  . Chronic pain disorder 08/12/2019  . COVID-19   . Acute on chronic renal failure ([place]) 02/16/2019  . Insomnia disorder 02/27/2018  .  Fatigue 02/27/2018  . Fibromyalgia syndrome 04/30/2017  . RLS (restless legs syndrome) 12/26/2016  . B12 deficiency 12/26/2016  . Vitamin D deficiency, unspecified 12/26/2016  . GERD (gastroesophageal reflux disease) 12/26/2016  . Anxiety disorder 12/26/2016  . Hyponatremia 10/24/2016  . AKI (acute kidney injury) ([place]) 10/23/2016  . Hypomagnesemia 10/22/2016  . Hypophosphatemia 10/22/2016  . Hypokalemia 10/21/2016  . Wound infection after surgery 10/21/2016  . Sepsis ([place]) 10/21/2016  . Myasthenia gravis ([place]) 10/21/2016  . Melanoma ([place]) 10/21/2016  . Crohn disease ([place]) 10/21/2016  . Cellulitis 10/21/2016  . Palpitations 08/25/2013  . Leukocytosis 05/25/2011  . Hyperlipidemia 04/20/2007  . Essential hypertension 04/20/2007  . Asthma 04/20/2007  . CARPAL TUNNEL SYNDROME, HX OF 04/20/2007    Schneur Crowson,CHRIS, PTA 08/03/2020, 1:37 PM  [facility] [place], [name], [address] Phone: [phone]   Fax:  [phone]  Name: Teresa Lee MRN: [id number] Date of Birth: 10/28/1960

## 2020-08-10 ENCOUNTER — Ambulatory Visit: Payer: Medicare HMO | Admitting: *Deleted

## 2020-08-10 ENCOUNTER — Other Ambulatory Visit: Payer: Self-pay

## 2020-08-10 DIAGNOSIS — M545 Low back pain, unspecified: Secondary | ICD-10-CM

## 2020-08-10 DIAGNOSIS — G8929 Other chronic pain: Secondary | ICD-10-CM

## 2020-08-10 DIAGNOSIS — M5136 Other intervertebral disc degeneration, lumbar region: Secondary | ICD-10-CM | POA: Diagnosis not present

## 2020-08-10 NOTE — Therapy (Signed)
[facility] [place], [name], [address] Phone: [phone]   Fax:  [phone]  Physical Therapy Treatment  Patient Details  Name: Teresa Lee MRN: [id number] Date of Birth: 12/26/1960 Referring Provider (PT): Fenton Malling   Encounter Date: 08/10/2020   PT End of Session - 08/10/20 1125    Visit Number 5    Number of Visits 12    Date for PT Re-Evaluation 08/30/20    PT Start Time 1117    PT Stop Time 1207    PT Time Calculation (min) 50 min           Past Medical History:  Diagnosis Date  . Arthritis    osteoarthritis  . Asthma   . Blind left eye   . Cancer ([place])    skin  . Crohn's disease ([place])   . CTS (carpal tunnel syndrome)   . DJD (degenerative joint disease)    Both cervical spine and LS spine  . Fibromyalgia   . GERD (gastroesophageal reflux disease)   . Heart murmur   . Herniated nucleus pulposus   . Hyperlipidemia   . Hypertension   . IBS (irritable bowel syndrome)   . Leukocytosis   . Myasthenia gravis   . Myasthenia gravis ([place])   . Myasthenia gravis ([place])   . Pneumonia   . Spinal cord stimulator status     Past Surgical History:  Procedure Laterality Date  . BACK SURGERY    . BIOPSY  05/24/2020   Procedure: BIOPSY;  Surgeon: Ronald Lobo, MD;  Location: [place];  Service: Endoscopy;;  . BREAST REDUCTION SURGERY    . CARPAL TUNNEL RELEASE     bilateral  . cervical disc inj    . CHOLECYSTECTOMY    . COLONOSCOPY WITH PROPOFOL N/A 05/24/2020   Procedure: COLONOSCOPY WITH PROPOFOL;  Surgeon: Ronald Lobo, MD;  Location: [place];  Service: Endoscopy;  Laterality: N/A;  . ENDOMETRIAL ABLATION W/ NOVASURE    . ESOPHAGOGASTRODUODENOSCOPY (EGD) WITH PROPOFOL N/A 05/24/2020   Procedure: ESOPHAGOGASTRODUODENOSCOPY (EGD) WITH PROPOFOL;  Surgeon: Ronald Lobo, MD;  Location: [place];  Service: Endoscopy;  Laterality: N/A;  . LUMBAR LAMINECTOMY/DECOMPRESSION  MICRODISCECTOMY Right 05/04/2020   Procedure: Right Lumbar Five Sacral One Microdiscectomy;  Surgeon: Erline Levine, MD;  Location: [place];  Service: Neurosurgery;  Laterality: Right;  posterior  . MELANOMA EXCISION    . NASAL SINUS SURGERY    . SPINAL CORD STIMULATOR INSERTION  2019  . THYMECTOMY     due to myastenia gravis  . THYMECTOMY    . TRANSFORAMINAL LUMBAR INTERBODY FUSION (TLIF) WITH PEDICLE SCREW FIXATION 1 LEVEL Right 05/04/2020   Procedure: Right Lumbar Four-Five Transforaminal lumbar interbody fusion;  Surgeon: Erline Levine, MD;  Location: [place];  Service: Neurosurgery;  Laterality: Right;  posterior    There were no vitals filed for this visit.   Subjective Assessment - 08/10/20 1118    Subjective COVID-19 screen performed prior to patient entering clinic.    Pertinent History OA, blind in left eye, CTS, DJD, Fibromylagia, MG, HTN.    How long can you stand comfortably? Varies.    How long can you walk comfortably? Short community distance.    Patient Stated Goals Move and perform ADL's with less pain.    Currently in Pain? Yes    Pain Score 6     Pain Location Back    Pain Orientation Right;Left    Pain Descriptors / Indicators Aching  [facility] Adult PT Treatment/Exercise - 08/10/20 0001      Therapeutic Activites    Therapeutic Activities ADL's    ADL's AB bracing with mini-squat, chair squat and hinge bending to prepare for lifting.  Paper given and demonstrated for posture for counter act.'s and sweeping, mopping, vacuuming with AB bracing      Exercises   Exercises Lumbar      Lumbar Exercises: Standing   Other Standing Lumbar Exercises Standing at counter modified  bird-dog arm raise x 5 hold 5 secs each f/b Leg raise x 5 hold 5 secs each      Modalities   Modalities Electrical Stimulation;Moist Heat;Ultrasound      Moist Heat Therapy   Number Minutes Moist Heat 15 Minutes    Moist Heat Location Lumbar Spine       Electrical Stimulation   Electrical Stimulation Location LB    Electrical Stimulation Action IFC    Electrical Stimulation Parameters 80-150hz  x 15 mins    Electrical Stimulation Goals Pain                       PT Long Term Goals - 07/19/20 1128      PT LONG TERM GOAL #1   Title Patient will be independent with HEP    Time 6    Period Weeks    Status New      PT LONG TERM GOAL #2   Title Perform ADL's with pain not > 3-4/10.    Time 6    Period Weeks    Status New      PT LONG TERM GOAL #3   Title Walk a community distance with pain not > 3-4/10.    Time 6    Period Weeks    Status New                 Plan - 08/10/20 1201    Clinical Impression Statement Pt arrived today reporting increased LBP from mopping at home yesterday. Body mechanics and postures discussed with AB bracing to decrease pain triggers. Heat/ estim end of Rx for pain control with normal response.    Personal Factors and Comorbidities Comorbidity 1;Comorbidity 2;Comorbidity 3+;Other    Comorbidities OA, blind in left eye, CTS, DJD, Fibromylagia, MG, HTN.    Examination-Activity Limitations Other    Examination-Participation Restrictions Other    Rehab Potential Good    PT Frequency 2x / week    PT Duration 6 weeks    PT Treatment/Interventions ADLs/Self Care Home Management;Cryotherapy;Moist Heat;Therapeutic activities;Therapeutic exercise;Manual techniques;Patient/family education;Electrical Stimulation;Ultrasound    PT Next Visit Plan STW/M, core exercise progression per lumbar fusion protocol.           Patient will benefit from skilled therapeutic intervention in order to improve the following deficits and impairments:  Pain,Decreased activity tolerance,Increased muscle spasms  Visit Diagnosis: Chronic bilateral low back pain, unspecified whether sciatica present     Problem List Patient Active Problem List   Diagnosis Date Noted  . SIRS (systemic inflammatory  response syndrome) ([place]) 06/03/2020  . ARF (acute renal failure) ([place]) 05/22/2020  . Diarrhea 05/22/2020  . Nausea 05/22/2020  . Spondylolisthesis of lumbar region 05/04/2020  . Chronic pain disorder 08/12/2019  . COVID-19   . Acute on chronic renal failure ([place]) 02/16/2019  . Insomnia disorder 02/27/2018  . Fatigue 02/27/2018  . Fibromyalgia syndrome 04/30/2017  . RLS (restless legs syndrome) 12/26/2016  . B12 deficiency 12/26/2016  . Vitamin D deficiency,  unspecified 12/26/2016  . GERD (gastroesophageal reflux disease) 12/26/2016  . Anxiety disorder 12/26/2016  . Hyponatremia 10/24/2016  . AKI (acute kidney injury) ([place]) 10/23/2016  . Hypomagnesemia 10/22/2016  . Hypophosphatemia 10/22/2016  . Hypokalemia 10/21/2016  . Wound infection after surgery 10/21/2016  . Sepsis ([place]) 10/21/2016  . Myasthenia gravis ([place]) 10/21/2016  . Melanoma ([place]) 10/21/2016  . Crohn disease ([place]) 10/21/2016  . Cellulitis 10/21/2016  . Palpitations 08/25/2013  . Leukocytosis 05/25/2011  . Hyperlipidemia 04/20/2007  . Essential hypertension 04/20/2007  . Asthma 04/20/2007  . CARPAL TUNNEL SYNDROME, HX OF 04/20/2007    Shihab States,CHRIS , PTA 08/10/2020, 12:57 PM  [facility] [place], [name], [address] Phone: [phone]   Fax:  [phone]  Name: Teresa Lee MRN: [id number] Date of Birth: [birth year]

## 2020-08-18 ENCOUNTER — Ambulatory Visit: Payer: Medicare HMO | Attending: Neurosurgery | Admitting: *Deleted

## 2020-08-18 ENCOUNTER — Other Ambulatory Visit: Payer: Self-pay

## 2020-08-18 DIAGNOSIS — M545 Low back pain, unspecified: Secondary | ICD-10-CM | POA: Insufficient documentation

## 2020-08-18 DIAGNOSIS — M797 Fibromyalgia: Secondary | ICD-10-CM | POA: Insufficient documentation

## 2020-08-18 DIAGNOSIS — M1990 Unspecified osteoarthritis, unspecified site: Secondary | ICD-10-CM | POA: Insufficient documentation

## 2020-08-18 DIAGNOSIS — G8929 Other chronic pain: Secondary | ICD-10-CM | POA: Diagnosis present

## 2020-08-18 NOTE — Therapy (Signed)
[facility] [place], [name], [address] Phone: [phone]   Fax:  [phone]  Physical Therapy Treatment  Patient Details  Name: Teresa Lee MRN: [id number] Date of Birth: 04-24-1960 Referring Provider (PT): Fenton Malling   Encounter Date: 08/18/2020   PT End of Session - 08/18/20 1618    Visit Number 6    Number of Visits 12    Date for PT Re-Evaluation 08/30/20    PT Start Time 1115    PT Stop Time 1205    PT Time Calculation (min) 50 min           Past Medical History:  Diagnosis Date  . Arthritis    osteoarthritis  . Asthma   . Blind left eye   . Cancer ([place])    skin  . Crohn's disease ([place])   . CTS (carpal tunnel syndrome)   . DJD (degenerative joint disease)    Both cervical spine and LS spine  . Fibromyalgia   . GERD (gastroesophageal reflux disease)   . Heart murmur   . Herniated nucleus pulposus   . Hyperlipidemia   . Hypertension   . IBS (irritable bowel syndrome)   . Leukocytosis   . Myasthenia gravis   . Myasthenia gravis ([place])   . Myasthenia gravis ([place])   . Pneumonia   . Spinal cord stimulator status     Past Surgical History:  Procedure Laterality Date  . BACK SURGERY    . BIOPSY  05/24/2020   Procedure: BIOPSY;  Surgeon: Ronald Lobo, MD;  Location: [place];  Service: Endoscopy;;  . BREAST REDUCTION SURGERY    . CARPAL TUNNEL RELEASE     bilateral  . cervical disc inj    . CHOLECYSTECTOMY    . COLONOSCOPY WITH PROPOFOL N/A 05/24/2020   Procedure: COLONOSCOPY WITH PROPOFOL;  Surgeon: Ronald Lobo, MD;  Location: [place];  Service: Endoscopy;  Laterality: N/A;  . ENDOMETRIAL ABLATION W/ NOVASURE    . ESOPHAGOGASTRODUODENOSCOPY (EGD) WITH PROPOFOL N/A 05/24/2020   Procedure: ESOPHAGOGASTRODUODENOSCOPY (EGD) WITH PROPOFOL;  Surgeon: Ronald Lobo, MD;  Location: [place];  Service: Endoscopy;  Laterality: N/A;  . LUMBAR LAMINECTOMY/DECOMPRESSION  MICRODISCECTOMY Right 05/04/2020   Procedure: Right Lumbar Five Sacral One Microdiscectomy;  Surgeon: Erline Levine, MD;  Location: [place];  Service: Neurosurgery;  Laterality: Right;  posterior  . MELANOMA EXCISION    . NASAL SINUS SURGERY    . SPINAL CORD STIMULATOR INSERTION  2019  . THYMECTOMY     due to myastenia gravis  . THYMECTOMY    . TRANSFORAMINAL LUMBAR INTERBODY FUSION (TLIF) WITH PEDICLE SCREW FIXATION 1 LEVEL Right 05/04/2020   Procedure: Right Lumbar Four-Five Transforaminal lumbar interbody fusion;  Surgeon: Erline Levine, MD;  Location: [place];  Service: Neurosurgery;  Laterality: Right;  posterior    There were no vitals filed for this visit.   Subjective Assessment - 08/18/20 1116    Subjective COVID-19 screen performed prior to patient entering clinic. Doing better at managing my pain    Pertinent History OA, blind in left eye, CTS, DJD, Fibromylagia, MG, HTN.. LB surgery 05-04-20 Dr. [name]    How long can you stand comfortably? Varies.    How long can you walk comfortably? Short community distance.    Patient Stated Goals Move and perform ADL's with less pain.    Currently in Pain? Yes    Pain Score 4     Pain Location Back    Pain Orientation Right;Left  Pain Descriptors / Indicators Aching    Pain Type Surgical pain    Pain Onset More than a month ago                             [facility] Adult PT Treatment/Exercise - 08/18/20 0001      Therapeutic Activites    Therapeutic Activities ADL's    ADL's Reviewed AB bracing with mini-squat, chair squat and hinge bending to prepare for lifting.      Exercises   Exercises Lumbar      Lumbar Exercises: Aerobic   Tread Mill 1 mph  x 10 mins  with focus on posture      Lumbar Exercises: Standing   Row Strengthening;Both;10 reps   XTS green band  AB brace with ROW x 10 with each foot forward hold 5 secs   Other Standing Lumbar Exercises Standing at counter modified  bird-dog arm raise x 5 hold 5 secs  each f/b Leg raise x 5 hold 5 secs each      Modalities   Modalities Electrical Stimulation;Moist Heat;Ultrasound      Moist Heat Therapy   Number Minutes Moist Heat 15 Minutes    Moist Heat Location Lumbar Spine      Electrical Stimulation   Electrical Stimulation Location LB    Electrical Stimulation Action IFC    Electrical Stimulation Parameters 80-150hz  x15 mins    Electrical Stimulation Goals Pain                       PT Long Term Goals - 07/19/20 1128      PT LONG TERM GOAL #1   Title Patient will be independent with HEP    Time 6    Period Weeks    Status New      PT LONG TERM GOAL #2   Title Perform ADL's with pain not > 3-4/10.    Time 6    Period Weeks    Status New      PT LONG TERM GOAL #3   Title Walk a community distance with pain not > 3-4/10.    Time 6    Period Weeks    Status New                 Plan - 08/18/20 1201    Clinical Impression Statement Pt arrived today doing fairly well ,but with reports of tightness in LB and doing with modified bird dog at counter. She did well with TM walk today at 1 mph x 10 mins with focus on posture.    Personal Factors and Comorbidities Comorbidity 1;Comorbidity 2;Comorbidity 3+;Other    Comorbidities OA, blind in left eye, CTS, DJD, Fibromylagia, MG, HTN.    Stability/Clinical Decision Making Stable/Uncomplicated    Rehab Potential Good    PT Frequency 2x / week    PT Duration 6 weeks    PT Treatment/Interventions ADLs/Self Care Home Management;Cryotherapy;Moist Heat;Therapeutic activities;Therapeutic exercise;Manual techniques;Patient/family education;Electrical Stimulation;Ultrasound    PT Next Visit Plan STW/M, core exercise progression per lumbar fusion protocol.    Consulted and Agree with Plan of Care Patient           Patient will benefit from skilled therapeutic intervention in order to improve the following deficits and impairments:  Pain,Decreased activity tolerance,Increased  muscle spasms  Visit Diagnosis: Chronic bilateral low back pain, unspecified whether sciatica present     Problem List Patient  Active Problem List   Diagnosis Date Noted  . SIRS (systemic inflammatory response syndrome) ([place]) 06/03/2020  . ARF (acute renal failure) ([place]) 05/22/2020  . Diarrhea 05/22/2020  . Nausea 05/22/2020  . Spondylolisthesis of lumbar region 05/04/2020  . Chronic pain disorder 08/12/2019  . COVID-19   . Acute on chronic renal failure ([place]) 02/16/2019  . Insomnia disorder 02/27/2018  . Fatigue 02/27/2018  . Fibromyalgia syndrome 04/30/2017  . RLS (restless legs syndrome) 12/26/2016  . B12 deficiency 12/26/2016  . Vitamin D deficiency, unspecified 12/26/2016  . GERD (gastroesophageal reflux disease) 12/26/2016  . Anxiety disorder 12/26/2016  . Hyponatremia 10/24/2016  . AKI (acute kidney injury) ([place]) 10/23/2016  . Hypomagnesemia 10/22/2016  . Hypophosphatemia 10/22/2016  . Hypokalemia 10/21/2016  . Wound infection after surgery 10/21/2016  . Sepsis ([place]) 10/21/2016  . Myasthenia gravis ([place]) 10/21/2016  . Melanoma ([place]) 10/21/2016  . Crohn disease ([place]) 10/21/2016  . Cellulitis 10/21/2016  . Palpitations 08/25/2013  . Leukocytosis 05/25/2011  . Hyperlipidemia 04/20/2007  . Essential hypertension 04/20/2007  . Asthma 04/20/2007  . CARPAL TUNNEL SYNDROME, HX OF 04/20/2007    Nicie Milan,CHRIS, PTA 08/18/2020, 4:22 PM  [facility] [place], [name], [address] Phone: [phone]   Fax:  [phone]  Name: Dayzha Pogosyan MRN: [id number] Date of Birth: 08-14-1960

## 2020-08-25 ENCOUNTER — Other Ambulatory Visit: Payer: Self-pay

## 2020-08-25 ENCOUNTER — Ambulatory Visit: Payer: Medicare HMO | Admitting: Physical Therapy

## 2020-08-25 DIAGNOSIS — G8929 Other chronic pain: Secondary | ICD-10-CM

## 2020-08-25 DIAGNOSIS — M545 Low back pain, unspecified: Secondary | ICD-10-CM | POA: Diagnosis not present

## 2020-08-25 NOTE — Therapy (Signed)
[facility] [place], [name], [address] Phone: [phone]   Fax:  [phone]  Physical Therapy Treatment  Patient Details  Name: Teresa Lee MRN: [id number] Date of Birth: [birth year] Referring Provider (PT): Fenton Malling   Encounter Date: 08/25/2020   PT End of Session - 08/25/20 1259    Visit Number 7    Number of Visits 12    Date for PT Re-Evaluation 08/30/20    PT Start Time 1120    PT Stop Time 1215    PT Time Calculation (min) 55 min    Activity Tolerance Patient tolerated treatment well    Behavior During Therapy [facility] for tasks assessed/performed           Past Medical History:  Diagnosis Date  . Arthritis    osteoarthritis  . Asthma   . Blind left eye   . Cancer ([place])    skin  . Crohn's disease ([place])   . CTS (carpal tunnel syndrome)   . DJD (degenerative joint disease)    Both cervical spine and LS spine  . Fibromyalgia   . GERD (gastroesophageal reflux disease)   . Heart murmur   . Herniated nucleus pulposus   . Hyperlipidemia   . Hypertension   . IBS (irritable bowel syndrome)   . Leukocytosis   . Myasthenia gravis   . Myasthenia gravis ([place])   . Myasthenia gravis ([place])   . Pneumonia   . Spinal cord stimulator status     Past Surgical History:  Procedure Laterality Date  . BACK SURGERY    . BIOPSY  05/24/2020   Procedure: BIOPSY;  Surgeon: Ronald Lobo, MD;  Location: [place];  Service: Endoscopy;;  . BREAST REDUCTION SURGERY    . CARPAL TUNNEL RELEASE     bilateral  . cervical disc inj    . CHOLECYSTECTOMY    . COLONOSCOPY WITH PROPOFOL N/A 05/24/2020   Procedure: COLONOSCOPY WITH PROPOFOL;  Surgeon: Ronald Lobo, MD;  Location: [place];  Service: Endoscopy;  Laterality: N/A;  . ENDOMETRIAL ABLATION W/ NOVASURE    . ESOPHAGOGASTRODUODENOSCOPY (EGD) WITH PROPOFOL N/A 05/24/2020   Procedure: ESOPHAGOGASTRODUODENOSCOPY (EGD) WITH PROPOFOL;  Surgeon: Ronald Lobo, MD;  Location: [facility];  Service: Endoscopy;  Laterality: N/A;  . LUMBAR LAMINECTOMY/DECOMPRESSION MICRODISCECTOMY Right 05/04/2020   Procedure: Right Lumbar Five Sacral One Microdiscectomy;  Surgeon: Erline Levine, MD;  Location: [place];  Service: Neurosurgery;  Laterality: Right;  posterior  . MELANOMA EXCISION    . NASAL SINUS SURGERY    . SPINAL CORD STIMULATOR INSERTION  2019  . THYMECTOMY     due to myastenia gravis  . THYMECTOMY    . TRANSFORAMINAL LUMBAR INTERBODY FUSION (TLIF) WITH PEDICLE SCREW FIXATION 1 LEVEL Right 05/04/2020   Procedure: Right Lumbar Four-Five Transforaminal lumbar interbody fusion;  Surgeon: Erline Levine, MD;  Location: [place];  Service: Neurosurgery;  Laterality: Right;  posterior    There were no vitals filed for this visit.   Subjective Assessment - 08/25/20 1252    Subjective COVID-19 screen performed prior to patient entering clinic.  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    Pertinent History OA, blind in left eye, CTS, DJD, Fibromylagia, MG, HTN.. LB surgery 05-04-20 Dr. [name]    How long can you stand comfortably? Varies.    How long can you walk comfortably? Short community distance.    Patient Stated Goals Move and perform ADL's with less pain.  Currently in Pain? Yes    Pain Score 5     Pain Location Back    Pain Orientation Right;Left    Pain Descriptors / Indicators Aching    Pain Type Surgical pain    Pain Onset More than a month ago    Pain Frequency Constant                             [facility] Adult PT Treatment/Exercise - 08/25/20 0001      Exercises   Exercises Knee/Hip      Lumbar Exercises: Aerobic   Nustep Level 3 x 10 minutes.      Modalities   Modalities Electrical Stimulation;Moist Heat      Moist Heat Therapy   Number Minutes Moist Heat 20 Minutes    Moist Heat Location Lumbar Spine      Electrical Stimulation   Electrical Stimulation Location LB    Electrical  Stimulation Action IFC at 80-150 Hz on 40% scan x 20 minutes.    Electrical Stimulation Goals Pain;Tone      Manual Therapy   Manual Therapy Passive ROM;Soft tissue mobilization    Soft tissue mobilization STW/M x 10 minutes to patients affected lumbar musculature.    Passive ROM In supine:  1-1 manual SKTC stretch 2 minutes to each side (4 minutes total).                       PT Long Term Goals - 07/19/20 1128      PT LONG TERM GOAL #1   Title Patient will be independent with HEP    Time 6    Period Weeks    Status New      PT LONG TERM GOAL #2   Title Perform ADL's with pain not > 3-4/10.    Time 6    Period Weeks    Status New      PT LONG TERM GOAL #3   Title Walk a community distance with pain not > 3-4/10.    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  She states she feels therapy is helping.    Personal Factors and Comorbidities Comorbidity 1;Comorbidity 2;Comorbidity 3+;Other    Comorbidities OA, blind in left eye, CTS, DJD, Fibromylagia, MG, HTN.    Examination-Activity Limitations Other    Examination-Participation Restrictions Other    Stability/Clinical Decision Making Stable/Uncomplicated    Rehab Potential Good    PT Frequency 2x / week    PT Duration 6 weeks    PT Treatment/Interventions ADLs/Self Care Home Management;Cryotherapy;Moist Heat;Therapeutic activities;Therapeutic exercise;Manual techniques;Patient/family education;Electrical Stimulation;Ultrasound    PT Next Visit Plan STW/M, core exercise progression per lumbar fusion protocol.    Consulted and Agree with Plan of Care Patient           Patient will benefit from skilled therapeutic intervention in order to improve the following deficits and impairments:  Pain,Decreased activity tolerance,Increased muscle  spasms  Visit Diagnosis: Chronic bilateral low back pain, unspecified whether sciatica present     Problem List Patient Active Problem List   Diagnosis Date Noted  . SIRS (systemic inflammatory response syndrome) ([place]) 06/03/2020  . ARF (  acute renal failure) ([place]) 05/22/2020  . Diarrhea 05/22/2020  . Nausea 05/22/2020  . Spondylolisthesis of lumbar region 05/04/2020  . Chronic pain disorder 08/12/2019  . COVID-19   . Acute on chronic renal failure ([place]) 02/16/2019  . Insomnia disorder 02/27/2018  . Fatigue 02/27/2018  . Fibromyalgia syndrome 04/30/2017  . RLS (restless legs syndrome) 12/26/2016  . B12 deficiency 12/26/2016  . Vitamin D deficiency, unspecified 12/26/2016  . GERD (gastroesophageal reflux disease) 12/26/2016  . Anxiety disorder 12/26/2016  . Hyponatremia 10/24/2016  . AKI (acute kidney injury) ([place]) 10/23/2016  . Hypomagnesemia 10/22/2016  . Hypophosphatemia 10/22/2016  . Hypokalemia 10/21/2016  . Wound infection after surgery 10/21/2016  . Sepsis ([place]) 10/21/2016  . Myasthenia gravis ([place]) 10/21/2016  . Melanoma ([place]) 10/21/2016  . Crohn disease ([place]) 10/21/2016  . Cellulitis 10/21/2016  . Palpitations 08/25/2013  . Leukocytosis 05/25/2011  . Hyperlipidemia 04/20/2007  . Essential hypertension 04/20/2007  . Asthma 04/20/2007  . CARPAL TUNNEL SYNDROME, HX OF 04/20/2007    Jair Lindblad, [place] MPT 08/25/2020, 1:01 PM  [facility] [address] [place], [name], [address] Phone: [phone]   Fax:  [phone]  Name: Teresa Lee MRN: [id number] Date of Birth: 24-Nov-1960

## 2020-09-01 ENCOUNTER — Ambulatory Visit: Payer: Medicare HMO | Admitting: Physical Therapy

## 2020-09-01 ENCOUNTER — Other Ambulatory Visit: Payer: Self-pay

## 2020-09-01 DIAGNOSIS — M545 Low back pain, unspecified: Secondary | ICD-10-CM | POA: Diagnosis not present

## 2020-09-01 DIAGNOSIS — G8929 Other chronic pain: Secondary | ICD-10-CM

## 2020-09-01 NOTE — Therapy (Addendum)
[facility] [place], [name], [address] Phone: [phone]   Fax:  [phone]  Physical Therapy Treatment  Patient Details  Name: Teresa Lee MRN: [id number] Date of Birth: May 17, 1960 Referring Provider (PT): Fenton Malling   Encounter Date: 09/01/2020   PT End of Session - 09/01/20 1121     Visit Number 8    Number of Visits 12    Date for PT Re-Evaluation 09/20/20    PT Start Time 1030    PT Stop Time 6599    PT Time Calculation (min) 53 min    Activity Tolerance Patient tolerated treatment well    Behavior During Therapy [facility] for tasks assessed/performed             Past Medical History:  Diagnosis Date   Arthritis    osteoarthritis   Asthma    Blind left eye    Cancer ([place])    skin   Crohn's disease ([place])    CTS (carpal tunnel syndrome)    DJD (degenerative joint disease)    Both cervical spine and LS spine   Fibromyalgia    GERD (gastroesophageal reflux disease)    Heart murmur    Herniated nucleus pulposus    Hyperlipidemia    Hypertension    IBS (irritable bowel syndrome)    Leukocytosis    Myasthenia gravis    Myasthenia gravis ([place])    Myasthenia gravis ([place])    Pneumonia    Spinal cord stimulator status     Past Surgical History:  Procedure Laterality Date   BACK SURGERY     BIOPSY  05/24/2020   Procedure: BIOPSY;  Surgeon: Ronald Lobo, MD;  Location: [facility];  Service: Endoscopy;;   BREAST REDUCTION SURGERY     CARPAL TUNNEL RELEASE     bilateral   cervical disc inj     CHOLECYSTECTOMY     COLONOSCOPY WITH PROPOFOL N/A 05/24/2020   Procedure: COLONOSCOPY WITH PROPOFOL;  Surgeon: Ronald Lobo, MD;  Location: [place];  Service: Endoscopy;  Laterality: N/A;   ENDOMETRIAL ABLATION W/ NOVASURE     ESOPHAGOGASTRODUODENOSCOPY (EGD) WITH PROPOFOL N/A 05/24/2020   Procedure: ESOPHAGOGASTRODUODENOSCOPY (EGD) WITH PROPOFOL;  Surgeon: Ronald Lobo, MD;  Location: [place];  Service: Endoscopy;  Laterality: N/A;   LUMBAR LAMINECTOMY/DECOMPRESSION MICRODISCECTOMY Right 05/04/2020   Procedure: Right Lumbar Five Sacral One Microdiscectomy;  Surgeon: Erline [place]ine, MD;  Location: [place];  Service: Neurosurgery;  Laterality: Right;  posterior   MELANOMA EXCISION     NASAL SINUS SURGERY     SPINAL CORD STIMULATOR INSERTION  2019   THYMECTOMY     due to myastenia gravis   THYMECTOMY     TRANSFORAMINAL LUMBAR INTERBODY FUSION (TLIF) WITH PEDICLE SCREW FIXATION 1 LEVEL Right 05/04/2020   Procedure: Right Lumbar Four-Five Transforaminal lumbar interbody fusion;  Surgeon: Erline Levine, MD;  Location: [facility];  Service: Neurosurgery;  Laterality: Right;  posterior    There were no vitals filed for this visit.   Subjective Assessment - 09/01/20 1202     Subjective COVID-19 screen performed prior to patient entering clinic.  Doing well.    Pertinent History OA, blind in left eye, CTS, DJD, Fibromylagia, MG, HTN.. LB surgery 05-04-20 Dr. [name]    How long can you stand comfortably? Varies.    How long can you walk comfortably? Short community distance.    Patient Stated Goals Move and perform ADL's with less pain.    Currently in Pain?  Yes    Pain Score 2     Pain Location Back    Pain Orientation Right;Left    Pain Descriptors / Indicators Aching    Pain Type Surgical pain    Pain Onset More than a month ago                               [facility] Adult PT Treatment/Exercise - 09/01/20 0001       Exercises   Exercises Knee/Hip      Lumbar Exercises: Aerobic   Tread Mill 12 minutes at level 4 with SPM at 80+.      Modalities   Modalities Electrical Stimulation;Moist Heat      Moist Heat Therapy   Number Minutes Moist Heat 20 Minutes    Moist Heat Location Lumbar Spine      Electrical Stimulation   Electrical Stimulation Location LB    Electrical Stimulation Action IFC at 80-150 Hz.    Electrical Stimulation Parameters 40% scan x  20 minutes.    Electrical Stimulation Goals Pain;Tone      Manual Therapy   Manual Therapy Passive ROM    Soft tissue mobilization STW/M x 8 minutes to decrease tone of the patient's lumbar musculature.    Passive ROM 1-1 2 minutes SKTC stretch each side (4 minutes total).                         PT Long Term Goals - 07/19/20 1128       PT LONG TERM GOAL #1   Title Patient will be independent with HEP    Time 6    Period Weeks    Status New      PT LONG TERM GOAL #2   Title Perform ADL's with pain not > 3-4/10.    Time 6    Period Weeks    Status New      PT LONG TERM GOAL #3   Title Walk a community distance with pain not > 3-4/10.    Time 6    Period Weeks    Status New                   Plan - 09/01/20 1207     Clinical Impression Statement Excellent progress with a low-level of lumbar pain today.  Good tolerance for ther ex on Nustep with a pace of 80+ SPM.    Personal Factors and Comorbidities Comorbidity 1;Comorbidity 2;Comorbidity 3+;Other    Comorbidities OA, blind in left eye, CTS, DJD, Fibromylagia, MG, HTN.    Examination-Participation Restrictions Other    Stability/Clinical Decision Making Stable/Uncomplicated    Rehab Potential Good    PT Frequency 2x / week    PT Duration 6 weeks    PT Treatment/Interventions ADLs/Self Care Home Management;Cryotherapy;Moist Heat;Therapeutic activities;Therapeutic exercise;Manual techniques;Patient/family education;Electrical Stimulation;Ultrasound    PT Next Visit Plan STW/M, core exercise progression per lumbar fusion protocol.    Consulted and Agree with Plan of Care Patient             Patient will benefit from skilled therapeutic intervention in order to improve the following deficits and impairments:  Pain, Decreased activity tolerance, Increased muscle spasms  Visit Diagnosis: Chronic bilateral low back pain, unspecified whether sciatica present - Plan: PT plan of care  cert/re-cert     Problem List Patient Active Problem List   Diagnosis Date Noted  SIRS (systemic inflammatory response syndrome) ([place]) 06/03/2020   ARF (acute renal failure) ([place]) 05/22/2020   Diarrhea 05/22/2020   Nausea 05/22/2020   Spondylolisthesis of lumbar region 05/04/2020   Chronic pain disorder 08/12/2019   COVID-19    Acute on chronic renal failure ([place]) 02/16/2019   Insomnia disorder 02/27/2018   Fatigue 02/27/2018   Fibromyalgia syndrome 04/30/2017   RLS (restless legs syndrome) 12/26/2016   B12 deficiency 12/26/2016   Vitamin D deficiency, unspecified 12/26/2016   GERD (gastroesophageal reflux disease) 12/26/2016   Anxiety disorder 12/26/2016   Hyponatremia 10/24/2016   AKI (acute kidney injury) ([place]) 10/23/2016   Hypomagnesemia 10/22/2016   Hypophosphatemia 10/22/2016   Hypokalemia 10/21/2016   Wound infection after surgery 10/21/2016   Sepsis ([place]) 10/21/2016   Myasthenia gravis ([place]) 10/21/2016   Melanoma ([place]) 10/21/2016   Crohn disease ([place]) 10/21/2016   Cellulitis 10/21/2016   Palpitations 08/25/2013   Leukocytosis 05/25/2011   Hyperlipidemia 04/20/2007   Essential hypertension 04/20/2007   Asthma 04/20/2007   CARPAL TUNNEL SYNDROME, HX OF 04/20/2007    Rebeka Kimble, [place] MPT 09/01/2020, 12:12 PM  [facility] [address] [place], [name], [address] Phone: [phone]   Fax:  [phone]  Name: Aryel Edelen MRN: [id number] Date of Birth: 1960/06/02

## 2020-09-08 ENCOUNTER — Ambulatory Visit: Payer: Medicare HMO | Admitting: *Deleted

## 2020-09-15 ENCOUNTER — Ambulatory Visit: Payer: Medicare HMO | Admitting: Physical Therapy

## 2020-09-15 ENCOUNTER — Other Ambulatory Visit: Payer: Self-pay

## 2020-09-15 DIAGNOSIS — M545 Low back pain, unspecified: Secondary | ICD-10-CM | POA: Diagnosis not present

## 2020-09-15 DIAGNOSIS — G8929 Other chronic pain: Secondary | ICD-10-CM

## 2020-09-15 NOTE — Therapy (Signed)
[facility] [place], [name], [address] Phone: [phone]   Fax:  [phone]  Physical Therapy Treatment  Patient Details  Name: Teresa Lee MRN: [id number] Date of Birth: 11-18-1960 Referring Provider (PT): Fenton Malling   Encounter Date: 09/15/2020   PT End of Session - 09/15/20 1209     Visit Number 8    Number of Visits 12    Date for PT Re-Evaluation 09/20/20    PT Start Time 1115    PT Stop Time 1202    PT Time Calculation (min) 47 min    Activity Tolerance Patient tolerated treatment well    Behavior During Therapy [facility] for tasks assessed/performed             Past Medical History:  Diagnosis Date   Arthritis    osteoarthritis   Asthma    Blind left eye    Cancer ([place])    skin   Crohn's disease ([place])    CTS (carpal tunnel syndrome)    DJD (degenerative joint disease)    Both cervical spine and LS spine   Fibromyalgia    GERD (gastroesophageal reflux disease)    Heart murmur    Herniated nucleus pulposus    Hyperlipidemia    Hypertension    IBS (irritable bowel syndrome)    Leukocytosis    Myasthenia gravis    Myasthenia gravis ([place])    Myasthenia gravis ([place])    Pneumonia    Spinal cord stimulator status     Past Surgical History:  Procedure Laterality Date   BACK SURGERY     BIOPSY  05/24/2020   Procedure: BIOPSY;  Surgeon: Ronald Lobo, MD;  Location: [place];  Service: Endoscopy;;   BREAST REDUCTION SURGERY     CARPAL TUNNEL RELEASE     bilateral   cervical disc inj     CHOLECYSTECTOMY     COLONOSCOPY WITH PROPOFOL N/A 05/24/2020   Procedure: COLONOSCOPY WITH PROPOFOL;  Surgeon: Ronald Lobo, MD;  Location: [place];  Service: Endoscopy;  Laterality: N/A;   ENDOMETRIAL ABLATION W/ NOVASURE     ESOPHAGOGASTRODUODENOSCOPY (EGD) WITH PROPOFOL N/A 05/24/2020   Procedure: ESOPHAGOGASTRODUODENOSCOPY (EGD) WITH PROPOFOL;  Surgeon: Ronald Lobo, MD;  Location: [place];  Service: Endoscopy;  Laterality: N/A;   LUMBAR LAMINECTOMY/DECOMPRESSION MICRODISCECTOMY Right 05/04/2020   Procedure: Right Lumbar Five Sacral One Microdiscectomy;  Surgeon: Erline Levine, MD;  Location: [place];  Service: Neurosurgery;  Laterality: Right;  posterior   MELANOMA EXCISION     NASAL SINUS SURGERY     SPINAL CORD STIMULATOR INSERTION  2019   THYMECTOMY     due to myastenia gravis   THYMECTOMY     TRANSFORAMINAL LUMBAR INTERBODY FUSION (TLIF) WITH PEDICLE SCREW FIXATION 1 LEVEL Right 05/04/2020   Procedure: Right Lumbar Four-Five Transforaminal lumbar interbody fusion;  Surgeon: Erline Levine, MD;  Location: [place];  Service: Neurosurgery;  Laterality: Right;  posterior    There were no vitals filed for this visit.   Subjective Assessment - 09/15/20 1204     Subjective COVID-19 screen performed prior to patient entering clinic.  Haven't been doing much due to not feeling good.  Doing better now.    Pertinent History OA, blind in left eye, CTS, DJD, Fibromylagia, MG, HTN.. LB surgery 05-04-20 Dr. [name]    How long can you stand comfortably? Varies.    How long can you walk comfortably? Short community distance.    Patient Stated Goals Move and  perform ADL's with less pain.    Currently in Pain? Yes    Pain Score 3     Pain Location Back    Pain Descriptors / Indicators Aching;Other (Comment)    Pain Type Surgical pain    Pain Onset More than a month ago                               [facility] Adult PT Treatment/Exercise - 09/15/20 0001       Exercises   Exercises Knee/Hip      Lumbar Exercises: Aerobic   Nustep Level 3 x 15 minutes.      Modalities   Modalities Electrical Stimulation;Moist Heat      Moist Heat Therapy   Number Minutes Moist Heat 15 Minutes    Moist Heat Location Lumbar Spine      Electrical Stimulation   Electrical Stimulation Location LB    Electrical Stimulation Action IFC at 80-150 Hz.    Electrical Stimulation  Parameters 40% scan x 15 minutes.    Electrical Stimulation Goals Pain;Tone      Manual Therapy   Soft tissue mobilization STW/M x 8 minutes to patient's right lumbar musculature.                         PT Long Term Goals - 07/19/20 1128       PT LONG TERM GOAL #1   Title Patient will be independent with HEP    Time 6    Period Weeks    Status New      PT LONG TERM GOAL #2   Title Perform ADL's with pain not > 3-4/10.    Time 6    Period Weeks    Status New      PT LONG TERM GOAL #3   Title Walk a community distance with pain not > 3-4/10.    Time 6    Period Weeks    Status New                   Plan - 09/15/20 1210     Clinical Impression Statement The patient has been out of therapy for two weeks and has done much.  She had a slight increase in pain today and an active trigger point in her right lumbar musculature that responded well to STW/M.    Personal Factors and Comorbidities Comorbidity 1;Comorbidity 2;Comorbidity 3+;Other    Comorbidities OA, blind in left eye, CTS, DJD, Fibromylagia, MG, HTN.    Examination-Activity Limitations Other    Examination-Participation Restrictions Other    Stability/Clinical Decision Making Stable/Uncomplicated    Rehab Potential Good    PT Frequency 2x / week    PT Duration 6 weeks    PT Treatment/Interventions ADLs/Self Care Home Management;Cryotherapy;Moist Heat;Therapeutic activities;Therapeutic exercise;Manual techniques;Patient/family education;Electrical Stimulation;Ultrasound    PT Next Visit Plan STW/M, core exercise progression per lumbar fusion protocol.    Consulted and Agree with Plan of Care Patient             Patient will benefit from skilled therapeutic intervention in order to improve the following deficits and impairments:  Pain, Decreased activity tolerance, Increased muscle spasms  Visit Diagnosis: Chronic bilateral low back pain, unspecified whether sciatica  present     Problem List Patient Active Problem List   Diagnosis Date Noted   SIRS (systemic inflammatory response syndrome) ([place]) 06/03/2020  ARF (acute renal failure) ([place]) 05/22/2020   Diarrhea 05/22/2020   Nausea 05/22/2020   Spondylolisthesis of lumbar region 05/04/2020   Chronic pain disorder 08/12/2019   COVID-19    Acute on chronic renal failure ([place]) 02/16/2019   Insomnia disorder 02/27/2018   Fatigue 02/27/2018   Fibromyalgia syndrome 04/30/2017   RLS (restless legs syndrome) 12/26/2016   B12 deficiency 12/26/2016   Vitamin D deficiency, unspecified 12/26/2016   GERD (gastroesophageal reflux disease) 12/26/2016   Anxiety disorder 12/26/2016   Hyponatremia 10/24/2016   AKI (acute kidney injury) ([place]) 10/23/2016   Hypomagnesemia 10/22/2016   Hypophosphatemia 10/22/2016   Hypokalemia 10/21/2016   Wound infection after surgery 10/21/2016   Sepsis ([place]) 10/21/2016   Myasthenia gravis ([place]) 10/21/2016   Melanoma ([place]) 10/21/2016   Crohn disease ([place]) 10/21/2016   Cellulitis 10/21/2016   Palpitations 08/25/2013   Leukocytosis 05/25/2011   Hyperlipidemia 04/20/2007   Essential hypertension 04/20/2007   Asthma 04/20/2007   CARPAL TUNNEL SYNDROME, HX OF 04/20/2007    Vonzell Lindblad, [place] MPT 09/15/2020, 12:13 PM  [facility] [address] [place], [name], [address] Phone: [phone]   Fax:  [phone]  Name: Johnye Kist MRN: [id number] Date of Birth: May 11, 1960

## 2020-09-22 ENCOUNTER — Other Ambulatory Visit: Payer: Self-pay

## 2020-09-22 ENCOUNTER — Encounter: Payer: Self-pay | Admitting: *Deleted

## 2020-09-22 ENCOUNTER — Ambulatory Visit: Payer: Medicare HMO | Attending: Neurosurgery | Admitting: *Deleted

## 2020-09-22 DIAGNOSIS — M545 Low back pain, unspecified: Secondary | ICD-10-CM | POA: Diagnosis present

## 2020-09-22 DIAGNOSIS — G8929 Other chronic pain: Secondary | ICD-10-CM

## 2020-09-22 DIAGNOSIS — M797 Fibromyalgia: Secondary | ICD-10-CM | POA: Insufficient documentation

## 2020-09-22 DIAGNOSIS — M5136 Other intervertebral disc degeneration, lumbar region: Secondary | ICD-10-CM | POA: Diagnosis not present

## 2020-09-22 NOTE — Telephone Encounter (Signed)
This encounter was created in error - please disregard.

## 2020-09-22 NOTE — Therapy (Signed)
[name], [name], [address] Phone: [phone]   Fax:  [phone]  Physical Therapy Treatment  Patient Details  Name: Teresa Lee MRN: [id number] Date of Birth: 1960/04/28 Referring Provider (PT): Teresa Lee   Encounter Date: 09/22/2020   PT End of Session - 09/22/20 1248     Visit Number 9    Number of Visits 12    Date for PT Re-Evaluation 09/20/20    PT Start Time 1115    PT Stop Time 1205    PT Time Calculation (min) 50 min             Past Medical History:  Diagnosis Date   Arthritis    osteoarthritis   Asthma    Blind left eye    Cancer ([place])    skin   Crohn's disease ([place])    CTS (carpal tunnel syndrome)    DJD (degenerative joint disease)    Both cervical spine and LS spine   Fibromyalgia    GERD (gastroesophageal reflux disease)    Heart murmur    Herniated nucleus pulposus    Hyperlipidemia    Hypertension    IBS (irritable bowel syndrome)    Leukocytosis    Myasthenia gravis    Myasthenia gravis ([place])    Myasthenia gravis ([place])    Pneumonia    Spinal cord stimulator status     Past Surgical History:  Procedure Laterality Date   BACK SURGERY     BIOPSY  05/24/2020   Procedure: BIOPSY;  Surgeon: Teresa Lobo, MD;  Location: [place];  Service: Endoscopy;;   BREAST REDUCTION SURGERY     CARPAL TUNNEL RELEASE     bilateral   cervical disc inj     CHOLECYSTECTOMY     COLONOSCOPY WITH PROPOFOL N/A 05/24/2020   Procedure: COLONOSCOPY WITH PROPOFOL;  Surgeon: Teresa Lobo, MD;  Location: [place];  Service: Endoscopy;  Laterality: N/A;   ENDOMETRIAL ABLATION W/ NOVASURE     ESOPHAGOGASTRODUODENOSCOPY (EGD) WITH PROPOFOL N/A 05/24/2020   Procedure: ESOPHAGOGASTRODUODENOSCOPY (EGD) WITH PROPOFOL;  Surgeon: Teresa Lobo, MD;  Location: [place];  Service: Endoscopy;  Laterality: N/A;   LUMBAR LAMINECTOMY/DECOMPRESSION MICRODISCECTOMY Right 05/04/2020    Procedure: Right Lumbar Five Sacral One Microdiscectomy;  Surgeon: Teresa Levine, MD;  Location: [place];  Service: Neurosurgery;  Laterality: Right;  posterior   MELANOMA EXCISION     NASAL SINUS SURGERY     SPINAL CORD STIMULATOR INSERTION  2019   THYMECTOMY     due to myastenia gravis   THYMECTOMY     TRANSFORAMINAL LUMBAR INTERBODY FUSION (TLIF) WITH PEDICLE SCREW FIXATION 1 LEVEL Right 05/04/2020   Procedure: Right Lumbar Four-Five Transforaminal lumbar interbody fusion;  Surgeon: Teresa Levine, MD;  Location: [place];  Service: Neurosurgery;  Laterality: Right;  posterior    There were no vitals filed for this visit.   Subjective Assessment - 09/22/20 1239     Subjective COVID-19 screen performed prior to patient entering clinic.  Haven't been doing much due to not feeling good.  Doing better now.    Pertinent History OA, blind in left eye, CTS, DJD, Fibromylagia, MG, HTN.. LB surgery 05-04-20 Teresa Lee    How long can you stand comfortably? Varies.    How long can you walk comfortably? Short community distance.    Patient Stated Goals Move and perform ADL's with less pain.    Currently in Pain? Yes    Pain Score 3  Pain Location Back    Pain Orientation Right    Pain Descriptors / Indicators Aching;Sore    Pain Type Surgical pain    Pain Onset More than a month ago                               [facility] Adult PT Treatment/Exercise - 09/22/20 0001       Exercises   Exercises Knee/Hip;Lumbar      Lumbar Exercises: Aerobic   Nustep Level 3 x 15 minutes.      Modalities   Modalities Electrical Stimulation;Moist Heat      Moist Heat Therapy   Number Minutes Moist Heat 15 Minutes    Moist Heat Location Lumbar Spine      Electrical Stimulation   Electrical Stimulation Location LB    Electrical Stimulation Action IFC x 15 mins    Electrical Stimulation Parameters 80-150hz     Electrical Stimulation Goals Pain;Tone      Manual Therapy   Soft tissue  mobilization STW/M x 10 minutes to patient's right lumbar musculature.                         PT Long Term Goals - 07/19/20 1128       PT LONG TERM GOAL #1   Title Patient will be independent with HEP    Time 6    Period Weeks    Status New      PT LONG TERM GOAL #2   Title Perform ADL's with pain not > 3-4/10.    Time 6    Period Weeks    Status New      PT LONG TERM GOAL #3   Title Walk a community distance with pain not > 3-4/10.    Time 6    Period Weeks    Status New                   Plan - 09/22/20 1244     Clinical Impression Statement Pt arrived today doing fairly well, but very tender along RT side  LB paras. She feels that she is progressing well and ADLs are getting easier. We discussed the importance of good movement patterns to protect fusion. Normal modality response and decreased pain after session    Personal Factors and Comorbidities Comorbidity 1;Comorbidity 2;Comorbidity 3+;Other    Comorbidities OA, blind in left eye, CTS, DJD, Fibromylagia, MG, HTN.    Stability/Clinical Decision Making Stable/Uncomplicated    Rehab Potential Good    PT Frequency 2x / week    PT Duration 6 weeks    PT Treatment/Interventions ADLs/Self Care Home Management;Cryotherapy;Moist Heat;Therapeutic activities;Therapeutic exercise;Manual techniques;Patient/family education;Electrical Stimulation;Ultrasound    PT Next Visit Plan STW/M, core exercise progression per lumbar fusion protocol.             Patient will benefit from skilled therapeutic intervention in order to improve the following deficits and impairments:  Pain, Decreased activity tolerance, Increased muscle spasms  Visit Diagnosis: Chronic bilateral low back pain, unspecified whether sciatica present     Problem List Patient Active Problem List   Diagnosis Date Noted   SIRS (systemic inflammatory response syndrome) ([place]) 06/03/2020   ARF (acute renal failure) ([place]) 05/22/2020    Diarrhea 05/22/2020   Nausea 05/22/2020   Spondylolisthesis of lumbar region 05/04/2020   Chronic pain disorder 08/12/2019   COVID-19    Acute on chronic  renal failure ([place]) 02/16/2019   Insomnia disorder 02/27/2018   Fatigue 02/27/2018   Fibromyalgia syndrome 04/30/2017   RLS (restless legs syndrome) 12/26/2016   B12 deficiency 12/26/2016   Vitamin D deficiency, unspecified 12/26/2016   GERD (gastroesophageal reflux disease) 12/26/2016   Anxiety disorder 12/26/2016   Hyponatremia 10/24/2016   AKI (acute kidney injury) ([place]) 10/23/2016   Hypomagnesemia 10/22/2016   Hypophosphatemia 10/22/2016   Hypokalemia 10/21/2016   Wound infection after surgery 10/21/2016   Sepsis ([place]) 10/21/2016   Myasthenia gravis ([place]) 10/21/2016   Melanoma ([place]) 10/21/2016   Crohn disease ([place]) 10/21/2016   Cellulitis 10/21/2016   Palpitations 08/25/2013   Leukocytosis 05/25/2011   Hyperlipidemia 04/20/2007   Essential hypertension 04/20/2007   Asthma 04/20/2007   CARPAL TUNNEL SYNDROME, HX OF 04/20/2007    Koal Eslinger,CHRIS, PTA 09/22/2020, 12:49 PM  [facility] [address] [place], [name], [address] Phone: [phone]   Fax:  [phone]  Name: Teresa Lee MRN: [id number] Date of Birth: 07-01-1960

## 2020-09-27 ENCOUNTER — Other Ambulatory Visit: Payer: Self-pay | Admitting: Family Medicine

## 2020-09-29 ENCOUNTER — Ambulatory Visit: Payer: Medicare HMO | Admitting: Physical Therapy

## 2020-09-29 ENCOUNTER — Other Ambulatory Visit: Payer: Self-pay

## 2020-09-29 DIAGNOSIS — M545 Low back pain, unspecified: Secondary | ICD-10-CM | POA: Diagnosis not present

## 2020-09-29 DIAGNOSIS — G8929 Other chronic pain: Secondary | ICD-10-CM

## 2020-09-29 NOTE — Therapy (Signed)
[facility] [place], [name], [address] Phone: [phone]   Fax:  [phone]  Physical Therapy Treatment  Patient Details  Name: Teresa Lee MRN: [id number] Date of Birth: 29-Jan-1961 Referring Provider (PT): Fenton Malling   Encounter Date: 09/29/2020   PT End of Session - 09/29/20 0854     Visit Number 10    Number of Visits 12    Date for PT Re-Evaluation 10/04/20    PT Start Time 0816    PT Stop Time 0907    PT Time Calculation (min) 51 min    Activity Tolerance Patient tolerated treatment well    Behavior During Therapy [facility] for tasks assessed/performed             Past Medical History:  Diagnosis Date   Arthritis    osteoarthritis   Asthma    Blind left eye    Cancer ([place])    skin   Crohn's disease ([place])    CTS (carpal tunnel syndrome)    DJD (degenerative joint disease)    Both cervical spine and LS spine   Fibromyalgia    GERD (gastroesophageal reflux disease)    Heart murmur    Herniated nucleus pulposus    Hyperlipidemia    Hypertension    IBS (irritable bowel syndrome)    Leukocytosis    Myasthenia gravis    Myasthenia gravis ([place])    Myasthenia gravis ([place])    Pneumonia    Spinal cord stimulator status     Past Surgical History:  Procedure Laterality Date   BACK SURGERY     BIOPSY  05/24/2020   Procedure: BIOPSY;  Surgeon: Ronald Lobo, MD;  Location: [place];  Service: Endoscopy;;   BREAST REDUCTION SURGERY     CARPAL TUNNEL RELEASE     bilateral   cervical disc inj     CHOLECYSTECTOMY     COLONOSCOPY WITH PROPOFOL N/A 05/24/2020   Procedure: COLONOSCOPY WITH PROPOFOL;  Surgeon: Ronald Lobo, MD;  Location: [place];  Service: Endoscopy;  Laterality: N/A;   ENDOMETRIAL ABLATION W/ NOVASURE     ESOPHAGOGASTRODUODENOSCOPY (EGD) WITH PROPOFOL N/A 05/24/2020   Procedure: ESOPHAGOGASTRODUODENOSCOPY (EGD) WITH PROPOFOL;  Surgeon: Ronald Lobo, MD;  Location: [place];  Service: Endoscopy;  Laterality: N/A;   LUMBAR LAMINECTOMY/DECOMPRESSION MICRODISCECTOMY Right 05/04/2020   Procedure: Right Lumbar Five Sacral One Microdiscectomy;  Surgeon: Erline Levine, MD;  Location: [place];  Service: Neurosurgery;  Laterality: Right;  posterior   MELANOMA EXCISION     NASAL SINUS SURGERY     SPINAL CORD STIMULATOR INSERTION  2019   THYMECTOMY     due to myastenia gravis   THYMECTOMY     TRANSFORAMINAL LUMBAR INTERBODY FUSION (TLIF) WITH PEDICLE SCREW FIXATION 1 LEVEL Right 05/04/2020   Procedure: Right Lumbar Four-Five Transforaminal lumbar interbody fusion;  Surgeon: Erline Levine, MD;  Location: [place];  Service: Neurosurgery;  Laterality: Right;  posterior    There were no vitals filed for this visit.   Subjective Assessment - 09/29/20 0853     Subjective COVID-19 screen performed prior to patient entering clinic.  Doing good.    Pertinent History OA, blind in left eye, CTS, DJD, Fibromylagia, MG, HTN.. LB surgery 05-04-20 Dr. [name]    How long can you stand comfortably? Varies.    How long can you walk comfortably? Short community distance.    Patient Stated Goals Move and perform ADL's with less pain.    Currently in Pain?  Yes    Pain Score 2     Pain Location Back    Pain Descriptors / Indicators Aching;Sore    Pain Type Surgical pain    Pain Onset More than a month ago                               [facility] Adult PT Treatment/Exercise - 09/29/20 0001       Exercises   Exercises Knee/Hip      Lumbar Exercises: Aerobic   Nustep level 3 x 15 minutes.      Modalities   Modalities Electrical Stimulation;Moist Heat      Manual Therapy   Manual Therapy Soft tissue mobilization    Soft tissue mobilization STW/M x 8 minutes with ischemic release technique to right lumbar musculature.                         PT Long Term Goals - 09/29/20 0930       PT LONG TERM GOAL #1   Title Patient will be independent  with HEP    Time 6    Period Weeks    Status New      PT LONG TERM GOAL #2   Title Perform ADL's with pain not > 3-4/10.    Time 6    Period Weeks    Status Achieved      PT LONG TERM GOAL #3   Time 6    Period Weeks    Status On-going                    Patient will benefit from skilled therapeutic intervention in order to improve the following deficits and impairments:     Visit Diagnosis: Chronic bilateral low back pain, unspecified whether sciatica present - Plan: PT plan of care cert/re-cert     Problem List Patient Active Problem List   Diagnosis Date Noted   SIRS (systemic inflammatory response syndrome) ([place]) 06/03/2020   ARF (acute renal failure) ([place]) 05/22/2020   Diarrhea 05/22/2020   Nausea 05/22/2020   Spondylolisthesis of lumbar region 05/04/2020   Chronic pain disorder 08/12/2019   COVID-19    Acute on chronic renal failure ([place]) 02/16/2019   Insomnia disorder 02/27/2018   Fatigue 02/27/2018   Fibromyalgia syndrome 04/30/2017   RLS (restless legs syndrome) 12/26/2016   B12 deficiency 12/26/2016   Vitamin D deficiency, unspecified 12/26/2016   GERD (gastroesophageal reflux disease) 12/26/2016   Anxiety disorder 12/26/2016   Hyponatremia 10/24/2016   AKI (acute kidney injury) ([place]) 10/23/2016   Hypomagnesemia 10/22/2016   Hypophosphatemia 10/22/2016   Hypokalemia 10/21/2016   Wound infection after surgery 10/21/2016   Sepsis ([place]) 10/21/2016   Myasthenia gravis ([place]) 10/21/2016   Melanoma ([place]) 10/21/2016   Crohn disease ([place]) 10/21/2016   Cellulitis 10/21/2016   Palpitations 08/25/2013   Leukocytosis 05/25/2011   Hyperlipidemia 04/20/2007   Essential hypertension 04/20/2007   Asthma 04/20/2007   CARPAL TUNNEL SYNDROME, HX OF 04/20/2007   Progress Note Reporting Period 07/19/20 to 09/29/20.  See note below for Objective Data and Assessment of Progress/Goals.   Good progress with LTG #2 met at this time.      Remington Skalsky, [place]  MPT 09/29/2020, 10:27 AM  [facility] [address] [place], [name], [address] Phone: [phone]   Fax:  [phone]  Name: Teresa Lee MRN: [id number] Date of Birth:  03-07-1961

## 2020-10-05 ENCOUNTER — Ambulatory Visit: Payer: Medicare HMO | Admitting: Physical Therapy

## 2020-10-14 ENCOUNTER — Ambulatory Visit: Payer: Medicare HMO | Admitting: Physical Therapy

## 2020-10-14 ENCOUNTER — Other Ambulatory Visit: Payer: Self-pay

## 2020-10-14 DIAGNOSIS — M545 Low back pain, unspecified: Secondary | ICD-10-CM | POA: Diagnosis not present

## 2020-10-14 DIAGNOSIS — G8929 Other chronic pain: Secondary | ICD-10-CM

## 2020-10-14 NOTE — Therapy (Signed)
[facility] [place], [name], [address] Phone: [phone]   Fax:  [phone]  Physical Therapy Treatment  Patient Details  Name: Teresa Lee MRN: [id number] Date of Birth: 1960-11-24 Referring Provider (PT): Fenton Malling   Encounter Date: 10/14/2020   PT End of Session - 10/14/20 1112     Visit Number 11    Number of Visits 12    Date for PT Re-Evaluation 10/21/20    PT Start Time 0945    PT Stop Time 5681    PT Time Calculation (min) 56 min    Activity Tolerance Patient tolerated treatment well    Behavior During Therapy [facility] for tasks assessed/performed             Past Medical History:  Diagnosis Date   Arthritis    osteoarthritis   Asthma    Blind left eye    Cancer ([place])    skin   Crohn's disease ([place])    CTS (carpal tunnel syndrome)    DJD (degenerative joint disease)    Both cervical spine and LS spine   Fibromyalgia    GERD (gastroesophageal reflux disease)    Heart murmur    Herniated nucleus pulposus    Hyperlipidemia    Hypertension    IBS (irritable bowel syndrome)    Leukocytosis    Myasthenia gravis    Myasthenia gravis ([place])    Myasthenia gravis ([place])    Pneumonia    Spinal cord stimulator status     Past Surgical History:  Procedure Laterality Date   BACK SURGERY     BIOPSY  05/24/2020   Procedure: BIOPSY;  Surgeon: Ronald Lobo, MD;  Location: [place];  Service: Endoscopy;;   BREAST REDUCTION SURGERY     CARPAL TUNNEL RELEASE     bilateral   cervical disc inj     CHOLECYSTECTOMY     COLONOSCOPY WITH PROPOFOL N/A 05/24/2020   Procedure: COLONOSCOPY WITH PROPOFOL;  Surgeon: Ronald Lobo, MD;  Location: [place];  Service: Endoscopy;  Laterality: N/A;   ENDOMETRIAL ABLATION W/ NOVASURE     ESOPHAGOGASTRODUODENOSCOPY (EGD) WITH PROPOFOL N/A 05/24/2020   Procedure: ESOPHAGOGASTRODUODENOSCOPY (EGD) WITH PROPOFOL;  Surgeon: Ronald Lobo, MD;  Location: [place];  Service: Endoscopy;  Laterality: N/A;   LUMBAR LAMINECTOMY/DECOMPRESSION MICRODISCECTOMY Right 05/04/2020   Procedure: Right Lumbar Five Sacral One Microdiscectomy;  Surgeon: Erline Levine, MD;  Location: [place];  Service: Neurosurgery;  Laterality: Right;  posterior   MELANOMA EXCISION     NASAL SINUS SURGERY     SPINAL CORD STIMULATOR INSERTION  2019   THYMECTOMY     due to myastenia gravis   THYMECTOMY     TRANSFORAMINAL LUMBAR INTERBODY FUSION (TLIF) WITH PEDICLE SCREW FIXATION 1 LEVEL Right 05/04/2020   Procedure: Right Lumbar Four-Five Transforaminal lumbar interbody fusion;  Surgeon: Erline Levine, MD;  Location: [place];  Service: Neurosurgery;  Laterality: Right;  posterior    There were no vitals filed for this visit.   Subjective Assessment - 10/14/20 1111     Subjective COVID-19 screen performed prior to patient entering clinic.  Doing good today but the other day I stood too long and had pain across my low back.  Some tingling in right foot.    Pertinent History OA, blind in left eye, CTS, DJD, Fibromylagia, MG, HTN.. LB surgery 05-04-20 Dr. [name]    How long can you stand comfortably? Varies.    Currently in Pain? Yes  Pain Score 2     Pain Location Back    Pain Orientation Right    Pain Descriptors / Indicators Aching;Dull    Pain Type Surgical pain    Pain Onset More than a month ago                               [facility] Adult PT Treatment/Exercise - 10/14/20 0001       Exercises   Exercises Knee/Hip      Lumbar Exercises: Aerobic   Nustep Level 4 x 15 minutes.      Modalities   Modalities Electrical Stimulation;Moist Heat      Moist Heat Therapy   Number Minutes Moist Heat 20 Minutes    Moist Heat Location Lumbar Spine      Electrical Stimulation   Electrical Stimulation Location RT LB    Electrical Stimulation Action Pre-mod.    Electrical Stimulation Parameters 80-150 hz x 20 minutes.    Electrical Stimulation Goals  Pain;Tone      Manual Therapy   Manual Therapy Soft tissue mobilization    Soft tissue mobilization STW/M x 9 minutes to patient's right low back with ischemic release technique utilized.                         PT Long Term Goals - 09/29/20 0930       PT LONG TERM GOAL #1   Title Patient will be independent with HEP    Time 6    Period Weeks    Status New      PT LONG TERM GOAL #2   Title Perform ADL's with pain not > 3-4/10.    Time 6    Period Weeks    Status Achieved      PT LONG TERM GOAL #3   Time 6    Period Weeks    Status On-going                   Plan - 10/14/20 1141     Clinical Impression Statement Patient overall doing much better.  We reviews SKTC, hip bridges and mini-crunches today.  She felt good after treatment.    Personal Factors and Comorbidities Comorbidity 1;Comorbidity 2;Comorbidity 3+;Other    Comorbidities OA, blind in left eye, CTS, DJD, Fibromylagia, MG, HTN.    Examination-Activity Limitations Other    Examination-Participation Restrictions Other    Stability/Clinical Decision Making Stable/Uncomplicated    Rehab Potential Good    PT Frequency 2x / week    PT Duration 6 weeks    PT Treatment/Interventions ADLs/Self Care Home Management;Cryotherapy;Moist Heat;Therapeutic activities;Therapeutic exercise;Manual techniques;Patient/family education;Electrical Stimulation;Ultrasound    PT Next Visit Plan D/c next visit.    Consulted and Agree with Plan of Care Patient             Patient will benefit from skilled therapeutic intervention in order to improve the following deficits and impairments:  Pain, Decreased activity tolerance, Increased muscle spasms  Visit Diagnosis: Chronic bilateral low back pain, unspecified whether sciatica present - Plan: PT plan of care cert/re-cert     Problem List Patient Active Problem List   Diagnosis Date Noted   SIRS (systemic inflammatory response syndrome) ([place])  06/03/2020   ARF (acute renal failure) ([place]) 05/22/2020   Diarrhea 05/22/2020   Nausea 05/22/2020   Spondylolisthesis of lumbar region 05/04/2020   Chronic pain disorder 08/12/2019  COVID-19    Acute on chronic renal failure (HCC) 02/16/2019   Insomnia disorder 02/27/2018   Fatigue 02/27/2018   Fibromyalgia syndrome 04/30/2017   RLS (restless legs syndrome) 12/26/2016   B12 deficiency 12/26/2016   Vitamin D deficiency, unspecified 12/26/2016   GERD (gastroesophageal reflux disease) 12/26/2016   Anxiety disorder 12/26/2016   Hyponatremia 10/24/2016   AKI (acute kidney injury) ([place]) 10/23/2016   Hypomagnesemia 10/22/2016   Hypophosphatemia 10/22/2016   Hypokalemia 10/21/2016   Wound infection after surgery 10/21/2016   Sepsis ([place]) 10/21/2016   Myasthenia gravis ([place]) 10/21/2016   Melanoma ([place]) 10/21/2016   Crohn disease ([place]) 10/21/2016   Cellulitis 10/21/2016   Palpitations 08/25/2013   Leukocytosis 05/25/2011   Hyperlipidemia 04/20/2007   Essential hypertension 04/20/2007   Asthma 04/20/2007   CARPAL TUNNEL SYNDROME, HX OF 04/20/2007    Kazaria Gaertner, [place] MPT 10/14/2020, 11:50 AM  [facility] [address] [place], [name], [address] Phone: [phone]   Fax:  [phone]  Name: Claressa Hughley MRN: [id number] Date of Birth: 04-27-1960

## 2020-10-19 ENCOUNTER — Ambulatory Visit
Admission: RE | Admit: 2020-10-19 | Discharge: 2020-10-19 | Disposition: A | Discharge status: Home / Self Care | Payer: Medicare HMO | Source: Ambulatory Visit | Attending: Physician Assistant | Admitting: Physician Assistant

## 2020-10-19 ENCOUNTER — Other Ambulatory Visit: Payer: Self-pay | Admitting: Physician Assistant

## 2020-10-19 DIAGNOSIS — M79671 Pain in right foot: Secondary | ICD-10-CM

## 2020-10-21 ENCOUNTER — Ambulatory Visit: Payer: Medicare HMO | Attending: Neurosurgery | Admitting: Physical Therapy

## 2020-10-21 ENCOUNTER — Other Ambulatory Visit: Payer: Self-pay

## 2020-10-21 DIAGNOSIS — G8929 Other chronic pain: Secondary | ICD-10-CM | POA: Insufficient documentation

## 2020-10-21 DIAGNOSIS — M545 Low back pain, unspecified: Secondary | ICD-10-CM | POA: Insufficient documentation

## 2020-10-21 DIAGNOSIS — M5136 Other intervertebral disc degeneration, lumbar region: Secondary | ICD-10-CM | POA: Diagnosis not present

## 2020-10-21 NOTE — Therapy (Signed)
[facility] [place], [name], [address] Phone: [phone]   Fax:  [phone]  Physical Therapy Treatment  Patient Details  Name: Teresa Lee MRN: [id number] Date of Birth: [birth year] Referring Provider (PT): Fenton Malling   Encounter Date: 10/21/2020   PT End of Session - 10/21/20 1040     Visit Number 12    Number of Visits 12    Date for PT Re-Evaluation 10/21/20    PT Start Time 1034    PT Stop Time 1129    PT Time Calculation (min) 55 min    Activity Tolerance Patient tolerated treatment well    Behavior During Therapy [facility] for tasks assessed/performed             Past Medical History:  Diagnosis Date   Arthritis    osteoarthritis   Asthma    Blind left eye    Cancer ([place])    skin   Crohn's disease ([place])    CTS (carpal tunnel syndrome)    DJD (degenerative joint disease)    Both cervical spine and LS spine   Fibromyalgia    GERD (gastroesophageal reflux disease)    Heart murmur    Herniated nucleus pulposus    Hyperlipidemia    Hypertension    IBS (irritable bowel syndrome)    Leukocytosis    Myasthenia gravis    Myasthenia gravis ([place])    Myasthenia gravis ([place])    Pneumonia    Spinal cord stimulator status     Past Surgical History:  Procedure Laterality Date   BACK SURGERY     BIOPSY  05/24/2020   Procedure: BIOPSY;  Surgeon: Ronald Lobo, MD;  Location: [place];  Service: Endoscopy;;   BREAST REDUCTION SURGERY     CARPAL TUNNEL RELEASE     bilateral   cervical disc inj     CHOLECYSTECTOMY     COLONOSCOPY WITH PROPOFOL N/A 05/24/2020   Procedure: COLONOSCOPY WITH PROPOFOL;  Surgeon: Ronald Lobo, MD;  Location: [place];  Service: Endoscopy;  Laterality: N/A;   ENDOMETRIAL ABLATION W/ NOVASURE     ESOPHAGOGASTRODUODENOSCOPY (EGD) WITH PROPOFOL N/A 05/24/2020   Procedure: ESOPHAGOGASTRODUODENOSCOPY (EGD) WITH PROPOFOL;  Surgeon: Ronald Lobo, MD;  Location: [place];  Service: Endoscopy;  Laterality: N/A;   LUMBAR LAMINECTOMY/DECOMPRESSION MICRODISCECTOMY Right 05/04/2020   Procedure: Right Lumbar Five Sacral One Microdiscectomy;  Surgeon: Erline Levine, MD;  Location: [place];  Service: Neurosurgery;  Laterality: Right;  posterior   MELANOMA EXCISION     NASAL SINUS SURGERY     SPINAL CORD STIMULATOR INSERTION  2019   THYMECTOMY     due to myastenia gravis   THYMECTOMY     TRANSFORAMINAL LUMBAR INTERBODY FUSION (TLIF) WITH PEDICLE SCREW FIXATION 1 LEVEL Right 05/04/2020   Procedure: Right Lumbar Four-Five Transforaminal lumbar interbody fusion;  Surgeon: Erline Levine, MD;  Location: [place];  Service: Neurosurgery;  Laterality: Right;  posterior    There were no vitals filed for this visit.   Subjective Assessment - 10/21/20 1037     Subjective COVID-19 screen performed prior to patient entering clinic.  Patient arrived doing well. Minimal pain today.    Pertinent History OA, blind in left eye, CTS, DJD, Fibromylagia, MG, HTN.. LB surgery 05-04-20 Dr. [name]    How long can you stand comfortably? Varies.    How long can you walk comfortably? Short community distance.    Patient Stated Goals Move and perform ADL's with less pain.  [facility] Adult PT Treatment/Exercise - 10/21/20 0001       Self-Care   Self-Care ADL's;Lifting;Posture;Other Self-Care Comments    Other Self-Care Comments  HEP provided for all above      Lumbar Exercises: Aerobic   Nustep Level 4 x 15 minutes.      Moist Heat Therapy   Number Minutes Moist Heat 15 Minutes    Moist Heat Location Lumbar Spine      Electrical Stimulation   Electrical Stimulation Location Rt low back    Electrical Stimulation Action premod    Electrical Stimulation Parameters 80-_0  x19mn    Electrical Stimulation Goals Pain;Tone      Manual Therapy   Manual Therapy Soft tissue mobilization    Passive ROM manual STW to rt low back to  reduce pain and tone                    PT Education - 10/21/20 1054     Education Details HEP abdominal bracing and posture awareness techniques    Person(s) Educated Patient    Methods Explanation;Demonstration;Handout    Comprehension Verbalized understanding;Returned demonstration                 PT Long Term Goals - 10/21/20 1041       PT LONG TERM GOAL #1   Title Patient will be independent with HEP    Baseline issued 10/21/20    Time 6    Period Weeks    Status Achieved      PT LONG TERM GOAL #2   Title Perform ADL's with pain not > 3-4/10.    Baseline met 1-2/10 reported 10/21/20    Time 6    Period Weeks    Status Achieved      PT LONG TERM GOAL #3   Title Walk a community distance with pain not > 3-4/10.    Baseline 1-2/10 pain 10/21/20    Time 6    Period Weeks    Status Achieved                   Plan - 10/21/20 1120     Clinical Impression Statement Patient tolerated treatment well today. Patient reported 1-2/10pain at most and able to perfrom ADL's and walking with greater ease. Patient was educated on posture awareness techniques and abdominal bracing with HEP provided. Patient has met all current goals and DC today per PT    Personal Factors and Comorbidities Comorbidity 1;Comorbidity 2;Comorbidity 3+;Other    Comorbidities OA, blind in left eye, CTS, DJD, Fibromylagia, MG, HTN.    Examination-Activity Limitations Other    Examination-Participation Restrictions Other    Stability/Clinical Decision Making Stable/Uncomplicated    Rehab Potential Good    PT Frequency 2x / week    PT Duration 6 weeks    PT Treatment/Interventions ADLs/Self Care Home Management;Cryotherapy;Moist Heat;Therapeutic activities;Therapeutic exercise;Manual techniques;Patient/family education;Electrical Stimulation;Ultrasound    PT Next Visit Plan DC    Consulted and Agree with Plan of Care Patient             Patient will benefit from skilled  therapeutic intervention in order to improve the following deficits and impairments:  Pain, Decreased activity tolerance, Increased muscle spasms  Visit Diagnosis: Chronic bilateral low back pain, unspecified whether sciatica present     Problem List Patient Active Problem List   Diagnosis Date Noted   SIRS (systemic inflammatory response syndrome) ([place] 06/03/2020   ARF (acute renal failure) ([place] 05/22/2020  Diarrhea 05/22/2020   Nausea 05/22/2020   Spondylolisthesis of lumbar region 05/04/2020   Chronic pain disorder 08/12/2019   COVID-19    Acute on chronic renal failure ([place]) 02/16/2019   Insomnia disorder 02/27/2018   Fatigue 02/27/2018   Fibromyalgia syndrome 04/30/2017   RLS (restless legs syndrome) 12/26/2016   B12 deficiency 12/26/2016   Vitamin D deficiency, unspecified 12/26/2016   GERD (gastroesophageal reflux disease) 12/26/2016   Anxiety disorder 12/26/2016   Hyponatremia 10/24/2016   AKI (acute kidney injury) ([place]) 10/23/2016   Hypomagnesemia 10/22/2016   Hypophosphatemia 10/22/2016   Hypokalemia 10/21/2016   Wound infection after surgery 10/21/2016   Sepsis ([place]) 10/21/2016   Myasthenia gravis ([place]) 10/21/2016   Melanoma ([place]) 10/21/2016   Crohn disease ([place]) 10/21/2016   Cellulitis 10/21/2016   Palpitations 08/25/2013   Leukocytosis 05/25/2011   Hyperlipidemia 04/20/2007   Essential hypertension 04/20/2007   Asthma 04/20/2007   CARPAL TUNNEL SYNDROME, HX OF 04/20/2007    Ladean Raya, PTA 10/21/20 11:34 AM   [facility] [address] [place], [name], [address] Phone: [phone]   Fax:  [phone]  Name: Teresa Lee MRN: [id number] Date of Birth: 1960/09/07   PHYSICAL THERAPY DISCHARGE SUMMARY  Visits from Start of Care: 12.  Current functional level related to goals / functional outcomes: See above.   Remaining deficits: All goals met.   Education / Equipment: HEP.    Patient agrees to discharge. Patient goals were met. Patient is being discharged due to being pleased with the current functional level.     Mali Applegate MPT

## 2020-10-21 NOTE — Patient Instructions (Signed)
Pelvic Tilt: Posterior - Legs Bent (Supine)  Tighten stomach and flatten back by rolling pelvis down. Hold _10___ seconds. Relax. Repeat _10-30___ times per set. Do __2__ sets per session. Do _2___ sessions per day.   Bent Leg Lift (Hook-Lying)  Tighten stomach and slowly raise right leg _5___ inches from floor. Keep trunk rigid. Hold _3___ seconds. Repeat _10___ times per set. Do ___2-3_ sets per session. Do __2__ sessions per day.  Brushing Teeth    Place one foot on ledge and one hand on counter. Bend other knee slightly to keep back straight.  Copyright  VHI. All rights reserved.  Refrigerator   Squat with knees apart to reach lower shelves and drawers.   Copyright  VHI. All rights reserved.  Laundry [employer] down and hold basket close to stand. Use leg muscles to do the work.   Copyright  VHI. All rights reserved.  Housework - Vacuuming   Hold the vacuum with arm held at side. Step back and forth to move it, keeping head up. Avoid twisting.   Copyright  VHI. All rights reserved.  Housework - Wiping   Position yourself as close as possible to reach work surface. Avoid straining your back.   Copyright  VHI. All rights reserved.  Gardening - Mowing   Keep arms close to sides and walk with lawn mower.   Copyright  VHI. All rights reserved.  Sleeping on Side   Place pillow between knees. Use cervical support under neck and a roll around waist as needed.   Copyright  VHI. All rights reserved.  Log Roll   Lying on back, bend left knee and place left arm across chest. Roll all in one movement to the right. Reverse to roll to the left. Always move as one unit.   Copyright  VHI. All rights reserved.  Stand to Sit / Sit to Stand   To sit: Bend knees to lower self onto front edge of chair, then scoot back on seat. To stand: Reverse sequence by placing one foot forward, and scoot to front of seat. Use rocking motion to stand  up.  Copyright  VHI. All rights reserved.  Posture - Standing   Good posture is important. Avoid slouching and forward head thrust. Maintain curve in low back and align ears over shoul- ders, hips over ankles.   Copyright  VHI. All rights reserved.  Posture - Sitting   Sit upright, head facing forward. Try using a roll to support lower back. Keep shoulders relaxed, and avoid rounded back. Keep hips level with knees. Avoid crossing legs for long periods.   Copyright  VHI. All rights reserved.  Computer Work   Position work to Programmer, multimedia. Use proper work and seat height. Keep shoulders back and down, wrists straight, and elbows at right angles. Use chair that provides full back support. Add footrest and lumbar roll as needed.   Copyright  VHI. All rights reserved.

## 2021-02-21 ENCOUNTER — Other Ambulatory Visit: Payer: Self-pay

## 2021-02-21 ENCOUNTER — Ambulatory Visit: Payer: Medicare HMO | Attending: Neurosurgery | Admitting: Physical Therapy

## 2021-02-21 DIAGNOSIS — G8929 Other chronic pain: Secondary | ICD-10-CM | POA: Insufficient documentation

## 2021-02-21 DIAGNOSIS — M5416 Radiculopathy, lumbar region: Secondary | ICD-10-CM | POA: Diagnosis not present

## 2021-02-21 DIAGNOSIS — M545 Low back pain, unspecified: Secondary | ICD-10-CM | POA: Diagnosis present

## 2021-02-21 NOTE — Therapy (Signed)
[facility] [place], [name], [address] Phone: [phone]   Fax:  [phone]  Physical Therapy Evaluation  Patient Details  Name: Teresa Lee MRN: [id number] Date of Birth: [birth year] Referring Provider (PT): Fenton Malling NP   Encounter Date: 02/21/2021   PT End of Session - 02/21/21 1239     Visit Number 1    Number of Visits 6    Date for PT Re-Evaluation 03/21/21    PT Start Time 0945    PT Stop Time 1027    PT Time Calculation (min) 42 min    Activity Tolerance Patient tolerated treatment well    Behavior During Therapy [facility] for tasks assessed/performed             Past Medical History:  Diagnosis Date   Arthritis    osteoarthritis   Asthma    Blind left eye    Cancer ([place])    skin   Crohn's disease ([place])    CTS (carpal tunnel syndrome)    DJD (degenerative joint disease)    Both cervical spine and LS spine   Fibromyalgia    GERD (gastroesophageal reflux disease)    Heart murmur    Herniated nucleus pulposus    Hyperlipidemia    Hypertension    IBS (irritable bowel syndrome)    Leukocytosis    Myasthenia gravis    Myasthenia gravis ([place])    Myasthenia gravis ([place])    Pneumonia    Spinal cord stimulator status     Past Surgical History:  Procedure Laterality Date   BACK SURGERY     BIOPSY  05/24/2020   Procedure: BIOPSY;  Surgeon: Ronald Lobo, MD;  Location: [place];  Service: Endoscopy;;   BREAST REDUCTION SURGERY     CARPAL TUNNEL RELEASE     bilateral   cervical disc inj     CHOLECYSTECTOMY     COLONOSCOPY WITH PROPOFOL N/A 05/24/2020   Procedure: COLONOSCOPY WITH PROPOFOL;  Surgeon: Ronald Lobo, MD;  Location: [place];  Service: Endoscopy;  Laterality: N/A;   ENDOMETRIAL ABLATION W/ NOVASURE     ESOPHAGOGASTRODUODENOSCOPY (EGD) WITH PROPOFOL N/A 05/24/2020   Procedure: ESOPHAGOGASTRODUODENOSCOPY (EGD) WITH PROPOFOL;  Surgeon: Ronald Lobo, MD;  Location:  [place];  Service: Endoscopy;  Laterality: N/A;   LUMBAR LAMINECTOMY/DECOMPRESSION MICRODISCECTOMY Right 05/04/2020   Procedure: Right Lumbar Five Sacral One Microdiscectomy;  Surgeon: Erline Levine, MD;  Location: [place];  Service: Neurosurgery;  Laterality: Right;  posterior   MELANOMA EXCISION     NASAL SINUS SURGERY     SPINAL CORD STIMULATOR INSERTION  2019   THYMECTOMY     due to myastenia gravis   THYMECTOMY     TRANSFORAMINAL LUMBAR INTERBODY FUSION (TLIF) WITH PEDICLE SCREW FIXATION 1 LEVEL Right 05/04/2020   Procedure: Right Lumbar Four-Five Transforaminal lumbar interbody fusion;  Surgeon: Erline Levine, MD;  Location: [place];  Service: Neurosurgery;  Laterality: Right;  posterior    There were no vitals filed for this visit.    Subjective Assessment - 02/21/21 1241     Subjective COVID-19 screen performed prior to patient entering clinic.  The patient returns to PT today with c/o low back pain, returning left thigh numbness and now numbness and tingling over three of the toes of her right foot.  Her pain-level is rated at a 6/10 today and can go higher with bending.  She underwent a L4-5 TLIF on 05/04/20.    Pertinent History Fibromyalgia, MG, HTN, CTS,  DJD, OA, left eye blindness.    Patient Stated Goals The patient wants to find out why her symptoms are returning and why her right foot is tingling and numb.    Currently in Pain? Yes    Pain Score 6     Pain Location Back    Pain Orientation Right;Left    Pain Descriptors / Indicators Aching    Pain Type Chronic pain    Pain Radiating Towards Left anterior thigh and 2nd, 3rd and 4th toes of right foot.    Pain Onset More than a month ago    Pain Frequency Constant    Aggravating Factors  Bending.    Pain Relieving Factors Resting.                [facility] PT Assessment - 02/21/21 0001       Assessment   Medical Diagnosis Lumbar radiculopathy.    Referring Provider (PT) Fenton Malling NP    Onset Date/Surgical  Date --   Ongoing.     Precautions   Precaution Comments Lumbar fusion.      Restrictions   Weight Bearing Restrictions No      Balance Screen   Has the patient fallen in the past 6 months No    Has the patient had a decrease in activity level because of a fear of falling?  No    Is the patient reluctant to leave their home because of a fear of falling?  No      Home Ecologist residence      Prior Function   Level of Independence Independent      Posture/Postural Control   Posture Comments Generall good posture.      Deep Tendon Reflexes   DTR Assessment Site Patella;Achilles    Patella DTR 2+    Achilles DTR 2+      ROM / Strength   AROM / PROM / Strength AROM;Strength      AROM   Overall AROM Comments Normal bilateral hip flexion assessed in supine.      Strength   Overall Strength Comments Normal bilateral knee and ankel strength.      Palpation   Palpation comment Patient c/o pain across her lower lumbar-sacral region bilaterally.      Special Tests   Other special tests Equal leg lengths.  (+) bilateral SLR testing.      Ambulation/Gait   Gait Comments Essentially normal gait pattern.                        Objective measurements completed on examination: See above findings.       [facility] Adult PT Treatment/Exercise - 02/21/21 0001       Modalities   Modalities Electrical Stimulation;Moist Heat      Moist Heat Therapy   Number Minutes Moist Heat 15 Minutes    Moist Heat Location Lumbar Spine      Electrical Stimulation   Electrical Stimulation Location L-S region.    Electrical Stimulation Action IFC at 80-150 Hz.    Electrical Stimulation Parameters 40% scan x 15 minutes.    Electrical Stimulation Goals Pain                          PT Long Term Goals - 02/21/21 1259       PT LONG TERM GOAL #1   Title Patient will be independent with HEP  Time 6    Period Weeks    Status New       PT LONG TERM GOAL #2   Title Perform ADL's with pain not > 3-4/10.    Time 6    Period Weeks    Status New      PT LONG TERM GOAL #3   Title Eliminate LE symptoms.    Time 6    Period Weeks    Status New                    Plan - 02/21/21 1255     Clinical Impression Statement The patient returns to OPPT with continued low back pain reported in her lumbo-sacrall region.  She reports she is experiencing a return of her left anterior thigh numbness and and tingling and numbness over the 2nd to 4th toes of her right foot.  Bilateral SLR testing in positive.  Her pain is increased with ADL's especially when bending.  Her LE DTR's are normal and her bilateral knee and ankle strength is normal as well.    Personal Factors and Comorbidities Other    Examination-Activity Limitations Other    Examination-Participation Restrictions Other    Stability/Clinical Decision Making Evolving/Moderate complexity    Clinical Decision Making Low    Rehab Potential Fair    PT Frequency --   6 visits.   PT Treatment/Interventions ADLs/Self Care Home Management;Cryotherapy;Electrical Stimulation;Ultrasound;Moist Heat;Functional mobility training;Therapeutic activities;Therapeutic exercise;Manual techniques;Passive range of motion    PT Next Visit Plan Combo e'stim/US and STW/M to patient's bilateral L-S region, core exercise progression.    Consulted and Agree with Plan of Care Patient             Patient will benefit from skilled therapeutic intervention in order to improve the following deficits and impairments:  Pain, Decreased activity tolerance  Visit Diagnosis: Chronic bilateral low back pain, unspecified whether sciatica present - Plan: PT plan of care cert/re-cert     Problem List Patient Active Problem List   Diagnosis Date Noted   SIRS (systemic inflammatory response syndrome) ([place]) 06/03/2020   ARF (acute renal failure) ([place]) 05/22/2020   Diarrhea 05/22/2020    Nausea 05/22/2020   Spondylolisthesis of lumbar region 05/04/2020   Chronic pain disorder 08/12/2019   COVID-19    Acute on chronic renal failure ([place]) 02/16/2019   Insomnia disorder 02/27/2018   Fatigue 02/27/2018   Fibromyalgia syndrome 04/30/2017   RLS (restless legs syndrome) 12/26/2016   B12 deficiency 12/26/2016   Vitamin D deficiency, unspecified 12/26/2016   GERD (gastroesophageal reflux disease) 12/26/2016   Anxiety disorder 12/26/2016   Hyponatremia 10/24/2016   AKI (acute kidney injury) ([place]) 10/23/2016   Hypomagnesemia 10/22/2016   Hypophosphatemia 10/22/2016   Hypokalemia 10/21/2016   Wound infection after surgery 10/21/2016   Sepsis ([place]) 10/21/2016   Myasthenia gravis ([place]) 10/21/2016   Melanoma ([place]) 10/21/2016   Crohn disease ([place]) 10/21/2016   Cellulitis 10/21/2016   Palpitations 08/25/2013   Leukocytosis 05/25/2011   Hyperlipidemia 04/20/2007   Essential hypertension 04/20/2007   Asthma 04/20/2007   CARPAL TUNNEL SYNDROME, HX OF 04/20/2007    Earnie Bechard, [place], PT 02/21/2021, 1:06 PM  [facility] [address] [place], [name], [address] Phone: [phone]   Fax:  [phone]  Name: George Alcantar MRN: [id number] Date of Birth: 01-16-1961

## 2021-02-28 ENCOUNTER — Other Ambulatory Visit: Payer: Self-pay

## 2021-02-28 ENCOUNTER — Ambulatory Visit: Payer: Medicare HMO | Admitting: Physical Therapy

## 2021-02-28 ENCOUNTER — Encounter: Payer: Self-pay | Admitting: Physical Therapy

## 2021-02-28 DIAGNOSIS — G8929 Other chronic pain: Secondary | ICD-10-CM

## 2021-02-28 DIAGNOSIS — M5416 Radiculopathy, lumbar region: Secondary | ICD-10-CM | POA: Diagnosis not present

## 2021-02-28 DIAGNOSIS — M545 Low back pain, unspecified: Secondary | ICD-10-CM

## 2021-02-28 NOTE — Therapy (Signed)
[facility] [place], [name], [address] Phone: [phone]   Fax:  [phone]  Physical Therapy Treatment  Patient Details  Name: Teresa Lee MRN: [id number] Date of Birth: [birth year] Referring Provider (PT): Fenton Malling NP   Encounter Date: 02/28/2021   PT End of Session - 02/28/21 1109     Visit Number 2    Number of Visits 6    Date for PT Re-Evaluation 03/21/21    PT Start Time 1122    PT Stop Time 1203    PT Time Calculation (min) 41 min    Activity Tolerance Patient tolerated treatment well    Behavior During Therapy [facility] for tasks assessed/performed             Past Medical History:  Diagnosis Date   Arthritis    osteoarthritis   Asthma    Blind left eye    Cancer ([place])    skin   Crohn's disease ([place])    CTS (carpal tunnel syndrome)    DJD (degenerative joint disease)    Both cervical spine and LS spine   Fibromyalgia    GERD (gastroesophageal reflux disease)    Heart murmur    Herniated nucleus pulposus    Hyperlipidemia    Hypertension    IBS (irritable bowel syndrome)    Leukocytosis    Myasthenia gravis    Myasthenia gravis ([place])    Myasthenia gravis ([place])    Pneumonia    Spinal cord stimulator status     Past Surgical History:  Procedure Laterality Date   BACK SURGERY     BIOPSY  05/24/2020   Procedure: BIOPSY;  Surgeon: Ronald Lobo, MD;  Location: [place];  Service: Endoscopy;;   BREAST REDUCTION SURGERY     CARPAL TUNNEL RELEASE     bilateral   cervical disc inj     CHOLECYSTECTOMY     COLONOSCOPY WITH PROPOFOL N/A 05/24/2020   Procedure: COLONOSCOPY WITH PROPOFOL;  Surgeon: Ronald Lobo, MD;  Location: [place];  Service: Endoscopy;  Laterality: N/A;   ENDOMETRIAL ABLATION W/ NOVASURE     ESOPHAGOGASTRODUODENOSCOPY (EGD) WITH PROPOFOL N/A 05/24/2020   Procedure: ESOPHAGOGASTRODUODENOSCOPY (EGD) WITH PROPOFOL;  Surgeon: Ronald Lobo, MD;  Location:  [place];  Service: Endoscopy;  Laterality: N/A;   LUMBAR LAMINECTOMY/DECOMPRESSION MICRODISCECTOMY Right 05/04/2020   Procedure: Right Lumbar Five Sacral One Microdiscectomy;  Surgeon: Erline Levine, MD;  Location: [place];  Service: Neurosurgery;  Laterality: Right;  posterior   MELANOMA EXCISION     NASAL SINUS SURGERY     SPINAL CORD STIMULATOR INSERTION  2019   THYMECTOMY     due to myastenia gravis   THYMECTOMY     TRANSFORAMINAL LUMBAR INTERBODY FUSION (TLIF) WITH PEDICLE SCREW FIXATION 1 LEVEL Right 05/04/2020   Procedure: Right Lumbar Four-Five Transforaminal lumbar interbody fusion;  Surgeon: Erline Levine, MD;  Location: [place];  Service: Neurosurgery;  Laterality: Right;  posterior    There were no vitals filed for this visit.   Subjective Assessment - 02/28/21 1109     Subjective COVID-19 screen performed prior to patient entering clinic. Reports same symptoms.    Pertinent History Fibromyalgia, MG, HTN, CTS, DJD, OA, left eye blindness.    Patient Stated Goals The patient wants to find out why her symptoms are returning and why her right foot is tingling and numb.    Currently in Pain? Yes    Pain Location Back    Pain Orientation Lower  [facility] PT Assessment - 02/28/21 0001       Assessment   Medical Diagnosis Lumbar radiculopathy.    Referring Provider (PT) Fenton Malling NP      Precautions   Precaution Comments Lumbar fusion.      Restrictions   Weight Bearing Restrictions No                           [facility] Adult PT Treatment/Exercise - 02/28/21 0001       Modalities   Modalities Electrical Stimulation;Moist Heat;Ultrasound      Moist Heat Therapy   Number Minutes Moist Heat 15 Minutes    Moist Heat Location Lumbar Spine      Electrical Stimulation   Electrical Stimulation Location B lumbosacral    Electrical Stimulation Action IFC    Electrical Stimulation Parameters 80-150 hz x15 min    Electrical Stimulation  Goals Pain;Tone      Ultrasound   Ultrasound Location B lumbar paraspinals    Ultrasound Parameters Combo 1.5 w/cm2, 100%, 1 mhz x10 min    Ultrasound Goals Pain      Manual Therapy   Manual Therapy Soft tissue mobilization    Soft tissue mobilization STW to B lumbar paraspinals, QL to reduce pain and muscle tightness                          PT Long Term Goals - 02/21/21 1259       PT LONG TERM GOAL #1   Title Patient will be independent with HEP    Time 6    Period Weeks    Status New      PT LONG TERM GOAL #2   Title Perform ADL's with pain not > 3-4/10.    Time 6    Period Weeks    Status New      PT LONG TERM GOAL #3   Title Eliminate LE symptoms.    Time 6    Period Weeks    Status New                   Plan - 02/28/21 1203     Clinical Impression Statement Patient presented in with continued radicular symptoms to R foot into great toe and L thigh region. Patient does have spine stimulator but states that it never helped and is currently turned off. Patient presented with tenderness to R lumbar paraspinals and QL region. Normal modalities response noted following removal of the modalities.    Personal Factors and Comorbidities Other    Examination-Activity Limitations Other    Examination-Participation Restrictions Other    Stability/Clinical Decision Making Evolving/Moderate complexity    Rehab Potential Fair    PT Treatment/Interventions ADLs/Self Care Home Management;Cryotherapy;Electrical Stimulation;Ultrasound;Moist Heat;Functional mobility training;Therapeutic activities;Therapeutic exercise;Manual techniques;Passive range of motion    PT Next Visit Plan Combo e'stim/US and STW/M to patient's bilateral L-S region, core exercise progression.    Consulted and Agree with Plan of Care Patient             Patient will benefit from skilled therapeutic intervention in order to improve the following deficits and impairments:  Pain,  Decreased activity tolerance  Visit Diagnosis: Chronic bilateral low back pain, unspecified whether sciatica present     Problem List Patient Active Problem List   Diagnosis Date Noted   SIRS (systemic inflammatory response syndrome) ([place]) 06/03/2020   ARF (acute renal failure) ([place])  05/22/2020   Diarrhea 05/22/2020   Nausea 05/22/2020   Spondylolisthesis of lumbar region 05/04/2020   Chronic pain disorder 08/12/2019   COVID-19    Acute on chronic renal failure ([place]) 02/16/2019   Insomnia disorder 02/27/2018   Fatigue 02/27/2018   Fibromyalgia syndrome 04/30/2017   RLS (restless legs syndrome) 12/26/2016   B12 deficiency 12/26/2016   Vitamin D deficiency, unspecified 12/26/2016   GERD (gastroesophageal reflux disease) 12/26/2016   Anxiety disorder 12/26/2016   Hyponatremia 10/24/2016   AKI (acute kidney injury) ([place]) 10/23/2016   Hypomagnesemia 10/22/2016   Hypophosphatemia 10/22/2016   Hypokalemia 10/21/2016   Wound infection after surgery 10/21/2016   Sepsis ([place]) 10/21/2016   Myasthenia gravis ([place]) 10/21/2016   Melanoma ([place]) 10/21/2016   Crohn disease ([place]) 10/21/2016   Cellulitis 10/21/2016   Palpitations 08/25/2013   Leukocytosis 05/25/2011   Hyperlipidemia 04/20/2007   Essential hypertension 04/20/2007   Asthma 04/20/2007   CARPAL TUNNEL SYNDROME, HX OF 04/20/2007    Standley Brooking, PTA 02/28/2021, 12:10 PM  [facility] [address] [place], [name], [address] Phone: [phone]   Fax:  [phone]  Name: Teresa Lee MRN: [id number] Date of Birth: 1961-02-05

## 2021-03-01 ENCOUNTER — Ambulatory Visit: Payer: Medicare HMO | Admitting: Physical Therapy

## 2021-03-08 ENCOUNTER — Ambulatory Visit: Payer: Medicare HMO | Admitting: *Deleted

## 2021-03-08 ENCOUNTER — Other Ambulatory Visit: Payer: Self-pay

## 2021-03-08 DIAGNOSIS — M5416 Radiculopathy, lumbar region: Secondary | ICD-10-CM | POA: Diagnosis not present

## 2021-03-08 DIAGNOSIS — G8929 Other chronic pain: Secondary | ICD-10-CM

## 2021-03-08 NOTE — Therapy (Signed)
[facility] [place], [name], [address] Phone: [phone]   Fax:  [phone]  Physical Therapy Treatment  Patient Details  Name: Teresa Lee MRN: [id number] Date of Birth: May 14, 1960 Referring Provider (PT): Fenton Malling NP   Encounter Date: 03/08/2021   PT End of Session - 03/08/21 1029     Visit Number 3    Number of Visits 6    Date for PT Re-Evaluation 03/21/21    PT Start Time 1030    PT Stop Time 1120    PT Time Calculation (min) 50 min             Past Medical History:  Diagnosis Date   Arthritis    osteoarthritis   Asthma    Blind left eye    Cancer ([place])    skin   Crohn's disease ([place])    CTS (carpal tunnel syndrome)    DJD (degenerative joint disease)    Both cervical spine and LS spine   Fibromyalgia    GERD (gastroesophageal reflux disease)    Heart murmur    Herniated nucleus pulposus    Hyperlipidemia    Hypertension    IBS (irritable bowel syndrome)    Leukocytosis    Myasthenia gravis    Myasthenia gravis ([place])    Myasthenia gravis ([place])    Pneumonia    Spinal cord stimulator status     Past Surgical History:  Procedure Laterality Date   BACK SURGERY     BIOPSY  05/24/2020   Procedure: BIOPSY;  Surgeon: Ronald Lobo, MD;  Location: [place];  Service: Endoscopy;;   BREAST REDUCTION SURGERY     CARPAL TUNNEL RELEASE     bilateral   cervical disc inj     CHOLECYSTECTOMY     COLONOSCOPY WITH PROPOFOL N/A 05/24/2020   Procedure: COLONOSCOPY WITH PROPOFOL;  Surgeon: Ronald Lobo, MD;  Location: [place];  Service: Endoscopy;  Laterality: N/A;   ENDOMETRIAL ABLATION W/ NOVASURE     ESOPHAGOGASTRODUODENOSCOPY (EGD) WITH PROPOFOL N/A 05/24/2020   Procedure: ESOPHAGOGASTRODUODENOSCOPY (EGD) WITH PROPOFOL;  Surgeon: Ronald Lobo, MD;  Location: [place];  Service: Endoscopy;  Laterality: N/A;   LUMBAR LAMINECTOMY/DECOMPRESSION MICRODISCECTOMY Right  05/04/2020   Procedure: Right Lumbar Five Sacral One Microdiscectomy;  Surgeon: Erline Levine, MD;  Location: [place];  Service: Neurosurgery;  Laterality: Right;  posterior   MELANOMA EXCISION     NASAL SINUS SURGERY     SPINAL CORD STIMULATOR INSERTION  2019   THYMECTOMY     due to myastenia gravis   THYMECTOMY     TRANSFORAMINAL LUMBAR INTERBODY FUSION (TLIF) WITH PEDICLE SCREW FIXATION 1 LEVEL Right 05/04/2020   Procedure: Right Lumbar Four-Five Transforaminal lumbar interbody fusion;  Surgeon: Erline Levine, MD;  Location: [place];  Service: Neurosurgery;  Laterality: Right;  posterior    There were no vitals filed for this visit.   Subjective Assessment - 03/08/21 1029     Subjective COVID-19 screen performed prior to patient entering clinic. Reports same symptoms today    Pertinent History Fibromyalgia, MG, HTN, CTS, DJD, OA, left eye blindness.    Patient Stated Goals The patient wants to find out why her symptoms are returning and why her right foot is tingling and numb.    Currently in Pain? Yes    Pain Score 6     Pain Location Back    Pain Orientation Lower    Pain Descriptors / Indicators Aching    Pain  Type Chronic pain    Pain Onset More than a month ago                               [facility] Adult PT Treatment/Exercise - 03/08/21 0001       Modalities   Modalities Electrical Stimulation;Moist Heat;Ultrasound      Moist Heat Therapy   Number Minutes Moist Heat 15 Minutes    Moist Heat Location Lumbar Spine      Electrical Stimulation   Electrical Stimulation Location L4-S!    Psychologist, forensic Parameters x15 mins 40/710 100 microcurrent    Electrical Stimulation Goals Pain;Tone      Ultrasound   Ultrasound Location Bil LB paras    Ultrasound Parameters Combo 1.5 w/cm2 x 10 mins    Ultrasound Goals Pain      Manual Therapy   Manual Therapy Soft tissue mobilization    Manual therapy comments  STW/M x 13 minutes to patient's bilateral lumbar musculature.                          PT Long Term Goals - 02/21/21 1259       PT LONG TERM GOAL #1   Title Patient will be independent with HEP    Time 6    Period Weeks    Status New      PT LONG TERM GOAL #2   Title Perform ADL's with pain not > 3-4/10.    Time 6    Period Weeks    Status New      PT LONG TERM GOAL #3   Title Eliminate LE symptoms.    Time 6    Period Weeks    Status New                   Plan - 03/08/21 1030     Clinical Impression Statement Pt arrived today doing fair , but still with same symptoms RT foot numbness and LT quad tingling. Rx focused on LB with [name] combo, STW to paras f/b microcurrent. All tolerated well.    Personal Factors and Comorbidities Other    Examination-Activity Limitations Other    Examination-Participation Restrictions Other    Stability/Clinical Decision Making Evolving/Moderate complexity    Rehab Potential Fair    PT Treatment/Interventions ADLs/Self Care Home Management;Cryotherapy;Electrical Stimulation;Ultrasound;Moist Heat;Functional mobility training;Therapeutic activities;Therapeutic exercise;Manual techniques;Passive range of motion    PT Next Visit Plan Combo e'stim/US and STW/M to patient's bilateral L-S region, core exercise progression.    Consulted and Agree with Plan of Care Patient             Patient will benefit from skilled therapeutic intervention in order to improve the following deficits and impairments:  Pain, Decreased activity tolerance  Visit Diagnosis: Chronic bilateral low back pain, unspecified whether sciatica present     Problem List Patient Active Problem List   Diagnosis Date Noted   SIRS (systemic inflammatory response syndrome) ([place]) 06/03/2020   ARF (acute renal failure) ([place]) 05/22/2020   Diarrhea 05/22/2020   Nausea 05/22/2020   Spondylolisthesis of lumbar region 05/04/2020   Chronic pain disorder  08/12/2019   COVID-19    Acute on chronic renal failure ([place]) 02/16/2019   Insomnia disorder 02/27/2018   Fatigue 02/27/2018   Fibromyalgia syndrome 04/30/2017   RLS (restless legs syndrome) 12/26/2016   B12 deficiency  12/26/2016   Vitamin D deficiency, unspecified 12/26/2016   GERD (gastroesophageal reflux disease) 12/26/2016   Anxiety disorder 12/26/2016   Hyponatremia 10/24/2016   AKI (acute kidney injury) ([place]) 10/23/2016   Hypomagnesemia 10/22/2016   Hypophosphatemia 10/22/2016   Hypokalemia 10/21/2016   Wound infection after surgery 10/21/2016   Sepsis ([place]) 10/21/2016   Myasthenia gravis ([place]) 10/21/2016   Melanoma ([place]) 10/21/2016   Crohn disease ([place]) 10/21/2016   Cellulitis 10/21/2016   Palpitations 08/25/2013   Leukocytosis 05/25/2011   Hyperlipidemia 04/20/2007   Essential hypertension 04/20/2007   Asthma 04/20/2007   CARPAL TUNNEL SYNDROME, HX OF 04/20/2007    Lenardo Westwood,CHRIS, PTA 03/08/2021, [facility] [address] [place], [name], [address] Phone: [phone]   Fax:  [phone]  Name: Teresa Lee MRN: [id number] Date of Birth: 1961-02-18

## 2021-03-15 ENCOUNTER — Ambulatory Visit: Payer: Medicare HMO | Admitting: *Deleted

## 2021-03-15 ENCOUNTER — Other Ambulatory Visit: Payer: Self-pay

## 2021-03-15 DIAGNOSIS — G8929 Other chronic pain: Secondary | ICD-10-CM

## 2021-03-15 DIAGNOSIS — M5416 Radiculopathy, lumbar region: Secondary | ICD-10-CM | POA: Diagnosis not present

## 2021-03-15 NOTE — Therapy (Signed)
[facility] [place], [name], [address] Phone: [phone]   Fax:  [phone]  Physical Therapy Treatment  Patient Details  Name: Teresa Lee MRN: [id number] Date of Birth: 11-Oct-1960 Referring Provider (PT): Fenton Malling NP   Encounter Date: 03/15/2021   PT End of Session - 03/15/21 1306     Visit Number 4    Number of Visits 6    Date for PT Re-Evaluation 03/21/21    PT Start Time 1300    PT Stop Time 1350    PT Time Calculation (min) 50 min             Past Medical History:  Diagnosis Date   Arthritis    osteoarthritis   Asthma    Blind left eye    Cancer ([place])    skin   Crohn's disease ([place])    CTS (carpal tunnel syndrome)    DJD (degenerative joint disease)    Both cervical spine and LS spine   Fibromyalgia    GERD (gastroesophageal reflux disease)    Heart murmur    Herniated nucleus pulposus    Hyperlipidemia    Hypertension    IBS (irritable bowel syndrome)    Leukocytosis    Myasthenia gravis    Myasthenia gravis ([place])    Myasthenia gravis ([place])    Pneumonia    Spinal cord stimulator status     Past Surgical History:  Procedure Laterality Date   BACK SURGERY     BIOPSY  05/24/2020   Procedure: BIOPSY;  Surgeon: Ronald Lobo, MD;  Location: [place];  Service: Endoscopy;;   BREAST REDUCTION SURGERY     CARPAL TUNNEL RELEASE     bilateral   cervical disc inj     CHOLECYSTECTOMY     COLONOSCOPY WITH PROPOFOL N/A 05/24/2020   Procedure: COLONOSCOPY WITH PROPOFOL;  Surgeon: Ronald Lobo, MD;  Location: [place];  Service: Endoscopy;  Laterality: N/A;   ENDOMETRIAL ABLATION W/ NOVASURE     ESOPHAGOGASTRODUODENOSCOPY (EGD) WITH PROPOFOL N/A 05/24/2020   Procedure: ESOPHAGOGASTRODUODENOSCOPY (EGD) WITH PROPOFOL;  Surgeon: Ronald Lobo, MD;  Location: [place];  Service: Endoscopy;  Laterality: N/A;   LUMBAR LAMINECTOMY/DECOMPRESSION MICRODISCECTOMY Right  05/04/2020   Procedure: Right Lumbar Five Sacral One Microdiscectomy;  Surgeon: Erline Levine, MD;  Location: [place];  Service: Neurosurgery;  Laterality: Right;  posterior   MELANOMA EXCISION     NASAL SINUS SURGERY     SPINAL CORD STIMULATOR INSERTION  2019   THYMECTOMY     due to myastenia gravis   THYMECTOMY     TRANSFORAMINAL LUMBAR INTERBODY FUSION (TLIF) WITH PEDICLE SCREW FIXATION 1 LEVEL Right 05/04/2020   Procedure: Right Lumbar Four-Five Transforaminal lumbar interbody fusion;  Surgeon: Erline Levine, MD;  Location: [place];  Service: Neurosurgery;  Laterality: Right;  posterior    There were no vitals filed for this visit.   Subjective Assessment - 03/15/21 1304     Subjective COVID-19 screen performed prior to patient entering clinic. 7-8/10  this AM, but doing better after moving around    Pertinent History Fibromyalgia, MG, HTN, CTS, DJD, OA, left eye blindness.    Patient Stated Goals The patient wants to find out why her symptoms are returning and why her right foot is tingling and numb.    Currently in Pain? Yes    Pain Score 6     Pain Location Back    Pain Orientation Lower    Pain Descriptors /  Indicators Aching    Pain Type Chronic pain    Pain Onset More than a month ago                               [facility] Adult PT Treatment/Exercise - 03/15/21 0001       Exercises   Exercises Lumbar      Lumbar Exercises: Aerobic   Nustep x9 mins L4      Modalities   Modalities Electrical Stimulation;Moist Heat;Ultrasound      Moist Heat Therapy   Moist Heat Location Lumbar Spine      Electrical Stimulation   Electrical Stimulation Location L5-S1    Electrical Stimulation Action microcurrent    Electrical Stimulation Parameters x15 mins 40/710    Electrical Stimulation Goals Pain;Tone      Manual Therapy   Manual Therapy Soft tissue mobilization    Manual therapy comments STW/M x 15 minutes to patient's bilateral lumbar musculature and RT SIJ                           PT Long Term Goals - 02/21/21 1259       PT LONG TERM GOAL #1   Title Patient will be independent with HEP    Time 6    Period Weeks    Status New      PT LONG TERM GOAL #2   Title Perform ADL's with pain not > 3-4/10.    Time 6    Period Weeks    Status New      PT LONG TERM GOAL #3   Title Eliminate LE symptoms.    Time 6    Period Weeks    Status New                   Plan - 03/15/21 1306     Clinical Impression Statement Pt arrived today doing fairly well , but still with pain 7-8/10 at times RT side LB and symptoms into RT foot. Rx consisted of therx, STW, and microcurrent to LB. Pt did well with Rx and reports decreasedpain and symptoms.    Personal Factors and Comorbidities Other    Examination-Activity Limitations Other    Examination-Participation Restrictions Other    Stability/Clinical Decision Making Evolving/Moderate complexity    Rehab Potential Fair    PT Treatment/Interventions ADLs/Self Care Home Management;Cryotherapy;Electrical Stimulation;Ultrasound;Moist Heat;Functional mobility training;Therapeutic activities;Therapeutic exercise;Manual techniques;Passive range of motion    PT Next Visit Plan Combo e'stim/US and STW/M to patient's bilateral L-S region, core exercise progression.    Consulted and Agree with Plan of Care Patient             Patient will benefit from skilled therapeutic intervention in order to improve the following deficits and impairments:  Pain, Decreased activity tolerance  Visit Diagnosis: Chronic bilateral low back pain, unspecified whether sciatica present     Problem List Patient Active Problem List   Diagnosis Date Noted   SIRS (systemic inflammatory response syndrome) ([place]) 06/03/2020   ARF (acute renal failure) ([place]) 05/22/2020   Diarrhea 05/22/2020   Nausea 05/22/2020   Spondylolisthesis of lumbar region 05/04/2020   Chronic pain disorder 08/12/2019   COVID-19     Acute on chronic renal failure ([place]) 02/16/2019   Insomnia disorder 02/27/2018   Fatigue 02/27/2018   Fibromyalgia syndrome 04/30/2017   RLS (restless legs syndrome) 12/26/2016   B12 deficiency  12/26/2016   Vitamin D deficiency, unspecified 12/26/2016   GERD (gastroesophageal reflux disease) 12/26/2016   Anxiety disorder 12/26/2016   Hyponatremia 10/24/2016   AKI (acute kidney injury) ([place]) 10/23/2016   Hypomagnesemia 10/22/2016   Hypophosphatemia 10/22/2016   Hypokalemia 10/21/2016   Wound infection after surgery 10/21/2016   Sepsis ([place]) 10/21/2016   Myasthenia gravis ([place]) 10/21/2016   Melanoma ([place]) 10/21/2016   Crohn disease ([place]) 10/21/2016   Cellulitis 10/21/2016   Palpitations 08/25/2013   Leukocytosis 05/25/2011   Hyperlipidemia 04/20/2007   Essential hypertension 04/20/2007   Asthma 04/20/2007   CARPAL TUNNEL SYNDROME, HX OF 04/20/2007    Keirstyn Aydt,CHRIS, PTA 03/15/2021, 5:13 PM  [facility] [address] [place], [name], [address] Phone: [phone]   Fax:  [phone]  Name: Teresa Lee MRN: [id number] Date of Birth: 06-10-1960

## 2021-03-22 ENCOUNTER — Ambulatory Visit: Payer: Medicare HMO | Attending: Neurosurgery | Admitting: *Deleted

## 2021-03-22 ENCOUNTER — Other Ambulatory Visit: Payer: Self-pay

## 2021-03-22 DIAGNOSIS — G8929 Other chronic pain: Secondary | ICD-10-CM | POA: Insufficient documentation

## 2021-03-22 DIAGNOSIS — M545 Low back pain, unspecified: Secondary | ICD-10-CM | POA: Insufficient documentation

## 2021-03-22 DIAGNOSIS — M5416 Radiculopathy, lumbar region: Secondary | ICD-10-CM | POA: Insufficient documentation

## 2021-03-22 NOTE — Therapy (Signed)
[facility] [place], [name], [address] Phone: [phone]   Fax:  [phone]  Physical Therapy Treatment  Patient Details  Name: Teresa Lee MRN: [id number] Date of Birth: 11-10-1960 Referring Provider (PT): Fenton Malling NP   Encounter Date: 03/22/2021   PT End of Session - 03/22/21 1127     Visit Number 5    Number of Visits 6    Date for PT Re-Evaluation 03/21/21    PT Start Time 34    PT Stop Time 1205    PT Time Calculation (min) 50 min             Past Medical History:  Diagnosis Date   Arthritis    osteoarthritis   Asthma    Blind left eye    Cancer ([place])    skin   Crohn's disease ([place])    CTS (carpal tunnel syndrome)    DJD (degenerative joint disease)    Both cervical spine and LS spine   Fibromyalgia    GERD (gastroesophageal reflux disease)    Heart murmur    Herniated nucleus pulposus    Hyperlipidemia    Hypertension    IBS (irritable bowel syndrome)    Leukocytosis    Myasthenia gravis    Myasthenia gravis ([place])    Myasthenia gravis ([place])    Pneumonia    Spinal cord stimulator status     Past Surgical History:  Procedure Laterality Date   BACK SURGERY     BIOPSY  05/24/2020   Procedure: BIOPSY;  Surgeon: Ronald Lobo, MD;  Location: [place];  Service: Endoscopy;;   BREAST REDUCTION SURGERY     CARPAL TUNNEL RELEASE     bilateral   cervical disc inj     CHOLECYSTECTOMY     COLONOSCOPY WITH PROPOFOL N/A 05/24/2020   Procedure: COLONOSCOPY WITH PROPOFOL;  Surgeon: Ronald Lobo, MD;  Location: [place];  Service: Endoscopy;  Laterality: N/A;   ENDOMETRIAL ABLATION W/ NOVASURE     ESOPHAGOGASTRODUODENOSCOPY (EGD) WITH PROPOFOL N/A 05/24/2020   Procedure: ESOPHAGOGASTRODUODENOSCOPY (EGD) WITH PROPOFOL;  Surgeon: Ronald Lobo, MD;  Location: [place];  Service: Endoscopy;  Laterality: N/A;   LUMBAR LAMINECTOMY/DECOMPRESSION MICRODISCECTOMY Right 05/04/2020    Procedure: Right Lumbar Five Sacral One Microdiscectomy;  Surgeon: Erline Levine, MD;  Location: [place];  Service: Neurosurgery;  Laterality: Right;  posterior   MELANOMA EXCISION     NASAL SINUS SURGERY     SPINAL CORD STIMULATOR INSERTION  2019   THYMECTOMY     due to myastenia gravis   THYMECTOMY     TRANSFORAMINAL LUMBAR INTERBODY FUSION (TLIF) WITH PEDICLE SCREW FIXATION 1 LEVEL Right 05/04/2020   Procedure: Right Lumbar Four-Five Transforaminal lumbar interbody fusion;  Surgeon: Erline Levine, MD;  Location: [place];  Service: Neurosurgery;  Laterality: Right;  posterior    There were no vitals filed for this visit.   Subjective Assessment - 03/22/21 1143     Subjective COVID-19 screen performed prior to patient entering clinic. Very sore RT side today. Pain still into HS and foot at times    Pertinent History Fibromyalgia, MG, HTN, CTS, DJD, OA, left eye blindness.    Patient Stated Goals The patient wants to find out why her symptoms are returning and why her right foot is tingling and numb.    Currently in Pain? Yes    Pain Score 6     Pain Location Back    Pain Orientation Lower  Pain Descriptors / Indicators Aching    Pain Type Chronic pain    Pain Onset More than a month ago                               [facility] Adult PT Treatment/Exercise - 03/22/21 0001       Exercises   Exercises Lumbar      Modalities   Modalities Electrical Stimulation;Moist Heat;Ultrasound      Moist Heat Therapy   Number Minutes Moist Heat 20 Minutes    Moist Heat Location Lumbar Spine      Electrical Stimulation   Electrical Stimulation Location L5-SIJ    Electrical Stimulation Action microcurrent    Electrical Stimulation Parameters x 20 mins 77/124    Electrical Stimulation Goals Pain;Tone      Manual Therapy   Manual Therapy Soft tissue mobilization    Manual therapy comments STW/M x 23 minutes to patient's bilateral lumbar musculature and RT SIJ                           PT Long Term Goals - 02/21/21 1259       PT LONG TERM GOAL #1   Title Patient will be independent with HEP    Time 6    Period Weeks    Status New      PT LONG TERM GOAL #2   Title Perform ADL's with pain not > 3-4/10.    Time 6    Period Weeks    Status New      PT LONG TERM GOAL #3   Title Eliminate LE symptoms.    Time 6    Period Weeks    Status New                   Plan - 03/22/21 1158     Clinical Impression Statement Pt arrived today doing about the same with pain and soreness in RT side LB and SIJ. We reviewed pain triggers and on how to avoid them. Pt reports still doing core exs at home and trying to avoid bending. Notable tightness on RT side lower RT sided LB paras. 1 visit left    Personal Factors and Comorbidities Other    Examination-Activity Limitations Other    Examination-Participation Restrictions Other    Stability/Clinical Decision Making Evolving/Moderate complexity    Rehab Potential Fair    PT Treatment/Interventions ADLs/Self Care Home Management;Cryotherapy;Electrical Stimulation;Ultrasound;Moist Heat;Functional mobility training;Therapeutic activities;Therapeutic exercise;Manual techniques;Passive range of motion    PT Next Visit Plan 1 visit left    Consulted and Agree with Plan of Care Patient             Patient will benefit from skilled therapeutic intervention in order to improve the following deficits and impairments:  Pain, Decreased activity tolerance  Visit Diagnosis: Chronic bilateral low back pain, unspecified whether sciatica present     Problem List Patient Active Problem List   Diagnosis Date Noted   SIRS (systemic inflammatory response syndrome) ([place]) 06/03/2020   ARF (acute renal failure) ([place]) 05/22/2020   Diarrhea 05/22/2020   Nausea 05/22/2020   Spondylolisthesis of lumbar region 05/04/2020   Chronic pain disorder 08/12/2019   COVID-19    Acute on chronic renal failure  ([place]) 02/16/2019   Insomnia disorder 02/27/2018   Fatigue 02/27/2018   Fibromyalgia syndrome 04/30/2017   RLS (restless legs syndrome) 12/26/2016  B12 deficiency 12/26/2016   Vitamin D deficiency, unspecified 12/26/2016   GERD (gastroesophageal reflux disease) 12/26/2016   Anxiety disorder 12/26/2016   Hyponatremia 10/24/2016   AKI (acute kidney injury) ([place]) 10/23/2016   Hypomagnesemia 10/22/2016   Hypophosphatemia 10/22/2016   Hypokalemia 10/21/2016   Wound infection after surgery 10/21/2016   Sepsis ([place]) 10/21/2016   Myasthenia gravis ([place]) 10/21/2016   Melanoma ([place]) 10/21/2016   Crohn disease ([place]) 10/21/2016   Cellulitis 10/21/2016   Palpitations 08/25/2013   Leukocytosis 05/25/2011   Hyperlipidemia 04/20/2007   Essential hypertension 04/20/2007   Asthma 04/20/2007   CARPAL TUNNEL SYNDROME, HX OF 04/20/2007    Amisadai Woodford,CHRIS, PTA 03/22/2021, 1:38 PM  [facility] [address] [place], [name], [address] Phone: [phone]   Fax:  [phone]  Name: Swara Donze MRN: [id number] Date of Birth: 07/18/1960

## 2021-03-29 ENCOUNTER — Other Ambulatory Visit: Payer: Self-pay

## 2021-03-29 ENCOUNTER — Ambulatory Visit: Payer: Medicare HMO | Admitting: *Deleted

## 2021-03-29 DIAGNOSIS — M545 Low back pain, unspecified: Secondary | ICD-10-CM

## 2021-03-29 DIAGNOSIS — G8929 Other chronic pain: Secondary | ICD-10-CM

## 2021-03-29 DIAGNOSIS — M5416 Radiculopathy, lumbar region: Secondary | ICD-10-CM | POA: Diagnosis not present

## 2021-03-29 NOTE — Therapy (Signed)
[facility] [place], [name], [address] Phone: [phone]   Fax:  [phone]  Physical Therapy Treatment  Patient Details  Name: Teresa Lee MRN: [id number] Date of Birth: [birth year] Referring Provider (PT): Fenton Malling NP   Encounter Date: 03/29/2021   PT End of Session - 03/29/21 0904     Visit Number 6    Number of Visits 6    Date for PT Re-Evaluation 03/21/21    PT Start Time 0900    PT Stop Time 0950    PT Time Calculation (min) 50 min             Past Medical History:  Diagnosis Date   Arthritis    osteoarthritis   Asthma    Blind left eye    Cancer ([place])    skin   Crohn's disease ([place])    CTS (carpal tunnel syndrome)    DJD (degenerative joint disease)    Both cervical spine and LS spine   Fibromyalgia    GERD (gastroesophageal reflux disease)    Heart murmur    Herniated nucleus pulposus    Hyperlipidemia    Hypertension    IBS (irritable bowel syndrome)    Leukocytosis    Myasthenia gravis    Myasthenia gravis ([place])    Myasthenia gravis ([place])    Pneumonia    Spinal cord stimulator status     Past Surgical History:  Procedure Laterality Date   BACK SURGERY     BIOPSY  05/24/2020   Procedure: BIOPSY;  Surgeon: Ronald Lobo, MD;  Location: [place];  Service: Endoscopy;;   BREAST REDUCTION SURGERY     CARPAL TUNNEL RELEASE     bilateral   cervical disc inj     CHOLECYSTECTOMY     COLONOSCOPY WITH PROPOFOL N/A 05/24/2020   Procedure: COLONOSCOPY WITH PROPOFOL;  Surgeon: Ronald Lobo, MD;  Location: [place];  Service: Endoscopy;  Laterality: N/A;   ENDOMETRIAL ABLATION W/ NOVASURE     ESOPHAGOGASTRODUODENOSCOPY (EGD) WITH PROPOFOL N/A 05/24/2020   Procedure: ESOPHAGOGASTRODUODENOSCOPY (EGD) WITH PROPOFOL;  Surgeon: Ronald Lobo, MD;  Location: [place];  Service: Endoscopy;  Laterality: N/A;   LUMBAR LAMINECTOMY/DECOMPRESSION MICRODISCECTOMY Right  05/04/2020   Procedure: Right Lumbar Five Sacral One Microdiscectomy;  Surgeon: Erline Levine, MD;  Location: [place];  Service: Neurosurgery;  Laterality: Right;  posterior   MELANOMA EXCISION     NASAL SINUS SURGERY     SPINAL CORD STIMULATOR INSERTION  2019   THYMECTOMY     due to myastenia gravis   THYMECTOMY     TRANSFORAMINAL LUMBAR INTERBODY FUSION (TLIF) WITH PEDICLE SCREW FIXATION 1 LEVEL Right 05/04/2020   Procedure: Right Lumbar Four-Five Transforaminal lumbar interbody fusion;  Surgeon: Erline Levine, MD;  Location: [place];  Service: Neurosurgery;  Laterality: Right;  posterior    There were no vitals filed for this visit.   Subjective Assessment - 03/29/21 0902     Subjective COVID-19 screen performed prior to patient entering clinic. Very sore RT side today, but less today    Pertinent History Fibromyalgia, MG, HTN, CTS, DJD, OA, left eye blindness.    Patient Stated Goals The patient wants to find out why her symptoms are returning and why her right foot is tingling and numb.    Currently in Pain? Yes    Pain Score 5     Pain Location Back    Pain Orientation Lower;Right    Pain Descriptors / Indicators Aching  Pain Type Chronic pain                               [facility] Adult PT Treatment/Exercise - 03/29/21 0001       Exercises   Exercises Lumbar      Modalities   Modalities Electrical Stimulation;Moist Heat;Ultrasound      Moist Heat Therapy   Number Minutes Moist Heat 20 Minutes    Moist Heat Location Lumbar Spine      Electrical Stimulation   Electrical Stimulation Location L5-SIJ    Electrical Stimulation Action Microcurrent    Electrical Stimulation Parameters x 20 mins 100/124    Electrical Stimulation Goals Pain;Tone      Manual Therapy   Manual Therapy Soft tissue mobilization    Manual therapy comments STW/M x 23 minutes to patient's bilateral lumbar musculature and RT SIJ with Pt LT sidelying                           PT Long Term Goals - 03/29/21 0940       PT LONG TERM GOAL #1   Title Patient will be independent with HEP    Baseline issued 10/21/20    Time 6    Period Weeks    Status Achieved    Target Date 02/21/17      PT LONG TERM GOAL #2   Title Perform ADL's with pain not > 3-4/10.    Baseline met 1-2/10 reported 10/21/20    Time 6    Period Weeks    Status On-going    Target Date 02/21/17      PT LONG TERM GOAL #3   Title Eliminate LE symptoms.    Baseline 1-2/10 pain 10/21/20    Status On-going                   Plan - 03/29/21 0945     Clinical Impression Statement Pt arrived today doing about the same with pain in LB. She  has done well with PT,but only short term relief. She is independent in HEP and was able to meet LTG #1, but not others due to ongoing pain.    Personal Factors and Comorbidities Other    Examination-Activity Limitations Other    Examination-Participation Restrictions Other    Rehab Potential Fair    PT Treatment/Interventions ADLs/Self Care Home Management;Cryotherapy;Electrical Stimulation;Ultrasound;Moist Heat;Functional mobility training;Therapeutic activities;Therapeutic exercise;Manual techniques;Passive range of motion    PT Next Visit Plan On hold. MD note    Consulted and Agree with Plan of Care Patient             Patient will benefit from skilled therapeutic intervention in order to improve the following deficits and impairments:  Pain, Decreased activity tolerance  Visit Diagnosis: Chronic bilateral low back pain, unspecified whether sciatica present     Problem List Patient Active Problem List   Diagnosis Date Noted   SIRS (systemic inflammatory response syndrome) ([place]) 06/03/2020   ARF (acute renal failure) ([place]) 05/22/2020   Diarrhea 05/22/2020   Nausea 05/22/2020   Spondylolisthesis of lumbar region 05/04/2020   Chronic pain disorder 08/12/2019   COVID-19    Acute on chronic renal failure  ([place]) 02/16/2019   Insomnia disorder 02/27/2018   Fatigue 02/27/2018   Fibromyalgia syndrome 04/30/2017   RLS (restless legs syndrome) 12/26/2016   B12 deficiency 12/26/2016   Vitamin D deficiency, unspecified 12/26/2016  GERD (gastroesophageal reflux disease) 12/26/2016   Anxiety disorder 12/26/2016   Hyponatremia 10/24/2016   AKI (acute kidney injury) ([place]) 10/23/2016   Hypomagnesemia 10/22/2016   Hypophosphatemia 10/22/2016   Hypokalemia 10/21/2016   Wound infection after surgery 10/21/2016   Sepsis ([place]) 10/21/2016   Myasthenia gravis ([place]) 10/21/2016   Melanoma ([place]) 10/21/2016   Crohn disease ([place]) 10/21/2016   Cellulitis 10/21/2016   Palpitations 08/25/2013   Leukocytosis 05/25/2011   Hyperlipidemia 04/20/2007   Essential hypertension 04/20/2007   Asthma 04/20/2007   CARPAL TUNNEL SYNDROME, HX OF 04/20/2007    Alisse Tuite,CHRIS, PTA 03/29/2021, 12:57 PM  [facility] [address] [place], [name], [address] Phone: [phone]   Fax:  [phone]  Name: Sadiya Durand MRN: [id number] Date of Birth: [birth year]

## 2021-05-11 ENCOUNTER — Other Ambulatory Visit (HOSPITAL_COMMUNITY): Payer: Self-pay | Admitting: Neurosurgery

## 2021-05-11 DIAGNOSIS — M5416 Radiculopathy, lumbar region: Secondary | ICD-10-CM

## 2021-05-23 ENCOUNTER — Other Ambulatory Visit (HOSPITAL_COMMUNITY): Payer: Self-pay | Admitting: Orthopaedic Surgery

## 2021-05-23 ENCOUNTER — Other Ambulatory Visit: Payer: Self-pay | Admitting: Orthopaedic Surgery

## 2021-05-23 DIAGNOSIS — M79672 Pain in left foot: Secondary | ICD-10-CM

## 2021-05-25 ENCOUNTER — Other Ambulatory Visit: Payer: Self-pay

## 2021-05-25 ENCOUNTER — Ambulatory Visit (HOSPITAL_COMMUNITY)
Admission: RE | Admit: 2021-05-25 | Discharge: 2021-05-25 | Disposition: A | Discharge status: Home / Self Care | Payer: Medicare HMO | Source: Ambulatory Visit | Attending: Neurosurgery | Admitting: Neurosurgery

## 2021-05-25 ENCOUNTER — Ambulatory Visit (HOSPITAL_COMMUNITY)
Admission: RE | Admit: 2021-05-25 | Discharge: 2021-05-25 | Disposition: A | Discharge status: Home / Self Care | Payer: Medicare HMO | Source: Ambulatory Visit | Attending: Orthopaedic Surgery | Admitting: Orthopaedic Surgery

## 2021-05-25 DIAGNOSIS — M79672 Pain in left foot: Secondary | ICD-10-CM | POA: Insufficient documentation

## 2021-05-25 DIAGNOSIS — M5416 Radiculopathy, lumbar region: Secondary | ICD-10-CM | POA: Insufficient documentation

## 2021-05-25 MED ORDER — GADOBUTROL 1 MMOL/ML IV SOLN
7.0000 mL | Freq: Once | INTRAVENOUS | Status: AC | PRN
  Administered 2021-05-25: 7 mL via INTRAVENOUS

## 2021-07-19 ENCOUNTER — Encounter (HOSPITAL_BASED_OUTPATIENT_CLINIC_OR_DEPARTMENT_OTHER): Payer: Self-pay | Admitting: Physical Therapy

## 2021-07-19 ENCOUNTER — Ambulatory Visit (HOSPITAL_BASED_OUTPATIENT_CLINIC_OR_DEPARTMENT_OTHER): Payer: Medicare HMO | Attending: Neurosurgery | Admitting: Physical Therapy

## 2021-07-19 DIAGNOSIS — M5459 Other low back pain: Secondary | ICD-10-CM | POA: Diagnosis not present

## 2021-07-19 DIAGNOSIS — M5416 Radiculopathy, lumbar region: Secondary | ICD-10-CM | POA: Diagnosis present

## 2021-07-19 DIAGNOSIS — R262 Difficulty in walking, not elsewhere classified: Secondary | ICD-10-CM | POA: Diagnosis not present

## 2021-07-19 DIAGNOSIS — M6281 Muscle weakness (generalized): Secondary | ICD-10-CM | POA: Diagnosis not present

## 2021-07-19 NOTE — Therapy (Signed)
?OUTPATIENT PHYSICAL THERAPY THORACOLUMBAR EVALUATION ? ? ?Patient Name: Teresa Lee ?MRN: [id number] ?DOB:24-Apr-1960, 61 y.o., female ?Today's Date: 07/19/2021 ? ? PT End of Session - 07/19/21 1105   ? ? Visit Number 1   ? Number of Visits 16   ? Date for PT Re-Evaluation 10/17/21   ? Authorization Type Aetna MCR   ? PT Start Time 1103   ? PT Stop Time 1145   ? PT Time Calculation (min) 42 min   ? Activity Tolerance Patient tolerated treatment well;Patient limited by pain   ? Behavior During Therapy [facility] for tasks assessed/performed   ? ?  ?  ? ?  ? ? ?Past Medical History:  ?Diagnosis Date  ? Arthritis   ? osteoarthritis  ? Asthma   ? Blind left eye   ? Cancer [facility])   ? skin  ? Crohn's disease ([place])   ? CTS (carpal tunnel syndrome)   ? DJD (degenerative joint disease)   ? Both cervical spine and LS spine  ? Fibromyalgia   ? GERD (gastroesophageal reflux disease)   ? Heart murmur   ? Herniated nucleus pulposus   ? Hyperlipidemia   ? Hypertension   ? IBS (irritable bowel syndrome)   ? Leukocytosis   ? Myasthenia gravis   ? Myasthenia gravis ([place])   ? Myasthenia gravis ([place])   ? Pneumonia   ? Spinal cord stimulator status   ? ?Past Surgical History:  ?Procedure Laterality Date  ? BACK SURGERY    ? BIOPSY  05/24/2020  ? Procedure: BIOPSY;  Surgeon: Ronald Lobo, MD;  Location: [place];  Service: Endoscopy;;  ? BREAST REDUCTION SURGERY    ? CARPAL TUNNEL RELEASE    ? bilateral  ? cervical disc inj    ? CHOLECYSTECTOMY    ? COLONOSCOPY WITH PROPOFOL N/A 05/24/2020  ? Procedure: COLONOSCOPY WITH PROPOFOL;  Surgeon: Ronald Lobo, MD;  Location: [place];  Service: Endoscopy;  Laterality: N/A;  ? ENDOMETRIAL ABLATION W/ NOVASURE    ? ESOPHAGOGASTRODUODENOSCOPY (EGD) WITH PROPOFOL N/A 05/24/2020  ? Procedure: ESOPHAGOGASTRODUODENOSCOPY (EGD) WITH PROPOFOL;  Surgeon: Ronald Lobo, MD;  Location: [place];  Service: Endoscopy;  Laterality: N/A;  ? LUMBAR LAMINECTOMY/DECOMPRESSION MICRODISCECTOMY Right  05/04/2020  ? Procedure: Right Lumbar Five Sacral One Microdiscectomy;  Surgeon: Erline Levine, MD;  Location: [place];  Service: Neurosurgery;  Laterality: Right;  posterior  ? MELANOMA EXCISION    ? NASAL SINUS SURGERY    ? SPINAL CORD STIMULATOR INSERTION  2019  ? THYMECTOMY    ? due to myastenia gravis  ? THYMECTOMY    ? TRANSFORAMINAL LUMBAR INTERBODY FUSION (TLIF) WITH PEDICLE SCREW FIXATION 1 LEVEL Right 05/04/2020  ? Procedure: Right Lumbar Four-Five Transforaminal lumbar interbody fusion;  Surgeon: Erline Levine, MD;  Location: [place];  Service: Neurosurgery;  Laterality: Right;  posterior  ? ?Patient Active Problem List  ? Diagnosis Date Noted  ? SIRS (systemic inflammatory response syndrome) ([place]) 06/03/2020  ? ARF (acute renal failure) ([place]) 05/22/2020  ? Diarrhea 05/22/2020  ? Nausea 05/22/2020  ? Spondylolisthesis of lumbar region 05/04/2020  ? Chronic pain disorder 08/12/2019  ? COVID-19   ? Acute on chronic renal failure ([place]) 02/16/2019  ? Insomnia disorder 02/27/2018  ? Fatigue 02/27/2018  ? Fibromyalgia syndrome 04/30/2017  ? RLS (restless legs syndrome) 12/26/2016  ? B12 deficiency 12/26/2016  ? Vitamin D deficiency, unspecified 12/26/2016  ? GERD (gastroesophageal reflux disease) 12/26/2016  ? Anxiety disorder 12/26/2016  ? Hyponatremia 10/24/2016  ?  AKI (acute kidney injury) ([place]) 10/23/2016  ? Hypomagnesemia 10/22/2016  ? Hypophosphatemia 10/22/2016  ? Hypokalemia 10/21/2016  ? Wound infection after surgery 10/21/2016  ? Sepsis ([place]) 10/21/2016  ? Myasthenia gravis ([place]) 10/21/2016  ? Melanoma ([place]) 10/21/2016  ? Crohn disease ([place]) 10/21/2016  ? Cellulitis 10/21/2016  ? Palpitations 08/25/2013  ? Leukocytosis 05/25/2011  ? Hyperlipidemia 04/20/2007  ? Essential hypertension 04/20/2007  ? Asthma 04/20/2007  ? CARPAL TUNNEL SYNDROME, HX OF 04/20/2007  ? ? ?PCP: Anastasia Pall, MD ? ?REFERRING PROVIDER: Marvis Moeller, NP ? ?REFERRING DIAG: M54.16 (ICD-10-CM) - Radiculopathy, lumbar  region ? ?THERAPY DIAG:  ?Other low back pain - Plan: PT plan of care cert/re-cert ? ?Difficulty walking - Plan: PT plan of care cert/re-cert ? ?Muscle weakness (generalized) - Plan: PT plan of care cert/re-cert ? ?ONSET DATE: 2022 ? ?SUBJECTIVE:                                                                                                                                                                                          ? ?SUBJECTIVE STATEMENT: ?Pt reports long history with back pain, multiple back surgeries, lumbar stimulator, and NT down the legs. She felt better after PT from 2022. After PT, the pain started back up again. Pt states she has thigh pain and NT on the L side at night- feels like itching with pain. She also has R sided HS pain- this is the main concern. She will have R sided foot pain as well. Pt states she does have issues with warm baths or showers- outside being the heat "exhausting myself" does worsen the myesthenia gravis. She will have days with hand and feel weakness. She reports it is "under control." She sees the neurologist 1x a year for her MG and she was "all clear." Pt feels she does not have energy and leans on her cart while at the grocery. Pt states she gets poor rest due to her sleeping, potentially sleep apnea.  ? ?Pt states she will feel like her legs give out on her depending on how much she has done throughout the day. She does feel her legs get weak as the day goes on- morning are better. Pt states she is mostly sedentary throughout the day. She has emotional stressors at home too that is not motivating for her to exercise. ? ?Pt reports she is getting the stimulator removed within the next month with [facility].  ? ?PERTINENT HISTORY:  ? ?Lumbar fusion L4-5, septo-optic dysplasia, Crohns disease and IBS, fibromyalgia, B-12 deficiency, chronic insomnia related to maladaptive sleep behaviors, chronic leukocytosis, chronic lumbago s/p spinal cord stimulator  placement in March 2019, seropositive, generalized myasthenia gravis  ? ?PAIN:  ?Are you having pain? Yes: NPRS scale: 2-3/10, Worst 7-8/10 ?Pain location: L/S, across; L ant thigh and R HS ?Pain description: aching, pulling, dull, deep ?Aggravating factors: sitting too long, standing too long, transfer, bending ?Relieving factors: exercise, heating pad,  ? ? ?PRECAUTIONS: None ? ?WEIGHT BEARING RESTRICTIONS No ? ?FALLS:  ?Has patient fallen in last 6 months? No ? ?LIVING ENVIRONMENT: ?Lives with: lives with their family and lives with their spouse ?Lives in: House/apartment ?Stairs: Yes but does not use them ?Has following equipment at home: Single point cane ? ?OCCUPATION: N/A ? ?PLOF: Independent with basic ADLs ? ?PATIENT GOALS :  ? ? ?OBJECTIVE:  ? ?DIAGNOSTIC FINDINGS:  ? ? ?PATIENT SURVEYS:  ?FOTO 35 ?9pts MCII ?41 pts DC ? ?SCREENING FOR RED FLAGS: ?Bowel or bladder incontinence: No ?Spinal tumors: No ?Cauda equina syndrome: No ?Compression fracture: No ? ?COGNITION: ? Overall cognitive status: Within functional limits for tasks assessed   ?  ?SENSATION: ?Light touch: Impaired  ?  Reflexes L3 and S1 2+ bilat ? ?POSTURE:  ?L SB in standing ? ?PALPATION: ?Bilat L/S hypertonicity in paraspinals in QL with TTP into R QL and glutes ? ?LUMBAR ROM:  ? ?Active  A/PROM  ?07/19/2021  ?Flexion 75%  ?Extension 50%  ?Right lateral flexion 60%  ?Left lateral flexion 60%  ?Right rotation 75%  ?Left rotation 75%  ? (Blank rows = not tested) ? ?LE ROM: moderately limited bilat all planes due to hip stiffness and body habitus; ? ?Knee and ankle ROM WFL for tasks assessed ? ?LE MMT: ? ?MMT Right ?07/19/2021 Left ?07/19/2021  ?Hip flexion 4/5 4/5  ?Hip extension 4/5 4/5  ?Hip abduction 4/5 4/5  ?Knee flexion 4/5 4/5  ?Knee extension 4/5 4/5  ?Ankle dorsiflexion 4+/5 4+/5  ? (Blank rows = not tested) ? ?LUMBAR SPECIAL TESTS:  ?Straight leg raise test: Positive, Slump test: Negative, and Trendelenburg sign: Positive SIJ compression and  distraction: positive ? ?FUNCTIONAL TESTS:  ?5 times sit to stand: unable to perform without UE assist ?Step up/step down: unable to perform due to pain following period of sitting for subjective testing ? ?GAIT: ?Distance

## 2021-08-18 ENCOUNTER — Encounter (HOSPITAL_BASED_OUTPATIENT_CLINIC_OR_DEPARTMENT_OTHER): Payer: Self-pay | Admitting: Physical Therapy

## 2021-08-18 ENCOUNTER — Ambulatory Visit (HOSPITAL_BASED_OUTPATIENT_CLINIC_OR_DEPARTMENT_OTHER): Payer: Medicare HMO | Attending: Neurosurgery | Admitting: Physical Therapy

## 2021-08-18 DIAGNOSIS — M6281 Muscle weakness (generalized): Secondary | ICD-10-CM | POA: Diagnosis present

## 2021-08-18 DIAGNOSIS — R262 Difficulty in walking, not elsewhere classified: Secondary | ICD-10-CM | POA: Insufficient documentation

## 2021-08-18 DIAGNOSIS — M545 Low back pain, unspecified: Secondary | ICD-10-CM | POA: Insufficient documentation

## 2021-08-18 DIAGNOSIS — M5459 Other low back pain: Secondary | ICD-10-CM | POA: Insufficient documentation

## 2021-08-18 DIAGNOSIS — G8929 Other chronic pain: Secondary | ICD-10-CM | POA: Diagnosis present

## 2021-08-18 NOTE — Therapy (Signed)
OUTPATIENT PHYSICAL THERAPY TREATMENT NOTE   Patient Name: Teresa Lee MRN: [id number] DOB:1960/09/26, 61 y.o., female Today's Date: 08/18/2021  PCP: Anastasia Pall, MD REFERRING PROVIDER: Marvis Moeller, NP  END OF SESSION:   PT End of Session - 08/18/21 0952     Visit Number 2    Number of Visits 16    Date for PT Re-Evaluation 10/17/21    Authorization Type Aetna MCR    PT Start Time 8003    PT Stop Time 1030    PT Time Calculation (min) 41 min    Activity Tolerance Patient tolerated treatment well;Patient limited by pain    Behavior During Therapy [facility] for tasks assessed/performed             Past Medical History:  Diagnosis Date   Arthritis    osteoarthritis   Asthma    Blind left eye    Cancer ([place])    skin   Crohn's disease ([place])    CTS (carpal tunnel syndrome)    DJD (degenerative joint disease)    Both cervical spine and LS spine   Fibromyalgia    GERD (gastroesophageal reflux disease)    Heart murmur    Herniated nucleus pulposus    Hyperlipidemia    Hypertension    IBS (irritable bowel syndrome)    Leukocytosis    Myasthenia gravis    Myasthenia gravis ([place])    Myasthenia gravis ([place])    Pneumonia    Spinal cord stimulator status    Past Surgical History:  Procedure Laterality Date   BACK SURGERY     BIOPSY  05/24/2020   Procedure: BIOPSY;  Surgeon: Ronald Lobo, MD;  Location: [place];  Service: Endoscopy;;   BREAST REDUCTION SURGERY     CARPAL TUNNEL RELEASE     bilateral   cervical disc inj     CHOLECYSTECTOMY     COLONOSCOPY WITH PROPOFOL N/A 05/24/2020   Procedure: COLONOSCOPY WITH PROPOFOL;  Surgeon: Ronald Lobo, MD;  Location: [place];  Service: Endoscopy;  Laterality: N/A;   ENDOMETRIAL ABLATION W/ NOVASURE     ESOPHAGOGASTRODUODENOSCOPY (EGD) WITH PROPOFOL N/A 05/24/2020   Procedure: ESOPHAGOGASTRODUODENOSCOPY (EGD) WITH PROPOFOL;  Surgeon: Ronald Lobo, MD;  Location: [place];  Service: Endoscopy;   Laterality: N/A;   LUMBAR LAMINECTOMY/DECOMPRESSION MICRODISCECTOMY Right 05/04/2020   Procedure: Right Lumbar Five Sacral One Microdiscectomy;  Surgeon: Erline Levine, MD;  Location: [place];  Service: Neurosurgery;  Laterality: Right;  posterior   MELANOMA EXCISION     NASAL SINUS SURGERY     SPINAL CORD STIMULATOR INSERTION  2019   THYMECTOMY     due to myastenia gravis   THYMECTOMY     TRANSFORAMINAL LUMBAR INTERBODY FUSION (TLIF) WITH PEDICLE SCREW FIXATION 1 LEVEL Right 05/04/2020   Procedure: Right Lumbar Four-Five Transforaminal lumbar interbody fusion;  Surgeon: Erline Levine, MD;  Location: [place];  Service: Neurosurgery;  Laterality: Right;  posterior   Patient Active Problem List   Diagnosis Date Noted   SIRS (systemic inflammatory response syndrome) ([place]) 06/03/2020   ARF (acute renal failure) ([place]) 05/22/2020   Diarrhea 05/22/2020   Nausea 05/22/2020   Spondylolisthesis of lumbar region 05/04/2020   Chronic pain disorder 08/12/2019   COVID-19    Acute on chronic renal failure ([place]) 02/16/2019   Insomnia disorder 02/27/2018   Fatigue 02/27/2018   Fibromyalgia syndrome 04/30/2017   RLS (restless legs syndrome) 12/26/2016   B12 deficiency 12/26/2016   Vitamin D deficiency, unspecified 12/26/2016  GERD (gastroesophageal reflux disease) 12/26/2016   Anxiety disorder 12/26/2016   Hyponatremia 10/24/2016   AKI (acute kidney injury) ([place]) 10/23/2016   Hypomagnesemia 10/22/2016   Hypophosphatemia 10/22/2016   Hypokalemia 10/21/2016   Wound infection after surgery 10/21/2016   Sepsis ([place]) 10/21/2016   Myasthenia gravis ([place]) 10/21/2016   Melanoma ([place]) 10/21/2016   Crohn disease ([place]) 10/21/2016   Cellulitis 10/21/2016   Palpitations 08/25/2013   Leukocytosis 05/25/2011   Hyperlipidemia 04/20/2007   Essential hypertension 04/20/2007   Asthma 04/20/2007   CARPAL TUNNEL SYNDROME, HX OF 04/20/2007    REFERRING DIAG: M54.16 (ICD-10-CM) - Radiculopathy, lumbar  region  THERAPY DIAG:  Other low back pain  Difficulty walking  Muscle weakness (generalized)  Rationale for Evaluation and Treatment Rehabilitation    PRECAUTIONS:  None   SUBJECTIVE: "I haven't been much of an exerciser in my life and I have gained a lot of weight"  PERTINENT HISTORY:    Lumbar fusion L4-5, septo-optic dysplasia, Crohns disease and IBS, fibromyalgia, B-12 deficiency, chronic insomnia related to maladaptive sleep behaviors, chronic leukocytosis, chronic lumbago s/p spinal cord stimulator placement in March 2019, seropositive, generalized myasthenia gravis    PAIN:  Are you having pain? Yes: NPRS scale: 2-3/10, Worst 7-8/10 Current 6/10 Pain location: L/S, across; L ant thigh and R HS Pain description: aching, pulling, dull, deep Aggravating factors: sitting too long, standing too long, transfer, bending Relieving factors: exercise, heating pad,      PRECAUTIONS: None   WEIGHT BEARING RESTRICTIONS No   FALLS:  Has patient fallen in last 6 months? No   LIVING ENVIRONMENT: Lives with: lives with their family and lives with their spouse Lives in: House/apartment Stairs: Yes but does not use them Has following equipment at home: Single point cane   OCCUPATION: N/A   PLOF: Independent with basic ADLs   PATIENT GOALS :      OBJECTIVE:    DIAGNOSTIC FINDINGS:      PATIENT SURVEYS:  FOTO 35 9pts MCII 41 pts DC   SCREENING FOR RED FLAGS: Bowel or bladder incontinence: No Spinal tumors: No Cauda equina syndrome: No Compression fracture: No   COGNITION:           Overall cognitive status: Within functional limits for tasks assessed                          SENSATION: Light touch: Impaired                        Reflexes L3 and S1 2+ bilat   POSTURE:  L SB in standing   PALPATION: Bilat L/S hypertonicity in paraspinals in QL with TTP into R QL and glutes   LUMBAR ROM:    Active  A/PROM  07/19/2021  Flexion 75%  Extension 50%   Right lateral flexion 60%  Left lateral flexion 60%  Right rotation 75%  Left rotation 75%   (Blank rows = not tested)   LE ROM: moderately limited bilat all planes due to hip stiffness and body habitus;   Knee and ankle ROM WFL for tasks assessed   LE MMT:   MMT Right 07/19/2021 Left 07/19/2021  Hip flexion 4/5 4/5  Hip extension 4/5 4/5  Hip abduction 4/5 4/5  Knee flexion 4/5 4/5  Knee extension 4/5 4/5  Ankle dorsiflexion 4+/5 4+/5   (Blank rows = not tested)   LUMBAR SPECIAL TESTS:  Straight leg raise test:  Positive, Slump test: Negative, and Trendelenburg sign: Positive SIJ compression and distraction: positive   FUNCTIONAL TESTS:  5 times sit to stand: unable to perform without UE assist Step up/step down: unable to perform due to pain following period of sitting for subjective testing   GAIT: Distance walked: 83f Assistive device utilized: None Level of assistance: Complete Independence Comments: antalgic, R LE circumduction, bilat Trendelenburg       TODAY'S TREATMENT    Pt seen for aquatic therapy today.  Treatment took place in water 3.25-4.8 ft [facility] Temp of water was 91.  Pt entered/exited the pool via stairs step through pattern independently with bilat rail.  Intro to setting Walking forward, backward and side stepping in 3 ft multiple reps  Seated: LB stretch seated on bench with feet on step 3 x 30s  Standing Balance: Standing:holding to yellow noodle then progressed to yellow hand buoys:small BOS; tandem stance R/L Stretching : LB in hip hinge position 4 x 30s hold Lumbar rotation  R/L x 12: Core strengthening: kick board push downs 3x12; Kick board push/pull 3x10   Pt requires buoyancy for support and to offload joints with strengthening exercises. Viscosity of the water is needed for resistance of strengthening; water current perturbations provides challenge to standing balance unsupported, requiring  increased core activation.       PATIENT EDUCATION:  Education details: pain neuro-science, chronic pain management, walking program, increasing NEAT, diagnosis, prognosis, anatomy, exercise progression, DOMS expectations, muscle firing,  envelope of function, HEP, POC   Person educated: Patient Education method: Explanation, Demonstration, Tactile cues, Verbal cues, and Handouts Education comprehension: verbalized understanding, returned demonstration, verbal cues required, and tactile cues required     HOME EXERCISE PROGRAM: Access Code: [email or web address] URL: [email or web address] Date: 07/19/2021 Prepared by: ADaleen Bo  ASSESSMENT:   CLINICAL IMPRESSION: Pt introduced to setting. She is safe and indep  submerged.  She is directed through gentle stretching, strengthening and balance challenges with instruction /edu on neutral core/upright positioning and core stabilization. She has difficulty with static tandem stance  having minor unsteadiness without LOB. Balance  does improves with trials..   Patient is a 61y.o. female who was seen today for physical therapy evaluation and treatment for cc of chronic LBP. Pt has significant PMH as listed above that are all major contributing factors to her pain. Pt also has sedentary lifestyle that likely exacerbates her pain with static positioning. Pt is largely limited by functional mobility and general functional capacity. Pt would benefit from continued skilled therapy in order to reach goals and maximize functional lumbopelvic strength and ROM for  prevention of further functional decline.      OBJECTIVE IMPAIRMENTS Abnormal gait, decreased activity tolerance, decreased balance, decreased endurance, decreased mobility, difficulty walking, decreased ROM, decreased strength, hypomobility, increased muscle spasms, impaired flexibility, improper body mechanics, postural dysfunction, and pain.    ACTIVITY LIMITATIONS cleaning, community  activity, driving, yard work, shopping, and [employer]    PERSONAL FACTORS Age, Behavior pattern, Fitness, Past/current experiences, Time since onset of injury/illness/exacerbation, and 3+ comorbidities:  are also affecting patient's functional outcome.      REHAB POTENTIAL: Fair    CLINICAL DECISION MAKING: Evolving/moderate complexity   EVALUATION COMPLEXITY: Moderate     GOALS:     SHORT TERM GOALS: Target date: 08/30/2021   Pt will become independent with HEP in order to demonstrate synthesis of PT education.     Goal status: INITIAL   2.  Pt  will report at least 2 pt reduction on NPRS scale for pain in order to demonstrate functional improvement with household activity, self care, and ADL.    Goal status: INITIAL   3.  Pt will score at least 9 pt increase on FOTO to demonstrate functional improvement in MCII and pt perceived function.     Goal status: INITIAL     LONG TERM GOALS: Target date: 10/11/2021   Pt  will become independent with final HEP in order to demonstrate synthesis of PT education.     Goal status: INITIAL   2.  Pt will be able to perform 5XSTS in under 12s  in order to demonstrate functional improvement above the cut off score for adults.    Goal status: INITIAL   3.  Pt will be able to demonstrate/report ability to walk >20 mins without pain in order to demonstrate functional improvement and tolerance to exercise and community mobility.    Goal status: INITIAL   4.  Pt will score >/= 41 on FOTO to demonstrate improvement in perceived lumbopelvic function.    Goal status: INITIAL   5.  Pt will be able to demonstrate/report ability to sit/stand/sleep for extended periods of time without pain in order to demonstrate functional improvement and tolerance to static positioning.    Goal status: INITIAL   PLAN: PT FREQUENCY: 1-2x/week   PT DURATION: 12 weeks   PLANNED INTERVENTIONS: Therapeutic exercises, Therapeutic activity,  Neuromuscular re-education, Balance training, Gait training, Patient/Family education, Joint manipulation, Joint mobilization, Stair training, Orthotic/Fit training, DME instructions, Aquatic Therapy, Dry Needling, Electrical stimulation, Spinal manipulation, Spinal mobilization, Cryotherapy, Moist heat, scar mobilization, Splintting, Taping, Vasopneumatic device, Traction, Ultrasound, Ionotophoresis 96m/ml Dexamethasone, and Manual therapy.   PLAN FOR NEXT SESSION:  general lumbar/hip stretching and strengthening, pt does have MG- may need to use cold pool     [place](FTharon Aquas Koran Seabrook MPT 08/18/2021, 9:59 AM

## 2021-08-23 ENCOUNTER — Encounter (HOSPITAL_BASED_OUTPATIENT_CLINIC_OR_DEPARTMENT_OTHER): Payer: Self-pay | Admitting: Physical Therapy

## 2021-08-23 ENCOUNTER — Ambulatory Visit (HOSPITAL_BASED_OUTPATIENT_CLINIC_OR_DEPARTMENT_OTHER): Payer: Medicare HMO | Admitting: Physical Therapy

## 2021-08-23 DIAGNOSIS — M5459 Other low back pain: Secondary | ICD-10-CM

## 2021-08-23 DIAGNOSIS — M6281 Muscle weakness (generalized): Secondary | ICD-10-CM

## 2021-08-23 DIAGNOSIS — R262 Difficulty in walking, not elsewhere classified: Secondary | ICD-10-CM

## 2021-08-23 NOTE — Therapy (Signed)
OUTPATIENT PHYSICAL THERAPY TREATMENT NOTE   Patient Name: Teresa Lee MRN: [id number] DOB:[birth year], 61 y.o., female Today's Date: 08/23/2021  PCP: Anastasia Pall, MD REFERRING PROVIDER: Marvis Moeller, NP  END OF SESSION:   PT End of Session - 08/23/21 1142     Visit Number 3    Number of Visits 16    Date for PT Re-Evaluation 10/17/21    Authorization Type [employer] MCR    PT Start Time 1141    PT Stop Time 1225    PT Time Calculation (min) 44 min    Activity Tolerance Patient tolerated treatment well    Behavior During Therapy WFL for tasks assessed/performed             Past Medical History:  Diagnosis Date   Arthritis    osteoarthritis   Asthma    Blind left eye    Cancer ([place])    skin   Crohn's disease ([place])    CTS (carpal tunnel syndrome)    DJD (degenerative joint disease)    Both cervical spine and LS spine   Fibromyalgia    GERD (gastroesophageal reflux disease)    Heart murmur    Herniated nucleus pulposus    Hyperlipidemia    Hypertension    IBS (irritable bowel syndrome)    Leukocytosis    Myasthenia gravis    Myasthenia gravis ([place])    Myasthenia gravis ([place])    Pneumonia    Spinal cord stimulator status    Past Surgical History:  Procedure Laterality Date   BACK SURGERY     BIOPSY  05/24/2020   Procedure: BIOPSY;  Surgeon: Ronald Lobo, MD;  Location: [facility];  Service: Endoscopy;;   BREAST REDUCTION SURGERY     CARPAL TUNNEL RELEASE     bilateral   cervical disc inj     CHOLECYSTECTOMY     COLONOSCOPY WITH PROPOFOL N/A 05/24/2020   Procedure: COLONOSCOPY WITH PROPOFOL;  Surgeon: Ronald Lobo, MD;  Location: [facility];  Service: Endoscopy;  Laterality: N/A;   ENDOMETRIAL ABLATION W/ NOVASURE     ESOPHAGOGASTRODUODENOSCOPY (EGD) WITH PROPOFOL N/A 05/24/2020   Procedure: ESOPHAGOGASTRODUODENOSCOPY (EGD) WITH PROPOFOL;  Surgeon: Ronald Lobo, MD;  Location: [facility];  Service: Endoscopy;  Laterality: N/A;    LUMBAR LAMINECTOMY/DECOMPRESSION MICRODISCECTOMY Right 05/04/2020   Procedure: Right Lumbar Five Sacral One Microdiscectomy;  Surgeon: Erline Levine, MD;  Location: [facility];  Service: Neurosurgery;  Laterality: Right;  posterior   MELANOMA EXCISION     NASAL SINUS SURGERY     SPINAL CORD STIMULATOR INSERTION  2019   THYMECTOMY     due to myastenia gravis   THYMECTOMY     TRANSFORAMINAL LUMBAR INTERBODY FUSION (TLIF) WITH PEDICLE SCREW FIXATION 1 LEVEL Right 05/04/2020   Procedure: Right Lumbar Four-Five Transforaminal lumbar interbody fusion;  Surgeon: Erline Levine, MD;  Location: [facility];  Service: Neurosurgery;  Laterality: Right;  posterior   Patient Active Problem List   Diagnosis Date Noted   SIRS (systemic inflammatory response syndrome) ([place]) 06/03/2020   ARF (acute renal failure) ([place]) 05/22/2020   Diarrhea 05/22/2020   Nausea 05/22/2020   Spondylolisthesis of lumbar region 05/04/2020   Chronic pain disorder 08/12/2019   COVID-19    Acute on chronic renal failure ([place]) 02/16/2019   Insomnia disorder 02/27/2018   Fatigue 02/27/2018   Fibromyalgia syndrome 04/30/2017   RLS (restless legs syndrome) 12/26/2016   B12 deficiency 12/26/2016   Vitamin D deficiency, unspecified 12/26/2016   GERD (gastroesophageal reflux  disease) 12/26/2016   Anxiety disorder 12/26/2016   Hyponatremia 10/24/2016   AKI (acute kidney injury) ([place]) 10/23/2016   Hypomagnesemia 10/22/2016   Hypophosphatemia 10/22/2016   Hypokalemia 10/21/2016   Wound infection after surgery 10/21/2016   Sepsis ([place]) 10/21/2016   Myasthenia gravis ([place]) 10/21/2016   Melanoma ([place]) 10/21/2016   Crohn disease ([place]) 10/21/2016   Cellulitis 10/21/2016   Palpitations 08/25/2013   Leukocytosis 05/25/2011   Hyperlipidemia 04/20/2007   Essential hypertension 04/20/2007   Asthma 04/20/2007   CARPAL TUNNEL SYNDROME, HX OF 04/20/2007    REFERRING DIAG: M54.16 (ICD-10-CM) - Radiculopathy, lumbar region  THERAPY DIAG:  Other  low back pain  Difficulty walking  Muscle weakness (generalized)  Rationale for Evaluation and Treatment Rehabilitation    PRECAUTIONS:  None   SUBJECTIVE: "I was exhausted after the session."  She reports she was sore, but "not too bad".   She notes that the most tiring exercise was the cycling on yellow noodle.   PERTINENT HISTORY:    Lumbar fusion L4-5, septo-optic dysplasia, Crohns disease and IBS, fibromyalgia, B-12 deficiency, chronic insomnia related to maladaptive sleep behaviors, chronic leukocytosis, chronic lumbago s/p spinal cord stimulator placement in March 2019, seropositive, generalized myasthenia gravis    PAIN:  Are you having pain? yes - took pain medicine 30 min prior to session Current 3/10 Pain location: generalized Pain description: aching, pulling, dull, deep Aggravating factors: sitting too long, standing too long, transfer, bending Relieving factors: exercise, heating pad,      PRECAUTIONS: None   WEIGHT BEARING RESTRICTIONS No   FALLS:  Has patient fallen in last 6 months? No   LIVING ENVIRONMENT: Lives with: lives with their family and lives with their spouse Lives in: House/apartment Stairs: Yes but does not use them Has following equipment at home: Single point cane   OCCUPATION: N/A   PLOF: Independent with basic ADLs   PATIENT GOALS :      OBJECTIVE:    DIAGNOSTIC FINDINGS:      PATIENT SURVEYS:  FOTO 35 9pts MCII 41 pts DC   SCREENING FOR RED FLAGS: Bowel or bladder incontinence: No Spinal tumors: No Cauda equina syndrome: No Compression fracture: No   COGNITION:           Overall cognitive status: Within functional limits for tasks assessed                          SENSATION: Light touch: Impaired                        Reflexes L3 and S1 2+ bilat   POSTURE:  L SB in standing   PALPATION: Bilat L/S hypertonicity in paraspinals in QL with TTP into R QL and glutes   LUMBAR ROM:    Active  A/PROM  07/19/2021   Flexion 75%  Extension 50%  Right lateral flexion 60%  Left lateral flexion 60%  Right rotation 75%  Left rotation 75%   (Blank rows = not tested)   LE ROM: moderately limited bilat all planes due to hip stiffness and body habitus;   Knee and ankle ROM WFL for tasks assessed   LE MMT:   MMT Right 07/19/2021 Left 07/19/2021  Hip flexion 4/5 4/5  Hip extension 4/5 4/5  Hip abduction 4/5 4/5  Knee flexion 4/5 4/5  Knee extension 4/5 4/5  Ankle dorsiflexion 4+/5 4+/5   (Blank rows = not tested)  LUMBAR SPECIAL TESTS:  Straight leg raise test: Positive, Slump test: Negative, and Trendelenburg sign: Positive SIJ compression and distraction: positive   FUNCTIONAL TESTS:  5 times sit to stand: unable to perform without UE assist Step up/step down: unable to perform due to pain following period of sitting for subjective testing   GAIT: Distance walked: 67f Assistive device utilized: None Level of assistance: Complete Independence Comments: antalgic, R LE circumduction, bilat Trendelenburg       TODAY'S TREATMENT    Pt seen for aquatic therapy today.  Treatment took place in water 3.25-4.8 ft [facility] Temp of water was 91.  Pt entered/exited the pool via stairs step through pattern independently with bilat rail.   -Holding onto yellow noodle:  Walking forward, backward and side stepping -Holding wall:  squats, heel/toe raises -Seated on bench with blue step under feet: ab set barely submerging blue noodle x 5s x 10;  STS x 10;  Kick board push/pull x10 x 2; LB stretch with hands on kickboard; repeated to L/R  - seated on yellow noodle with yellow hand buoys:  cycling (forward) x 45 sec x 3 (with standing rest breaks in between).  -Holding yellow noodle:  slow forward/ backward walk for rest and recovery - supported back float (yellow noodle under arms/ blue noodle under knees) to decompress spine  Pt requires buoyancy for support and to  offload joints with strengthening exercises. Viscosity of the water is needed for resistance of strengthening; water current perturbations provides challenge to standing balance unsupported, requiring increased core activation.       PATIENT EDUCATION:  Education details: pain neuro-science, chronic pain management, walking program, increasing NEAT, diagnosis, prognosis, anatomy, exercise progression, DOMS expectations, muscle firing,  envelope of function, HEP, POC   Person educated: Patient Education method: Explanation, Demonstration, Tactile cues, Verbal cues, and Handouts Education comprehension: verbalized understanding, returned demonstration, verbal cues required, and tactile cues required     HOME EXERCISE PROGRAM: Access Code: [email or web address] URL: [email or web address] Date: 07/19/2021 Prepared by: ADaleen Bo  ASSESSMENT:   CLINICAL IMPRESSION: Pt reported increased balance with gait half way through session.  Frequent cues to slow speed of exercise and take rest breaks to avoid over-exertion.  Her LE fatigued with short bursts of cycling; may benefit from decreasing this to 30 sec instead of 45 sec.  No increase in LBP during session.  Goals are ongoing.    Patient is a 61y.o. female who was seen today for physical therapy evaluation and treatment for cc of chronic LBP. Pt has significant PMH as listed above that are all major contributing factors to her pain. Pt also has sedentary lifestyle that likely exacerbates her pain with static positioning. Pt is largely limited by functional mobility and general functional capacity. Pt would benefit from continued skilled therapy in order to reach goals and maximize functional lumbopelvic strength and ROM for  prevention of further functional decline.      OBJECTIVE IMPAIRMENTS Abnormal gait, decreased activity tolerance, decreased balance, decreased endurance, decreased mobility, difficulty walking, decreased ROM, decreased  strength, hypomobility, increased muscle spasms, impaired flexibility, improper body mechanics, postural dysfunction, and pain.    ACTIVITY LIMITATIONS cleaning, community activity, driving, yard work, shopping, and [employer]    PERSONAL FACTORS Age, Behavior pattern, Fitness, Past/current experiences, Time since onset of injury/illness/exacerbation, and 3+ comorbidities:  are also affecting patient's functional outcome.      REHAB POTENTIAL: Fair    CLINICAL DECISION MAKING: Evolving/moderate  complexity   EVALUATION COMPLEXITY: Moderate     GOALS:     SHORT TERM GOALS: Target date: 08/30/2021   Pt will become independent with HEP in order to demonstrate synthesis of PT education.     Goal status: INITIAL   2.  Pt will report at least 2 pt reduction on NPRS scale for pain in order to demonstrate functional improvement with household activity, self care, and ADL.    Goal status: INITIAL   3.  Pt will score at least 9 pt increase on FOTO to demonstrate functional improvement in MCII and pt perceived function.     Goal status: INITIAL     LONG TERM GOALS: Target date: 10/11/2021   Pt  will become independent with final HEP in order to demonstrate synthesis of PT education.     Goal status: INITIAL   2.  Pt will be able to perform 5XSTS in under 12s  in order to demonstrate functional improvement above the cut off score for adults.    Goal status: INITIAL   3.  Pt will be able to demonstrate/report ability to walk >20 mins without pain in order to demonstrate functional improvement and tolerance to exercise and community mobility.    Goal status: INITIAL   4.  Pt will score >/= 41 on FOTO to demonstrate improvement in perceived lumbopelvic function.    Goal status: INITIAL   5.  Pt will be able to demonstrate/report ability to sit/stand/sleep for extended periods of time without pain in order to demonstrate functional improvement and tolerance to static  positioning.    Goal status: INITIAL   PLAN: PT FREQUENCY: 1-2x/week   PT DURATION: 12 weeks   PLANNED INTERVENTIONS: Therapeutic exercises, Therapeutic activity, Neuromuscular re-education, Balance training, Gait training, Patient/Family education, Joint manipulation, Joint mobilization, Stair training, Orthotic/Fit training, DME instructions, Aquatic Therapy, Dry Needling, Electrical stimulation, Spinal manipulation, Spinal mobilization, Cryotherapy, Moist heat, scar mobilization, Splintting, Taping, Vasopneumatic device, Traction, Ultrasound, Ionotophoresis 80m/ml Dexamethasone, and Manual therapy.   PLAN FOR NEXT SESSION:  general lumbar/hip stretching and strengthening, pt does have MG- may need to use cold pool   [facility] PTA 08/23/21 1:31 PM

## 2021-08-25 ENCOUNTER — Ambulatory Visit (HOSPITAL_BASED_OUTPATIENT_CLINIC_OR_DEPARTMENT_OTHER): Payer: Medicare HMO | Admitting: Physical Therapy

## 2021-08-25 ENCOUNTER — Encounter (HOSPITAL_BASED_OUTPATIENT_CLINIC_OR_DEPARTMENT_OTHER): Payer: Self-pay | Admitting: Physical Therapy

## 2021-08-25 DIAGNOSIS — R262 Difficulty in walking, not elsewhere classified: Secondary | ICD-10-CM

## 2021-08-25 DIAGNOSIS — M5459 Other low back pain: Secondary | ICD-10-CM

## 2021-08-25 DIAGNOSIS — M545 Low back pain, unspecified: Secondary | ICD-10-CM

## 2021-08-25 DIAGNOSIS — M6281 Muscle weakness (generalized): Secondary | ICD-10-CM

## 2021-08-25 NOTE — Therapy (Signed)
OUTPATIENT PHYSICAL THERAPY TREATMENT NOTE   Patient Name: Teresa Lee MRN: [id number] DOB:Sep 13, 1960, 61 y.o., female Today's Date: 08/25/2021  PCP: Anastasia Pall, MD REFERRING PROVIDER: Marvis Moeller, NP  END OF SESSION:   PT End of Session - 08/25/21 1149     Visit Number 4    Number of Visits 16    Date for PT Re-Evaluation 10/17/21    Authorization Type [employer] MCR    PT Start Time 1146    PT Stop Time 1228    PT Time Calculation (min) 42 min    Activity Tolerance Patient tolerated treatment well    Behavior During Therapy WFL for tasks assessed/performed             Past Medical History:  Diagnosis Date   Arthritis    osteoarthritis   Asthma    Blind left eye    Cancer ([place])    skin   Crohn's disease ([place])    CTS (carpal tunnel syndrome)    DJD (degenerative joint disease)    Both cervical spine and LS spine   Fibromyalgia    GERD (gastroesophageal reflux disease)    Heart murmur    Herniated nucleus pulposus    Hyperlipidemia    Hypertension    IBS (irritable bowel syndrome)    Leukocytosis    Myasthenia gravis    Myasthenia gravis ([place])    Myasthenia gravis ([place])    Pneumonia    Spinal cord stimulator status    Past Surgical History:  Procedure Laterality Date   BACK SURGERY     BIOPSY  05/24/2020   Procedure: BIOPSY;  Surgeon: Ronald Lobo, MD;  Location: [facility];  Service: Endoscopy;;   BREAST REDUCTION SURGERY     CARPAL TUNNEL RELEASE     bilateral   cervical disc inj     CHOLECYSTECTOMY     COLONOSCOPY WITH PROPOFOL N/A 05/24/2020   Procedure: COLONOSCOPY WITH PROPOFOL;  Surgeon: Ronald Lobo, MD;  Location: [facility];  Service: Endoscopy;  Laterality: N/A;   ENDOMETRIAL ABLATION W/ NOVASURE     ESOPHAGOGASTRODUODENOSCOPY (EGD) WITH PROPOFOL N/A 05/24/2020   Procedure: ESOPHAGOGASTRODUODENOSCOPY (EGD) WITH PROPOFOL;  Surgeon: Ronald Lobo, MD;  Location: [facility];  Service: Endoscopy;  Laterality: N/A;    LUMBAR LAMINECTOMY/DECOMPRESSION MICRODISCECTOMY Right 05/04/2020   Procedure: Right Lumbar Five Sacral One Microdiscectomy;  Surgeon: Erline Levine, MD;  Location: [facility];  Service: Neurosurgery;  Laterality: Right;  posterior   MELANOMA EXCISION     NASAL SINUS SURGERY     SPINAL CORD STIMULATOR INSERTION  2019   THYMECTOMY     due to myastenia gravis   THYMECTOMY     TRANSFORAMINAL LUMBAR INTERBODY FUSION (TLIF) WITH PEDICLE SCREW FIXATION 1 LEVEL Right 05/04/2020   Procedure: Right Lumbar Four-Five Transforaminal lumbar interbody fusion;  Surgeon: Erline Levine, MD;  Location: [facility];  Service: Neurosurgery;  Laterality: Right;  posterior   Patient Active Problem List   Diagnosis Date Noted   SIRS (systemic inflammatory response syndrome) ([place]) 06/03/2020   ARF (acute renal failure) ([place]) 05/22/2020   Diarrhea 05/22/2020   Nausea 05/22/2020   Spondylolisthesis of lumbar region 05/04/2020   Chronic pain disorder 08/12/2019   COVID-19    Acute on chronic renal failure ([place]) 02/16/2019   Insomnia disorder 02/27/2018   Fatigue 02/27/2018   Fibromyalgia syndrome 04/30/2017   RLS (restless legs syndrome) 12/26/2016   B12 deficiency 12/26/2016   Vitamin D deficiency, unspecified 12/26/2016   GERD (gastroesophageal reflux  disease) 12/26/2016   Anxiety disorder 12/26/2016   Hyponatremia 10/24/2016   AKI (acute kidney injury) ([place]) 10/23/2016   Hypomagnesemia 10/22/2016   Hypophosphatemia 10/22/2016   Hypokalemia 10/21/2016   Wound infection after surgery 10/21/2016   Sepsis ([place]) 10/21/2016   Myasthenia gravis ([place]) 10/21/2016   Melanoma ([place]) 10/21/2016   Crohn disease ([place]) 10/21/2016   Cellulitis 10/21/2016   Palpitations 08/25/2013   Leukocytosis 05/25/2011   Hyperlipidemia 04/20/2007   Essential hypertension 04/20/2007   Asthma 04/20/2007   CARPAL TUNNEL SYNDROME, HX OF 04/20/2007    REFERRING DIAG: M54.16 (ICD-10-CM) - Radiculopathy, lumbar region  THERAPY DIAG:  Other  low back pain  Difficulty walking  Muscle weakness (generalized)  Chronic bilateral low back pain, unspecified whether sciatica present  Rationale for Evaluation and Treatment Rehabilitation    PRECAUTIONS:  None   SUBJECTIVE:  Pt reports she had some soreness in her hips/back in  the evening of last session.  She wasn't as exhausted this time, as she was after the first session.  She didn't take pain medicine before session like she did prior to previous session.   PERTINENT HISTORY:    Lumbar fusion L4-5, septo-optic dysplasia, Crohns disease and IBS, fibromyalgia, B-12 deficiency, chronic insomnia related to maladaptive sleep behaviors, chronic leukocytosis, chronic lumbago s/p spinal cord stimulator placement in March 2019, seropositive, generalized myasthenia gravis    PAIN:  Are you having pain? yes Current 5/10 Pain location: Rt lower back  Pain description: aching, pulling, dull, deep Aggravating factors: sitting too long, standing too long, transfer, bending Relieving factors: exercise, heating pad,      PRECAUTIONS: None   WEIGHT BEARING RESTRICTIONS No   FALLS:  Has patient fallen in last 6 months? No   LIVING ENVIRONMENT: Lives with: lives with their family and lives with their spouse Lives in: House/apartment Stairs: Yes but does not use them Has following equipment at home: Single point cane   OCCUPATION: N/A   PLOF: Independent with basic ADLs   PATIENT GOALS :      OBJECTIVE:    DIAGNOSTIC FINDINGS:      PATIENT SURVEYS:  FOTO 35 9pts MCII 41 pts DC   SCREENING FOR RED FLAGS: Bowel or bladder incontinence: No Spinal tumors: No Cauda equina syndrome: No Compression fracture: No   COGNITION:           Overall cognitive status: Within functional limits for tasks assessed                          SENSATION: Light touch: Impaired                        Reflexes L3 and S1 2+ bilat   POSTURE:  L SB in standing   PALPATION: Bilat  L/S hypertonicity in paraspinals in QL with TTP into R QL and glutes   LUMBAR ROM:    Active  A/PROM  07/19/2021  Flexion 75%  Extension 50%  Right lateral flexion 60%  Left lateral flexion 60%  Right rotation 75%  Left rotation 75%   (Blank rows = not tested)   LE ROM: moderately limited bilat all planes due to hip stiffness and body habitus;   Knee and ankle ROM WFL for tasks assessed   LE MMT:   MMT Right 07/19/2021 Left 07/19/2021  Hip flexion 4/5 4/5  Hip extension 4/5 4/5  Hip abduction 4/5 4/5  Knee flexion 4/5 4/5  Knee extension 4/5 4/5  Ankle dorsiflexion 4+/5 4+/5   (Blank rows = not tested)   LUMBAR SPECIAL TESTS:  Straight leg raise test: Positive, Slump test: Negative, and Trendelenburg sign: Positive SIJ compression and distraction: positive   FUNCTIONAL TESTS:  5 times sit to stand: unable to perform without UE assist Step up/step down: unable to perform due to pain following period of sitting for subjective testing   GAIT: Distance walked: 87f Assistive device utilized: None Level of assistance: Complete Independence Comments: antalgic, R LE circumduction, bilat Trendelenburg       TODAY'S TREATMENT    Pt seen for aquatic therapy today.  Treatment took place in water 3.25-4.8 ft [facility] Temp of water was 91.  Pt entered/exited the pool via stairs step through pattern independently with bilat rail.   -without support: Walking forward, backward and side stepping -Holding wall:  squats, heel/toe raises; hip abdct/ add - Holding yellow Noodle:  hip abdct/knee flexion;  hip/knee flexion crossing midline;  forward high knee march - holding rainbow hand buoys under water: forward/ backward walk -Seated on bench with blue step under feet: STS x 10; blue noodle pull down to lap x 5; repeated in standing x 10  - seated on yellow noodle with yellow hand buoys:  cycling (forward) x 60 sec x 4 (with 1 min seated rest breaks in  between each rep).  - supported back float (yellow noodle under arms/ blue noodle under knees, nekdoodle) to decompress spine, 2 min  Pt requires buoyancy for support and to offload joints with strengthening exercises. Viscosity of the water is needed for resistance of strengthening; water current perturbations provides challenge to standing balance unsupported, requiring increased core activation.   PATIENT EDUCATION:  Education details: pain neuro-science, chronic pain management, walking program, increasing NEAT, diagnosis, prognosis, anatomy, exercise progression, DOMS expectations, muscle firing,  envelope of function, HEP, POC   Person educated: Patient Education method: Explanation, Demonstration, Tactile cues, Verbal cues, and Handouts Education comprehension: verbalized understanding, returned demonstration, verbal cues required, and tactile cues required     HOME EXERCISE PROGRAM: Access Code: [email or web address] URL: [email or web address] Date: 07/19/2021 Prepared by: ADaleen Bo  ASSESSMENT:   CLINICAL IMPRESSION: Improved balance upon entry to pool; no need for UE support with gait.  Less required cues to slow speed of exercise.  Pt's LE didn't fatigue as quickly with cycling today; improved pacing.   No increase in LBP during session. Water temp of 91 has been tolerated well.  Goals are ongoing.    Patient is a 61y.o. female who was seen today for physical therapy evaluation and treatment for cc of chronic LBP. Pt has significant PMH as listed above that are all major contributing factors to her pain. Pt also has sedentary lifestyle that likely exacerbates her pain with static positioning. Pt is largely limited by functional mobility and general functional capacity. Pt would benefit from continued skilled therapy in order to reach goals and maximize functional lumbopelvic strength and ROM for  prevention of further functional decline.      OBJECTIVE IMPAIRMENTS Abnormal  gait, decreased activity tolerance, decreased balance, decreased endurance, decreased mobility, difficulty walking, decreased ROM, decreased strength, hypomobility, increased muscle spasms, impaired flexibility, improper body mechanics, postural dysfunction, and pain.    ACTIVITY LIMITATIONS cleaning, community activity, driving, yard work, shopping, and [employer]    PERSONAL FACTORS Age, Behavior pattern, Fitness, Past/current experiences, Time since onset of injury/illness/exacerbation, and 3+ comorbidities:  are  also affecting patient's functional outcome.      REHAB POTENTIAL: Fair    CLINICAL DECISION MAKING: Evolving/moderate complexity   EVALUATION COMPLEXITY: Moderate     GOALS:     SHORT TERM GOALS: Target date: 08/30/2021   Pt will become independent with HEP in order to demonstrate synthesis of PT education.     Goal status: INITIAL   2.  Pt will report at least 2 pt reduction on NPRS scale for pain in order to demonstrate functional improvement with household activity, self care, and ADL.    Goal status: INITIAL   3.  Pt will score at least 9 pt increase on FOTO to demonstrate functional improvement in MCII and pt perceived function.     Goal status: INITIAL     LONG TERM GOALS: Target date: 10/11/2021   Pt  will become independent with final HEP in order to demonstrate synthesis of PT education.     Goal status: INITIAL   2.  Pt will be able to perform 5XSTS in under 12s  in order to demonstrate functional improvement above the cut off score for adults.    Goal status: INITIAL   3.  Pt will be able to demonstrate/report ability to walk >20 mins without pain in order to demonstrate functional improvement and tolerance to exercise and community mobility.    Goal status: INITIAL   4.  Pt will score >/= 41 on FOTO to demonstrate improvement in perceived lumbopelvic function.    Goal status: INITIAL   5.  Pt will be able to demonstrate/report  ability to sit/stand/sleep for extended periods of time without pain in order to demonstrate functional improvement and tolerance to static positioning.    Goal status: INITIAL   PLAN: PT FREQUENCY: 1-2x/week   PT DURATION: 12 weeks   PLANNED INTERVENTIONS: Therapeutic exercises, Therapeutic activity, Neuromuscular re-education, Balance training, Gait training, Patient/Family education, Joint manipulation, Joint mobilization, Stair training, Orthotic/Fit training, DME instructions, Aquatic Therapy, Dry Needling, Electrical stimulation, Spinal manipulation, Spinal mobilization, Cryotherapy, Moist heat, scar mobilization, Splintting, Taping, Vasopneumatic device, Traction, Ultrasound, Ionotophoresis 77m/ml Dexamethasone, and Manual therapy.   PLAN FOR NEXT SESSION:  general lumbar/hip stretching and strengthening, pt does have MG- may need to use cold pool   JKerin Perna PTA 08/25/21 12:35 PM

## 2021-08-30 ENCOUNTER — Ambulatory Visit (HOSPITAL_BASED_OUTPATIENT_CLINIC_OR_DEPARTMENT_OTHER): Payer: Medicare HMO | Admitting: Physical Therapy

## 2021-09-01 ENCOUNTER — Ambulatory Visit (HOSPITAL_BASED_OUTPATIENT_CLINIC_OR_DEPARTMENT_OTHER): Payer: Medicare HMO | Admitting: Physical Therapy

## 2021-09-05 ENCOUNTER — Ambulatory Visit (HOSPITAL_BASED_OUTPATIENT_CLINIC_OR_DEPARTMENT_OTHER): Payer: Medicare HMO | Admitting: Physical Therapy

## 2021-09-05 ENCOUNTER — Encounter (HOSPITAL_BASED_OUTPATIENT_CLINIC_OR_DEPARTMENT_OTHER): Payer: Self-pay | Admitting: Physical Therapy

## 2021-09-05 DIAGNOSIS — M5459 Other low back pain: Secondary | ICD-10-CM | POA: Diagnosis not present

## 2021-09-05 DIAGNOSIS — M6281 Muscle weakness (generalized): Secondary | ICD-10-CM

## 2021-09-05 DIAGNOSIS — G8929 Other chronic pain: Secondary | ICD-10-CM

## 2021-09-05 DIAGNOSIS — R262 Difficulty in walking, not elsewhere classified: Secondary | ICD-10-CM

## 2021-09-05 NOTE — Therapy (Addendum)
OUTPATIENT PHYSICAL THERAPY TREATMENT NOTE PHYSICAL THERAPY DISCHARGE SUMMARY  Visits from Start of Care: 5  Current functional level related to goals / functional outcomes: N/a   Remaining deficits: all   Education / Equipment: Discussed eval findings, rehab rationale and POC     Patient agrees to discharge. Patient goals were not met. Patient is being discharged due to not returning since the last visit.   Patient Name: Teresa Lee MRN: [id number] DOB:1961-01-31, 61 y.o., female Today's Date: 09/05/2021  PCP: Anastasia Pall, MD REFERRING PROVIDER: Marvis Moeller, NP  END OF SESSION:   PT End of Session - 09/05/21 1030     Visit Number 5    Number of Visits 16    Date for PT Re-Evaluation 10/17/21    Authorization Type [employer] MCR    PT Start Time 1028    PT Stop Time 1113    PT Time Calculation (min) 45 min    Activity Tolerance Patient tolerated treatment well    Behavior During Therapy WFL for tasks assessed/performed             Past Medical History:  Diagnosis Date   Arthritis    osteoarthritis   Asthma    Blind left eye    Cancer ([place])    skin   Crohn's disease ([place])    CTS (carpal tunnel syndrome)    DJD (degenerative joint disease)    Both cervical spine and LS spine   Fibromyalgia    GERD (gastroesophageal reflux disease)    Heart murmur    Herniated nucleus pulposus    Hyperlipidemia    Hypertension    IBS (irritable bowel syndrome)    Leukocytosis    Myasthenia gravis    Myasthenia gravis ([place])    Myasthenia gravis ([place])    Pneumonia    Spinal cord stimulator status    Past Surgical History:  Procedure Laterality Date   BACK SURGERY     BIOPSY  05/24/2020   Procedure: BIOPSY;  Surgeon: Ronald Lobo, MD;  Location: [facility];  Service: Endoscopy;;   BREAST REDUCTION SURGERY     CARPAL TUNNEL RELEASE     bilateral   cervical disc inj     CHOLECYSTECTOMY     COLONOSCOPY WITH PROPOFOL N/A 05/24/2020   Procedure:  COLONOSCOPY WITH PROPOFOL;  Surgeon: Ronald Lobo, MD;  Location: [facility];  Service: Endoscopy;  Laterality: N/A;   ENDOMETRIAL ABLATION W/ NOVASURE     ESOPHAGOGASTRODUODENOSCOPY (EGD) WITH PROPOFOL N/A 05/24/2020   Procedure: ESOPHAGOGASTRODUODENOSCOPY (EGD) WITH PROPOFOL;  Surgeon: Ronald Lobo, MD;  Location: [facility];  Service: Endoscopy;  Laterality: N/A;   LUMBAR LAMINECTOMY/DECOMPRESSION MICRODISCECTOMY Right 05/04/2020   Procedure: Right Lumbar Five Sacral One Microdiscectomy;  Surgeon: Erline Levine, MD;  Location: [facility];  Service: Neurosurgery;  Laterality: Right;  posterior   MELANOMA EXCISION     NASAL SINUS SURGERY     SPINAL CORD STIMULATOR INSERTION  2019   THYMECTOMY     due to myastenia gravis   THYMECTOMY     TRANSFORAMINAL LUMBAR INTERBODY FUSION (TLIF) WITH PEDICLE SCREW FIXATION 1 LEVEL Right 05/04/2020   Procedure: Right Lumbar Four-Five Transforaminal lumbar interbody fusion;  Surgeon: Erline Levine, MD;  Location: [facility];  Service: Neurosurgery;  Laterality: Right;  posterior   Patient Active Problem List   Diagnosis Date Noted   SIRS (systemic inflammatory response syndrome) ([place]) 06/03/2020   ARF (acute renal failure) ([place]) 05/22/2020   Diarrhea 05/22/2020   Nausea 05/22/2020  Spondylolisthesis of lumbar region 05/04/2020   Chronic pain disorder 08/12/2019   COVID-19    Acute on chronic renal failure ([place]) 02/16/2019   Insomnia disorder 02/27/2018   Fatigue 02/27/2018   Fibromyalgia syndrome 04/30/2017   RLS (restless legs syndrome) 12/26/2016   B12 deficiency 12/26/2016   Vitamin D deficiency, unspecified 12/26/2016   GERD (gastroesophageal reflux disease) 12/26/2016   Anxiety disorder 12/26/2016   Hyponatremia 10/24/2016   AKI (acute kidney injury) ([place]) 10/23/2016   Hypomagnesemia 10/22/2016   Hypophosphatemia 10/22/2016   Hypokalemia 10/21/2016   Wound infection after surgery 10/21/2016   Sepsis () 10/21/2016   Myasthenia gravis  ([place]) 10/21/2016   Melanoma ([place]) 10/21/2016   Crohn disease ([place]) 10/21/2016   Cellulitis 10/21/2016   Palpitations 08/25/2013   Leukocytosis 05/25/2011   Hyperlipidemia 04/20/2007   Essential hypertension 04/20/2007   Asthma 04/20/2007   CARPAL TUNNEL SYNDROME, HX OF 04/20/2007    REFERRING DIAG: M54.16 (ICD-10-CM) - Radiculopathy, lumbar region  THERAPY DIAG:  Other low back pain  Difficulty walking  Muscle weakness (generalized)  Chronic bilateral low back pain, unspecified whether sciatica present  Rationale for Evaluation and Treatment Rehabilitation    PRECAUTIONS:  None   SUBJECTIVE:  Pt reports fatigue after each session but improving, "not as tired".  PERTINENT HISTORY:    Lumbar fusion L4-5, septo-optic dysplasia, Crohns disease and IBS, fibromyalgia, B-12 deficiency, chronic insomnia related to maladaptive sleep behaviors, chronic leukocytosis, chronic lumbago s/p spinal cord stimulator placement in March 2019, seropositive, generalized myasthenia gravis    PAIN:  Are you having pain? yes Current 6/10 Pain location: Rt lower back  Pain description: aching, pulling, dull, deep Aggravating factors: sitting too long, standing too long, transfer, bending Relieving factors: exercise, heating pad,      PRECAUTIONS: None   WEIGHT BEARING RESTRICTIONS No   FALLS:  Has patient fallen in last 6 months? No   LIVING ENVIRONMENT: Lives with: lives with their family and lives with their spouse Lives in: House/apartment Stairs: Yes but does not use them Has following equipment at home: Single point cane   OCCUPATION: N/A   PLOF: Independent with basic ADLs   PATIENT GOALS :      OBJECTIVE:    DIAGNOSTIC FINDINGS:      PATIENT SURVEYS:  FOTO 35 9pts MCII 41 pts DC   SCREENING FOR RED FLAGS: Bowel or bladder incontinence: No Spinal tumors: No Cauda equina syndrome: No Compression fracture: No   COGNITION:           Overall cognitive status:  Within functional limits for tasks assessed                          SENSATION: Light touch: Impaired                        Reflexes L3 and S1 2+ bilat   POSTURE:  L SB in standing   PALPATION: Bilat L/S hypertonicity in paraspinals in QL with TTP into R QL and glutes   LUMBAR ROM:    Active  A/PROM  07/19/2021  Flexion 75%  Extension 50%  Right lateral flexion 60%  Left lateral flexion 60%  Right rotation 75%  Left rotation 75%   (Blank rows = not tested)   LE ROM: moderately limited bilat all planes due to hip stiffness and body habitus;   Knee and ankle ROM WFL for tasks assessed   LE MMT:  MMT Right 07/19/2021 Left 07/19/2021  Hip flexion 4/5 4/5  Hip extension 4/5 4/5  Hip abduction 4/5 4/5  Knee flexion 4/5 4/5  Knee extension 4/5 4/5  Ankle dorsiflexion 4+/5 4+/5   (Blank rows = not tested)   LUMBAR SPECIAL TESTS:  Straight leg raise test: Positive, Slump test: Negative, and Trendelenburg sign: Positive SIJ compression and distraction: positive   FUNCTIONAL TESTS:  5 times sit to stand: unable to perform without UE assist Step up/step down: unable to perform due to pain following period of sitting for subjective testing   GAIT: Distance walked: 17f Assistive device utilized: None Level of assistance: Complete Independence Comments: antalgic, R LE circumduction, bilat Trendelenburg       TODAY'S TREATMENT    Pt seen for aquatic therapy today.  Treatment took place in water 3.25-4.8 ft [facility] Temp of water was 91.  Pt entered/exited the pool via stairs step through pattern independently with bilat rail.   -without support: Walking forward, backward and side stepping Stretching hamstrings, gastroc and LB holding to hand rails 3 x 30s hold ea -Holding wall:  squats, heel/toe raises; hip abdct/ add  - Holding yellow Noodle:  hip abdct/knee flexion;  hip/knee flexion crossing midline;  hip abd/add - holding rainbow  hand buoys under water: forward/ backward walk -Seated on bench with blue step under feet: STS x 10; abduct set using ball x10; STS with adbuct set x 10. Cues for immediate standing balance; blue noodle pull down to lap x 5; repeated in standing x 10  - yellow noodle undeer arms cycling; add/abd; skii 2 sets each x 20 - supported back float (yellow noodle under arms/ blue noodle under knees, nekdoodle) to decompress spine, 2 min  Pt requires buoyancy for support and to offload joints with strengthening exercises. Viscosity of the water is needed for resistance of strengthening; water current perturbations provides challenge to standing balance unsupported, requiring increased core activation.   PATIENT EDUCATION:  Education details: pain neuro-science, chronic pain management, walking program, increasing NEAT, diagnosis, prognosis, anatomy, exercise progression, DOMS expectations, muscle firing,  envelope of function, HEP, POC   Person educated: Patient Education method: Explanation, Demonstration, Tactile cues, Verbal cues, and Handouts Education comprehension: verbalized understanding, returned demonstration, verbal cues required, and tactile cues required     HOME EXERCISE PROGRAM: Access Code: [email or web address] URL: [email or web address] Date: 07/19/2021 Prepared by: ADaleen Bo  ASSESSMENT:   CLINICAL IMPRESSION: Pt continues with improved balance initially submerged no LOB or hesitation. Advanced strengthening of LE and core using buoyancy ball. Moderate cues for immediate standing balance with STS challenge. Unable to gain balance sitting on noodle today . Fatigue improving.    Patient is a 61y.o. female who was seen today for physical therapy evaluation and treatment for cc of chronic LBP. Pt has significant PMH as listed above that are all major contributing factors to her pain. Pt also has sedentary lifestyle that likely exacerbates her pain with static positioning. Pt is  largely limited by functional mobility and general functional capacity. Pt would benefit from continued skilled therapy in order to reach goals and maximize functional lumbopelvic strength and ROM for  prevention of further functional decline.      OBJECTIVE IMPAIRMENTS Abnormal gait, decreased activity tolerance, decreased balance, decreased endurance, decreased mobility, difficulty walking, decreased ROM, decreased strength, hypomobility, increased muscle spasms, impaired flexibility, improper body mechanics, postural dysfunction, and pain.    ACTIVITY LIMITATIONS cleaning, community activity, driving,  yard work, shopping, and [employer].    PERSONAL FACTORS Age, Behavior pattern, Fitness, Past/current experiences, Time since onset of injury/illness/exacerbation, and 3+ comorbidities:  are also affecting patient's functional outcome.      REHAB POTENTIAL: Fair    CLINICAL DECISION MAKING: Evolving/moderate complexity   EVALUATION COMPLEXITY: Moderate     GOALS:     SHORT TERM GOALS: Target date: 08/30/2021   Pt will become independent with HEP in order to demonstrate synthesis of PT education.     Goal status: INITIAL   2.  Pt will report at least 2 pt reduction on NPRS scale for pain in order to demonstrate functional improvement with household activity, self care, and ADL.    Goal status: INITIAL   3.  Pt will score at least 9 pt increase on FOTO to demonstrate functional improvement in MCII and pt perceived function.     Goal status: INITIAL     LONG TERM GOALS: Target date: 10/11/2021   Pt  will become independent with final HEP in order to demonstrate synthesis of PT education.     Goal status: INITIAL   2.  Pt will be able to perform 5XSTS in under 12s  in order to demonstrate functional improvement above the cut off score for adults.    Goal status: INITIAL   3.  Pt will be able to demonstrate/report ability to walk >20 mins without pain in order to  demonstrate functional improvement and tolerance to exercise and community mobility.    Goal status: INITIAL   4.  Pt will score >/= 41 on FOTO to demonstrate improvement in perceived lumbopelvic function.    Goal status: INITIAL   5.  Pt will be able to demonstrate/report ability to sit/stand/sleep for extended periods of time without pain in order to demonstrate functional improvement and tolerance to static positioning.    Goal status: INITIAL   PLAN: PT FREQUENCY: 1-2x/week   PT DURATION: 12 weeks   PLANNED INTERVENTIONS: Therapeutic exercises, Therapeutic activity, Neuromuscular re-education, Balance training, Gait training, Patient/Family education, Joint manipulation, Joint mobilization, Stair training, Orthotic/Fit training, DME instructions, Aquatic Therapy, Dry Needling, Electrical stimulation, Spinal manipulation, Spinal mobilization, Cryotherapy, Moist heat, scar mobilization, Splintting, Taping, Vasopneumatic device, Traction, Ultrasound, Ionotophoresis 34m/ml Dexamethasone, and Manual therapy.   PLAN FOR NEXT SESSION:  general lumbar/hip stretching and strengthening, pt does have MG- may need to use cold pool   MLyons(Tharon Aquas Virgie Chery MPT 09/05/21 10:31 AM  Addended 12/08/21 MAnnamarie Major [email or web address]

## 2021-09-08 ENCOUNTER — Ambulatory Visit (HOSPITAL_BASED_OUTPATIENT_CLINIC_OR_DEPARTMENT_OTHER): Payer: Medicare HMO | Admitting: Physical Therapy

## 2021-09-13 ENCOUNTER — Ambulatory Visit (HOSPITAL_BASED_OUTPATIENT_CLINIC_OR_DEPARTMENT_OTHER): Payer: Medicare HMO | Admitting: Physical Therapy

## 2021-09-15 ENCOUNTER — Ambulatory Visit (HOSPITAL_BASED_OUTPATIENT_CLINIC_OR_DEPARTMENT_OTHER): Payer: Medicare HMO | Admitting: Physical Therapy

## 2021-09-16 ENCOUNTER — Other Ambulatory Visit: Payer: Self-pay

## 2021-09-16 ENCOUNTER — Inpatient Hospital Stay (HOSPITAL_BASED_OUTPATIENT_CLINIC_OR_DEPARTMENT_OTHER)
Admission: EM | Admit: 2021-09-16 | Discharge: 2021-09-19 | DRG: 683 | Disposition: A | Discharge status: Home / Self Care | Payer: Medicare HMO | Attending: Internal Medicine | Admitting: Internal Medicine

## 2021-09-16 ENCOUNTER — Emergency Department (HOSPITAL_BASED_OUTPATIENT_CLINIC_OR_DEPARTMENT_OTHER): Payer: Medicare HMO

## 2021-09-16 ENCOUNTER — Encounter (HOSPITAL_BASED_OUTPATIENT_CLINIC_OR_DEPARTMENT_OTHER): Payer: Self-pay | Admitting: *Deleted

## 2021-09-16 DIAGNOSIS — G7000 Myasthenia gravis without (acute) exacerbation: Secondary | ICD-10-CM | POA: Diagnosis not present

## 2021-09-16 DIAGNOSIS — R112 Nausea with vomiting, unspecified: Secondary | ICD-10-CM

## 2021-09-16 DIAGNOSIS — E7800 Pure hypercholesterolemia, unspecified: Secondary | ICD-10-CM | POA: Diagnosis not present

## 2021-09-16 DIAGNOSIS — E860 Dehydration: Secondary | ICD-10-CM | POA: Diagnosis not present

## 2021-09-16 DIAGNOSIS — K529 Noninfective gastroenteritis and colitis, unspecified: Secondary | ICD-10-CM

## 2021-09-16 DIAGNOSIS — E8721 Acute metabolic acidosis: Secondary | ICD-10-CM | POA: Diagnosis not present

## 2021-09-16 DIAGNOSIS — D72829 Elevated white blood cell count, unspecified: Secondary | ICD-10-CM | POA: Diagnosis not present

## 2021-09-16 DIAGNOSIS — M797 Fibromyalgia: Secondary | ICD-10-CM | POA: Diagnosis present

## 2021-09-16 DIAGNOSIS — N179 Acute kidney failure, unspecified: Principal | ICD-10-CM | POA: Diagnosis present

## 2021-09-16 DIAGNOSIS — I129 Hypertensive chronic kidney disease with stage 1 through stage 4 chronic kidney disease, or unspecified chronic kidney disease: Secondary | ICD-10-CM | POA: Diagnosis present

## 2021-09-16 DIAGNOSIS — N189 Chronic kidney disease, unspecified: Secondary | ICD-10-CM | POA: Diagnosis not present

## 2021-09-16 DIAGNOSIS — K5090 Crohn's disease, unspecified, without complications: Secondary | ICD-10-CM | POA: Diagnosis present

## 2021-09-16 DIAGNOSIS — Z7952 Long term (current) use of systemic steroids: Secondary | ICD-10-CM | POA: Diagnosis not present

## 2021-09-16 DIAGNOSIS — E876 Hypokalemia: Secondary | ICD-10-CM | POA: Diagnosis not present

## 2021-09-16 DIAGNOSIS — N183 Chronic kidney disease, stage 3 unspecified: Secondary | ICD-10-CM | POA: Diagnosis not present

## 2021-09-16 DIAGNOSIS — K219 Gastro-esophageal reflux disease without esophagitis: Secondary | ICD-10-CM

## 2021-09-16 LAB — CBC WITH DIFFERENTIAL/PLATELET
Abs Immature Granulocytes: 0.79 10*3/uL — ABNORMAL HIGH (ref 0.00–0.07)
Basophils Absolute: 0.2 10*3/uL — ABNORMAL HIGH (ref 0.0–0.1)
Basophils Relative: 1 %
Eosinophils Absolute: 0.3 10*3/uL (ref 0.0–0.5)
Eosinophils Relative: 1 %
HCT: 41.7 % (ref 36.0–46.0)
Hemoglobin: 14.4 g/dL (ref 12.0–15.0)
Immature Granulocytes: 4 %
Lymphocytes Relative: 18 %
Lymphs Abs: 3.8 10*3/uL (ref 0.7–4.0)
MCH: 31.7 pg (ref 26.0–34.0)
MCHC: 34.5 g/dL (ref 30.0–36.0)
MCV: 91.9 fL (ref 80.0–100.0)
Monocytes Absolute: 1.5 10*3/uL — ABNORMAL HIGH (ref 0.1–1.0)
Monocytes Relative: 7 %
Neutro Abs: 14.2 10*3/uL — ABNORMAL HIGH (ref 1.7–7.7)
Neutrophils Relative %: 69 %
Platelets: 248 10*3/uL (ref 150–400)
RBC: 4.54 MIL/uL (ref 3.87–5.11)
RDW: 13.9 % (ref 11.5–15.5)
WBC: 20.7 10*3/uL — ABNORMAL HIGH (ref 4.0–10.5)
nRBC: 0 % (ref 0.0–0.2)

## 2021-09-16 LAB — COMPREHENSIVE METABOLIC PANEL
ALT: 20 U/L (ref 0–44)
AST: 44 U/L — ABNORMAL HIGH (ref 15–41)
Albumin: 3.9 g/dL (ref 3.5–5.0)
Alkaline Phosphatase: 47 U/L (ref 38–126)
Anion gap: 12 (ref 5–15)
BUN: 29 mg/dL — ABNORMAL HIGH (ref 6–20)
CO2: 20 mmol/L — ABNORMAL LOW (ref 22–32)
Calcium: 9.6 mg/dL (ref 8.9–10.3)
Chloride: 106 mmol/L (ref 98–111)
Creatinine, Ser: 2.04 mg/dL — ABNORMAL HIGH (ref 0.44–1.00)
GFR, Estimated: 28 mL/min — ABNORMAL LOW (ref >60)
Glucose, Bld: 129 mg/dL — ABNORMAL HIGH (ref 70–99)
Potassium: 3.1 mmol/L — ABNORMAL LOW (ref 3.5–5.1)
Sodium: 138 mmol/L (ref 135–145)
Total Bilirubin: 0.8 mg/dL (ref 0.3–1.2)
Total Protein: 7.8 g/dL (ref 6.5–8.1)

## 2021-09-16 LAB — URINALYSIS, ROUTINE W REFLEX MICROSCOPIC
Bilirubin Urine: NEGATIVE
Glucose, UA: NEGATIVE mg/dL
Ketones, ur: NEGATIVE mg/dL
Leukocytes,Ua: NEGATIVE
Nitrite: NEGATIVE
Protein, ur: 100 mg/dL — AB
Specific Gravity, Urine: 1.025 (ref 1.005–1.030)
pH: 5 (ref 5.0–8.0)

## 2021-09-16 LAB — LIPASE, BLOOD: Lipase: 96 U/L — ABNORMAL HIGH (ref 11–51)

## 2021-09-16 LAB — URINALYSIS, MICROSCOPIC (REFLEX): WBC, UA: NONE SEEN WBC/hpf (ref 0–5)

## 2021-09-16 LAB — MAGNESIUM: Magnesium: 1.6 mg/dL — ABNORMAL LOW (ref 1.7–2.4)

## 2021-09-16 MED ORDER — ALBUTEROL SULFATE (2.5 MG/3ML) 0.083% IN NEBU: 3.0000 mL | INHALATION_SOLUTION | Freq: Four times a day (QID) | RESPIRATORY_TRACT | Status: DC | PRN

## 2021-09-16 MED ORDER — ROPINIROLE HCL 1 MG PO TABS: 1.0000 mg | ORAL_TABLET | Freq: Once | ORAL | Status: DC

## 2021-09-16 MED ORDER — ENOXAPARIN SODIUM 30 MG/0.3ML IJ SOSY
30.0000 mg | PREFILLED_SYRINGE | INTRAMUSCULAR | Status: DC
  Administered 2021-09-16 – 2021-09-17 (×2): 30 mg via SUBCUTANEOUS
  Filled 2021-09-16 (×2): qty 0.3

## 2021-09-16 MED ORDER — CYANOCOBALAMIN 1000 MCG/ML IJ SOLN
1000.0000 ug | INTRAMUSCULAR | Status: DC
Start: 2021-09-16 — End: 2021-09-17

## 2021-09-16 MED ORDER — PROCHLORPERAZINE EDISYLATE 10 MG/2ML IJ SOLN
10.0000 mg | Freq: Once | INTRAMUSCULAR | Status: AC
  Administered 2021-09-16: 10 mg via INTRAVENOUS
  Filled 2021-09-16: qty 2

## 2021-09-16 MED ORDER — POTASSIUM CHLORIDE 10 MEQ/100ML IV SOLN
10.0000 meq | Freq: Once | INTRAVENOUS | Status: AC
  Administered 2021-09-16: 10 meq via INTRAVENOUS
  Filled 2021-09-16: qty 100

## 2021-09-16 MED ORDER — MELATONIN 5 MG PO TABS
5.0000 mg | ORAL_TABLET | Freq: Every evening | ORAL | Status: DC | PRN
  Administered 2021-09-17 – 2021-09-19 (×2): 5 mg via ORAL
  Filled 2021-09-16 (×3): qty 1

## 2021-09-16 MED ORDER — MAGNESIUM SULFATE 2 GM/50ML IV SOLN
2.0000 g | Freq: Once | INTRAVENOUS | Status: AC
Start: 2021-09-16 — End: 2021-09-17
  Administered 2021-09-16: 2 g via INTRAVENOUS
  Filled 2021-09-16: qty 50

## 2021-09-16 MED ORDER — MAGNESIUM SULFATE 2 GM/50ML IV SOLN
2.0000 g | Freq: Once | INTRAVENOUS | Status: AC
  Administered 2021-09-16: 2 g via INTRAVENOUS
  Filled 2021-09-16: qty 50

## 2021-09-16 MED ORDER — PROCHLORPERAZINE EDISYLATE 10 MG/2ML IJ SOLN
10.0000 mg | Freq: Four times a day (QID) | INTRAMUSCULAR | Status: DC | PRN
Start: 2021-09-16 — End: 2021-09-19
  Administered 2021-09-16 – 2021-09-17 (×2): 10 mg via INTRAVENOUS
  Filled 2021-09-16 (×2): qty 2

## 2021-09-16 MED ORDER — SODIUM CHLORIDE 0.9 % IV SOLN
Freq: Once | INTRAVENOUS | Status: AC
Start: 2021-09-16 — End: 2021-09-16

## 2021-09-16 MED ORDER — ONDANSETRON HCL 4 MG/2ML IJ SOLN
4.0000 mg | Freq: Once | INTRAMUSCULAR | Status: AC
  Administered 2021-09-16: 4 mg via INTRAVENOUS
  Filled 2021-09-16: qty 2

## 2021-09-16 MED ORDER — GABAPENTIN 300 MG PO CAPS
300.0000 mg | ORAL_CAPSULE | Freq: Once | ORAL | Status: AC
  Administered 2021-09-16: 300 mg via ORAL
  Filled 2021-09-16: qty 1

## 2021-09-16 MED ORDER — SODIUM CHLORIDE 0.9 % IV SOLN: INTRAVENOUS | Status: DC | PRN

## 2021-09-16 MED ORDER — DIPHENHYDRAMINE HCL 50 MG/ML IJ SOLN
12.5000 mg | Freq: Once | INTRAMUSCULAR | Status: AC
  Administered 2021-09-16: 12.5 mg via INTRAVENOUS
  Filled 2021-09-16: qty 1

## 2021-09-16 MED ORDER — HYDROCODONE-ACETAMINOPHEN 5-325 MG PO TABS
2.0000 | ORAL_TABLET | Freq: Four times a day (QID) | ORAL | Status: DC | PRN
  Administered 2021-09-16 – 2021-09-18 (×6): 2 via ORAL
  Filled 2021-09-16 (×6): qty 2

## 2021-09-16 MED ORDER — ACETAMINOPHEN 325 MG PO TABS: 650.0000 mg | ORAL_TABLET | Freq: Four times a day (QID) | ORAL | Status: DC | PRN

## 2021-09-16 MED ORDER — LACTATED RINGERS IV SOLN
INTRAVENOUS | Status: AC
  Filled 2021-09-16 (×4): qty 1000

## 2021-09-16 MED ORDER — OXYCODONE HCL 5 MG PO TABS: 5.0000 mg | ORAL_TABLET | Freq: Four times a day (QID) | ORAL | Status: DC | PRN

## 2021-09-16 MED ORDER — SODIUM CHLORIDE 0.9 % IV BOLUS
1000.0000 mL | Freq: Once | INTRAVENOUS | Status: AC
Start: 2021-09-16 — End: 2021-09-16
  Administered 2021-09-16: 1000 mL via INTRAVENOUS

## 2021-09-16 MED ORDER — LACTATED RINGERS IV BOLUS
1000.0000 mL | Freq: Once | INTRAVENOUS | Status: AC
  Administered 2021-09-16: 1000 mL via INTRAVENOUS

## 2021-09-16 NOTE — ED Notes (Signed)
Patient states that she cannt void at this time . Will notify me when she can

## 2021-09-16 NOTE — ED Notes (Signed)
Attempt at IV access unsuccessful after multiple attempts.  Dr Ronnald Nian made aware, to attempt access with ultrasound

## 2021-09-16 NOTE — H&P (Signed)
History and Physical  Teresa Lee MHD:[id number] DOB: [birth year] DOA: 09/16/2021  Referring physician: Accepted and admitted by hospitalist service, Dr. Doristine Bosworth, [facility]. PCP: [name], MD  Outpatient Specialists: GI, pulmonology, neurology. Patient coming from: Home through [place] [facility]  Chief Complaint: Nausea vomiting and diarrhea x5 days  HPI: Teresa Lee is a 61 y.o. female with medical history significant for chronic pain syndrome with chronic opioid use, OSA, myasthenia gravis, chron's disease, history of back surgery, cholecystectomy, essential hypertension, hyperlipidemia, GERD, chronic anxiety/depression on chronic benzodiazepine, who initially presented to [facility] ED with complaints of nausea vomiting and diarrhea x5 days.  Work-up in the ED revealed electrolyte abnormalities and AKI.  Noncontrast CT abdomen pelvis showed fatty liver, aortic atherosclerosis and no acute findings.  The patient received 1 L IV fluid bolus LR x1, 1 L IV fluid bolus NS x1, magnesium replacement, and IV antiemetics.  [facility], hospitalist service, was asked to admit.  The patient was accepted and admitted by Dr. Doristine Bosworth, [facility], to [facility] long hospital MedSurg unit as observation status.  ED Course: Tmax 98.1.  BP 147/80, pulse 78, respiratory 16, O2 saturation 100% on room air.  Lab studies remarkable for serum potassium 3.1, serum bicarb 20, glucose 129, BUN 29, creatinine 2.04, GFR 28, with baseline creatinine 1.0 and EGFR of >60.  Magnesium 1.6.  Lipase 96, AST 44.  WBC 20.7k, neutrophil count 14.2.  Monocyte count 1.5, basophil absolute 0.2.  Review of Systems: Review of systems as noted in the HPI. All other systems reviewed and are negative.   Past Medical History:  Diagnosis Date   Arthritis    osteoarthritis   Asthma    Blind left eye    Cancer ([place])    skin   Crohn's disease ([place])    CTS (carpal tunnel syndrome)    DJD (degenerative joint disease)    Both cervical spine and  LS spine   Fibromyalgia    GERD (gastroesophageal reflux disease)    Heart murmur    Herniated nucleus pulposus    Hyperlipidemia    Hypertension    IBS (irritable bowel syndrome)    Leukocytosis    Myasthenia gravis    Myasthenia gravis ([place])    Myasthenia gravis ([place])    Pneumonia    Spinal cord stimulator status    Past Surgical History:  Procedure Laterality Date   BACK SURGERY     BIOPSY  05/24/2020   Procedure: BIOPSY;  Surgeon: Ronald Lobo, MD;  Location: [place];  Service: Endoscopy;;   BREAST REDUCTION SURGERY     CARPAL TUNNEL RELEASE     bilateral   cervical disc inj     CHOLECYSTECTOMY     COLONOSCOPY WITH PROPOFOL N/A 05/24/2020   Procedure: COLONOSCOPY WITH PROPOFOL;  Surgeon: Ronald Lobo, MD;  Location: [place];  Service: Endoscopy;  Laterality: N/A;   ENDOMETRIAL ABLATION W/ NOVASURE     ESOPHAGOGASTRODUODENOSCOPY (EGD) WITH PROPOFOL N/A 05/24/2020   Procedure: ESOPHAGOGASTRODUODENOSCOPY (EGD) WITH PROPOFOL;  Surgeon: Ronald Lobo, MD;  Location: [place];  Service: Endoscopy;  Laterality: N/A;   LUMBAR LAMINECTOMY/DECOMPRESSION MICRODISCECTOMY Right 05/04/2020   Procedure: Right Lumbar Five Sacral One Microdiscectomy;  Surgeon: Erline Levine, MD;  Location: [place];  Service: Neurosurgery;  Laterality: Right;  posterior   MELANOMA EXCISION     NASAL SINUS SURGERY     SPINAL CORD STIMULATOR INSERTION  2019   THYMECTOMY     due to myastenia gravis   THYMECTOMY  TRANSFORAMINAL LUMBAR INTERBODY FUSION (TLIF) WITH PEDICLE SCREW FIXATION 1 LEVEL Right 05/04/2020   Procedure: Right Lumbar Four-Five Transforaminal lumbar interbody fusion;  Surgeon: Erline Levine, MD;  Location: [place];  Service: Neurosurgery;  Laterality: Right;  posterior    Social History:  reports that she has never smoked. She has never used smokeless tobacco. She reports current alcohol use. She reports that she does not use drugs.   Allergies  Allergen Reactions    Duloxetine Nausea Only   Latex Other (See Comments)    BLISTER   Lorazepam Other (See Comments)    Hallucinations   Orange Concentrate [Flavoring Agent] Other (See Comments)    Acid reflux   Other Hives, Nausea Only and Other (See Comments)    Some nuts cause a rash   Percocet [Oxycodone-Acetaminophen] Hives   Sulfa Antibiotics Hives   Bioflavonoids Rash    Causes burning of skin and blisters   Cefixime Other (See Comments)    Due to myasthenia gravis   Nucynta [Tapentadol] Other (See Comments)    Headache     Family History  Problem Relation Age of Onset   Asthma Daughter    Heart disease Mother    Cancer Mother    Hyperlipidemia Mother    Hypertension Mother    Diabetes Father       Prior to Admission medications   Medication Sig Start Date End Date Taking? Authorizing Provider  albuterol (PROVENTIL) (2.5 MG/3ML) 0.083% nebulizer solution Take 2.5 mg by nebulization every 6 (six) hours as needed for shortness of breath. 12/26/19   [provider]  albuterol (VENTOLIN HFA) 108 (90 Base) MCG/ACT inhaler 1 PUFF EVERY 6 HOURS AS NEEDED FOR WHEEZING Patient taking differently: Inhale 1 puff into the lungs every 6 (six) hours as needed for wheezing or shortness of breath. 09/05/19   Martinique, Betty G, MD  ALPRAZolam Duanne Moron) 0.5 MG tablet Take 0.5 mg by mouth at bedtime.    [provider]  amoxicillin-clavulanate (AUGMENTIN) 875-125 MG tablet Take 1 tablet by mouth every 12 (twelve) hours. 06/07/20   Mercy Riding, MD  Calcium Carbonate (CALCIUM 500 PO) Take 1 tablet by mouth daily.    [provider]  cholestyramine light (PREVALITE) 4 g packet Take 1 packet (4 g total) by mouth 3 (three) times daily. 06/07/20 08/06/20  Mercy Riding, MD  cyanocobalamin (,VITAMIN B-12,) 1000 MCG/ML injection Inject 1,000 mcg into the muscle every 30 (thirty) days.    [provider]  estradiol (ESTRACE) 2 MG tablet Take 2 mg by mouth at bedtime.    [provider]  HYDROcodone-acetaminophen (NORCO) 10-325 MG tablet Take 1 tablet by mouth 5 (five) times daily as needed for moderate pain. 08/25/17   [provider]  loperamide (IMODIUM) 2 MG capsule Take 2 capsules (4 mg total) by mouth as needed for diarrhea or loose stools. 05/28/20   Donne Hazel, MD  Mesalamine (ASACOL) 400 MG CPDR DR capsule Take 400 mg by mouth 2 (two) times daily. 12/10/18   [provider]  methocarbamol (ROBAXIN) 750 MG tablet Take 750 mg by mouth at bedtime. 02/07/19   [provider]  mycophenolate (CELLCEPT) 500 MG tablet Take 1,000 mg by mouth 2 (two) times daily.     [provider]  naloxone [facility]) nasal spray 4 mg/0.1 mL Place 1 spray into the nose once as needed (opioid overdose).    [provider]  pantoprazole (PROTONIX) 40 MG tablet TAKE 1 TABLET  DAILY Patient taking differently: Take 40 mg by mouth 2 (two) times daily. 10/15/19   Martinique, Betty G, MD  potassium chloride 20 MEQ TBCR Take 20 mEq by mouth daily for 5 days. 06/02/20 06/07/20  Marcello Fennel, PA-C  predniSONE (DELTASONE) 10 MG tablet Take 10 mg by mouth every morning.    [provider]  progesterone (PROMETRIUM) 100 MG capsule Take 100 mg by mouth at bedtime.    [provider]  promethazine (PHENERGAN) 25 MG tablet Take 25 mg by mouth every 6 (six) hours as needed for nausea. 05/31/20   [provider]  PROMETHEGAN 25 MG suppository Place 1 suppository (25 mg total) rectally every 8 (eight) hours as needed for nausea or vomiting. 06/07/20   Mercy Riding, MD  pyridostigmine (MESTINON) 60 MG tablet Take 30 mg by mouth daily. 12/27/17   [provider]  rOPINIRole (REQUIP) 1 MG tablet Take 1-2 tablets (1-2 mg total) by mouth at bedtime. Patient taking differently: Take 1 mg by mouth See admin instructions. Take 1 tablet (1 mg) by mouth daily at bedtime, may take a 2nd tablet (1 mg) later if still needed for restless  legs 08/08/19   Martinique, Betty G, MD  rosuvastatin (CRESTOR) 40 MG tablet Take 1 tablet (40 mg total) by mouth daily. 02/02/19   Martinique, Betty G, MD  Spacer/Aero-Holding Chambers (AEROCHAMBER PLUS) inhaler Use as instructed to use with inahaler. 08/30/18   [place], Betty G, MD  SYMBICORT 80-4.5 MCG/ACT inhaler Inhale 2 puffs into the lungs 2 (two) times daily. Patient taking differently: Inhale 2 puffs into the lungs 2 (two) times daily as needed (shortness of breath). 09/05/19   [place], Betty G, MD    Physical Exam: BP (!) 147/80 (BP Location: Left Arm)   Pulse 78   Temp 98.1 F (36.7 C) (Oral)   Resp 16   Ht 5' 1"  (1.549 m)   Wt 67 kg   SpO2 100%   BMI 27.91 kg/m   General: 61 y.o. year-old female well developed well nourished in no acute distress.  Alert and oriented x3. Cardiovascular: Regular rate and rhythm with no rubs or gallops.  No thyromegaly or JVD noted.  No lower extremity edema. 2/4 pulses in all 4 extremities. Respiratory: Clear to auscultation with no wheezes or rales. Good inspiratory effort. Abdomen: Soft nontender nondistended with normal bowel sounds x4 quadrants. Muskuloskeletal: No cyanosis, clubbing or edema noted bilaterally Neuro: CN II-XII intact, strength, sensation, reflexes Skin: No ulcerative lesions noted or rashes Psychiatry: Judgement and insight appear normal. Mood is appropriate for condition and setting          Labs on Admission:  Basic Metabolic Panel: Recent Labs  Lab 09/16/21 0826  NA 138  K 3.1*  CL 106  CO2 20*  GLUCOSE 129*  BUN 29*  CREATININE 2.04*  CALCIUM 9.6  MG 1.6*   Liver Function Tests: Recent Labs  Lab 09/16/21 0826  AST 44*  ALT 20  ALKPHOS 47  BILITOT 0.8  PROT 7.8  ALBUMIN 3.9   Recent Labs  Lab 09/16/21 0826  LIPASE 96*   No results for input(s): "AMMONIA" in the last 168 hours. CBC: Recent Labs  Lab 09/16/21 0826  WBC 20.7*  NEUTROABS 14.2*  HGB 14.4  HCT 41.7  MCV 91.9  PLT 248   Cardiac  Enzymes: No results for input(s): "CKTOTAL", "CKMB", "CKMBINDEX", "TROPONINI" in the last 168 hours.  BNP (last 3 results) No results for input(s): "BNP"  in the last 8760 hours.  ProBNP (last 3 results) No results for input(s): "PROBNP" in the last 8760 hours.  CBG: No results for input(s): "GLUCAP" in the last 168 hours.  Radiological Exams on Admission: CT ABDOMEN PELVIS WO CONTRAST  Result Date: 09/16/2021 CLINICAL DATA:  Acute abdominal pain, nausea, vomiting, diarrhea EXAM: CT ABDOMEN AND PELVIS WITHOUT CONTRAST TECHNIQUE: Multidetector CT imaging of the abdomen and pelvis was performed following the standard protocol without IV contrast. RADIATION DOSE REDUCTION: This exam was performed according to the departmental dose-optimization program which includes automated exposure control, adjustment of the mA and/or kV according to patient size and/or use of iterative reconstruction technique. COMPARISON:  06/02/2020 FINDINGS: Lower chest: No pleural or pericardial effusion. Visualized lung bases clear. Hepatobiliary: Fatty liver without focal lesion. Cholecystectomy clips. Pancreas: Unremarkable. No pancreatic ductal dilatation or surrounding inflammatory changes. Spleen: Normal in size without focal abnormality. Adrenals/Urinary Tract: Adrenal glands are unremarkable. Kidneys are normal, without renal calculi, focal lesion, or hydronephrosis. Bladder is unremarkable. Stomach/Bowel: The stomach is incompletely distended. Small bowel decompressed. Normal appendix. The colon is incompletely distended, unremarkable. Vascular/Lymphatic: Mild scattered aortoiliac calcified plaque without aneurysm. Retroaortic left renal vein, an anatomic variant. No abdominal or pelvic adenopathy. Reproductive: Uterus and bilateral adnexa are unremarkable. Other: Bilateral pelvic phleboliths.  No ascites.  No free air. Musculoskeletal: Implanted dorsal stimulator device, proximal extent of leads not visualized.  Instrumented PLIF L4-5. No acute findings. IMPRESSION: 1. No acute findings. 2.  Aortic Atherosclerosis (ICD10-170.0). 3. Fatty liver Electronically Signed   By: Lucrezia Europe M.D.   On: 09/16/2021 09:42    EKG: I independently viewed the EKG done and my findings are as followed: None available at the time of the visit.  Assessment/Plan Present on Admission:  Acute on chronic kidney failure (HCC)  Principal Problem:   Acute on chronic kidney failure (HCC)  AKI, likely prerenal in the setting of dehydration from poor oral intake, emesis and diarrhea. Baseline creatinine appears to be 1.0 with GFR greater than 60. Presented with creatinine of 2.04 with GFR 28 Avoid nephrotoxic agents, dehydration and hypotension Continue IV fluid hydration Monitor urine output with strict I's and O's Repeat renal panel in the morning.  Hypokalemia Repleted intravenously LR KCl 40 mEq at 100 cc/h x 1 day. Repeat serum potassium in the morning  Hypomagnesemia Presented with serum magnesium of 1.6 Repleted intravenously Repeat serum magnesium level in the morning  Leukocytosis, possibly demargination from steroid use Afebrile Monitor WBC  Asymptomatic pyuria, POA UA positive for pyuria Monitor for now off antibiotics    DVT prophylaxis: Subcu Lovenox daily  Code Status: Full code  Family Communication: None at bedside  Disposition Plan: Admitted to [place] unit, accepted by Dr. Doristine Bosworth, [facility] hospitalist service.  Consults called: Consult GI in the morning  Admission status: Observation status   Status is: Observation    Kayleen Memos MD [facility] Pager [phone]  If 7PM-7AM, please contact night-coverage [email or web address] Password [email or web address]  09/16/2021, 9:02 PM

## 2021-09-16 NOTE — ED Triage Notes (Signed)
States has Nausea, Vomiting and Diarrhea since this past Monday, poor PO intake.

## 2021-09-16 NOTE — ED Provider Notes (Signed)
[facility] Provider Note   CSN: [id number] Arrival date & time: 09/16/21  6945     History  Chief Complaint  Patient presents with   Emesis    Teresa Lee is a 61 y.o. female.  Is here with nausea, vomiting, diarrhea, abdominal pain for the last for 5 days.  She feels dehydrated.  History of irritable bowel syndrome, Crohn's disease, high cholesterol, hypertension, myasthenia gravis, fibromyalgia.  She denies any recent illness.  Denies any recent antibiotics.  She did start Ozempic several weeks ago which she thought might of triggered some of her symptoms.  Nothing makes it better.  She has tried Phenergan and Zofran at home.  She denies any fevers or chills.  Denies any pain with urination.  Mostly watery stool.  Nonbloody.  She is having mostly upper abdominal pain at times.  She has had her gallbladder removed in the past.  Denies any chest pain or shortness of breath.  She feels generally weak.  No headache.  The history is provided by the patient.       Home Medications Prior to Admission medications   Medication Sig Start Date End Date Taking? Authorizing Provider  albuterol (PROVENTIL) (2.5 MG/3ML) 0.083% nebulizer solution Take 2.5 mg by nebulization every 6 (six) hours as needed for shortness of breath. 12/26/19   [provider]  albuterol (VENTOLIN HFA) 108 (90 Base) MCG/ACT inhaler 1 PUFF EVERY 6 HOURS AS NEEDED FOR WHEEZING Patient taking differently: Inhale 1 puff into the lungs every 6 (six) hours as needed for wheezing or shortness of breath. 09/05/19   Martinique, Betty G, MD  ALPRAZolam Duanne Moron) 0.5 MG tablet Take 0.5 mg by mouth at bedtime.    [provider]  amoxicillin-clavulanate (AUGMENTIN) 875-125 MG tablet Take 1 tablet by mouth every 12 (twelve) hours. 06/07/20   Mercy Riding, MD  Calcium Carbonate (CALCIUM 500 PO) Take 1 tablet by mouth daily.    [provider]  cholestyramine light (PREVALITE)  4 g packet Take 1 packet (4 g total) by mouth 3 (three) times daily. 06/07/20 08/06/20  Mercy Riding, MD  cyanocobalamin (,VITAMIN B-12,) 1000 MCG/ML injection Inject 1,000 mcg into the muscle every 30 (thirty) days.    [provider]  estradiol (ESTRACE) 2 MG tablet Take 2 mg by mouth at bedtime.    [provider]  HYDROcodone-acetaminophen (NORCO) 10-325 MG tablet Take 1 tablet by mouth 5 (five) times daily as needed for moderate pain. 08/25/17   [provider]  loperamide (IMODIUM) 2 MG capsule Take 2 capsules (4 mg total) by mouth as needed for diarrhea or loose stools. 05/28/20   Donne Hazel, MD  Mesalamine (ASACOL) 400 MG CPDR DR capsule Take 400 mg by mouth 2 (two) times daily. 12/10/18   [provider]  methocarbamol (ROBAXIN) 750 MG tablet Take 750 mg by mouth at bedtime. 02/07/19   [provider]  mycophenolate (CELLCEPT) 500 MG tablet Take 1,000 mg by mouth 2 (two) times daily.     [provider]  naloxone [facility]) nasal spray 4 mg/0.1 mL Place 1 spray into the nose once as needed (opioid overdose).    [provider]  pantoprazole (PROTONIX) 40 MG tablet TAKE 1 TABLET DAILY Patient taking differently: Take 40 mg by mouth 2 (two) times daily. 10/15/19   Martinique, Betty G, MD  potassium chloride 20 MEQ TBCR Take 20 mEq by mouth daily for 5 days. 06/02/20 06/07/20  Marcello Fennel, PA-C  predniSONE (DELTASONE) 10 MG tablet Take 10 mg by mouth every morning.    [provider]  progesterone (PROMETRIUM) 100 MG capsule Take 100 mg by mouth at bedtime.    [provider]  promethazine (PHENERGAN) 25 MG tablet Take 25 mg by mouth every 6 (six) hours as needed for nausea. 05/31/20   [provider]  PROMETHEGAN 25 MG suppository Place 1 suppository (25 mg total) rectally every 8 (eight) hours as needed for nausea or vomiting. 06/07/20   Mercy Riding, MD  pyridostigmine (MESTINON) 60 MG tablet Take 30 mg  by mouth daily. 12/27/17   [provider]  rOPINIRole (REQUIP) 1 MG tablet Take 1-2 tablets (1-2 mg total) by mouth at bedtime. Patient taking differently: Take 1 mg by mouth See admin instructions. Take 1 tablet (1 mg) by mouth daily at bedtime, may take a 2nd tablet (1 mg) later if still needed for restless legs 08/08/19   Martinique, Betty G, MD  rosuvastatin (CRESTOR) 40 MG tablet Take 1 tablet (40 mg total) by mouth daily. 02/02/19   Martin[place], Betty G, MD  Spacer/Aero-Holding Chambers (AEROCHAMBER PLUS) inhaler Use as instructed to use with inahaler. 08/30/18   Martin[place], Betty G, MD  SYMBICORT 80-4.5 MCG/ACT inhaler Inhale 2 puffs into the lungs 2 (two) times daily. Patient taking differently: Inhale 2 puffs into the lungs 2 (two) times daily as needed (shortness of breath). 09/05/19   [place], Betty G, MD      Allergies    Duloxetine, Latex, Lorazepam, Orange concentrate [flavoring agent], Other, Percocet [oxycodone-acetaminophen], Sulfa antibiotics, Bioflavonoids, Cefixime, and Nucynta [tapentadol]    Review of Systems   Review of Systems  Physical Exam Updated Vital Signs BP (!) 144/92 (BP Location: Right Arm)   Pulse 94   Temp (!) 97.5 F (36.4 C)   Resp 18   Ht 5' 1"  (1.549 m)   Wt 67 kg   SpO2 100%   BMI 27.91 kg/m  Physical Exam Vitals and nursing note reviewed.  Constitutional:      General: She is not in acute distress.    Appearance: She is well-developed. She is ill-appearing.  HENT:     Head: Normocephalic and atraumatic.     Mouth/Throat:     Mouth: Mucous membranes are dry.  Eyes:     Conjunctiva/sclera: Conjunctivae normal.     Pupils: Pupils are equal, round, and reactive to light.  Cardiovascular:     Rate and Rhythm: Normal rate and regular rhythm.     Heart sounds: No murmur heard. Pulmonary:     Effort: Pulmonary effort is normal. No respiratory distress.     Breath sounds: Normal breath sounds.  Abdominal:     Palpations: Abdomen is soft.      Tenderness: There is abdominal tenderness.  Musculoskeletal:        General: No swelling.     Cervical back: Normal range of motion and neck supple.  Skin:    General: Skin is warm and dry.     Capillary Refill: Capillary refill takes less than 2 seconds.  Neurological:     General: No focal deficit present.     Mental Status: She is alert.  Psychiatric:        Mood and Affect: Mood normal.     ED Results / Procedures / Treatments   Labs (all labs ordered are listed, but only abnormal results are displayed) Labs Reviewed  CBC WITH DIFFERENTIAL/PLATELET - Abnormal;  Notable for the following components:      Result Value   WBC 20.7 (*)    Neutro Abs 14.2 (*)    Monocytes Absolute 1.5 (*)    Basophils Absolute 0.2 (*)    Abs Immature Granulocytes 0.79 (*)    All other components within normal limits  COMPREHENSIVE METABOLIC PANEL - Abnormal; Notable for the following components:   Potassium 3.1 (*)    CO2 20 (*)    Glucose, Bld 129 (*)    BUN 29 (*)    Creatinine, Ser 2.04 (*)    AST 44 (*)    GFR, Estimated 28 (*)    All other components within normal limits  LIPASE, BLOOD - Abnormal; Notable for the following components:   Lipase 96 (*)    All other components within normal limits  MAGNESIUM - Abnormal; Notable for the following components:   Magnesium 1.6 (*)    All other components within normal limits  C DIFFICILE QUICK SCREEN W PCR REFLEX    GASTROINTESTINAL PANEL BY PCR, STOOL (REPLACES STOOL CULTURE)  URINALYSIS, ROUTINE W REFLEX MICROSCOPIC    EKG None  Radiology CT ABDOMEN PELVIS WO CONTRAST  Result Date: 09/16/2021 CLINICAL DATA:  Acute abdominal pain, nausea, vomiting, diarrhea EXAM: CT ABDOMEN AND PELVIS WITHOUT CONTRAST TECHNIQUE: Multidetector CT imaging of the abdomen and pelvis was performed following the standard protocol without IV contrast. RADIATION DOSE REDUCTION: This exam was performed according to the departmental dose-optimization program  which includes automated exposure control, adjustment of the mA and/or kV according to patient size and/or use of iterative reconstruction technique. COMPARISON:  06/02/2020 FINDINGS: Lower chest: No pleural or pericardial effusion. Visualized lung bases clear. Hepatobiliary: Fatty liver without focal lesion. Cholecystectomy clips. Pancreas: Unremarkable. No pancreatic ductal dilatation or surrounding inflammatory changes. Spleen: Normal in size without focal abnormality. Adrenals/Urinary Tract: Adrenal glands are unremarkable. Kidneys are normal, without renal calculi, focal lesion, or hydronephrosis. Bladder is unremarkable. Stomach/Bowel: The stomach is incompletely distended. Small bowel decompressed. Normal appendix. The colon is incompletely distended, unremarkable. Vascular/Lymphatic: Mild scattered aortoiliac calcified plaque without aneurysm. Retroaortic left renal vein, an anatomic variant. No abdominal or pelvic adenopathy. Reproductive: Uterus and bilateral adnexa are unremarkable. Other: Bilateral pelvic phleboliths.  No ascites.  No free air. Musculoskeletal: Implanted dorsal stimulator device, proximal extent of leads not visualized. Instrumented PLIF L4-5. No acute findings. IMPRESSION: 1. No acute findings. 2.  Aortic Atherosclerosis (ICD10-170.0). 3. Fatty liver Electronically Signed   By: Lucrezia Europe M.D.   On: 09/16/2021 09:42    Procedures Procedures    Medications Ordered in ED Medications  potassium chloride 10 mEq in 100 mL IVPB (10 mEq Intravenous New Bag/Given 09/16/21 0939)  magnesium sulfate IVPB 2 g 50 mL (has no administration in time range)  0.9 %  sodium chloride infusion (has no administration in time range)  sodium chloride 0.9 % bolus 1,000 mL (0 mLs Intravenous Stopped 09/16/21 0934)  ondansetron (ZOFRAN) injection 4 mg (4 mg Intravenous Given 09/16/21 0824)  lactated ringers bolus 1,000 mL (1,000 mLs Intravenous New Bag/Given 09/16/21 0937)  prochlorperazine (COMPAZINE)  injection 10 mg (10 mg Intravenous Given 09/16/21 0947)  diphenhydrAMINE (BENADRYL) injection 12.5 mg (12.5 mg Intravenous Given 09/16/21 0947)    ED Course/ Medical Decision Making/ A&P                           Medical Decision Making Amount and/or Complexity of Data Reviewed Labs: ordered.  Radiology: ordered.  Risk Prescription drug management. Decision regarding hospitalization.   Adalina Dopson is here with nausea, vomiting, diarrhea, abdominal pain for the last 4 to 5 days.  Comorbidities of IBS, Crohn's, myasthenia gravis, fibromyalgia, diabetes, gallbladder removal in the past.  Differential diagnosis is IBS/Crohn's flare versus viral illness/foodborne illness, colitis.  Seems less likely to be bowel obstruction or pancreatitis or other acute intra-abdominal process.  She looks clinically dehydrated.  She did states she started Ozempic recently and not sure if maybe this is a side effect.  We will check CBC, CMP, lipase, urinalysis, CT scan abdomen pelvis.  Will give IV Zofran, IV fluids and reevaluate.  Per my review and interpretation of labs patient with leukocytosis of 20.  Potassium 3.1, magnesium 1.6.  Creatinine elevation to 2.04.  Her baseline is normally around 1.  Overall picture consistent with dehydration.  CT scan of the abdomen and pelvis shows no acute findings.  Overall suspect uncontrollable nausea vomiting diarrhea causing dehydration.  She still fairly symptomatic after several rounds of antiemetics and to IV fluid boluses.  Will admit for observation for further hydration and care.  I have repleted potassium and magnesium.  This chart was dictated using voice recognition software.  Despite best efforts to proofread,  errors can occur which can change the documentation meaning.         Final Clinical Impression(s) / ED Diagnoses Final diagnoses:  Nausea vomiting and diarrhea  Dehydration  AKI (acute kidney injury) ([place])  Hypokalemia  Hypomagnesemia     Rx / DC Orders ED Discharge Orders     None         Lennice Sites, DO 09/16/21 [phone]

## 2021-09-16 NOTE — ED Notes (Signed)
ED Provider at bedside. 

## 2021-09-16 NOTE — ED Notes (Signed)
Provider states that it is ok to run magnesium once Lr is complete due to compatibly.

## 2021-09-17 DIAGNOSIS — Z7952 Long term (current) use of systemic steroids: Secondary | ICD-10-CM | POA: Diagnosis not present

## 2021-09-17 DIAGNOSIS — E7800 Pure hypercholesterolemia, unspecified: Secondary | ICD-10-CM | POA: Diagnosis present

## 2021-09-17 DIAGNOSIS — K529 Noninfective gastroenteritis and colitis, unspecified: Secondary | ICD-10-CM

## 2021-09-17 DIAGNOSIS — E860 Dehydration: Secondary | ICD-10-CM | POA: Diagnosis present

## 2021-09-17 DIAGNOSIS — N179 Acute kidney failure, unspecified: Secondary | ICD-10-CM | POA: Diagnosis present

## 2021-09-17 DIAGNOSIS — I129 Hypertensive chronic kidney disease with stage 1 through stage 4 chronic kidney disease, or unspecified chronic kidney disease: Secondary | ICD-10-CM | POA: Diagnosis present

## 2021-09-17 DIAGNOSIS — E8721 Acute metabolic acidosis: Secondary | ICD-10-CM | POA: Diagnosis present

## 2021-09-17 DIAGNOSIS — G7000 Myasthenia gravis without (acute) exacerbation: Secondary | ICD-10-CM | POA: Diagnosis present

## 2021-09-17 DIAGNOSIS — N189 Chronic kidney disease, unspecified: Secondary | ICD-10-CM | POA: Diagnosis present

## 2021-09-17 DIAGNOSIS — K5000 Crohn's disease of small intestine without complications: Secondary | ICD-10-CM | POA: Diagnosis not present

## 2021-09-17 DIAGNOSIS — K5090 Crohn's disease, unspecified, without complications: Secondary | ICD-10-CM | POA: Diagnosis present

## 2021-09-17 DIAGNOSIS — M797 Fibromyalgia: Secondary | ICD-10-CM | POA: Diagnosis present

## 2021-09-17 DIAGNOSIS — D72829 Elevated white blood cell count, unspecified: Secondary | ICD-10-CM | POA: Diagnosis present

## 2021-09-17 DIAGNOSIS — E876 Hypokalemia: Secondary | ICD-10-CM | POA: Diagnosis present

## 2021-09-17 LAB — GASTROINTESTINAL PANEL BY PCR, STOOL (REPLACES STOOL CULTURE)

## 2021-09-17 LAB — CBC WITH DIFFERENTIAL/PLATELET
Abs Immature Granulocytes: 0.44 10*3/uL — ABNORMAL HIGH (ref 0.00–0.07)
Basophils Absolute: 0.1 10*3/uL (ref 0.0–0.1)
Basophils Relative: 1 %
Eosinophils Absolute: 0.2 10*3/uL (ref 0.0–0.5)
Eosinophils Relative: 2 %
HCT: 37.3 % (ref 36.0–46.0)
Hemoglobin: 12.7 g/dL (ref 12.0–15.0)
Immature Granulocytes: 4 %
Lymphocytes Relative: 13 %
Lymphs Abs: 1.5 10*3/uL (ref 0.7–4.0)
MCH: 31.9 pg (ref 26.0–34.0)
MCHC: 34 g/dL (ref 30.0–36.0)
MCV: 93.7 fL (ref 80.0–100.0)
Monocytes Absolute: 0.8 10*3/uL (ref 0.1–1.0)
Monocytes Relative: 8 %
Neutro Abs: 8.1 10*3/uL — ABNORMAL HIGH (ref 1.7–7.7)
Neutrophils Relative %: 72 %
Platelets: 156 10*3/uL (ref 150–400)
RBC: 3.98 MIL/uL (ref 3.87–5.11)
RDW: 13.5 % (ref 11.5–15.5)
WBC: 11.1 10*3/uL — ABNORMAL HIGH (ref 4.0–10.5)
nRBC: 0 % (ref 0.0–0.2)

## 2021-09-17 LAB — COMPREHENSIVE METABOLIC PANEL
ALT: 26 U/L (ref 0–44)
AST: 36 U/L (ref 15–41)
Albumin: 3.5 g/dL (ref 3.5–5.0)
Alkaline Phosphatase: 45 U/L (ref 38–126)
Anion gap: 10 (ref 5–15)
BUN: 21 mg/dL — ABNORMAL HIGH (ref 6–20)
CO2: 20 mmol/L — ABNORMAL LOW (ref 22–32)
Calcium: 8.6 mg/dL — ABNORMAL LOW (ref 8.9–10.3)
Chloride: 109 mmol/L (ref 98–111)
Creatinine, Ser: 1.56 mg/dL — ABNORMAL HIGH (ref 0.44–1.00)
GFR, Estimated: 38 mL/min — ABNORMAL LOW (ref >60)
Glucose, Bld: 106 mg/dL — ABNORMAL HIGH (ref 70–99)
Potassium: 2.8 mmol/L — ABNORMAL LOW (ref 3.5–5.1)
Sodium: 139 mmol/L (ref 135–145)
Total Bilirubin: 0.7 mg/dL (ref 0.3–1.2)
Total Protein: 6.6 g/dL (ref 6.5–8.1)

## 2021-09-17 LAB — PHOSPHORUS: Phosphorus: 3.5 mg/dL (ref 2.5–4.6)

## 2021-09-17 LAB — HIV ANTIBODY (ROUTINE TESTING W REFLEX): HIV Screen 4th Generation wRfx: NONREACTIVE

## 2021-09-17 LAB — C DIFFICILE QUICK SCREEN W PCR REFLEX
C Diff antigen: NEGATIVE
C Diff interpretation: NOT DETECTED
C Diff toxin: NEGATIVE

## 2021-09-17 LAB — MAGNESIUM: Magnesium: 2.8 mg/dL — ABNORMAL HIGH (ref 1.7–2.4)

## 2021-09-17 MED ORDER — ROPINIROLE HCL 0.25 MG PO TABS: 1.0000 mg | ORAL_TABLET | Freq: Every evening | ORAL | Status: DC | PRN

## 2021-09-17 MED ORDER — PREGABALIN 50 MG PO CAPS
50.0000 mg | ORAL_CAPSULE | Freq: Every day | ORAL | Status: DC
  Administered 2021-09-17 – 2021-09-18 (×2): 50 mg via ORAL
  Filled 2021-09-17 (×2): qty 1

## 2021-09-17 MED ORDER — POTASSIUM CHLORIDE CRYS ER 20 MEQ PO TBCR
40.0000 meq | EXTENDED_RELEASE_TABLET | ORAL | Status: AC
  Administered 2021-09-17 (×2): 40 meq via ORAL
  Filled 2021-09-17 (×2): qty 2

## 2021-09-17 MED ORDER — ROPINIROLE HCL 0.25 MG PO TABS
1.0000 mg | ORAL_TABLET | Freq: Every day | ORAL | Status: DC
  Administered 2021-09-17 – 2021-09-18 (×2): 1 mg via ORAL
  Filled 2021-09-17 (×2): qty 4

## 2021-09-17 MED ORDER — PREDNISONE 10 MG PO TABS
10.0000 mg | ORAL_TABLET | Freq: Every morning | ORAL | Status: DC
  Administered 2021-09-17 – 2021-09-19 (×3): 10 mg via ORAL
  Filled 2021-09-17 (×3): qty 1

## 2021-09-17 MED ORDER — ROSUVASTATIN CALCIUM 10 MG PO TABS
40.0000 mg | ORAL_TABLET | Freq: Every day | ORAL | Status: DC
  Administered 2021-09-17 – 2021-09-19 (×3): 40 mg via ORAL
  Filled 2021-09-17 (×3): qty 4

## 2021-09-17 MED ORDER — PANTOPRAZOLE SODIUM 40 MG PO TBEC
40.0000 mg | DELAYED_RELEASE_TABLET | Freq: Two times a day (BID) | ORAL | Status: DC
  Administered 2021-09-17 – 2021-09-19 (×5): 40 mg via ORAL
  Filled 2021-09-17 (×5): qty 1

## 2021-09-17 MED ORDER — ROPINIROLE HCL 0.25 MG PO TABS: 1.0000 mg | ORAL_TABLET | Freq: Every day | ORAL | Status: DC

## 2021-09-17 MED ORDER — MYCOPHENOLATE MOFETIL 250 MG PO CAPS
1000.0000 mg | ORAL_CAPSULE | Freq: Two times a day (BID) | ORAL | Status: DC
  Administered 2021-09-17 – 2021-09-19 (×5): 1000 mg via ORAL
  Filled 2021-09-17 (×5): qty 4

## 2021-09-17 MED ORDER — SODIUM CHLORIDE 0.9 % IV SOLN
INTRAVENOUS | Status: DC
Start: 2021-09-17 — End: 2021-09-17

## 2021-09-17 MED ORDER — MYCOPHENOLATE MOFETIL 500 MG PO TABS: 1000.0000 mg | ORAL_TABLET | Freq: Two times a day (BID) | ORAL | Status: DC

## 2021-09-17 NOTE — Progress Notes (Signed)
PROGRESS NOTE    Teresa Lee  AVW:[id number] DOB: 03/10/1961 DOA: 09/16/2021 PCP: [name], MD   Brief Narrative:  61 y.o. female with medical history significant for chronic pain syndrome with chronic opioid use, OSA, myasthenia gravis on chronic prednisone, chron's disease, history of back surgery, cholecystectomy, essential hypertension, hyperlipidemia, GERD, chronic anxiety/depression on chronic benzodiazepine presented with nausea, vomiting and diarrhea.  On presentation, potassium was 3.1, bicarb was 20, creatinine of 2.04 and magnesium of 1.6 with WBC of 20.7.  CT of the abdomen and pelvis showed no acute findings.  She was started on IV fluids.  Assessment & Plan:   Acute kidney injury Acute metabolic acidosis -Possibly prerenal from nausea, vomiting and diarrhea. -Treated with IV fluids.  Creatinine improving from 2.04 to 1.56 today.  CT of the abdomen did not show any renal abnormalities. -Continue IV fluids.  Repeat a.m. labs.  Possible acute gastroenteritis presenting with nausea, vomiting, diarrhea -Still having diarrhea and nausea.  Stool for C. difficile is negative.  Stool for GI PCR pending. -Wants to try full liquid diet today.  Hypokalemia -Replace.  Repeat a.m. labs  Hypomagnesemia -Resolved  Leukocytosis -Improving  Asymptomatic bacteriuria -Monitor off antibiotics  Myasthenia gravis with chronic prednisone use -No acute issues.  Continue home regimen   Crohn's disease -Continue home regimen.  Outpatient follow-up with GI.   DVT prophylaxis: Lovenox Code Status: Full Family Communication: None at bedside Disposition Plan: Status is: Observation The patient will require care spanning > 2 midnights and should be moved to inpatient because: Of severity of illness.  Need for IV fluids.    Consultants: None  Procedures: None  Antimicrobials: None   Subjective: Patient seen and examined at bedside.  Still complains of diarrhea  and nausea.  Not ready to go home yet.  Wants to try full liquid diet.  Denies any fever, worsening shortness of breath.  Objective: Vitals:   09/16/21 2052 09/17/21 0059 09/17/21 0514 09/17/21 0838  BP: (!) 147/80 112/69 109/65 127/82  Pulse: 78 89 82 96  Resp: 16 17 17 16   Temp: 98.1 F (36.7 C) 98.5 F (36.9 C) 99 F (37.2 C) 98.3 F (36.8 C)  TempSrc: Oral   Oral  SpO2: 100% 95% 98% 99%  Weight:      Height:        Intake/Output Summary (Last 24 hours) at 09/17/2021 1130 Last data filed at 09/17/2021 0600 Gross per 24 hour  Intake 1057.46 ml  Output 600 ml  Net 457.46 ml   Filed Weights   09/16/21 0753  Weight: 67 kg    Examination:  General exam: Appears calm and comfortable.  Currently on room air.  Looks chronically ill and deconditioned. Respiratory system: Bilateral decreased breath sounds at bases Cardiovascular system: S1 & S2 heard, Rate controlled Gastrointestinal system: Abdomen is nondistended, soft and mildly tender in the lower quadrant.  Normal bowel sounds heard. Extremities: No cyanosis, clubbing, edema  Central nervous system: Alert and oriented. No focal neurological deficits. Moving extremities Skin: No rashes, lesions or ulcers Psychiatry: Affect is flat.  No signs of agitation.   Data Reviewed: I have personally reviewed following labs and imaging studies  CBC: Recent Labs  Lab 09/16/21 0826 09/17/21 0621  WBC 20.7* 11.1*  NEUTROABS 14.2* 8.1*  HGB 14.4 12.7  HCT 41.7 37.3  MCV 91.9 93.7  PLT 248 829   Basic Metabolic Panel: Recent Labs  Lab 09/16/21 0826 09/17/21 0621  NA 138 139  K  3.1* 2.8*  CL 106 109  CO2 20* 20*  GLUCOSE 129* 106*  BUN 29* 21*  CREATININE 2.04* 1.56*  CALCIUM 9.6 8.6*  MG 1.6* 2.8*  PHOS  --  3.5   GFR: Estimated Creatinine Clearance: 34 mL/min (A) (by C-G formula based on SCr of 1.56 mg/dL (H)). Liver Function Tests: Recent Labs  Lab 09/16/21 0826 09/17/21 0621  AST 44* 36  ALT 20 26   ALKPHOS 47 45  BILITOT 0.8 0.7  PROT 7.8 6.6  ALBUMIN 3.9 3.5   Recent Labs  Lab 09/16/21 0826  LIPASE 96*   No results for input(s): "AMMONIA" in the last 168 hours. Coagulation Profile: No results for input(s): "INR", "PROTIME" in the last 168 hours. Cardiac Enzymes: No results for input(s): "CKTOTAL", "CKMB", "CKMBINDEX", "TROPONINI" in the last 168 hours. BNP (last 3 results) No results for input(s): "PROBNP" in the last 8760 hours. HbA1C: No results for input(s): "HGBA1C" in the last 72 hours. CBG: No results for input(s): "GLUCAP" in the last 168 hours. Lipid Profile: No results for input(s): "CHOL", "HDL", "LDLCALC", "TRIG", "CHOLHDL", "LDLDIRECT" in the last 72 hours. Thyroid Function Tests: No results for input(s): "TSH", "T4TOTAL", "FREET4", "T3FREE", "THYROIDAB" in the last 72 hours. Anemia Panel: No results for input(s): "VITAMINB12", "FOLATE", "FERRITIN", "TIBC", "IRON", "RETICCTPCT" in the last 72 hours. Sepsis Labs: No results for input(s): "PROCALCITON", "LATICACIDVEN" in the last 168 hours.  Recent Results (from the past 240 hour(s))  C Difficile Quick Screen w PCR reflex     Status: None   Collection Time: 09/17/21  1:34 AM   Specimen: STOOL  Result Value Ref Range Status   C Diff antigen NEGATIVE NEGATIVE Final   C Diff toxin NEGATIVE NEGATIVE Final   C Diff interpretation No C. difficile detected.  Final    Comment: Performed at [facility], [place] [address] [place], [place] [address]         Radiology Studies: CT ABDOMEN PELVIS WO CONTRAST  Result Date: 09/16/2021 CLINICAL DATA:  Acute abdominal pain, nausea, vomiting, diarrhea EXAM: CT ABDOMEN AND PELVIS WITHOUT CONTRAST TECHNIQUE: Multidetector CT imaging of the abdomen and pelvis was performed following the standard protocol without IV contrast. RADIATION DOSE REDUCTION: This exam was performed according to the departmental dose-optimization program which includes automated  exposure control, adjustment of the mA and/or kV according to patient size and/or use of iterative reconstruction technique. COMPARISON:  06/02/2020 FINDINGS: Lower chest: No pleural or pericardial effusion. Visualized lung bases clear. Hepatobiliary: Fatty liver without focal lesion. Cholecystectomy clips. Pancreas: Unremarkable. No pancreatic ductal dilatation or surrounding inflammatory changes. Spleen: Normal in size without focal abnormality. Adrenals/Urinary Tract: Adrenal glands are unremarkable. Kidneys are normal, without renal calculi, focal lesion, or hydronephrosis. Bladder is unremarkable. Stomach/Bowel: The stomach is incompletely distended. Small bowel decompressed. Normal appendix. The colon is incompletely distended, unremarkable. Vascular/Lymphatic: Mild scattered aortoiliac calcified plaque without aneurysm. Retroaortic left renal vein, an anatomic variant. No abdominal or pelvic adenopathy. Reproductive: Uterus and bilateral adnexa are unremarkable. Other: Bilateral pelvic phleboliths.  No ascites.  No free air. Musculoskeletal: Implanted dorsal stimulator device, proximal extent of leads not visualized. Instrumented PLIF L4-5. No acute findings. IMPRESSION: 1. No acute findings. 2.  Aortic Atherosclerosis (ICD10-170.0). 3. Fatty liver Electronically Signed   By: Lucrezia Europe M.D.   On: 09/16/2021 09:42        Scheduled Meds:  enoxaparin (LOVENOX) injection  30 mg Subcutaneous Q24H   mycophenolate  1,000 mg Oral BID  pantoprazole  40 mg Oral BID   potassium chloride  40 mEq Oral Q4H   predniSONE  10 mg Oral q morning   rOPINIRole  1 mg Oral QHS   rosuvastatin  40 mg Oral Daily   Continuous Infusions:  sodium chloride Stopped (09/17/21 0101)   lactated ringers 1,000 mL with potassium chloride 40 mEq infusion 100 mL/hr at 09/17/21 1021          Skipper Dacosta, MD [facility] 09/17/2021, 11:30 AM

## 2021-09-18 DIAGNOSIS — D72829 Elevated white blood cell count, unspecified: Secondary | ICD-10-CM

## 2021-09-18 DIAGNOSIS — E876 Hypokalemia: Secondary | ICD-10-CM | POA: Diagnosis not present

## 2021-09-18 DIAGNOSIS — N179 Acute kidney failure, unspecified: Secondary | ICD-10-CM | POA: Diagnosis not present

## 2021-09-18 DIAGNOSIS — K529 Noninfective gastroenteritis and colitis, unspecified: Secondary | ICD-10-CM

## 2021-09-18 DIAGNOSIS — K5000 Crohn's disease of small intestine without complications: Secondary | ICD-10-CM | POA: Diagnosis not present

## 2021-09-18 DIAGNOSIS — G7000 Myasthenia gravis without (acute) exacerbation: Secondary | ICD-10-CM

## 2021-09-18 LAB — CBC WITH DIFFERENTIAL/PLATELET
Abs Immature Granulocytes: 0.45 10*3/uL — ABNORMAL HIGH (ref 0.00–0.07)
Basophils Absolute: 0.1 10*3/uL (ref 0.0–0.1)
Basophils Relative: 1 %
Eosinophils Absolute: 0.2 10*3/uL (ref 0.0–0.5)
Eosinophils Relative: 2 %
HCT: 32.1 % — ABNORMAL LOW (ref 36.0–46.0)
Hemoglobin: 10.9 g/dL — ABNORMAL LOW (ref 12.0–15.0)
Immature Granulocytes: 4 %
Lymphocytes Relative: 20 %
Lymphs Abs: 2.2 10*3/uL (ref 0.7–4.0)
MCH: 31.5 pg (ref 26.0–34.0)
MCHC: 34 g/dL (ref 30.0–36.0)
MCV: 92.8 fL (ref 80.0–100.0)
Monocytes Absolute: 1.1 10*3/uL — ABNORMAL HIGH (ref 0.1–1.0)
Monocytes Relative: 10 %
Neutro Abs: 6.9 10*3/uL (ref 1.7–7.7)
Neutrophils Relative %: 63 %
Platelets: 164 10*3/uL (ref 150–400)
RBC: 3.46 MIL/uL — ABNORMAL LOW (ref 3.87–5.11)
RDW: 13.3 % (ref 11.5–15.5)
WBC: 10.8 10*3/uL — ABNORMAL HIGH (ref 4.0–10.5)
nRBC: 0 % (ref 0.0–0.2)

## 2021-09-18 LAB — BASIC METABOLIC PANEL
Anion gap: 7 (ref 5–15)
BUN: 12 mg/dL (ref 6–20)
CO2: 20 mmol/L — ABNORMAL LOW (ref 22–32)
Calcium: 8.9 mg/dL (ref 8.9–10.3)
Chloride: 112 mmol/L — ABNORMAL HIGH (ref 98–111)
Creatinine, Ser: 1.5 mg/dL — ABNORMAL HIGH (ref 0.44–1.00)
GFR, Estimated: 40 mL/min — ABNORMAL LOW (ref >60)
Glucose, Bld: 113 mg/dL — ABNORMAL HIGH (ref 70–99)
Potassium: 3.7 mmol/L (ref 3.5–5.1)
Sodium: 139 mmol/L (ref 135–145)

## 2021-09-18 LAB — MAGNESIUM: Magnesium: 2 mg/dL (ref 1.7–2.4)

## 2021-09-18 MED ORDER — ENOXAPARIN SODIUM 40 MG/0.4ML IJ SOSY
40.0000 mg | PREFILLED_SYRINGE | INTRAMUSCULAR | Status: DC
  Administered 2021-09-18: 40 mg via SUBCUTANEOUS
  Filled 2021-09-18: qty 0.4

## 2021-09-18 MED ORDER — SODIUM CHLORIDE 0.9 % IV SOLN: INTRAVENOUS | Status: DC

## 2021-09-18 MED ORDER — LOPERAMIDE HCL 2 MG PO CAPS
2.0000 mg | ORAL_CAPSULE | Freq: Four times a day (QID) | ORAL | Status: DC | PRN
Start: 2021-09-18 — End: 2021-09-19

## 2021-09-18 NOTE — Progress Notes (Addendum)
PROGRESS NOTE    Teresa Lee  GQQ:[id number] DOB: November 21, 1960 DOA: 09/16/2021 PCP: [name], MD   Brief Narrative:  61 y.o. female with medical history significant for chronic pain syndrome with chronic opioid use, OSA, myasthenia gravis on chronic prednisone, chron's disease, history of back surgery, cholecystectomy, essential hypertension, hyperlipidemia, GERD, chronic anxiety/depression on chronic benzodiazepine presented with nausea, vomiting and diarrhea.  On presentation, potassium was 3.1, bicarb was 20, creatinine of 2.04 and magnesium of 1.6 with WBC of 20.7.  CT of the abdomen and pelvis showed no acute findings.  She was started on IV fluids.  Assessment & Plan:   Acute kidney injury Acute metabolic acidosis -Possibly prerenal from nausea, vomiting and diarrhea. -Treated with IV fluids.  Creatinine improving from 2.04 to 1.50 today.  CT of the abdomen did not show any renal abnormalities. -Continue IV fluids.  Repeat a.m. labs.  Possible acute gastroenteritis presenting with nausea, vomiting, diarrhea -Improving but still having intermittent nausea and diarrhea.  Stool for C. difficile and GI PCR negative.  Discontinue isolation. -Advance diet to soft diet today.  Hypokalemia -Improved  Hypomagnesemia -Improved  Leukocytosis -Resolved   asymptomatic bacteriuria -Monitor off antibiotics  Myasthenia gravis with chronic prednisone use -No acute issues.  Continue home regimen   Crohn's disease -Continue home regimen.  Outpatient follow-up with GI.   DVT prophylaxis: Lovenox Code Status: Full Family Communication: Husband at bedside Disposition Plan: Status is: inpatient because: Of severity of illness.  Need for IV fluids.    Consultants: None  Procedures: None  Antimicrobials: None   Subjective: Patient seen and examined at bedside.  Tolerating liquid diet but still complains of intermittent nausea and some diarrhea.  Does not feel ready  enough to go home yet.  Wants to try solid food today.  No overnight fever no chest pain or shortness of breath reported. Objective: Vitals:   09/17/21 0838 09/17/21 1305 09/17/21 2030 09/18/21 0657  BP: 127/82 (!) 160/87 (!) 165/91 125/73  Pulse: 96 (!) 110 80 81  Resp: 16 18 16 16   Temp: 98.3 F (36.8 C) 97.8 F (36.6 C) 98.3 F (36.8 C) 98.6 F (37 C)  TempSrc: Oral Oral Oral Oral  SpO2: 99% 99% 100% 96%  Weight:      Height:        Intake/Output Summary (Last 24 hours) at 09/18/2021 1044 Last data filed at 09/17/2021 2030 Gross per 24 hour  Intake 1954.28 ml  Output 600 ml  Net 1354.28 ml    Filed Weights   09/16/21 0753  Weight: 67 kg    Examination:  General: On room air.  No distress ENT/neck: No thyromegaly.  JVD is not elevated  respiratory: Decreased breath sounds at bases bilaterally with some crackles; no wheezing  CVS: S1-S2 heard, rate controlled currently Abdominal: Soft, mild tender in the lower quadrants; slightly distended; no organomegaly, bowel sounds are heard Extremities: Trace lower extremity edema; no cyanosis  CNS: Awake and alert.  Slow to respond.  No focal neurologic deficit.  Moves extremities Lymph: No obvious lymphadenopathy Skin: No obvious ecchymosis/lesions  psych: Extremely flat affect.  Currently not agitated.  Musculoskeletal: No obvious joint swelling/deformity    Data Reviewed: I have personally reviewed following labs and imaging studies  CBC: Recent Labs  Lab 09/16/21 0826 09/17/21 0621 09/18/21 0622  WBC 20.7* 11.1* 10.8*  NEUTROABS 14.2* 8.1* 6.9  HGB 14.4 12.7 10.9*  HCT 41.7 37.3 32.1*  MCV 91.9 93.7 92.8  PLT 248  156 093    Basic Metabolic Panel: Recent Labs  Lab 09/16/21 0826 09/17/21 0621 09/18/21 0622  NA 138 139 139  K 3.1* 2.8* 3.7  CL 106 109 112*  CO2 20* 20* 20*  GLUCOSE 129* 106* 113*  BUN 29* 21* 12  CREATININE 2.04* 1.56* 1.50*  CALCIUM 9.6 8.6* 8.9  MG 1.6* 2.8* 2.0  PHOS  --  3.5  --      GFR: Estimated Creatinine Clearance: 35.4 mL/min (A) (by C-G formula based on SCr of 1.5 mg/dL (H)). Liver Function Tests: Recent Labs  Lab 09/16/21 0826 09/17/21 0621  AST 44* 36  ALT 20 26  ALKPHOS 47 45  BILITOT 0.8 0.7  PROT 7.8 6.6  ALBUMIN 3.9 3.5    Recent Labs  Lab 09/16/21 0826  LIPASE 96*    No results for input(s): "AMMONIA" in the last 168 hours. Coagulation Profile: No results for input(s): "INR", "PROTIME" in the last 168 hours. Cardiac Enzymes: No results for input(s): "CKTOTAL", "CKMB", "CKMBINDEX", "TROPONINI" in the last 168 hours. BNP (last 3 results) No results for input(s): "PROBNP" in the last 8760 hours. HbA1C: No results for input(s): "HGBA1C" in the last 72 hours. CBG: No results for input(s): "GLUCAP" in the last 168 hours. Lipid Profile: No results for input(s): "CHOL", "HDL", "LDLCALC", "TRIG", "CHOLHDL", "LDLDIRECT" in the last 72 hours. Thyroid Function Tests: No results for input(s): "TSH", "T4TOTAL", "FREET4", "T3FREE", "THYROIDAB" in the last 72 hours. Anemia Panel: No results for input(s): "VITAMINB12", "FOLATE", "FERRITIN", "TIBC", "IRON", "RETICCTPCT" in the last 72 hours. Sepsis Labs: No results for input(s): "PROCALCITON", "LATICACIDVEN" in the last 168 hours.  Recent Results (from the past 240 hour(s))  C Difficile Quick Screen w PCR reflex     Status: None   Collection Time: 09/17/21  1:34 AM   Specimen: STOOL  Result Value Ref Range Status   C Diff antigen NEGATIVE NEGATIVE Final   C Diff toxin NEGATIVE NEGATIVE Final   C Diff interpretation No C. difficile detected.  Final    Comment: Performed at [facility], [place] [address] [place], [place] [address]  Gastrointestinal Panel by PCR , Stool     Status: None   Collection Time: 09/17/21  1:34 AM   Specimen: STOOL  Result Value Ref Range Status   Campylobacter species NOT DETECTED NOT DETECTED Final   Plesimonas shigelloides NOT DETECTED NOT  DETECTED Final   Salmonella species NOT DETECTED NOT DETECTED Final   Yersinia enterocolitica NOT DETECTED NOT DETECTED Final   Vibrio species NOT DETECTED NOT DETECTED Final   Vibrio cholerae NOT DETECTED NOT DETECTED Final   Enteroaggregative E coli (EAEC) NOT DETECTED NOT DETECTED Final   Enteropathogenic E coli (EPEC) NOT DETECTED NOT DETECTED Final   Enterotoxigenic E coli (ETEC) NOT DETECTED NOT DETECTED Final   Shiga like toxin producing E coli (STEC) NOT DETECTED NOT DETECTED Final   Shigella/Enteroinvasive E coli (EIEC) NOT DETECTED NOT DETECTED Final   Cryptosporidium NOT DETECTED NOT DETECTED Final   Cyclospora cayetanensis NOT DETECTED NOT DETECTED Final   Entamoeba histolytica NOT DETECTED NOT DETECTED Final   Giardia lamblia NOT DETECTED NOT DETECTED Final   Adenovirus F40/41 NOT DETECTED NOT DETECTED Final   Astrovirus NOT DETECTED NOT DETECTED Final   Norovirus GI/GII NOT DETECTED NOT DETECTED Final   Rotavirus A NOT DETECTED NOT DETECTED Final   Sapovirus (I, II, IV, and V) NOT DETECTED NOT DETECTED Final    Comment: Performed at [facility]  [facility], [address] [place], [place] [address]         Radiology Studies: No results found.      Scheduled Meds:  enoxaparin (LOVENOX) injection  30 mg Subcutaneous Q24H   mycophenolate  1,000 mg Oral BID   pantoprazole  40 mg Oral BID   predniSONE  10 mg Oral q morning   pregabalin  50 mg Oral QHS   rOPINIRole  1 mg Oral QHS   rosuvastatin  40 mg Oral Daily   Continuous Infusions:  sodium chloride Stopped (09/17/21 0101)          Aline August, MD [facility] 09/18/2021, 10:44 AM

## 2021-09-18 NOTE — Plan of Care (Signed)

## 2021-09-18 NOTE — Progress Notes (Signed)
Pt states she would like to eat in the morning. Says she feels better compared to admission.

## 2021-09-19 ENCOUNTER — Ambulatory Visit (HOSPITAL_BASED_OUTPATIENT_CLINIC_OR_DEPARTMENT_OTHER): Payer: Medicare HMO | Admitting: Physical Therapy

## 2021-09-19 DIAGNOSIS — K529 Noninfective gastroenteritis and colitis, unspecified: Secondary | ICD-10-CM | POA: Diagnosis not present

## 2021-09-19 DIAGNOSIS — E876 Hypokalemia: Secondary | ICD-10-CM | POA: Diagnosis not present

## 2021-09-19 DIAGNOSIS — N179 Acute kidney failure, unspecified: Secondary | ICD-10-CM | POA: Diagnosis not present

## 2021-09-19 DIAGNOSIS — K5000 Crohn's disease of small intestine without complications: Secondary | ICD-10-CM | POA: Diagnosis not present

## 2021-09-19 LAB — CBC WITH DIFFERENTIAL/PLATELET
Abs Immature Granulocytes: 0.47 10*3/uL — ABNORMAL HIGH (ref 0.00–0.07)
Basophils Absolute: 0.1 10*3/uL (ref 0.0–0.1)
Basophils Relative: 1 %
Eosinophils Absolute: 0.2 10*3/uL (ref 0.0–0.5)
Eosinophils Relative: 2 %
HCT: 30.7 % — ABNORMAL LOW (ref 36.0–46.0)
Hemoglobin: 10.3 g/dL — ABNORMAL LOW (ref 12.0–15.0)
Immature Granulocytes: 5 %
Lymphocytes Relative: 26 %
Lymphs Abs: 2.6 10*3/uL (ref 0.7–4.0)
MCH: 31.4 pg (ref 26.0–34.0)
MCHC: 33.6 g/dL (ref 30.0–36.0)
MCV: 93.6 fL (ref 80.0–100.0)
Monocytes Absolute: 0.8 10*3/uL (ref 0.1–1.0)
Monocytes Relative: 8 %
Neutro Abs: 6 10*3/uL (ref 1.7–7.7)
Neutrophils Relative %: 58 %
Platelets: 144 10*3/uL — ABNORMAL LOW (ref 150–400)
RBC: 3.28 MIL/uL — ABNORMAL LOW (ref 3.87–5.11)
RDW: 13.2 % (ref 11.5–15.5)
WBC: 10.2 10*3/uL (ref 4.0–10.5)
nRBC: 0 % (ref 0.0–0.2)

## 2021-09-19 LAB — MAGNESIUM: Magnesium: 1.7 mg/dL (ref 1.7–2.4)

## 2021-09-19 LAB — BASIC METABOLIC PANEL
Anion gap: 8 (ref 5–15)
BUN: 14 mg/dL (ref 6–20)
CO2: 21 mmol/L — ABNORMAL LOW (ref 22–32)
Calcium: 8.2 mg/dL — ABNORMAL LOW (ref 8.9–10.3)
Chloride: 115 mmol/L — ABNORMAL HIGH (ref 98–111)
Creatinine, Ser: 1.33 mg/dL — ABNORMAL HIGH (ref 0.44–1.00)
GFR, Estimated: 46 mL/min — ABNORMAL LOW (ref >60)
Glucose, Bld: 135 mg/dL — ABNORMAL HIGH (ref 70–99)
Potassium: 3 mmol/L — ABNORMAL LOW (ref 3.5–5.1)
Sodium: 144 mmol/L (ref 135–145)

## 2021-09-19 MED ORDER — LOPERAMIDE HCL 2 MG PO CAPS: 4.0000 mg | ORAL_CAPSULE | ORAL | 0 refills | Status: DC | PRN

## 2021-09-19 MED ORDER — POTASSIUM CHLORIDE CRYS ER 20 MEQ PO TBCR
40.0000 meq | EXTENDED_RELEASE_TABLET | ORAL | Status: DC
  Administered 2021-09-19: 40 meq via ORAL
  Filled 2021-09-19: qty 2

## 2021-09-19 NOTE — Progress Notes (Signed)
Nursing Discharge Note   Admit Date: 09/16/2021   Discharge date: 09/19/2021    Teresa Lee is to be discharged home per MD order.  AVS completed. Reviewed with patient at bedside. Copy of AVS  provided for patient to take home.  Patient able to verbalize understanding of discharge instructions. PIV removed. Patient stable upon discharge.   Discharge Instructions     Diet - low sodium heart healthy   Complete by: As directed    Increase activity slowly   Complete by: As directed        Allergies as of 09/19/2021       Reactions   Duloxetine Nausea Only   Latex Other (See Comments)   BLISTER   Lorazepam Other (See Comments)   Hallucinations   Orange Concentrate [flavoring Agent] Other (See Comments)   Acid reflux   Other Hives, Nausea Only, Other (See Comments)   Some nuts cause a rash   Percocet [oxycodone-acetaminophen] Hives   Sulfa Antibiotics Hives   Bioflavonoids Rash   Causes burning of skin and blisters   Cefixime Other (See Comments)   Due to myasthenia gravis   Nucynta [tapentadol] Other (See Comments)   Headache         Medication List     STOP taking these medications    amoxicillin-clavulanate 875-125 MG tablet Commonly known as: AUGMENTIN   losartan 50 MG tablet Commonly known as: COZAAR   meloxicam 15 MG tablet Commonly known as: MOBIC   Potassium Chloride ER 20 MEQ Tbcr   Promethegan 25 MG suppository Generic drug: promethazine       TAKE these medications    AeroChamber Plus inhaler Use as instructed to use with inahaler.   albuterol 108 (90 Base) MCG/ACT inhaler Commonly known as: VENTOLIN HFA 1 PUFF EVERY 6 HOURS AS NEEDED FOR WHEEZING What changed: See the new instructions.   albuterol (2.5 MG/3ML) 0.083% nebulizer solution Commonly known as: PROVENTIL Take 2.5 mg by nebulization every 6 (six) hours as needed for shortness of breath. What changed: Another medication with the same name was changed. Make sure you understand how  and when to take each.   B-12 PO Take 1 tablet by mouth daily.   cholestyramine light 4 g packet Commonly known as: PREVALITE Take 1 packet (4 g total) by mouth 3 (three) times daily.   desmopressin 0.2 MG tablet Commonly known as: DDAVP Take 600 mcg by mouth at bedtime as needed.   estradiol 2 MG tablet Commonly known as: ESTRACE Take 2 mg by mouth at bedtime.   HYDROcodone-acetaminophen 10-325 MG tablet Commonly known as: NORCO Take 1 tablet by mouth 5 (five) times daily as needed for moderate pain.   loperamide 2 MG capsule Commonly known as: IMODIUM Take 2 capsules (4 mg total) by mouth as needed for diarrhea or loose stools.   methocarbamol 750 MG tablet Commonly known as: ROBAXIN Take 750 mg by mouth at bedtime.   metoprolol succinate 25 MG 24 hr tablet Commonly known as: TOPROL-XL Take 25 mg by mouth daily.   mycophenolate 500 MG tablet Commonly known as: CELLCEPT Take 1,000 mg by mouth 2 (two) times daily.   naloxone 4 MG/0.1ML Liqd nasal spray kit Commonly known as: NARCAN Place 1 spray into the nose once as needed (opioid overdose).   pantoprazole 40 MG tablet Commonly known as: PROTONIX TAKE 1 TABLET DAILY What changed: when to take this   potassium chloride SA 20 MEQ tablet Commonly known as: KLOR-CON M Take  20 mEq by mouth 2 (two) times daily.   predniSONE 10 MG tablet Commonly known as: DELTASONE Take 10 mg by mouth every morning.   pregabalin 50 MG capsule Commonly known as: LYRICA Take 50 mg by mouth at bedtime.   progesterone 100 MG capsule Commonly known as: PROMETRIUM Take 100 mg by mouth at bedtime.   rOPINIRole 1 MG tablet Commonly known as: REQUIP Take 1-2 tablets (1-2 mg total) by mouth at bedtime. What changed:  how much to take when to take this additional instructions   rosuvastatin 40 MG tablet Commonly known as: Crestor Take 1 tablet (40 mg total) by mouth daily.   Symbicort 80-4.5 MCG/ACT inhaler Generic drug:  budesonide-formoterol Inhale 2 puffs into the lungs 2 (two) times daily. What changed:  when to take this reasons to take this        Discharge Instructions/ Education: Discharge instructions given to patient with verbalized understanding. Discharge education completed with patient including: follow up instructions, medication list, discharge activities, and limitations if indicated.  Patient instructed to return to Emergency Department, call 911, or call MD for any changes in condition.  Patient escorted via wheelchair to lobby and discharged home via private automobile.

## 2021-09-19 NOTE — Discharge Summary (Signed)
Physician Discharge Summary  Teresa Lee XHB:[id number] DOB: [birth year] DOA: 09/16/2021  PCP: [name], MD  Admit date: 09/16/2021 Discharge date: 09/19/2021  Admitted From: Home Disposition: Home  Recommendations for Outpatient Follow-up:  Follow up with PCP in 1 week with repeat BMP Outpatient follow-up with GI Follow up in ED if symptoms worsen or new appear   Home Health: No Equipment/Devices: None  Discharge Condition: Stable CODE STATUS: Full Diet recommendation: Heart healthy  Brief/Interim Summary: 61 y.o. female with medical history significant for chronic pain syndrome with chronic opioid use, OSA, myasthenia gravis on chronic prednisone, chron's disease, history of back surgery, cholecystectomy, essential hypertension, hyperlipidemia, GERD, chronic anxiety/depression on chronic benzodiazepine presented with nausea, vomiting and diarrhea.  On presentation, potassium was 3.1, bicarb was 20, creatinine of 2.04 and magnesium of 1.6 with WBC of 20.7.  CT of the abdomen and pelvis showed no acute findings.  She was started on IV fluids.  During the hospitalization, her condition has improved.  She is currently tolerating diet.  She feels better and wants to go home today.  She will be discharged home with close outpatient follow-up with PCP and GI.  Discharge Diagnoses:   Acute kidney injury Acute metabolic acidosis -Possibly prerenal from nausea, vomiting and diarrhea. -Treated with IV fluids.  Creatinine improving from 2.04 to 1.33 today.  CT of the abdomen did not show any renal abnormalities. -Treated with IV fluids. -Outpatient follow-up of BMP.  We will keep losartan on hold till reevaluation by PCP.  Possible acute gastroenteritis presenting with nausea, vomiting, diarrhea -Improving, diarrhea has improved.  Currently tolerating soft diet.  Stool for C. difficile and GI PCR negative.  Discontinued isolation. -Outpatient follow-up.    Hypokalemia -Continue replacement on discharge  Hypomagnesemia -Improved  Leukocytosis -Resolved    asymptomatic bacteriuria -Monitor off antibiotics   Myasthenia gravis with chronic prednisone use -No acute issues.  Continue home regimen   Crohn's disease -Continue home regimen.  Outpatient follow-up with GI.    Discharge Instructions  Discharge Instructions     Diet - low sodium heart healthy   Complete by: As directed    Increase activity slowly   Complete by: As directed       Allergies as of 09/19/2021       Reactions   Duloxetine Nausea Only   Latex Other (See Comments)   BLISTER   Lorazepam Other (See Comments)   Hallucinations   Orange Concentrate [flavoring Agent] Other (See Comments)   Acid reflux   Other Hives, Nausea Only, Other (See Comments)   Some nuts cause a rash   Percocet [oxycodone-acetaminophen] Hives   Sulfa Antibiotics Hives   Bioflavonoids Rash   Causes burning of skin and blisters   Cefixime Other (See Comments)   Due to myasthenia gravis   Nucynta [tapentadol] Other (See Comments)   Headache         Medication List     STOP taking these medications    amoxicillin-clavulanate 875-125 MG tablet Commonly known as: AUGMENTIN   losartan 50 MG tablet Commonly known as: COZAAR   meloxicam 15 MG tablet Commonly known as: MOBIC   Potassium Chloride ER 20 MEQ Tbcr   Promethegan 25 MG suppository Generic drug: promethazine       TAKE these medications    AeroChamber Plus inhaler Use as instructed to use with inahaler.   albuterol 108 (90 Base) MCG/ACT inhaler Commonly known as: VENTOLIN HFA 1 PUFF EVERY 6 HOURS AS NEEDED FOR  WHEEZING What changed: See the new instructions.   albuterol (2.5 MG/3ML) 0.083% nebulizer solution Commonly known as: PROVENTIL Take 2.5 mg by nebulization every 6 (six) hours as needed for shortness of breath. What changed: Another medication with the same name was changed. Make sure you  understand how and when to take each.   B-12 PO Take 1 tablet by mouth daily.   cholestyramine light 4 g packet Commonly known as: PREVALITE Take 1 packet (4 g total) by mouth 3 (three) times daily.   desmopressin 0.2 MG tablet Commonly known as: DDAVP Take 600 mcg by mouth at bedtime as needed.   estradiol 2 MG tablet Commonly known as: ESTRACE Take 2 mg by mouth at bedtime.   HYDROcodone-acetaminophen 10-325 MG tablet Commonly known as: NORCO Take 1 tablet by mouth 5 (five) times daily as needed for moderate pain.   loperamide 2 MG capsule Commonly known as: IMODIUM Take 2 capsules (4 mg total) by mouth as needed for diarrhea or loose stools.   methocarbamol 750 MG tablet Commonly known as: ROBAXIN Take 750 mg by mouth at bedtime.   metoprolol succinate 25 MG 24 hr tablet Commonly known as: TOPROL-XL Take 25 mg by mouth daily.   mycophenolate 500 MG tablet Commonly known as: CELLCEPT Take 1,000 mg by mouth 2 (two) times daily.   naloxone 4 MG/0.1ML Liqd nasal spray kit Commonly known as: NARCAN Place 1 spray into the nose once as needed (opioid overdose).   pantoprazole 40 MG tablet Commonly known as: PROTONIX TAKE 1 TABLET DAILY What changed: when to take this   potassium chloride SA 20 MEQ tablet Commonly known as: KLOR-CON M Take 20 mEq by mouth 2 (two) times daily.   predniSONE 10 MG tablet Commonly known as: DELTASONE Take 10 mg by mouth every morning.   pregabalin 50 MG capsule Commonly known as: LYRICA Take 50 mg by mouth at bedtime.   progesterone 100 MG capsule Commonly known as: PROMETRIUM Take 100 mg by mouth at bedtime.   rOPINIRole 1 MG tablet Commonly known as: REQUIP Take 1-2 tablets (1-2 mg total) by mouth at bedtime. What changed:  how much to take when to take this additional instructions   rosuvastatin 40 MG tablet Commonly known as: Crestor Take 1 tablet (40 mg total) by mouth daily.   Symbicort 80-4.5 MCG/ACT  inhaler Generic drug: budesonide-formoterol Inhale 2 puffs into the lungs 2 (two) times daily. What changed:  when to take this reasons to take this        Follow-up Information     [name], MD. Schedule an appointment as soon as possible for a visit in 1 week(s).   Specialty: Family Medicine Why: with repeat bmp Contact information: [name] [address] [phone]         Wilford Corner, MD. Schedule an appointment as soon as possible for a visit in 1 week(s).   Specialty: Gastroenterology Contact information: [address] [place] [address] [phone]                Allergies  Allergen Reactions   Duloxetine Nausea Only   Latex Other (See Comments)    BLISTER   Lorazepam Other (See Comments)    Hallucinations   Orange Concentrate [Flavoring Agent] Other (See Comments)    Acid reflux   Other Hives, Nausea Only and Other (See Comments)    Some nuts cause a rash   Percocet [Oxycodone-Acetaminophen] Hives  Sulfa Antibiotics Hives   Bioflavonoids Rash    Causes burning of skin and blisters   Cefixime Other (See Comments)    Due to myasthenia gravis   Nucynta [Tapentadol] Other (See Comments)    Headache     Consultations: None   Procedures/Studies: CT ABDOMEN PELVIS WO CONTRAST  Result Date: 09/16/2021 CLINICAL DATA:  Acute abdominal pain, nausea, vomiting, diarrhea EXAM: CT ABDOMEN AND PELVIS WITHOUT CONTRAST TECHNIQUE: Multidetector CT imaging of the abdomen and pelvis was performed following the standard protocol without IV contrast. RADIATION DOSE REDUCTION: This exam was performed according to the departmental dose-optimization program which includes automated exposure control, adjustment of the mA and/or kV according to patient size and/or use of iterative reconstruction technique. COMPARISON:  06/02/2020 FINDINGS: Lower chest: No pleural or pericardial effusion. Visualized lung bases clear.  Hepatobiliary: Fatty liver without focal lesion. Cholecystectomy clips. Pancreas: Unremarkable. No pancreatic ductal dilatation or surrounding inflammatory changes. Spleen: Normal in size without focal abnormality. Adrenals/Urinary Tract: Adrenal glands are unremarkable. Kidneys are normal, without renal calculi, focal lesion, or hydronephrosis. Bladder is unremarkable. Stomach/Bowel: The stomach is incompletely distended. Small bowel decompressed. Normal appendix. The colon is incompletely distended, unremarkable. Vascular/Lymphatic: Mild scattered aortoiliac calcified plaque without aneurysm. Retroaortic left renal vein, an anatomic variant. No abdominal or pelvic adenopathy. Reproductive: Uterus and bilateral adnexa are unremarkable. Other: Bilateral pelvic phleboliths.  No ascites.  No free air. Musculoskeletal: Implanted dorsal stimulator device, proximal extent of leads not visualized. Instrumented PLIF L4-5. No acute findings. IMPRESSION: 1. No acute findings. 2.  Aortic Atherosclerosis (ICD10-170.0). 3. Fatty liver Electronically Signed   By: Lucrezia Europe M.D.   On: 09/16/2021 09:42      Subjective: Patient seen and examined at bedside.  Feels much better and wants to go home today.  No overnight fever, vomiting, worsening diarrhea reported.  Discharge Exam: Vitals:   09/18/21 2037 09/19/21 0526  BP: (!) 151/92 (!) 135/116  Pulse: 87 80  Resp:    Temp: 98.2 F (36.8 C) 97.7 F (36.5 C)  SpO2: 98% 98%    General: Pt is alert, awake, not in acute distress.  Slow to respond. Cardiovascular: rate controlled, S1/S2 + Respiratory: bilateral decreased breath sounds at bases Abdominal: Soft, NT, ND, bowel sounds + Extremities: Mild lower extremity edema; no cyanosis    The results of significant diagnostics from this hospitalization (including imaging, microbiology, ancillary and laboratory) are listed below for reference.     Microbiology: Recent Results (from the past 240 hour(s))  C  Difficile Quick Screen w PCR reflex     Status: None   Collection Time: 09/17/21  1:34 AM   Specimen: STOOL  Result Value Ref Range Status   C Diff antigen NEGATIVE NEGATIVE Final   C Diff toxin NEGATIVE NEGATIVE Final   C Diff interpretation No C. difficile detected.  Final    Comment: Performed at [facility], [place] [address], [place], [place] [address]  Gastrointestinal Panel by PCR , Stool     Status: None   Collection Time: 09/17/21  1:34 AM   Specimen: STOOL  Result Value Ref Range Status   Campylobacter species NOT DETECTED NOT DETECTED Final   Plesimonas shigelloides NOT DETECTED NOT DETECTED Final   Salmonella species NOT DETECTED NOT DETECTED Final   Yersinia enterocolitica NOT DETECTED NOT DETECTED Final   Vibrio species NOT DETECTED NOT DETECTED Final   Vibrio cholerae NOT DETECTED NOT DETECTED Final   Enteroaggregative E coli (EAEC) NOT DETECTED NOT  DETECTED Final   Enteropathogenic E coli (EPEC) NOT DETECTED NOT DETECTED Final   Enterotoxigenic E coli (ETEC) NOT DETECTED NOT DETECTED Final   Shiga like toxin producing E coli (STEC) NOT DETECTED NOT DETECTED Final   Shigella/Enteroinvasive E coli (EIEC) NOT DETECTED NOT DETECTED Final   Cryptosporidium NOT DETECTED NOT DETECTED Final   Cyclospora cayetanensis NOT DETECTED NOT DETECTED Final   Entamoeba histolytica NOT DETECTED NOT DETECTED Final   Giardia lamblia NOT DETECTED NOT DETECTED Final   Adenovirus F40/41 NOT DETECTED NOT DETECTED Final   Astrovirus NOT DETECTED NOT DETECTED Final   Norovirus GI/GII NOT DETECTED NOT DETECTED Final   Rotavirus A NOT DETECTED NOT DETECTED Final   Sapovirus (I, II, IV, and V) NOT DETECTED NOT DETECTED Final    Comment: Performed at [facility], [place] [place],  [address]     Labs: BNP (last 3 results) No results for input(s): "BNP" in the last 8760 hours. Basic Metabolic Panel: Recent Labs  Lab 09/16/21 0826 09/17/21 0621  09/18/21 0622 09/19/21 0800  NA 138 139 139 144  K 3.1* 2.8* 3.7 3.0*  CL 106 109 112* 115*  CO2 20* 20* 20* 21*  GLUCOSE 129* 106* 113* 135*  BUN 29* 21* 12 14  CREATININE 2.04* 1.56* 1.50* 1.33*  CALCIUM 9.6 8.6* 8.9 8.2*  MG 1.6* 2.8* 2.0 1.7  PHOS  --  3.5  --   --    Liver Function Tests: Recent Labs  Lab 09/16/21 0826 09/17/21 0621  AST 44* 36  ALT 20 26  ALKPHOS 47 45  BILITOT 0.8 0.7  PROT 7.8 6.6  ALBUMIN 3.9 3.5   Recent Labs  Lab 09/16/21 0826  LIPASE 96*   No results for input(s): "AMMONIA" in the last 168 hours. CBC: Recent Labs  Lab 09/16/21 0826 09/17/21 0621 09/18/21 0622 09/19/21 0800  WBC 20.7* 11.1* 10.8* 10.2  NEUTROABS 14.2* 8.1* 6.9 6.0  HGB 14.4 12.7 10.9* 10.3*  HCT 41.7 37.3 32.1* 30.7*  MCV 91.9 93.7 92.8 93.6  PLT 248 156 164 144*   Cardiac Enzymes: No results for input(s): "CKTOTAL", "CKMB", "CKMBINDEX", "TROPONINI" in the last 168 hours. BNP: Invalid input(s): "POCBNP" CBG: No results for input(s): "GLUCAP" in the last 168 hours. D-Dimer No results for input(s): "DDIMER" in the last 72 hours. Hgb A1c No results for input(s): "HGBA1C" in the last 72 hours. Lipid Profile No results for input(s): "CHOL", "HDL", "LDLCALC", "TRIG", "CHOLHDL", "LDLDIRECT" in the last 72 hours. Thyroid function studies No results for input(s): "TSH", "T4TOTAL", "T3FREE", "THYROIDAB" in the last 72 hours.  Invalid input(s): "FREET3" Anemia work up No results for input(s): "VITAMINB12", "FOLATE", "FERRITIN", "TIBC", "IRON", "RETICCTPCT" in the last 72 hours. Urinalysis    Component Value Date/Time   COLORURINE YELLOW 09/16/2021 0915   APPEARANCEUR HAZY (A) 09/16/2021 0915   LABSPEC 1.025 09/16/2021 0915   PHURINE 5.0 09/16/2021 0915   GLUCOSEU NEGATIVE 09/16/2021 0915   HGBUR MODERATE (A) 09/16/2021 0915   BILIRUBINUR NEGATIVE 09/16/2021 0915   KETONESUR NEGATIVE 09/16/2021 0915   PROTEINUR 100 (A) 09/16/2021 0915   NITRITE NEGATIVE  09/16/2021 0915   LEUKOCYTESUR NEGATIVE 09/16/2021 0915   Sepsis Labs Recent Labs  Lab 09/16/21 0826 09/17/21 0621 09/18/21 0622 09/19/21 0800  WBC 20.7* 11.1* 10.8* 10.2   Microbiology Recent Results (from the past 240 hour(s))  C Difficile Quick Screen w PCR reflex     Status: None   Collection Time: 09/17/21  1:34 AM  Specimen: STOOL  Result Value Ref Range Status   C Diff antigen NEGATIVE NEGATIVE Final   C Diff toxin NEGATIVE NEGATIVE Final   C Diff interpretation No C. difficile detected.  Final    Comment: Performed at [facility], [place] [address] [place], [place] [address]  Gastrointestinal Panel by PCR , Stool     Status: None   Collection Time: 09/17/21  1:34 AM   Specimen: STOOL  Result Value Ref Range Status   Campylobacter species NOT DETECTED NOT DETECTED Final   Plesimonas shigelloides NOT DETECTED NOT DETECTED Final   Salmonella species NOT DETECTED NOT DETECTED Final   Yersinia enterocolitica NOT DETECTED NOT DETECTED Final   Vibrio species NOT DETECTED NOT DETECTED Final   Vibrio cholerae NOT DETECTED NOT DETECTED Final   Enteroaggregative E coli (EAEC) NOT DETECTED NOT DETECTED Final   Enteropathogenic E coli (EPEC) NOT DETECTED NOT DETECTED Final   Enterotoxigenic E coli (ETEC) NOT DETECTED NOT DETECTED Final   Shiga like toxin producing E coli (STEC) NOT DETECTED NOT DETECTED Final   Shigella/Enteroinvasive E coli (EIEC) NOT DETECTED NOT DETECTED Final   Cryptosporidium NOT DETECTED NOT DETECTED Final   Cyclospora cayetanensis NOT DETECTED NOT DETECTED Final   Entamoeba histolytica NOT DETECTED NOT DETECTED Final   Giardia lamblia NOT DETECTED NOT DETECTED Final   Adenovirus F40/41 NOT DETECTED NOT DETECTED Final   Astrovirus NOT DETECTED NOT DETECTED Final   Norovirus GI/GII NOT DETECTED NOT DETECTED Final   Rotavirus A NOT DETECTED NOT DETECTED Final   Sapovirus (I, II, IV, and V) NOT DETECTED NOT DETECTED Final    Comment:  Performed at [facility], [address], [place], [place] [address]     Time coordinating discharge: 35 minutes  SIGNED:   Aline August, MD  [facility] 09/19/2021, 9:58 AM

## 2021-09-21 ENCOUNTER — Ambulatory Visit (HOSPITAL_BASED_OUTPATIENT_CLINIC_OR_DEPARTMENT_OTHER): Payer: Medicare HMO | Admitting: Physical Therapy

## 2021-09-27 ENCOUNTER — Ambulatory Visit (HOSPITAL_BASED_OUTPATIENT_CLINIC_OR_DEPARTMENT_OTHER): Payer: Medicare HMO | Admitting: Physical Therapy

## 2021-10-04 ENCOUNTER — Other Ambulatory Visit: Payer: Self-pay | Admitting: Neurological Surgery

## 2021-10-05 ENCOUNTER — Ambulatory Visit (HOSPITAL_BASED_OUTPATIENT_CLINIC_OR_DEPARTMENT_OTHER): Payer: Medicare HMO | Admitting: Physical Therapy

## 2021-10-05 ENCOUNTER — Other Ambulatory Visit: Payer: Self-pay | Admitting: Neurological Surgery

## 2021-10-11 ENCOUNTER — Ambulatory Visit (HOSPITAL_BASED_OUTPATIENT_CLINIC_OR_DEPARTMENT_OTHER): Payer: Medicare HMO | Admitting: Physical Therapy

## 2021-10-17 ENCOUNTER — Encounter (HOSPITAL_COMMUNITY): Payer: Self-pay | Admitting: Neurological Surgery

## 2021-10-17 ENCOUNTER — Other Ambulatory Visit: Payer: Self-pay

## 2021-10-17 NOTE — Anesthesia Preprocedure Evaluation (Signed)
Anesthesia Evaluation  Patient identified by MRN, date of birth, ID band Patient awake    Reviewed: Allergy & Precautions, NPO status , Patient's Chart, lab work & pertinent test results  History of Anesthesia Complications Negative for: history of anesthetic complications  Airway Mallampati: III  TM Distance: >3 FB Neck ROM: Full    Dental  (+) Teeth Intact, Dental Advisory Given   Pulmonary neg shortness of breath, asthma , neg sleep apnea, neg recent URI, neg PE   breath sounds clear to auscultation       Cardiovascular hypertension, Pt. on medications (-) angina(-) Past MI and (-) CHF  Rhythm:Regular  1. Left ventricular ejection fraction, by visual estimation, is 60 to  65%. The left ventricle has normal function. There is no left ventricular  hypertrophy.  2. Global right ventricle has normal systolic function.The right  ventricular size is normal. No increase in right ventricular wall  thickness.  3. Left atrial size was normal.  4. Right atrial size was normal.  5. The mitral valve is normal in structure. Mild mitral valve  regurgitation.  6. The tricuspid valve is grossly normal. Tricuspid valve regurgitation  is trivial.  7. The aortic valve is grossly normal. Aortic valve regurgitation is  mild.  8. The pulmonic valve was grossly normal. Pulmonic valve regurgitation is  trivial.  9. Normal pulmonary artery systolic pressure.  10. The atrial septum is grossly normal.    Neuro/Psych PSYCHIATRIC DISORDERS Anxiety Myasthenia Gravis  Neuromuscular disease    GI/Hepatic Neg liver ROS, GERD  Medicated and Controlled,  Endo/Other  negative endocrine ROS  Renal/GU CRFRenal diseaseLab Results      Component                Value               Date                      CREATININE               1.33 (H)            09/19/2021                Musculoskeletal  (+) Arthritis , Fibromyalgia -  Abdominal    Peds  Hematology  (+) Blood dyscrasia, anemia , Lab Results      Component                Value               Date                      WBC                      10.2                09/19/2021                HGB                      10.3 (L)            09/19/2021                HCT                      30.7 (L)  09/19/2021                MCV                      93.6                09/19/2021                PLT                      144 (L)             09/19/2021              Anesthesia Other Findings Pertinent history includes chronic pain syndrome with chronic opioid use, mild OSA (declined CPAP), myasthenia gravis on chronic prednisone, HTN, Corhn's, moderate persistent asthma, GERD, chronic anxiety/depression on chronic benzodiazepine. Recent admission 6/30-09/19/21 for N/V/D resulting in AKI and metabolic acidosis. Treated with IVF, creatinine improved 2.04 >> 1.33. Had outpatient followup with PCP 09/30/21, doing well at that time, CMP showed creatinine 1.22, otherwise unremarkable. CBC with mild chronic leukocytosis WBC 16.4, otherwise unremarkable. A1c 6.8.  Follows with neurology at [facility] for history of myasthenia gravis.  She is s/p thymectomy for this.  Last seen 06/29/2021 and noted to be in pharmacologic remission.  She is maintained on mycophenolate 1000 mg twice daily and prednisone 10 mg daily.  Reproductive/Obstetrics                            Anesthesia Physical Anesthesia Plan  ASA: 3  Anesthesia Plan: General   Post-op Pain Management: Ofirmev IV (intra-op)*   Induction: Intravenous  PONV Risk Score and Plan: 3 and Ondansetron and Dexamethasone  Airway Management Planned: Oral ETT  Additional Equipment: None  Intra-op Plan:   Post-operative Plan: Extubation in OR  Informed Consent: I have reviewed the patients History and Physical, chart, labs and discussed the procedure including the risks, benefits and alternatives for the  proposed anesthesia with the patient or authorized representative who has indicated his/her understanding and acceptance.     Dental advisory given  Plan Discussed with: CRNA  Anesthesia Plan Comments: (PAT note by Karoline Caldwell, PA-C: Pertinent history includes chronic pain syndrome with chronic opioid use, mild OSA (declined CPAP), myasthenia gravis on chronic prednisone, HTN, Corhn's, moderate persistent asthma, GERD, chronic anxiety/depression on chronic benzodiazepine. Recent admission 6/30-09/19/21 for N/V/D resulting in AKI and metabolic acidosis. Treated with IVF, creatinine improved 2.04 >> 1.33. Had outpatient followup with PCP 09/30/21, doing well at that time, CMP showed creatinine 1.22, otherwise unremarkable. CBC with mild chronic leukocytosis WBC 16.4, otherwise unremarkable. A1c 6.8.  Follows with neurology at [facility] for history of myasthenia gravis.  She is s/p thymectomy for this.  Last seen 06/29/2021 and noted to be in pharmacologic remission.  She is maintained on mycophenolate 1000 mg twice daily and prednisone 10 mg daily.  Patient will need day of surgery evaluation.  EKG 06/03/20: Sinus tachycardia. Rate 101.  TTE 02/21/19: 1. Left ventricular ejection fraction, by visual estimation, is 60 to  65%. The left ventricle has normal function. There is no left ventricular  hypertrophy.  2. Global right ventricle has normal systolic function.The right  ventricular size is normal. No increase in right ventricular wall  thickness.  3. Left atrial size was normal.  4. Right atrial size was normal.  5. The mitral valve is normal in  structure. Mild mitral valve  regurgitation.  6. The tricuspid valve is grossly normal. Tricuspid valve regurgitation  is trivial.  7. The aortic valve is grossly normal. Aortic valve regurgitation is  mild.  8. The pulmonic valve was grossly normal. Pulmonic valve regurgitation is  trivial.  9. Normal pulmonary artery systolic pressure.   10. The atrial septum is grossly normal.   Nuclear stress 08/06/2013: Overall Impression: Low risk stress nuclear study with anterior breast attenuation artifact.  LV Ejection Fraction: 72%. LV Wall Motion: NL LV Function; NL Wall Motion )       Anesthesia Quick Evaluation

## 2021-10-17 NOTE — Pre-Procedure Instructions (Signed)
[place], [place] [address] Phone: [phone] Fax: [place], [name] [address] Phone: [phone] Fax: [phone]  [name] [address] Phone: [phone] Fax: [phone]   PCP - [name], MD Cardiologist - Denies  EKG - DOS ECHO - 02/21/19  ERAS Protcol - NPO  Anesthesia review: Y  Patient verbally denies any shortness of breath, fever, cough and chest pain during phone call   -------------  SDW INSTRUCTIONS given:  Your procedure is scheduled on 10/18/21.  Report to [facility] Main Entrance "A" at [place] and check in at the Admitting office.  Call this number if you have problems the morning of surgery:  [phone]   Remember:  Do not eat or drink after midnight the night before your surgery    Take these medicines the morning of surgery with A SIP OF WATER  mycophenolate (CELLCEPT)  pantoprazole (PROTONIX)  predniSONE (DELTASONE) rosuvastatin (CRESTOR) albuterol (PROVENTIL)-if needed albuterol (VENTOLIN HFA) -if needed (please bring on day of surgery HYDROcodone-acetaminophen (NORCO)-if needed loperamide (IMODIUM)-if needed ondansetron (ZOFRAN)-if needed SYMBICORT-if needed  As of today, STOP taking any Aspirin (unless otherwise instructed by your surgeon) Aleve, Naproxen, Ibuprofen, Motrin, Advil, Goody's, BC's, all herbal medications, fish oil, and all vitamins.                      Do not wear jewelry, make up, or nail polish            Do not wear lotions, powders, perfumes/colognes, or deodorant.            Do not shave 48 hours prior to surgery.  Men may shave face and neck.            Do not bring valuables to the hospital.            [facility] is not responsible for any belongings or valuables.  Do NOT Smoke (Tobacco/Vaping)  24 hours prior to your procedure If you use a CPAP at night, you may bring all equipment for your overnight stay.   Contacts, glasses, dentures or bridgework may not be worn into surgery.      For patients admitted to the hospital, discharge time will be determined by your treatment team.   Patients discharged the day of surgery will not be allowed to drive home, and someone needs to stay with them for 24 hours.    Special instructions:   [place]- Preparing For Surgery  Before surgery, you can play an important role. Because skin is not sterile, your skin needs to be as free of germs as possible. You can reduce the number of germs on your skin by washing with CHG (chlorahexidine gluconate) Soap before surgery.  CHG is an antiseptic cleaner which kills germs and bonds with the skin to continue killing germs even after washing.    Oral Hygiene is also important to reduce your risk of infection.  Remember - BRUSH YOUR TEETH THE MORNING OF SURGERY WITH YOUR REGULAR TOOTHPASTE  Please do not use if you have an allergy to CHG or antibacterial soaps. If your skin becomes reddened/irritated stop using the CHG.  Do not shave (including legs and underarms) for at least 48 hours prior to first CHG shower. It is OK to shave  your face.  Please follow these instructions carefully.   Shower the NIGHT BEFORE SURGERY and the MORNING OF SURGERY with DIAL Soap.   Pat yourself dry with a CLEAN TOWEL.  Wear CLEAN PAJAMAS to bed the night before surgery  Place CLEAN SHEETS on your bed the night of your first shower and DO NOT SLEEP WITH PETS.   Day of Surgery: Please shower morning of surgery  Wear Clean/Comfortable clothing the morning of surgery Do not apply any deodorants/lotions.   Remember to brush your teeth WITH YOUR REGULAR TOOTHPASTE.   Questions were answered. Patient verbalized understanding of instructions.

## 2021-10-17 NOTE — Progress Notes (Signed)
Anesthesia Chart Review: Same day workup  Pertinent history includes chronic pain syndrome with chronic opioid use, mild OSA (declined CPAP), myasthenia gravis on chronic prednisone, HTN, Corhn's, moderate persistent asthma, GERD, chronic anxiety/depression on chronic benzodiazepine. Recent admission 6/30-09/19/21 for N/V/D resulting in AKI and metabolic acidosis. Treated with IVF, creatinine improved 2.04 >> 1.33. Had outpatient followup with PCP 09/30/21, doing well at that time, CMP showed creatinine 1.22, otherwise unremarkable. CBC with mild chronic leukocytosis WBC 16.4, otherwise unremarkable. A1c 6.8.  Follows with neurology at [facility] for history of myasthenia gravis.  She is s/p thymectomy for this.  Last seen 06/29/2021 and noted to be in pharmacologic remission.  She is maintained on mycophenolate 1000 mg twice daily and prednisone 10 mg daily.  Patient will need day of surgery evaluation.  EKG 06/03/20: Sinus tachycardia. Rate 101.  TTE 02/21/19:  1. Left ventricular ejection fraction, by visual estimation, is 60 to  65%. The left ventricle has normal function. There is no left ventricular  hypertrophy.   2. Global right ventricle has normal systolic function.The right  ventricular size is normal. No increase in right ventricular wall  thickness.   3. Left atrial size was normal.   4. Right atrial size was normal.   5. The mitral valve is normal in structure. Mild mitral valve  regurgitation.   6. The tricuspid valve is grossly normal. Tricuspid valve regurgitation  is trivial.   7. The aortic valve is grossly normal. Aortic valve regurgitation is  mild.   8. The pulmonic valve was grossly normal. Pulmonic valve regurgitation is  trivial.   9. Normal pulmonary artery systolic pressure.  10. The atrial septum is grossly normal.   Nuclear stress 08/06/2013: Overall Impression:  Low risk stress nuclear study with anterior breast attenuation artifact.   LV Ejection Fraction: 72%.  LV  Wall Motion:  NL LV Function; NL Wall Motion    [facility] Short Stay Center/Anesthesiology Phone [phone] 10/17/2021 1:21 PM

## 2021-10-18 ENCOUNTER — Ambulatory Visit (HOSPITAL_COMMUNITY)
Admission: RE | Admit: 2021-10-18 | Discharge: 2021-10-18 | Disposition: A | Discharge status: Home / Self Care | Payer: Medicare HMO | Attending: Neurological Surgery | Admitting: Neurological Surgery

## 2021-10-18 ENCOUNTER — Ambulatory Visit (HOSPITAL_COMMUNITY): Payer: Medicare HMO

## 2021-10-18 ENCOUNTER — Encounter (HOSPITAL_COMMUNITY): Payer: Self-pay | Admitting: Neurological Surgery

## 2021-10-18 ENCOUNTER — Other Ambulatory Visit: Payer: Self-pay

## 2021-10-18 ENCOUNTER — Ambulatory Visit (HOSPITAL_COMMUNITY): Payer: Medicare HMO | Admitting: Physician Assistant

## 2021-10-18 ENCOUNTER — Encounter (HOSPITAL_COMMUNITY)
Admission: RE | Disposition: A | Discharge status: Home / Self Care | Payer: Self-pay | Source: Home / Self Care | Attending: Neurological Surgery

## 2021-10-18 ENCOUNTER — Ambulatory Visit (HOSPITAL_BASED_OUTPATIENT_CLINIC_OR_DEPARTMENT_OTHER): Payer: Medicare HMO | Admitting: Physician Assistant

## 2021-10-18 DIAGNOSIS — I10 Essential (primary) hypertension: Secondary | ICD-10-CM | POA: Diagnosis not present

## 2021-10-18 DIAGNOSIS — G7000 Myasthenia gravis without (acute) exacerbation: Secondary | ICD-10-CM | POA: Insufficient documentation

## 2021-10-18 DIAGNOSIS — Z7952 Long term (current) use of systemic steroids: Secondary | ICD-10-CM | POA: Insufficient documentation

## 2021-10-18 DIAGNOSIS — I080 Rheumatic disorders of both mitral and aortic valves: Secondary | ICD-10-CM | POA: Insufficient documentation

## 2021-10-18 DIAGNOSIS — D631 Anemia in chronic kidney disease: Secondary | ICD-10-CM | POA: Diagnosis not present

## 2021-10-18 DIAGNOSIS — N189 Chronic kidney disease, unspecified: Secondary | ICD-10-CM | POA: Diagnosis not present

## 2021-10-18 DIAGNOSIS — T85890A Other specified complication of nervous system prosthetic devices, implants and grafts, initial encounter: Secondary | ICD-10-CM | POA: Diagnosis present

## 2021-10-18 DIAGNOSIS — I129 Hypertensive chronic kidney disease with stage 1 through stage 4 chronic kidney disease, or unspecified chronic kidney disease: Secondary | ICD-10-CM | POA: Diagnosis not present

## 2021-10-18 DIAGNOSIS — G4733 Obstructive sleep apnea (adult) (pediatric): Secondary | ICD-10-CM | POA: Diagnosis not present

## 2021-10-18 DIAGNOSIS — F1120 Opioid dependence, uncomplicated: Secondary | ICD-10-CM | POA: Insufficient documentation

## 2021-10-18 DIAGNOSIS — Z4542 Encounter for adjustment and management of neuropacemaker (brain) (peripheral nerve) (spinal cord): Secondary | ICD-10-CM | POA: Diagnosis not present

## 2021-10-18 DIAGNOSIS — M797 Fibromyalgia: Secondary | ICD-10-CM | POA: Insufficient documentation

## 2021-10-18 DIAGNOSIS — G894 Chronic pain syndrome: Secondary | ICD-10-CM | POA: Insufficient documentation

## 2021-10-18 DIAGNOSIS — F32A Depression, unspecified: Secondary | ICD-10-CM | POA: Diagnosis not present

## 2021-10-18 DIAGNOSIS — Z79899 Other long term (current) drug therapy: Secondary | ICD-10-CM | POA: Diagnosis not present

## 2021-10-18 DIAGNOSIS — F419 Anxiety disorder, unspecified: Secondary | ICD-10-CM | POA: Diagnosis not present

## 2021-10-18 DIAGNOSIS — J4540 Moderate persistent asthma, uncomplicated: Secondary | ICD-10-CM | POA: Diagnosis not present

## 2021-10-18 DIAGNOSIS — X58XXXA Exposure to other specified factors, initial encounter: Secondary | ICD-10-CM | POA: Insufficient documentation

## 2021-10-18 DIAGNOSIS — M1990 Unspecified osteoarthritis, unspecified site: Secondary | ICD-10-CM | POA: Insufficient documentation

## 2021-10-18 HISTORY — DX: Sleep apnea, unspecified: G47.30

## 2021-10-18 HISTORY — DX: Anxiety disorder, unspecified: F41.9

## 2021-10-18 HISTORY — PX: SPINAL CORD STIMULATOR INSERTION: SHX5378

## 2021-10-18 LAB — SURGICAL PCR SCREEN
MRSA, PCR: NEGATIVE
Staphylococcus aureus: NEGATIVE

## 2021-10-18 SURGERY — INSERTION, SPINAL CORD STIMULATOR, LUMBAR
Anesthesia: General | Site: Spine Lumbar

## 2021-10-18 MED ORDER — MIDAZOLAM HCL 2 MG/2ML IJ SOLN
INTRAMUSCULAR | Status: DC | PRN
  Administered 2021-10-18: 2 mg via INTRAVENOUS

## 2021-10-18 MED ORDER — ACETAMINOPHEN 10 MG/ML IV SOLN
1000.0000 mg | Freq: Once | INTRAVENOUS | Status: DC | PRN
  Administered 2021-10-18: 1000 mg via INTRAVENOUS

## 2021-10-18 MED ORDER — HYDROCODONE-ACETAMINOPHEN 5-325 MG PO TABS
ORAL_TABLET | ORAL | Status: AC
  Filled 2021-10-18: qty 1

## 2021-10-18 MED ORDER — FENTANYL CITRATE (PF) 100 MCG/2ML IJ SOLN
25.0000 ug | INTRAMUSCULAR | Status: DC | PRN
  Administered 2021-10-18: 25 ug via INTRAVENOUS

## 2021-10-18 MED ORDER — MIDAZOLAM HCL 2 MG/2ML IJ SOLN
INTRAMUSCULAR | Status: AC
  Filled 2021-10-18: qty 2

## 2021-10-18 MED ORDER — DEXAMETHASONE SODIUM PHOSPHATE 10 MG/ML IJ SOLN
INTRAMUSCULAR | Status: DC | PRN
  Administered 2021-10-18: 4 mg via INTRAVENOUS

## 2021-10-18 MED ORDER — LIDOCAINE-EPINEPHRINE 1 %-1:100000 IJ SOLN
INTRAMUSCULAR | Status: AC
  Filled 2021-10-18: qty 1

## 2021-10-18 MED ORDER — FENTANYL CITRATE (PF) 100 MCG/2ML IJ SOLN
INTRAMUSCULAR | Status: AC
  Filled 2021-10-18: qty 2

## 2021-10-18 MED ORDER — FENTANYL CITRATE (PF) 250 MCG/5ML IJ SOLN
INTRAMUSCULAR | Status: AC
  Filled 2021-10-18: qty 5

## 2021-10-18 MED ORDER — DEXAMETHASONE SODIUM PHOSPHATE 10 MG/ML IJ SOLN
INTRAMUSCULAR | Status: AC
Start: 2021-10-18 — End: ?
  Filled 2021-10-18: qty 1

## 2021-10-18 MED ORDER — ORAL CARE MOUTH RINSE: 15.0000 mL | Freq: Once | OROMUCOSAL | Status: AC

## 2021-10-18 MED ORDER — VANCOMYCIN HCL IN DEXTROSE 1-5 GM/200ML-% IV SOLN
1000.0000 mg | INTRAVENOUS | Status: AC
  Administered 2021-10-18: 1000 mg via INTRAVENOUS
  Filled 2021-10-18: qty 200

## 2021-10-18 MED ORDER — PROPOFOL 10 MG/ML IV BOLUS
INTRAVENOUS | Status: AC
  Filled 2021-10-18: qty 20

## 2021-10-18 MED ORDER — HYDROCODONE-ACETAMINOPHEN 7.5-325 MG PO TABS: 1.0000 | ORAL_TABLET | Freq: Once | ORAL | Status: DC | PRN

## 2021-10-18 MED ORDER — BUPIVACAINE HCL (PF) 0.5 % IJ SOLN
INTRAMUSCULAR | Status: AC
  Filled 2021-10-18: qty 30

## 2021-10-18 MED ORDER — PHENYLEPHRINE 80 MCG/ML (10ML) SYRINGE FOR IV PUSH (FOR BLOOD PRESSURE SUPPORT)
PREFILLED_SYRINGE | INTRAVENOUS | Status: AC
Start: 2021-10-18 — End: ?
  Filled 2021-10-18: qty 20

## 2021-10-18 MED ORDER — ACETAMINOPHEN 500 MG PO TABS: 1000.0000 mg | ORAL_TABLET | Freq: Once | ORAL | Status: DC | PRN

## 2021-10-18 MED ORDER — BUPIVACAINE-EPINEPHRINE (PF) 0.5% -1:200000 IJ SOLN
INTRAMUSCULAR | Status: DC | PRN
  Administered 2021-10-18: 10 mL
  Administered 2021-10-18: 5 mL

## 2021-10-18 MED ORDER — LIDOCAINE-EPINEPHRINE 1 %-1:100000 IJ SOLN
INTRAMUSCULAR | Status: DC | PRN
  Administered 2021-10-18: 10 mL
  Administered 2021-10-18: 5 mL

## 2021-10-18 MED ORDER — ACETAMINOPHEN 10 MG/ML IV SOLN
INTRAVENOUS | Status: AC
  Filled 2021-10-18: qty 100

## 2021-10-18 MED ORDER — DEXAMETHASONE SODIUM PHOSPHATE 10 MG/ML IJ SOLN
INTRAMUSCULAR | Status: AC
  Filled 2021-10-18: qty 1

## 2021-10-18 MED ORDER — ACETAMINOPHEN 160 MG/5ML PO SOLN: 1000.0000 mg | Freq: Once | ORAL | Status: DC | PRN

## 2021-10-18 MED ORDER — LACTATED RINGERS IV SOLN: INTRAVENOUS | Status: DC

## 2021-10-18 MED ORDER — PROPOFOL 10 MG/ML IV BOLUS
INTRAVENOUS | Status: DC | PRN
  Administered 2021-10-18: 200 mg via INTRAVENOUS
  Administered 2021-10-18: 50 mg via INTRAVENOUS

## 2021-10-18 MED ORDER — FENTANYL CITRATE (PF) 250 MCG/5ML IJ SOLN
INTRAMUSCULAR | Status: DC | PRN
  Administered 2021-10-18 (×2): 100 ug via INTRAVENOUS
  Administered 2021-10-18: 50 ug via INTRAVENOUS

## 2021-10-18 MED ORDER — HYDROCODONE-ACETAMINOPHEN 7.5-325 MG PO TABS
ORAL_TABLET | ORAL | Status: AC
  Filled 2021-10-18: qty 1

## 2021-10-18 MED ORDER — BUPIVACAINE-EPINEPHRINE 0.5% -1:200000 IJ SOLN
INTRAMUSCULAR | Status: AC
  Filled 2021-10-18: qty 1

## 2021-10-18 MED ORDER — CHLORHEXIDINE GLUCONATE CLOTH 2 % EX PADS: 6.0000 | MEDICATED_PAD | Freq: Once | CUTANEOUS | Status: DC

## 2021-10-18 MED ORDER — 0.9 % SODIUM CHLORIDE (POUR BTL) OPTIME
TOPICAL | Status: DC | PRN
  Administered 2021-10-18: 1000 mL

## 2021-10-18 MED ORDER — ONDANSETRON HCL 4 MG/2ML IJ SOLN
INTRAMUSCULAR | Status: AC
  Filled 2021-10-18: qty 2

## 2021-10-18 MED ORDER — CHLORHEXIDINE GLUCONATE 0.12 % MT SOLN
15.0000 mL | Freq: Once | OROMUCOSAL | Status: AC
  Administered 2021-10-18: 15 mL via OROMUCOSAL
  Filled 2021-10-18: qty 15

## 2021-10-18 MED ORDER — GLYCOPYRROLATE PF 0.2 MG/ML IJ SOSY
PREFILLED_SYRINGE | INTRAMUSCULAR | Status: AC
  Filled 2021-10-18: qty 2

## 2021-10-18 MED ORDER — ONDANSETRON HCL 4 MG/2ML IJ SOLN
INTRAMUSCULAR | Status: AC
Start: 2021-10-18 — End: ?
  Filled 2021-10-18: qty 2

## 2021-10-18 SURGICAL SUPPLY — 49 items
BAG COUNTER SPONGE SURGICOUNT (BAG) ×2 IMPLANT
BLADE SURG 11 STRL SS (BLADE) ×2 IMPLANT
CNTNR URN SCR LID CUP LEK RST (MISCELLANEOUS) IMPLANT
CONT SPEC 4OZ STRL OR WHT (MISCELLANEOUS)
COVER BACK TABLE 60X90IN (DRAPES) ×2 IMPLANT
COVER MAYO STAND STRL (DRAPES) ×2 IMPLANT
DERMABOND ADVANCED (GAUZE/BANDAGES/DRESSINGS) ×1
DERMABOND ADVANCED .7 DNX12 (GAUZE/BANDAGES/DRESSINGS) IMPLANT
DIFFUSER DRILL AIR PNEUMATIC (MISCELLANEOUS) ×2 IMPLANT
DRAPE C-ARM 42X72 X-RAY (DRAPES) ×2 IMPLANT
DRAPE LAPAROTOMY 100X72X124 (DRAPES) ×2 IMPLANT
DRAPE SURG 17X23 STRL (DRAPES) ×2 IMPLANT
DRSG OPSITE POSTOP 3X4 (GAUZE/BANDAGES/DRESSINGS) ×1 IMPLANT
DRSG OPSITE POSTOP 4X6 (GAUZE/BANDAGES/DRESSINGS) ×1 IMPLANT
DURAPREP 26ML APPLICATOR (WOUND CARE) ×2 IMPLANT
ELECT REM PT RETURN 9FT ADLT (ELECTROSURGICAL) ×2
ELECTRODE REM PT RTRN 9FT ADLT (ELECTROSURGICAL) ×1 IMPLANT
GAUZE 4X4 16PLY ~~LOC~~+RFID DBL (SPONGE) IMPLANT
GAUZE SPONGE 4X4 12PLY STRL (GAUZE/BANDAGES/DRESSINGS) ×2 IMPLANT
GLOVE SRG 8 PF TXTR STRL LF DI (GLOVE) ×2 IMPLANT
GLOVE SURG SS PI 6.5 STRL IVOR (GLOVE) ×3 IMPLANT
GLOVE SURG SS PI 8.0 STRL IVOR (GLOVE) ×3 IMPLANT
GLOVE SURG UNDER POLY LF SZ8 (GLOVE) ×4
GLOVE SURG UNDER POLY LF SZ8.5 (GLOVE) ×2 IMPLANT
GOWN STRL REUS W/ TWL LRG LVL3 (GOWN DISPOSABLE) IMPLANT
GOWN STRL REUS W/ TWL XL LVL3 (GOWN DISPOSABLE) ×2 IMPLANT
GOWN STRL REUS W/TWL 2XL LVL3 (GOWN DISPOSABLE) IMPLANT
GOWN STRL REUS W/TWL LRG LVL3 (GOWN DISPOSABLE) ×4
GOWN STRL REUS W/TWL XL LVL3 (GOWN DISPOSABLE) ×4
KIT BASIN OR (CUSTOM PROCEDURE TRAY) ×2 IMPLANT
KIT POSITION SURG JACKSON T1 (MISCELLANEOUS) ×1 IMPLANT
KIT TURNOVER KIT B (KITS) ×2 IMPLANT
MARKER SKIN DUAL TIP RULER LAB (MISCELLANEOUS) ×3 IMPLANT
NDL HYPO 25X1 1.5 SAFETY (NEEDLE) ×1 IMPLANT
NDL SPNL 18GX3.5 QUINCKE PK (NEEDLE) ×1 IMPLANT
NEEDLE HYPO 25X1 1.5 SAFETY (NEEDLE) ×2 IMPLANT
NEEDLE SPNL 18GX3.5 QUINCKE PK (NEEDLE) ×2 IMPLANT
NS IRRIG 1000ML POUR BTL (IV SOLUTION) ×2 IMPLANT
PACK LAMINECTOMY NEURO (CUSTOM PROCEDURE TRAY) ×2 IMPLANT
PAD ARMBOARD 7.5X6 YLW CONV (MISCELLANEOUS) ×6 IMPLANT
SPONGE SURGIFOAM ABS GEL 12-7 (HEMOSTASIS) ×2 IMPLANT
SPONGE T-LAP 4X18 ~~LOC~~+RFID (SPONGE) IMPLANT
SUT VIC AB 0 CT1 27 (SUTURE) ×2
SUT VIC AB 0 CT1 27XBRD ANBCTR (SUTURE) IMPLANT
SUT VIC AB 2-0 CP2 18 (SUTURE) ×2 IMPLANT
SUT VIC AB 3-0 SH 8-18 (SUTURE) ×1 IMPLANT
TOWEL GREEN STERILE (TOWEL DISPOSABLE) ×2 IMPLANT
TOWEL GREEN STERILE FF (TOWEL DISPOSABLE) ×2 IMPLANT
WATER STERILE IRR 1000ML POUR (IV SOLUTION) ×2 IMPLANT

## 2021-10-18 NOTE — Transfer of Care (Signed)
Immediate Anesthesia Transfer of Care Note  Patient: Teresa Lee  Procedure(s) Performed: REMOVAL OF  SPINAL CORD STIMULATOR (Spine Lumbar)  Patient Location: PACU  Anesthesia Type:General  Level of Consciousness: awake, oriented and drowsy  Airway & Oxygen Therapy: Patient Spontanous Breathing and Patient connected to nasal cannula oxygen  Post-op Assessment: Report given to RN and Post -op Vital signs reviewed and stable  Post vital signs: Reviewed and stable  Last Vitals:  Vitals Value Taken Time  BP 130/90   Temp    Pulse 83   Resp 22   SpO2 96     Last Pain:  Vitals:   10/18/21 0913  PainSc: 0-No pain      Patients Stated Pain Goal: 0 (48/25/00 3704)  Complications: No notable events documented.

## 2021-10-18 NOTE — Op Note (Signed)
   Providing Compassionate, Quality Care - Together  Date of service: 10/18/2021  PREOP DIAGNOSIS:  Failure of spinal cord stimulator  POSTOP DIAGNOSIS: Same  PROCEDURE: Removal of dorsal column stimulator, leads and IPG  SURGEON: Dr. Pieter Partridge C. Symia Herdt, DO  ASSISTANT: Weston Brass, NP  ANESTHESIA: General Endotracheal  EBL: 15 cc  SPECIMENS: None  DRAINS: None   COMPLICATIONS: None  CONDITION: Hemodynamically stable  HISTORY: Teresa Lee is a 61 y.o. female with a history of a spinal cord stimulator placed in 2018, there was no longer getting benefit from this therefore she would like it removed.  We discussed all risks benefits and expected outcomes as well as alternatives to treatment.  Informed consent was obtained.  PROCEDURE IN DETAIL: The patient was brought to the operating room. After induction of general anesthesia, the patient was positioned on the operative table in the prone position. All pressure points were meticulously padded. Skin incision was then marked out and prepped and draped in the usual sterile fashion. Physician driven timeout was performed.  Using 15 blade, midline incision was made sharply over the midline where the percutaneous wires were placed and the anchors.  Using Bovie electrocautery, I sharply dissected around the leads identifying them both as well as disconnecting the anchors.  I then was able to gently pull both wires from the epidural space without difficulty whatsoever.  They appeared intact once removed.  I then used a 10 blade to open sharply the IPG incision.  Bovie electrocautery was used to circumferentially disconnect the IPG from the surrounding tissue.  This was gently removed from the pocket and the wires were then gently dissected and removed completely.  Hemostasis was achieved with bipolar cautery.  The wounds were irrigated copiously.  There were excellently hemostatic.  I then closed the wounds in layers, 2-0 and 3-0 Vicryl  sutures for dermis, skin was closed with skin glue and sterile dressing was applied.  At the end of the case all sponge, needle, and instrument counts were correct. The patient was then transferred to the stretcher, extubated, and taken to the post-anesthesia care unit in stable hemodynamic condition.

## 2021-10-18 NOTE — Anesthesia Procedure Notes (Addendum)
Date/Time: 10/18/2021 12:41 PM  Performed by: Anastasio Auerbach, CRNAPre-anesthesia Checklist: Patient identified, Emergency Drugs available, Suction available and Patient being monitored Patient Re-evaluated:Patient Re-evaluated prior to induction Oxygen Delivery Method: Circle system utilized Preoxygenation: Pre-oxygenation with 100% oxygen Induction Type: IV induction Ventilation: Oral airway inserted - appropriate to patient size and Mask ventilation without difficulty Laryngoscope Size: Glidescope and 3 Grade View: Grade I Tube size: 7.5 mm Number of attempts: 1 Airway Equipment and Method: Video-laryngoscopy and Rigid stylet Placement Confirmation: ETT inserted through vocal cords under direct vision and positive ETCO2 Secured at: 22 cm Dental Injury: Teeth and Oropharynx as per pre-operative assessment

## 2021-10-18 NOTE — H&P (Signed)
Providing Compassionate, Quality Care - Together  NEUROSURGERY HISTORY & PHYSICAL   Teresa Lee is an 61 y.o. female.   Chief Complaint: Failed spinal cord stimulator HPI: This is a 61 year old female with a history of an L4-5 fusion, spinal cord stimulator placed in 2018.  She states it is no longer working and therefore presents today for removal.  She has had it off for quite some time.  She does have a history of chronic low back pain and leg pain.  Her spinal cord stimulator initially did help but however over the past many months it is no longer been providing relief.  Past Medical History:  Diagnosis Date   Anxiety    Arthritis    osteoarthritis   Asthma    Blind left eye    Cancer ([place])    skin   Crohn's disease ([place])    CTS (carpal tunnel syndrome)    DJD (degenerative joint disease)    Both cervical spine and LS spine   Fibromyalgia    GERD (gastroesophageal reflux disease)    Heart murmur    Herniated nucleus pulposus    Hyperlipidemia    Hypertension    IBS (irritable bowel syndrome)    Leukocytosis    Myasthenia gravis    Myasthenia gravis ([place])    Myasthenia gravis ([place])    Pneumonia    Sleep apnea    Spinal cord stimulator status     Past Surgical History:  Procedure Laterality Date   BACK SURGERY     BIOPSY  05/24/2020   Procedure: BIOPSY;  Surgeon: Ronald Lobo, MD;  Location: [facility];  Service: Endoscopy;;   BREAST REDUCTION SURGERY     CARPAL TUNNEL RELEASE     bilateral   cervical disc inj     CHOLECYSTECTOMY     COLONOSCOPY WITH PROPOFOL N/A 05/24/2020   Procedure: COLONOSCOPY WITH PROPOFOL;  Surgeon: Ronald Lobo, MD;  Location: [facility];  Service: Endoscopy;  Laterality: N/A;   ENDOMETRIAL ABLATION W/ NOVASURE     ESOPHAGOGASTRODUODENOSCOPY (EGD) WITH PROPOFOL N/A 05/24/2020   Procedure: ESOPHAGOGASTRODUODENOSCOPY (EGD) WITH PROPOFOL;  Surgeon: Ronald Lobo, MD;  Location: [facility];  Service: Endoscopy;   Laterality: N/A;   LUMBAR LAMINECTOMY/DECOMPRESSION MICRODISCECTOMY Right 05/04/2020   Procedure: Right Lumbar Five Sacral One Microdiscectomy;  Surgeon: Erline Levine, MD;  Location: [facility];  Service: Neurosurgery;  Laterality: Right;  posterior   MELANOMA EXCISION     NASAL SINUS SURGERY     SPINAL CORD STIMULATOR INSERTION  2019   THYMECTOMY     due to myastenia gravis   THYMECTOMY     TRANSFORAMINAL LUMBAR INTERBODY FUSION (TLIF) WITH PEDICLE SCREW FIXATION 1 LEVEL Right 05/04/2020   Procedure: Right Lumbar Four-Five Transforaminal lumbar interbody fusion;  Surgeon: Erline Levine, MD;  Location: [facility];  Service: Neurosurgery;  Laterality: Right;  posterior    Family History  Problem Relation Age of Onset   Asthma Daughter    Heart disease Mother    Cancer Mother    Hyperlipidemia Mother    Hypertension Mother    Diabetes Father    Social History:  reports that she has never smoked. She has never used smokeless tobacco. She reports current alcohol use. She reports that she does not use drugs.  Allergies:  Allergies  Allergen Reactions   Duloxetine Nausea Only    Cymbalta    Latex Other (See Comments)    BLISTER   Lorazepam Other (See Comments)  Hallucinations   Orange Concentrate [Flavoring Agent] Other (See Comments)    Acid reflux   Other Hives, Nausea Only and Other (See Comments)    Some nuts cause a rash   Percocet [Oxycodone-Acetaminophen] Hives   Sulfa Antibiotics Hives   Bioflavonoids Rash    Causes burning of skin and blisters   Cefixime Other (See Comments)    Due to myasthenia gravis   Nucynta [Tapentadol] Other (See Comments)    Headache     Medications Prior to Admission  Medication Sig Dispense Refill   albuterol (PROVENTIL) (2.5 MG/3ML) 0.083% nebulizer solution Take 2.5 mg by nebulization every 6 (six) hours as needed for shortness of breath.     albuterol (VENTOLIN HFA) 108 (90 Base) MCG/ACT inhaler 1 PUFF EVERY 6 HOURS AS NEEDED FOR WHEEZING  (Patient taking differently: Inhale 1 puff into the lungs every 6 (six) hours as needed for wheezing or shortness of breath.) 18 g 2   cyanocobalamin (,VITAMIN B-12,) 1000 MCG/ML injection Inject 1,000 mcg into the muscle See admin instructions. Every 6-8 weeks     desmopressin (DDAVP) 0.2 MG tablet Take 600 mcg by mouth at bedtime.     estradiol (ESTRACE) 2 MG tablet Take 2 mg by mouth at bedtime.     HYDROcodone-acetaminophen (NORCO) 10-325 MG tablet Take 1 tablet by mouth 5 (five) times daily as needed for moderate pain.     losartan (COZAAR) 50 MG tablet Take 50 mg by mouth daily.     meloxicam (MOBIC) 15 MG tablet Take 15 mg by mouth daily.     Mesalamine (ASACOL) 400 MG CPDR DR capsule Take 800 mg by mouth 2 (two) times daily.     methocarbamol (ROBAXIN) 750 MG tablet Take 750 mg by mouth at bedtime.     metoprolol succinate (TOPROL-XL) 25 MG 24 hr tablet Take 25 mg by mouth at bedtime.     mycophenolate (CELLCEPT) 500 MG tablet Take 1,000 mg by mouth 2 (two) times daily.      naloxone (NARCAN) nasal spray 4 mg/0.1 mL Place 1 spray into the nose once as needed (opioid overdose).     ondansetron (ZOFRAN) 4 MG tablet Take 4 mg by mouth every 8 (eight) hours as needed for nausea or vomiting.     pantoprazole (PROTONIX) 40 MG tablet TAKE 1 TABLET DAILY (Patient taking differently: Take 40 mg by mouth 2 (two) times daily.) 90 tablet 3   potassium chloride SA (KLOR-CON M) 20 MEQ tablet Take 20 mEq by mouth 2 (two) times daily.     predniSONE (DELTASONE) 10 MG tablet Take 10 mg by mouth every morning.     pregabalin (LYRICA) 50 MG capsule Take 50 mg by mouth at bedtime.     progesterone (PROMETRIUM) 100 MG capsule Take 100 mg by mouth at bedtime.     rOPINIRole (REQUIP) 1 MG tablet Take 1-2 tablets (1-2 mg total) by mouth at bedtime. (Patient taking differently: Take 1 mg by mouth See admin instructions. Take 1 tablet (1 mg) by mouth daily at bedtime, may take a 2nd tablet (1 mg) later if still  needed for restless legs) 180 tablet 1   rosuvastatin (CRESTOR) 40 MG tablet Take 1 tablet (40 mg total) by mouth daily. 90 tablet 2   Vitamin D, Ergocalciferol, (DRISDOL) 1.25 MG (50000 UNIT) CAPS capsule Take 50,000 Units by mouth every 14 (fourteen) days.     loperamide (IMODIUM) 2 MG capsule Take 2 capsules (4 mg total) by mouth as needed  for diarrhea or loose stools. 30 capsule 0   Spacer/Aero-Holding Chambers (AEROCHAMBER PLUS) inhaler Use as instructed to use with inahaler. 1 each 1   SYMBICORT 80-4.5 MCG/ACT inhaler Inhale 2 puffs into the lungs 2 (two) times daily. (Patient taking differently: Inhale 2 puffs into the lungs 2 (two) times daily as needed (shortness of breath).) 10.2 g 6    Results for orders placed or performed during the hospital encounter of 10/18/21 (from the past 48 hour(s))  Surgical pcr screen     Status: None   Collection Time: 10/18/21  9:29 AM   Specimen: Nasal Mucosa; Nasal Swab  Result Value Ref Range   MRSA, PCR NEGATIVE NEGATIVE   Staphylococcus aureus NEGATIVE NEGATIVE    Comment: (NOTE) The Xpert SA Assay (FDA approved for NASAL specimens in patients 65 years of age and older), is one component of a comprehensive surveillance program. It is not intended to diagnose infection nor to guide or monitor treatment. Performed at [facility], [place] [address] [place], [place] [address]    No results found.  ROS All pertinent positives and negatives are listed in HPI above  Blood pressure (!) 146/86, pulse 94, temperature 98 F (36.7 C), resp. rate 18, height 5' (1.524 m), weight 71.7 kg, SpO2 98 %. Physical Exam  Awake alert oriented, no acute distress PERRLA Cranial nerves II through XII intact Speech fluent and appropriate Moves all extremities symmetrically Sensory intact light touch throughout  Assessment/Plan 61 year old female with  Failed spinal cord stimulator  -OR today for removal of spinal cord stimulator.  We discussed the  possibility of needing to do a laminectomy in order to remove stimulator if there is adhesions and scarring.  We discussed all risks, benefits and expected outcomes.  Informed consent was obtained.  I answered all of her questions.  Thank you for allowing me to participate in this patient's care.  Please do not hesitate to call with questions or concerns.   Elwin Sleight, [facility] Cell: [phone]

## 2021-10-19 ENCOUNTER — Encounter (HOSPITAL_COMMUNITY): Payer: Self-pay | Admitting: Neurological Surgery

## 2021-10-19 NOTE — Anesthesia Postprocedure Evaluation (Signed)
Anesthesia Post Note  Patient: Teresa Lee  Procedure(s) Performed: REMOVAL OF  SPINAL CORD STIMULATOR (Spine Lumbar)     Patient location during evaluation: PACU Anesthesia Type: General Level of consciousness: awake and alert Pain management: pain level controlled Vital Signs Assessment: post-procedure vital signs reviewed and stable Respiratory status: spontaneous breathing, nonlabored ventilation, respiratory function stable and patient connected to nasal cannula oxygen Cardiovascular status: blood pressure returned to baseline and stable Postop Assessment: no apparent nausea or vomiting Anesthetic complications: no   No notable events documented.  Last Vitals:  Vitals:   10/18/21 1420 10/18/21 1435  BP: (!) 169/87 (!) 164/88  Pulse: 95 95  Resp: 14 17  Temp:  37.1 C  SpO2: 96% 95%    Last Pain:  Vitals:   10/18/21 1435  PainSc: 3                  Haisley Arens

## 2021-12-16 ENCOUNTER — Ambulatory Visit
Admission: RE | Admit: 2021-12-16 | Discharge: 2021-12-16 | Disposition: A | Discharge status: Home / Self Care | Payer: Medicare HMO | Source: Ambulatory Visit | Attending: Nurse Practitioner | Admitting: Nurse Practitioner

## 2021-12-16 ENCOUNTER — Other Ambulatory Visit: Payer: Self-pay | Admitting: Nurse Practitioner

## 2021-12-16 DIAGNOSIS — M79672 Pain in left foot: Secondary | ICD-10-CM

## 2021-12-25 IMAGING — CT CT CERVICAL SPINE W/O CM
3 of 4 series · 13 of 33 positions shown, 16 images · non-contrast
Comparison: CT 09/06/2017

CLINICAL DATA: Fell and hit head abrasion and bruising

EXAM:
CT HEAD WITHOUT CONTRAST
CT CERVICAL SPINE WITHOUT CONTRAST
TECHNIQUE: Multidetector CT imaging of the head and cervical spine was
performed following the standard protocol without intravenous
contrast. Multiplanar CT image reconstructions of the cervical spine
were also generated.

[Series 5: sagittal bone · sagittal · 0.23mm/px · 5 of 61 slices shown, 6 images]
[im 21/61  bone]
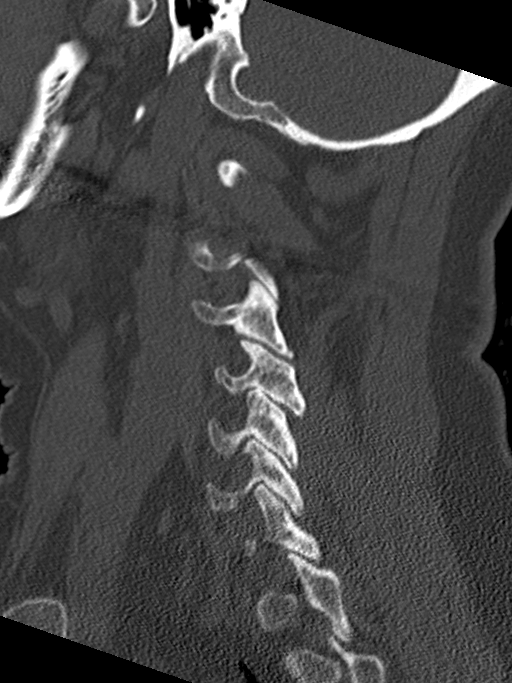
[im 26/61  bone]
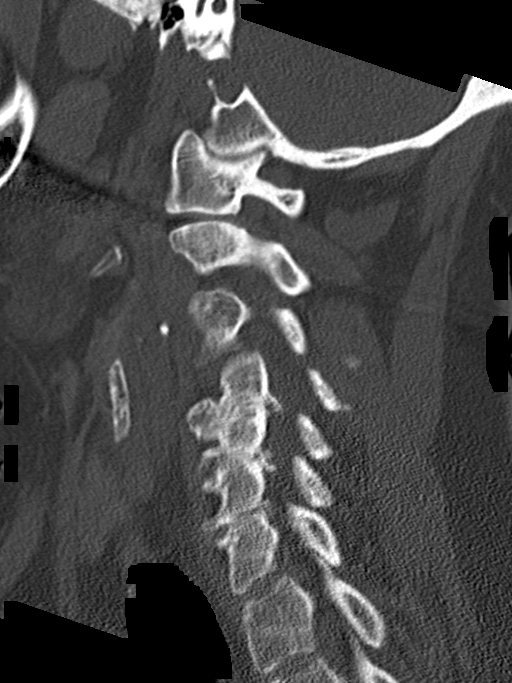
[im 31/61  soft-tissue]
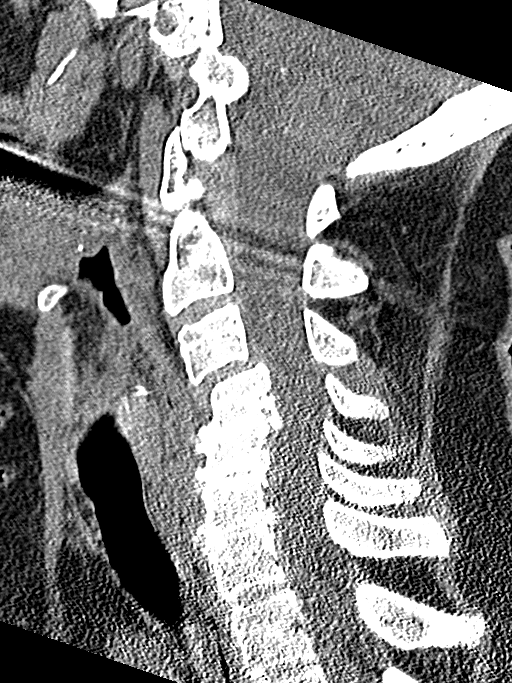
[im 31/61  bone]
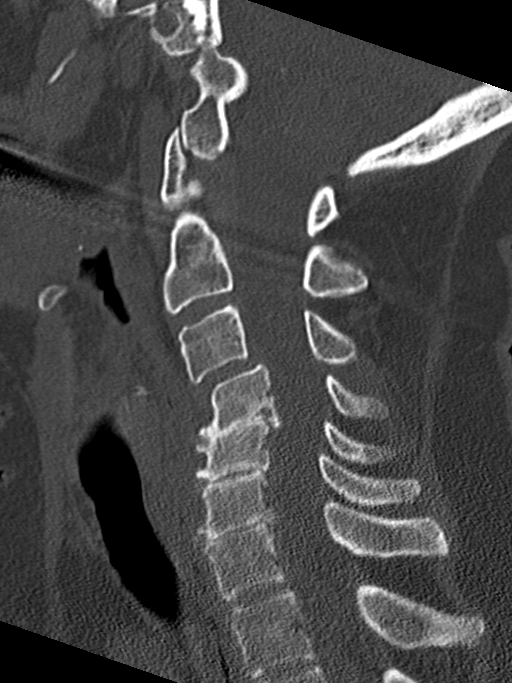
[im 36/61  bone]
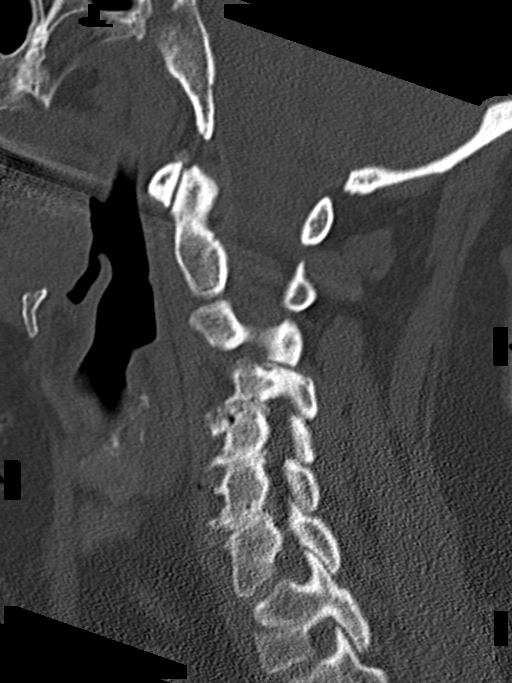
[im 41/61  bone]
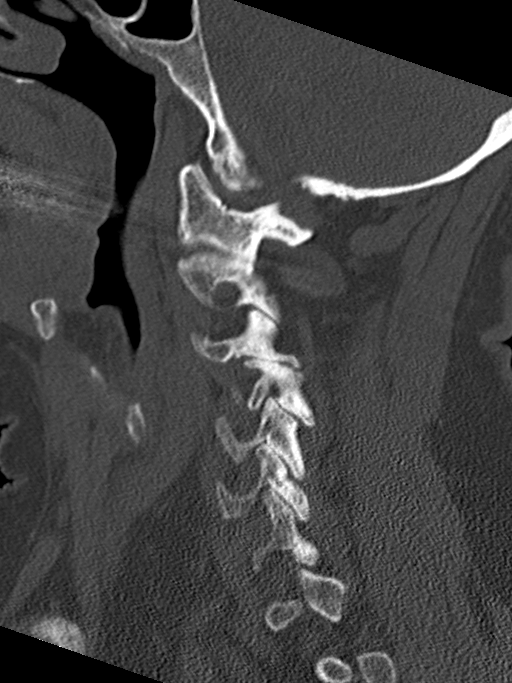

[Series 6: coronal bone · coronal · 0.23mm/px · 3 of 61 slices shown]
[im 13/61  bone]
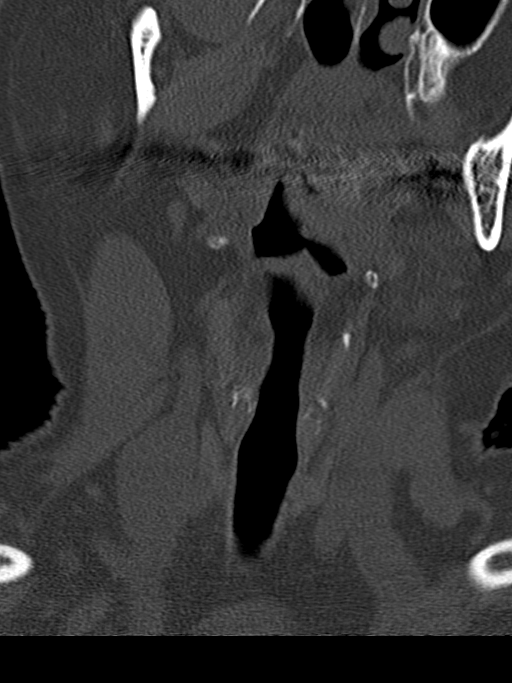
[im 25/61  bone]
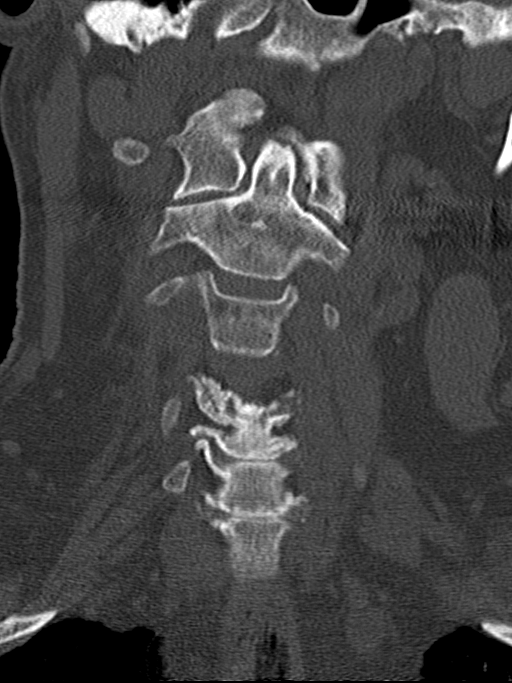
[im 37/61  bone]
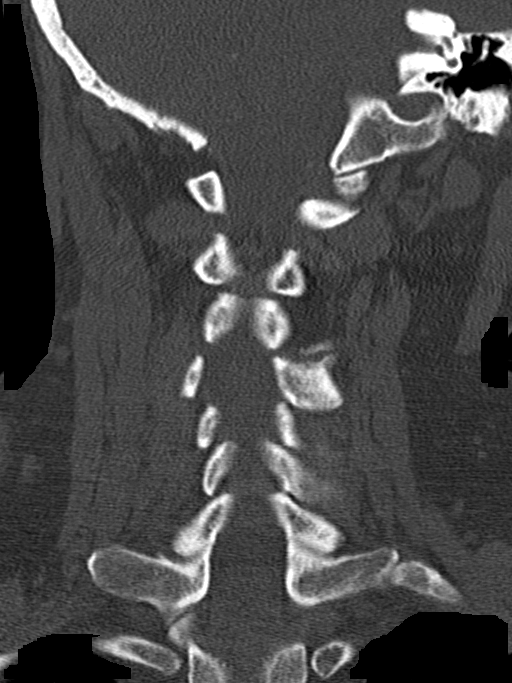

[Series 7: orthogonal axials · axial · 0.21mm/px · z∈[+1257,+1355]mm · 5 of 80 slices shown, 7 images]
[im 14/80  soft-tissue]
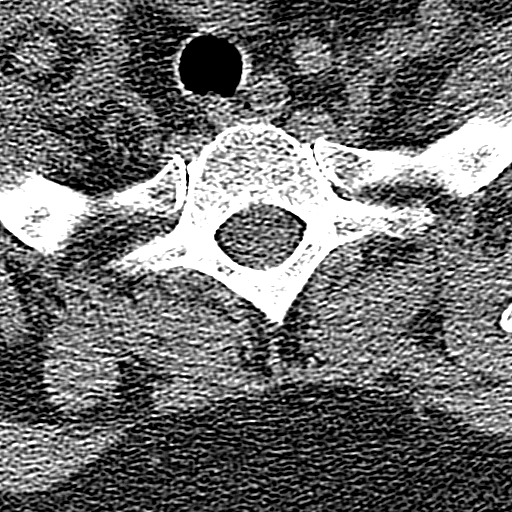
[im 14/80  bone]
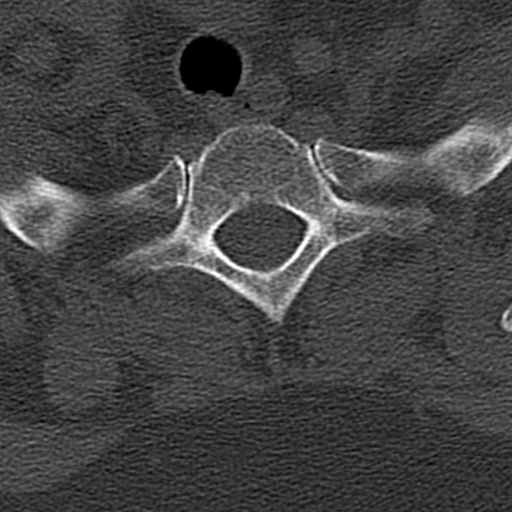
[im 27/80  bone]
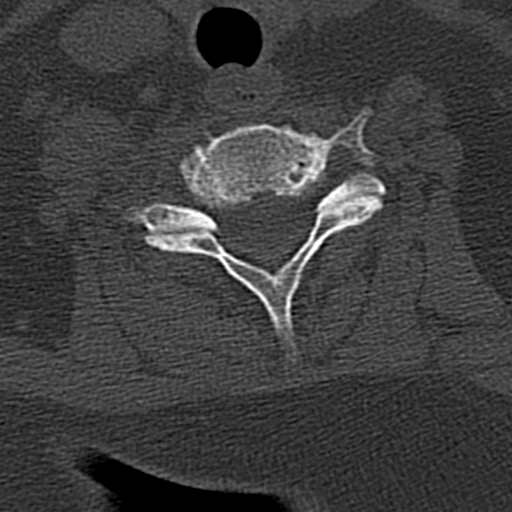
[im 40/80  bone]
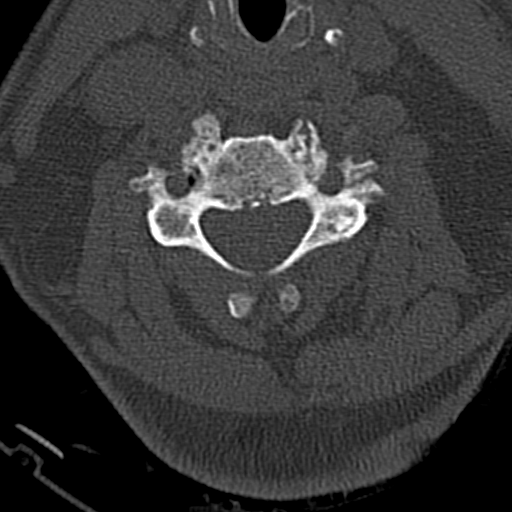
[im 53/80  bone]
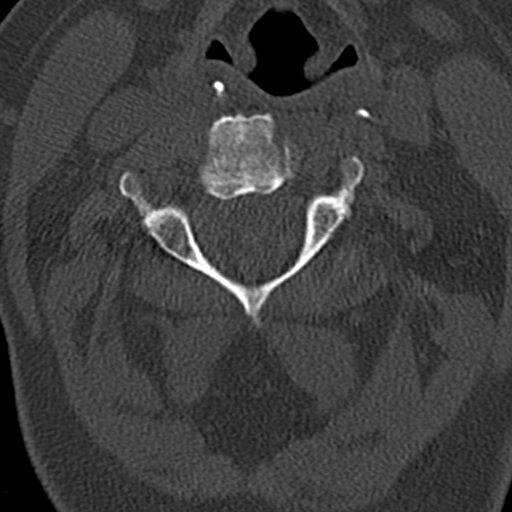
[im 66/80  soft-tissue]
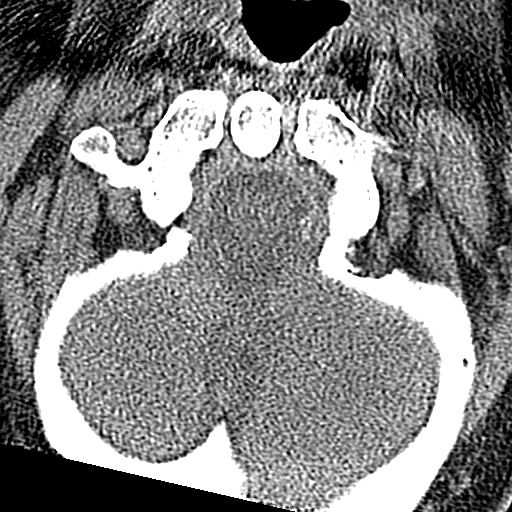
[im 66/80  bone]
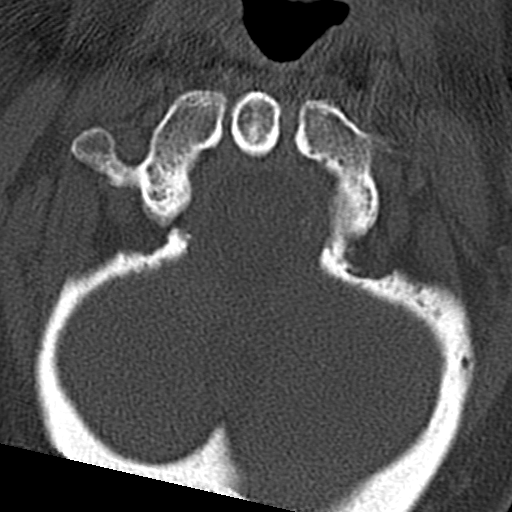

[13 of 33 positions shown; findings below may reference images not displayed]

FINDINGS: CT HEAD FINDINGS

Brain: No acute territorial infarction, hemorrhage or intracranial
mass is visualized. Absence of the septum pellucidum. Features
suggestive of possible closed lip schizencephaly at the right
frontal lobe with mild eccentric dilatation of right frontal horn of
ventricle, and possible cleft, coronal series 4, image number 24.
Stable ventricle size and morphology.

Vascular: No hyperdense vessels.  Carotid vascular calcification

Skull: Normal. Negative for fracture or focal lesion.

Sinuses/Orbits: No acute finding.

Other: None

CT CERVICAL SPINE FINDINGS

Alignment: Reversal of cervical lordosis. 3 mm anterolisthesis C3 on
C4. Similar trace retrolisthesis C4 on C5. Facet alignment is
maintained

Skull base and vertebrae: No acute fracture. No primary bone lesion
or focal pathologic process.

Soft tissues and spinal canal: No prevertebral fluid or swelling. No
visible canal hematoma.

Disc levels: Advanced degenerative changes C4-C5, C5-C6 and C6-C7
with disc space narrowing and osteophyte. Facet degenerative changes
at multiple levels.

Upper chest: Negative.

Other: None
IMPRESSION: 1. No CT evidence for acute intracranial abnormality.
2. Absence of the septum pellucidum with suspected closed lip
schizencephaly at the right frontal lobe.
3. Reversal of cervical lordosis with advanced degenerative changes.
No fracture seen. Slight increased anterolisthesis C3 on C4 likely
due to posterior facet degenerative change which has progressed.

## 2021-12-25 IMAGING — CT CT L SPINE W/O CM
3 series · 10 of 35 positions shown, 11 images · non-contrast
Comparison: Prior radiograph from 05/04/2020.

CLINICAL DATA: Initial evaluation for acute trauma, fall, recent
surgery.

EXAM:
CT LUMBAR SPINE WITHOUT CONTRAST
TECHNIQUE: Multidetector CT imaging of the lumbar spine was performed without
intravenous contrast administration. Multiplanar CT image
reconstructions were also generated.

[Series 8: lspine tissue · axial · 0.33mm/px · z∈[+914,+1062]mm · 2 of 161 slices shown, 3 images]
[im 50/161  soft-tissue]
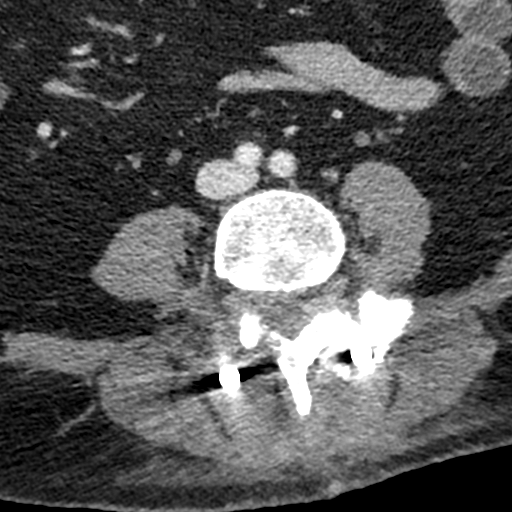
[im 50/161  bone]
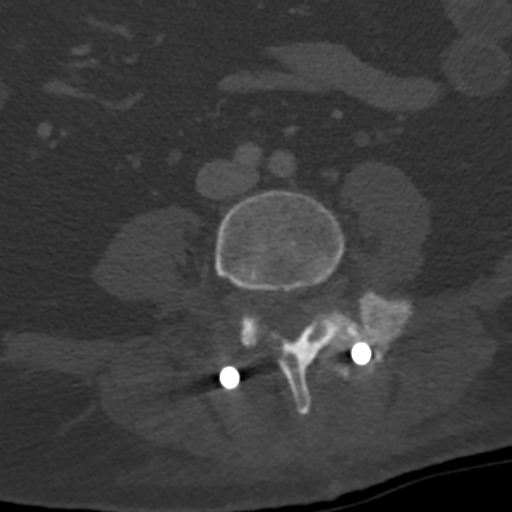
[im 124/161  bone]
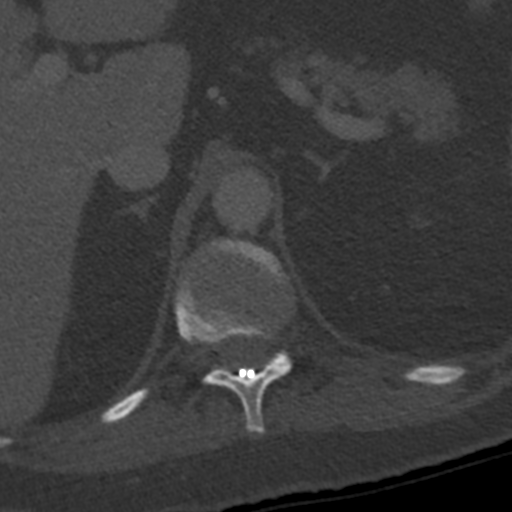

[Series 10: coronal spine tissue · coronal · 0.34mm/px · 3 of 68 slices shown]
[im 14/68  bone]
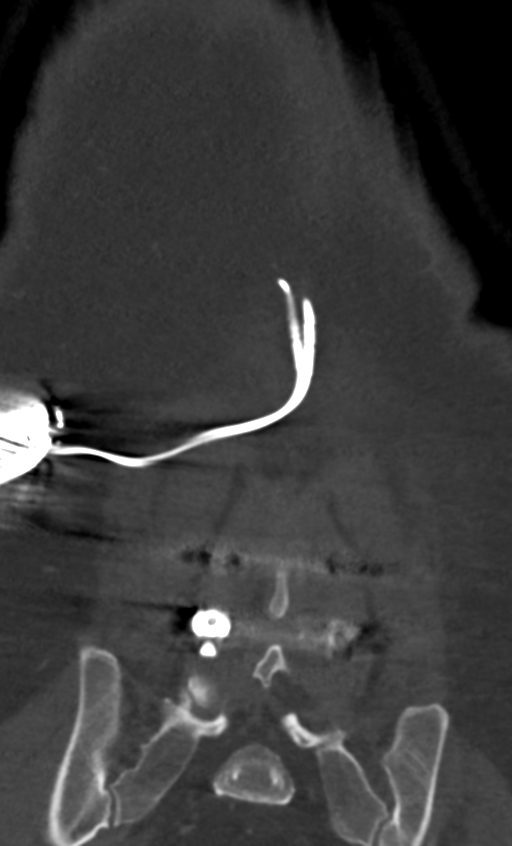
[im 27/68  bone]
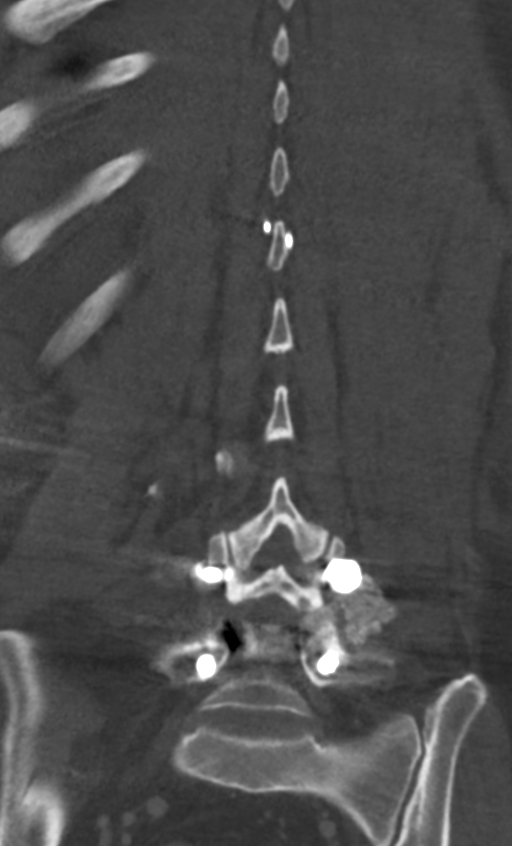
[im 41/68  bone]
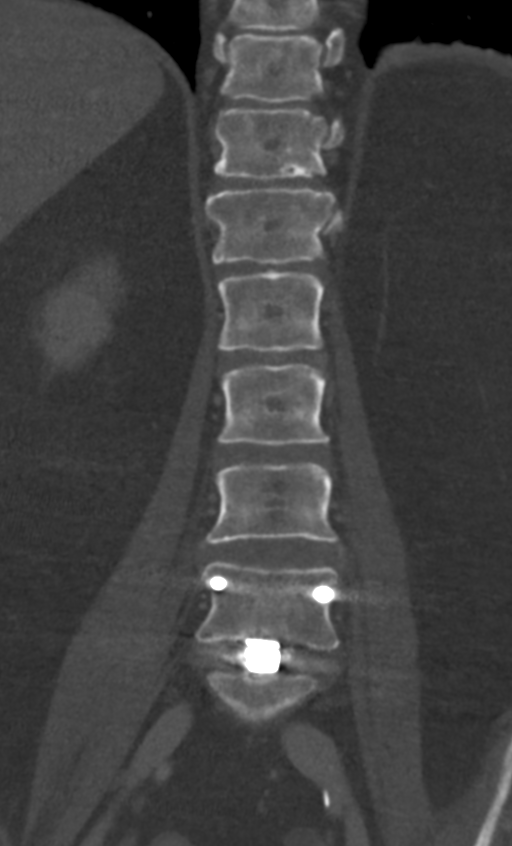

[Series 11: sagittal spne tissue · sagittal · 0.29mm/px · 5 of 74 slices shown]
[im 25/74  bone]
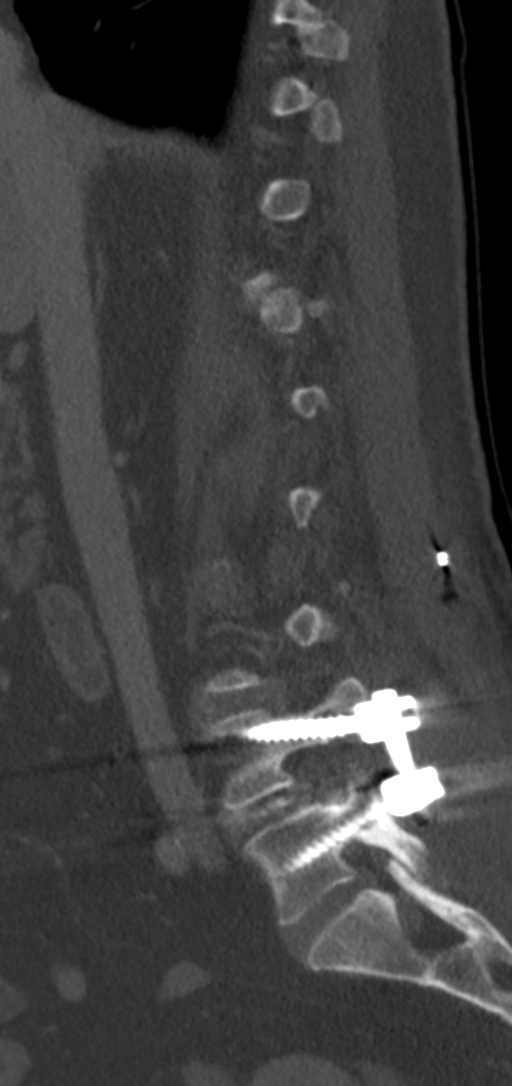
[im 31/74  bone]
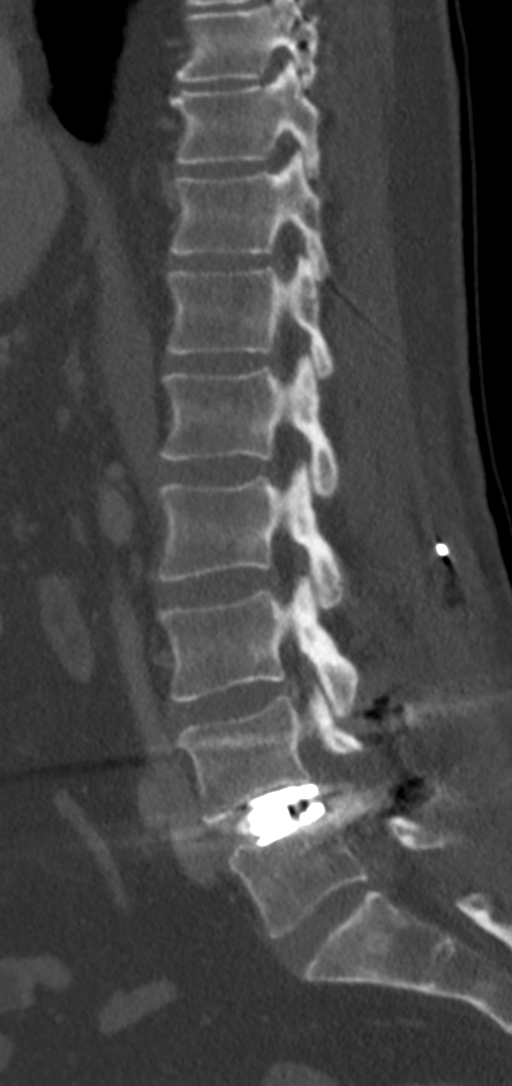
[im 37/74  bone]
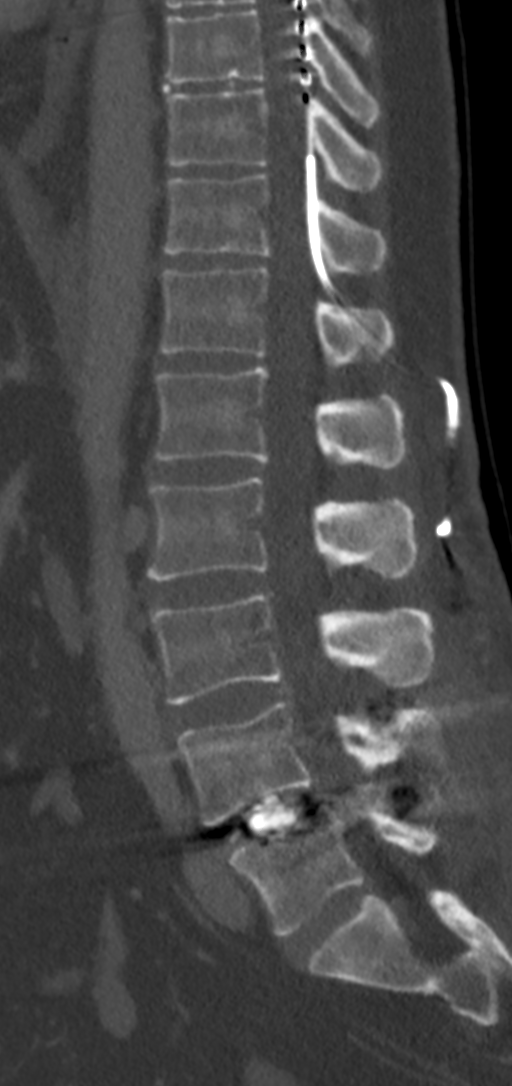
[im 43/74  bone]
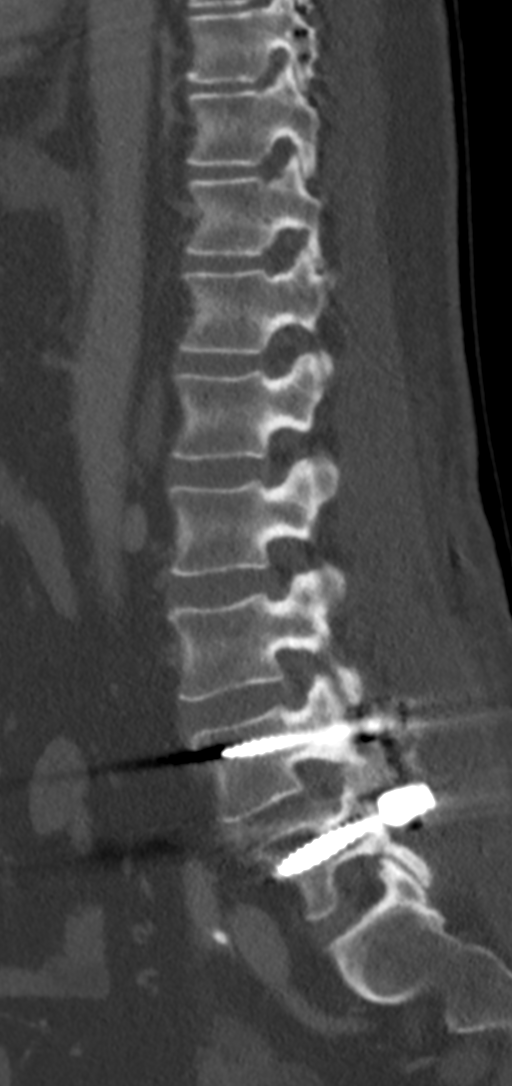
[im 49/74  bone]
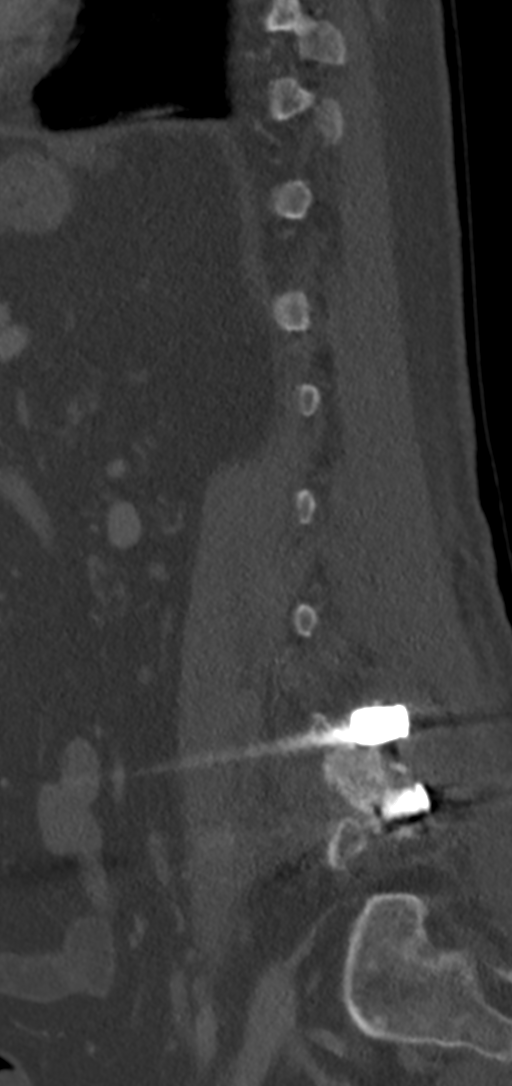

[10 of 35 positions shown; findings below may reference images not displayed]

FINDINGS: Segmentation: Standard. Lowest well-formed disc space labeled the
L5-S1 level.

Alignment: Trace 2 mm anterolisthesis of L4 on L5. Alignment
otherwise normal preservation of the normal lumbar lordosis.

Vertebrae: Vertebral body height maintained without acute or chronic
fracture. Visualized sacrum and pelvis intact. SI joints
approximated symmetric. No discrete or worrisome osseous lesions.
Sequelae of recent PLIF at L4-5. Hardware is intact and
well-positioned. No periprosthetic lucency to suggest lucency or
failure.

Paraspinal and other soft tissues: Normal expected postoperative
changes present within the posterior paraspinous soft tissues.
Paraspinous soft tissues demonstrate no other acute finding.
Generator for spinal stimulator device present within the
subcutaneous fat of the right flank. Electrodes seen coursing
cephalad within the dorsal epidural space, entering the spinal canal
at the level of T11-12. Visualized visceral structures within normal
limits. Retro Dick left renal vein noted. Mild aortic
atherosclerosis.

Disc levels:

L1-2:  Unremarkable.

L2-3: Small biforaminal disc protrusions, slightly larger on the
right. No spinal stenosis. Foramina remain patent.

L3-4: Minimal disc bulge, eccentric to the right. Superimposed right
foraminal to extraforaminal disc protrusion with annular
calcification (series 8, image 100). Mild facet hypertrophy. No
significant spinal stenosis. Mild to moderate right with mild left
L3 foraminal stenosis.

L4-5: Recent PLIF. No appreciable residual spinal stenosis. Foramina
appear grossly patent.

L5-S1: Broad-based central to right subarticular disc protrusion
with slight caudad angulation. Minimal facet hypertrophy. No
significant spinal stenosis. Foramina remain patent.
IMPRESSION: 1. No acute traumatic injury within the lumbar spine.
2. Sequelae of recent PLIF at L4-5 without complication. No
significant residual spinal stenosis. Foramina remain patent at this
time.
3. Broad-based central to right subarticular disc protrusion at
L5-S1, closely approximating the descending right S1 nerve root,
similar to previous.
4. Right foraminal to extraforaminal disc protrusion at L3-4,
potentially irritating the exiting right L3 nerve root.

## 2022-01-30 NOTE — Progress Notes (Signed)
Appointment cancelled - please disregard.

## 2022-07-10 ENCOUNTER — Emergency Department (HOSPITAL_BASED_OUTPATIENT_CLINIC_OR_DEPARTMENT_OTHER): Payer: Medicare HMO

## 2022-07-10 ENCOUNTER — Inpatient Hospital Stay (HOSPITAL_BASED_OUTPATIENT_CLINIC_OR_DEPARTMENT_OTHER)
Admission: EM | Admit: 2022-07-10 | Discharge: 2022-07-19 | DRG: 500 | Disposition: A | Discharge status: Home Health | Payer: Medicare HMO | Attending: Internal Medicine | Admitting: Internal Medicine

## 2022-07-10 ENCOUNTER — Encounter (HOSPITAL_BASED_OUTPATIENT_CLINIC_OR_DEPARTMENT_OTHER): Payer: Self-pay | Admitting: Urology

## 2022-07-10 ENCOUNTER — Other Ambulatory Visit: Payer: Self-pay

## 2022-07-10 DIAGNOSIS — T380X5A Adverse effect of glucocorticoids and synthetic analogues, initial encounter: Secondary | ICD-10-CM | POA: Diagnosis not present

## 2022-07-10 DIAGNOSIS — N179 Acute kidney failure, unspecified: Secondary | ICD-10-CM | POA: Diagnosis present

## 2022-07-10 DIAGNOSIS — I10 Essential (primary) hypertension: Secondary | ICD-10-CM | POA: Diagnosis present

## 2022-07-10 DIAGNOSIS — G4730 Sleep apnea, unspecified: Secondary | ICD-10-CM | POA: Diagnosis present

## 2022-07-10 DIAGNOSIS — J810 Acute pulmonary edema: Secondary | ICD-10-CM | POA: Diagnosis not present

## 2022-07-10 DIAGNOSIS — Z8249 Family history of ischemic heart disease and other diseases of the circulatory system: Secondary | ICD-10-CM | POA: Diagnosis not present

## 2022-07-10 DIAGNOSIS — D649 Anemia, unspecified: Secondary | ICD-10-CM | POA: Diagnosis not present

## 2022-07-10 DIAGNOSIS — Z981 Arthrodesis status: Secondary | ICD-10-CM

## 2022-07-10 DIAGNOSIS — E8721 Acute metabolic acidosis: Secondary | ICD-10-CM | POA: Diagnosis present

## 2022-07-10 DIAGNOSIS — Z825 Family history of asthma and other chronic lower respiratory diseases: Secondary | ICD-10-CM

## 2022-07-10 DIAGNOSIS — E781 Pure hyperglyceridemia: Secondary | ICD-10-CM | POA: Diagnosis present

## 2022-07-10 DIAGNOSIS — E119 Type 2 diabetes mellitus without complications: Secondary | ICD-10-CM

## 2022-07-10 DIAGNOSIS — Z6831 Body mass index (BMI) 31.0-31.9, adult: Secondary | ICD-10-CM

## 2022-07-10 DIAGNOSIS — J45909 Unspecified asthma, uncomplicated: Secondary | ICD-10-CM | POA: Diagnosis present

## 2022-07-10 DIAGNOSIS — M1611 Unilateral primary osteoarthritis, right hip: Secondary | ICD-10-CM | POA: Diagnosis present

## 2022-07-10 DIAGNOSIS — E860 Dehydration: Secondary | ICD-10-CM | POA: Diagnosis present

## 2022-07-10 DIAGNOSIS — Z791 Long term (current) use of non-steroidal anti-inflammatories (NSAID): Secondary | ICD-10-CM

## 2022-07-10 DIAGNOSIS — F419 Anxiety disorder, unspecified: Secondary | ICD-10-CM | POA: Diagnosis present

## 2022-07-10 DIAGNOSIS — K219 Gastro-esophageal reflux disease without esophagitis: Secondary | ICD-10-CM | POA: Diagnosis present

## 2022-07-10 DIAGNOSIS — Z886 Allergy status to analgesic agent status: Secondary | ICD-10-CM | POA: Diagnosis not present

## 2022-07-10 DIAGNOSIS — Z85820 Personal history of malignant melanoma of skin: Secondary | ICD-10-CM

## 2022-07-10 DIAGNOSIS — M609 Myositis, unspecified: Secondary | ICD-10-CM | POA: Diagnosis not present

## 2022-07-10 DIAGNOSIS — M6019 Interstitial myositis, multiple sites: Secondary | ICD-10-CM | POA: Diagnosis not present

## 2022-07-10 DIAGNOSIS — Z7984 Long term (current) use of oral hypoglycemic drugs: Secondary | ICD-10-CM | POA: Diagnosis not present

## 2022-07-10 DIAGNOSIS — Z9989 Dependence on other enabling machines and devices: Secondary | ICD-10-CM | POA: Diagnosis not present

## 2022-07-10 DIAGNOSIS — Z7951 Long term (current) use of inhaled steroids: Secondary | ICD-10-CM

## 2022-07-10 DIAGNOSIS — Z833 Family history of diabetes mellitus: Secondary | ICD-10-CM

## 2022-07-10 DIAGNOSIS — M797 Fibromyalgia: Secondary | ICD-10-CM | POA: Diagnosis present

## 2022-07-10 DIAGNOSIS — H5462 Unqualified visual loss, left eye, normal vision right eye: Secondary | ICD-10-CM | POA: Diagnosis present

## 2022-07-10 DIAGNOSIS — G4733 Obstructive sleep apnea (adult) (pediatric): Secondary | ICD-10-CM | POA: Diagnosis not present

## 2022-07-10 DIAGNOSIS — G7000 Myasthenia gravis without (acute) exacerbation: Secondary | ICD-10-CM | POA: Diagnosis present

## 2022-07-10 DIAGNOSIS — K5090 Crohn's disease, unspecified, without complications: Secondary | ICD-10-CM | POA: Diagnosis present

## 2022-07-10 DIAGNOSIS — Z79899 Other long term (current) drug therapy: Secondary | ICD-10-CM

## 2022-07-10 DIAGNOSIS — E1165 Type 2 diabetes mellitus with hyperglycemia: Secondary | ICD-10-CM | POA: Diagnosis not present

## 2022-07-10 DIAGNOSIS — Z91040 Latex allergy status: Secondary | ICD-10-CM

## 2022-07-10 DIAGNOSIS — Z1152 Encounter for screening for COVID-19: Secondary | ICD-10-CM

## 2022-07-10 DIAGNOSIS — Z79624 Long term (current) use of inhibitors of nucleotide synthesis: Secondary | ICD-10-CM

## 2022-07-10 DIAGNOSIS — E876 Hypokalemia: Secondary | ICD-10-CM | POA: Diagnosis not present

## 2022-07-10 DIAGNOSIS — E669 Obesity, unspecified: Secondary | ICD-10-CM | POA: Diagnosis present

## 2022-07-10 DIAGNOSIS — M6282 Rhabdomyolysis: Secondary | ICD-10-CM | POA: Diagnosis not present

## 2022-07-10 DIAGNOSIS — Z888 Allergy status to other drugs, medicaments and biological substances status: Secondary | ICD-10-CM

## 2022-07-10 DIAGNOSIS — Z83438 Family history of other disorder of lipoprotein metabolism and other lipidemia: Secondary | ICD-10-CM

## 2022-07-10 DIAGNOSIS — Z882 Allergy status to sulfonamides status: Secondary | ICD-10-CM

## 2022-07-10 DIAGNOSIS — Z885 Allergy status to narcotic agent status: Secondary | ICD-10-CM

## 2022-07-10 LAB — COMPREHENSIVE METABOLIC PANEL
ALT: 223 U/L — ABNORMAL HIGH (ref 0–44)
AST: 202 U/L — ABNORMAL HIGH (ref 15–41)
Albumin: 3.3 g/dL — ABNORMAL LOW (ref 3.5–5.0)
Alkaline Phosphatase: 115 U/L (ref 38–126)
Anion gap: 12 (ref 5–15)
BUN: 55 mg/dL — ABNORMAL HIGH (ref 6–20)
CO2: 15 mmol/L — ABNORMAL LOW (ref 22–32)
Calcium: 8.9 mg/dL (ref 8.9–10.3)
Chloride: 108 mmol/L (ref 98–111)
Creatinine, Ser: 2.76 mg/dL — ABNORMAL HIGH (ref 0.44–1.00)
GFR, Estimated: 19 mL/min — ABNORMAL LOW (ref >60)
Glucose, Bld: 180 mg/dL — ABNORMAL HIGH (ref 70–99)
Potassium: 3.5 mmol/L (ref 3.5–5.1)
Sodium: 135 mmol/L (ref 135–145)
Total Bilirubin: 0.7 mg/dL (ref 0.3–1.2)
Total Protein: 7 g/dL (ref 6.5–8.1)

## 2022-07-10 LAB — CBC WITH DIFFERENTIAL/PLATELET
Abs Immature Granulocytes: 0.95 10*3/uL — ABNORMAL HIGH (ref 0.00–0.07)
Basophils Absolute: 0.2 10*3/uL — ABNORMAL HIGH (ref 0.0–0.1)
Basophils Relative: 1 %
Eosinophils Absolute: 0.2 10*3/uL (ref 0.0–0.5)
Eosinophils Relative: 1 %
HCT: 34.9 % — ABNORMAL LOW (ref 36.0–46.0)
Hemoglobin: 11.8 g/dL — ABNORMAL LOW (ref 12.0–15.0)
Immature Granulocytes: 5 %
Lymphocytes Relative: 10 %
Lymphs Abs: 1.7 10*3/uL (ref 0.7–4.0)
MCH: 31.6 pg (ref 26.0–34.0)
MCHC: 33.8 g/dL (ref 30.0–36.0)
MCV: 93.6 fL (ref 80.0–100.0)
Monocytes Absolute: 1.4 10*3/uL — ABNORMAL HIGH (ref 0.1–1.0)
Monocytes Relative: 8 %
Neutro Abs: 13.5 10*3/uL — ABNORMAL HIGH (ref 1.7–7.7)
Neutrophils Relative %: 75 %
Platelets: 227 10*3/uL (ref 150–400)
RBC: 3.73 MIL/uL — ABNORMAL LOW (ref 3.87–5.11)
RDW: 13.7 % (ref 11.5–15.5)
WBC: 17.9 10*3/uL — ABNORMAL HIGH (ref 4.0–10.5)
nRBC: 0 % (ref 0.0–0.2)

## 2022-07-10 LAB — CK: Total CK: 8200 U/L — ABNORMAL HIGH (ref 38–234)

## 2022-07-10 LAB — CBC
HCT: 32.8 % — ABNORMAL LOW (ref 36.0–46.0)
Hemoglobin: 10.6 g/dL — ABNORMAL LOW (ref 12.0–15.0)
MCH: 31.8 pg (ref 26.0–34.0)
MCHC: 32.3 g/dL (ref 30.0–36.0)
MCV: 98.5 fL (ref 80.0–100.0)
Platelets: 180 10*3/uL (ref 150–400)
RBC: 3.33 MIL/uL — ABNORMAL LOW (ref 3.87–5.11)
RDW: 13.6 % (ref 11.5–15.5)
WBC: 13.9 10*3/uL — ABNORMAL HIGH (ref 4.0–10.5)
nRBC: 0 % (ref 0.0–0.2)

## 2022-07-10 LAB — URINALYSIS, ROUTINE W REFLEX MICROSCOPIC
Bilirubin Urine: NEGATIVE
Glucose, UA: NEGATIVE mg/dL
Ketones, ur: NEGATIVE mg/dL
Leukocytes,Ua: NEGATIVE
Nitrite: NEGATIVE
Protein, ur: >=300 mg/dL — AB
Specific Gravity, Urine: 1.025 (ref 1.005–1.030)
pH: 5 (ref 5.0–8.0)

## 2022-07-10 LAB — TROPONIN I (HIGH SENSITIVITY)
Troponin I (High Sensitivity): 18 ng/L — ABNORMAL HIGH (ref <18)
Troponin I (High Sensitivity): 16 ng/L (ref <18)

## 2022-07-10 LAB — URINALYSIS, MICROSCOPIC (REFLEX)

## 2022-07-10 LAB — CREATININE, SERUM
Creatinine, Ser: 2.65 mg/dL — ABNORMAL HIGH (ref 0.44–1.00)
GFR, Estimated: 20 mL/min — ABNORMAL LOW (ref >60)

## 2022-07-10 LAB — RESP PANEL BY RT-PCR (RSV, FLU A&B, COVID)  RVPGX2
Influenza A by PCR: NEGATIVE
Influenza B by PCR: NEGATIVE
Resp Syncytial Virus by PCR: NEGATIVE
SARS Coronavirus 2 by RT PCR: NEGATIVE

## 2022-07-10 MED ORDER — ESTRADIOL 0.5 MG PO TABS
2.0000 mg | ORAL_TABLET | Freq: Every day | ORAL | Status: DC
  Administered 2022-07-10 – 2022-07-18 (×9): 2 mg via ORAL
  Filled 2022-07-10 (×10): qty 4

## 2022-07-10 MED ORDER — METHOCARBAMOL 750 MG PO TABS
750.0000 mg | ORAL_TABLET | Freq: Every day | ORAL | Status: DC
  Administered 2022-07-10 – 2022-07-18 (×9): 750 mg via ORAL
  Filled 2022-07-10: qty 1
  Filled 2022-07-10 (×2): qty 2
  Filled 2022-07-10 (×4): qty 1
  Filled 2022-07-10: qty 2
  Filled 2022-07-10: qty 1

## 2022-07-10 MED ORDER — LACTATED RINGERS IV SOLN: INTRAVENOUS | Status: DC

## 2022-07-10 MED ORDER — ONDANSETRON HCL 4 MG/2ML IJ SOLN
4.0000 mg | Freq: Once | INTRAMUSCULAR | Status: AC
  Administered 2022-07-10: 4 mg via INTRAVENOUS
  Filled 2022-07-10: qty 2

## 2022-07-10 MED ORDER — LACTATED RINGERS IV BOLUS
2000.0000 mL | Freq: Once | INTRAVENOUS | Status: AC
  Administered 2022-07-10: 2000 mL via INTRAVENOUS

## 2022-07-10 MED ORDER — SODIUM CHLORIDE 0.9 % IV BOLUS
1000.0000 mL | Freq: Once | INTRAVENOUS | Status: AC
  Administered 2022-07-10: 1000 mL via INTRAVENOUS

## 2022-07-10 MED ORDER — SODIUM CHLORIDE 0.9 % IV BOLUS: 1000.0000 mL | Freq: Once | INTRAVENOUS | Status: DC

## 2022-07-10 MED ORDER — ALBUTEROL SULFATE (2.5 MG/3ML) 0.083% IN NEBU
2.5000 mg | INHALATION_SOLUTION | Freq: Four times a day (QID) | RESPIRATORY_TRACT | Status: DC | PRN
  Administered 2022-07-14 – 2022-07-15 (×2): 2.5 mg via RESPIRATORY_TRACT
  Filled 2022-07-10 (×2): qty 3

## 2022-07-10 MED ORDER — PYRIDOSTIGMINE BROMIDE 60 MG PO TABS: 30.0000 mg | ORAL_TABLET | Freq: Three times a day (TID) | ORAL | Status: DC | PRN

## 2022-07-10 MED ORDER — HYDROMORPHONE HCL 1 MG/ML IJ SOLN
0.5000 mg | INTRAMUSCULAR | Status: DC | PRN
  Administered 2022-07-10 – 2022-07-19 (×26): 1 mg via INTRAVENOUS
  Filled 2022-07-10 (×28): qty 1

## 2022-07-10 MED ORDER — METOPROLOL SUCCINATE ER 25 MG PO TB24
25.0000 mg | ORAL_TABLET | Freq: Every day | ORAL | Status: DC
  Administered 2022-07-10 – 2022-07-18 (×6): 25 mg via ORAL
  Filled 2022-07-10 (×9): qty 1

## 2022-07-10 MED ORDER — DESMOPRESSIN ACETATE 0.1 MG PO TABS
0.6000 mg | ORAL_TABLET | Freq: Every day | ORAL | Status: DC
  Administered 2022-07-10 – 2022-07-18 (×9): 0.6 mg via ORAL
  Filled 2022-07-10 (×10): qty 6
  Filled 2022-07-10: qty 3

## 2022-07-10 MED ORDER — PANTOPRAZOLE SODIUM 40 MG PO TBEC
40.0000 mg | DELAYED_RELEASE_TABLET | Freq: Every day | ORAL | Status: DC
  Administered 2022-07-11 – 2022-07-19 (×9): 40 mg via ORAL
  Filled 2022-07-10 (×9): qty 1

## 2022-07-10 MED ORDER — CYANOCOBALAMIN 1000 MCG/ML IJ SOLN: 1000.0000 ug | INTRAMUSCULAR | Status: DC

## 2022-07-10 MED ORDER — PROGESTERONE MICRONIZED 100 MG PO CAPS: 100.0000 mg | ORAL_CAPSULE | Freq: Every day | ORAL | Status: DC

## 2022-07-10 MED ORDER — HEPARIN SODIUM (PORCINE) 5000 UNIT/ML IJ SOLN
5000.0000 [IU] | Freq: Three times a day (TID) | INTRAMUSCULAR | Status: DC
  Administered 2022-07-10 – 2022-07-19 (×25): 5000 [IU] via SUBCUTANEOUS
  Filled 2022-07-10 (×26): qty 1

## 2022-07-10 MED ORDER — PROGESTERONE MICRONIZED 100 MG PO CAPS
100.0000 mg | ORAL_CAPSULE | Freq: Every day | ORAL | Status: DC
  Administered 2022-07-10 – 2022-07-18 (×9): 100 mg via ORAL
  Filled 2022-07-10 (×10): qty 1

## 2022-07-10 MED ORDER — MYCOPHENOLATE MOFETIL 250 MG PO CAPS
1000.0000 mg | ORAL_CAPSULE | Freq: Two times a day (BID) | ORAL | Status: DC
  Administered 2022-07-10 – 2022-07-19 (×18): 1000 mg via ORAL
  Filled 2022-07-10 (×18): qty 4

## 2022-07-10 MED ORDER — MORPHINE SULFATE (PF) 4 MG/ML IV SOLN
4.0000 mg | Freq: Once | INTRAVENOUS | Status: AC
  Administered 2022-07-10: 4 mg via INTRAVENOUS
  Filled 2022-07-10: qty 1

## 2022-07-10 MED ORDER — ONDANSETRON HCL 4 MG/2ML IJ SOLN
4.0000 mg | Freq: Four times a day (QID) | INTRAMUSCULAR | Status: AC | PRN
  Administered 2022-07-11 (×2): 4 mg via INTRAVENOUS
  Filled 2022-07-10 (×2): qty 2

## 2022-07-10 MED ORDER — METOCLOPRAMIDE HCL 5 MG/ML IJ SOLN
10.0000 mg | Freq: Once | INTRAMUSCULAR | Status: AC
  Administered 2022-07-10: 10 mg via INTRAVENOUS
  Filled 2022-07-10: qty 2

## 2022-07-10 NOTE — ED Notes (Signed)
Error in charting on MAR. Pt received a total of 2,092ml of NS. 1st bag of LR (1,071ml) infusing at time of transport, needs a total of 2,067ml of LR.

## 2022-07-10 NOTE — ED Provider Notes (Addendum)
[facility] Provider Note   CSN: [id number] Arrival date & time: 07/10/22  1203     History  Chief Complaint  Patient presents with   Weakness    Teresa Lee is a 62 y.o. female with an extensive past medical history inclusive of hypertension, hyperlipidemia, Crohn's, myasthenia gravis and fibromyalgia presenting with several complaints.  On 4/1 she had the second shingles shot.  About a week later she started having some pain and difficulty ambulating on her right leg.  She is also been increasingly weak and tired ever since then.  Husband says it seems like she is occasionally slurring her speech and she says that she can "barely talk sometimes."  Husband says that she is unable to bend over and put her shoes on.  They report that it all seems to be getting worse over the past week.  Additionally, patient reports that she is not having consistent bowel movements and is constipated from her opioid pain medications.  She has tried enemas and suppositories without relief.  Does not take any laxatives     Weakness Associated symptoms: myalgias   Associated symptoms: no chest pain, no dysuria, no fever and no shortness of breath        Home Medications Prior to Admission medications   Medication Sig Start Date End Date Taking? Authorizing Provider  albuterol (PROVENTIL) (2.5 MG/3ML) 0.083% nebulizer solution Take 2.5 mg by nebulization every 6 (six) hours as needed for shortness of breath. 12/26/19   [provider]  albuterol (VENTOLIN HFA) 108 (90 Base) MCG/ACT inhaler 1 PUFF EVERY 6 HOURS AS NEEDED FOR WHEEZING Patient taking differently: Inhale 1 puff into the lungs every 6 (six) hours as needed for wheezing or shortness of breath. 09/05/19   [place], Betty G, MD  cyanocobalamin (,VITAMIN B-12,) 1000 MCG/ML injection Inject 1,000 mcg into the muscle See admin instructions. Every 6-8 weeks    [provider]   desmopressin (DDAVP) 0.2 MG tablet Take 600 mcg by mouth at bedtime. 08/22/21   [provider]  estradiol (ESTRACE) 2 MG tablet Take 2 mg by mouth at bedtime.    [provider]  HYDROcodone-acetaminophen (NORCO) 10-325 MG tablet Take 1 tablet by mouth 5 (five) times daily as needed for moderate pain. 08/25/17   [provider]  loperamide (IMODIUM) 2 MG capsule Take 2 capsules (4 mg total) by mouth as needed for diarrhea or loose stools. 09/19/21   Glade Lloyd, MD  losartan (COZAAR) 50 MG tablet Take 50 mg by mouth daily. 09/30/21   [provider]  meloxicam (MOBIC) 15 MG tablet Take 15 mg by mouth daily.    [provider]  Mesalamine (ASACOL) 400 MG CPDR DR capsule Take 800 mg by mouth 2 (two) times daily. 10/10/21   [provider]  methocarbamol (ROBAXIN) 750 MG tablet Take 750 mg by mouth at bedtime. 02/07/19   [provider]  metoprolol succinate (TOPROL-XL) 25 MG 24 hr tablet Take 25 mg by mouth at bedtime. 09/09/21   [provider]  mycophenolate (CELLCEPT) 500 MG tablet Take 1,000 mg by mouth 2 (two) times daily.     [provider]  naloxone [facility]) nasal spray 4 mg/0.1 mL Place 1 spray into the nose once as needed (opioid overdose).    [provider]  ondansetron (ZOFRAN) 4 MG tablet Take 4 mg by mouth every 8 (eight) hours as needed for nausea or vomiting.  [provider]  pantoprazole (PROTONIX) 40 MG tablet TAKE 1 TABLET DAILY Patient taking differently: Take 40 mg by mouth 2 (two) times daily. 10/15/19   [place], Betty G, MD  potassium chloride SA (KLOR-CON M) 20 MEQ tablet Take 20 mEq by mouth 2 (two) times daily. 06/15/21   [provider]  predniSONE (DELTASONE) 10 MG tablet Take 10 mg by mouth every morning.    [provider]  pregabalin (LYRICA) 50 MG capsule Take 50 mg by mouth at bedtime. 09/12/21   [provider]  progesterone (PROMETRIUM) 100 MG  capsule Take 100 mg by mouth at bedtime.    [provider]  rOPINIRole (REQUIP) 1 MG tablet Take 1-2 tablets (1-2 mg total) by mouth at bedtime. Patient taking differently: Take 1 mg by mouth See admin instructions. Take 1 tablet (1 mg) by mouth daily at bedtime, may take a 2nd tablet (1 mg) later if still needed for restless legs 08/08/19   [place], Betty G, MD  rosuvastatin (CRESTOR) 40 MG tablet Take 1 tablet (40 mg total) by mouth daily. 02/02/19   [place], Betty G, MD  Spacer/Aero-Holding Chambers (AEROCHAMBER PLUS) inhaler Use as instructed to use with inahaler. 08/30/18   [place], Betty G, MD  SYMBICORT 80-4.5 MCG/ACT inhaler Inhale 2 puffs into the lungs 2 (two) times daily. Patient taking differently: Inhale 2 puffs into the lungs 2 (two) times daily as needed (shortness of breath). 09/05/19   [place], Betty G, MD  Vitamin D, Ergocalciferol, (DRISDOL) 1.25 MG (50000 UNIT) CAPS capsule Take 50,000 Units by mouth every 14 (fourteen) days.    [provider]      Allergies    Duloxetine, Latex, Lorazepam, Orange concentrate [flavoring agent], Other, Percocet [oxycodone-acetaminophen], Sulfa antibiotics, Bioflavonoids, Cefixime, and Nucynta [tapentadol]    Review of Systems   Review of Systems  Constitutional:  Positive for fatigue. Negative for chills and fever.  Respiratory:  Negative for shortness of breath.   Cardiovascular:  Negative for chest pain and palpitations.  Genitourinary:  Negative for dysuria.  Musculoskeletal:  Positive for myalgias.  Neurological:  Positive for weakness.    Physical Exam Updated Vital Signs BP 112/81 (BP Location: Right Arm)   Pulse 79   Temp 98.2 F (36.8 C) (Oral)   Resp 20   Ht 5' (1.524 m)   Wt 70.3 kg   SpO2 100%   BMI 30.27 kg/m  Physical Exam Vitals and nursing note reviewed.  Constitutional:      Appearance: Normal appearance.  HENT:     Head: Normocephalic and atraumatic.  Eyes:     General: Visual field deficit  present. No scleral icterus.    Conjunctiva/sclera: Conjunctivae normal.  Pulmonary:     Effort: Pulmonary effort is normal. No respiratory distress.  Skin:    Findings: No rash.  Neurological:     General: No focal deficit present.     Mental Status: She is alert and oriented to person, place, and time.     Cranial Nerves: No cranial nerve deficit, dysarthria or facial asymmetry.     Sensory: No sensory deficit.     Motor: No weakness, atrophy, abnormal muscle tone, seizure activity or pronator drift.     Coordination: Coordination normal. Finger-Nose-Finger Test normal.     Comments: Patient reports she always has somewhat of a peripheral field deficit in her right side.  This is unchanged from baseline for many years.  No other cranial nerve abnormalities.  EOMs intact.  Normal  strength in bilateral upper and lower extremity  Psychiatric:        Mood and Affect: Mood normal.     ED Results / Procedures / Treatments   Labs (all labs ordered are listed, but only abnormal results are displayed) Labs Reviewed - No data to display  EKG None  Radiology CT ABDOMEN PELVIS WO CONTRAST  Result Date: 07/10/2022 CLINICAL DATA:  Abdominal pain EXAM: CT ABDOMEN AND PELVIS WITHOUT CONTRAST TECHNIQUE: Multidetector CT imaging of the abdomen and pelvis was performed following the standard protocol without IV contrast. RADIATION DOSE REDUCTION: This exam was performed according to the departmental dose-optimization program which includes automated exposure control, adjustment of the mA and/or kV according to patient size and/or use of iterative reconstruction technique. COMPARISON:  CT AP 09/16/21 FINDINGS: Lower chest: Lung bases are clear. Note the lack of IV contrast limits the ability to assess the abdominal and pelvic solid organs. Hepatobiliary: Liver has a normal contour. No focal liver lesions. No evidence of intra or extrahepatic biliary ductal dilatation. Gallbladder is surgically absent.  Pancreas: No evidence of peripancreatic fat stranding to suggest pancreatitis. Spleen: Normal in size without focal abnormality. Adrenals/Urinary Tract: Bilateral adrenal glands are normal in size without focal lesions. Bilateral kidneys are symmetric in size without evidence of hydronephrosis or nephrolithiasis. No focal renal lesions are visualized. Urinary bladder is fluid-filled without focal wall thickening. Stomach/Bowel: Stomach, small bowel, large bowel are normal in caliber. There is diverticulosis without evidence of diverticulitis. The appendix is normal in appearance. Vascular/Lymphatic: No significant vascular findings are present. No enlarged abdominal or pelvic lymph nodes. Reproductive: Uterus and bilateral adnexa are unremarkable. Other: No abdominal wall hernia or abnormality. No abdominopelvic ascites. Musculoskeletal: Postsurgical changes from L4-L5 posterior spinal fusion. No evidence of hardware complications. Acute osseous abnormality. IMPRESSION: No acute abnormality.  No finding to explain abdominal pain. Electronically Signed   By: Lorenza Cambridge M.D.   On: 07/10/2022 14:46    Procedures Procedures   Medications Ordered in ED Medications  sodium chloride 0.9 % bolus 1,000 mL (has no administration in time range)  morphine (PF) 4 MG/ML injection 4 mg (4 mg Intravenous Given 07/10/22 1312)  metoCLOPramide (REGLAN) injection 10 mg (10 mg Intravenous Given 07/10/22 1311)  sodium chloride 0.9 % bolus 1,000 mL (1,000 mLs Intravenous New Bag/Given 07/10/22 1309)    ED Course/ Medical Decision Making/ A&P                             Medical Decision Making Amount and/or Complexity of Data Reviewed Labs: ordered. Radiology: ordered. ECG/medicine tests: ordered.  Risk Prescription drug management. Decision regarding hospitalization.   62 year old female presenting today with multiple complaints.  Started after shingles injection.  Differential includes but is not limited to  myasthenia crisis, bowel obstruction due to constipation and abdominal pain, electrolyte abnormality, COVID.   this is not an exhaustive differential.    Past Medical History / Co-morbidities / Social History: Myasthenia gravis, fibromyalgia, hyperlipidemia, hypertension    Physical Exam: Pertinent physical exam findings include generalized weakness.  No focal findings on her neurologic exam  Lab Tests: I ordered, and personally interpreted labs.  The pertinent results include: Creatinine 2.76, above baseline that appears to be 1.5 ALT 223, AST 202, normal T. Bili CK 8200 Dark urine   Imaging Studies: Head and CT abdomen pelvis without acute findings.    Medications: Morphine, Reglan and fluids with improvement of her pain.  MDM/Disposition: This is a 62 year old female who presented today with weakness.  She does have a history of myasthenia gravis but says that she feels very fatigued and tired generally throughout her body.  Does report increased pain in the right lower extremity from usual fibromyalgia.  CK elevated to 8 200, elevated LFTs, dark urine and elevated WBC.  Patient's presentation is consistent with rhabdomyolysis.  Unsure exactly why she is in rhabdo as she says that she does not have any recent illnesses, no changes in her medications, although she has been on Crestor for many years.  Do not believe that the shingles shot is caused all of these.  Patient does report that she has been very sedentary recently so it may just be her lifestyle in conjunction with her statin.  Regardless requires admission to the hospitalist for rhabdo.  Dr. Natale Milch is the accepting physician   Final Clinical Impression(s) / ED Diagnoses Final diagnoses:  Non-traumatic rhabdomyolysis    Rx / DC Orders ED Discharge Orders     None      I discussed this case with my attending physician Dr. Manus Gunning who cosigned this note including patient's presenting symptoms, physical exam, and  planned diagnostics and interventions. Attending physician stated agreement with plan or made changes to plan which were implemented.      Saddie Benders, PA-C 07/10/22 1451    Woodroe Chen 07/10/22 1501    Glynn Octave, MD 07/10/22 1745

## 2022-07-10 NOTE — Progress Notes (Signed)
  PROGRESS NOTE    Teresa Lee  EAV:[id number] DOB: [birth year] DOA: 07/10/2022 PCP: [name], MD   Brief Narrative:  Patient presented to [facility] with worsening fatigue/weakness - found to be dehydrated with AKI, elevated CK - no recent fall/trauma/prolonged down time. Noted hemoconcentration with elevated LFTs, and leukocytosis. Rhabdo thought to be secondary to sedentary lifestyle with increased weakness. Urine abdnormal - likely secondary to dehydration/rhabdo. ED to increase IVF - imaging currently pending but no obvious source of trauma/injury.  Patient has history of MS/fibromyalgia - unclear if acute MS flare ongoing - imaging pending as above.   [name], DO [facility]  If 7PM-7AM, please contact night-coverage [email or web address]  07/10/2022, 2:44 PM

## 2022-07-10 NOTE — ED Notes (Signed)
Report rec'd from prev RN 

## 2022-07-10 NOTE — ED Triage Notes (Signed)
Pt states feels nauseated and weak since getting shingles shot on 4/1  Pt states body hurts after pushing to have a bowel movement States full body aches when moving any way  States feel knots in legs that are able to be rubbed out  States all she wants to do is sleep   Took hydrocone pta  H/o myasthenia gravis

## 2022-07-10 NOTE — ED Notes (Signed)
[facility] is on the floor, paperwork given, pt belongings bagged and sent with husband

## 2022-07-10 NOTE — H&P (Signed)
History and Physical    Patient: Teresa Lee:[id number] DOB: 01-27-1961 DOA: 07/10/2022 DOS: the patient was seen and examined on 07/10/2022 PCP: [name], MD  Patient coming from: Home  Chief Complaint:  Chief Complaint  Patient presents with   Weakness   HPI: Teresa Lee is a 62 y.o. female with medical history significant of OA, HTN, HLD on statin, MG who presented to ED with fatigue and generalized weakness, prompting visit to [facility] ED. Work up confirmed ARF with an elevated CK in excess of 8700. Pt was started on IVF and admitted to [facility] for further work up.  On further questioning, pt reported generalized weakness and generalized muscle aches that started around 2wks PTA. No trauma. Denies laying on hard surfaces. No new medications. Weakness and muslce aches became worse, prompting EDvisit per above. Pt reports muscle aches are worse along B proximal LE.   Review of Systems: As mentioned in the history of present illness. All other systems reviewed and are negative. Past Medical History:  Diagnosis Date   Anxiety    Arthritis    osteoarthritis   Asthma    Blind left eye    Cancer    skin   Crohn's disease    CTS (carpal tunnel syndrome)    DJD (degenerative joint disease)    Both cervical spine and LS spine   Fibromyalgia    GERD (gastroesophageal reflux disease)    Heart murmur    Herniated nucleus pulposus    Hyperlipidemia    Hypertension    IBS (irritable bowel syndrome)    Leukocytosis    Myasthenia gravis    Myasthenia gravis    Myasthenia gravis    Pneumonia    Sleep apnea    Spinal cord stimulator status    Past Surgical History:  Procedure Laterality Date   BACK SURGERY     BIOPSY  05/24/2020   Procedure: BIOPSY;  Surgeon: Bernette Redbird, MD;  Location: [facility] ENDOSCOPY;  Service: Endoscopy;;   BREAST REDUCTION SURGERY     CARPAL TUNNEL RELEASE     bilateral   cervical disc inj     CHOLECYSTECTOMY     COLONOSCOPY WITH PROPOFOL  N/A 05/24/2020   Procedure: COLONOSCOPY WITH PROPOFOL;  Surgeon: Bernette Redbird, MD;  Location: [facility] ENDOSCOPY;  Service: Endoscopy;  Laterality: N/A;   ENDOMETRIAL ABLATION W/ NOVASURE     ESOPHAGOGASTRODUODENOSCOPY (EGD) WITH PROPOFOL N/A 05/24/2020   Procedure: ESOPHAGOGASTRODUODENOSCOPY (EGD) WITH PROPOFOL;  Surgeon: Bernette Redbird, MD;  Location: [facility] ENDOSCOPY;  Service: Endoscopy;  Laterality: N/A;   LUMBAR LAMINECTOMY/DECOMPRESSION MICRODISCECTOMY Right 05/04/2020   Procedure: Right Lumbar Five Sacral One Microdiscectomy;  Surgeon: Maeola Harman, MD;  Location: [facility] OR;  Service: Neurosurgery;  Laterality: Right;  posterior   MELANOMA EXCISION     NASAL SINUS SURGERY     SPINAL CORD STIMULATOR INSERTION  2019   SPINAL CORD STIMULATOR INSERTION N/A 10/18/2021   Procedure: REMOVAL OF  SPINAL CORD STIMULATOR;  Surgeon: Dawley, Alan Mulder, DO;  Location: [facility] OR;  Service: Neurosurgery;  Laterality: N/A;  3C   THYMECTOMY     due to myastenia gravis   THYMECTOMY     TRANSFORAMINAL LUMBAR INTERBODY FUSION (TLIF) WITH PEDICLE SCREW FIXATION 1 LEVEL Right 05/04/2020   Procedure: Right Lumbar Four-Five Transforaminal lumbar interbody fusion;  Surgeon: Maeola Harman, MD;  Location: [facility] OR;  Service: Neurosurgery;  Laterality: Right;  posterior   Social History:  reports that she has never smoked. She has  never used smokeless tobacco. She reports current alcohol use. She reports that she does not use drugs.  Allergies  Allergen Reactions   Duloxetine Nausea Only    Cymbalta    Latex Other (See Comments)    BLISTER   Lorazepam Other (See Comments)    Hallucinations   Orange Concentrate [Flavoring Agent] Other (See Comments)    Acid reflux   Other Hives, Nausea Only and Other (See Comments)    Some nuts cause a rash   Percocet [Oxycodone-Acetaminophen] Hives   Sulfa Antibiotics Hives   Bioflavonoids Rash    Causes burning of skin and blisters   Cefixime Other (See Comments)    Due to myasthenia gravis    Nucynta [Tapentadol] Other (See Comments)    Headache     Family History  Problem Relation Age of Onset   Asthma Daughter    Heart disease Mother    Cancer Mother    Hyperlipidemia Mother    Hypertension Mother    Diabetes Father     Prior to Admission medications   Medication Sig Start Date End Date Taking? Authorizing Provider  albuterol (PROVENTIL) (2.5 MG/3ML) 0.083% nebulizer solution Take 2.5 mg by nebulization every 6 (six) hours as needed for shortness of breath. 12/26/19   [provider]  albuterol (VENTOLIN HFA) 108 (90 Base) MCG/ACT inhaler 1 PUFF EVERY 6 HOURS AS NEEDED FOR WHEEZING Patient taking differently: Inhale 1 puff into the lungs every 6 (six) hours as needed for wheezing or shortness of breath. 09/05/19   [place], Betty G, MD  cyanocobalamin (,VITAMIN B-12,) 1000 MCG/ML injection Inject 1,000 mcg into the muscle See admin instructions. Every 6-8 weeks    [provider]  desmopressin (DDAVP) 0.2 MG tablet Take 600 mcg by mouth at bedtime. 08/22/21   [provider]  estradiol (ESTRACE) 2 MG tablet Take 2 mg by mouth at bedtime.    [provider]  HYDROcodone-acetaminophen (NORCO) 10-325 MG tablet Take 1 tablet by mouth 5 (five) times daily as needed for moderate pain. 08/25/17   [provider]  loperamide (IMODIUM) 2 MG capsule Take 2 capsules (4 mg total) by mouth as needed for diarrhea or loose stools. 09/19/21   Glade Lloyd, MD  losartan (COZAAR) 50 MG tablet Take 50 mg by mouth daily. 09/30/21   [provider]  meloxicam (MOBIC) 15 MG tablet Take 15 mg by mouth daily.    [provider]  Mesalamine (ASACOL) 400 MG CPDR DR capsule Take 800 mg by mouth 2 (two) times daily. 10/10/21   [provider]  methocarbamol (ROBAXIN) 750 MG tablet Take 750 mg by mouth at bedtime. 02/07/19   [provider]  metoprolol succinate (TOPROL-XL) 25 MG 24 hr tablet Take 25 mg by mouth at bedtime.  09/09/21   [provider]  mycophenolate (CELLCEPT) 500 MG tablet Take 1,000 mg by mouth 2 (two) times daily.     [provider]  naloxone [facility]) nasal spray 4 mg/0.1 mL Place 1 spray into the nose once as needed (opioid overdose).    [provider]  ondansetron (ZOFRAN) 4 MG tablet Take 4 mg by mouth every 8 (eight) hours as needed for nausea or vomiting.    [provider]  pantoprazole (PROTONIX) 40 MG tablet TAKE 1 TABLET DAILY Patient taking differently: Take 40 mg by mouth 2 (two) times daily. 10/15/19   [place], Betty G, MD  potassium chloride SA (KLOR-CON M) 20 MEQ tablet Take 20 mEq  by mouth 2 (two) times daily. 06/15/21   [provider]  predniSONE (DELTASONE) 10 MG tablet Take 10 mg by mouth every morning.    [provider]  pregabalin (LYRICA) 50 MG capsule Take 50 mg by mouth at bedtime. 09/12/21   [provider]  progesterone (PROMETRIUM) 100 MG capsule Take 100 mg by mouth at bedtime.    [provider]  rOPINIRole (REQUIP) 1 MG tablet Take 1-2 tablets (1-2 mg total) by mouth at bedtime. Patient taking differently: Take 1 mg by mouth See admin instructions. Take 1 tablet (1 mg) by mouth daily at bedtime, may take a 2nd tablet (1 mg) later if still needed for restless legs 08/08/19   [place], Betty G, MD  rosuvastatin (CRESTOR) 40 MG tablet Take 1 tablet (40 mg total) by mouth daily. 02/02/19   [place], Betty G, MD  Spacer/Aero-Holding Chambers (AEROCHAMBER PLUS) inhaler Use as instructed to use with inahaler. 08/30/18   [place], Betty G, MD  SYMBICORT 80-4.5 MCG/ACT inhaler Inhale 2 puffs into the lungs 2 (two) times daily. Patient taking differently: Inhale 2 puffs into the lungs 2 (two) times daily as needed (shortness of breath). 09/05/19   [place], Betty G, MD  Vitamin D, Ergocalciferol, (DRISDOL) 1.25 MG (50000 UNIT) CAPS capsule Take 50,000 Units by mouth every 14 (fourteen) days.    [provider]     Physical Exam: Vitals:   07/10/22 1330 07/10/22 1500 07/10/22 1545 07/10/22 1651  BP: 110/61 124/64 125/67 (!) 144/76  Pulse: 71 75 76 71  Resp: Temp: 98.2 F (36.8 C)  98.2 F (36.8 C) 98.5 F (36.9 C)  TempSrc:    Oral  SpO2: 97% 100% 98% 100%  Weight:    72.9 kg  Height:    5' (1.524 m)   General exam: Awake, laying in bed, in nad Respiratory system: Normal respiratory effort, no wheezing Cardiovascular system: regular rate, s1, s2 Gastrointestinal system: Soft, nondistended, positive BS Central nervous system: CN2-12 grossly intact, strength intact Extremities: Perfused, no clubbing Skin: Normal skin turgor, no notable skin lesions seen Psychiatry: Mood normal // no visual hallucinations   Data Reviewed:  Labs reviewed: Na 135, K 3.5, Cr 2.76, CK 8200  Assessment and Plan: Rhabdo -Unclear etiology -cont IVF -Follow CK trends -Hold statin per below  ARF -Secondary to rhabdo -cont IVF -recheck bmet in AM  MG -seems stable -Consult PT/OT  HLD -hold statin given rhabdo  HTN -BP stable at this time -Cont home meds once confirmed by pharmacy    Advance Care Planning:   Code Status: Prior Full  Consults:   Family Communication: Pt in room  Severity of Illness: The appropriate patient status for this patient is INPATIENT. Inpatient status is judged to be reasonable and necessary in order to provide the required intensity of service to ensure the patient's safety. The patient's presenting symptoms, physical exam findings, and initial radiographic and laboratory data in the context of their chronic comorbidities is felt to place them at high risk for further clinical deterioration. Furthermore, it is not anticipated that the patient will be medically stable for discharge from the hospital within 2 midnights of admission.   * I certify that at the point of admission it is my clinical judgment that the patient will require inpatient hospital  care spanning beyond 2 midnights from the point of admission due to high intensity of service, high risk for further deterioration and high frequency of surveillance required.*  Author: Rickey Barbara, MD 07/10/2022 5:09 PM  For on call review [email or web address].

## 2022-07-11 ENCOUNTER — Inpatient Hospital Stay (HOSPITAL_COMMUNITY): Payer: Medicare HMO

## 2022-07-11 DIAGNOSIS — M6282 Rhabdomyolysis: Secondary | ICD-10-CM | POA: Diagnosis not present

## 2022-07-11 LAB — COMPREHENSIVE METABOLIC PANEL
ALT: 174 U/L — ABNORMAL HIGH (ref 0–44)
AST: 147 U/L — ABNORMAL HIGH (ref 15–41)
Albumin: 2.7 g/dL — ABNORMAL LOW (ref 3.5–5.0)
Alkaline Phosphatase: 80 U/L (ref 38–126)
Anion gap: 11 (ref 5–15)
BUN: 46 mg/dL — ABNORMAL HIGH (ref 6–20)
CO2: 18 mmol/L — ABNORMAL LOW (ref 22–32)
Calcium: 8.2 mg/dL — ABNORMAL LOW (ref 8.9–10.3)
Chloride: 112 mmol/L — ABNORMAL HIGH (ref 98–111)
Creatinine, Ser: 2.61 mg/dL — ABNORMAL HIGH (ref 0.44–1.00)
GFR, Estimated: 20 mL/min — ABNORMAL LOW (ref >60)
Glucose, Bld: 130 mg/dL — ABNORMAL HIGH (ref 70–99)
Potassium: 3.6 mmol/L (ref 3.5–5.1)
Sodium: 141 mmol/L (ref 135–145)
Total Bilirubin: 0.7 mg/dL (ref 0.3–1.2)
Total Protein: 5.3 g/dL — ABNORMAL LOW (ref 6.5–8.1)

## 2022-07-11 LAB — CBC
HCT: 29.6 % — ABNORMAL LOW (ref 36.0–46.0)
Hemoglobin: 9.7 g/dL — ABNORMAL LOW (ref 12.0–15.0)
MCH: 32 pg (ref 26.0–34.0)
MCHC: 32.8 g/dL (ref 30.0–36.0)
MCV: 97.7 fL (ref 80.0–100.0)
Platelets: 179 10*3/uL (ref 150–400)
RBC: 3.03 MIL/uL — ABNORMAL LOW (ref 3.87–5.11)
RDW: 13.6 % (ref 11.5–15.5)
WBC: 13.1 10*3/uL — ABNORMAL HIGH (ref 4.0–10.5)
nRBC: 0 % (ref 0.0–0.2)

## 2022-07-11 LAB — CK: Total CK: 5803 U/L — ABNORMAL HIGH (ref 38–234)

## 2022-07-11 MED ORDER — ONDANSETRON HCL 4 MG/2ML IJ SOLN
4.0000 mg | Freq: Four times a day (QID) | INTRAMUSCULAR | Status: DC | PRN
  Administered 2022-07-11 – 2022-07-19 (×24): 4 mg via INTRAVENOUS
  Filled 2022-07-11 (×27): qty 2

## 2022-07-11 MED ORDER — ACETAMINOPHEN 325 MG PO TABS
650.0000 mg | ORAL_TABLET | Freq: Once | ORAL | Status: AC
  Administered 2022-07-11: 650 mg via ORAL
  Filled 2022-07-11: qty 2

## 2022-07-11 MED ORDER — DIPHENHYDRAMINE HCL 50 MG/ML IJ SOLN
12.5000 mg | Freq: Four times a day (QID) | INTRAMUSCULAR | Status: DC | PRN
  Administered 2022-07-12 – 2022-07-17 (×3): 12.5 mg via INTRAVENOUS
  Filled 2022-07-11 (×3): qty 1

## 2022-07-11 MED ORDER — LACTATED RINGERS IV BOLUS
250.0000 mL | Freq: Once | INTRAVENOUS | Status: AC
  Administered 2022-07-11: 250 mL via INTRAVENOUS

## 2022-07-11 NOTE — Hospital Course (Signed)
62 y.o. female with medical history significant of OA, HTN, HLD on statin, MG who presented to ED with fatigue and generalized weakness, prompting visit to [facility] ED. Work up confirmed ARF with an elevated CK in excess of 8700. Pt was started on IVF and admitted to [facility] for further work up. Pt had reported generalized weakness and generalized muscle aches that started around 2wks PTA. No trauma. Denies laying on hard surfaces. No new medications. Weakness and muslce aches became worse, prompting ED visit

## 2022-07-11 NOTE — Progress Notes (Addendum)
  Progress Note   Patient: Teresa Lee VWU:[id number] DOB: [birth year] DOA: 07/10/2022     1 DOS: the patient was seen and examined on 07/11/2022   Brief hospital course: 62 y.o. female with medical history significant of OA, HTN, HLD on statin, MG who presented to ED with fatigue and generalized weakness, prompting visit to [facility] ED. Work up confirmed ARF with an elevated CK in excess of 8700. Pt was started on IVF and admitted to [facility] for further work up. Pt had reported generalized weakness and generalized muscle aches that started around 2wks PTA. No trauma. Denies laying on hard surfaces. No new medications. Weakness and muslce aches became worse, prompting ED visit  Assessment and Plan: Rhabdo -Uncertain etiology -continue to hold statin -Presenting CK 8700, improved to 5000 today -Family reports pt has been mainly bed bound recently secondary to generalized weakness, however has not been laying on hard surface for prolonged time -Tolerating IVF however still appears dry, thus will give 250cc bolus and cont basal IVF -PT/OT consulted with recs for HHPT/OT at time of d/c -follow CK trends, will check TSH   ARF -Secondary to rhabdo, also appears dehydrated on exam -Tolerating IVF, will continue -holding ARB -recheck bmet in AM   MG -seems stable -cont home regimen -PT/OT consulted with recs for HHPT/OT   HLD -hold statin given rhabdo   HTN -BP stable at this time -Cont metoprolol -holding ARB given ARF  R hip pain -Suspect related to above rhabdo -Ordered and reviewed hip xray, no fractures      Subjective: Still feeling weak  Physical Exam: Vitals:   07/10/22 1651 07/11/22 0210 07/11/22 0453 07/11/22 1242  BP: (!) 144/76 (!) 148/76 134/76 (!) 157/75  Pulse: 71 71 71 75  Resp: Temp: 98.5 F (36.9 C) 98.1 F (36.7 C) 98.1 F (36.7 C) 99 F (37.2 C)  TempSrc: Oral Oral Oral Oral  SpO2: 100% 99% 99% 100%  Weight: 72.9 kg     Height: 5' (1.524  m)      General exam: Conversant, in no acute distress Respiratory system: normal chest rise, clear, no audible wheezing Cardiovascular system: regular rhythm, s1-s2 Gastrointestinal system: Nondistended, nontender, pos BS Central nervous system: No seizures, no tremors Extremities: No cyanosis, no joint deformities Skin: No rashes, no pallor Psychiatry: Affect normal // no auditory hallucinations   Data Reviewed:  Labs reviewed: Na 141, K 3.6, Cr 2.61, AST 147, ALT 174, Alk phos 80, WBC 13.1, Hgb 9.7   Family Communication: Pt in room, family at bedside  Disposition: Status is: Inpatient Remains inpatient appropriate because: Severity of illness  Planned Discharge Destination: Home with Home Health   Author: Rickey Barbara, MD 07/11/2022 4:29 PM  For on call review [email or web address].

## 2022-07-11 NOTE — TOC Initial Note (Addendum)
Transition of Care [facility]) - Initial/Assessment Note    Patient Details  Name: Teresa Lee MRN: [id number] Date of Birth: 1960/06/23  Transition of [facility]) CM/SW Contact:    Otelia Santee, LCSW Phone Number: 07/11/2022, 1:27 PM  Clinical Narrative:                 Met with pt at bedside and confirmed plan for [facility] services. Pt shares she lives at home with her spouse and has a cane and RW. Pt believes RW was obtained through her husbands insurance. Pt is agreeable to [facility] and rollator. HHPT has been arranged with [facility]. HH order will need to be placed prior to pt discharging. Rollator has been ordered through [employer] and will be delivered to pt's room prior to discharge.   Expected Discharge Plan: Home w Home Health Services Barriers to Discharge: No Barriers Identified   Patient Goals and CMS Choice Patient states their goals for this hospitalization and ongoing recovery are:: To go home CMS Medicare.gov Compare Post Acute Care list provided to:: Patient Choice offered to / list presented to : Patient [place] ownership interest in [facility].provided to:: Patient    Expected Discharge Plan and Services In-house Referral: Clinical Social Work Discharge Planning Services: NA Post Acute Care Choice: Home Health, Durable Medical Equipment Living arrangements for the past 2 months: Single Family Home                 DME Arranged: Walker rolling with seat DME Agency: [employer] Date DME Agency Contacted: 07/11/22 Time DME Agency Contacted: 1326 Representative spoke with at DME Agency: Vaughan Basta HH Arranged: PT HH Agency: [facility] Date [facility] Agency Contacted: 07/12/22 Time HH Agency Contacted: 1326 Representative spoke with at [facility] Agency: Marylene Land  Prior Living Arrangements/Services Living arrangements for the past 2 months: Single Family Home Lives with:: Spouse Patient language and need for interpreter reviewed:: Yes Do you feel safe  going back to the place where you live?: Yes      Need for Family Participation in Patient Care: No (Comment) Care giver support system in place?: No (comment) Current home services: DME (RW) Criminal Activity/Legal Involvement Pertinent to Current Situation/Hospitalization: No - Comment as needed  Activities of Daily Living Home Assistive Devices/Equipment: None ADL Screening (condition at time of admission) Patient's cognitive ability adequate to safely complete daily activities?: Yes Is the patient deaf or have difficulty hearing?: No Does the patient have difficulty seeing, even when wearing glasses/contacts?: No Does the patient have difficulty concentrating, remembering, or making decisions?: No Patient able to express need for assistance with ADLs?: Yes Does the patient have difficulty dressing or bathing?: No Independently performs ADLs?: Yes (appropriate for developmental age) Does the patient have difficulty walking or climbing stairs?: No Weakness of Legs: None Weakness of Arms/Hands: None  Permission Sought/Granted Permission sought to share information with : Facility Industrial/product designer granted to share information with : Yes, Verbal Permission Granted     Permission granted to share info w AGENCY: [facility]        Emotional Assessment Appearance:: Appears stated age Attitude/Demeanor/Rapport: Engaged Affect (typically observed): Accepting Orientation: : Oriented to Self, Oriented to Place, Oriented to  Time, Oriented to Situation Alcohol / Substance Use: Not Applicable Psych Involvement: No (comment)  Admission diagnosis:  Rhabdomyolysis [M62.82] Non-traumatic rhabdomyolysis [M62.82] ARF (acute renal failure) [N17.9] Patient Active Problem List   Diagnosis Date Noted   Rhabdomyolysis 07/10/2022   Acute gastroenteritis 09/17/2021  Acute on chronic kidney failure 09/16/2021   SIRS (systemic inflammatory response syndrome) 06/03/2020   ARF (acute  renal failure) 05/22/2020   Diarrhea 05/22/2020   Nausea 05/22/2020   Spondylolisthesis of lumbar region 05/04/2020   Chronic pain disorder 08/12/2019   COVID-19    Acute on chronic renal failure 02/16/2019   Insomnia disorder 02/27/2018   Fatigue 02/27/2018   Fibromyalgia syndrome 04/30/2017   RLS (restless legs syndrome) 12/26/2016   B12 deficiency 12/26/2016   Vitamin D deficiency, unspecified 12/26/2016   GERD (gastroesophageal reflux disease) 12/26/2016   Anxiety disorder 12/26/2016   Hyponatremia 10/24/2016   AKI (acute kidney injury) 10/23/2016   Hypomagnesemia 10/22/2016   Hypophosphatemia 10/22/2016   Hypokalemia 10/21/2016   Wound infection after surgery 10/21/2016   Sepsis 10/21/2016   Myasthenia gravis 10/21/2016   Melanoma 10/21/2016   Crohn disease 10/21/2016   Cellulitis 10/21/2016   Palpitations 08/25/2013   Leukocytosis 05/25/2011   Hyperlipidemia 04/20/2007   Essential hypertension 04/20/2007   Asthma 04/20/2007   CARPAL TUNNEL SYNDROME, HX OF 04/20/2007   PCP:  [name], MD Pharmacy:   [facility] [address] - [place], [place] - [address] [place] [address] Phone: [phone] Fax: [phone]  [facility] [place], [place] - [address] [place] [address] Phone: [phone] Fax: [phone]     Social Determinants of Health (SDOH) Social History: SDOH Screenings   Food Insecurity: No Food Insecurity (07/10/2022)  Housing: Low Risk  (07/10/2022)  Transportation Needs: No Transportation Needs (07/10/2022)  Utilities: Not At Risk (07/10/2022)  Tobacco Use: Low Risk  (07/10/2022)   SDOH Interventions: Food Insecurity Interventions: Intervention Not Indicated Housing Interventions: Intervention Not Indicated Transportation Interventions: Intervention Not Indicated Utilities Interventions: Intervention Not Indicated   Readmission Risk Interventions    07/11/2022    1:25 PM  05/28/2020    4:29 PM  Readmission Risk Prevention Plan  Transportation Screening Complete Complete  PCP or Specialist Appt within 5-7 Days Complete   Home Care Screening Complete   Medication Review (RN CM) Complete   Medication Review (RN Care Manager)  Complete  HRI or Home Care Consult  Complete  SW Recovery Care/Counseling Consult  Complete  Palliative Care Screening  Not Applicable  Skilled Nursing Facility  Not Applicable

## 2022-07-11 NOTE — Evaluation (Signed)
Occupational Therapy Evaluation Patient Details Name: Teresa Lee MRN: [id number] DOB: 17-May-1960 Today's Date: 07/11/2022   History of Present Illness Patient is a 62 year old female who presented on 4/22 with weakness and fatigue.Patient was admitted with rhabdomyolysis. PMH: anxiety, osteoarthritis, asthma, crohn's disease, fibromyalgia, myasthenia gravis, PLIF back surgery, spinal cord stimulator insertion   Clinical Impression   Patient is a 62 year old female who was admitted for above. Patient was living at home with husband prior level with independence in ADLs with no AD. Patient currently is min guard with RW with unsteadiness noted patient is quick to fatigue with increased back pain with activity. Patient would continue to benefit from skilled OT services at this time while admitted and after d/c to address noted deficits in order to improve overall safety and independence in ADLs.       Recommendations for follow up therapy are one component of a multi-disciplinary discharge planning process, led by the attending physician.  Recommendations may be updated based on patient status, additional functional criteria and insurance authorization.   Assistance Recommended at Discharge Frequent or constant Supervision/Assistance  Patient can return home with the following A little help with walking and/or transfers;A little help with bathing/dressing/bathroom;Assistance with cooking/housework;Direct supervision/assist for medications management;Assist for transportation;Help with stairs or ramp for entrance;Direct supervision/assist for financial management    Functional Status Assessment  Patient has had a recent decline in their functional status and demonstrates the ability to make significant improvements in function in a reasonable and predictable amount of time.  Equipment Recommendations  None recommended by OT       Precautions / Restrictions Precautions Precautions:  Fall Restrictions Weight Bearing Restrictions: No      Mobility Bed Mobility Overal bed mobility: Needs Assistance Bed Mobility: Supine to Sit     Supine to sit: Supervision                    Balance Overall balance assessment: Needs assistance Sitting-balance support: Feet supported Sitting balance-Leahy Scale: Good     Standing balance support: During functional activity, Bilateral upper extremity supported, Reliant on assistive device for balance Standing balance-Leahy Scale: Poor                             ADL either performed or assessed with clinical judgement   ADL Overall ADL's : Needs assistance/impaired Eating/Feeding: Modified independent;Sitting   Grooming: Wash/dry face;Oral care;Supervision/safety;Standing Grooming Details (indicate cue type and reason): at sink with RW. back discomfort limiting ammount of time engage in task. Upper Body Bathing: Set up;Sitting   Lower Body Bathing: Min guard;Sit to/from stand;Sitting/lateral leans   Upper Body Dressing : Set up;Sitting   Lower Body Dressing: Min guard;Sitting/lateral leans;Sit to/from stand   Toilet Transfer: Min guard;Rolling walker (2 wheels);Ambulation Toilet Transfer Details (indicate cue type and reason): to transfer to recliner in room. patient reported walker height was appropriate for her at this time. patient declined to wash up and comb hair at this time. Toileting- Clothing Manipulation and Hygiene: Minimal assistance;Sit to/from stand                Pertinent Vitals/Pain Pain Assessment Pain Assessment: Faces Faces Pain Scale: Hurts a little bit Pain Location: back with prolonged activity Pain Descriptors / Indicators: Discomfort Pain Intervention(s): Repositioned, Limited activity within patient's tolerance, Monitored during session     Hand Dominance Right   Extremity/Trunk Assessment Upper Extremity Assessment Upper  Extremity Assessment: Defer to OT  evaluation   Lower Extremity Assessment Lower Extremity Assessment: Generalized weakness (AROM WFL, strength 3+/5 throughout bilaterally, denies numbness/tingling throughout, symmetrical)   Cervical / Trunk Assessment Cervical / Trunk Assessment: Normal   Communication Communication Communication: No difficulties   Cognition Arousal/Alertness: Awake/alert Behavior During Therapy: WFL for tasks assessed/performed Overall Cognitive Status: Within Functional Limits for tasks assessed         General Comments: patient's husband was present during session as well.                Home Living Family/patient expects to be discharged to:: Private residence Living Arrangements: Spouse/significant other Available Help at Discharge: Family;Available 24 hours/day Type of Home: House Home Access: Stairs to enter [employer] of Steps: 2 Entrance Stairs-Rails: None Home Layout: Able to live on main level with bedroom/bathroom;Two level     Bathroom Shower/Tub: Walk-in shower         Home Equipment: Agricultural consultant (2 wheels);Cane - single point;BSC/3in1;Grab bars - tub/shower;Tub bench          Prior Functioning/Environment Prior Level of Function : Independent/Modified Independent             Mobility Comments: pt reports ind at baseline using SPC occasionally; past 2 weeks pt with limited ambulation to restroom then back to bed ADLs Comments: pt reports ind at baseline with self care and household chores, spouse does the grocery shopping and yard works; past 2 weeks pt needing assistance with self care tasks from daughter due to weakness        OT Problem List: Decreased activity tolerance;Impaired balance (sitting and/or standing);Decreased coordination;Decreased safety awareness;Pain;Decreased knowledge of use of DME or AE      OT Treatment/Interventions: Self-care/ADL training;Energy conservation;Therapeutic exercise;DME and/or AE  instruction;Patient/family education;Therapeutic activities;Balance training    OT Goals(Current goals can be found in the care plan section) Acute Rehab OT Goals Patient Stated Goal: get better before [place] OT Goal Formulation: With patient/family Time For Goal Achievement: 07/25/22 Potential to Achieve Goals: Fair  OT Frequency: Min 2X/week       AM-PAC OT "6 Clicks" Daily Activity     Outcome Measure Help from another person eating meals?: A Little Help from another person taking care of personal grooming?: A Little Help from another person toileting, which includes using toliet, bedpan, or urinal?: A Little Help from another person bathing (including washing, rinsing, drying)?: A Little Help from another person to put on and taking off regular upper body clothing?: A Little Help from another person to put on and taking off regular lower body clothing?: A Little 6 Click Score: 18   End of Session Equipment Utilized During Treatment: Rolling walker (2 wheels) Nurse Communication: Mobility status  Activity Tolerance: Patient tolerated treatment well Patient left: in chair;with call bell/phone within reach;with family/visitor present  OT Visit Diagnosis: Unsteadiness on feet (R26.81);Other abnormalities of gait and mobility (R26.89);Muscle weakness (generalized) (M62.81)                Time: 1610-9604 OT Time Calculation (min): 16 min Charges:  OT General Charges $OT Visit: 1 Visit OT Evaluation $OT Eval Low Complexity: 1 Low  Deaven Barron OTR/L, MS Acute Rehabilitation Department Office# [phone]   Selinda Flavin 07/11/2022, 11:52 AM

## 2022-07-11 NOTE — Evaluation (Signed)
Physical Therapy Evaluation Patient Details Name: Teresa Lee MRN: [id number] DOB: 02/17/1961 Today's Date: 07/11/2022  History of Present Illness  Patient is a 62 year old female who presented on 4/22 with weakness and fatigue.Patient was admitted with rhabdomyolysis. PMH: anxiety, osteoarthritis, asthma, crohn's disease, fibromyalgia, myasthenia gravis, PLIF back surgery, spinal cord stimulator insertion  Clinical Impression  Pt admitted with above diagnosis. Pt from home with spouse, has become progressively weaker over the past 2 weeks needing assist with self care and limited ambulation with SPC in the home. Pt powers to stand with RW and min guard, cues for hand placement, slow to power up. Pt amb 70 ft with RW, increased lateral trunk sway and short steps, min A to min guard. Pt tolerates seated BLE strengthening exercises, reports BLE feel "heavy" but able to complete full AROM with LAQ and seated hip flexion marches. Educated pt on time OOB, seated exercises and amb often with nursing and therapy as able and pt verbalizes understanding. Pt currently with functional limitations due to the deficits listed below (see PT Problem List). Pt will benefit from acute skilled PT to increase their independence and safety with mobility to allow discharge.          Recommendations for follow up therapy are one component of a multi-disciplinary discharge planning process, led by the attending physician.  Recommendations may be updated based on patient status, additional functional criteria and insurance authorization.  Follow Up Recommendations       Assistance Recommended at Discharge Intermittent Supervision/Assistance  Patient can return home with the following  A little help with walking and/or transfers;A little help with bathing/dressing/bathroom;Assistance with cooking/housework;Assist for transportation;Help with stairs or ramp for entrance    Equipment Recommendations Rollator (4  wheels)  Recommendations for Other Services       Functional Status Assessment Patient has had a recent decline in their functional status and demonstrates the ability to make significant improvements in function in a reasonable and predictable amount of time.     Precautions / Restrictions Precautions Precautions: Fall Restrictions Weight Bearing Restrictions: No      Mobility  Bed Mobility Overal bed mobility: Needs Assistance Bed Mobility: Sit to Supine  Sit to supine: Supervision  General bed mobility comments: supv for safety to return to supine    Transfers Overall transfer level: Needs assistance Equipment used: Rolling walker (2 wheels) Transfers: Sit to/from Stand Sit to Stand: Min guard  General transfer comment: min guard to power up to stand, cues for hand placement    Ambulation/Gait Ambulation/Gait assistance: Min assist, Min guard Gait Distance (Feet): 70 Feet Assistive device: Rolling walker (2 wheels) Gait Pattern/deviations: Step-through pattern, Decreased stride length Gait velocity: decreased  General Gait Details: short steps with increased lateral weight shift and increased lateral trunk sway bilaterally, increased time and steps with turns, cues for RW management  Stairs            Wheelchair Mobility    Modified Rankin (Stroke Patients Only)       Balance Overall balance assessment: Needs assistance Sitting-balance support: Feet supported Sitting balance-Leahy Scale: Good  Standing balance support: During functional activity, Bilateral upper extremity supported, Reliant on assistive device for balance Standing balance-Leahy Scale: Poor       Pertinent Vitals/Pain Pain Assessment Pain Assessment: Faces Faces Pain Scale: Hurts a little bit Pain Location: R trap/cervical region Pain Descriptors / Indicators: Discomfort Pain Intervention(s): Limited activity within patient's tolerance, Monitored during session, Premedicated before  session,  Repositioned    Home Living Family/patient expects to be discharged to:: Private residence Living Arrangements: Spouse/significant other Available Help at Discharge: Family;Available 24 hours/day Type of Home: House Home Access: Stairs to enter Entrance Stairs-Rails: None Entrance Stairs-Number of Steps: 2   Home Layout: Able to live on main level with bedroom/bathroom;Two level Home Equipment: Agricultural consultant (2 wheels);Cane - single point;BSC/3in1;Grab bars - tub/shower;Tub bench      Prior Function Prior Level of Function : Independent/Modified Independent  Mobility Comments: pt reports ind at baseline using SPC occasionally; past 2 weeks pt with limited ambulation to restroom then back to bed ADLs Comments: pt reports ind at baseline with self care and household chores, spouse does the grocery shopping and yard works; past 2 weeks pt needing assistance with self care tasks from daughter due to weakness     Hand Dominance   Dominant Hand: Right    Extremity/Trunk Assessment   Upper Extremity Assessment Upper Extremity Assessment: Defer to OT evaluation    Lower Extremity Assessment Lower Extremity Assessment: Generalized weakness (AROM WFL, strength 3+/5 throughout bilaterally, denies numbness/tingling throughout, symmetrical)    Cervical / Trunk Assessment Cervical / Trunk Assessment: Normal  Communication   Communication: No difficulties  Cognition Arousal/Alertness: Awake/alert Behavior During Therapy: WFL for tasks assessed/performed Overall Cognitive Status: Within Functional Limits for tasks assessed  General Comments: spouse present assisting with PLOF and home setup, pt a&o x4        General Comments      Exercises General Exercises - Lower Extremity Long Arc Quad: Seated, AROM, Strengthening, Both, 5 reps Hip Flexion/Marching: Seated, AROM, Strengthening, Both, 5 reps   Assessment/Plan    PT Assessment Patient needs continued PT services  PT  Problem List Decreased strength;Decreased activity tolerance;Decreased balance;Decreased mobility;Decreased knowledge of use of DME;Decreased safety awareness;Pain       PT Treatment Interventions DME instruction;Gait training;Stair training;Functional mobility training;Therapeutic activities;Therapeutic exercise;Neuromuscular re-education;Patient/family education    PT Goals (Current goals can be found in the Care Plan section)  Acute Rehab PT Goals Patient Stated Goal: regain strength and endurance PT Goal Formulation: With patient Time For Goal Achievement: 07/25/22 Potential to Achieve Goals: Good    Frequency Min 1X/week     Co-evaluation               AM-PAC PT "6 Clicks" Mobility  Outcome Measure Help needed turning from your back to your side while in a flat bed without using bedrails?: A Little Help needed moving from lying on your back to sitting on the side of a flat bed without using bedrails?: A Little Help needed moving to and from a bed to a chair (including a wheelchair)?: A Little Help needed standing up from a chair using your arms (e.g., wheelchair or bedside chair)?: A Little Help needed to walk in hospital room?: A Little Help needed climbing 3-5 steps with a railing? : A Lot 6 Click Score: 17    End of Session Equipment Utilized During Treatment: Gait belt Activity Tolerance: Patient tolerated treatment well Patient left: in bed;with call bell/phone within reach;with bed alarm set Nurse Communication: Mobility status PT Visit Diagnosis: Unsteadiness on feet (R26.81);Other abnormalities of gait and mobility (R26.89);Muscle weakness (generalized) (M62.81)    Time: 2130-8657 PT Time Calculation (min) (ACUTE ONLY): 29 min   Charges:   PT Evaluation $PT Eval Low Complexity: 1 Low PT Treatments $Therapeutic Exercise: 8-22 mins         Tori Darcey Cardy PT, DPT 07/11/22, 11:02 AM

## 2022-07-11 NOTE — Plan of Care (Signed)

## 2022-07-12 DIAGNOSIS — M6019 Interstitial myositis, multiple sites: Secondary | ICD-10-CM | POA: Diagnosis not present

## 2022-07-12 DIAGNOSIS — M6282 Rhabdomyolysis: Secondary | ICD-10-CM | POA: Diagnosis not present

## 2022-07-12 LAB — COMPREHENSIVE METABOLIC PANEL
ALT: 193 U/L — ABNORMAL HIGH (ref 0–44)
AST: 153 U/L — ABNORMAL HIGH (ref 15–41)
Albumin: 2.8 g/dL — ABNORMAL LOW (ref 3.5–5.0)
Alkaline Phosphatase: 143 U/L — ABNORMAL HIGH (ref 38–126)
Anion gap: 11 (ref 5–15)
BUN: 40 mg/dL — ABNORMAL HIGH (ref 6–20)
CO2: 19 mmol/L — ABNORMAL LOW (ref 22–32)
Calcium: 9.2 mg/dL (ref 8.9–10.3)
Chloride: 108 mmol/L (ref 98–111)
Creatinine, Ser: 2.59 mg/dL — ABNORMAL HIGH (ref 0.44–1.00)
GFR, Estimated: 21 mL/min — ABNORMAL LOW (ref >60)
Glucose, Bld: 131 mg/dL — ABNORMAL HIGH (ref 70–99)
Potassium: 2.9 mmol/L — ABNORMAL LOW (ref 3.5–5.1)
Sodium: 138 mmol/L (ref 135–145)
Total Bilirubin: 0.9 mg/dL (ref 0.3–1.2)
Total Protein: 5.9 g/dL — ABNORMAL LOW (ref 6.5–8.1)

## 2022-07-12 LAB — CBC
HCT: 30.4 % — ABNORMAL LOW (ref 36.0–46.0)
Hemoglobin: 10.1 g/dL — ABNORMAL LOW (ref 12.0–15.0)
MCH: 32.5 pg (ref 26.0–34.0)
MCHC: 33.2 g/dL (ref 30.0–36.0)
MCV: 97.7 fL (ref 80.0–100.0)
Platelets: 181 10*3/uL (ref 150–400)
RBC: 3.11 MIL/uL — ABNORMAL LOW (ref 3.87–5.11)
RDW: 13.7 % (ref 11.5–15.5)
WBC: 11.2 10*3/uL — ABNORMAL HIGH (ref 4.0–10.5)
nRBC: 0 % (ref 0.0–0.2)

## 2022-07-12 LAB — TSH: TSH: 10.483 u[IU]/mL — ABNORMAL HIGH (ref 0.350–4.500)

## 2022-07-12 LAB — TROPONIN I (HIGH SENSITIVITY)
Troponin I (High Sensitivity): 24 ng/L — ABNORMAL HIGH (ref <18)
Troponin I (High Sensitivity): 19 ng/L — ABNORMAL HIGH (ref <18)

## 2022-07-12 LAB — MAGNESIUM: Magnesium: 1.6 mg/dL — ABNORMAL LOW (ref 1.7–2.4)

## 2022-07-12 LAB — CK: Total CK: 3927 U/L — ABNORMAL HIGH (ref 38–234)

## 2022-07-12 MED ORDER — SODIUM CHLORIDE 0.9 % IV SOLN
1000.0000 mg | INTRAVENOUS | Status: AC
  Administered 2022-07-12 – 2022-07-16 (×5): 1000 mg via INTRAVENOUS
  Filled 2022-07-12 (×5): qty 16

## 2022-07-12 MED ORDER — BUTALBITAL-APAP-CAFFEINE 50-325-40 MG PO TABS
1.0000 | ORAL_TABLET | Freq: Four times a day (QID) | ORAL | Status: DC | PRN
  Administered 2022-07-12 – 2022-07-14 (×3): 1 via ORAL
  Filled 2022-07-12 (×3): qty 1

## 2022-07-12 MED ORDER — POTASSIUM CHLORIDE CRYS ER 20 MEQ PO TBCR
40.0000 meq | EXTENDED_RELEASE_TABLET | Freq: Two times a day (BID) | ORAL | Status: AC
  Administered 2022-07-12 (×2): 40 meq via ORAL
  Filled 2022-07-12 (×2): qty 2

## 2022-07-12 MED ORDER — ACETAMINOPHEN 325 MG PO TABS
650.0000 mg | ORAL_TABLET | Freq: Four times a day (QID) | ORAL | Status: DC | PRN
  Administered 2022-07-12 – 2022-07-15 (×3): 650 mg via ORAL
  Filled 2022-07-12 (×3): qty 2

## 2022-07-12 MED ORDER — ONDANSETRON HCL 4 MG PO TABS
4.0000 mg | ORAL_TABLET | Freq: Three times a day (TID) | ORAL | Status: DC | PRN
  Administered 2022-07-12 – 2022-07-15 (×4): 4 mg via ORAL
  Filled 2022-07-12 (×4): qty 1

## 2022-07-12 NOTE — Progress Notes (Signed)
Unable to perform FVC Q6 due to unavailability of measuring tool. NIF will be completed Q6.

## 2022-07-12 NOTE — Progress Notes (Signed)
    Patient Name: Teresa Lee           DOB: 1961/01/26  MRN: [id number]      Admission Date: 07/10/2022  Attending Provider: Briant Cedar, MD  Primary Diagnosis: Rhabdomyolysis   Level of care: Progressive    CROSS COVER NOTE   Date of Service   07/12/2022   Teresa Lee, 62 y.o. female, was admitted on 07/10/2022 for Rhabdomyolysis.    HPI/Events of Note   Dr. Derry Lory (neurology) has requested patient be transferred to [facility] progressive unit as patient is at risk of going into myasthenia flare.  I spoke with patient at bedside regarding plan to transfer to [facility].  She has no questions or concerns at this time.  Patient is hemodynamically stable to transfer, no concerns of decompensation at this time.   Interventions/ Plan   Transfer to [facility]      Anthoney Harada, DNP, [facility]

## 2022-07-12 NOTE — Plan of Care (Signed)

## 2022-07-12 NOTE — Consult Note (Addendum)
NEUROLOGY CONSULTATION NOTE   Date of service: July 12, 2022 Patient Name: Teresa Lee MRN:  [id number] DOB:  05-29-1960 Reason for consult: "Generalized weakness, muscle aches." Requesting Provider: Briant Cedar, MD _ _ _   _ __   _ __ _ _  __ __   _ __   __ _  History of Present Illness  Teresa Lee is a 62 y.o. female with PMH significant for myasthenia gravis on as needed Mestinon and CellCept 1000 twice daily, fibromyalgia, GERD, hypertension, hyperlipidemia who is currently admitted to the hospital with generalized weakness and muscle aches.  She was found to have rhabdomyolysis and AKI.  She reports that she has had muscle soreness, generalized weakness that has been gradually progressive over the last few months.  Initially noted it would hurt to move and getting out of a chair or going up stairs were specifically very difficult.  She feels that around [place], her symptoms have gotten much more noticeable at work.  She initially did not do a whole lot about the symptoms that she attributed these to her fibromyalgia and into her myasthenia gravis.  She reports that her typical myasthenia gravis flareup is droopy eyelids, difficulty swallowing, slurring of her speech.  She reports that she is not experiencing any of the symptoms at this time.  She reports that she takes Mestinon as needed only.  She is compliant with her CellCept 1000 twice daily.  She also reports that she has been on rosuvastatin for the last few years without any issues.  She denies any viral prodrome or symptoms around the time of onset of these muscle aches and pain and generalized weakness.  She reported that she had to use a cane when walking and had recently switched over to a walker.   She reports minimal if any improvement in her weakness and pain since she has been in the hospital.  She feels that her nausea is much better.  In the ED, CK was noted to be elevated to 8200 on  presentation.  This is trended down with fluids.  She was also noted to be in AKI with creatinine of 2.76.  This has come down to 2.6.   ROS   Constitutional Denies weight loss, fever and chills.   HEENT Denies changes in vision and hearing.   Respiratory Denies SOB and cough.   CV Denies palpitations and CP   GI Denies abdominal pain, nausea, vomiting and diarrhea.   GU Denies dysuria and urinary frequency.   MSK + myalgia and joint pain.   Skin Denies rash and pruritus.   Neurological Denies headache and syncope.   Psychiatric Denies recent changes in mood. Denies anxiety and depression.    Past History   Past Medical History:  Diagnosis Date   Anxiety    Arthritis    osteoarthritis   Asthma    Blind left eye    Cancer    skin   Crohn's disease    CTS (carpal tunnel syndrome)    DJD (degenerative joint disease)    Both cervical spine and LS spine   Fibromyalgia    GERD (gastroesophageal reflux disease)    Heart murmur    Herniated nucleus pulposus    Hyperlipidemia    Hypertension    IBS (irritable bowel syndrome)    Leukocytosis    Myasthenia gravis    Myasthenia gravis    Myasthenia gravis    Pneumonia    Sleep apnea  Spinal cord stimulator status    Past Surgical History:  Procedure Laterality Date   BACK SURGERY     BIOPSY  05/24/2020   Procedure: BIOPSY;  Surgeon: Bernette Redbird, MD;  Location: [facility] ENDOSCOPY;  Service: Endoscopy;;   BREAST REDUCTION SURGERY     CARPAL TUNNEL RELEASE     bilateral   cervical disc inj     CHOLECYSTECTOMY     COLONOSCOPY WITH PROPOFOL N/A 05/24/2020   Procedure: COLONOSCOPY WITH PROPOFOL;  Surgeon: Bernette Redbird, MD;  Location: [facility] ENDOSCOPY;  Service: Endoscopy;  Laterality: N/A;   ENDOMETRIAL ABLATION W/ NOVASURE     ESOPHAGOGASTRODUODENOSCOPY (EGD) WITH PROPOFOL N/A 05/24/2020   Procedure: ESOPHAGOGASTRODUODENOSCOPY (EGD) WITH PROPOFOL;  Surgeon: Bernette Redbird, MD;  Location: [facility] ENDOSCOPY;  Service: Endoscopy;   Laterality: N/A;   LUMBAR LAMINECTOMY/DECOMPRESSION MICRODISCECTOMY Right 05/04/2020   Procedure: Right Lumbar Five Sacral One Microdiscectomy;  Surgeon: Maeola Harman, MD;  Location: [facility] OR;  Service: Neurosurgery;  Laterality: Right;  posterior   MELANOMA EXCISION     NASAL SINUS SURGERY     SPINAL CORD STIMULATOR INSERTION  2019   SPINAL CORD STIMULATOR INSERTION N/A 10/18/2021   Procedure: REMOVAL OF  SPINAL CORD STIMULATOR;  Surgeon: Dawley, Alan Mulder, DO;  Location: [facility] OR;  Service: Neurosurgery;  Laterality: N/A;  3C   THYMECTOMY     due to myastenia gravis   THYMECTOMY     TRANSFORAMINAL LUMBAR INTERBODY FUSION (TLIF) WITH PEDICLE SCREW FIXATION 1 LEVEL Right 05/04/2020   Procedure: Right Lumbar Four-Five Transforaminal lumbar interbody fusion;  Surgeon: Maeola Harman, MD;  Location: [facility] OR;  Service: Neurosurgery;  Laterality: Right;  posterior   Family History  Problem Relation Age of Onset   Asthma Daughter    Heart disease Mother    Cancer Mother    Hyperlipidemia Mother    Hypertension Mother    Diabetes Father    Social History   Socioeconomic History   Marital status: Married    Spouse name: Not on file   Number of children: 1   Years of education: Not on file   Highest education level: Not on file  Occupational History   Occupation: disabled  Tobacco Use   Smoking status: Never   Smokeless tobacco: Never  Vaping Use   Vaping Use: Never used  Substance and Sexual Activity   Alcohol use: Yes    Comment: occassional wine   Drug use: No   Sexual activity: Not on file  Other Topics Concern   Not on file  Social History Narrative   Not on file   Social Determinants of Health   Financial Resource Strain: Not on file  Food Insecurity: No Food Insecurity (07/10/2022)   Hunger Vital Sign    Worried About Running Out of Food in the Last Year: Never true    Ran Out of Food in the Last Year: Never true  Transportation Needs: No Transportation Needs (07/10/2022)    PRAPARE - Administrator, Civil Service (Medical): No    Lack of Transportation (Non-Medical): No  Physical Activity: Not on file  Stress: Not on file  Social Connections: Not on file   Allergies  Allergen Reactions   Duloxetine Nausea Only and Other (See Comments)    Cymbalta    Latex Other (See Comments)    BLISTERS   Lorazepam Other (See Comments)    Hallucinations   Orange Concentrate [Flavoring Agent] Other (See Comments)    Acid reflux   Other  Hives, Nausea Only, Rash and Other (See Comments)    Some nuts cause RASHES   Percocet [Oxycodone-Acetaminophen] Hives   Sulfa Antibiotics Hives   Tape Other (See Comments)    BLISTERS and BURNS THE SKIN   Bioflavonoids Rash    Causes burning of skin and blisters   Cefixime Other (See Comments)    Due to myasthenia gravis   Nucynta [Tapentadol] Other (See Comments)    Headache     Medications   Medications Prior to Admission  Medication Sig Dispense Refill Last Dose   albuterol (PROVENTIL) (2.5 MG/3ML) 0.083% nebulizer solution Take 2.5 mg by nebulization every 6 (six) hours as needed for shortness of breath.   unk   albuterol (VENTOLIN HFA) 108 (90 Base) MCG/ACT inhaler 1 PUFF EVERY 6 HOURS AS NEEDED FOR WHEEZING (Patient taking differently: Inhale 1 puff into the lungs every 6 (six) hours as needed for wheezing or shortness of breath.) 18 g 2 Past Month   cyanocobalamin (,VITAMIN B-12,) 1000 MCG/ML injection Inject 1,000 mcg into the muscle See admin instructions. Inject 1,000 mcg into the muscle every 6-8 weeks   Past Month   desmopressin (DDAVP) 0.2 MG tablet Take 0.6 mg by mouth at bedtime.   07/09/2022 at pm   estradiol (ESTRACE) 2 MG tablet Take 2 mg by mouth at bedtime.   07/09/2022 at pm   HYDROcodone-acetaminophen (NORCO) 10-325 MG tablet Take 1 tablet by mouth 5 (five) times daily as needed for moderate pain.   07/10/2022 at am   losartan (COZAAR) 50 MG tablet Take 50 mg by mouth daily.   07/10/2022 at am    meloxicam (MOBIC) 15 MG tablet Take 15 mg by mouth daily.   07/10/2022 at am   Mesalamine (ASACOL) 400 MG CPDR DR capsule Take 800 mg by mouth in the morning and at bedtime.   07/09/2022 at pm   methocarbamol (ROBAXIN) 750 MG tablet Take 750 mg by mouth at bedtime.   07/09/2022 at pm   metoprolol succinate (TOPROL-XL) 25 MG 24 hr tablet Take 25 mg by mouth at bedtime.   07/09/2022 at 2100   mycophenolate (CELLCEPT) 500 MG tablet Take 1,000 mg by mouth in the morning and at bedtime.   07/09/2022 at pm   naloxone [facility]) nasal spray 4 mg/0.1 mL Place 1 spray into the nose once as needed (opioid overdose).   EXPIRED   ondansetron (ZOFRAN) 4 MG tablet Take 4 mg by mouth every 8 (eight) hours as needed for nausea or vomiting.   unk   pantoprazole (PROTONIX) 40 MG tablet TAKE 1 TABLET DAILY (Patient taking differently: Take 40 mg by mouth daily before breakfast.) 90 tablet 3 07/10/2022 at am   potassium chloride SA (KLOR-CON M) 20 MEQ tablet Take 20 mEq by mouth at bedtime.   07/09/2022 at pm   predniSONE (DELTASONE) 10 MG tablet Take 10 mg by mouth daily with breakfast.   07/10/2022 at am   pregabalin (LYRICA) 75 MG capsule Take 75 mg by mouth at bedtime.   07/09/2022 at pm   progesterone (PROMETRIUM) 100 MG capsule Take 100 mg by mouth at bedtime.   07/09/2022 at pm   pyridostigmine (MESTINON) 60 MG tablet Take 30 mg by mouth 3 (three) times daily as needed (for Myasthenia gravis symtoms).   unk   rOPINIRole (REQUIP) 1 MG tablet Take 1-2 tablets (1-2 mg total) by mouth at bedtime. (Patient taking differently: Take 1 mg by mouth See admin instructions. Take 1 tablet (1 mg)  by mouth daily at bedtime and may take an additional 1 mg later if still needed for restless legs) 180 tablet 1 07/09/2022 at pm 1-2   SYMBICORT 80-4.5 MCG/ACT inhaler Inhale 2 puffs into the lungs 2 (two) times daily. (Patient taking differently: Inhale 2 puffs into the lungs 2 (two) times daily as needed (shortness of breath).) 10.2 g 6 unk    Vitamin D, Ergocalciferol, (DRISDOL) 1.25 MG (50000 UNIT) CAPS capsule Take 50,000 Units by mouth every 14 (fourteen) days.   Past Month   loperamide (IMODIUM) 2 MG capsule Take 2 capsules (4 mg total) by mouth as needed for diarrhea or loose stools. (Patient not taking: Reported on 07/10/2022) 30 capsule 0 Not Taking   rosuvastatin (CRESTOR) 40 MG tablet Take 1 tablet (40 mg total) by mouth daily. (Patient not taking: Reported on 07/10/2022) 90 tablet 2 Not Taking   Spacer/Aero-Holding Chambers (AEROCHAMBER PLUS) inhaler Use as instructed to use with inahaler. 1 each 1      Vitals   Vitals:   07/11/22 1242 07/11/22 2016 07/12/22 0315 07/12/22 1416  BP: (!) 157/75 (!) 147/71 (!) 141/77 (!) 157/68  Pulse: 75 85 69 77  Resp: Temp: 99 F (37.2 C) 98.5 F (36.9 C) 98 F (36.7 C) 98.1 F (36.7 C)  TempSrc: Oral Oral Oral Oral  SpO2: 100% 96% 98% 99%  Weight:      Height:         Body mass index is 31.39 kg/m.  Physical Exam   General: Laying comfortably in bed; in no acute distress.  HENT: Normal oropharynx and mucosa. Normal external appearance of ears and nose.  Neck: Supple, no pain or tenderness  CV: No JVD. No peripheral edema.  Pulmonary: Symmetric Chest rise. Normal respiratory effort.  Abdomen: Soft to touch, non-tender.  Ext: No cyanosis, edema, or deformity  Skin: No rash. Normal palpation of skin.   Musculoskeletal: Normal digits and nails by inspection. No clubbing.   Neurologic Examination  Mental status/Cognition: Alert, oriented to self, place, month and year, good attention.  Speech/language: Fluent, comprehension intact, object naming intact, repetition intact.  Cranial nerves:   CN II Pupils equal and reactive to light, no VF deficits    CN III,IV,VI EOM intact, no gaze preference or deviation, no nystagmus    CN V normal sensation in V1, V2, and V3 segments bilaterally    CN VII no asymmetry, no nasolabial fold flattening    CN VIII normal  hearing to speech    CN IX & X normal palatal elevation, no uvular deviation    CN XI 5/5 head turn and 5/5 shoulder shrug bilaterally    CN XII midline tongue protrusion    Motor:  Muscle bulk: normal, tone normal, pronator drift none Mvmt Root Nerve  Muscle Right Left Comments  SA C5/6 Ax Deltoid 4+ 4+   EF C5/6 Mc Biceps 5 5   EE C6/7/8 Rad Triceps 5 5   WF C6/7 Med FCR     WE C7/8 PIN ECU     F Ab C8/T1 U ADM/FDI 5 5   HF L1/2/3 Fem Illopsoas 4 4   KE L2/3/4 Fem Quad 4+ 4+   DF L4/5 D Peron Tib Ant 5 5   PF S1/2 Tibial Grc/Sol 5 5    Reflexes:  Right Left Comments  Pectoralis      Biceps (C5/6) 2 2   Brachioradialis (C5/6) 2 2    Triceps (C6/7)  2 2    Patellar (L3/4) 2 2    Achilles (S1)      Hoffman      Plantar     Jaw jerk    Sensation:  Light touch Intact throughout   Pin prick    Temperature    Vibration   Proprioception    Coordination/Complex Motor:  - Finger to Nose intact bilaterally - Heel to shin unable to do due to pain. - Rapid alternating movement are slowed. - Gait: Deferred for patient's safety. Labs   CBC:  Recent Labs  Lab 07/10/22 1252 07/10/22 1755 07/11/22 0643 07/12/22 0545  WBC 17.9*   < > 13.1* 11.2*  NEUTROABS 13.5*  --   --   --   HGB 11.8*   < > 9.7* 10.1*  HCT 34.9*   < > 29.6* 30.4*  MCV 93.6   < > 97.7 97.7  PLT 227   < > 179 181   < > = values in this interval not displayed.    Basic Metabolic Panel:  Lab Results  Component Value Date   NA 138 07/12/2022   K 2.9 (L) 07/12/2022   CO2 19 (L) 07/12/2022   GLUCOSE 131 (H) 07/12/2022   BUN 40 (H) 07/12/2022   CREATININE 2.59 (H) 07/12/2022   CALCIUM 9.2 07/12/2022   GFRNONAA 21 (L) 07/12/2022   GFRAA >60 02/25/2019   Lipid Panel:  Lab Results  Component Value Date   LDLCALC 162 (H) 08/08/2013   HgbA1c:  Lab Results  Component Value Date   HGBA1C 5.7 (H) 02/22/2019   Urine Drug Screen: No results found for: "LABOPIA", "COCAINSCRNUR", "LABBENZ",  "AMPHETMU", "THCU", "LABBARB"  Alcohol Level No results found for: "ETH"  CT Head without contrast(Personally reviewed): 1.  No evidence of an acute intracranial abnormality. 2. Mild cerebral white matter disease, nonspecific but most often secondary to chronic small vessel ischemia. 3. Redemonstrated prominent perivascular space versus chronic lacunar infarct within the right external capsule/basal ganglia. 4. Unchanged from prior examinations, there is focal irregularity and dilation of the anterior body of the right lateral ventricle. Although not definitively identified, closed lip schizencephaly cannot be excluded. Consider a non-emergent brain MRI for further evaluation (if not already performed at an outside institution). 5. Absence of the septum pellucidum.  Lab Results  Component Value Date   CKTOTAL 3,927 (H) 07/12/2022   Impression   Tonnya Garbett is a 62 y.o. female with PMH significant for myasthenia gravis on as needed Mestinon and CellCept 1000 twice daily, fibromyalgia, GERD, hypertension, hyperlipidemia who is currently admitted to the hospital with generalized weakness and muscle aches.  She was found to have rhabdomyolysis and AKI.  Overall, presentation is concerning for idiopathic inflammatory myositis. No skin lesions concerning for dermatomyositis. Could be statin induced myositis, polymyositis, inclusion body myositis, antisynthetase syndrome, myositis secondary to rheum disorder or overlap syndrome. No viral symptoms around the time of onset of these symptoms that patient can recall and thus not particularly concerning for viral myositis.  I think she is stable from a myasthenia standpoint and is not experiencing her typical MG symptoms.  Recommendations  - IV solumedrol 1000mg  daily x 5 doses. Did consider IVIG but can't do it due to ARF/AKI from rhabdo. - ANA IFA, ENA panel. - Serum Myomarker 3 plus panel to look for myositis associated, myositis  specific antibodies and  - MRI left femur with and without contrast to evaluate for muscle edema and inflammation -Hospitalist team to consult  General Surgery for muscle biopsy after MRI. MRI L femur would be helpful in identifying appropriate site of muscle biopsy. - Cardiac Monitoring. - troponin x 2 to look for myocarditis. - Every 6 hours hours NIFs and VC - Recommend elective intubation for respiratory compromise if VC falls below 15 to 20 mL/kg and or NIFs falls below -20cm/H2O. If poor effort and not sure, can always get ABG to assess for CO2 retention. Elective intubation for airway cmopromise if she has difficulty clearing her secretions. Oxygen saturation should not be used to make decision regarding intubation. - Recommend Mestinon PO 30mg  TID PRN. If unable to swallow, can switch from PO to IV.(30mg  PO is equivalent to 1mg  IV). She rarely takes it at home. - continue Cellcept 1000mg  BID. - recommend monitoring on progressive floor for the next 2 days. Steroids can temporarily worsen Myasthenia gravis. - Medications that may worsen or trigger MG exacerbation: Class IA antiarrhythmics, magnesium, flouroquinolones, macrolides, aminoglycosides, penicillamine, curare, interferon alpha, botox, quinine. Use with caution: calcium channel blocker, beta blockers and statins. - I have added statins to her allergy list. She should never be started on statins again. ______________________________________________________________________   Thank you for the opportunity to take part in the care of this patient. If you have any further questions, please contact the neurology consultation attending.  Signed,  Erick Blinks [facility] Pager Number [phone] _ _ _   _ __   _ __ _ _  __ __   _ __   __ _

## 2022-07-12 NOTE — Progress Notes (Signed)
Patient transferred to 1439 with all belongings including multiple bags, cell phone, tablet, and red walker. Report was called to accepting RN. IV in right forearm with methylprednisolone sodium succinate running. LR was stopped for solu-medrol infusion as it is incompatible.   Smiley Houseman, RN

## 2022-07-12 NOTE — Progress Notes (Signed)
NIF (best of 3 attempts)= All 3 attempts resulted in + -40.

## 2022-07-12 NOTE — Progress Notes (Signed)
  Progress Note   Patient: Teresa Lee ZOX:[id number] DOB: Jul 30, 1960 DOA: 07/10/2022     2 DOS: the patient was seen and examined on 07/12/2022   Brief hospital course: 62 y.o. female with medical history significant of OA, HTN, HLD on statin, MG who presented to ED with fatigue and generalized weakness, prompting visit to [facility] ED. Work up confirmed ARF with an elevated CK in excess of 8700. Pt was started on IVF and admitted to [facility] for further work up. Pt had reported generalized weakness and generalized muscle aches that started around 2wks PTA. No trauma. No new medications. Weakness and muslce aches became worse, prompting ED visit.  Patient admitted for further management.    Assessment and Plan:  Rhabdomyolysis possible from ?myositis, idiopathic inflammatory versus statin induced Known history of myasthenia gravis-currently with no suggestions of flare Presented with CK of 8700, trending downwards Due to patient not improving, neurology consulted, appreciate recommendations Inflammatory markers to confirm myositis, pending Troponin to look for myocarditis Patient has been complaining of some left hip pain, MRI left femur pending to evaluate for muscle edema and inflammation.  MRI may also be needed for possible sites of muscle biopsy by general surgery if noted to be inflamed Neurology recommending high-dose steroids, IV Solu-Medrol 1000 mg daily x 5 doses Due to the high dues steroids, there is a risk of temporarily worsening myasthenia gravis, recommend NIFs and VC every 6 hours, if worsening, elective intubation recommended Continue CellCept, Mestinon p.o. 30 mg 3 times daily as needed Avoid medications that may worsen or trigger MG exacerbation PT/OT consulted with recs for HHPT/OT at time of d/c Follow CK trends Monitor closely in progressive unit  Elevated LFTs Elevated AST/ALT likely 2/2 rhabdo Daily CMP   AKI Secondary to rhabdo, poor oral intake Continue  IVF Holding ARB Daily CMP  Leukocytosis Improving with ??hydration Daily CBC  Hypokalemia  Hypomagnesemia  Replace prn Unable to replace magnesium as it may trigger MS crisis   Elevated TSH TSH 10.4 Free T4 pending   HLD Discontinue indefinitely   HTN BP stable at this time Cont metoprolol holding ARB given ARF  R hip pain Suspect related to above rhabdo hip xray, no fractures  Obesity Lifestyle modification advised      Subjective: Reports generalized weakness, headaches, muscle aches, with no improvement    Physical Exam: Vitals:   07/11/22 1242 07/11/22 2016 07/12/22 0315 07/12/22 1416  BP: (!) 157/75 (!) 147/71 (!) 141/77 (!) 157/68  Pulse: 75 85 69 77  Resp: Temp: 99 F (37.2 C) 98.5 F (36.9 C) 98 F (36.7 C) 98.1 F (36.7 C)  TempSrc: Oral Oral Oral Oral  SpO2: 100% 96% 98% 99%  Weight:      Height:       General: NAD, acutely ill appearing  Cardiovascular: S1, S2 present Respiratory: CTAB Abdomen: Soft, nontender, nondistended, bowel sounds present Musculoskeletal: No bilateral pedal edema noted Skin: Normal Psychiatry: Normal mood      Family Communication: Discussed with husband extensively  Disposition: Status is: Inpatient Remains inpatient appropriate because: Severity of illness  Planned Discharge Destination: Home with Home Health   Author: Briant Cedar, MD 07/12/2022 5:34 PM  For on call review [email or web address].

## 2022-07-12 NOTE — Progress Notes (Signed)
NIF -40  Pt gave great effort

## 2022-07-12 NOTE — Progress Notes (Signed)
PT Cancellation Note  Patient Details Name: Teresa Lee MRN: [id number] DOB: 09-07-1960   Cancelled Treatment:    Reason Eval/Treat Not Completed: Pain limiting ability to participate (pt reports she has a bad headache and nausea at present and isn't able to tolerate mobility. Will follow.)   Tamala Ser PT 07/12/2022  Acute Rehabilitation Services  Office [phone]

## 2022-07-13 ENCOUNTER — Inpatient Hospital Stay (HOSPITAL_COMMUNITY): Payer: Medicare HMO

## 2022-07-13 ENCOUNTER — Encounter (HOSPITAL_COMMUNITY): Payer: Self-pay | Admitting: Internal Medicine

## 2022-07-13 DIAGNOSIS — E119 Type 2 diabetes mellitus without complications: Secondary | ICD-10-CM

## 2022-07-13 DIAGNOSIS — G7000 Myasthenia gravis without (acute) exacerbation: Secondary | ICD-10-CM

## 2022-07-13 DIAGNOSIS — M6282 Rhabdomyolysis: Secondary | ICD-10-CM | POA: Diagnosis not present

## 2022-07-13 DIAGNOSIS — N179 Acute kidney failure, unspecified: Secondary | ICD-10-CM

## 2022-07-13 DIAGNOSIS — M6019 Interstitial myositis, multiple sites: Secondary | ICD-10-CM | POA: Diagnosis not present

## 2022-07-13 HISTORY — DX: Type 2 diabetes mellitus without complications: E11.9

## 2022-07-13 LAB — HEMOGLOBIN A1C
Hgb A1c MFr Bld: 8.1 % — ABNORMAL HIGH (ref 4.8–5.6)
Mean Plasma Glucose: 185.77 mg/dL

## 2022-07-13 LAB — GLUCOSE, CAPILLARY
Glucose-Capillary: 337 mg/dL — ABNORMAL HIGH (ref 70–99)
Glucose-Capillary: 332 mg/dL — ABNORMAL HIGH (ref 70–99)

## 2022-07-13 LAB — CBC WITH DIFFERENTIAL/PLATELET
Abs Immature Granulocytes: 0.39 10*3/uL — ABNORMAL HIGH (ref 0.00–0.07)
Basophils Absolute: 0 10*3/uL (ref 0.0–0.1)
Basophils Relative: 0 %
Eosinophils Absolute: 0 10*3/uL (ref 0.0–0.5)
Eosinophils Relative: 0 %
HCT: 26.9 % — ABNORMAL LOW (ref 36.0–46.0)
Hemoglobin: 9.6 g/dL — ABNORMAL LOW (ref 12.0–15.0)
Immature Granulocytes: 4 %
Lymphocytes Relative: 5 %
Lymphs Abs: 0.5 10*3/uL — ABNORMAL LOW (ref 0.7–4.0)
MCH: 32 pg (ref 26.0–34.0)
MCHC: 35.7 g/dL (ref 30.0–36.0)
MCV: 89.7 fL (ref 80.0–100.0)
Monocytes Absolute: 0.1 10*3/uL (ref 0.1–1.0)
Monocytes Relative: 1 %
Neutro Abs: 9.9 10*3/uL — ABNORMAL HIGH (ref 1.7–7.7)
Neutrophils Relative %: 90 %
Platelets: 166 10*3/uL (ref 150–400)
RBC: 3 MIL/uL — ABNORMAL LOW (ref 3.87–5.11)
RDW: 13.2 % (ref 11.5–15.5)
WBC: 10.9 10*3/uL — ABNORMAL HIGH (ref 4.0–10.5)
nRBC: 0 % (ref 0.0–0.2)

## 2022-07-13 LAB — COMPREHENSIVE METABOLIC PANEL
ALT: 179 U/L — ABNORMAL HIGH (ref 0–44)
AST: 94 U/L — ABNORMAL HIGH (ref 15–41)
Albumin: 2.5 g/dL — ABNORMAL LOW (ref 3.5–5.0)
Alkaline Phosphatase: 130 U/L — ABNORMAL HIGH (ref 38–126)
Anion gap: 14 (ref 5–15)
BUN: 33 mg/dL — ABNORMAL HIGH (ref 6–20)
CO2: 17 mmol/L — ABNORMAL LOW (ref 22–32)
Calcium: 9.1 mg/dL (ref 8.9–10.3)
Chloride: 104 mmol/L (ref 98–111)
Creatinine, Ser: 2.9 mg/dL — ABNORMAL HIGH (ref 0.44–1.00)
GFR, Estimated: 18 mL/min — ABNORMAL LOW (ref >60)
Glucose, Bld: 325 mg/dL — ABNORMAL HIGH (ref 70–99)
Potassium: 4 mmol/L (ref 3.5–5.1)
Sodium: 135 mmol/L (ref 135–145)
Total Bilirubin: 0.9 mg/dL (ref 0.3–1.2)
Total Protein: 5.5 g/dL — ABNORMAL LOW (ref 6.5–8.1)

## 2022-07-13 LAB — CK: Total CK: 2648 U/L — ABNORMAL HIGH (ref 38–234)

## 2022-07-13 LAB — T4, FREE: Free T4: 0.66 ng/dL (ref 0.61–1.12)

## 2022-07-13 MED ORDER — MORPHINE SULFATE (PF) 2 MG/ML IV SOLN
1.0000 mg | INTRAVENOUS | Status: DC | PRN
  Administered 2022-07-14: 2 mg via INTRAVENOUS
  Filled 2022-07-13: qty 1

## 2022-07-13 MED ORDER — ROPINIROLE HCL 1 MG PO TABS
0.5000 mg | ORAL_TABLET | Freq: Every day | ORAL | Status: DC
  Administered 2022-07-13 – 2022-07-18 (×6): 0.5 mg via ORAL
  Filled 2022-07-13 (×6): qty 1

## 2022-07-13 MED ORDER — GADOBUTROL 1 MMOL/ML IV SOLN
7.0000 mL | Freq: Once | INTRAVENOUS | Status: AC | PRN
  Administered 2022-07-13: 7 mL via INTRAVENOUS

## 2022-07-13 MED ORDER — PREGABALIN 75 MG PO CAPS
75.0000 mg | ORAL_CAPSULE | Freq: Every day | ORAL | Status: DC
  Administered 2022-07-13 – 2022-07-18 (×6): 75 mg via ORAL
  Filled 2022-07-13 (×6): qty 1

## 2022-07-13 MED ORDER — INSULIN ASPART 100 UNIT/ML IJ SOLN
0.0000 [IU] | Freq: Three times a day (TID) | INTRAMUSCULAR | Status: DC
  Administered 2022-07-13: 5 [IU] via SUBCUTANEOUS
  Administered 2022-07-13: 11 [IU] via SUBCUTANEOUS

## 2022-07-13 MED ORDER — INSULIN ASPART 100 UNIT/ML IJ SOLN
0.0000 [IU] | Freq: Three times a day (TID) | INTRAMUSCULAR | Status: DC
  Administered 2022-07-13: 11 [IU] via SUBCUTANEOUS
  Administered 2022-07-14: 5 [IU] via SUBCUTANEOUS
  Administered 2022-07-14: 8 [IU] via SUBCUTANEOUS
  Administered 2022-07-14: 11 [IU] via SUBCUTANEOUS
  Administered 2022-07-14 – 2022-07-15 (×2): 8 [IU] via SUBCUTANEOUS
  Administered 2022-07-15: 15 [IU] via SUBCUTANEOUS
  Administered 2022-07-15 (×2): 11 [IU] via SUBCUTANEOUS
  Administered 2022-07-16: 8 [IU] via SUBCUTANEOUS
  Administered 2022-07-16: 5 [IU] via SUBCUTANEOUS
  Administered 2022-07-16: 8 [IU] via SUBCUTANEOUS

## 2022-07-13 MED ORDER — HYDROCODONE-ACETAMINOPHEN 5-325 MG PO TABS
1.0000 | ORAL_TABLET | Freq: Four times a day (QID) | ORAL | Status: DC | PRN
  Administered 2022-07-13 – 2022-07-19 (×9): 1 via ORAL
  Filled 2022-07-13 (×10): qty 1

## 2022-07-13 MED ORDER — HYDRALAZINE HCL 25 MG PO TABS
25.0000 mg | ORAL_TABLET | Freq: Four times a day (QID) | ORAL | Status: DC | PRN
  Administered 2022-07-13 – 2022-07-14 (×2): 25 mg via ORAL
  Filled 2022-07-13: qty 1

## 2022-07-13 NOTE — Care Management Important Message (Signed)
Important Message  Patient Details  Name: Teresa Lee MRN: [id number] Date of Birth: 12/15/1960   Medicare Important Message Given:  Yes     Sukanya Goldblatt Stefan Church 07/13/2022, 2:47 PM

## 2022-07-13 NOTE — Progress Notes (Signed)
NIF -30 

## 2022-07-13 NOTE — TOC Progression Note (Signed)
Transition of Care [facility]) - Progression Note    Patient Details  Name: Teresa Lee MRN: [id number] Date of Birth: 10-Jun-1960  Transition of Care [facility]) CM/SW Contact  Gordy Clement, RN Phone Number: 07/13/2022, 3:38 PM  Clinical Narrative:     Home Health PT and OT orders are entered. [facility] will provide services Rollator will be delivered by [employer]  AVS updated  TOC will continue to follow patient for any additional discharge needs     Expected Discharge Plan: Home w Home Health Services Barriers to Discharge: No Barriers Identified  Expected Discharge Plan and Services In-house Referral: Clinical Social Work Discharge Planning Services: NA Post Acute Care Choice: Home Health, Durable Medical Equipment Living arrangements for the past 2 months: Single Family Home                 DME Arranged: Walker rolling with seat DME Agency: [employer] Date DME Agency Contacted: 07/11/22 Time DME Agency Contacted: 1326 Representative spoke with at DME Agency: Vaughan Basta HH Arranged: PT HH Agency: [facility] Date [facility] Agency Contacted: 07/12/22 Time HH Agency Contacted: 1326 Representative spoke with at [facility] Agency: Marylene Land   Social Determinants of Health (SDOH) Interventions SDOH Screenings   Food Insecurity: No Food Insecurity (07/10/2022)  Housing: Low Risk  (07/10/2022)  Transportation Needs: No Transportation Needs (07/10/2022)  Utilities: Not At Risk (07/10/2022)  Tobacco Use: Low Risk  (07/13/2022)    Readmission Risk Interventions    07/11/2022    1:25 PM 05/28/2020    4:29 PM  Readmission Risk Prevention Plan  Transportation Screening Complete Complete  PCP or Specialist Appt within 5-7 Days Complete   Home Care Screening Complete   Medication Review (RN CM) Complete   Medication Review (RN Care Manager)  Complete  HRI or Home Care Consult  Complete  SW Recovery Care/Counseling Consult  Complete  Palliative Care Screening  Not  Applicable  Skilled Nursing Facility  Not Applicable

## 2022-07-13 NOTE — Progress Notes (Addendum)
PROGRESS NOTE  Teresa Lee WUX:[id number] DOB: 12/02/1960   PCP: Teresa Inch, MD  Patient is from: Home.  Reports using rolling walker at baseline.  Lately deconditioned  DOA: 07/10/2022 LOS: 3  Chief complaints Chief Complaint  Patient presents with   Weakness     Brief Narrative / Interim history: 62 year old F with PMH of OA, HTN, HLD on statin, MG who presented to [facility] ED with fatigue and generalized weakness for about 2 weeks and admitted to [facility] with nontraumatic rhabdomyolysis, AKI and elevated liver enzymes.  CK elevated to 8700.  Started on IV fluid.  Neurology consulted and recommended transfer to [facility] and started on high-dose steroid through 07/17/2022.      Subjective: Seen and examined earlier this morning.  No major events overnight of this morning.  Feels tired and weak.  Also reported "the usual" pain in his shoulders and legs.  Pain improved after IV Dilaudid.  Objective: Vitals:   07/12/22 1416 07/12/22 1830 07/12/22 2100 07/13/22 0320  BP: (!) 157/68 (!) 181/79 (!) 172/82 (!) 159/79  Pulse: 77 78 79 71  Resp: 18 20 18 16   Temp: 98.1 F (36.7 C) 98.9 F (37.2 C) 98.6 F (37 C) 98.8 F (37.1 C)  TempSrc: Oral Oral Oral Oral  SpO2: 99% 100% 95% 95%  Weight:      Height:        Examination:  GENERAL: No apparent distress.  Nontoxic. HEENT: MMM.  Vision and hearing grossly intact.  NECK: Supple.  No apparent JVD.  RESP:  No IWOB.  Fair aeration bilaterally. CVS:  RRR. Heart sounds normal.  ABD/GI/GU: BS+. Abd soft, NTND.  MSK/EXT:  Moves extremities. No apparent deformity. No edema.  SKIN: no apparent skin lesion or wound NEURO: Awake, alert and oriented appropriately.  No apparent focal neuro deficit. PSYCH: Calm. Normal affect.   Procedures:  None  Microbiology summarized: COVID-19, influenza and RSV PCR nonreactive.  Assessment and plan: Principal Problem:   Rhabdomyolysis Active Problems:   ARF (acute renal failure)    Non-insulin dependent type 2 diabetes mellitus  Nontraumatic rhabdomyolysis: Has been physically deconditioned lately.  Seems she was on a statin that was discontinued indefinitely.  Has history of myasthenia gravis.  CK improving. -Continue IV fluid -Continue Solu-Medrol 1 g daily for 5 days per neurology -Check inflammatory markers  Myasthenia gravis: No signs of exacerbation.  On CellCept, prednisone, Mestinon at home. -Continue home CellCept, Mestinon -Hold prednisone while on Solu-Medrol -Continue NIF/VC per RT  Left and right hip pain/osteoarthritis: Right hip x-ray without acute finding. -Follow MRI of left femur. -Expect steroid to help with pain. -Continue home Robaxin  Elevated LFTs: Likely due to rhabdo.  Improving. Continue monitoring   AKI: Cr trended up despite improvement in rhabdo. Recent Labs    09/16/21 0826 09/17/21 0621 09/18/21 0622 09/19/21 0800 07/10/22 1252 07/10/22 1755 07/11/22 0643 07/12/22 0545 07/13/22 0446  BUN 29* 21* 12 14 55*  --  46* 40* 33*  CREATININE 2.04* 1.56* 1.50* 1.33* 2.76* 2.65* 2.61* 2.59* 2.90*  -Continue IV fluid -Avoid nephrotoxic meds -Renal ultrasound if worse or does not improve.  New NIDDM-2 with hyperglycemia: A1c 8.1%.  Hyperglycemia likely due to steroid. -Start semaglutide 8 units daily -SSI-moderate -Further adjustment as appropriate  Leukocytosis: Mild. -Expect rise with steroid   Hypokalemia/hypomagnesemia -Monitor replenish as appropriate   Elevated TSH: TSH 10.4.  Free T40.66. -Recheck outpatient   Acute metabolic acidosis: Likely due to IV fluid. -Continue monitoring  HTN: BP slightly elevated. -Continue Toprol-XL -Add hydralazine as needed   Obesity Body mass index is 31.39 kg/m. -Counseled on lifestyle change          DVT prophylaxis:  heparin injection 5,000 Units Start: 07/10/22 2200  Code Status: Full code Family Communication: None at bedside Level of care: Progressive Status  is: Inpatient Remains inpatient appropriate because: Nontraumatic rhabdomyolysis requiring IV fluid and high-dose steroid   Final disposition: TBD Consultants:  Neurology  55 minutes with more than 50% spent in reviewing records, counseling patient/family and coordinating care.   Sch Meds:  Scheduled Meds:  [START ON 07/24/2022] cyanocobalamin  1,000 mcg Intramuscular Q2 months   desmopressin  0.6 mg Oral QHS   estradiol  2 mg Oral QHS   heparin  5,000 Units Subcutaneous Q8H   insulin aspart  0-15 Units Subcutaneous TID WC   methocarbamol  750 mg Oral QHS   metoprolol succinate  25 mg Oral QHS   mycophenolate  1,000 mg Oral BID   pantoprazole  40 mg Oral Daily   pregabalin  75 mg Oral QHS   progesterone  100 mg Oral QHS   rOPINIRole  0.5 mg Oral QHS   Continuous Infusions:  lactated ringers 125 mL/hr at 07/12/22 1400   methylPREDNISolone (SOLU-MEDROL) injection 1,000 mg (07/12/22 1736)   PRN Meds:.acetaminophen, albuterol, butalbital-acetaminophen-caffeine, diphenhydrAMINE, hydrALAZINE, HYDROcodone-acetaminophen, HYDROmorphone (DILAUDID) injection, ondansetron (ZOFRAN) IV, ondansetron, pyridostigmine  Antimicrobials: Anti-infectives (From admission, onward)    None        I have personally reviewed the following labs and images: CBC: Recent Labs  Lab 07/10/22 1252 07/10/22 1755 07/11/22 0643 07/12/22 0545 07/13/22 0446  WBC 17.9* 13.9* 13.1* 11.2* 10.9*  NEUTROABS 13.5*  --   --   --  9.9*  HGB 11.8* 10.6* 9.7* 10.1* 9.6*  HCT 34.9* 32.8* 29.6* 30.4* 26.9*  MCV 93.6 98.5 97.7 97.7 89.7  PLT 227 180 179 181 166   BMP &GFR Recent Labs  Lab 07/10/22 1252 07/10/22 1755 07/11/22 0643 07/12/22 0545 07/13/22 0446  NA 135  --  141 138 135  K 3.5  --  3.6 2.9* 4.0  CL 108  --  112* 108 104  CO2 15*  --  18* 19* 17*  GLUCOSE 180*  --  130* 131* 325*  BUN 55*  --  46* 40* 33*  CREATININE 2.76* 2.65* 2.61* 2.59* 2.90*  CALCIUM 8.9  --  8.2* 9.2 9.1  MG  --    --   --  1.6*  --    Estimated Creatinine Clearance: 18.4 mL/min (A) (by C-G formula based on SCr of 2.9 mg/dL (H)). Liver & Pancreas: Recent Labs  Lab 07/10/22 1252 07/11/22 0643 07/12/22 0545 07/13/22 0446  AST 202* 147* 153* 94*  ALT 223* 174* 193* 179*  ALKPHOS 115 80 143* 130*  BILITOT 0.7 0.7 0.9 0.9  PROT 7.0 5.3* 5.9* 5.5*  ALBUMIN 3.3* 2.7* 2.8* 2.5*   No results for input(s): "LIPASE", "AMYLASE" in the last 168 hours. No results for input(s): "AMMONIA" in the last 168 hours. Diabetic: Recent Labs    07/13/22 0446  HGBA1C 8.1*   No results for input(s): "GLUCAP" in the last 168 hours. Cardiac Enzymes: Recent Labs  Lab 07/10/22 1252 07/11/22 0643 07/12/22 0545 07/13/22 0446  CKTOTAL 8,200* 5,803* 3,927* 2,648*   No results for input(s): "PROBNP" in the last 8760 hours. Coagulation Profile: No results for input(s): "INR", "PROTIME" in the last 168 hours. Thyroid Function Tests: Recent  Labs    07/12/22 0545 07/13/22 0446  TSH 10.483*  --   FREET4  --  0.66   Lipid Profile: No results for input(s): "CHOL", "HDL", "LDLCALC", "TRIG", "CHOLHDL", "LDLDIRECT" in the last 72 hours. Anemia Panel: No results for input(s): "VITAMINB12", "FOLATE", "FERRITIN", "TIBC", "IRON", "RETICCTPCT" in the last 72 hours. Urine analysis:    Component Value Date/Time   COLORURINE BROWN (A) 07/10/2022 1350   APPEARANCEUR CLOUDY (A) 07/10/2022 1350   LABSPEC 1.025 07/10/2022 1350   PHURINE 5.0 07/10/2022 1350   GLUCOSEU NEGATIVE 07/10/2022 1350   HGBUR LARGE (A) 07/10/2022 1350   BILIRUBINUR NEGATIVE 07/10/2022 1350   KETONESUR NEGATIVE 07/10/2022 1350   PROTEINUR >=300 (A) 07/10/2022 1350   NITRITE NEGATIVE 07/10/2022 1350   LEUKOCYTESUR NEGATIVE 07/10/2022 1350   Sepsis Labs: Invalid input(s): "PROCALCITONIN", "LACTICIDVEN"  Microbiology: Recent Results (from the past 240 hour(s))  Resp panel by RT-PCR (RSV, Flu A&B, Covid) Urine, Clean Catch     Status: None    Collection Time: 07/10/22  1:39 PM   Specimen: Urine, Clean Catch; Nasal Swab  Result Value Ref Range Status   SARS Coronavirus 2 by RT PCR NEGATIVE NEGATIVE Final    Comment: (NOTE) SARS-CoV-2 target nucleic acids are NOT DETECTED.  The SARS-CoV-2 RNA is generally detectable in upper respiratory specimens during the acute phase of infection. The lowest concentration of SARS-CoV-2 viral copies this assay can detect is 138 copies/mL. A negative result does not preclude SARS-Cov-2 infection and should not be used as the sole basis for treatment or other patient management decisions. A negative result may occur with  improper specimen collection/handling, submission of specimen other than nasopharyngeal swab, presence of viral mutation(s) within the areas targeted by this assay, and inadequate number of viral copies(<138 copies/mL). A negative result must be combined with clinical observations, patient history, and epidemiological information. The expected result is Negative.  Fact Sheet for Patients:  BloggerCourse.com  Fact Sheet for Healthcare Providers:  SeriousBroker.it  This test is no t yet approved or cleared by the [place] FDA and  has been authorized for detection and/or diagnosis of SARS-CoV-2 by FDA under an Emergency Use Authorization (EUA). This EUA will remain  in effect (meaning this test can be used) for the duration of the COVID-19 declaration under Section 564(b)(1) of the Act, 21 U.S.C.section 360bbb-3(b)(1), unless the authorization is terminated  or revoked sooner.       Influenza A by PCR NEGATIVE NEGATIVE Final   Influenza B by PCR NEGATIVE NEGATIVE Final    Comment: (NOTE) The Xpert Xpress SARS-CoV-2/FLU/RSV plus assay is intended as an aid in the diagnosis of influenza from Nasopharyngeal swab specimens and should not be used as a sole basis for treatment. Nasal washings and aspirates are  unacceptable for Xpert Xpress SARS-CoV-2/FLU/RSV testing.  Fact Sheet for Patients: BloggerCourse.com  Fact Sheet for Healthcare Providers: SeriousBroker.it  This test is not yet approved or cleared by the [place] FDA and has been authorized for detection and/or diagnosis of SARS-CoV-2 by FDA under an Emergency Use Authorization (EUA). This EUA will remain in effect (meaning this test can be used) for the duration of the COVID-19 declaration under Section 564(b)(1) of the Act, 21 U.S.C. section 360bbb-3(b)(1), unless the authorization is terminated or revoked.     Resp Syncytial Virus by PCR NEGATIVE NEGATIVE Final    Comment: (NOTE) Fact Sheet for Patients: BloggerCourse.com  Fact Sheet for Healthcare Providers: SeriousBroker.it  This test is not yet approved or cleared  by the [place] and has been authorized for detection and/or diagnosis of SARS-CoV-2 by FDA under an Emergency Use Authorization (EUA). This EUA will remain in effect (meaning this test can be used) for the duration of the COVID-19 declaration under Section 564(b)(1) of the Act, 21 U.S.C. section 360bbb-3(b)(1), unless the authorization is terminated or revoked.  Performed at [facility] ([facility] [address] [place], [place] [address]     Radiology Studies: No results found.    Candler Ginsberg T. Melba Araki [facility]  If 7PM-7AM, please contact night-coverage [email or web address] 07/13/2022, 11:47 AM

## 2022-07-13 NOTE — Progress Notes (Signed)
Physical Therapy Treatment Patient Details Name: Teresa Lee MRN: [id number] DOB: 09-07-1960 Today's Date: 07/13/2022   History of Present Illness Patient is a 62 year old female who presented on 4/22 with weakness and fatigue.Patient was admitted with rhabdomyolysis. PMH: anxiety, osteoarthritis, asthma, crohn's disease, fibromyalgia, myasthenia gravis, PLIF back surgery, spinal cord stimulator insertion    PT Comments    Pt seen for PT after just arriving back to room from MRI. She required min guard assist transfers, and supervision bed mobility. Pt declining gait due to fatigue lethargy. Pt transferred from [facility] to [facility] yesterday, and she reports not having much sleep. Pt also received dilaudid just prior to MRI. PT will resume gait efforts next session.    Recommendations for follow up therapy are one component of a multi-disciplinary discharge planning process, led by the attending physician.  Recommendations may be updated based on patient status, additional functional criteria and insurance authorization.  Follow Up Recommendations       Assistance Recommended at Discharge Intermittent Supervision/Assistance  Patient can return home with the following A little help with walking and/or transfers;A little help with bathing/dressing/bathroom;Assistance with cooking/housework;Assist for transportation;Help with stairs or ramp for entrance   Equipment Recommendations  Rollator (4 wheels)    Recommendations for Other Services       Precautions / Restrictions Precautions Precautions: Fall     Mobility  Bed Mobility Overal bed mobility: Needs Assistance Bed Mobility: Supine to Sit, Sit to Supine     Supine to sit: Supervision Sit to supine: Supervision   General bed mobility comments: for safety, +rail    Transfers Overall transfer level: Needs assistance Equipment used: Ambulation equipment used Transfers: Sit to/from Stand, Bed to chair/wheelchair/BSC Sit to  Stand: Min guard Stand pivot transfers: Min guard              Ambulation/Gait                   Stairs             Wheelchair Mobility    Modified Rankin (Stroke Patients Only)       Balance Overall balance assessment: Needs assistance Sitting-balance support: Feet supported, No upper extremity supported Sitting balance-Leahy Scale: Good     Standing balance support: During functional activity, Single extremity supported, No upper extremity supported Standing balance-Leahy Scale: Fair Standing balance comment: static stand without support                            Cognition Arousal/Alertness: Lethargic, Suspect due to medications Behavior During Therapy: WFL for tasks assessed/performed Overall Cognitive Status: Within Functional Limits for tasks assessed                                 General Comments: Pt received dilaudid just prior to MRI        Exercises      General Comments        Pertinent Vitals/Pain Pain Assessment Pain Assessment: Faces Faces Pain Scale: Hurts a little bit Pain Location: LLE Pain Descriptors / Indicators: Discomfort Pain Intervention(s): Premedicated before session, Limited activity within patient's tolerance    Home Living                          Prior Function  PT Goals (current goals can now be found in the care plan section) Acute Rehab PT Goals Patient Stated Goal: home Progress towards PT goals: Progressing toward goals    Frequency    Min 3X/week      PT Plan Frequency needs to be updated    Co-evaluation              AM-PAC PT "6 Clicks" Mobility   Outcome Measure  Help needed turning from your back to your side while in a flat bed without using bedrails?: A Little Help needed moving from lying on your back to sitting on the side of a flat bed without using bedrails?: A Little Help needed moving to and from a bed to a chair  (including a wheelchair)?: A Little Help needed standing up from a chair using your arms (e.g., wheelchair or bedside chair)?: A Little Help needed to walk in hospital room?: A Little Help needed climbing 3-5 steps with a railing? : A Lot 6 Click Score: 17    End of Session   Activity Tolerance: Patient limited by fatigue Patient left: in bed;with call bell/phone within reach;with bed alarm set Nurse Communication: Mobility status PT Visit Diagnosis: Unsteadiness on feet (R26.81);Other abnormalities of gait and mobility (R26.89);Muscle weakness (generalized) (M62.81)     Time: 4098-1191 PT Time Calculation (min) (ACUTE ONLY): 10 min  Charges:  $Therapeutic Activity: 8-22 mins                     Ferd Glassing., PT  Office # [phone]    Ilda Foil 07/13/2022, 11:21 AM

## 2022-07-13 NOTE — Progress Notes (Signed)
PT Cancellation Note  Patient Details Name: Teresa Lee MRN: [id number] DOB: 09/25/1960   Cancelled Treatment:    Reason Eval/Treat Not Completed: Patient at procedure or test/unavailable. PT eval attempted. Pt first with RN, then respiratory, and now has left the floor for MRI. PT to re-attempt as time allows.   Ilda Foil 07/13/2022, 8:52 AM

## 2022-07-13 NOTE — Progress Notes (Signed)
Hold NIF/VC  Pt resting comfortably

## 2022-07-13 NOTE — Progress Notes (Signed)
NIF -32 VC 1.8 Great effort and technique.

## 2022-07-13 NOTE — Progress Notes (Signed)
NEUROLOGY CONSULTATION PROGRESS NOTE   Date of service: July 13, 2022 Patient Name: Teresa Lee MRN:  [id number] DOB:  04-06-1960  Brief HPI  Teresa Lee is a 62 y.o. female with PMH significant for myasthenia gravis on as needed Mestinon and CellCept 1000 twice daily, fibromyalgia, GERD, hypertension, hyperlipidemia who is currently admitted to the hospital with progressive generalized weakness and muscle aches over the last few months.  She was found to have rhabdomyolysis and AKI.  Overall, presentation is concerning for idiopathic inflammatory myositis. No skin lesions concerning for dermatomyositis. Could be statin induced myositis, polymyositis, inclusion body myositis, antisynthetase syndrome, myositis secondary to rheum disorder or overlap syndrome. No viral symptoms around the time of onset of these symptoms that patient can recall and thus not particularly concerning for viral myositis.   I think she is stable from a myasthenia standpoint and is not experiencing her typical MG symptoms.  Interval Hx   S/p first dose of solumedrol and tolerated fine. Denies any significant improvement. AKI seems to have worsened.  Vitals   Vitals:   07/12/22 1830 07/12/22 2100 07/13/22 0320 07/13/22 1229  BP: (!) 181/79 (!) 172/82 (!) 159/79 (!) 160/84  Pulse: 78 79 71 70  Resp: Temp: 98.9 F (37.2 C) 98.6 F (37 C) 98.8 F (37.1 C) 98.3 F (36.8 C)  TempSrc: Oral Oral Oral Oral  SpO2: 100% 95% 95% 98%  Weight:      Height:         Body mass index is 31.39 kg/m.  Physical Exam   General: Laying comfortably in bed; in no acute distress.  HENT: Normal oropharynx and mucosa. Normal external appearance of ears and nose.  Neck: Supple, no pain or tenderness  CV: No JVD. No peripheral edema.  Pulmonary: Symmetric Chest rise. Normal respiratory effort.  Abdomen: Soft to touch, non-tender.  Ext: No cyanosis, edema, or deformity  Skin: No rash. Normal  palpation of skin.   Musculoskeletal: Normal digits and nails by inspection. No clubbing.   Neurologic Examination  Mental status/Cognition: Alert, oriented to self, place, month and year, good attention.  Speech/language: Fluent, comprehension intact, object naming intact, repetition intact.  Cranial nerves:   CN II Pupils equal and reactive to light, no VF deficits    CN III,IV,VI EOM intact, no gaze preference or deviation, no nystagmus    CN V normal sensation in V1, V2, and V3 segments bilaterally    CN VII no asymmetry, no nasolabial fold flattening    CN VIII normal hearing to speech    CN IX & X normal palatal elevation, no uvular deviation    CN XI 5/5 head turn and 5/5 shoulder shrug bilaterally   CN XII midline tongue protrusion    Motor:  Muscle bulk: normal, tone normal, pronator drift none,  tremor none Mvmt Root Nerve  Muscle Right Left Comments  SA C5/6 Ax Deltoid 4+ 4+   EF C5/6 Mc Biceps 5 5   EE C6/7/8 Rad Triceps 5 5   WF C6/7 Med FCR     WE C7/8 PIN ECU     F Ab C8/T1 U ADM/FDI 5 5   HF L1/2/3 Fem Illopsoas 4+ 4+   KE L2/3/4 Fem Quad 5 5   DF L4/5 D Peron Tib Ant 5 5   PF S1/2 Tibial Grc/Sol 5 5     Sensation:  Light touch Intact throughout   Pin prick    Temperature  Vibration   Proprioception    Coordination/Complex Motor:  - Finger to Nose intact BL - Heel to shin unable to do again due to pain today - Rapid alternating movement are slowed - Gait: deferred again for her safety.  Labs   Basic Metabolic Panel:  Lab Results  Component Value Date   NA 135 07/13/2022   K 4.0 07/13/2022   CO2 17 (L) 07/13/2022   GLUCOSE 325 (H) 07/13/2022   BUN 33 (H) 07/13/2022   CREATININE 2.90 (H) 07/13/2022   CALCIUM 9.1 07/13/2022   GFRNONAA 18 (L) 07/13/2022   GFRAA >60 02/25/2019   HbA1c:  Lab Results  Component Value Date   HGBA1C 8.1 (H) 07/13/2022   LDL:  Lab Results  Component Value Date   LDLCALC 162 (H) 08/08/2013   Urine Drug  Screen: No results found for: "LABOPIA", "COCAINSCRNUR", "LABBENZ", "AMPHETMU", "THCU", "LABBARB"  Alcohol Level No results found for: "ETH" No results found for: "PHENYTOIN", "ZONISAMIDE", "LAMOTRIGINE", "LEVETIRACETA" No results found for: "PHENYTOIN", "PHENOBARB", "VALPROATE", "CBMZ"  Imaging and Diagnostic studies   CT Head without contrast(Personally reviewed): 1.  No evidence of an acute intracranial abnormality. 2. Mild cerebral white matter disease, nonspecific but most often secondary to chronic small vessel ischemia. 3. Redemonstrated prominent perivascular space versus chronic lacunar infarct within the right external capsule/basal ganglia. 4. Unchanged from prior examinations, there is focal irregularity and dilation of the anterior body of the right lateral ventricle. Although not definitively identified, closed lip schizencephaly cannot be excluded. Consider a non-emergent brain MRI for further evaluation (if not already performed at an outside institution). 5. Absence of the septum pellucidum.  Lab Results  Component Value Date   CKTOTAL 2,648 (H) 07/13/2022   Lab Results  Component Value Date   NA 135 07/13/2022   K 4.0 07/13/2022   CO2 17 (L) 07/13/2022   GLUCOSE 325 (H) 07/13/2022   BUN 33 (H) 07/13/2022   CREATININE 2.90 (H) 07/13/2022   CALCIUM 9.1 07/13/2022   GFRNONAA 18 (L) 07/13/2022   MRI L femur w + w/o C:  NIF: -32 VC: 1.8  Impression   Teresa Lee is a 62 y.o. female with PMH significant for myasthenia gravis on as needed Mestinon and CellCept 1000 twice daily, fibromyalgia, GERD, hypertension, hyperlipidemia who is currently admitted to the hospital with progressive generalized weakness and muscle aches over the last few months.  She was found to have rhabdomyolysis and AKI.  Overall, presentation is concerning for idiopathic inflammatory myositis. No skin lesions concerning for dermatomyositis. Could be statin induced myositis,  polymyositis, inclusion body myositis, antisynthetase syndrome, myositis secondary to rheum disorder or overlap syndrome. No viral symptoms around the time of onset of these symptoms that patient can recall and thus not particularly concerning for viral myositis.   I think she is stable from a myasthenia standpoint and is not experiencing her typical MG symptoms.  Recommendations  - cont IV solumedrol  daily x 5 doses. Will need a prolonged PO taper afterwards. - ANA IFA, ENA panel, serum Myomarker 3 plus panel is pending. - MRI left femur with and without contrast demonstrates muscle edema and inflammation - consult Gen surg for muscle biopsy - Cardiac Monitoring. - Trop not significantly elevated. Repet trop trended down. Likely elevated mildly due to AKI. - Every 6 hours hours NIFs and VC until 3rd dose of solumedrol. Will space out to Q12 hours tomorrow afternoon/evening. - Recommend elective intubation for respiratory compromise if VC falls below 15 to  20 mL/kg and or NIFs falls below -20cm/H2O. If poor effort and not sure, can always get ABG to assess for CO2 retention. Elective intubation for airway cmopromise if she has difficulty clearing her secretions. Oxygen saturation should not be used to make decision regarding intubation. - Recommend Mestinon PO 30mg  TID PRN. If unable to swallow, can switch from PO to IV.(30mg  PO is equivalent to 1mg  IV). She rarely takes it at home. - continue Cellcept 1000mg  BID. - recommend monitoring on progressive floor for the next 2 days. Steroids can temporarily worsen Myasthenia gravis. - Medications that may worsen or trigger MG exacerbation: Class IA antiarrhythmics, magnesium, flouroquinolones, macrolides, aminoglycosides, penicillamine, curare, interferon alpha, botox, quinine. Use with caution: calcium channel blocker, beta blockers and statins. - already established with Neuromuscular clinic at [facility]. Follow up with [facility] at  discharge. ______________________________________________________________________   Thank you for the opportunity to take part in the care of this patient. If you have any further questions, please contact the neurology consultation attending.  Signed,  Erick Blinks [facility] Pager Number [phone]

## 2022-07-14 DIAGNOSIS — M6282 Rhabdomyolysis: Secondary | ICD-10-CM | POA: Diagnosis not present

## 2022-07-14 DIAGNOSIS — N179 Acute kidney failure, unspecified: Secondary | ICD-10-CM | POA: Diagnosis not present

## 2022-07-14 DIAGNOSIS — E119 Type 2 diabetes mellitus without complications: Secondary | ICD-10-CM | POA: Diagnosis not present

## 2022-07-14 DIAGNOSIS — M6019 Interstitial myositis, multiple sites: Secondary | ICD-10-CM | POA: Diagnosis not present

## 2022-07-14 DIAGNOSIS — G7000 Myasthenia gravis without (acute) exacerbation: Secondary | ICD-10-CM | POA: Diagnosis not present

## 2022-07-14 LAB — CBC WITH DIFFERENTIAL/PLATELET
Abs Immature Granulocytes: 0.31 10*3/uL — ABNORMAL HIGH (ref 0.00–0.07)
Basophils Absolute: 0 10*3/uL (ref 0.0–0.1)
Basophils Relative: 0 %
Eosinophils Absolute: 0 10*3/uL (ref 0.0–0.5)
Eosinophils Relative: 0 %
HCT: 27.2 % — ABNORMAL LOW (ref 36.0–46.0)
Hemoglobin: 9 g/dL — ABNORMAL LOW (ref 12.0–15.0)
Immature Granulocytes: 2 %
Lymphocytes Relative: 3 %
Lymphs Abs: 0.5 10*3/uL — ABNORMAL LOW (ref 0.7–4.0)
MCH: 31.5 pg (ref 26.0–34.0)
MCHC: 33.1 g/dL (ref 30.0–36.0)
MCV: 95.1 fL (ref 80.0–100.0)
Monocytes Absolute: 0.3 10*3/uL (ref 0.1–1.0)
Monocytes Relative: 2 %
Neutro Abs: 12.7 10*3/uL — ABNORMAL HIGH (ref 1.7–7.7)
Neutrophils Relative %: 93 %
Platelets: 192 10*3/uL (ref 150–400)
RBC: 2.86 MIL/uL — ABNORMAL LOW (ref 3.87–5.11)
RDW: 13.5 % (ref 11.5–15.5)
WBC: 13.8 10*3/uL — ABNORMAL HIGH (ref 4.0–10.5)
nRBC: 0 % (ref 0.0–0.2)

## 2022-07-14 LAB — COMPREHENSIVE METABOLIC PANEL
ALT: 151 U/L — ABNORMAL HIGH (ref 0–44)
AST: 47 U/L — ABNORMAL HIGH (ref 15–41)
Albumin: 2.8 g/dL — ABNORMAL LOW (ref 3.5–5.0)
Alkaline Phosphatase: 98 U/L (ref 38–126)
Anion gap: 15 (ref 5–15)
BUN: 38 mg/dL — ABNORMAL HIGH (ref 6–20)
CO2: 18 mmol/L — ABNORMAL LOW (ref 22–32)
Calcium: 9 mg/dL (ref 8.9–10.3)
Chloride: 105 mmol/L (ref 98–111)
Creatinine, Ser: 2.64 mg/dL — ABNORMAL HIGH (ref 0.44–1.00)
GFR, Estimated: 20 mL/min — ABNORMAL LOW (ref >60)
Glucose, Bld: 328 mg/dL — ABNORMAL HIGH (ref 70–99)
Potassium: 4 mmol/L (ref 3.5–5.1)
Sodium: 138 mmol/L (ref 135–145)
Total Bilirubin: 0.7 mg/dL (ref 0.3–1.2)
Total Protein: 5.9 g/dL — ABNORMAL LOW (ref 6.5–8.1)

## 2022-07-14 LAB — GLUCOSE, CAPILLARY
Glucose-Capillary: 296 mg/dL — ABNORMAL HIGH (ref 70–99)
Glucose-Capillary: 239 mg/dL — ABNORMAL HIGH (ref 70–99)
Glucose-Capillary: 263 mg/dL — ABNORMAL HIGH (ref 70–99)
Glucose-Capillary: 326 mg/dL — ABNORMAL HIGH (ref 70–99)

## 2022-07-14 LAB — CK: Total CK: 1056 U/L — ABNORMAL HIGH (ref 38–234)

## 2022-07-14 LAB — SEDIMENTATION RATE: Sed Rate: 76 mm/hr — ABNORMAL HIGH (ref 0–22)

## 2022-07-14 LAB — PHOSPHORUS: Phosphorus: 4.4 mg/dL (ref 2.5–4.6)

## 2022-07-14 LAB — C-REACTIVE PROTEIN: CRP: 1 mg/dL — ABNORMAL HIGH (ref <1.0)

## 2022-07-14 LAB — MAGNESIUM: Magnesium: 1.5 mg/dL — ABNORMAL LOW (ref 1.7–2.4)

## 2022-07-14 MED ORDER — HYDRALAZINE HCL 50 MG PO TABS
50.0000 mg | ORAL_TABLET | Freq: Three times a day (TID) | ORAL | Status: DC
  Administered 2022-07-14 – 2022-07-17 (×8): 50 mg via ORAL
  Filled 2022-07-14 (×8): qty 1

## 2022-07-14 MED ORDER — INSULIN GLARGINE-YFGN 100 UNIT/ML ~~LOC~~ SOLN
15.0000 [IU] | Freq: Every day | SUBCUTANEOUS | Status: DC
  Administered 2022-07-14 – 2022-07-15 (×2): 15 [IU] via SUBCUTANEOUS
  Filled 2022-07-14 (×2): qty 0.15

## 2022-07-14 MED ORDER — MORPHINE SULFATE (PF) 2 MG/ML IV SOLN
2.0000 mg | Freq: Once | INTRAVENOUS | Status: AC
  Administered 2022-07-14: 2 mg via INTRAVENOUS
  Filled 2022-07-14: qty 1

## 2022-07-14 MED ORDER — MAGNESIUM SULFATE 2 GM/50ML IV SOLN
2.0000 g | Freq: Once | INTRAVENOUS | Status: AC
  Administered 2022-07-14: 2 g via INTRAVENOUS
  Filled 2022-07-14: qty 50

## 2022-07-14 MED ORDER — HYDRALAZINE HCL 25 MG PO TABS
25.0000 mg | ORAL_TABLET | Freq: Three times a day (TID) | ORAL | Status: DC
  Administered 2022-07-14: 25 mg via ORAL
  Filled 2022-07-14: qty 1

## 2022-07-14 MED ORDER — HYDRALAZINE HCL 25 MG PO TABS
25.0000 mg | ORAL_TABLET | Freq: Four times a day (QID) | ORAL | Status: DC | PRN
  Administered 2022-07-14: 25 mg via ORAL
  Filled 2022-07-14: qty 1

## 2022-07-14 MED ORDER — HYDRALAZINE HCL 20 MG/ML IJ SOLN
10.0000 mg | Freq: Four times a day (QID) | INTRAMUSCULAR | Status: DC | PRN
  Administered 2022-07-17: 10 mg via INTRAVENOUS
  Filled 2022-07-14: qty 1

## 2022-07-14 MED ORDER — IPRATROPIUM-ALBUTEROL 0.5-2.5 (3) MG/3ML IN SOLN
3.0000 mL | Freq: Four times a day (QID) | RESPIRATORY_TRACT | Status: DC
  Filled 2022-07-14: qty 3

## 2022-07-14 MED ORDER — FUROSEMIDE 10 MG/ML IJ SOLN
40.0000 mg | Freq: Once | INTRAMUSCULAR | Status: AC
  Administered 2022-07-14: 40 mg via INTRAVENOUS
  Filled 2022-07-14: qty 4

## 2022-07-14 NOTE — Consult Note (Signed)
[facility] 08/04/1960  [id number].    Requesting MD: Alanda Slim, MD Chief Complaint/Reason for Consult: muscle biopsy, possible myositis   HPI:  Teresa Lee is a 62 y/o F with PMH myasthenia gravis, fibromyalgia, OA, DM2 (8.1%), GERD, obesity, HTN, and HLD who presented to the hospital 4/22 with worsening muscle aches and progressive weakness. She tells me this weakness became worse over the course of 2-4 weeks. She was admitted with rhabdomyolysis and AKI. Neurology was consulted due to history of MG and fells she is stable from that perspective. Based on presentation there is concern for myositis and general surgery is consulted for muscle biopsy.   ROS: Review of Systems  All other systems reviewed and are negative.   Family History  Problem Relation Age of Onset   Asthma Daughter    Heart disease Mother    Cancer Mother    Hyperlipidemia Mother    Hypertension Mother    Diabetes Father     Past Medical History:  Diagnosis Date   Anxiety    Arthritis    osteoarthritis   Asthma    Blind left eye    Cancer (HCC)    skin   Crohn's disease (HCC)    CTS (carpal tunnel syndrome)    DJD (degenerative joint disease)    Both cervical spine and LS spine   Fibromyalgia    GERD (gastroesophageal reflux disease)    Heart murmur    Herniated nucleus pulposus    Hyperlipidemia    Hypertension    IBS (irritable bowel syndrome)    Leukocytosis    Myasthenia gravis    Myasthenia gravis (HCC)    Myasthenia gravis (HCC)    Non-insulin dependent type 2 diabetes mellitus (HCC) 07/13/2022   Pneumonia    Sleep apnea    Spinal cord stimulator status     Past Surgical History:  Procedure Laterality Date   BACK SURGERY     BIOPSY  05/24/2020   Procedure: BIOPSY;  Surgeon: Bernette Redbird, MD;  Location: [facility] ENDOSCOPY;  Service: Endoscopy;;   BREAST REDUCTION SURGERY     CARPAL TUNNEL RELEASE     bilateral   cervical disc inj     CHOLECYSTECTOMY     COLONOSCOPY WITH  PROPOFOL N/A 05/24/2020   Procedure: COLONOSCOPY WITH PROPOFOL;  Surgeon: Bernette Redbird, MD;  Location: [facility] ENDOSCOPY;  Service: Endoscopy;  Laterality: N/A;   ENDOMETRIAL ABLATION W/ NOVASURE     ESOPHAGOGASTRODUODENOSCOPY (EGD) WITH PROPOFOL N/A 05/24/2020   Procedure: ESOPHAGOGASTRODUODENOSCOPY (EGD) WITH PROPOFOL;  Surgeon: Bernette Redbird, MD;  Location: [facility] ENDOSCOPY;  Service: Endoscopy;  Laterality: N/A;   LUMBAR LAMINECTOMY/DECOMPRESSION MICRODISCECTOMY Right 05/04/2020   Procedure: Right Lumbar Five Sacral One Microdiscectomy;  Surgeon: Maeola Harman, MD;  Location: [facility] OR;  Service: Neurosurgery;  Laterality: Right;  posterior   MELANOMA EXCISION     NASAL SINUS SURGERY     SPINAL CORD STIMULATOR INSERTION  2019   SPINAL CORD STIMULATOR INSERTION N/A 10/18/2021   Procedure: REMOVAL OF  SPINAL CORD STIMULATOR;  Surgeon: Dawley, Alan Mulder, DO;  Location: [facility] OR;  Service: Neurosurgery;  Laterality: N/A;  3C   THYMECTOMY     due to myastenia gravis   THYMECTOMY     TRANSFORAMINAL LUMBAR INTERBODY FUSION (TLIF) WITH PEDICLE SCREW FIXATION 1 LEVEL Right 05/04/2020   Procedure: Right Lumbar Four-Five Transforaminal lumbar interbody fusion;  Surgeon: Maeola Harman, MD;  Location: [facility] OR;  Service: Neurosurgery;  Laterality: Right;  posterior  Social History:  reports that she has never smoked. She has never used smokeless tobacco. She reports current alcohol use. She reports that she does not use drugs.  Allergies:  Allergies  Allergen Reactions   Duloxetine Nausea Only and Other (See Comments)    Cymbalta    Latex Other (See Comments)    BLISTERS   Lorazepam Other (See Comments)    Hallucinations   Orange Concentrate [Flavoring Agent] Other (See Comments)    Acid reflux   Other Hives, Nausea Only, Rash and Other (See Comments)    Some nuts cause RASHES   Percocet [Oxycodone-Acetaminophen] Hives   Sulfa Antibiotics Hives   Statins     Statin induced myositis from rosuvastatin. DO NOT  Attempt to try other statins.   Tape Other (See Comments)    BLISTERS and BURNS THE SKIN   Bioflavonoids Rash    Causes burning of skin and blisters   Cefixime Other (See Comments)    Due to myasthenia gravis   Nucynta [Tapentadol] Other (See Comments)    Headache     Medications Prior to Admission  Medication Sig Dispense Refill   albuterol (PROVENTIL) (2.5 MG/3ML) 0.083% nebulizer solution Take 2.5 mg by nebulization every 6 (six) hours as needed for shortness of breath.     albuterol (VENTOLIN HFA) 108 (90 Base) MCG/ACT inhaler 1 PUFF EVERY 6 HOURS AS NEEDED FOR WHEEZING (Patient taking differently: Inhale 1 puff into the lungs every 6 (six) hours as needed for wheezing or shortness of breath.) 18 g 2   cyanocobalamin (,VITAMIN B-12,) 1000 MCG/ML injection Inject 1,000 mcg into the muscle See admin instructions. Inject 1,000 mcg into the muscle every 6-8 weeks     desmopressin (DDAVP) 0.2 MG tablet Take 0.6 mg by mouth at bedtime.     estradiol (ESTRACE) 2 MG tablet Take 2 mg by mouth at bedtime.     HYDROcodone-acetaminophen (NORCO) 10-325 MG tablet Take 1 tablet by mouth 5 (five) times daily as needed for moderate pain.     losartan (COZAAR) 50 MG tablet Take 50 mg by mouth daily.     meloxicam (MOBIC) 15 MG tablet Take 15 mg by mouth daily.     Mesalamine (ASACOL) 400 MG CPDR DR capsule Take 800 mg by mouth in the morning and at bedtime.     methocarbamol (ROBAXIN) 750 MG tablet Take 750 mg by mouth at bedtime.     metoprolol succinate (TOPROL-XL) 25 MG 24 hr tablet Take 25 mg by mouth at bedtime.     mycophenolate (CELLCEPT) 500 MG tablet Take 1,000 mg by mouth in the morning and at bedtime.     naloxone (NARCAN) nasal spray 4 mg/0.1 mL Place 1 spray into the nose once as needed (opioid overdose).     ondansetron (ZOFRAN) 4 MG tablet Take 4 mg by mouth every 8 (eight) hours as needed for nausea or vomiting.     pantoprazole (PROTONIX) 40 MG tablet TAKE 1 TABLET DAILY (Patient taking  differently: Take 40 mg by mouth daily before breakfast.) 90 tablet 3   potassium chloride SA (KLOR-CON M) 20 MEQ tablet Take 20 mEq by mouth at bedtime.     predniSONE (DELTASONE) 10 MG tablet Take 10 mg by mouth daily with breakfast.     pregabalin (LYRICA) 75 MG capsule Take 75 mg by mouth at bedtime.     progesterone (PROMETRIUM) 100 MG capsule Take 100 mg by mouth at bedtime.     pyridostigmine (MESTINON) 60 MG  tablet Take 30 mg by mouth 3 (three) times daily as needed (for Myasthenia gravis symtoms).     rOPINIRole (REQUIP) 1 MG tablet Take 1-2 tablets (1-2 mg total) by mouth at bedtime. (Patient taking differently: Take 1 mg by mouth See admin instructions. Take 1 tablet (1 mg) by mouth daily at bedtime and may take an additional 1 mg later if still needed for restless legs) 180 tablet 1   SYMBICORT 80-4.5 MCG/ACT inhaler Inhale 2 puffs into the lungs 2 (two) times daily. (Patient taking differently: Inhale 2 puffs into the lungs 2 (two) times daily as needed (shortness of breath).) 10.2 g 6   Vitamin D, Ergocalciferol, (DRISDOL) 1.25 MG (50000 UNIT) CAPS capsule Take 50,000 Units by mouth every 14 (fourteen) days.     loperamide (IMODIUM) 2 MG capsule Take 2 capsules (4 mg total) by mouth as needed for diarrhea or loose stools. (Patient not taking: Reported on 07/10/2022) 30 capsule 0   Spacer/Aero-Holding Chambers (AEROCHAMBER PLUS) inhaler Use as instructed to use with inahaler. 1 each 1     Physical Exam: Blood pressure (!) 184/100, pulse (!) 52, temperature (!) 97.4 F (36.3 C), temperature source Oral, resp. rate 18, height 5' (1.524 m), weight 72.9 kg, SpO2 98 %. General: Pleasant white female laying on hospital bed, appears stated age, NAD. HEENT: head -normocephalic, atraumatic; Eyes: PERRLA, no conjunctival injection, anicteric sclerae Neck- Trachea is midline CV- RRR, normal S1/S2, no M/R/G,  dorsalis pedis pulses 2+ BL there is pedal edema bilaterally Pulm- breathing is  non-labored ORA Abd- soft, NT/ND, no palpable hernias GU- deferred  MSK- UE/LE symmetrical, mild pain over left thigh without cellulitis or induration. Neuro- non-focal exam Psych- Alert and Oriented x3 with appropriate affect Skin: warm and dry, no rashes or lesions   Results for orders placed or performed during the hospital encounter of 07/10/22 (from the past 48 hour(s))  Miscellaneous [facility] test (send-out)     Status: None   Collection Time: 07/12/22  4:21 PM  Result Value Ref Range   [facility] test code [id number]     Comment: CORRECTED ON 04/25 AT 1017: PREVIOUSLY REPORTED AS SERUM   [facility] test name Myomarker 3 Plus     Comment: Performed at [facility], [address], [place], [place] [address] CORRECTED ON 04/25 AT 1017: PREVIOUSLY REPORTED AS SERUM    Misc [facility] result PENDING   Troponin I (High Sensitivity)     Status: Abnormal   Collection Time: 07/12/22  4:23 PM  Result Value Ref Range   Troponin I (High Sensitivity) 24 (H) <18 ng/L    Comment: (NOTE) Elevated high sensitivity troponin I (hsTnI) values and significant  changes across serial measurements may suggest ACS but many other  chronic and acute conditions are known to elevate hsTnI results.  Refer to the "Links" section for chest pain algorithms and additional  guidance. Performed at [facility], [address] [place], [place] [address]   Troponin I (High Sensitivity)     Status: Abnormal   Collection Time: 07/12/22  6:08 PM  Result Value Ref Range   Troponin I (High Sensitivity) 19 (H) <18 ng/L    Comment: (NOTE) Elevated high sensitivity troponin I (hsTnI) values and significant  changes across serial measurements may suggest ACS but many other  chronic and acute conditions are known to elevate hsTnI results.  Refer to the "Links" section for chest pain algorithms and additional  guidance. Performed at [facility],  [address], [place], [place]  [address]   CBC with Differential/Platelet     Status: Abnormal   Collection Time: 07/13/22  4:46 AM  Result Value Ref Range   WBC 10.9 (H) 4.0 - 10.5 K/uL   RBC 3.00 (L) 3.87 - 5.11 MIL/uL   Hemoglobin 9.6 (L) 12.0 - 15.0 g/dL   HCT 65.7 (L) 84.6 - 96.2 %   MCV 89.7 80.0 - 100.0 fL   MCH 32.0 26.0 - 34.0 pg   MCHC 35.7 30.0 - 36.0 g/dL   RDW 95.2 84.1 - 32.4 %   Platelets 166 150 - 400 K/uL   nRBC 0.0 0.0 - 0.2 %   Neutrophils Relative % 90 %   Neutro Abs 9.9 (H) 1.7 - 7.7 K/uL   Lymphocytes Relative 5 %   Lymphs Abs 0.5 (L) 0.7 - 4.0 K/uL   Monocytes Relative 1 %   Monocytes Absolute 0.1 0.1 - 1.0 K/uL   Eosinophils Relative 0 %   Eosinophils Absolute 0.0 0.0 - 0.5 K/uL   Basophils Relative 0 %   Basophils Absolute 0.0 0.0 - 0.1 K/uL   Immature Granulocytes 4 %   Abs Immature Granulocytes 0.39 (H) 0.00 - 0.07 K/uL    Comment: Performed at [facility], [address] [place], [place] [address]  CK     Status: Abnormal   Collection Time: 07/13/22  4:46 AM  Result Value Ref Range   Total CK 2,648 (H) 38 - 234 U/L    Comment: Performed at [facility], [address], [place], [place] [address]  Comprehensive metabolic panel     Status: Abnormal   Collection Time: 07/13/22  4:46 AM  Result Value Ref Range   Sodium 135 135 - 145 mmol/L   Potassium 4.0 3.5 - 5.1 mmol/L   Chloride 104 98 - 111 mmol/L   CO2 17 (L) 22 - 32 mmol/L   Glucose, Bld 325 (H) 70 - 99 mg/dL    Comment: Glucose reference range applies only to samples taken after fasting for at least 8 hours.   BUN 33 (H) 6 - 20 mg/dL   Creatinine, Ser 6.44 (H) 0.44 - 1.00 mg/dL   Calcium 9.1 8.9 - 03.4 mg/dL   Total Protein 5.5 (L) 6.5 - 8.1 g/dL   Albumin 2.5 (L) 3.5 - 5.0 g/dL   AST 94 (H) 15 - 41 U/L   ALT 179 (H) 0 - 44 U/L   Alkaline Phosphatase 130 (H) 38 - 126 U/L   Total Bilirubin 0.9 0.3 - 1.2 mg/dL   GFR, Estimated 18 (L) >60 mL/min    Comment: (NOTE) Calculated using the CKD-EPI Creatinine  Equation (2021)    Anion gap 14 5 - 15    Comment: Performed at [facility], [address] [place], [place] [address]  T4, free     Status: None   Collection Time: 07/13/22  4:46 AM  Result Value Ref Range   Free T4 0.66 0.61 - 1.12 ng/dL    Comment: (NOTE) Biotin ingestion may interfere with free T4 tests. If the results are inconsistent with the TSH level, previous test results, or the clinical presentation, then consider biotin interference. If needed, order repeat testing after stopping biotin. Performed at [facility], [address], [place], [place] [address]   Hemoglobin A1c     Status: Abnormal   Collection Time: 07/13/22  4:46 AM  Result Value Ref Range  Hgb A1c MFr Bld 8.1 (H) 4.8 - 5.6 %    Comment: (NOTE) Pre diabetes:          5.7%-6.4%  Diabetes:              >6.4%  Glycemic control for   <7.0% adults with diabetes    Mean Plasma Glucose 185.77 mg/dL    Comment: Performed at [facility], [address], [place], [place] [address]  Glucose, capillary     Status: Abnormal   Collection Time: 07/13/22  4:58 PM  Result Value Ref Range   Glucose-Capillary 337 (H) 70 - 99 mg/dL    Comment: Glucose reference range applies only to samples taken after fasting for at least 8 hours.   Comment 1 Notify RN    Comment 2 Document in Chart   Glucose, capillary     Status: Abnormal   Collection Time: 07/13/22  9:15 PM  Result Value Ref Range   Glucose-Capillary 332 (H) 70 - 99 mg/dL    Comment: Glucose reference range applies only to samples taken after fasting for at least 8 hours.   Comment 1 Notify RN   Glucose, capillary     Status: Abnormal   Collection Time: 07/14/22  6:15 AM  Result Value Ref Range   Glucose-Capillary 296 (H) 70 - 99 mg/dL    Comment: Glucose reference range applies only to samples taken after fasting for at least 8 hours.   Comment 1 Notify RN   CK     Status: Abnormal   Collection Time: 07/14/22  7:08 AM  Result Value  Ref Range   Total CK 1,056 (H) 38 - 234 U/L    Comment: Performed at [facility], [address], [place], [place] [address]  CBC with Differential/Platelet     Status: Abnormal   Collection Time: 07/14/22  7:08 AM  Result Value Ref Range   WBC 13.8 (H) 4.0 - 10.5 K/uL   RBC 2.86 (L) 3.87 - 5.11 MIL/uL   Hemoglobin 9.0 (L) 12.0 - 15.0 g/dL   HCT 09.8 (L) 11.9 - 14.7 %   MCV 95.1 80.0 - 100.0 fL   MCH 31.5 26.0 - 34.0 pg   MCHC 33.1 30.0 - 36.0 g/dL   RDW 82.9 56.2 - 13.0 %   Platelets 192 150 - 400 K/uL   nRBC 0.0 0.0 - 0.2 %   Neutrophils Relative % 93 %   Neutro Abs 12.7 (H) 1.7 - 7.7 K/uL   Lymphocytes Relative 3 %   Lymphs Abs 0.5 (L) 0.7 - 4.0 K/uL   Monocytes Relative 2 %   Monocytes Absolute 0.3 0.1 - 1.0 K/uL   Eosinophils Relative 0 %   Eosinophils Absolute 0.0 0.0 - 0.5 K/uL   Basophils Relative 0 %   Basophils Absolute 0.0 0.0 - 0.1 K/uL   Immature Granulocytes 2 %   Abs Immature Granulocytes 0.31 (H) 0.00 - 0.07 K/uL    Comment: Performed at [facility], [address], [place], [place] [address]  Comprehensive metabolic panel     Status: Abnormal   Collection Time: 07/14/22  7:08 AM  Result Value Ref Range   Sodium 138 135 - 145 mmol/L   Potassium 4.0 3.5 - 5.1 mmol/L   Chloride 105 98 - 111 mmol/L   CO2 18 (L) 22 - 32 mmol/L   Glucose, Bld 328 (H) 70 - 99 mg/dL    Comment: Glucose reference range applies only  to samples taken after fasting for at least 8 hours.   BUN 38 (H) 6 - 20 mg/dL   Creatinine, Ser 1.61 (H) 0.44 - 1.00 mg/dL   Calcium 9.0 8.9 - 09.6 mg/dL   Total Protein 5.9 (L) 6.5 - 8.1 g/dL   Albumin 2.8 (L) 3.5 - 5.0 g/dL   AST 47 (H) 15 - 41 U/L   ALT 151 (H) 0 - 44 U/L   Alkaline Phosphatase 98 38 - 126 U/L   Total Bilirubin 0.7 0.3 - 1.2 mg/dL   GFR, Estimated 20 (L) >60 mL/min    Comment: (NOTE) Calculated using the CKD-EPI Creatinine Equation (2021)    Anion gap 15 5 - 15    Comment: Performed at [facility],  [address], [place], [place] [address]  C-reactive protein     Status: Abnormal   Collection Time: 07/14/22  7:08 AM  Result Value Ref Range   CRP 1.0 (H) <1.0 mg/dL    Comment: Performed at [facility], [address] [place], [place] [address]  Phosphorus     Status: None   Collection Time: 07/14/22  7:08 AM  Result Value Ref Range   Phosphorus 4.4 2.5 - 4.6 mg/dL    Comment: Performed at [facility], [address], [place], [place] [address]  Magnesium     Status: Abnormal   Collection Time: 07/14/22  7:08 AM  Result Value Ref Range   Magnesium 1.5 (L) 1.7 - 2.4 mg/dL    Comment: Performed at [facility], [address], [place], [place] [address]   MR FEMUR LEFT W WO CONTRAST  Result Date: 07/13/2022 CLINICAL DATA:  Muscle soreness and generalized weakness. History of myasthenia gravis. EXAM: MR OF THE LEFT LOWER EXTREMITY WITHOUT AND WITH CONTRAST TECHNIQUE: Multiplanar, multisequence MR imaging of the left lower extremity was performed both before and after administration of intravenous contrast. CONTRAST:  7mL GADAVIST GADOBUTROL 1 MMOL/ML IV SOLN COMPARISON:  None Available. FINDINGS: Edema like signal changes involving the pelvic, hip and lower extremity musculature bilaterally. This most significantly involves the posterior compartment muscles of the thighs bilaterally, left slightly greater than right. Some upper involvement of the rectus femoris muscle on the left but otherwise sparing of the quadriceps musculature. These muscles also demonstrate enhancement after contrast administration. Findings are most likely due to the patient's myasthenia gravis. Other possibilities would include post viral myositis, drug-induced myositis or polymyositis. No muscle mass. No significant bony abnormality. The hip and knee joints are maintained. The major tendons and ligaments appear intact. No significant intrapelvic abnormalities are identified. IMPRESSION: 1. Edema like  signal changes and enhancement involving the pelvic, hip and lower extremity musculature bilaterally, left slightly greater than right. Findings are most likely due to the patient's myasthenia gravis. Other possibilities would include post viral myositis, drug-induced myositis or polymyositis. No muscle mass. 2. No significant bony abnormality. Electronically Signed   By: Rudie Meyer M.D.   On: 07/13/2022 13:36      Assessment/Plan Muscle weakness and pain, possible myositis General surgery will tentatively plan for a muscle biopsy of the left quadricept Monday or early next week.  NPO after MN Sunday, continuation of SQH is ok, no need to hold chemical DVT ppx for biopsy   I reviewed nursing notes, Consultant neurology notes, hospitalist notes, last 24 h vitals and pain scores, last 48 h intake and output, last 24 h labs and trends, and last 24 h  imaging results.   Adam Phenix, [facility] 07/14/2022, 8:47 AM Please see Amion for pager number during day hours 7:00am-4:30pm or 7:00am -11:30am on weekends

## 2022-07-14 NOTE — Progress Notes (Signed)
Occupational Therapy Treatment Patient Details Name: Teresa Lee MRN: [id number] DOB: 1960/12/27 Today's Date: 07/14/2022   History of present illness Patient is a 62 year old female who presented on 4/22 with weakness and fatigue.Patient was admitted with rhabdomyolysis. PMH: anxiety, osteoarthritis, asthma, crohn's disease, fibromyalgia, myasthenia gravis, PLIF back surgery, spinal cord stimulator insertion   OT comments  Pt progressing toward LB goals and completed AE education with family looking at items that they would purchase ( long hand sponge). Pt given LB education for how to dress with AE, sitting position and family (A). Pt demonstrates sink level grooming this session with static standing. Recommendation for home based therapy remains appropriate.    Recommendations for follow up therapy are one component of a multi-disciplinary discharge planning process, led by the attending physician.  Recommendations may be updated based on patient status, additional functional criteria and insurance authorization.    Assistance Recommended at Discharge Frequent or constant Supervision/Assistance  Patient can return home with the following  A little help with walking and/or transfers;A little help with bathing/dressing/bathroom;Assistance with cooking/housework;Direct supervision/assist for medications management;Assist for transportation;Help with stairs or ramp for entrance;Direct supervision/assist for financial management   Equipment Recommendations  None recommended by OT    Recommendations for Other Services Rehab consult    Precautions / Restrictions Precautions Precautions: Fall Restrictions Weight Bearing Restrictions: No       Mobility Bed Mobility               General bed mobility comments: sitting eob    Transfers Overall transfer level: Needs assistance Equipment used: Rolling walker (2 wheels) Transfers: Sit to/from Stand Sit to Stand: Modified  independent (Device/Increase time)                 Balance Overall balance assessment: Needs assistance         Standing balance support: No upper extremity supported, During functional activity Standing balance-Leahy Scale: Good Standing balance comment: RW for ambulation                           ADL either performed or assessed with clinical judgement   ADL Overall ADL's : Needs assistance/impaired Eating/Feeding: Independent   Grooming: Wash/dry hands;Modified independent           Upper Body Dressing : Modified independent   Lower Body Dressing: Modified independent;Sit to/from stand;With adaptive equipment Lower Body Dressing Details (indicate cue type and reason): pt is able to hip hike on the bed with cues and reach foot on L side. pt does report some soreness at hip area. pt educated on AE and spouse looking at [facility] for long handle sponges. pt will also have spouse support Toilet Transfer: Modified Independent;Ambulation;Regular Toilet;Rolling walker (2 wheels) Toilet Transfer Details (indicate cue type and reason): pt needs cues for line management of the IV         Functional mobility during ADLs: Modified independent;Rolling walker (2 wheels)      Extremity/Trunk Assessment Upper Extremity Assessment Upper Extremity Assessment: Overall WFL for tasks assessed   Lower Extremity Assessment Lower Extremity Assessment: Generalized weakness        Vision       Perception     Praxis      Cognition Arousal/Alertness: Awake/alert Behavior During Therapy: WFL for tasks assessed/performed Overall Cognitive Status: Within Functional Limits for tasks assessed  Exercises      Shoulder Instructions       General Comments VSS on RA    Pertinent Vitals/ Pain       Pain Assessment Pain Assessment: Faces Faces Pain Scale: Hurts a little bit Pain Location: hip area L  side Pain Descriptors / Indicators: Discomfort Pain Intervention(s): Monitored during session, Repositioned  Home Living                                          Prior Functioning/Environment              Frequency  Min 2X/week        Progress Toward Goals  OT Goals(current goals can now be found in the care plan section)  Progress towards OT goals: Progressing toward goals  Acute Rehab OT Goals Patient Stated Goal: to eat lunch OT Goal Formulation: With patient/family Time For Goal Achievement: 07/25/22 Potential to Achieve Goals: Fair ADL Goals Pt Will Perform Lower Body Dressing: with supervision;sit to/from stand Pt Will Transfer to Toilet: with supervision;ambulating;regular height toilet Pt Will Perform Toileting - Clothing Manipulation and hygiene: with supervision;sit to/from stand  Plan Discharge plan remains appropriate    Co-evaluation                 AM-PAC OT "6 Clicks" Daily Activity     Outcome Measure   Help from another person eating meals?: None Help from another person taking care of personal grooming?: None Help from another person toileting, which includes using toliet, bedpan, or urinal?: A Little Help from another person bathing (including washing, rinsing, drying)?: A Little Help from another person to put on and taking off regular upper body clothing?: None Help from another person to put on and taking off regular lower body clothing?: A Little 6 Click Score: 21    End of Session Equipment Utilized During Treatment: Rolling walker (2 wheels)  OT Visit Diagnosis: Unsteadiness on feet (R26.81);Other abnormalities of gait and mobility (R26.89);Muscle weakness (generalized) (M62.81)   Activity Tolerance Patient tolerated treatment well   Patient Left in bed;with call bell/phone within reach;with family/visitor present   Nurse Communication Mobility status;Precautions        Time: 8119-1478 OT Time  Calculation (min): 17 min  Charges: OT General Charges $OT Visit: 1 Visit OT Treatments $Self Care/Home Management : 8-22 mins   Brynn, OTR/L  Acute Rehabilitation Services Office: [phone] .   Mateo Flow 07/14/2022, 2:36 PM

## 2022-07-14 NOTE — Inpatient Diabetes Management (Signed)
Inpatient Diabetes Program Recommendations  AACE/ADA: New Consensus Statement on Inpatient Glycemic Control (2015)  Target Ranges:  Prepandial:   less than 140 mg/dL      Peak postprandial:   less than 180 mg/dL (1-2 hours)      Critically ill patients:  140 - 180 mg/dL   Lab Results  Component Value Date   GLUCAP 239 (H) 07/14/2022   HGBA1C 8.1 (H) 07/13/2022    Review of Glycemic Control  Latest Reference Range & Units 07/13/22 16:58 07/13/22 21:15 07/14/22 06:15 07/14/22 11:09  Glucose-Capillary 70 - 99 mg/dL 161 (H) 096 (H) 045 (H) 239 (H)   Diabetes history: New Diabetes Diagnosis this admission  Current orders for Inpatient glycemic control:  Semglee 15 units Daily Novolog 0-15 units tid and hs  Solumedrol 1000 mg Daily  Spoke with patient and husband at bedside about new diabetes diagnosis.  Husband already has diabetes. Discussed A1C results (8.1%) and explained what an A1C is. Discussed basic pathophysiology of DM Type 2, basic home care, importance of checking CBGs and maintaining good CBG control to prevent long-term and short-term complications. Reviewed glucose and A1C goals. Reviewed signs and symptoms of hyperglycemia and hypoglycemia along with treatment for both. Discussed impact of nutrition, exercise, stress, sickness, and medications on diabetes control. Reviewed Living Well with diabetes booklet and encouraged patient to read through entire book. Talked about the possibility of being on insulin for a short time due to steroid doses at discharge.  Asked patient to check her glucose at least 2 times per day (fasting and a later check either before supper and/or bedtime) and to keep a log book of glucose readings and insulin taken. Explained how the doctor she follows up with can use the log book to continue to make insulin adjustments if needed.  RNs to provide ongoing basic DM education at bedside with this patient and engage patient to actively check blood glucose and  administer insulin injections.   Pt will be in the hospital for a few more days will teach insulin at a later time if warranted due to high steroid dose at discharge.  Thanks, Christena Deem RN, MSN, BC-ADM Inpatient Diabetes Coordinator Team Pager [phone] (8a-5p)

## 2022-07-14 NOTE — Progress Notes (Signed)
Patient performed a NIF with a -33 and a vital capacity of 1.84.

## 2022-07-14 NOTE — Progress Notes (Signed)
PROGRESS NOTE  Teresa Lee ZOX:[id number] DOB: 1960-05-28   PCP: [name], MD  Patient is from: Home.  Reports using rolling walker at baseline.  Lately deconditioned  DOA: 07/10/2022 LOS: 4  Chief complaints Chief Complaint  Patient presents with   Weakness     Brief Narrative / Interim history: 62 year old F with PMH of OA, HTN, HLD on statin, MG who presented to [facility] ED with fatigue and generalized weakness for about 2 weeks and admitted to [facility] with nontraumatic rhabdomyolysis, AKI and elevated liver enzymes.  CK elevated to 8700.  Started on IV fluid.  Neurology consulted and recommended transfer to [facility] and started on high-dose steroid through 07/17/2022.  Patient had left hip pain.  MRI of left femur showed muscle edema and inflammation.  General surgery consulted for biopsy.     Subjective: Seen and examined earlier this morning.  No major events overnight of this morning.  Reports having better night.  Feels rested.  Pain improved.  Blood pressure high.  New diagnosis of diabetes.  Objective: Vitals:   07/13/22 2340 07/14/22 0408 07/14/22 0758 07/14/22 1121  BP: (!) 154/80 (!) 156/89 (!) 184/100 (!) 181/79  Pulse: 77 67 (!) 52 (!) 57  Resp: 20 19 18 18   Temp: 98 F (36.7 C) 98.1 F (36.7 C) (!) 97.4 F (36.3 C) 97.7 F (36.5 C)  TempSrc: Oral Oral Oral Oral  SpO2: 96% 97% 98% 97%  Weight:      Height:        Examination:  GENERAL: No apparent distress.  Nontoxic. HEENT: MMM.  Vision and hearing grossly intact.  NECK: Supple.  No apparent JVD.  RESP:  No IWOB.  Fair aeration bilaterally. CVS:  RRR. Heart sounds normal.  ABD/GI/GU: BS+. Abd soft, NTND.  MSK/EXT:   No apparent deformity. Moves extremities.  1+ BLE edema. SKIN: no apparent skin lesion or wound NEURO: Awake and alert. Oriented appropriately.  No apparent focal neuro deficit.  BLE weakness. PSYCH: Calm. Normal affect.   Procedures:  None  Microbiology  summarized: COVID-19, influenza and RSV PCR nonreactive.  Assessment and plan: Principal Problem:   Rhabdomyolysis Active Problems:   ARF (acute renal failure) (HCC)   Non-insulin dependent type 2 diabetes mellitus (HCC)  Nontraumatic rhabdomyolysis: Has been physically deconditioned lately.  Seems she was on a statin that was discontinued indefinitely.  Has history of myasthenia gravis.  CK improving.  CK 0.1.  ESR 78. -Decrease IV fluid to 100 cc an hour due to edema. -Continue Solu-Medrol 1 g daily for 5 days per neurology -Continue monitoring  Myasthenia gravis: No signs of exacerbation.  On CellCept, prednisone, Mestinon at home. -Continue home CellCept, Mestinon -Hold prednisone while on Solu-Medrol -Continue NIF/VC per RT-neurology recommends elective intubation for signs of respiratory compromise  Left and right hip pain/osteoarthritis: Right hip x-ray without acute finding.  MRI of left femur with muscle edema and inflammation -Neurology recommended muscle biopsy.  General surgery consulted. -Expect steroid to help with pain. -Continue home Robaxin  Elevated LFTs: Likely due to rhabdo.  Improving. Continue monitoring   AKI: Cr trended up despite improvement in rhabdo.  Improving. Recent Labs    09/16/21 0826 09/17/21 0621 09/18/21 0622 09/19/21 0800 07/10/22 1252 07/10/22 1755 07/11/22 0643 07/12/22 0545 07/13/22 0446 07/14/22 0708  BUN 29* 21* 12 14 55*  --  46* 40* 33* 38*  CREATININE 2.04* 1.56* 1.50* 1.33* 2.76* 2.65* 2.61* 2.59* 2.90* 2.64*  -Continue IV fluid -Avoid nephrotoxic  meds -Avoid nephrotoxic meds  New NIDDM-2 with hyperglycemia: A1c 8.1%.  Hyperglycemia likely due to steroid. Recent Labs  Lab 07/13/22 1658 07/13/22 2115 07/14/22 0615 07/14/22 1109  GLUCAP 337* 332* 296* 239*  -Increase Semglee to 15 units daily -Continue SSI-moderate -Diabetic coordinator consulted -Check lipid panel  Leukocytosis: Mild.  Likely from steroid.    Hypokalemia/hypomagnesemia -Monitor replenish as appropriate   Elevated TSH: TSH 10.4.  Free T4 is 0.66. -Recheck outpatient   Acute metabolic acidosis: Likely due to IV fluid. -Continue monitoring  HTN: BP slightly elevated. -Continue Toprol-XL -Schedule low-dose hydralazine -Continue holding losartan.   Obesity Body mass index is 31.39 kg/m. -Counseled on lifestyle change          DVT prophylaxis:  heparin injection 5,000 Units Start: 07/10/22 2200  Code Status: Full code Family Communication: None at bedside Level of care: Progressive Status is: Inpatient Remains inpatient appropriate because: Nontraumatic rhabdomyolysis requiring IV fluid and high-dose steroid   Final disposition: TBD Consultants:  Neurology General surgery  55 minutes with more than 50% spent in reviewing records, counseling patient/family and coordinating care.   Sch Meds:  Scheduled Meds:  [START ON 07/24/2022] cyanocobalamin  1,000 mcg Intramuscular Q2 months   desmopressin  0.6 mg Oral QHS   estradiol  2 mg Oral QHS   heparin  5,000 Units Subcutaneous Q8H   hydrALAZINE  25 mg Oral Q8H   insulin aspart  0-15 Units Subcutaneous TID AC & HS   insulin glargine-yfgn  15 Units Subcutaneous Daily   methocarbamol  750 mg Oral QHS   metoprolol succinate  25 mg Oral QHS   mycophenolate  1,000 mg Oral BID   pantoprazole  40 mg Oral Daily   pregabalin  75 mg Oral QHS   progesterone  100 mg Oral QHS   rOPINIRole  0.5 mg Oral QHS   Continuous Infusions:  lactated ringers 100 mL/hr at 07/14/22 0406   methylPREDNISolone (SOLU-MEDROL) injection 1,000 mg (07/13/22 1802)   PRN Meds:.acetaminophen, albuterol, butalbital-acetaminophen-caffeine, diphenhydrAMINE, HYDROcodone-acetaminophen, HYDROmorphone (DILAUDID) injection, morphine injection, ondansetron (ZOFRAN) IV, ondansetron, pyridostigmine  Antimicrobials: Anti-infectives (From admission, onward)    None        I have personally  reviewed the following labs and images: CBC: Recent Labs  Lab 07/10/22 1252 07/10/22 1755 07/11/22 0643 07/12/22 0545 07/13/22 0446 07/14/22 0708  WBC 17.9* 13.9* 13.1* 11.2* 10.9* 13.8*  NEUTROABS 13.5*  --   --   --  9.9* 12.7*  HGB 11.8* 10.6* 9.7* 10.1* 9.6* 9.0*  HCT 34.9* 32.8* 29.6* 30.4* 26.9* 27.2*  MCV 93.6 98.5 97.7 97.7 89.7 95.1  PLT 227 180 179 181 166 192   BMP &GFR Recent Labs  Lab 07/10/22 1252 07/10/22 1755 07/11/22 0643 07/12/22 0545 07/13/22 0446 07/14/22 0708  NA 135  --  141 138 135 138  K 3.5  --  3.6 2.9* 4.0 4.0  CL 108  --  112* 108 104 105  CO2 15*  --  18* 19* 17* 18*  GLUCOSE 180*  --  130* 131* 325* 328*  BUN 55*  --  46* 40* 33* 38*  CREATININE 2.76* 2.65* 2.61* 2.59* 2.90* 2.64*  CALCIUM 8.9  --  8.2* 9.2 9.1 9.0  MG  --   --   --  1.6*  --  1.5*  PHOS  --   --   --   --   --  4.4   Estimated Creatinine Clearance: 20.2 mL/min (A) (by C-G formula based on  SCr of 2.64 mg/dL (H)). Liver & Pancreas: Recent Labs  Lab 07/10/22 1252 07/11/22 0643 07/12/22 0545 07/13/22 0446 07/14/22 0708  AST 202* 147* 153* 94* 47*  ALT 223* 174* 193* 179* 151*  ALKPHOS 115 80 143* 130* 98  BILITOT 0.7 0.7 0.9 0.9 0.7  PROT 7.0 5.3* 5.9* 5.5* 5.9*  ALBUMIN 3.3* 2.7* 2.8* 2.5* 2.8*   No results for input(s): "LIPASE", "AMYLASE" in the last 168 hours. No results for input(s): "AMMONIA" in the last 168 hours. Diabetic: Recent Labs    07/13/22 0446  HGBA1C 8.1*   Recent Labs  Lab 07/13/22 1658 07/13/22 2115 07/14/22 0615 07/14/22 1109  GLUCAP 337* 332* 296* 239*   Cardiac Enzymes: Recent Labs  Lab 07/10/22 1252 07/11/22 0643 07/12/22 0545 07/13/22 0446 07/14/22 0708  CKTOTAL 8,200* 5,803* 3,927* 2,648* 1,056*   No results for input(s): "PROBNP" in the last 8760 hours. Coagulation Profile: No results for input(s): "INR", "PROTIME" in the last 168 hours. Thyroid Function Tests: Recent Labs    07/12/22 0545 07/13/22 0446  TSH  10.483*  --   FREET4  --  0.66   Lipid Profile: No results for input(s): "CHOL", "HDL", "LDLCALC", "TRIG", "CHOLHDL", "LDLDIRECT" in the last 72 hours. Anemia Panel: No results for input(s): "VITAMINB12", "FOLATE", "FERRITIN", "TIBC", "IRON", "RETICCTPCT" in the last 72 hours. Urine analysis:    Component Value Date/Time   COLORURINE BROWN (A) 07/10/2022 1350   APPEARANCEUR CLOUDY (A) 07/10/2022 1350   LABSPEC 1.025 07/10/2022 1350   PHURINE 5.0 07/10/2022 1350   GLUCOSEU NEGATIVE 07/10/2022 1350   HGBUR LARGE (A) 07/10/2022 1350   BILIRUBINUR NEGATIVE 07/10/2022 1350   KETONESUR NEGATIVE 07/10/2022 1350   PROTEINUR >=300 (A) 07/10/2022 1350   NITRITE NEGATIVE 07/10/2022 1350   LEUKOCYTESUR NEGATIVE 07/10/2022 1350   Sepsis Labs: Invalid input(s): "PROCALCITONIN", "LACTICIDVEN"  Microbiology: Recent Results (from the past 240 hour(s))  Resp panel by RT-PCR (RSV, Flu A&B, Covid) Urine, Clean Catch     Status: None   Collection Time: 07/10/22  1:39 PM   Specimen: Urine, Clean Catch; Nasal Swab  Result Value Ref Range Status   SARS Coronavirus 2 by RT PCR NEGATIVE NEGATIVE Final    Comment: (NOTE) SARS-CoV-2 target nucleic acids are NOT DETECTED.  The SARS-CoV-2 RNA is generally detectable in upper respiratory specimens during the acute phase of infection. The lowest concentration of SARS-CoV-2 viral copies this assay can detect is 138 copies/mL. A negative result does not preclude SARS-Cov-2 infection and should not be used as the sole basis for treatment or other patient management decisions. A negative result may occur with  improper specimen collection/handling, submission of specimen other than nasopharyngeal swab, presence of viral mutation(s) within the areas targeted by this assay, and inadequate number of viral copies(<138 copies/mL). A negative result must be combined with clinical observations, patient history, and epidemiological information. The expected result  is Negative.  Fact Sheet for Patients:  BloggerCourse.com  Fact Sheet for Healthcare Providers:  SeriousBroker.it  This test is no t yet approved or cleared by the [place] FDA and  has been authorized for detection and/or diagnosis of SARS-CoV-2 by FDA under an Emergency Use Authorization (EUA). This EUA will remain  in effect (meaning this test can be used) for the duration of the COVID-19 declaration under Section 564(b)(1) of the Act, 21 U.S.C.section 360bbb-3(b)(1), unless the authorization is terminated  or revoked sooner.       Influenza A by PCR NEGATIVE NEGATIVE Final   Influenza  B by PCR NEGATIVE NEGATIVE Final    Comment: (NOTE) The Xpert Xpress SARS-CoV-2/FLU/RSV plus assay is intended as an aid in the diagnosis of influenza from Nasopharyngeal swab specimens and should not be used as a sole basis for treatment. Nasal washings and aspirates are unacceptable for Xpert Xpress SARS-CoV-2/FLU/RSV testing.  Fact Sheet for Patients: BloggerCourse.com  Fact Sheet for Healthcare Providers: SeriousBroker.it  This test is not yet approved or cleared by the [place] FDA and has been authorized for detection and/or diagnosis of SARS-CoV-2 by FDA under an Emergency Use Authorization (EUA). This EUA will remain in effect (meaning this test can be used) for the duration of the COVID-19 declaration under Section 564(b)(1) of the Act, 21 U.S.C. section 360bbb-3(b)(1), unless the authorization is terminated or revoked.     Resp Syncytial Virus by PCR NEGATIVE NEGATIVE Final    Comment: (NOTE) Fact Sheet for Patients: BloggerCourse.com  Fact Sheet for Healthcare Providers: SeriousBroker.it  This test is not yet approved or cleared by the [place] FDA and has been authorized for detection and/or diagnosis of  SARS-CoV-2 by FDA under an Emergency Use Authorization (EUA). This EUA will remain in effect (meaning this test can be used) for the duration of the COVID-19 declaration under Section 564(b)(1) of the Act, 21 U.S.C. section 360bbb-3(b)(1), unless the authorization is terminated or revoked.  Performed at [facility], [address] [place], [place] [address]     Radiology Studies: No results found.    Siriah Treat T. Catherene Kaleta [facility]  If 7PM-7AM, please contact night-coverage [email or web address] 07/14/2022, 2:00 PM

## 2022-07-14 NOTE — Progress Notes (Signed)
NIF -30 x 2 efforts VC 1.75 L x 2 efforts  Pt appears to have  good effort

## 2022-07-14 NOTE — Progress Notes (Signed)
NEUROLOGY CONSULTATION PROGRESS NOTE   Date of service: July 14, 2022 Patient Name: Teresa Lee MRN:  [id number] DOB:  January 21, 1961  Brief HPI  Teresa Lee is a 62 y.o. female with PMH significant for myasthenia gravis on as needed Mestinon and CellCept 1000 twice daily, fibromyalgia, GERD, hypertension, hyperlipidemia who is currently admitted to the hospital with progressive generalized weakness and muscle aches over the last few months.  She was found to have rhabdomyolysis and AKI.  Overall, presentation is concerning for idiopathic inflammatory myositis. No skin lesions concerning for dermatomyositis. Could be statin induced myositis, polymyositis, inclusion body myositis, antisynthetase syndrome, myositis secondary to rheum disorder or overlap syndrome. No viral symptoms around the time of onset of these symptoms that patient can recall and thus not particularly concerning for viral myositis.   I think she is stable from a myasthenia standpoint and is not experiencing her typical MG symptoms.  Interval Hx   S/p 2 doses of solumedrol and tolerated fine. Denies any significant improvement. Creatinine stabilized.  Vitals   Vitals:   07/13/22 2340 07/14/22 0408 07/14/22 0758 07/14/22 1121  BP: (!) 154/80 (!) 156/89 (!) 184/100 (!) 181/79  Pulse: 77 67 (!) 52 (!) 57  Resp: 20 19 18 18   Temp: 98 F (36.7 C) 98.1 F (36.7 C) (!) 97.4 F (36.3 C) 97.7 F (36.5 C)  TempSrc: Oral Oral Oral Oral  SpO2: 96% 97% 98% 97%  Weight:      Height:         Body mass index is 31.39 kg/m.  Physical Exam   General: Laying comfortably in bed; in no acute distress.  HENT: Normal oropharynx and mucosa. Normal external appearance of ears and nose.  Neck: Supple, no pain or tenderness  CV: No JVD. No peripheral edema.  Pulmonary: Symmetric Chest rise. Normal respiratory effort.  Abdomen: Soft to touch, non-tender.  Ext: No cyanosis, edema, or deformity  Skin: No rash. Normal  palpation of skin.   Musculoskeletal: Normal digits and nails by inspection. No clubbing.   Neurologic Examination  Mental status/Cognition: Alert, oriented to self, place, month and year, good attention.  Speech/language: Fluent, comprehension intact, object naming intact, repetition intact.  Cranial nerves:   CN II Pupils equal and reactive to light, no VF deficits    CN III,IV,VI EOM intact, no gaze preference or deviation, no nystagmus    CN V normal sensation in V1, V2, and V3 segments bilaterally    CN VII no asymmetry, no nasolabial fold flattening    CN VIII normal hearing to speech    CN IX & X normal palatal elevation, no uvular deviation    CN XI 5/5 head turn and 5/5 shoulder shrug bilaterally   CN XII midline tongue protrusion    Motor:  Muscle bulk: normal, tone normal, pronator drift none,  tremor none Mvmt Root Nerve  Muscle Right Left Comments  SA C5/6 Ax Deltoid 4+ 4+   EF C5/6 Mc Biceps 5 5   EE C6/7/8 Rad Triceps 5 5   WF C6/7 Med FCR     WE C7/8 PIN ECU     F Ab C8/T1 U ADM/FDI 5 5   HF L1/2/3 Fem Illopsoas 4+ 4+   KE L2/3/4 Fem Quad 5 5   DF L4/5 D Peron Tib Ant 5 5   PF S1/2 Tibial Grc/Sol 5 5     Sensation:  Light touch Intact throughout   Pin prick    Temperature  Vibration   Proprioception    Coordination/Complex Motor:  - Finger to Nose intact BL - Heel to shin unable to do again due to pain today - Rapid alternating movement are slowed - Gait: deferred again for her safety.  Labs   Basic Metabolic Panel:  Lab Results  Component Value Date   NA 138 07/14/2022   K 4.0 07/14/2022   CO2 18 (L) 07/14/2022   GLUCOSE 328 (H) 07/14/2022   BUN 38 (H) 07/14/2022   CREATININE 2.64 (H) 07/14/2022   CALCIUM 9.0 07/14/2022   GFRNONAA 20 (L) 07/14/2022   GFRAA >60 02/25/2019   HbA1c:  Lab Results  Component Value Date   HGBA1C 8.1 (H) 07/13/2022   LDL:  Lab Results  Component Value Date   LDLCALC 162 (H) 08/08/2013   Urine Drug  Screen: No results found for: "LABOPIA", "COCAINSCRNUR", "LABBENZ", "AMPHETMU", "THCU", "LABBARB"  Alcohol Level No results found for: "ETH" No results found for: "PHENYTOIN", "ZONISAMIDE", "LAMOTRIGINE", "LEVETIRACETA" No results found for: "PHENYTOIN", "PHENOBARB", "VALPROATE", "CBMZ"  Imaging and Diagnostic studies   CT Head without contrast(Personally reviewed): 1.  No evidence of an acute intracranial abnormality. 2. Mild cerebral white matter disease, nonspecific but most often secondary to chronic small vessel ischemia. 3. Redemonstrated prominent perivascular space versus chronic lacunar infarct within the right external capsule/basal ganglia. 4. Unchanged from prior examinations, there is focal irregularity and dilation of the anterior body of the right lateral ventricle. Although not definitively identified, closed lip schizencephaly cannot be excluded. Consider a non-emergent brain MRI for further evaluation (if not already performed at an outside institution). 5. Absence of the septum pellucidum.  Lab Results  Component Value Date   CKTOTAL 1,056 (H) 07/14/2022   Lab Results  Component Value Date   NA 138 07/14/2022   K 4.0 07/14/2022   CO2 18 (L) 07/14/2022   GLUCOSE 328 (H) 07/14/2022   BUN 38 (H) 07/14/2022   CREATININE 2.64 (H) 07/14/2022   CALCIUM 9.0 07/14/2022   GFRNONAA 20 (L) 07/14/2022   MRI L femur w + w/o C:  NIF: -30 VC: 1.75  Impression   Teresa Lee is a 62 y.o. female with PMH significant for myasthenia gravis on as needed Mestinon and CellCept 1000 twice daily, fibromyalgia, GERD, hypertension, hyperlipidemia who is currently admitted to the hospital with progressive generalized weakness and muscle aches over the last few months.  She was found to have rhabdomyolysis and AKI.  Overall, presentation is concerning for idiopathic inflammatory myositis. No skin lesions concerning for dermatomyositis. Could be statin induced myositis,  polymyositis, inclusion body myositis, antisynthetase syndrome, myositis secondary to rheum disorder or overlap syndrome. No viral symptoms around the time of onset of these symptoms that patient can recall and thus not particularly concerning for viral myositis.   I think she is stable from a myasthenia standpoint and is not experiencing her typical MG symptoms.  Recommendations  - cont IV solumedrol 1000mg  daily x 5 doses. Will need a prolonged PO taper afterwards. - ANA IFA, ENA panel, serum Myomarker 3 plus panel is pending. - MRI left femur with and without contrast demonstrates muscle edema and inflammation - Gen surg team planning muscle biopsy today. - Cardiac Monitoring. - Every 12 hours hours NIFs and VC starting tonight. - Recommend elective intubation for respiratory compromise if VC falls below 15 to 20 mL/kg and or NIFs falls below -20cm/H2O. If poor effort and not sure, can always get ABG to assess for CO2 retention. Elective intubation  for airway cmopromise if she has difficulty clearing her secretions. Oxygen saturation should not be used to make decision regarding intubation. - Recommend Mestinon PO 30mg  TID PRN. If unable to swallow, can switch from PO to IV.(30mg  PO is equivalent to 1mg  IV). She rarely takes it at home. - continue Cellcept 1000mg  BID. - recommend monitoring on progressive floor for the next 2 days. Steroids can temporarily worsen Myasthenia gravis. - Medications that may worsen or trigger MG exacerbation: Class IA antiarrhythmics, magnesium, flouroquinolones, macrolides, aminoglycosides, penicillamine, curare, interferon alpha, botox, quinine. Use with caution: calcium channel blocker, beta blockers and statins. - already established with Neuromuscular clinic at [facility]. Follow up with [facility] at discharge. ______________________________________________________________________   Thank you for the opportunity to take part in the care of this patient. If you  have any further questions, please contact the neurology consultation attending.  Signed,  Erick Blinks [facility] Pager Number [phone]

## 2022-07-14 NOTE — Progress Notes (Signed)
Physical Therapy Treatment Patient Details Name: Teresa Lee MRN: [id number] DOB: [birth year] Today's Date: 07/14/2022   History of Present Illness Patient is a 62 year old female who presented on 4/22 with weakness and fatigue.Patient was admitted with rhabdomyolysis. PMH: anxiety, osteoarthritis, asthma, crohn's disease, fibromyalgia, myasthenia gravis, PLIF back surgery, spinal cord stimulator insertion    PT Comments    Pt received in bed, eager to participate in therapy. She required supervision bed mobility, supervision transfers, and min guard assist ambulation 150' with RW. Multiple standing rest breaks during last 11' due to quad fatigue and 1/4 DOE. HR 87 after amb. Pt returned to supine in bed at end of session.    Recommendations for follow up therapy are one component of a multi-disciplinary discharge planning process, led by the attending physician.  Recommendations may be updated based on patient status, additional functional criteria and insurance authorization.  Follow Up Recommendations       Assistance Recommended at Discharge Intermittent Supervision/Assistance  Patient can return home with the following A little help with walking and/or transfers;A little help with bathing/dressing/bathroom;Assistance with cooking/housework;Assist for transportation;Help with stairs or ramp for entrance   Equipment Recommendations  Rollator (4 wheels)    Recommendations for Other Services       Precautions / Restrictions Precautions Precautions: Fall Restrictions Weight Bearing Restrictions: No     Mobility  Bed Mobility Overal bed mobility: Needs Assistance Bed Mobility: Supine to Sit, Sit to Supine     Supine to sit: Supervision Sit to supine: Supervision        Transfers Overall transfer level: Needs assistance Equipment used: Rolling walker (2 wheels) Transfers: Sit to/from Stand Sit to Stand: Supervision           General transfer comment: cues  for hand placement    Ambulation/Gait Ambulation/Gait assistance: Min guard Gait Distance (Feet): 150 Feet Assistive device: Rolling walker (2 wheels) Gait Pattern/deviations: Step-through pattern, Decreased stride length Gait velocity: decreased Gait velocity interpretation: 1.31 - 2.62 ft/sec, indicative of limited community ambulator   General Gait Details: Pt with c/o quad fatigue. Multiple standing rest breaks during last 50' of gait. 1/4 DOE. Cues for pursed lip breathing.   Stairs             Wheelchair Mobility    Modified Rankin (Stroke Patients Only)       Balance Overall balance assessment: Needs assistance Sitting-balance support: Feet supported, No upper extremity supported Sitting balance-Leahy Scale: Good     Standing balance support: During functional activity, Bilateral upper extremity supported, No upper extremity supported Standing balance-Leahy Scale: Fair Standing balance comment: RW for amb, static stand without support                            Cognition Arousal/Alertness: Awake/alert Behavior During Therapy: WFL for tasks assessed/performed Overall Cognitive Status: Within Functional Limits for tasks assessed                                          Exercises      General Comments General comments (skin integrity, edema, etc.): HR 87 after amb      Pertinent Vitals/Pain Pain Assessment Pain Assessment: Faces Faces Pain Scale: Hurts a little bit Pain Location: BLE Pain Descriptors / Indicators: Discomfort Pain Intervention(s): Monitored during session, Limited activity within patient's tolerance  Home Living                          Prior Function            PT Goals (current goals can now be found in the care plan section) Acute Rehab PT Goals Patient Stated Goal: home Progress towards PT goals: Progressing toward goals    Frequency    Min 3X/week      PT Plan Current  plan remains appropriate    Co-evaluation              AM-PAC PT "6 Clicks" Mobility   Outcome Measure  Help needed turning from your back to your side while in a flat bed without using bedrails?: None Help needed moving from lying on your back to sitting on the side of a flat bed without using bedrails?: A Little Help needed moving to and from a bed to a chair (including a wheelchair)?: A Little Help needed standing up from a chair using your arms (e.g., wheelchair or bedside chair)?: A Little Help needed to walk in hospital room?: A Little Help needed climbing 3-5 steps with a railing? : A Lot 6 Click Score: 18    End of Session Equipment Utilized During Treatment: Gait belt Activity Tolerance: Patient tolerated treatment well Patient left: in bed;with bed alarm set;with family/visitor present;with call bell/phone within reach Nurse Communication: Mobility status PT Visit Diagnosis: Unsteadiness on feet (R26.81);Other abnormalities of gait and mobility (R26.89);Muscle weakness (generalized) (M62.81)     Time: 1610-9604 PT Time Calculation (min) (ACUTE ONLY): 18 min  Charges:  $Gait Training: 8-22 mins                     Ferd Glassing., PT  Office # [phone]    Ilda Foil 07/14/2022, 9:41 AM

## 2022-07-15 DIAGNOSIS — M6282 Rhabdomyolysis: Secondary | ICD-10-CM | POA: Diagnosis not present

## 2022-07-15 LAB — CBC WITH DIFFERENTIAL/PLATELET
Abs Immature Granulocytes: 0.76 10*3/uL — ABNORMAL HIGH (ref 0.00–0.07)
Basophils Absolute: 0.1 10*3/uL (ref 0.0–0.1)
Basophils Relative: 0 %
Eosinophils Absolute: 0 10*3/uL (ref 0.0–0.5)
Eosinophils Relative: 0 %
HCT: 26.9 % — ABNORMAL LOW (ref 36.0–46.0)
Hemoglobin: 8.9 g/dL — ABNORMAL LOW (ref 12.0–15.0)
Immature Granulocytes: 5 %
Lymphocytes Relative: 3 %
Lymphs Abs: 0.5 10*3/uL — ABNORMAL LOW (ref 0.7–4.0)
MCH: 31.4 pg (ref 26.0–34.0)
MCHC: 33.1 g/dL (ref 30.0–36.0)
MCV: 95.1 fL (ref 80.0–100.0)
Monocytes Absolute: 0.6 10*3/uL (ref 0.1–1.0)
Monocytes Relative: 4 %
Neutro Abs: 14.2 10*3/uL — ABNORMAL HIGH (ref 1.7–7.7)
Neutrophils Relative %: 88 %
Platelets: 181 10*3/uL (ref 150–400)
RBC: 2.83 MIL/uL — ABNORMAL LOW (ref 3.87–5.11)
RDW: 13.5 % (ref 11.5–15.5)
WBC: 16.1 10*3/uL — ABNORMAL HIGH (ref 4.0–10.5)
nRBC: 0 % (ref 0.0–0.2)

## 2022-07-15 LAB — COMPREHENSIVE METABOLIC PANEL
ALT: 132 U/L — ABNORMAL HIGH (ref 0–44)
AST: 34 U/L (ref 15–41)
Albumin: 2.9 g/dL — ABNORMAL LOW (ref 3.5–5.0)
Alkaline Phosphatase: 89 U/L (ref 38–126)
Anion gap: 16 — ABNORMAL HIGH (ref 5–15)
BUN: 39 mg/dL — ABNORMAL HIGH (ref 6–20)
CO2: 20 mmol/L — ABNORMAL LOW (ref 22–32)
Calcium: 8.6 mg/dL — ABNORMAL LOW (ref 8.9–10.3)
Chloride: 101 mmol/L (ref 98–111)
Creatinine, Ser: 2.67 mg/dL — ABNORMAL HIGH (ref 0.44–1.00)
GFR, Estimated: 20 mL/min — ABNORMAL LOW (ref >60)
Glucose, Bld: 323 mg/dL — ABNORMAL HIGH (ref 70–99)
Potassium: 3.2 mmol/L — ABNORMAL LOW (ref 3.5–5.1)
Sodium: 137 mmol/L (ref 135–145)
Total Bilirubin: 0.6 mg/dL (ref 0.3–1.2)
Total Protein: 6 g/dL — ABNORMAL LOW (ref 6.5–8.1)

## 2022-07-15 LAB — GLUCOSE, CAPILLARY
Glucose-Capillary: 305 mg/dL — ABNORMAL HIGH (ref 70–99)
Glucose-Capillary: 392 mg/dL — ABNORMAL HIGH (ref 70–99)
Glucose-Capillary: 327 mg/dL — ABNORMAL HIGH (ref 70–99)
Glucose-Capillary: 296 mg/dL — ABNORMAL HIGH (ref 70–99)

## 2022-07-15 LAB — ANTIEXTRACTABLE NUCLEAR AG
ENA SM Ab Ser-aCnc: <0.2 AI (ref 0.0–0.9)
Ribonucleic Protein: <0.2 AI (ref 0.0–0.9)

## 2022-07-15 LAB — MAGNESIUM: Magnesium: 2 mg/dL (ref 1.7–2.4)

## 2022-07-15 LAB — CK: Total CK: 676 U/L — ABNORMAL HIGH (ref 38–234)

## 2022-07-15 LAB — PHOSPHORUS: Phosphorus: 4.3 mg/dL (ref 2.5–4.6)

## 2022-07-15 MED ORDER — INSULIN GLARGINE-YFGN 100 UNIT/ML ~~LOC~~ SOLN
10.0000 [IU] | SUBCUTANEOUS | Status: AC
  Administered 2022-07-15: 10 [IU] via SUBCUTANEOUS
  Filled 2022-07-15: qty 0.1

## 2022-07-15 MED ORDER — INSULIN GLARGINE-YFGN 100 UNIT/ML ~~LOC~~ SOLN
26.0000 [IU] | Freq: Every day | SUBCUTANEOUS | Status: DC
  Filled 2022-07-15: qty 0.26

## 2022-07-15 MED ORDER — POTASSIUM CHLORIDE CRYS ER 20 MEQ PO TBCR
40.0000 meq | EXTENDED_RELEASE_TABLET | Freq: Once | ORAL | Status: AC
  Administered 2022-07-15: 40 meq via ORAL
  Filled 2022-07-15: qty 2

## 2022-07-15 MED ORDER — INSULIN ASPART 100 UNIT/ML IJ SOLN
6.0000 [IU] | Freq: Three times a day (TID) | INTRAMUSCULAR | Status: DC
  Administered 2022-07-15 – 2022-07-16 (×3): 6 [IU] via SUBCUTANEOUS

## 2022-07-15 NOTE — Progress Notes (Signed)
Mobility Specialist: Progress Note   07/15/22 1326  Mobility  Activity Ambulated with assistance in hallway  Level of Assistance Contact guard assist, steadying assist  Assistive Device Front wheel walker  Distance Ambulated (ft) 200 ft  Activity Response Tolerated well  Mobility Referral Yes  $Mobility charge 1 Mobility   During Mobility: 96 HR Post-Mobility: 82 HR  Pt received in the bed and agreeable to mobility. Mod I with bed mobility and contact guard during ambulation. Stopped x2 for standing breaks secondary to mild SOB and general fatigue. Pt sitting EOB after session with call bell and phone in reach. Family present in the room.   Josee Speece Mobility Specialist Please contact via SecureChat or Rehab office at [phone]

## 2022-07-15 NOTE — Progress Notes (Signed)
NEUROLOGY CONSULTATION PROGRESS NOTE   Date of service: July 15, 2022 Patient Name: Teresa Lee MRN:  [id number] DOB:  07/27/1960  Brief HPI  Zitlali Primm is a 62 y.o. female with PMH significant for myasthenia gravis on as needed Mestinon and CellCept 1000 twice daily, fibromyalgia, GERD, hypertension, hyperlipidemia who is currently admitted to the hospital with progressive generalized weakness and muscle aches over the last few months.  She was found to have rhabdomyolysis and AKI.  Overall, presentation is concerning for idiopathic inflammatory myositis. No skin lesions concerning for dermatomyositis. Could be statin induced myositis, polymyositis, inclusion body myositis, antisynthetase syndrome, myositis secondary to rheum disorder or overlap syndrome. No viral symptoms around the time of onset of these symptoms that patient can recall and thus not particularly concerning for viral myositis.   I think she is stable from a myasthenia standpoint and is not experiencing her typical MG symptoms.  Interval Hx   S/p 3 doses of solumedrol and tolerated fine. Feeling short of breath last night but improved after she was given Lasix. Feeling a little better. Legs downt shake as much when she walks with a walker. Muscles are less sore.  Vitals   Vitals:   07/14/22 2240 07/14/22 2335 07/15/22 0326 07/15/22 0854  BP: (!) 171/93 (!) 161/81 139/64 119/70  Pulse: (!) 53 (!) 57 (!) 49 72  Resp: 13 13 14    Temp: 97.8 F (36.6 C) (!) 97.5 F (36.4 C) (!) 97.5 F (36.4 C) 97.8 F (36.6 C)  TempSrc: Oral Oral Oral Oral  SpO2: 100% 97% 99% 98%  Weight:      Height:         Body mass index is 31.39 kg/m.  Physical Exam   General: Laying comfortably in bed; in no acute distress.  HENT: Normal oropharynx and mucosa. Normal external appearance of ears and nose.  Neck: Supple, no pain or tenderness  CV: No JVD. No peripheral edema.  Pulmonary: Symmetric Chest rise. Normal  respiratory effort.  Abdomen: Soft to touch, non-tender.  Ext: No cyanosis, edema, or deformity  Skin: No rash. Normal palpation of skin.   Musculoskeletal: Normal digits and nails by inspection. No clubbing.   Neurologic Examination  Mental status/Cognition: Alert, oriented to self, place, month and year, good attention.  Speech/language: Fluent, comprehension intact, object naming intact, repetition intact.  Cranial nerves:   CN II Pupils equal and reactive to light, no VF deficits    CN III,IV,VI EOM intact, no gaze preference or deviation, no nystagmus    CN V normal sensation in V1, V2, and V3 segments bilaterally    CN VII no asymmetry, no nasolabial fold flattening    CN VIII normal hearing to speech    CN IX & X normal palatal elevation, no uvular deviation    CN XI 5/5 head turn and 5/5 shoulder shrug bilaterally   CN XII midline tongue protrusion    Motor:  Muscle bulk: normal, tone normal, pronator drift none,  tremor none Mvmt Root Nerve  Muscle Right Left Comments  SA C5/6 Ax Deltoid 4+ 4+   EF C5/6 Mc Biceps 5 5   EE C6/7/8 Rad Triceps 5 5   WF C6/7 Med FCR     WE C7/8 PIN ECU     F Ab C8/T1 U ADM/FDI 5 5   HF L1/2/3 Fem Illopsoas 4+ 4+   KE L2/3/4 Fem Quad 5 5   DF L4/5 D Peron Tib Ant 5 5  PF S1/2 Tibial Grc/Sol 5 5     Sensation:  Light touch Intact throughout   Pin prick    Temperature    Vibration   Proprioception    Coordination/Complex Motor:  - Finger to Nose intact BL - Heel to shin unable to do again due to pain today - Rapid alternating movement are slowed - Gait: deferred again for her safety.  Labs   Basic Metabolic Panel:  Lab Results  Component Value Date   NA 137 07/15/2022   K 3.2 (L) 07/15/2022   CO2 20 (L) 07/15/2022   GLUCOSE 323 (H) 07/15/2022   BUN 39 (H) 07/15/2022   CREATININE 2.67 (H) 07/15/2022   CALCIUM 8.6 (L) 07/15/2022   GFRNONAA 20 (L) 07/15/2022   GFRAA >60 02/25/2019   HbA1c:  Lab Results  Component Value  Date   HGBA1C 8.1 (H) 07/13/2022   LDL:  Lab Results  Component Value Date   LDLCALC 162 (H) 08/08/2013   Urine Drug Screen: No results found for: "LABOPIA", "COCAINSCRNUR", "LABBENZ", "AMPHETMU", "THCU", "LABBARB"  Alcohol Level No results found for: "ETH" No results found for: "PHENYTOIN", "ZONISAMIDE", "LAMOTRIGINE", "LEVETIRACETA" No results found for: "PHENYTOIN", "PHENOBARB", "VALPROATE", "CBMZ"  Imaging and Diagnostic studies   CT Head without contrast(Personally reviewed): 1.  No evidence of an acute intracranial abnormality. 2. Mild cerebral white matter disease, nonspecific but most often secondary to chronic small vessel ischemia. 3. Redemonstrated prominent perivascular space versus chronic lacunar infarct within the right external capsule/basal ganglia. 4. Unchanged from prior examinations, there is focal irregularity and dilation of the anterior body of the right lateral ventricle. Although not definitively identified, closed lip schizencephaly cannot be excluded. Consider a non-emergent brain MRI for further evaluation (if not already performed at an outside institution). 5. Absence of the septum pellucidum.  Lab Results  Component Value Date   CKTOTAL 676 (H) 07/15/2022   Lab Results  Component Value Date   NA 137 07/15/2022   K 3.2 (L) 07/15/2022   CO2 20 (L) 07/15/2022   GLUCOSE 323 (H) 07/15/2022   BUN 39 (H) 07/15/2022   CREATININE 2.67 (H) 07/15/2022   CALCIUM 8.6 (L) 07/15/2022   GFRNONAA 20 (L) 07/15/2022   MRI L femur w + w/o C:  NIF: -30 VC: 1.75  Impression   Teresa Lee is a 62 y.o. female with PMH significant for myasthenia gravis on as needed Mestinon and CellCept 1000 twice daily, fibromyalgia, GERD, hypertension, hyperlipidemia who is currently admitted to the hospital with progressive generalized weakness and muscle aches over the last few months.  She was found to have rhabdomyolysis and AKI.  Overall, presentation is  concerning for idiopathic inflammatory myositis. No skin lesions concerning for dermatomyositis. Could be statin induced myositis, polymyositis, inclusion body myositis, antisynthetase syndrome, myositis secondary to rheum disorder or overlap syndrome. No viral symptoms around the time of onset of these symptoms that patient can recall and thus not particularly concerning for viral myositis.   I think she is stable from a myasthenia standpoint and is not experiencing her typical MG symptoms.  Recommendations  - cont IV solumedrol 1000mg  daily x 5 doses. Will need a prolonged PO taper afterwards. - ANA IFA, serum Myomarker 3 plus panel is pending. - ENA neg. - MRI left femur with and without contrast demonstrates muscle edema and inflammation - Gen surg team planning muscle biopsy on Monday. - Cardiac Monitoring. - Every 12 hours hours NIFs and VC starting tonight. - Recommend elective intubation for  respiratory compromise if VC falls below 15 to 20 mL/kg and or NIFs falls below -20cm/H2O. If poor effort and not sure, can always get ABG to assess for CO2 retention. Elective intubation for airway cmopromise if she has difficulty clearing her secretions. Oxygen saturation should not be used to make decision regarding intubation. - Recommend Mestinon PO 30mg  TID PRN. If unable to swallow, can switch from PO to IV.(30mg  PO is equivalent to 1mg  IV). She rarely takes it at home. - continue Cellcept 1000mg  BID. - recommend monitoring on progressive floor for the next 2 days. Steroids can temporarily worsen Myasthenia gravis. - Medications that may worsen or trigger MG exacerbation: Class IA antiarrhythmics, magnesium, flouroquinolones, macrolides, aminoglycosides, penicillamine, curare, interferon alpha, botox, quinine. Use with caution: calcium channel blocker, beta blockers and statins. - already established with Neuromuscular clinic at [facility]. Follow up with [facility] at  discharge. ______________________________________________________________________   Thank you for the opportunity to take part in the care of this patient. If you have any further questions, please contact the neurology consultation attending.  Signed,  Erick Blinks [facility] Pager Number [phone]

## 2022-07-15 NOTE — Progress Notes (Signed)
Per pt, she states her breathing is feeling much better.  Currently [facility] clear, pt states she does not feel she needs breathing treatment, and requests/states that whe will call for treatment if she needs it.  RN in room and aware.

## 2022-07-15 NOTE — Progress Notes (Addendum)
Pt complained short of breath, VS as follows 172/97, HR 58, RR 13, 98 % on 2lpm [place]. Positioned comfortably in bed, encouraged deep breathing. Albuterol neb given by RT. After few minutes, pt verbalized still struggling during exhalation. Nebulization helped her but not much as per pt. Wheezing heard during exhalation. On call provider notified. Lasix and Morphine given as ordered. Will continue to monitor.  At 12:45 am, pt urine output post Lasix is 900 ml, yellow and clear. Pt verbalized she feels a lot better. O2 support discontinued. 98% on RA.

## 2022-07-15 NOTE — Progress Notes (Signed)
NIF -30 VC 1.8 

## 2022-07-15 NOTE — Progress Notes (Signed)
NIF -32 x 2 efforts VC 1.78L x 2 efforts

## 2022-07-15 NOTE — Progress Notes (Addendum)
PROGRESS NOTE    Teresa Lee  ZOX:[id number] DOB: [birth year] DOA: 07/10/2022 PCP: [name], MD   Brief Narrative: 62 year old F with PMH of OA, HTN, HLD on statin, MG who presented to [facility] ED with fatigue and generalized weakness for about 2 weeks and admitted to [facility] with nontraumatic rhabdomyolysis, AKI and elevated liver enzymes.  CK elevated to 8700.  Started on IV fluid.  Neurology consulted and recommended transfer to [facility] and started on high-dose steroid through 07/17/2022.  Patient had left hip pain.  MRI of left femur showed muscle edema and inflammation.  General surgery consulted for biopsy.   07/15/2022 overnight patient received Lasix and morphine for increasing shortness of breath which did not improve with nebulizer treatments.  She was getting IV fluids which was stopped this morning.  White count is 16.1 from 13.8 potassium 3.2 magnesium 2.0  Assessment & Plan:   Principal Problem:   Rhabdomyolysis Active Problems:   ARF (acute renal failure) (HCC)   Non-insulin dependent type 2 diabetes mellitus (HCC)   Nontraumatic rhabdomyolysis: Has been physically deconditioned lately.  Seems she was on a statin that was discontinued indefinitely.  Has history of myasthenia gravis.   CPK 676 from 1056  Dc ivf  -Continue Solu-Medrol 1 g daily for 5 days per neurology   Myasthenia gravis: No signs of exacerbation.  On CellCept, prednisone, Mestinon at home. -Continue home CellCept, Mestinon -Hold prednisone while on Solu-Medrol -Continue NIF/VC per RT-neurology recommends elective intubation for signs of respiratory compromise   Left and right hip pain/osteoarthritis: Right hip x-ray without acute finding.  MRI of left femur with muscle edema and inflammation -Neurology recommended muscle biopsy.  General surgery consulted. -Expect steroid to help with pain. -Continue home Robaxin   Elevated LFTs: Likely due to rhabdo.  Improving. Continue monitoring   AKI:  Cr trended up despite improvement in rhabdo.  Creatinine 2.67 from 2.64 yesterday  New NIDDM-2 with hyperglycemia: A1c 8.1%.  Hyperglycemia likely due to steroid. CBG (last 3)  Recent Labs    07/14/22 2138 07/15/22 0628 07/15/22 1126  GLUCAP 326* 305* 392*   Increase Semglee to 26 units on 07/15/2022 appreciate diabetic coordinator input. -Block 6 units 3 times daily before meals  Leukocytosis: Likely from steroid. Will monitor closely for any infection  Hypokalemia/hypomagnesemia -Monitor replenish as appropriate   Elevated TSH: TSH 10.4.  Free T4 is 0.66. -Recheck outpatient   Acute metabolic acidosis: Likely due to IV fluid. -Continue monitoring   HTN: BP slightly elevated. -Continue Toprol-XL -Schedule low-dose hydralazine -Continue holding losartan.   Obesity Body mass index is 31.39 kg/m. -Counseled on lifestyle change   DVT prophylaxis:  heparin injection 5,000 Units Start: 07/10/22 2200   Code Status: Full code Family Communication: None at bedside Level of care: Progressive Status is: Inpatient Remains inpatient appropriate because: Nontraumatic rhabdomyolysis requiring IV fluid and high-dose steroid     Final disposition: TBD Consultants:  Neurology General surgery  Estimated body mass index is 31.39 kg/m as calculated from the following:   Height as of this encounter: 5' (1.524 m).   Weight as of this encounter: 72.9 kg.   Subjective:  She is sitting up by the side of the bed and trying to eat breakfast.  She had a rough night where she was more short of breath.  She was given nebulizer treatments without relief.  Then she was given Lasix and morphine which helped with her breathing.  Objective: Vitals:   07/14/22 2335  07/15/22 0326 07/15/22 0854 07/15/22 1336  BP: (!) 161/81 139/64 119/70 134/69  Pulse: (!) 57 (!) 49 72 75  Resp: 13 14    Temp: (!) 97.5 F (36.4 C) (!) 97.5 F (36.4 C) 97.8 F (36.6 C)   TempSrc: Oral Oral Oral    SpO2: 97% 99% 98% 98%  Weight:      Height:        Intake/Output Summary (Last 24 hours) at 07/15/2022 1428 Last data filed at 07/15/2022 2130 Gross per 24 hour  Intake 6096.19 ml  Output 3125 ml  Net 2971.19 ml   Filed Weights   07/10/22 1210 07/10/22 1651  Weight: 70.3 kg 72.9 kg    Examination:  General exam: Appears in no acute distress Respiratory system: Clear to auscultation. Respiratory effort normal.  No wheezing Cardiovascular system: S1 & S2 heard, RRR. No JVD, murmurs, rubs, gallops or clicks. No pedal edema. Gastrointestinal system: Abdomen is nondistended, soft and nontender. No organomegaly or masses felt. Normal bowel sounds heard. Central nervous system: Alert and oriented.  Extremities: 1+ bilateral pitting edema.   Data Reviewed: I have personally reviewed following labs and imaging studies  CBC: Recent Labs  Lab 07/10/22 1252 07/10/22 1755 07/11/22 0643 07/12/22 0545 07/13/22 0446 07/14/22 0708 07/15/22 0332  WBC 17.9*   < > 13.1* 11.2* 10.9* 13.8* 16.1*  NEUTROABS 13.5*  --   --   --  9.9* 12.7* 14.2*  HGB 11.8*   < > 9.7* 10.1* 9.6* 9.0* 8.9*  HCT 34.9*   < > 29.6* 30.4* 26.9* 27.2* 26.9*  MCV 93.6   < > 97.7 97.7 89.7 95.1 95.1  PLT 227   < > 179 181 166 192 181   < > = values in this interval not displayed.   Basic Metabolic Panel: Recent Labs  Lab 07/11/22 0643 07/12/22 0545 07/13/22 0446 07/14/22 0708 07/15/22 0332  NA 141 138 135 138 137  K 3.6 2.9* 4.0 4.0 3.2*  CL 112* 108 104 105 101  CO2 18* 19* 17* 18* 20*  GLUCOSE 130* 131* 325* 328* 323*  BUN 46* 40* 33* 38* 39*  CREATININE 2.61* 2.59* 2.90* 2.64* 2.67*  CALCIUM 8.2* 9.2 9.1 9.0 8.6*  MG  --  1.6*  --  1.5* 2.0  PHOS  --   --   --  4.4 4.3   GFR: Estimated Creatinine Clearance: 20 mL/min (A) (by C-G formula based on SCr of 2.67 mg/dL (H)). Liver Function Tests: Recent Labs  Lab 07/11/22 0643 07/12/22 0545 07/13/22 0446 07/14/22 0708 07/15/22 0332  AST 147*  153* 94* 47* 34  ALT 174* 193* 179* 151* 132*  ALKPHOS 80 143* 130* 98 89  BILITOT 0.7 0.9 0.9 0.7 0.6  PROT 5.3* 5.9* 5.5* 5.9* 6.0*  ALBUMIN 2.7* 2.8* 2.5* 2.8* 2.9*   No results for input(s): "LIPASE", "AMYLASE" in the last 168 hours. No results for input(s): "AMMONIA" in the last 168 hours. Coagulation Profile: No results for input(s): "INR", "PROTIME" in the last 168 hours. Cardiac Enzymes: Recent Labs  Lab 07/11/22 0643 07/12/22 0545 07/13/22 0446 07/14/22 0708 07/15/22 0332  CKTOTAL 5,803* 3,927* 2,648* 1,056* 676*   BNP (last 3 results) No results for input(s): "PROBNP" in the last 8760 hours. HbA1C: Recent Labs    07/13/22 0446  HGBA1C 8.1*   CBG: Recent Labs  Lab 07/14/22 1109 07/14/22 1611 07/14/22 2138 07/15/22 0628 07/15/22 1126  GLUCAP 239* 263* 326* 305* 392*   Lipid Profile: No results  for input(s): "CHOL", "HDL", "LDLCALC", "TRIG", "CHOLHDL", "LDLDIRECT" in the last 72 hours. Thyroid Function Tests: Recent Labs    07/13/22 0446  FREET4 0.66   Anemia Panel: No results for input(s): "VITAMINB12", "FOLATE", "FERRITIN", "TIBC", "IRON", "RETICCTPCT" in the last 72 hours. Sepsis Labs: No results for input(s): "PROCALCITON", "LATICACIDVEN" in the last 168 hours.  Recent Results (from the past 240 hour(s))  Resp panel by RT-PCR (RSV, Flu A&B, Covid) Urine, Clean Catch     Status: None   Collection Time: 07/10/22  1:39 PM   Specimen: Urine, Clean Catch; Nasal Swab  Result Value Ref Range Status   SARS Coronavirus 2 by RT PCR NEGATIVE NEGATIVE Final    Comment: (NOTE) SARS-CoV-2 target nucleic acids are NOT DETECTED.  The SARS-CoV-2 RNA is generally detectable in upper respiratory specimens during the acute phase of infection. The lowest concentration of SARS-CoV-2 viral copies this assay can detect is 138 copies/mL. A negative result does not preclude SARS-Cov-2 infection and should not be used as the sole basis for treatment or other patient  management decisions. A negative result may occur with  improper specimen collection/handling, submission of specimen other than nasopharyngeal swab, presence of viral mutation(s) within the areas targeted by this assay, and inadequate number of viral copies(<138 copies/mL). A negative result must be combined with clinical observations, patient history, and epidemiological information. The expected result is Negative.  Fact Sheet for Patients:  BloggerCourse.com  Fact Sheet for Healthcare Providers:  SeriousBroker.it  This test is no t yet approved or cleared by the [place] FDA and  has been authorized for detection and/or diagnosis of SARS-CoV-2 by FDA under an Emergency Use Authorization (EUA). This EUA will remain  in effect (meaning this test can be used) for the duration of the COVID-19 declaration under Section 564(b)(1) of the Act, 21 U.S.C.section 360bbb-3(b)(1), unless the authorization is terminated  or revoked sooner.       Influenza A by PCR NEGATIVE NEGATIVE Final   Influenza B by PCR NEGATIVE NEGATIVE Final    Comment: (NOTE) The Xpert Xpress SARS-CoV-2/FLU/RSV plus assay is intended as an aid in the diagnosis of influenza from Nasopharyngeal swab specimens and should not be used as a sole basis for treatment. Nasal washings and aspirates are unacceptable for Xpert Xpress SARS-CoV-2/FLU/RSV testing.  Fact Sheet for Patients: BloggerCourse.com  Fact Sheet for Healthcare Providers: SeriousBroker.it  This test is not yet approved or cleared by the [place] FDA and has been authorized for detection and/or diagnosis of SARS-CoV-2 by FDA under an Emergency Use Authorization (EUA). This EUA will remain in effect (meaning this test can be used) for the duration of the COVID-19 declaration under Section 564(b)(1) of the Act, 21 U.S.C. section 360bbb-3(b)(1),  unless the authorization is terminated or revoked.     Resp Syncytial Virus by PCR NEGATIVE NEGATIVE Final    Comment: (NOTE) Fact Sheet for Patients: BloggerCourse.com  Fact Sheet for Healthcare Providers: SeriousBroker.it  This test is not yet approved or cleared by the [place] FDA and has been authorized for detection and/or diagnosis of SARS-CoV-2 by FDA under an Emergency Use Authorization (EUA). This EUA will remain in effect (meaning this test can be used) for the duration of the COVID-19 declaration under Section 564(b)(1) of the Act, 21 U.S.C. section 360bbb-3(b)(1), unless the authorization is terminated or revoked.  Performed at [facility], [address] [place], [place] [address]          Radiology Studies:  No results found.      Scheduled Meds:  [START ON 07/24/2022] cyanocobalamin  1,000 mcg Intramuscular Q2 months   desmopressin  0.6 mg Oral QHS   estradiol  2 mg Oral QHS   heparin  5,000 Units Subcutaneous Q8H   hydrALAZINE  50 mg Oral Q8H   insulin aspart  0-15 Units Subcutaneous TID AC & HS   insulin aspart  6 Units Subcutaneous TID WC   [START ON 07/16/2022] insulin glargine-yfgn  26 Units Subcutaneous QHS   methocarbamol  750 mg Oral QHS   metoprolol succinate  25 mg Oral QHS   mycophenolate  1,000 mg Oral BID   pantoprazole  40 mg Oral Daily   pregabalin  75 mg Oral QHS   progesterone  100 mg Oral QHS   rOPINIRole  0.5 mg Oral QHS   Continuous Infusions:  lactated ringers Stopped (07/15/22 0735)   methylPREDNISolone (SOLU-MEDROL) injection 1,000 mg (07/14/22 1602)     LOS: 5 days    Time spent: 38 min Alwyn Ren, MD 07/15/2022, 2:28 PM

## 2022-07-15 NOTE — Inpatient Diabetes Management (Signed)
Inpatient Diabetes Program Recommendations  AACE/ADA: New Consensus Statement on Inpatient Glycemic Control   Target Ranges:  Prepandial:   less than 140 mg/dL      Peak postprandial:   less than 180 mg/dL (1-2 hours)      Critically ill patients:  140 - 180 mg/dL    Latest Reference Range & Units 07/14/22 06:15 07/14/22 11:09 07/14/22 16:11 07/14/22 21:38 07/15/22 06:28  Glucose-Capillary 70 - 99 mg/dL 981 (H) 191 (H) 478 (H) 326 (H) 305 (H)   Review of Glycemic Control  Diabetes history: No; new DM dx this admission Outpatient Diabetes medications: NA Current orders for Inpatient glycemic control: Semglee 15 units daily, Novolog 0-15 units AC&HS; Soloumedrol 1000 mg Q24H  Inpatient Diabetes Program Recommendations:    Insulin: Fasting CBG 305 mg/dl today.  If steroids are continued as ordered, please consider increasing Semglee to 25 units daily (also order one time Semglee 10 units x1 now since already given 15 units today) and ordering Novolog 6 units TID with meals for meal coverage if patient eats at least 50% of meals.   NOTE: Noted consult for Diabetes Coordinator. Diabetes Coordinator is not on campus over the weekend but available by pager from 8am to 5pm for questions or concerns.   Thanks, Orlando Penner, RN, MSN, CDCES Diabetes Coordinator Inpatient Diabetes Program [phone] (Team Pager from 8am to 5pm)

## 2022-07-16 DIAGNOSIS — E119 Type 2 diabetes mellitus without complications: Secondary | ICD-10-CM | POA: Diagnosis not present

## 2022-07-16 DIAGNOSIS — M6282 Rhabdomyolysis: Secondary | ICD-10-CM | POA: Diagnosis not present

## 2022-07-16 DIAGNOSIS — N179 Acute kidney failure, unspecified: Secondary | ICD-10-CM | POA: Diagnosis not present

## 2022-07-16 DIAGNOSIS — G7000 Myasthenia gravis without (acute) exacerbation: Secondary | ICD-10-CM | POA: Diagnosis not present

## 2022-07-16 LAB — GLUCOSE, CAPILLARY
Glucose-Capillary: 276 mg/dL — ABNORMAL HIGH (ref 70–99)
Glucose-Capillary: 288 mg/dL — ABNORMAL HIGH (ref 70–99)
Glucose-Capillary: 111 mg/dL — ABNORMAL HIGH (ref 70–99)
Glucose-Capillary: 228 mg/dL — ABNORMAL HIGH (ref 70–99)

## 2022-07-16 LAB — COMPREHENSIVE METABOLIC PANEL
ALT: 110 U/L — ABNORMAL HIGH (ref 0–44)
AST: 40 U/L (ref 15–41)
Albumin: 2.7 g/dL — ABNORMAL LOW (ref 3.5–5.0)
Alkaline Phosphatase: 78 U/L (ref 38–126)
Anion gap: 11 (ref 5–15)
BUN: 47 mg/dL — ABNORMAL HIGH (ref 6–20)
CO2: 22 mmol/L (ref 22–32)
Calcium: 8 mg/dL — ABNORMAL LOW (ref 8.9–10.3)
Chloride: 106 mmol/L (ref 98–111)
Creatinine, Ser: 2.48 mg/dL — ABNORMAL HIGH (ref 0.44–1.00)
GFR, Estimated: 22 mL/min — ABNORMAL LOW (ref >60)
Glucose, Bld: 324 mg/dL — ABNORMAL HIGH (ref 70–99)
Potassium: 3.9 mmol/L (ref 3.5–5.1)
Sodium: 139 mmol/L (ref 135–145)
Total Bilirubin: 0.6 mg/dL (ref 0.3–1.2)
Total Protein: 5.3 g/dL — ABNORMAL LOW (ref 6.5–8.1)

## 2022-07-16 LAB — CBC WITH DIFFERENTIAL/PLATELET
Abs Immature Granulocytes: 1.1 10*3/uL — ABNORMAL HIGH (ref 0.00–0.07)
Basophils Absolute: 0.1 10*3/uL (ref 0.0–0.1)
Basophils Relative: 1 %
Eosinophils Absolute: 0 10*3/uL (ref 0.0–0.5)
Eosinophils Relative: 0 %
HCT: 24.9 % — ABNORMAL LOW (ref 36.0–46.0)
Hemoglobin: 8.5 g/dL — ABNORMAL LOW (ref 12.0–15.0)
Immature Granulocytes: 9 %
Lymphocytes Relative: 4 %
Lymphs Abs: 0.4 10*3/uL — ABNORMAL LOW (ref 0.7–4.0)
MCH: 32.1 pg (ref 26.0–34.0)
MCHC: 34.1 g/dL (ref 30.0–36.0)
MCV: 94 fL (ref 80.0–100.0)
Monocytes Absolute: 0.4 10*3/uL (ref 0.1–1.0)
Monocytes Relative: 4 %
Neutro Abs: 10.2 10*3/uL — ABNORMAL HIGH (ref 1.7–7.7)
Neutrophils Relative %: 82 %
Platelets: 176 10*3/uL (ref 150–400)
RBC: 2.65 MIL/uL — ABNORMAL LOW (ref 3.87–5.11)
RDW: 13.4 % (ref 11.5–15.5)
WBC: 12.2 10*3/uL — ABNORMAL HIGH (ref 4.0–10.5)
nRBC: 0.2 % (ref 0.0–0.2)

## 2022-07-16 LAB — MISC LABCORP TEST (SEND OUT)

## 2022-07-16 MED ORDER — INSULIN GLARGINE-YFGN 100 UNIT/ML ~~LOC~~ SOLN
26.0000 [IU] | Freq: Every day | SUBCUTANEOUS | Status: DC
  Administered 2022-07-16 – 2022-07-18 (×3): 26 [IU] via SUBCUTANEOUS
  Filled 2022-07-16 (×5): qty 0.26

## 2022-07-16 MED ORDER — INSULIN ASPART 100 UNIT/ML IJ SOLN
10.0000 [IU] | Freq: Three times a day (TID) | INTRAMUSCULAR | Status: DC
  Administered 2022-07-16 (×2): 10 [IU] via SUBCUTANEOUS

## 2022-07-16 MED ORDER — PREDNISONE 50 MG PO TABS
50.0000 mg | ORAL_TABLET | Freq: Every day | ORAL | Status: DC
  Administered 2022-07-17 – 2022-07-19 (×3): 50 mg via ORAL
  Filled 2022-07-16 (×3): qty 1

## 2022-07-16 MED ORDER — INSULIN GLARGINE-YFGN 100 UNIT/ML ~~LOC~~ SOLN
10.0000 [IU] | Freq: Once | SUBCUTANEOUS | Status: AC
  Administered 2022-07-16: 10 [IU] via SUBCUTANEOUS
  Filled 2022-07-16: qty 0.1

## 2022-07-16 MED ORDER — INSULIN GLARGINE-YFGN 100 UNIT/ML ~~LOC~~ SOLN: 30.0000 [IU] | Freq: Every day | SUBCUTANEOUS | Status: DC

## 2022-07-16 NOTE — Progress Notes (Signed)
[facility]                                                                               Teresa Lee, is a 62 y.o. female, DOB - August 19, 1960, WUJ:[id number] Admit date - 07/10/2022    Outpatient Primary MD for the patient is [name], MD  LOS - 6  days    Brief summary   62 year old F with PMH of OA, HTN, HLD on statin, MG who presented to [facility] ED with fatigue and generalized weakness for about 2 weeks and admitted to [facility] with nontraumatic rhabdomyolysis, AKI and elevated liver enzymes.  CK elevated to 8700.  Started on IV fluid.  Neurology consulted and recommended transfer to [facility] and started on high-dose steroid through 07/17/2022.  Patient had left hip pain.  MRI of left femur showed muscle edema and inflammation.  General surgery consulted for biopsy.    07/15/2022 overnight patient received Lasix and morphine for increasing shortness of breath which did not improve with nebulizer treatments.  She was getting IV fluids which was stopped this morning.  White count is 16.1 from 13.8 potassium 3.2 magnesium 2.0     Assessment & Plan    Assessment and Plan:  Non traumatic Rhabdomyolysis In the setting of MG Improving CPK. Recheck in am.  MRI of the left leg demonstrates muscle edema and inflammation.  On high dose steroids  last dose today.  It will be followed by prednisone 50 mg starting from 07/17/22. Will probably need prolonged steroid taper.  Neurology on board.    Myasthenia gravis.  No signs of exacerbation.  Meanwhile resume pyridostigmine and cellcept.  Continue NIF/VC per neurology recommendations.  She is on RA. No signs of respiratory compromise.    Elevated liver enzymes:  Improving , due to Rhabdomyolysis.    AKI;  Slowly improving.  Creatinine down to 2.4  Baseline creatinine is ranging from 1.3 to 1.6. admitted with a creatinine of 2.76. Suspect from rhabdomyolysis. Urine output adequate.  CT abd and pelvis on admission  ruled out hydronephrosis.    Left and right hip pain / Osteoarthritis:  MRI of the hip reviewed.  She is scheduled for muscle biopsy on Monday.      Uncontrolled diabetes mellitus with hyperglycemia due to high dose steroids.  Increased Semglee, to 26 units at bedtime, added another 10 units of semglee this morning.  Increased novolog to 10 units TIDAC.  CBG (last 3)  Recent Labs    07/16/22 0617 07/16/22 1105 07/16/22 1654  GLUCAP 276* 288* 111*  Continue with SSI.  Hopefully  her cbg's will start improving in am.    Hypertension:  Well controlled.  Resume metoprolol, Hydralazine.   Body mass index is 31.39 kg/m. Recommend outpatient follow up with PCP for weight loss .   Elevated TSH Normal free t4.  Recommend checking thyroid panel once acute illness is resolved.    Hypokalemia and hypomagnesemia:  Replaced.   Acute metabolic acidosis.  Appears to have resolved.    Normocytic anemia.  Slow drop in hemoglobin suspect from acute ill ness.  Continue to monitor and transfuse to keep it greater than 7.  Leukocytosis:  Probably from inflammation and steroids,  Improving.  Continue to monitor.    Estimated body mass index is 31.39 kg/m as calculated from the following:   Height as of this encounter: 5' (1.524 m).   Weight as of this encounter: 72.9 kg.  Code Status: full code.  DVT Prophylaxis:  heparin injection 5,000 Units Start: 07/10/22 2200   Level of Care: Level of care: Progressive Family Communication: family at bedside.   Disposition Plan:     Remains inpatient appropriate:  IV steroids.   Procedures: MRI of femur.    Consultants:   Neurology.   Antimicrobials:   Anti-infectives (From admission, onward)    None        Medications  Scheduled Meds:  [START ON 07/24/2022] cyanocobalamin  1,000 mcg Intramuscular Q2 months   desmopressin  0.6 mg Oral QHS   estradiol  2 mg Oral QHS   heparin  5,000 Units Subcutaneous Q8H    hydrALAZINE  50 mg Oral Q8H   insulin aspart  0-15 Units Subcutaneous TID AC & HS   insulin aspart  10 Units Subcutaneous TID WC   insulin glargine-yfgn  26 Units Subcutaneous QHS   methocarbamol  750 mg Oral QHS   metoprolol succinate  25 mg Oral QHS   mycophenolate  1,000 mg Oral BID   pantoprazole  40 mg Oral Daily   [START ON 07/17/2022] predniSONE  50 mg Oral Q breakfast   pregabalin  75 mg Oral QHS   progesterone  100 mg Oral QHS   rOPINIRole  0.5 mg Oral QHS   Continuous Infusions:  lactated ringers Stopped (07/15/22 0729)   PRN Meds:.acetaminophen, albuterol, butalbital-acetaminophen-caffeine, diphenhydrAMINE, hydrALAZINE, hydrALAZINE, HYDROcodone-acetaminophen, HYDROmorphone (DILAUDID) injection, morphine injection, ondansetron (ZOFRAN) IV, ondansetron, pyridostigmine    Subjective:   Teresa Lee was seen and examined today.  No new complaints today. She Is aware of the plan.  She is concerned about elevated cbg's.   Objective:   Vitals:   07/16/22 1103 07/16/22 1247 07/16/22 1524 07/16/22 1934  BP: (!) 169/80 (!) 158/74 (!) 161/73 138/71  Pulse: (!) 51 (!) 59 (!) 51 (!) 58  Resp: 18 18 18 18   Temp: 98 F (36.7 C) 98.1 F (36.7 C) 98 F (36.7 C) 98 F (36.7 C)  TempSrc: Oral Oral Oral Oral  SpO2: 97%  97% 96%  Weight:      Height:        Intake/Output Summary (Last 24 hours) at 07/16/2022 1943 Last data filed at 07/16/2022 1736 Gross per 24 hour  Intake 833.13 ml  Output 1650 ml  Net -816.87 ml   Filed Weights   07/10/22 1210 07/10/22 1651  Weight: 70.3 kg 72.9 kg     Exam General: Alert and oriented x 3, NAD Cardiovascular: S1 S2 auscultated, no murmurs, bradycardia.  Respiratory: Clear to auscultation bilaterally, no wheezing, rales or rhonchi Gastrointestinal: Soft, nontender, nondistended, + bowel sounds Ext: no pedal edema bilaterally Neuro: AAOx3,  Skin: No rashes Psych: Normal affect and demeanor, alert and oriented x3    Data Reviewed:   I have personally reviewed following labs and imaging studies   CBC Lab Results  Component Value Date   WBC 12.2 (H) 07/16/2022   RBC 2.65 (L) 07/16/2022   HGB 8.5 (L) 07/16/2022   HCT 24.9 (L) 07/16/2022   MCV 94.0 07/16/2022   MCH 32.1 07/16/2022   PLT 176 07/16/2022   MCHC 34.1 07/16/2022   RDW 13.4 07/16/2022   LYMPHSABS 0.4 (  L) 07/16/2022   MONOABS 0.4 07/16/2022   EOSABS 0.0 07/16/2022   BASOSABS 0.1 07/16/2022     Last metabolic panel Lab Results  Component Value Date   NA 139 07/16/2022   K 3.9 07/16/2022   CL 106 07/16/2022   CO2 22 07/16/2022   BUN 47 (H) 07/16/2022   CREATININE 2.48 (H) 07/16/2022   GLUCOSE 324 (H) 07/16/2022   GFRNONAA 22 (L) 07/16/2022   GFRAA >60 02/25/2019   CALCIUM 8.0 (L) 07/16/2022   PHOS 4.3 07/15/2022   PROT 5.3 (L) 07/16/2022   ALBUMIN 2.7 (L) 07/16/2022   BILITOT 0.6 07/16/2022   ALKPHOS 78 07/16/2022   AST 40 07/16/2022   ALT 110 (H) 07/16/2022   ANIONGAP 11 07/16/2022    CBG (last 3)  Recent Labs    07/16/22 0617 07/16/22 1105 07/16/22 1654  GLUCAP 276* 288* 111*      Coagulation Profile: No results for input(s): "INR", "PROTIME" in the last 168 hours.   Radiology Studies: No results found.     Kathlen Mody M.D. [facility] 07/16/2022, 7:43 PM  Available via Epic secure chat 7am-7pm After 7 pm, please refer to night coverage provider listed on amion.

## 2022-07-16 NOTE — Inpatient Diabetes Management (Signed)
Inpatient Diabetes Program Recommendations  AACE/ADA: New Consensus Statement on Inpatient Glycemic Control   Target Ranges:  Prepandial:   less than 140 mg/dL      Peak postprandial:   less than 180 mg/dL (1-2 hours)      Critically ill patients:  140 - 180 mg/dL    Latest Reference Range & Units 07/15/22 06:28 07/15/22 11:26 07/15/22 15:58 07/15/22 21:04 07/16/22 06:17  Glucose-Capillary 70 - 99 mg/dL 161 (H) 096 (H) 045 (H) 296 (H) 276 (H)   Review of Glycemic Control  Diabetes history: No; new DM dx this admission Outpatient Diabetes medications: NA Current orders for Inpatient glycemic control: Semglee 26 units QHS, Novolog 0-15 units AC&HS, Novolog 6 units TID with meals; Soloumedrol 1000 mg Q24H   Inpatient Diabetes Program Recommendations:    Insulin: If steroids continued as ordered, please consider increasing Semglee to 30 units and change frequency to daily (to start at 10 am today) and increase meal coverage to Novolog 10 units TID with meals.  Thanks, Orlando Penner, RN, MSN, CDCES Diabetes Coordinator Inpatient Diabetes Program [phone] (Team Pager from 8am to 5pm)

## 2022-07-16 NOTE — Progress Notes (Signed)
Mobility Specialist: Progress Note   07/16/22 1053  Mobility  Activity Ambulated with assistance in hallway  Level of Assistance Contact guard assist, steadying assist  Assistive Device Front wheel walker  Distance Ambulated (ft) 230 ft  Activity Response Tolerated well  Mobility Referral Yes  $Mobility charge 1 Mobility   Post-Mobility: 57 HR  Pt received in the bed and agreeable to mobility. Mod I with bed mobility and contact guard during ambulation. Stopped x2 for standing breaks secondary to fatigue and SOB. Pt back to bed after session with call bell and phone in reach.   Jamont Mellin Mobility Specialist Please contact via SecureChat or Rehab office at [phone]

## 2022-07-16 NOTE — Progress Notes (Signed)
NEUROLOGY CONSULTATION PROGRESS NOTE   Date of service: July 16, 2022 Patient Name: Teresa Lee MRN:  [id number] DOB:  [birth year]  Brief HPI  Teresa Lee is a 62 y.o. female with PMH significant for myasthenia gravis on as needed Mestinon and CellCept 1000 twice daily, fibromyalgia, GERD, hypertension, hyperlipidemia who is currently admitted to the hospital with progressive generalized weakness and muscle aches over the last few months.  She was found to have rhabdomyolysis and AKI.  Overall, presentation is concerning for idiopathic inflammatory myositis. No skin lesions concerning for dermatomyositis. Could be statin induced myositis, polymyositis, inclusion body myositis, antisynthetase syndrome, myositis secondary to rheum disorder or overlap syndrome. No viral symptoms around the time of onset of these symptoms that patient can recall and thus not particularly concerning for viral myositis.   I think she is stable from a myasthenia standpoint and is not experiencing her typical MG symptoms.  Interval Hx   S/p 4 doses of solumedrol and tolerated fine. Legs dont shake as much when she walks with a walker. Muscles are less sore.  Vitals   Vitals:   07/15/22 1955 07/16/22 0026 07/16/22 0500 07/16/22 0806  BP: (!) 153/73 (!) 142/81 (!) 163/92 138/68  Pulse: (!) 58 65 65 70  Resp: 16 16 16 16   Temp: 98.2 F (36.8 C) 98.3 F (36.8 C) 97.7 F (36.5 C) 97.8 F (36.6 C)  TempSrc: Oral Oral Oral Oral  SpO2: 97% 96% 97% 98%  Weight:      Height:         Body mass index is 31.39 kg/m.  Physical Exam   General: Laying comfortably in bed; in no acute distress.  HENT: Normal oropharynx and mucosa. Normal external appearance of ears and nose.  Neck: Supple, no pain or tenderness  CV: No JVD. No peripheral edema.  Pulmonary: Symmetric Chest rise. Normal respiratory effort.  Abdomen: Soft to touch, non-tender.  Ext: No cyanosis, edema, or deformity  Skin: No rash.  Normal palpation of skin.   Musculoskeletal: Normal digits and nails by inspection. No clubbing.   Neurologic Examination  Mental status/Cognition: Alert, oriented to self, place, month and year, good attention.  Speech/language: Fluent, comprehension intact, object naming intact, repetition intact.  Cranial nerves:   CN II Pupils equal and reactive to light, no VF deficits    CN III,IV,VI EOM intact, no gaze preference or deviation, no nystagmus    CN V normal sensation in V1, V2, and V3 segments bilaterally    CN VII no asymmetry, no nasolabial fold flattening    CN VIII normal hearing to speech    CN IX & X normal palatal elevation, no uvular deviation    CN XI 5/5 head turn and 5/5 shoulder shrug bilaterally   CN XII midline tongue protrusion    Motor:  Muscle bulk: normal, tone normal, pronator drift none,  tremor none Mvmt Root Nerve  Muscle Right Left Comments  SA C5/6 Ax Deltoid 4+ 4+   EF C5/6 Mc Biceps 5 5   EE C6/7/8 Rad Triceps 5 5   WF C6/7 Med FCR     WE C7/8 PIN ECU     F Ab C8/T1 U ADM/FDI 5 5   HF L1/2/3 Fem Illopsoas 4+ 4+   KE L2/3/4 Fem Quad 5 5   DF L4/5 D Peron Tib Ant 5 5   PF S1/2 Tibial Grc/Sol 5 5     Sensation:  Light touch Intact throughout   Pin  prick    Temperature    Vibration   Proprioception    Coordination/Complex Motor:  - Finger to Nose intact BL - Heel to shin unable to do again due to pain today - Rapid alternating movement are slowed - Gait: deferred again for her safety.  Labs   Basic Metabolic Panel:  Lab Results  Component Value Date   NA 139 07/16/2022   K 3.9 07/16/2022   CO2 22 07/16/2022   GLUCOSE 324 (H) 07/16/2022   BUN 47 (H) 07/16/2022   CREATININE 2.48 (H) 07/16/2022   CALCIUM 8.0 (L) 07/16/2022   GFRNONAA 22 (L) 07/16/2022   GFRAA >60 02/25/2019   HbA1c:  Lab Results  Component Value Date   HGBA1C 8.1 (H) 07/13/2022   LDL:  Lab Results  Component Value Date   LDLCALC 162 (H) 08/08/2013   Urine  Drug Screen: No results found for: "LABOPIA", "COCAINSCRNUR", "LABBENZ", "AMPHETMU", "THCU", "LABBARB"  Alcohol Level No results found for: "ETH" No results found for: "PHENYTOIN", "ZONISAMIDE", "LAMOTRIGINE", "LEVETIRACETA" No results found for: "PHENYTOIN", "PHENOBARB", "VALPROATE", "CBMZ"  Imaging and Diagnostic studies   CT Head without contrast(Personally reviewed): 1.  No evidence of an acute intracranial abnormality. 2. Mild cerebral white matter disease, nonspecific but most often secondary to chronic small vessel ischemia. 3. Redemonstrated prominent perivascular space versus chronic lacunar infarct within the right external capsule/basal ganglia. 4. Unchanged from prior examinations, there is focal irregularity and dilation of the anterior body of the right lateral ventricle. Although not definitively identified, closed lip schizencephaly cannot be excluded. Consider a non-emergent brain MRI for further evaluation (if not already performed at an outside institution). 5. Absence of the septum pellucidum.  Lab Results  Component Value Date   CKTOTAL 676 (H) 07/15/2022   Lab Results  Component Value Date   NA 139 07/16/2022   K 3.9 07/16/2022   CO2 22 07/16/2022   GLUCOSE 324 (H) 07/16/2022   BUN 47 (H) 07/16/2022   CREATININE 2.48 (H) 07/16/2022   CALCIUM 8.0 (L) 07/16/2022   GFRNONAA 22 (L) 07/16/2022   MRI L femur w + w/o C:  NIF: -30 VC: 1.75  Impression   Teresa Lee is a 62 y.o. female with PMH significant for myasthenia gravis on as needed Mestinon and CellCept 1000 twice daily, fibromyalgia, GERD, hypertension, hyperlipidemia who is currently admitted to the hospital with progressive generalized weakness and muscle aches over the last few months.  She was found to have rhabdomyolysis and AKI.  Overall, presentation is concerning for idiopathic inflammatory myositis. No skin lesions concerning for dermatomyositis. Could be statin induced myositis,  polymyositis, inclusion body myositis, antisynthetase syndrome, myositis secondary to rheum disorder or overlap syndrome. No viral symptoms around the time of onset of these symptoms that patient can recall and thus not particularly concerning for viral myositis.   I think she is stable from a myasthenia standpoint and is not experiencing her typical MG symptoms.  Recommendations  - cont IV solumedrol 1000mg  daily x 5 doses. Will need a prolonged PO taper afterwards. Will start her on prednisone 50mg  daily starting Tuesday. - ANA IFA, serum Myomarker 3 plus panel is pending. - ENA neg. - MRI left femur with and without contrast demonstrates muscle edema and inflammation - Gen surg team planning muscle biopsy on Monday. - Cardiac Monitoring. - Daily hours hours NIFs and VC starting tonight. - Recommend elective intubation for respiratory compromise if VC falls below 15 to 20 mL/kg and or NIFs falls below -  20cm/H2O. If poor effort and not sure, can always get ABG to assess for CO2 retention. Elective intubation for airway cmopromise if she has difficulty clearing her secretions. Oxygen saturation should not be used to make decision regarding intubation. - Recommend Mestinon PO 30mg  TID PRN. If unable to swallow, can switch from PO to IV.(30mg  PO is equivalent to 1mg  IV). She rarely takes it at home. - continue Cellcept 1000mg  BID. - recommend monitoring on progressive floor for the next 2 days. Steroids can temporarily worsen Myasthenia gravis. - Medications that may worsen or trigger MG exacerbation: Class IA antiarrhythmics, magnesium, flouroquinolones, macrolides, aminoglycosides, penicillamine, curare, interferon alpha, botox, quinine. Use with caution: calcium channel blocker, beta blockers and statins. - already established with Neu[facility] at [facility]. Follow up with [facility] at discharge. ______________________________________________________________________   Thank you for the  opportunity to take part in the care of this patient. If you have any further questions, please contact the neurology consultation attending.  Signed,  Erick Blinks [facility] Pager Number [phone]

## 2022-07-17 ENCOUNTER — Ambulatory Visit: Payer: Self-pay | Admitting: General Surgery

## 2022-07-17 ENCOUNTER — Encounter (HOSPITAL_COMMUNITY)
Admission: EM | Disposition: A | Discharge status: Home Health | Payer: Self-pay | Source: Home / Self Care | Attending: Student

## 2022-07-17 ENCOUNTER — Inpatient Hospital Stay (HOSPITAL_COMMUNITY): Payer: Medicare HMO | Admitting: Certified Registered"

## 2022-07-17 ENCOUNTER — Encounter (HOSPITAL_COMMUNITY): Payer: Self-pay | Admitting: Internal Medicine

## 2022-07-17 ENCOUNTER — Other Ambulatory Visit: Payer: Self-pay

## 2022-07-17 DIAGNOSIS — N179 Acute kidney failure, unspecified: Secondary | ICD-10-CM | POA: Diagnosis not present

## 2022-07-17 DIAGNOSIS — Z9989 Dependence on other enabling machines and devices: Secondary | ICD-10-CM

## 2022-07-17 DIAGNOSIS — G4733 Obstructive sleep apnea (adult) (pediatric): Secondary | ICD-10-CM

## 2022-07-17 DIAGNOSIS — M609 Myositis, unspecified: Secondary | ICD-10-CM

## 2022-07-17 DIAGNOSIS — I10 Essential (primary) hypertension: Secondary | ICD-10-CM

## 2022-07-17 DIAGNOSIS — E119 Type 2 diabetes mellitus without complications: Secondary | ICD-10-CM

## 2022-07-17 DIAGNOSIS — M6282 Rhabdomyolysis: Secondary | ICD-10-CM | POA: Diagnosis not present

## 2022-07-17 DIAGNOSIS — J45909 Unspecified asthma, uncomplicated: Secondary | ICD-10-CM

## 2022-07-17 HISTORY — PX: MUSCLE BIOPSY: SHX716

## 2022-07-17 LAB — CBC WITH DIFFERENTIAL/PLATELET
Abs Immature Granulocytes: 0 10*3/uL (ref 0.00–0.07)
Basophils Absolute: 0 10*3/uL (ref 0.0–0.1)
Basophils Relative: 0 %
Eosinophils Absolute: 0 10*3/uL (ref 0.0–0.5)
Eosinophils Relative: 0 %
HCT: 26.3 % — ABNORMAL LOW (ref 36.0–46.0)
Hemoglobin: 8.4 g/dL — ABNORMAL LOW (ref 12.0–15.0)
Lymphocytes Relative: 1 %
Lymphs Abs: 0.1 10*3/uL — ABNORMAL LOW (ref 0.7–4.0)
MCH: 31.6 pg (ref 26.0–34.0)
MCHC: 31.9 g/dL (ref 30.0–36.0)
MCV: 98.9 fL (ref 80.0–100.0)
Monocytes Absolute: 0.2 10*3/uL (ref 0.1–1.0)
Monocytes Relative: 2 %
Neutro Abs: 11.7 10*3/uL — ABNORMAL HIGH (ref 1.7–7.7)
Neutrophils Relative %: 97 %
Platelets: 179 10*3/uL (ref 150–400)
RBC: 2.66 MIL/uL — ABNORMAL LOW (ref 3.87–5.11)
RDW: 13.4 % (ref 11.5–15.5)
WBC: 12.1 10*3/uL — ABNORMAL HIGH (ref 4.0–10.5)
nRBC: 0.3 % — ABNORMAL HIGH (ref 0.0–0.2)
nRBC: 2 /100 WBC — ABNORMAL HIGH

## 2022-07-17 LAB — COMPREHENSIVE METABOLIC PANEL
ALT: 102 U/L — ABNORMAL HIGH (ref 0–44)
AST: 29 U/L (ref 15–41)
Albumin: 2.7 g/dL — ABNORMAL LOW (ref 3.5–5.0)
Alkaline Phosphatase: 71 U/L (ref 38–126)
Anion gap: 12 (ref 5–15)
BUN: 42 mg/dL — ABNORMAL HIGH (ref 6–20)
CO2: 22 mmol/L (ref 22–32)
Calcium: 8.1 mg/dL — ABNORMAL LOW (ref 8.9–10.3)
Chloride: 106 mmol/L (ref 98–111)
Creatinine, Ser: 1.9 mg/dL — ABNORMAL HIGH (ref 0.44–1.00)
GFR, Estimated: 30 mL/min — ABNORMAL LOW (ref >60)
Glucose, Bld: 296 mg/dL — ABNORMAL HIGH (ref 70–99)
Potassium: 3.5 mmol/L (ref 3.5–5.1)
Sodium: 140 mmol/L (ref 135–145)
Total Bilirubin: 0.7 mg/dL (ref 0.3–1.2)
Total Protein: 5.3 g/dL — ABNORMAL LOW (ref 6.5–8.1)

## 2022-07-17 LAB — GLUCOSE, CAPILLARY
Glucose-Capillary: 274 mg/dL — ABNORMAL HIGH (ref 70–99)
Glucose-Capillary: 236 mg/dL — ABNORMAL HIGH (ref 70–99)
Glucose-Capillary: 248 mg/dL — ABNORMAL HIGH (ref 70–99)
Glucose-Capillary: 176 mg/dL — ABNORMAL HIGH (ref 70–99)
Glucose-Capillary: 193 mg/dL — ABNORMAL HIGH (ref 70–99)
Glucose-Capillary: 228 mg/dL — ABNORMAL HIGH (ref 70–99)
Glucose-Capillary: 330 mg/dL — ABNORMAL HIGH (ref 70–99)

## 2022-07-17 LAB — CK: Total CK: 280 U/L — ABNORMAL HIGH (ref 38–234)

## 2022-07-17 SURGERY — MUSCLE BIOPSY
Anesthesia: General | Site: Thigh | Laterality: Left

## 2022-07-17 MED ORDER — LOSARTAN POTASSIUM 50 MG PO TABS
50.0000 mg | ORAL_TABLET | Freq: Every day | ORAL | Status: DC
  Administered 2022-07-17 – 2022-07-19 (×3): 50 mg via ORAL
  Filled 2022-07-17 (×3): qty 1

## 2022-07-17 MED ORDER — ACETAMINOPHEN 500 MG PO TABS: 1000.0000 mg | ORAL_TABLET | Freq: Once | ORAL | Status: AC

## 2022-07-17 MED ORDER — ONDANSETRON HCL 4 MG/2ML IJ SOLN
INTRAMUSCULAR | Status: DC | PRN
  Administered 2022-07-17: 4 mg via INTRAVENOUS

## 2022-07-17 MED ORDER — VITAMIN D (ERGOCALCIFEROL) 1.25 MG (50000 UNIT) PO CAPS
50000.0000 [IU] | ORAL_CAPSULE | ORAL | Status: DC
  Administered 2022-07-17: 50000 [IU] via ORAL
  Filled 2022-07-17: qty 1

## 2022-07-17 MED ORDER — FENTANYL CITRATE (PF) 100 MCG/2ML IJ SOLN
INTRAMUSCULAR | Status: AC
  Filled 2022-07-17: qty 2

## 2022-07-17 MED ORDER — ORAL CARE MOUTH RINSE: 15.0000 mL | Freq: Once | OROMUCOSAL | Status: AC

## 2022-07-17 MED ORDER — LABETALOL HCL 5 MG/ML IV SOLN
INTRAVENOUS | Status: AC
  Filled 2022-07-17: qty 4

## 2022-07-17 MED ORDER — PROPOFOL 10 MG/ML IV BOLUS
INTRAVENOUS | Status: DC | PRN
  Administered 2022-07-17: 120 mg via INTRAVENOUS

## 2022-07-17 MED ORDER — INSULIN ASPART 100 UNIT/ML IJ SOLN: 8.0000 [IU] | Freq: Three times a day (TID) | INTRAMUSCULAR | Status: DC

## 2022-07-17 MED ORDER — CEFAZOLIN SODIUM-DEXTROSE 2-4 GM/100ML-% IV SOLN
2.0000 g | INTRAVENOUS | Status: AC
  Administered 2022-07-17: 2 g via INTRAVENOUS
  Filled 2022-07-17 (×2): qty 100

## 2022-07-17 MED ORDER — INSULIN ASPART 100 UNIT/ML IJ SOLN
0.0000 [IU] | Freq: Three times a day (TID) | INTRAMUSCULAR | Status: DC
  Administered 2022-07-17 (×2): 5 [IU] via SUBCUTANEOUS
  Administered 2022-07-18 (×2): 2 [IU] via SUBCUTANEOUS
  Administered 2022-07-18: 3 [IU] via SUBCUTANEOUS
  Administered 2022-07-19: 2 [IU] via SUBCUTANEOUS
  Filled 2022-07-17: qty 1

## 2022-07-17 MED ORDER — CHLORHEXIDINE GLUCONATE 0.12 % MT SOLN: 15.0000 mL | Freq: Once | OROMUCOSAL | Status: AC

## 2022-07-17 MED ORDER — 0.9 % SODIUM CHLORIDE (POUR BTL) OPTIME
TOPICAL | Status: DC | PRN
  Administered 2022-07-17: 1000 mL

## 2022-07-17 MED ORDER — MIDAZOLAM HCL 2 MG/2ML IJ SOLN
INTRAMUSCULAR | Status: AC
  Filled 2022-07-17: qty 2

## 2022-07-17 MED ORDER — FENTANYL CITRATE (PF) 100 MCG/2ML IJ SOLN: 25.0000 ug | INTRAMUSCULAR | Status: DC | PRN

## 2022-07-17 MED ORDER — INSULIN ASPART 100 UNIT/ML IJ SOLN
0.0000 [IU] | INTRAMUSCULAR | Status: DC | PRN
  Administered 2022-07-17: 2 [IU] via SUBCUTANEOUS

## 2022-07-17 MED ORDER — BUPIVACAINE-EPINEPHRINE (PF) 0.5% -1:200000 IJ SOLN
INTRAMUSCULAR | Status: AC
  Filled 2022-07-17: qty 30

## 2022-07-17 MED ORDER — PROPOFOL 10 MG/ML IV BOLUS
INTRAVENOUS | Status: AC
  Filled 2022-07-17: qty 20

## 2022-07-17 MED ORDER — LIDOCAINE 2% (20 MG/ML) 5 ML SYRINGE
INTRAMUSCULAR | Status: DC | PRN
  Administered 2022-07-17: 40 mg via INTRAVENOUS

## 2022-07-17 MED ORDER — LOPERAMIDE HCL 2 MG PO CAPS: 4.0000 mg | ORAL_CAPSULE | ORAL | Status: DC | PRN

## 2022-07-17 MED ORDER — FENTANYL CITRATE (PF) 250 MCG/5ML IJ SOLN
INTRAMUSCULAR | Status: AC
  Filled 2022-07-17: qty 5

## 2022-07-17 MED ORDER — INSULIN ASPART 100 UNIT/ML IJ SOLN: 0.0000 [IU] | Freq: Three times a day (TID) | INTRAMUSCULAR | Status: DC

## 2022-07-17 MED ORDER — BUPIVACAINE HCL (PF) 0.25 % IJ SOLN
INTRAMUSCULAR | Status: AC
  Filled 2022-07-17: qty 30

## 2022-07-17 MED ORDER — LACTATED RINGERS IV SOLN: INTRAVENOUS | Status: DC

## 2022-07-17 MED ORDER — INSULIN ASPART 100 UNIT/ML IJ SOLN
0.0000 [IU] | Freq: Every day | INTRAMUSCULAR | Status: DC
  Administered 2022-07-17: 4 [IU] via SUBCUTANEOUS

## 2022-07-17 MED ORDER — DEXMEDETOMIDINE HCL IN NACL 80 MCG/20ML IV SOLN
INTRAVENOUS | Status: AC
  Filled 2022-07-17: qty 20

## 2022-07-17 MED ORDER — HYDROCODONE-ACETAMINOPHEN 10-325 MG PO TABS
1.0000 | ORAL_TABLET | Freq: Every day | ORAL | Status: DC | PRN
  Administered 2022-07-17 – 2022-07-19 (×6): 1 via ORAL
  Filled 2022-07-17 (×6): qty 1

## 2022-07-17 MED ORDER — INSULIN ASPART 100 UNIT/ML IJ SOLN
8.0000 [IU] | Freq: Three times a day (TID) | INTRAMUSCULAR | Status: DC
  Administered 2022-07-18: 8 [IU] via SUBCUTANEOUS

## 2022-07-17 MED ORDER — EPHEDRINE SULFATE-NACL 50-0.9 MG/10ML-% IV SOSY
PREFILLED_SYRINGE | INTRAVENOUS | Status: DC | PRN
  Administered 2022-07-17: 5 mg via INTRAVENOUS
  Administered 2022-07-17: 10 mg via INTRAVENOUS

## 2022-07-17 MED ORDER — LABETALOL HCL 5 MG/ML IV SOLN
10.0000 mg | Freq: Once | INTRAVENOUS | Status: AC
  Administered 2022-07-17: 10 mg via INTRAVENOUS

## 2022-07-17 MED ORDER — DEXAMETHASONE SODIUM PHOSPHATE 10 MG/ML IJ SOLN
INTRAMUSCULAR | Status: DC | PRN
  Administered 2022-07-17: 10 mg via INTRAVENOUS

## 2022-07-17 MED ORDER — ONDANSETRON HCL 4 MG PO TABS: 4.0000 mg | ORAL_TABLET | Freq: Three times a day (TID) | ORAL | Status: DC | PRN

## 2022-07-17 MED ORDER — FENTANYL CITRATE (PF) 100 MCG/2ML IJ SOLN
INTRAMUSCULAR | Status: DC | PRN
  Administered 2022-07-17 (×2): 50 ug via INTRAVENOUS

## 2022-07-17 MED ORDER — LIDOCAINE HCL (PF) 1 % IJ SOLN
INTRAMUSCULAR | Status: AC
  Filled 2022-07-17: qty 30

## 2022-07-17 MED ORDER — INSULIN ASPART 100 UNIT/ML IJ SOLN: 6.0000 [IU] | Freq: Three times a day (TID) | INTRAMUSCULAR | Status: DC

## 2022-07-17 MED ORDER — BUPIVACAINE-EPINEPHRINE (PF) 0.5% -1:200000 IJ SOLN
INTRAMUSCULAR | Status: DC | PRN
  Administered 2022-07-17: 10 mL

## 2022-07-17 MED ORDER — MELOXICAM 7.5 MG PO TABS: 15.0000 mg | ORAL_TABLET | Freq: Every day | ORAL | Status: DC

## 2022-07-17 MED ORDER — HYDRALAZINE HCL 50 MG PO TABS
75.0000 mg | ORAL_TABLET | Freq: Three times a day (TID) | ORAL | Status: DC
  Administered 2022-07-17 – 2022-07-19 (×5): 75 mg via ORAL
  Filled 2022-07-17 (×5): qty 1

## 2022-07-17 MED ORDER — CHLORHEXIDINE GLUCONATE 0.12 % MT SOLN
OROMUCOSAL | Status: AC
  Filled 2022-07-17: qty 15

## 2022-07-17 MED ORDER — ACETAMINOPHEN 500 MG PO TABS
ORAL_TABLET | ORAL | Status: AC
  Administered 2022-07-17: 1000 mg via ORAL
  Filled 2022-07-17: qty 2

## 2022-07-17 MED ORDER — CHLORHEXIDINE GLUCONATE 0.12 % MT SOLN
OROMUCOSAL | Status: AC
  Administered 2022-07-17: 15 mL via OROMUCOSAL
  Filled 2022-07-17: qty 15

## 2022-07-17 SURGICAL SUPPLY — 33 items
APPLICATOR CHLORAPREP 10.5 ORG (MISCELLANEOUS) ×1 IMPLANT
BAG COUNTER SPONGE SURGICOUNT (BAG) ×1 IMPLANT
CNTNR URN SCR LID CUP LEK RST (MISCELLANEOUS) ×1 IMPLANT
CONT SPEC 4OZ STRL OR WHT (MISCELLANEOUS) ×1
COVER SURGICAL LIGHT HANDLE (MISCELLANEOUS) ×1 IMPLANT
DERMABOND ADVANCED .7 DNX12 (GAUZE/BANDAGES/DRESSINGS) ×1 IMPLANT
DRAPE LAPAROTOMY 100X72 PEDS (DRAPES) ×1 IMPLANT
DRSG TELFA 3X8 NADH STRL (GAUZE/BANDAGES/DRESSINGS) ×1 IMPLANT
ELECT REM PT RETURN 9FT ADLT (ELECTROSURGICAL) ×1
ELECTRODE REM PT RTRN 9FT ADLT (ELECTROSURGICAL) ×1 IMPLANT
GAUZE 4X4 16PLY ~~LOC~~+RFID DBL (SPONGE) ×1 IMPLANT
GLOVE BIOGEL PI IND STRL 8 (GLOVE) ×1 IMPLANT
GLOVE SURG SYN 7.5  E (GLOVE) ×1
GLOVE SURG SYN 7.5 E (GLOVE) ×1 IMPLANT
GLOVE SURG SYN 7.5 PF PI (GLOVE) IMPLANT
GOWN STRL REUS W/ TWL LRG LVL3 (GOWN DISPOSABLE) ×1 IMPLANT
GOWN STRL REUS W/ TWL XL LVL3 (GOWN DISPOSABLE) ×1 IMPLANT
GOWN STRL REUS W/TWL LRG LVL3 (GOWN DISPOSABLE) ×1
GOWN STRL REUS W/TWL XL LVL3 (GOWN DISPOSABLE) ×1
KIT BASIN OR (CUSTOM PROCEDURE TRAY) ×1 IMPLANT
KIT TURNOVER KIT B (KITS) ×1 IMPLANT
NDL HYPO 25GX1X1/2 BEV (NEEDLE) ×1 IMPLANT
NEEDLE HYPO 25GX1X1/2 BEV (NEEDLE) ×1 IMPLANT
NS IRRIG 1000ML POUR BTL (IV SOLUTION) ×1 IMPLANT
PACK GENERAL/GYN (CUSTOM PROCEDURE TRAY) ×1 IMPLANT
PAD ARMBOARD 7.5X6 YLW CONV (MISCELLANEOUS) ×1 IMPLANT
SUT MNCRL AB 4-0 PS2 18 (SUTURE) ×1 IMPLANT
SUT SILK 2 0 (SUTURE) ×1
SUT SILK 2-0 18XBRD TIE 12 (SUTURE) ×1 IMPLANT
SUT VIC AB 3-0 SH 27 (SUTURE) ×2
SUT VIC AB 3-0 SH 27X BRD (SUTURE) ×2 IMPLANT
SYR CONTROL 10ML LL (SYRINGE) ×1 IMPLANT
TOWEL GREEN STERILE (TOWEL DISPOSABLE) ×1 IMPLANT

## 2022-07-17 NOTE — Progress Notes (Signed)
PROGRESS NOTE  Teresa Lee ZOX:[id number] DOB: 10-06-1960   PCP: [name], MD  Patient is from: Home.  Reports using rolling walker at baseline.  Lately deconditioned  DOA: 07/10/2022 LOS: 7  Chief complaints Chief Complaint  Patient presents with   Weakness     Brief Narrative / Interim history: 62 year old F with PMH of OA, HTN, HLD on statin, MG who presented to [facility] ED with fatigue and generalized weakness for about 2 weeks and admitted to [facility] with nontraumatic rhabdomyolysis, AKI and elevated liver enzymes.  CK elevated to 8700.  Started on IV fluid.  Neurology consulted and recommended transfer to [facility] and started on high-dose steroid through 07/16/2022.  Patient had left hip pain.  MRI of left femur showed muscle edema and inflammation.  General surgery consulted  and plan for muscle biopsy on 4/29  Patient completed 5 days of high-dose Solu-Medrol (1 g/day) on 07/16/2022 and transition to p.o. prednisone with plan for slow taper per neurology.   Hospital course significant for new onset diabetes with A1c of 8.1%.     Subjective: Seen and examined earlier this morning.  No major events overnight of this morning.  No complaints but felt tired and short of breath ambulating with therapy earlier this morning.  She denies chest pain.   Objective: Vitals:   07/17/22 0609 07/17/22 0739 07/17/22 1156 07/17/22 1228  BP: (!) 150/79 (!) 164/83 (!) 186/81 (!) 191/85  Pulse: 69 (!) 52 (!) 51 (!) 49  Resp: 18 18 18 18   Temp: 98 F (36.7 C) 98.2 F (36.8 C) 98 F (36.7 C) 98 F (36.7 C)  TempSrc: Oral Oral Oral Oral  SpO2: 96% 97% 100% 94%  Weight:    71.2 kg  Height:    5' (1.524 m)    Examination:  GENERAL: No apparent distress.  Nontoxic. HEENT: MMM.  Vision and hearing grossly intact.  NECK: Supple.  No apparent JVD.  RESP:  No IWOB.  Fair aeration bilaterally. CVS:  RRR. Heart sounds normal.  ABD/GI/GU: BS+. Abd soft, NTND.  MSK/EXT:   No apparent  deformity. Moves extremities.  Trace BLE edema. SKIN: no apparent skin lesion or wound NEURO: Awake and alert. Oriented appropriately.  No apparent focal neuro deficit. PSYCH: Calm. Normal affect.   Procedures:  None  Microbiology summarized: COVID-19, influenza and RSV PCR nonreactive.  Assessment and plan: Principal Problem:   Rhabdomyolysis Active Problems:   ARF (acute renal failure) (HCC)   Non-insulin dependent type 2 diabetes mellitus (HCC)  Nontraumatic rhabdomyolysis: Has been physically deconditioned lately.  Seems she was on a statin that was discontinued indefinitely.  Has history of myasthenia gravis.  CK improving, down to 280.  CRP 0.1.  ESR 78. -Off IV fluid. -Completed 5 days of Solu-Medrol 1 g daily on 4/28 and started on p.o. prednisone with plan for slow taper -Continue monitoring  Myasthenia gravis: No signs of exacerbation.  On CellCept, prednisone, Mestinon at home. -Continue home CellCept, Mestinon -Prednisone as above. -Continue NIF/VC per RT-neurology recommends elective intubation for signs of respiratory compromise  Left and right hip pain/osteoarthritis: Right hip x-ray negative.  MRI of left femur with muscle edema & inflammation -Neurology recommended muscle biopsy.  General surgery consulted.  Plan for muscle biopsy on 4/29 -Continue home Robaxin  Elevated LFTs: Likely due to rhabdo.  Improving. Continue monitoring   AKI: Cr trended up despite improvement in rhabdo.  Improving. Recent Labs    09/18/21 0622 09/19/21 0800 07/10/22  1252 07/10/22 1755 07/11/22 1610 07/12/22 0545 07/13/22 0446 07/14/22 0708 07/15/22 0332 07/16/22 0257 07/17/22 0336  BUN 12 14 55*  --  46* 40* 33* 38* 39* 47* 42*  CREATININE 1.50* 1.33* 2.76* 2.65* 2.61* 2.59* 2.90* 2.64* 2.67* 2.48* 1.90*  -Avoid nephrotoxic meds -Avoid nephrotoxic meds  New NIDDM-2 with hyperglycemia and hyperlipidemia: A1c 8.1%.  Hyperglycemia likely due to steroid. Recent Labs  Lab  07/16/22 2110 07/17/22 0615 07/17/22 0741 07/17/22 0809 07/17/22 1151  GLUCAP 228* 274* 248* 236* 176*  -Continue Semglee 25 units at night. -Continue SSI-moderate -NovoLog 8 units 3 times daily with meals -Appreciate input by diabetic coordinator. -Check lipid panel.  Leukocytosis: Mild.  Likely from steroid.   Hypokalemia/hypomagnesemia -Monitor replenish as appropriate   Elevated TSH: TSH 10.4.  Free T4 is 0.66. -Recheck outpatient   Acute metabolic acidosis: Likely due to IV fluid. -Continue monitoring  HTN: BP remains elevated. -Continue Toprol-XL -Increase hydralazine from 50 to 75 mg 3 times daily -Continue holding losartan.   Obesity Body mass index is 30.66 kg/m. -Counseled on lifestyle change          DVT prophylaxis:  heparin injection 5,000 Units Start: 07/10/22 2200  Code Status: Full code Family Communication: None at bedside Level of care: Progressive Status is: Inpatient Remains inpatient appropriate because: Nontraumatic rhabdomyolysis   Final disposition: TBD Consultants:  Neurology General surgery  55 minutes with more than 50% spent in reviewing records, counseling patient/family and coordinating care.   Sch Meds:  Scheduled Meds:  chlorhexidine       [MAR Hold] cyanocobalamin  1,000 mcg Intramuscular Q2 months   [MAR Hold] desmopressin  0.6 mg Oral QHS   [MAR Hold] estradiol  2 mg Oral QHS   [MAR Hold] heparin  5,000 Units Subcutaneous Q8H   [MAR Hold] hydrALAZINE  50 mg Oral Q8H   [MAR Hold] insulin aspart  0-15 Units Subcutaneous TID WC   [MAR Hold] insulin aspart  0-5 Units Subcutaneous QHS   [MAR Hold] insulin aspart  8 Units Subcutaneous TID WC   [MAR Hold] insulin glargine-yfgn  26 Units Subcutaneous QHS   [MAR Hold] methocarbamol  750 mg Oral QHS   [MAR Hold] metoprolol succinate  25 mg Oral QHS   [MAR Hold] mycophenolate  1,000 mg Oral BID   [MAR Hold] pantoprazole  40 mg Oral Daily   [MAR Hold] predniSONE  50 mg  Oral Q breakfast   [MAR Hold] pregabalin  75 mg Oral QHS   [MAR Hold] progesterone  100 mg Oral QHS   [MAR Hold] rOPINIRole  0.5 mg Oral QHS   Continuous Infusions:  lactated ringers Stopped (07/15/22 0729)   lactated ringers 10 mL/hr at 07/17/22 1343   PRN Meds:.[MAR Hold] acetaminophen, [MAR Hold] albuterol, [MAR Hold] butalbital-acetaminophen-caffeine, chlorhexidine, [MAR Hold] diphenhydrAMINE, fentaNYL (SUBLIMAZE) injection, [MAR Hold] hydrALAZINE, [MAR Hold] hydrALAZINE, [MAR Hold] HYDROcodone-acetaminophen, [MAR Hold]  HYDROmorphone (DILAUDID) injection, insulin aspart, [MAR Hold]  morphine injection, [MAR Hold] ondansetron (ZOFRAN) IV, [MAR Hold] ondansetron, [MAR Hold] pyridostigmine  Antimicrobials: Anti-infectives (From admission, onward)    Start     Dose/Rate Route Frequency Ordered Stop   07/17/22 0845  [MAR Hold]  ceFAZolin (ANCEF) IVPB 2g/100 mL premix        (MAR Hold since Mon 07/17/2022 at 1223.Hold Reason: Transfer to a Procedural area)   2 g 200 mL/hr over 30 Minutes Intravenous On call to O.R. 07/17/22 0756 07/17/22 1425        I have personally reviewed  the following labs and images: CBC: Recent Labs  Lab 07/13/22 0446 07/14/22 0708 07/15/22 0332 07/16/22 0257 07/17/22 0336  WBC 10.9* 13.8* 16.1* 12.2* 12.1*  NEUTROABS 9.9* 12.7* 14.2* 10.2* 11.7*  HGB 9.6* 9.0* 8.9* 8.5* 8.4*  HCT 26.9* 27.2* 26.9* 24.9* 26.3*  MCV 89.7 95.1 95.1 94.0 98.9  PLT 166 192 181 176 179   BMP &GFR Recent Labs  Lab 07/12/22 0545 07/13/22 0446 07/14/22 0708 07/15/22 0332 07/16/22 0257 07/17/22 0336  NA 138 135 138 137 139 140  K 2.9* 4.0 4.0 3.2* 3.9 3.5  CL 108 104 105 101 106 106  CO2 19* 17* 18* 20* 22 22  GLUCOSE 131* 325* 328* 323* 324* 296*  BUN 40* 33* 38* 39* 47* 42*  CREATININE 2.59* 2.90* 2.64* 2.67* 2.48* 1.90*  CALCIUM 9.2 9.1 9.0 8.6* 8.0* 8.1*  MG 1.6*  --  1.5* 2.0  --   --   PHOS  --   --  4.4 4.3  --   --    Estimated Creatinine Clearance: 27.7  mL/min (A) (by C-G formula based on SCr of 1.9 mg/dL (H)). Liver & Pancreas: Recent Labs  Lab 07/13/22 0446 07/14/22 0708 07/15/22 0332 07/16/22 0257 07/17/22 0336  AST 94* 47* 34 40 29  ALT 179* 151* 132* 110* 102*  ALKPHOS 130* 98 89 78 71  BILITOT 0.9 0.7 0.6 0.6 0.7  PROT 5.5* 5.9* 6.0* 5.3* 5.3*  ALBUMIN 2.5* 2.8* 2.9* 2.7* 2.7*   No results for input(s): "LIPASE", "AMYLASE" in the last 168 hours. No results for input(s): "AMMONIA" in the last 168 hours. Diabetic: No results for input(s): "HGBA1C" in the last 72 hours.  Recent Labs  Lab 07/16/22 2110 07/17/22 0615 07/17/22 0741 07/17/22 0809 07/17/22 1151  GLUCAP 228* 274* 248* 236* 176*   Cardiac Enzymes: Recent Labs  Lab 07/12/22 0545 07/13/22 0446 07/14/22 0708 07/15/22 0332 07/17/22 0336  CKTOTAL 3,927* 2,648* 1,056* 676* 280*   No results for input(s): "PROBNP" in the last 8760 hours. Coagulation Profile: No results for input(s): "INR", "PROTIME" in the last 168 hours. Thyroid Function Tests: No results for input(s): "TSH", "T4TOTAL", "FREET4", "T3FREE", "THYROIDAB" in the last 72 hours.  Lipid Profile: No results for input(s): "CHOL", "HDL", "LDLCALC", "TRIG", "CHOLHDL", "LDLDIRECT" in the last 72 hours. Anemia Panel: No results for input(s): "VITAMINB12", "FOLATE", "FERRITIN", "TIBC", "IRON", "RETICCTPCT" in the last 72 hours. Urine analysis:    Component Value Date/Time   COLORURINE BROWN (A) 07/10/2022 1350   APPEARANCEUR CLOUDY (A) 07/10/2022 1350   LABSPEC 1.025 07/10/2022 1350   PHURINE 5.0 07/10/2022 1350   GLUCOSEU NEGATIVE 07/10/2022 1350   HGBUR LARGE (A) 07/10/2022 1350   BILIRUBINUR NEGATIVE 07/10/2022 1350   KETONESUR NEGATIVE 07/10/2022 1350   PROTEINUR >=300 (A) 07/10/2022 1350   NITRITE NEGATIVE 07/10/2022 1350   LEUKOCYTESUR NEGATIVE 07/10/2022 1350   Sepsis Labs: Invalid input(s): "PROCALCITONIN", "LACTICIDVEN"  Microbiology: Recent Results (from the past 240 hour(s))   Resp panel by RT-PCR (RSV, Flu A&B, Covid) Urine, Clean Catch     Status: None   Collection Time: 07/10/22  1:39 PM   Specimen: Urine, Clean Catch; Nasal Swab  Result Value Ref Range Status   SARS Coronavirus 2 by RT PCR NEGATIVE NEGATIVE Final    Comment: (NOTE) SARS-CoV-2 target nucleic acids are NOT DETECTED.  The SARS-CoV-2 RNA is generally detectable in upper respiratory specimens during the acute phase of infection. The lowest concentration of SARS-CoV-2 viral copies this assay can detect is  138 copies/mL. A negative result does not preclude SARS-Cov-2 infection and should not be used as the sole basis for treatment or other patient management decisions. A negative result may occur with  improper specimen collection/handling, submission of specimen other than nasopharyngeal swab, presence of viral mutation(s) within the areas targeted by this assay, and inadequate number of viral copies(<138 copies/mL). A negative result must be combined with clinical observations, patient history, and epidemiological information. The expected result is Negative.  Fact Sheet for Patients:  BloggerCourse.com  Fact Sheet for Healthcare Providers:  SeriousBroker.it  This test is no t yet approved or cleared by the [place] FDA and  has been authorized for detection and/or diagnosis of SARS-CoV-2 by FDA under an Emergency Use Authorization (EUA). This EUA will remain  in effect (meaning this test can be used) for the duration of the COVID-19 declaration under Section 564(b)(1) of the Act, 21 U.S.C.section 360bbb-3(b)(1), unless the authorization is terminated  or revoked sooner.       Influenza A by PCR NEGATIVE NEGATIVE Final   Influenza B by PCR NEGATIVE NEGATIVE Final    Comment: (NOTE) The Xpert Xpress SARS-CoV-2/FLU/RSV plus assay is intended as an aid in the diagnosis of influenza from Nasopharyngeal swab specimens and should  not be used as a sole basis for treatment. Nasal washings and aspirates are unacceptable for Xpert Xpress SARS-CoV-2/FLU/RSV testing.  Fact Sheet for Patients: BloggerCourse.com  Fact Sheet for Healthcare Providers: SeriousBroker.it  This test is not yet approved or cleared by the [place] FDA and has been authorized for detection and/or diagnosis of SARS-CoV-2 by FDA under an Emergency Use Authorization (EUA). This EUA will remain in effect (meaning this test can be used) for the duration of the COVID-19 declaration under Section 564(b)(1) of the Act, 21 U.S.C. section 360bbb-3(b)(1), unless the authorization is terminated or revoked.     Resp Syncytial Virus by PCR NEGATIVE NEGATIVE Final    Comment: (NOTE) Fact Sheet for Patients: BloggerCourse.com  Fact Sheet for Healthcare Providers: SeriousBroker.it  This test is not yet approved or cleared by the [place] FDA and has been authorized for detection and/or diagnosis of SARS-CoV-2 by FDA under an Emergency Use Authorization (EUA). This EUA will remain in effect (meaning this test can be used) for the duration of the COVID-19 declaration under Section 564(b)(1) of the Act, 21 U.S.C. section 360bbb-3(b)(1), unless the authorization is terminated or revoked.  Performed at [facility], [address] [place], [place] [address]     Radiology Studies: No results found.    Efe Fazzino T. Brinden Kincheloe [facility]  If 7PM-7AM, please contact night-coverage [email or web address] 07/17/2022, 2:31 PM

## 2022-07-17 NOTE — Anesthesia Preprocedure Evaluation (Addendum)
Anesthesia Evaluation  Patient identified by MRN, date of birth, ID band Patient awake    Reviewed: Allergy & Precautions, NPO status , Patient's Chart, lab work & pertinent test results, reviewed documented beta blocker date and time   Airway Mallampati: II  TM Distance: >3 FB Neck ROM: Full    Dental no notable dental hx. (+) Teeth Intact, Dental Advisory Given Upper and lower braces:   Pulmonary asthma , sleep apnea and Continuous Positive Airway Pressure Ventilation    Pulmonary exam normal breath sounds clear to auscultation       Cardiovascular hypertension, Pt. on home beta blockers and Pt. on medications Normal cardiovascular exam Rhythm:Regular Rate:Normal     Neuro/Psych  PSYCHIATRIC DISORDERS Anxiety     Myasthenia gravis, on mestinon as needed, last dose over a month ago, symptoms include eyelid droop, difficulty walking, difficulty swallowing  Neuromuscular disease    GI/Hepatic Neg liver ROS,GERD  ,,Crohn's   Endo/Other  diabetes, Type 2    Renal/GU Renal InsufficiencyRenal diseaseLab Results      Component                Value               Date                      CREATININE               1.90 (H)            07/17/2022                BUN                      42 (H)              07/17/2022                NA                       140                 07/17/2022                K                        3.5                 07/17/2022                CL                       106                 07/17/2022                CO2                      22                  07/17/2022             negative genitourinary   Musculoskeletal  (+) Arthritis ,  Fibromyalgia -S/p spinal cord stimulator, removal 2023   Abdominal   Peds  Hematology  (+) Blood dyscrasia, anemia Lab Results      Component  Value               Date                      WBC                      12.1 (H)            07/17/2022                 HGB                      8.4 (L)             07/17/2022                HCT                      26.3 (L)            07/17/2022                MCV                      98.9                07/17/2022                PLT                      179                 07/17/2022              Anesthesia Other Findings 62 year old F with PMH of OA, HTN, HLD on statin, MG on as needed Mestinon and CellCept 1000 twice daily who presented to ED with fatigue and generalized weakness for about 2 weeks and admitted to [facility] with nontraumatic rhabdomyolysis, AKI and elevated liver enzymes.  CK elevated to 8700.  Started on high-dose steroid through 07/17/2022. C/f idiopathic inflammatory myositis  Reproductive/Obstetrics                             Anesthesia Physical Anesthesia Plan  ASA: 3  Anesthesia Plan: General   Post-op Pain Management: Tylenol PO (pre-op)*   Induction: Intravenous  PONV Risk Score and Plan: 3 and Ondansetron, Midazolam and Dexamethasone  Airway Management Planned: LMA  Additional Equipment:   Intra-op Plan:   Post-operative Plan: Extubation in OR  Informed Consent: I have reviewed the patients History and Physical, chart, labs and discussed the procedure including the risks, benefits and alternatives for the proposed anesthesia with the patient or authorized representative who has indicated his/her understanding and acceptance.     Dental advisory given  Plan Discussed with: CRNA  Anesthesia Plan Comments:        Anesthesia Quick Evaluation

## 2022-07-17 NOTE — Progress Notes (Signed)
NIF -40, VC 1.3L with good patient effort.

## 2022-07-17 NOTE — Op Note (Signed)
07/17/2022  2:22 PM  PATIENT:  Teresa Lee  62 y.o. female  PRE-OPERATIVE DIAGNOSIS:  MYOSTITIS  POST-OPERATIVE DIAGNOSIS:  MYOSTITIS  PROCEDURE:  Procedure(s): LEFT THIGH MUSCLE BIOPSY (Left)  SURGEON:  Surgeon(s) and Role:    Axel Filler, MD - Primary  ANESTHESIA:   local and general  EBL:  0 mL   BLOOD ADMINISTERED:none  DRAINS: none   LOCAL MEDICATIONS USED:  BUPIVICAINE   SPECIMEN:  Source of Specimen:  left thigh muscle biopsy  DISPOSITION OF SPECIMEN:  PATHOLOGY  COUNTS:  YES  TOURNIQUET:  * No tourniquets in log *  DICTATION: .Dragon Dictation  The patient was taken to the OR and placed in the supine position with bilateral SCDs in place. The patient was prepped and draped in the usual sterile fashion. A time out was called and all facts were verified.   A 3cm incision was made over his R quadriceps muscle. Sharp dissection was taken down to his fascia. This was incised. A measrued muscle of 2.5x 2cm was Isolated with hemostats. This was sharply excised. Each cut end was tied off with 2-0 silk ties. The specimen was sent to pathology.   I checked for hemostasis and it was excellent. 3-0 vicryl were used to reapproximate the deep dermal layer. 4-0 monocryls were used to reapproximate the skin in a subcuticular fashion. Dermabond was used as a dressing.   The patient tolerated the procedure well. He was taken to the recovery room in stable condition.   PLAN OF CARE: Admit to inpatient   PATIENT DISPOSITION:  PACU - hemodynamically stable.   Delay start of Pharmacological VTE agent (>24hrs) due to surgical blood loss or risk of bleeding: not applicable

## 2022-07-17 NOTE — Anesthesia Procedure Notes (Signed)
Procedure Name: LMA Insertion Date/Time: 07/17/2022 1:55 PM  Performed by: Marny Lowenstein, CRNAPre-anesthesia Checklist: Patient identified, Emergency Drugs available, Suction available and Patient being monitored Patient Re-evaluated:Patient Re-evaluated prior to induction Oxygen Delivery Method: Circle system utilized Preoxygenation: Pre-oxygenation with 100% oxygen Induction Type: IV induction Ventilation: Mask ventilation without difficulty LMA: LMA inserted LMA Size: 3.0 Number of attempts: 1 Placement Confirmation: positive ETCO2 and breath sounds checked- equal and bilateral Tube secured with: Tape Dental Injury: Teeth and Oropharynx as per pre-operative assessment

## 2022-07-17 NOTE — Progress Notes (Signed)
Day of Surgery   Subjective/Chief Complaint: Pt doing well   Objective: Vital signs in last 24 hours: Temp:  [98 F (36.7 C)-98.4 F (36.9 C)] 98 F (36.7 C) (04/29 1228) Pulse Rate:  [49-69] 49 (04/29 1228) Resp:  [18] 18 (04/29 1228) BP: (138-191)/(71-88) 191/85 (04/29 1228) SpO2:  [94 %-100 %] 94 % (04/29 1228) Weight:  [71.2 kg] 71.2 kg (04/29 1228) Last BM Date : 07/13/22  Intake/Output from previous day: 04/28 0701 - 04/29 0700 In: -  Out: 1700 [Urine:1700] Intake/Output this shift: No intake/output data recorded.  PE:  Constitutional: No acute distress, conversant, appears states age. Eyes: Anicteric sclerae, moist conjunctiva, no lid lag Lungs: Clear to auscultation bilaterally, normal respiratory effort CV: regular rate and rhythm, no murmurs, no peripheral edema, pedal pulses 2+ GI: Soft, no masses or hepatosplenomegaly, non-tender to palpation Skin: No rashes, palpation reveals normal turgor Psychiatric: appropriate judgment and insight, oriented to person, place, and time   Lab Results:  Recent Labs    07/16/22 0257 07/17/22 0336  WBC 12.2* 12.1*  HGB 8.5* 8.4*  HCT 24.9* 26.3*  PLT 176 179   BMET Recent Labs    07/16/22 0257 07/17/22 0336  NA 139 140  K 3.9 3.5  CL 106 106  CO2 22 22  GLUCOSE 324* 296*  BUN 47* 42*  CREATININE 2.48* 1.90*  CALCIUM 8.0* 8.1*   PT/INR No results for input(s): "LABPROT", "INR" in the last 72 hours. ABG No results for input(s): "PHART", "HCO3" in the last 72 hours.  Invalid input(s): "PCO2", "PO2"  Studies/Results: No results found.  Anti-infectives: Anti-infectives (From admission, onward)    Start     Dose/Rate Route Frequency Ordered Stop   07/17/22 0845  [MAR Hold]  ceFAZolin (ANCEF) IVPB 2g/100 mL premix        (MAR Hold since Mon 07/17/2022 at 1223.Hold Reason: Transfer to a Procedural area)   2 g 200 mL/hr over 30 Minutes Intravenous On call to O.R. 07/17/22 0756 07/18/22 0559        Assessment/Plan: 61 F with myositis -To the OR for L thigh bx -I d/w her the risks and benefits of the proc to include but not limited to: infection, bleeding, damage to surrounding structures.  Pt voiced understanding and wishes to proceed.  LOS: 7 days    Axel Filler 07/17/2022

## 2022-07-17 NOTE — Transfer of Care (Signed)
Immediate Anesthesia Transfer of Care Note  Patient: Teresa Lee  Procedure(s) Performed: LEFT THIGH MUSCLE BIOPSY (Left: Thigh)  Patient Location: PACU  Anesthesia Type:General  Level of Consciousness: awake, alert , and oriented  Airway & Oxygen Therapy: Patient Spontanous Breathing  Post-op Assessment: Report given to RN and Post -op Vital signs reviewed and stable  Post vital signs: Reviewed and stable  Last Vitals:  Vitals Value Taken Time  BP 198/91 07/17/22 1430  Temp    Pulse 62 07/17/22 1432  Resp 13 07/17/22 1432  SpO2 94 % 07/17/22 1432  Vitals shown include unvalidated device data.  Last Pain:  Vitals:   07/17/22 1228  TempSrc: Oral  PainSc:       Patients Stated Pain Goal: 2 (07/16/22 1250)  Complications: No notable events documented.

## 2022-07-17 NOTE — Plan of Care (Signed)

## 2022-07-17 NOTE — Progress Notes (Signed)
Physical Therapy Treatment Patient Details Name: Teresa Lee MRN: [id number] DOB: Sep 09, 1960 Today's Date: 07/17/2022   History of Present Illness Patient is a 62 year old female who presented on 4/22 with weakness and fatigue.Patient was admitted with rhabdomyolysis. PMH: anxiety, osteoarthritis, asthma, crohn's disease, fibromyalgia, myasthenia gravis, PLIF back surgery, spinal cord stimulator insertion    PT Comments    Pt progressing towards her physical therapy goals and remains motivated to participate. Reports increased edema in bilateral feet and soreness. Pt ambulating 250 ft with a walker at a min guard assist level. Demonstrates proximal weakness and impaired standing balance. Provided stair training to simulate home entrance with good carryover by pt. Will benefit from follow up HHPT to address deficits and maximize functional independence.    Recommendations for follow up therapy are one component of a multi-disciplinary discharge planning process, led by the attending physician.  Recommendations may be updated based on patient status, additional functional criteria and insurance authorization.  Follow Up Recommendations       Assistance Recommended at Discharge Intermittent Supervision/Assistance  Patient can return home with the following A little help with walking and/or transfers;A little help with bathing/dressing/bathroom;Assistance with cooking/housework;Assist for transportation;Help with stairs or ramp for entrance   Equipment Recommendations  Rollator (4 wheels)    Recommendations for Other Services       Precautions / Restrictions Precautions Precautions: Fall Restrictions Weight Bearing Restrictions: No     Mobility  Bed Mobility               General bed mobility comments: Received sitting EOB    Transfers Overall transfer level: Modified independent Equipment used: Rolling walker (2 wheels)                     Ambulation/Gait Ambulation/Gait assistance: Min guard Gait Distance (Feet): 250 Feet Assistive device: Rolling walker (2 wheels) Gait Pattern/deviations: Step-through pattern, Decreased stride length       General Gait Details: Increased lateral trunk sway and decreased pelvic rotation, min guard for safety, 2 brief standing rest breaks   Stairs Stairs: Yes Stairs assistance: Min guard Stair Management: One rail Left, Backwards, Sideways, Forwards, With walker Number of Stairs: 3 General stair comments: Pt negotiated 2 steps sideways with left railing and bilat hand support and then backwards with a walker to simulate home set up. Cues for sequencing/technique   Wheelchair Mobility    Modified Rankin (Stroke Patients Only)       Balance Overall balance assessment: Needs assistance Sitting-balance support: Feet supported, No upper extremity supported Sitting balance-Leahy Scale: Good     Standing balance support: Bilateral upper extremity supported, During functional activity Standing balance-Leahy Scale: Fair                              Cognition Arousal/Alertness: Awake/alert Behavior During Therapy: WFL for tasks assessed/performed Overall Cognitive Status: Within Functional Limits for tasks assessed                                          Exercises      General Comments        Pertinent Vitals/Pain Pain Assessment Pain Assessment: Faces Faces Pain Scale: Hurts a little bit Pain Location: bilateral dorsum of foot Pain Descriptors / Indicators: Sore Pain Intervention(s): Monitored during session  Home Living                          Prior Function            PT Goals (current goals can now be found in the care plan section) Acute Rehab PT Goals Patient Stated Goal: home Potential to Achieve Goals: Good Progress towards PT goals: Progressing toward goals    Frequency    Min 3X/week      PT  Plan Current plan remains appropriate    Co-evaluation              AM-PAC PT "6 Clicks" Mobility   Outcome Measure  Help needed turning from your back to your side while in a flat bed without using bedrails?: None Help needed moving from lying on your back to sitting on the side of a flat bed without using bedrails?: None Help needed moving to and from a bed to a chair (including a wheelchair)?: A Little Help needed standing up from a chair using your arms (e.g., wheelchair or bedside chair)?: None Help needed to walk in hospital room?: A Little Help needed climbing 3-5 steps with a railing? : A Little 6 Click Score: 21    End of Session Equipment Utilized During Treatment: Gait belt Activity Tolerance: Patient tolerated treatment well Patient left: in bed;with bed alarm set;with family/visitor present;with call bell/phone within reach Nurse Communication: Mobility status PT Visit Diagnosis: Unsteadiness on feet (R26.81);Other abnormalities of gait and mobility (R26.89);Muscle weakness (generalized) (M62.81)     Time: 3244-0102 PT Time Calculation (min) (ACUTE ONLY): 25 min  Charges:  $Gait Training: 8-22 mins $Therapeutic Activity: 8-22 mins                     Lillia Pauls, PT, DPT Acute Rehabilitation Services Office [phone]    Norval Morton 07/17/2022, 9:24 AM

## 2022-07-18 ENCOUNTER — Encounter (HOSPITAL_COMMUNITY): Payer: Self-pay | Admitting: General Surgery

## 2022-07-18 DIAGNOSIS — N179 Acute kidney failure, unspecified: Secondary | ICD-10-CM | POA: Diagnosis not present

## 2022-07-18 DIAGNOSIS — M6282 Rhabdomyolysis: Secondary | ICD-10-CM | POA: Diagnosis not present

## 2022-07-18 DIAGNOSIS — E119 Type 2 diabetes mellitus without complications: Secondary | ICD-10-CM | POA: Diagnosis not present

## 2022-07-18 DIAGNOSIS — G7000 Myasthenia gravis without (acute) exacerbation: Secondary | ICD-10-CM | POA: Diagnosis not present

## 2022-07-18 LAB — RENAL FUNCTION PANEL
Albumin: 2.7 g/dL — ABNORMAL LOW (ref 3.5–5.0)
Anion gap: 12 (ref 5–15)
BUN: 36 mg/dL — ABNORMAL HIGH (ref 6–20)
CO2: 22 mmol/L (ref 22–32)
Calcium: 7.9 mg/dL — ABNORMAL LOW (ref 8.9–10.3)
Chloride: 106 mmol/L (ref 98–111)
Creatinine, Ser: 1.79 mg/dL — ABNORMAL HIGH (ref 0.44–1.00)
GFR, Estimated: 32 mL/min — ABNORMAL LOW (ref >60)
Glucose, Bld: 192 mg/dL — ABNORMAL HIGH (ref 70–99)
Phosphorus: 2.8 mg/dL (ref 2.5–4.6)
Potassium: 2.6 mmol/L — CL (ref 3.5–5.1)
Sodium: 140 mmol/L (ref 135–145)

## 2022-07-18 LAB — GLUCOSE, CAPILLARY
Glucose-Capillary: 194 mg/dL — ABNORMAL HIGH (ref 70–99)
Glucose-Capillary: 138 mg/dL — ABNORMAL HIGH (ref 70–99)
Glucose-Capillary: 142 mg/dL — ABNORMAL HIGH (ref 70–99)
Glucose-Capillary: 180 mg/dL — ABNORMAL HIGH (ref 70–99)
Glucose-Capillary: 160 mg/dL — ABNORMAL HIGH (ref 70–99)

## 2022-07-18 LAB — LIPID PANEL
Cholesterol: 220 mg/dL — ABNORMAL HIGH (ref 0–200)
HDL: 40 mg/dL — ABNORMAL LOW (ref >40)
LDL Cholesterol: UNDETERMINED mg/dL (ref 0–99)
Total CHOL/HDL Ratio: 5.5 RATIO
Triglycerides: 500 mg/dL — ABNORMAL HIGH (ref <150)
VLDL: UNDETERMINED mg/dL (ref 0–40)

## 2022-07-18 LAB — CBC WITH DIFFERENTIAL/PLATELET
Abs Immature Granulocytes: 1.97 10*3/uL — ABNORMAL HIGH (ref 0.00–0.07)
Basophils Absolute: 0.1 10*3/uL (ref 0.0–0.1)
Basophils Relative: 1 %
Eosinophils Absolute: 0 10*3/uL (ref 0.0–0.5)
Eosinophils Relative: 0 %
HCT: 26 % — ABNORMAL LOW (ref 36.0–46.0)
Hemoglobin: 8.5 g/dL — ABNORMAL LOW (ref 12.0–15.0)
Immature Granulocytes: 13 %
Lymphocytes Relative: 8 %
Lymphs Abs: 1.2 10*3/uL (ref 0.7–4.0)
MCH: 32 pg (ref 26.0–34.0)
MCHC: 32.7 g/dL (ref 30.0–36.0)
MCV: 97.7 fL (ref 80.0–100.0)
Monocytes Absolute: 0.9 10*3/uL (ref 0.1–1.0)
Monocytes Relative: 6 %
Neutro Abs: 10.7 10*3/uL — ABNORMAL HIGH (ref 1.7–7.7)
Neutrophils Relative %: 72 %
Platelets: 175 10*3/uL (ref 150–400)
RBC: 2.66 MIL/uL — ABNORMAL LOW (ref 3.87–5.11)
RDW: 13.7 % (ref 11.5–15.5)
WBC: 15 10*3/uL — ABNORMAL HIGH (ref 4.0–10.5)
nRBC: 0.5 % — ABNORMAL HIGH (ref 0.0–0.2)

## 2022-07-18 LAB — ANTINUCLEAR ANTIBODIES, IFA: ANA Ab, IFA: NEGATIVE

## 2022-07-18 LAB — LDL CHOLESTEROL, DIRECT: Direct LDL: 107 mg/dL — ABNORMAL HIGH (ref 0–99)

## 2022-07-18 LAB — CK: Total CK: 126 U/L (ref 38–234)

## 2022-07-18 LAB — MAGNESIUM: Magnesium: 1.8 mg/dL (ref 1.7–2.4)

## 2022-07-18 MED ORDER — INSULIN ASPART 100 UNIT/ML IJ SOLN
4.0000 [IU] | Freq: Three times a day (TID) | INTRAMUSCULAR | Status: DC
  Administered 2022-07-18 – 2022-07-19 (×4): 4 [IU] via SUBCUTANEOUS

## 2022-07-18 MED ORDER — ORAL CARE MOUTH RINSE: 15.0000 mL | OROMUCOSAL | Status: DC | PRN

## 2022-07-18 MED ORDER — POTASSIUM CHLORIDE CRYS ER 20 MEQ PO TBCR
40.0000 meq | EXTENDED_RELEASE_TABLET | ORAL | Status: AC
  Administered 2022-07-18 (×3): 40 meq via ORAL
  Filled 2022-07-18 (×3): qty 2

## 2022-07-18 MED ORDER — INSULIN ASPART 100 UNIT/ML IJ SOLN: 4.0000 [IU] | Freq: Three times a day (TID) | INTRAMUSCULAR | Status: DC

## 2022-07-18 NOTE — Progress Notes (Addendum)
NEUROLOGY CONSULTATION PROGRESS NOTE   Date of service: July 18, 2022 Patient Name: Teresa Lee MRN:  [id number] DOB:  Jan 28, 1961  Interval Hx  Patient seen and examined this morning. Started on p.o. steroids.  Reports weakness while working with PT. Status post muscle biopsy yesterday.  Reports some bleeding from the incision site which has resolved.  Vitals   Vitals:   07/17/22 2135 07/18/22 0143 07/18/22 0554 07/18/22 0727  BP: (!) 155/74 (!) 149/67 (!) 153/75 135/72  Pulse: (!) 59 84 (!) 57   Resp: 16 16 16 18   Temp: 98.4 F (36.9 C) 98.2 F (36.8 C) 97.8 F (36.6 C) 98 F (36.7 C)  TempSrc: Oral Oral Oral   SpO2: 97% 98% 97%   Weight:      Height:         Body mass index is 30.66 kg/m.  Physical Exam   General: Laying comfortably in bed; in no acute distress.  HENT: Normal oropharynx and mucosa. Normal external appearance of ears and nose.  Neck: Supple, no pain or tenderness  CV: No JVD. No peripheral edema.  Pulmonary: Symmetric Chest rise. Normal respiratory effort.  Abdomen: Soft to touch, non-tender.  Ext: No cyanosis, edema, or deformity  Skin: CDI incision on left thigh Musculoskeletal: Normal digits and nails by inspection. No clubbing.   Neurologic Examination  Mental status/Cognition: Alert, oriented to self, place, month and year, good attention.  Speech/language: Fluent, comprehension intact, object naming intact, repetition intact.  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 Motor: Reveals symmetric, very subtly fatigable weakness which is at least 4+/5 in the proximal muscles and 5/5 in the distal muscles. Sensation: Intact to light touch all over  Coordination/Complex Motor: No gross dysmetria  Labs   Basic  Metabolic Panel:  Lab Results  Component Value Date   NA 140 07/18/2022   K 2.6 (LL) 07/18/2022   CO2 22 07/18/2022   GLUCOSE 192 (H) 07/18/2022   BUN 36 (H) 07/18/2022   CREATININE 1.79 (H) 07/18/2022   CALCIUM 7.9 (L) 07/18/2022   GFRNONAA 32 (L) 07/18/2022   GFRAA >60 02/25/2019   HbA1c:  Lab Results  Component Value Date   HGBA1C 8.1 (H) 07/13/2022   LDL:  Lab Results  Component Value Date   LDLCALC UNABLE TO CALCULATE IF TRIGLYCERIDE OVER 400 mg/dL 78/29/5621   Imaging and Diagnostic studies   CT Head without contrast(Personally reviewed): 1.  No evidence of an acute intracranial abnormality. 2. Mild cerebral white matter disease, nonspecific but most often secondary to chronic small vessel ischemia. 3. Redemonstrated prominent perivascular space versus chronic lacunar infarct within the right external capsule/basal ganglia. 4. Unchanged from prior examinations, there is focal irregularity and dilation of the anterior body of the right lateral ventricle. Although not definitively identified, closed lip schizencephaly cannot be excluded. Consider a non-emergent brain MRI for further evaluation (if not already performed at an outside institution). 5. Absence of the septum pellucidum.  Lab Results  Component Value Date   CKTOTAL 126 07/18/2022   Lab Results  Component Value Date   NA 140 07/18/2022   K 2.6 (LL) 07/18/2022   CO2 22 07/18/2022   GLUCOSE 192 (H) 07/18/2022   BUN 36 (H) 07/18/2022   CREATININE 1.79 (H) 07/18/2022   CALCIUM 7.9 (  L) 07/18/2022   GFRNONAA 32 (L) 07/18/2022   Impression   Teresa Lee is a 62 y.o. female with PMH significant for myasthenia gravis on as needed Mestinon and CellCept 1000 twice daily, fibromyalgia, GERD, hypertension, hyperlipidemia who is currently admitted to the hospital with progressive generalized weakness and muscle aches over the last few months.  She was found to have rhabdomyolysis and AKI.  Overall,  presentation is concerning for idiopathic inflammatory myositis. No skin lesions concerning for dermatomyositis. Could be statin induced myositis, polymyositis, inclusion body myositis, antisynthetase syndrome, myositis secondary to rheum disorder or overlap syndrome. No viral symptoms around the time of onset of these symptoms that patient can recall and thus not particularly concerning for viral myositis.   I think she is stable from a myasthenia standpoint and is not experiencing her typical MG symptoms.  Recommendations  -Completed 5 days of high-dose steroids and started on prednisone 50. - ANA IFA negative -serum Myomarker 3 plus panel is pending. - ENA neg. - MRI left femur with and without contrast demonstrates muscle edema and inflammation - Cardiac Monitoring. - Daily hours hours NIFs and VC while admitted. - Recommend Mestinon PO 30mg  TID PRN. If unable to swallow, can switch from PO to IV.(30mg  PO is equivalent to 1mg  IV). She rarely takes it at home. - continue Cellcept 1000mg  BID. -She has been tolerating p.o. steroids fine.  No concern for worsening myasthenia with the steroids.  Based on therapy assessments, can be discharged.  Needs follow-up with [facility], where she has already established care.  Inpatient neurology will available as needed  Plan relayed to Dr. Alanda Slim  -- Milon Dikes, MD Neurologist [facility] Pager: [phone]

## 2022-07-18 NOTE — Anesthesia Postprocedure Evaluation (Signed)
Anesthesia Post Note  Patient: Teresa Lee  Procedure(s) Performed: LEFT THIGH MUSCLE BIOPSY (Left: Thigh)     Patient location during evaluation: PACU Anesthesia Type: General Level of consciousness: awake and alert Pain management: pain level controlled Vital Signs Assessment: post-procedure vital signs reviewed and stable Respiratory status: spontaneous breathing, nonlabored ventilation, respiratory function stable and patient connected to nasal cannula oxygen Cardiovascular status: blood pressure returned to baseline and stable Postop Assessment: no apparent nausea or vomiting Anesthetic complications: no  No notable events documented.  Last Vitals:  Vitals:   07/18/22 0554 07/18/22 0727  BP: (!) 153/75 135/72  Pulse: (!) 57   Resp: 16 18  Temp: 36.6 C 36.7 C  SpO2: 97%     Last Pain:  Vitals:   07/18/22 0735  TempSrc:   PainSc: 0-No pain                 Roberto Hlavaty L Cristine Daw

## 2022-07-18 NOTE — Progress Notes (Signed)
On call diabetes coordinator paged and spoken to per MD request for patient to receive diabetes and insulin education.

## 2022-07-18 NOTE — Progress Notes (Signed)
[facility] Progress Note  1 Day Post-Op  Subjective: CC:  Pain overall controlled, patient mobilized with walker this AM.  Neurology at bedside.   Objective: Vital signs in last 24 hours: Temp:  [97.7 F (36.5 C)-98.7 F (37.1 C)] 98 F (36.7 C) (04/30 0727) Pulse Rate:  [54-84] 80 (04/30 1204) Resp:  [12-18] 18 (04/30 1204) BP: (135-198)/(65-91) 143/71 (04/30 1204) SpO2:  [94 %-98 %] 97 % (04/30 1204) Last BM Date : 07/17/22  Intake/Output from previous day: 04/29 0701 - 04/30 0700 In: 500 [I.V.:400; IV Piggyback:100] Out: 350 [Urine:350] Intake/Output this shift: No intake/output data recorded.  PE: Gen:  Alert, NAD, pleasant Card:  Regular rate and rhythm Pulm:  Normal effort Abd: Soft, non-tender, non-distended Skin: warm and dry, no rashes   L thigh incision c/d/I, SS drainage has stopped.  Psych: A&Ox3   Lab Results:  Recent Labs    07/17/22 0336 07/18/22 0629  WBC 12.1* 15.0*  HGB 8.4* 8.5*  HCT 26.3* 26.0*  PLT 179 175   BMET Recent Labs    07/17/22 0336 07/18/22 0629  NA 140 140  K 3.5 2.6*  CL 106 106  CO2 22 22  GLUCOSE 296* 192*  BUN 42* 36*  CREATININE 1.90* 1.79*  CALCIUM 8.1* 7.9*   PT/INR No results for input(s): "LABPROT", "INR" in the last 72 hours. CMP     Component Value Date/Time   NA 140 07/18/2022 0629   K 2.6 (LL) 07/18/2022 0629   CL 106 07/18/2022 0629   CO2 22 07/18/2022 0629   GLUCOSE 192 (H) 07/18/2022 0629   BUN 36 (H) 07/18/2022 0629   CREATININE 1.79 (H) 07/18/2022 0629   CREATININE 0.99 09/29/2019 1135   CALCIUM 7.9 (L) 07/18/2022 0629   PROT 5.3 (L) 07/17/2022 0336   ALBUMIN 2.7 (L) 07/18/2022 0629   AST 29 07/17/2022 0336   ALT 102 (H) 07/17/2022 0336   ALKPHOS 71 07/17/2022 0336   BILITOT 0.7 07/17/2022 0336   GFRNONAA 32 (L) 07/18/2022 0629   GFRAA >60 02/25/2019 0600   Lipase     Component Value Date/Time   LIPASE 96 (H) 09/16/2021 0826       Studies/Results: No results  found.  Anti-infectives: Anti-infectives (From admission, onward)    Start     Dose/Rate Route Frequency Ordered Stop   07/17/22 0845  ceFAZolin (ANCEF) IVPB 2g/100 mL premix        2 g 200 mL/hr over 30 Minutes Intravenous On call to O.R. 07/17/22 0756 07/17/22 1425        Assessment/Plan POD#1 s/p  left thigh muscle biopsy Incision appears clean and dry. Patient may shower. Do not need to cover incision. No need for any suture removal, sutures are absorbable. General surgery will sign off, call as needed. No outpatient follow up necessary    LOS: 8 days   I reviewed nursing notes, Consultant neurology notes, hospitalist notes, last 24 h vitals and pain scores, last 48 h intake and output, last 24 h labs and trends, and last 24 h imaging results.    Hosie Spangle, PA-C [facility] Please see Amion for pager number during day hours 7:00am-4:30pm

## 2022-07-18 NOTE — Progress Notes (Signed)
Lab called for Critical Potassium 2.6, MD notified. Awaiting new orders

## 2022-07-18 NOTE — TOC Progression Note (Signed)
Transition of Care [facility]) - Progression Note    Patient Details  Name: Teresa Lee MRN: [id number] Date of Birth: May 28, 1960  Transition of Care [facility]) CM/SW Contact  Gordy Clement, RN Phone Number: 07/18/2022, 8:44 AM  Clinical Narrative:     Patient has had rollator delivered and is set up with [facility]. CM continues to follow for any additional TOC needs. Otherwise, will DC to home when medically stable HH/DME arranged    Expected Discharge Plan: Home w Home Health Services Barriers to Discharge: No Barriers Identified  Expected Discharge Plan and Services In-house Referral: Clinical Social Work Discharge Planning Services: NA Post Acute Care Choice: Home Health, Durable Medical Equipment Living arrangements for the past 2 months: Single Family Home                 DME Arranged: Walker rolling with seat DME Agency: [employer] Date DME Agency Contacted: 07/11/22 Time DME Agency Contacted: 1326 Representative spoke with at DME Agency: Vaughan Basta HH Arranged: PT HH Agency: [facility] Date [facility] Agency Contacted: 07/12/22 Time HH Agency Contacted: 1326 Representative spoke with at [facility] Agency: Marylene Land   Social Determinants of Health (SDOH) Interventions SDOH Screenings   Food Insecurity: No Food Insecurity (07/10/2022)  Housing: Low Risk  (07/10/2022)  Transportation Needs: No Transportation Needs (07/10/2022)  Utilities: Not At Risk (07/10/2022)  Tobacco Use: Low Risk  (07/17/2022)    Readmission Risk Interventions    07/11/2022    1:25 PM 05/28/2020    4:29 PM  Readmission Risk Prevention Plan  Transportation Screening Complete Complete  PCP or Specialist Appt within 5-7 Days Complete   Home Care Screening Complete   Medication Review (RN CM) Complete   Medication Review (RN Care Manager)  Complete  HRI or Home Care Consult  Complete  SW Recovery Care/Counseling Consult  Complete  Palliative Care Screening  Not Applicable  Skilled Nursing  Facility  Not Applicable

## 2022-07-18 NOTE — Progress Notes (Addendum)
PROGRESS NOTE  Teresa Lee UJW:[id number] DOB: 1961-02-16   PCP: [name], MD  Patient is from: Home.  Reports using rolling walker at baseline.  Lately deconditioned  DOA: 07/10/2022 LOS: 8  Chief complaints Chief Complaint  Patient presents with   Weakness     Brief Narrative / Interim history: 62 year old F with PMH of OA, HTN, HLD on statin, MG who presented to [facility] ED with fatigue and generalized weakness for about 2 weeks and admitted to [facility] with nontraumatic rhabdomyolysis, AKI and elevated liver enzymes.  CK elevated to 8700.  Started on IV fluid.  Neurology consulted and recommended transfer to [facility] and started on high-dose steroid through 07/16/2022.  Patient had left hip pain.  MRI of left femur showed muscle edema and inflammation.  General surgery consulted and patient had muscle biopsy on 4/29.  Pathology pending.  Patient completed 5 days of high-dose Solu-Medrol (1 g/day) on 07/16/2022 and transitioned to p.o. prednisone with plan for slow taper per neurology.  Patient to follow-up with [facility] neurology on discharge.  Hospital course significant for new onset diabetes with A1c of 8.1%.  Needs insulin on discharge since she would be on significant dose of prednisone.     Subjective: Seen and examined earlier this morning.  No major events overnight of this morning.  Slept well but feels tired and weak after working with therapy this morning.  Also complains pain in left leg likely from muscle biopsy.  Objective: Vitals:   07/18/22 0143 07/18/22 0554 07/18/22 0727 07/18/22 1204  BP: (!) 149/67 (!) 153/75 135/72 (!) 143/71  Pulse: 84 (!) 57  80  Resp: 16 16 18 18   Temp: 98.2 F (36.8 C) 97.8 F (36.6 C) 98 F (36.7 C)   TempSrc: Oral Oral    SpO2: 98% 97%  97%  Weight:      Height:        Examination:  GENERAL: No apparent distress.  Nontoxic. HEENT: MMM.  Vision and hearing grossly intact.  NECK: Supple.  No apparent JVD.  RESP:  No  IWOB.  Fair aeration bilaterally. CVS:  RRR. Heart sounds normal.  ABD/GI/GU: BS+. Abd soft, NTND.  MSK/EXT:   No apparent deformity. Moves extremities.  Trace BLE edema. SKIN: no apparent skin lesion or wound NEURO: Awake and alert. Oriented appropriately.  No apparent focal neuro deficit. PSYCH: Calm. Normal affect.   Procedures:  4/29-muscle biopsy.  Microbiology summarized: COVID-19, influenza and RSV PCR nonreactive.  Assessment and plan: Principal Problem:   Rhabdomyolysis Active Problems:   ARF (acute renal failure) (HCC)   Non-insulin dependent type 2 diabetes mellitus (HCC)  Nontraumatic rhabdomyolysis: Has been physically deconditioned lately.  Seems she was on a statin that was discontinued indefinitely.  Has history of myasthenia gravis.  CK down to 126.  CRP 0.1.  ESR 78. -Completed 5 days of Solu-Medrol 1 g daily on 4/28 and started on p.o. prednisone with plan for slow taper -Continue monitoring  Myasthenia gravis: No signs of exacerbation.  On CellCept, prednisone, Mestinon at home. -Continue home CellCept, Mestinon -Prednisone as above. -Continue NIF/VC per RT-neurology recommends elective intubation for signs of respiratory compromise  Left and right hip pain/osteoarthritis: Right hip x-ray negative.  MRI of left femur with muscle edema & inflammation -Neurology recommended muscle biopsy which was done on 4/29.  Follow pathology. -Continue home Robaxin  Elevated LFTs: Likely due to rhabdo.  Improving. Continue monitoring   AKI: Cr trended up despite improvement in rhabdo.  Improving of IV fluid. Recent Labs    09/19/21 0800 07/10/22 1252 07/10/22 1755 07/11/22 0643 07/12/22 0545 07/13/22 0446 07/14/22 0708 07/15/22 0332 07/16/22 0257 07/17/22 0336 07/18/22 0629  BUN 14 55*  --  46* 40* 33* 38* 39* 47* 42* 36*  CREATININE 1.33* 2.76* 2.65* 2.61* 2.59* 2.90* 2.64* 2.67* 2.48* 1.90* 1.79*  -Avoid nephrotoxic meds  New NIDDM-2 with hyperglycemia  and hyperlipidemia: A1c 8.1%.  Hyperglycemia likely due to steroid.  Unable to calculate LDL due to hypertriglyceridemia. Recent Labs  Lab 07/17/22 1637 07/17/22 2116 07/18/22 0612 07/18/22 0814 07/18/22 1203  GLUCAP 228* 330* 194* 138* 142*  -Continue Semglee 25 units at night. -Continue SSI-moderate -Decrease NovoLog from 8 to 4 units 3 times daily with meals -Appreciate input by diabetic coordinator.  Leukocytosis: Mild.  Likely from steroid.  Improving.   Hypokalemia/hypomagnesemia -Monitor replenish as appropriate.  P.o. KCl 40 every 3 hours x 3 today.   Elevated TSH: TSH 10.4.  Free T4 is 0.66. -Recheck outpatient   Acute metabolic acidosis: Likely due to IV fluid. -Continue monitoring  HTN: BP elevated but improved. -Continue Toprol-XL -Increase hydralazine from 50 to 75 mg 3 times daily on 4/29. -Continue holding losartan.  Dyslipidemia/hypertriglyceridemia: Not a good candidate for statin and fenofibrate. -Recommend referral to lipid clinic on discharge   Obesity Body mass index is 30.66 kg/m. -Counseled on lifestyle change          DVT prophylaxis:  heparin injection 5,000 Units Start: 07/10/22 2200  Code Status: Full code Family Communication: None at bedside Level of care: Progressive Status is: Inpatient Remains inpatient appropriate because: Nontraumatic rhabdomyolysis   Final disposition: Home in the next 24 to 48 hours. Consultants:  Neurology General surgery  55 minutes with more than 50% spent in reviewing records, counseling patient/family and coordinating care.   Sch Meds:  Scheduled Meds:  [START ON 07/24/2022] cyanocobalamin  1,000 mcg Intramuscular Q2 months   desmopressin  0.6 mg Oral QHS   estradiol  2 mg Oral QHS   heparin  5,000 Units Subcutaneous Q8H   hydrALAZINE  75 mg Oral Q8H   insulin aspart  0-15 Units Subcutaneous TID WC   insulin aspart  0-5 Units Subcutaneous QHS   insulin aspart  8 Units Subcutaneous TID WC    insulin glargine-yfgn  26 Units Subcutaneous QHS   losartan  50 mg Oral Daily   methocarbamol  750 mg Oral QHS   metoprolol succinate  25 mg Oral QHS   mycophenolate  1,000 mg Oral BID   pantoprazole  40 mg Oral Daily   potassium chloride  40 mEq Oral Q3H   predniSONE  50 mg Oral Q breakfast   pregabalin  75 mg Oral QHS   progesterone  100 mg Oral QHS   rOPINIRole  0.5 mg Oral QHS   Vitamin D (Ergocalciferol)  50,000 Units Oral Q14 Days   Continuous Infusions:  lactated ringers Stopped (07/15/22 0729)   PRN Meds:.acetaminophen, albuterol, butalbital-acetaminophen-caffeine, diphenhydrAMINE, hydrALAZINE, HYDROcodone-acetaminophen, HYDROcodone-acetaminophen, HYDROmorphone (DILAUDID) injection, loperamide, morphine injection, ondansetron (ZOFRAN) IV, ondansetron, mouth rinse, pyridostigmine  Antimicrobials: Anti-infectives (From admission, onward)    Start     Dose/Rate Route Frequency Ordered Stop   07/17/22 0845  ceFAZolin (ANCEF) IVPB 2g/100 mL premix        2 g 200 mL/hr over 30 Minutes Intravenous On call to O.R. 07/17/22 0756 07/17/22 1425        I have personally reviewed the following labs and images: CBC: Recent  Labs  Lab 07/14/22 0708 07/15/22 0332 07/16/22 0257 07/17/22 0336 07/18/22 0629  WBC 13.8* 16.1* 12.2* 12.1* 15.0*  NEUTROABS 12.7* 14.2* 10.2* 11.7* 10.7*  HGB 9.0* 8.9* 8.5* 8.4* 8.5*  HCT 27.2* 26.9* 24.9* 26.3* 26.0*  MCV 95.1 95.1 94.0 98.9 97.7  PLT 192 181 176 179 175   BMP &GFR Recent Labs  Lab 07/12/22 0545 07/13/22 0446 07/14/22 0708 07/15/22 0332 07/16/22 0257 07/17/22 0336 07/18/22 0629  NA 138   < > 138 137 139 140 140  K 2.9*   < > 4.0 3.2* 3.9 3.5 2.6*  CL 108   < > 105 101 106 106 106  CO2 19*   < > 18* 20* 22 22 22   GLUCOSE 131*   < > 328* 323* 324* 296* 192*  BUN 40*   < > 38* 39* 47* 42* 36*  CREATININE 2.59*   < > 2.64* 2.67* 2.48* 1.90* 1.79*  CALCIUM 9.2   < > 9.0 8.6* 8.0* 8.1* 7.9*  MG 1.6*  --  1.5* 2.0  --   --   1.8  PHOS  --   --  4.4 4.3  --   --  2.8   < > = values in this interval not displayed.   Estimated Creatinine Clearance: 29.4 mL/min (A) (by C-G formula based on SCr of 1.79 mg/dL (H)). Liver & Pancreas: Recent Labs  Lab 07/13/22 0446 07/14/22 0708 07/15/22 0332 07/16/22 0257 07/17/22 0336 07/18/22 0629  AST 94* 47* 34 40 29  --   ALT 179* 151* 132* 110* 102*  --   ALKPHOS 130* 98 89 78 71  --   BILITOT 0.9 0.7 0.6 0.6 0.7  --   PROT 5.5* 5.9* 6.0* 5.3* 5.3*  --   ALBUMIN 2.5* 2.8* 2.9* 2.7* 2.7* 2.7*   No results for input(s): "LIPASE", "AMYLASE" in the last 168 hours. No results for input(s): "AMMONIA" in the last 168 hours. Diabetic: No results for input(s): "HGBA1C" in the last 72 hours.  Recent Labs  Lab 07/17/22 1637 07/17/22 2116 07/18/22 0612 07/18/22 0814 07/18/22 1203  GLUCAP 228* 330* 194* 138* 142*   Cardiac Enzymes: Recent Labs  Lab 07/13/22 0446 07/14/22 0708 07/15/22 0332 07/17/22 0336 07/18/22 0629  CKTOTAL 2,648* 1,056* 676* 280* 126   No results for input(s): "PROBNP" in the last 8760 hours. Coagulation Profile: No results for input(s): "INR", "PROTIME" in the last 168 hours. Thyroid Function Tests: No results for input(s): "TSH", "T4TOTAL", "FREET4", "T3FREE", "THYROIDAB" in the last 72 hours.  Lipid Profile: Recent Labs    07/18/22 0629  CHOL 220*  HDL 40*  LDLCALC UNABLE TO CALCULATE IF TRIGLYCERIDE OVER 400 mg/dL  TRIG 161*  CHOLHDL 5.5  LDLDIRECT 107*   Anemia Panel: No results for input(s): "VITAMINB12", "FOLATE", "FERRITIN", "TIBC", "IRON", "RETICCTPCT" in the last 72 hours. Urine analysis:    Component Value Date/Time   COLORURINE BROWN (A) 07/10/2022 1350   APPEARANCEUR CLOUDY (A) 07/10/2022 1350   LABSPEC 1.025 07/10/2022 1350   PHURINE 5.0 07/10/2022 1350   GLUCOSEU NEGATIVE 07/10/2022 1350   HGBUR LARGE (A) 07/10/2022 1350   BILIRUBINUR NEGATIVE 07/10/2022 1350   KETONESUR NEGATIVE 07/10/2022 1350   PROTEINUR  >=300 (A) 07/10/2022 1350   NITRITE NEGATIVE 07/10/2022 1350   LEUKOCYTESUR NEGATIVE 07/10/2022 1350   Sepsis Labs: Invalid input(s): "PROCALCITONIN", "LACTICIDVEN"  Microbiology: Recent Results (from the past 240 hour(s))  Resp panel by RT-PCR (RSV, Flu A&B, Covid) Urine, Clean Catch  Status: None   Collection Time: 07/10/22  1:39 PM   Specimen: Urine, Clean Catch; Nasal Swab  Result Value Ref Range Status   SARS Coronavirus 2 by RT PCR NEGATIVE NEGATIVE Final    Comment: (NOTE) SARS-CoV-2 target nucleic acids are NOT DETECTED.  The SARS-CoV-2 RNA is generally detectable in upper respiratory specimens during the acute phase of infection. The lowest concentration of SARS-CoV-2 viral copies this assay can detect is 138 copies/mL. A negative result does not preclude SARS-Cov-2 infection and should not be used as the sole basis for treatment or other patient management decisions. A negative result may occur with  improper specimen collection/handling, submission of specimen other than nasopharyngeal swab, presence of viral mutation(s) within the areas targeted by this assay, and inadequate number of viral copies(<138 copies/mL). A negative result must be combined with clinical observations, patient history, and epidemiological information. The expected result is Negative.  Fact Sheet for Patients:  BloggerCourse.com  Fact Sheet for Healthcare Providers:  SeriousBroker.it  This test is no t yet approved or cleared by the [place] FDA and  has been authorized for detection and/or diagnosis of SARS-CoV-2 by FDA under an Emergency Use Authorization (EUA). This EUA will remain  in effect (meaning this test can be used) for the duration of the COVID-19 declaration under Section 564(b)(1) of the Act, 21 U.S.C.section 360bbb-3(b)(1), unless the authorization is terminated  or revoked sooner.       Influenza A by PCR NEGATIVE  NEGATIVE Final   Influenza B by PCR NEGATIVE NEGATIVE Final    Comment: (NOTE) The Xpert Xpress SARS-CoV-2/FLU/RSV plus assay is intended as an aid in the diagnosis of influenza from Nasopharyngeal swab specimens and should not be used as a sole basis for treatment. Nasal washings and aspirates are unacceptable for Xpert Xpress SARS-CoV-2/FLU/RSV testing.  Fact Sheet for Patients: BloggerCourse.com  Fact Sheet for Healthcare Providers: SeriousBroker.it  This test is not yet approved or cleared by the [place] FDA and has been authorized for detection and/or diagnosis of SARS-CoV-2 by FDA under an Emergency Use Authorization (EUA). This EUA will remain in effect (meaning this test can be used) for the duration of the COVID-19 declaration under Section 564(b)(1) of the Act, 21 U.S.C. section 360bbb-3(b)(1), unless the authorization is terminated or revoked.     Resp Syncytial Virus by PCR NEGATIVE NEGATIVE Final    Comment: (NOTE) Fact Sheet for Patients: BloggerCourse.com  Fact Sheet for Healthcare Providers: SeriousBroker.it  This test is not yet approved or cleared by the [place] FDA and has been authorized for detection and/or diagnosis of SARS-CoV-2 by FDA under an Emergency Use Authorization (EUA). This EUA will remain in effect (meaning this test can be used) for the duration of the COVID-19 declaration under Section 564(b)(1) of the Act, 21 U.S.C. section 360bbb-3(b)(1), unless the authorization is terminated or revoked.  Performed at [facility], [address], [place], [place] [address]     Radiology Studies: No results found.    Kalianna Verbeke T. Hamdan Toscano [facility]  If 7PM-7AM, please contact night-coverage [email or web address] 07/18/2022, 12:20 PM

## 2022-07-18 NOTE — Progress Notes (Addendum)
Physical Therapy Treatment Patient Details Name: Teresa Lee MRN: [id number] DOB: [birth year] Today's Date: 07/18/2022   History of Present Illness Patient is a 62 year old female who presented on 4/22 with weakness and fatigue.Patient was admitted with rhabdomyolysis. PMH: anxiety, osteoarthritis, asthma, crohn's disease, fibromyalgia, myasthenia gravis, PLIF back surgery, spinal cord stimulator insertion    PT Comments    Pt motivated to participate in therapy session. Pt reports better night sleep last night and feels more rested, however, has increased soreness/discomfort in LLE post biopsy. Pt ambulating 200 ft with a walker at a min guard assist level; initially utilizing step to pattern, but progressing to step through pattern. After participating in hygiene task at sink, attempted standing exercises, however, pt with L knee buckling and increased pain in LLE and lower back. Assisted back to bed and RN notified for pain medication. D/c plan remains appropriate.    Recommendations for follow up therapy are one component of a multi-disciplinary discharge planning process, led by the attending physician.  Recommendations may be updated based on patient status, additional functional criteria and insurance authorization.  Follow Up Recommendations       Assistance Recommended at Discharge Intermittent Supervision/Assistance  Patient can return home with the following A little help with walking and/or transfers;A little help with bathing/dressing/bathroom;Assistance with cooking/housework;Assist for transportation;Help with stairs or ramp for entrance   Equipment Recommendations  Rollator (4 wheels)    Recommendations for Other Services       Precautions / Restrictions Precautions Precautions: Fall Restrictions Weight Bearing Restrictions: No     Mobility  Bed Mobility Overal bed mobility: Modified Independent             General bed mobility comments: HOB elevated  33 deg    Transfers Overall transfer level: Modified independent Equipment used: Rolling walker (2 wheels)               General transfer comment: Pt pushing up from walker, but safe with technique    Ambulation/Gait Ambulation/Gait assistance: Min guard Gait Distance (Feet): 200 Feet Assistive device: Rolling walker (2 wheels) Gait Pattern/deviations: Step-through pattern, Decreased stride length, Step-to pattern, Decreased stance time - left       General Gait Details: Increased lateral trunk sway and decreased pelvic rotation, min guard for safety, Minor L knee buckling towards end of walk due to fatigue and pain. cues for sequencing/technique. requiring several brief standing rest breaks   Stairs             Wheelchair Mobility    Modified Rankin (Stroke Patients Only)       Balance Overall balance assessment: Needs assistance Sitting-balance support: Feet supported, No upper extremity supported Sitting balance-Leahy Scale: Good     Standing balance support: Bilateral upper extremity supported, During functional activity Standing balance-Leahy Scale: Fair                              Cognition Arousal/Alertness: Awake/alert Behavior During Therapy: WFL for tasks assessed/performed Overall Cognitive Status: Within Functional Limits for tasks assessed                                          Exercises General Exercises - Lower Extremity Hip ABduction/ADduction: Left, Standing, Other (comment) (3 reps)    General Comments  Pertinent Vitals/Pain Pain Assessment Pain Assessment: Faces Faces Pain Scale: Hurts even more Pain Location: L thigh post biopsy Pain Descriptors / Indicators: Sore, Operative site guarding, Grimacing Pain Intervention(s): Limited activity within patient's tolerance, Monitored during session, Premedicated before session    Home Living                          Prior Function             PT Goals (current goals can now be found in the care plan section) Acute Rehab PT Goals Patient Stated Goal: home Potential to Achieve Goals: Good Progress towards PT goals: Progressing toward goals    Frequency    Min 3X/week      PT Plan Current plan remains appropriate    Co-evaluation              AM-PAC PT "6 Clicks" Mobility   Outcome Measure  Help needed turning from your back to your side while in a flat bed without using bedrails?: None Help needed moving from lying on your back to sitting on the side of a flat bed without using bedrails?: None Help needed moving to and from a bed to a chair (including a wheelchair)?: A Little Help needed standing up from a chair using your arms (e.g., wheelchair or bedside chair)?: None Help needed to walk in hospital room?: A Little Help needed climbing 3-5 steps with a railing? : A Little 6 Click Score: 21    End of Session Equipment Utilized During Treatment: Gait belt Activity Tolerance: Patient tolerated treatment well Patient left: in bed;with bed alarm set;with family/visitor present;with call bell/phone within reach Nurse Communication: Mobility status;Patient requests pain meds PT Visit Diagnosis: Unsteadiness on feet (R26.81);Other abnormalities of gait and mobility (R26.89);Muscle weakness (generalized) (M62.81)     Time: 1610-9604 PT Time Calculation (min) (ACUTE ONLY): 24 min  Charges:  $Gait Training: 8-22 mins $Therapeutic Activity: 8-22 mins                     Lillia Pauls, PT, DPT Acute Rehabilitation Services Office [phone]    Teresa Lee 07/18/2022, 8:16 AM

## 2022-07-18 NOTE — Plan of Care (Signed)

## 2022-07-18 NOTE — Progress Notes (Signed)
Inpatient Diabetes Program Recommendations  AACE/ADA: New Consensus Statement on Inpatient Glycemic Control (2015)  Target Ranges:  Prepandial:   less than 140 mg/dL      Peak postprandial:   less than 180 mg/dL (1-2 hours)      Critically ill patients:  140 - 180 mg/dL   Lab Results  Component Value Date   GLUCAP 180 (H) 07/18/2022   HGBA1C 8.1 (H) 07/13/2022    Review of Glycemic Control  Latest Reference Range & Units 07/17/22 11:51 07/17/22 14:30 07/17/22 16:37 07/17/22 21:16 07/18/22 06:12 07/18/22 08:14 07/18/22 12:03 07/18/22 15:29  Glucose-Capillary 70 - 99 mg/dL 161 (H) 096 (H) 045 (H) 330 (H) 194 (H) 138 (H) 142 (H) 180 (H)  (H): Data is abnormally high Diabetes history: No; new DM dx this admission Outpatient Diabetes medications: NA Current orders for Inpatient glycemic control: Semglee 26 units QHS, Novolog 0-15 units AC&HS, Novolog 4 units TID with meals   Inpatient Diabetes Program Recommendations:    Briefly met with patient at bedside regarding insulin pen teaching.  Demonstrated use of insulin pen to patient but she states that she really wants her husband here for teaching.  She states that he has DM and worked in the ED for over 15 years.  She states she feels uncomfortable learning it without him.  Briefly discussed that steroids increasing blood sugars and hopefully CBG's will improve as steroids reduced/stopped.   She states that husband will be at the hospital tomorrow at 10 am for teaching with her.  Requested benefits check on insulins and CGM.  DM coordinator will f/u with patient on 07/19/22.    Thanks,  Beryl Meager, RN, BC-ADM Inpatient Diabetes Coordinator Pager [phone]  (8a-5p)

## 2022-07-18 NOTE — Progress Notes (Signed)
Occupational Therapy Treatment Patient Details Name: Teresa Lee MRN: [id number] DOB: 05/16/1960 Today's Date: 07/18/2022   History of present illness Patient is a 62 year old female who presented on 4/22 with weakness and fatigue.Patient was admitted with rhabdomyolysis. S/P L thigh biopsy 4/29. PMH: anxiety, osteoarthritis, asthma, crohn's disease, fibromyalgia, myasthenia gravis, PLIF back surgery, spinal cord stimulator insertion   OT comments  Patient progressing well towards goals.  L LE discomfort post biopsy, but able to complete transfers with min guard to close supervision.  Educated on compensatory techniques for L LE with bed mobility and placement for L LE with transfers, requires cueing for hand placement and safety.  Completes toileting with min guard, grooming at sink with supervision. Will follow acutely, HHOT remains appropriate.    Recommendations for follow up therapy are one component of a multi-disciplinary discharge planning process, led by the attending physician.  Recommendations may be updated based on patient status, additional functional criteria and insurance authorization.    Assistance Recommended at Discharge Frequent or constant Supervision/Assistance  Patient can return home with the following  A little help with walking and/or transfers;A little help with bathing/dressing/bathroom;Assistance with cooking/housework;Direct supervision/assist for medications management;Assist for transportation;Help with stairs or ramp for entrance;Direct supervision/assist for financial management   Equipment Recommendations  None recommended by OT    Recommendations for Other Services      Precautions / Restrictions Precautions Precautions: Fall Restrictions Weight Bearing Restrictions: No       Mobility Bed Mobility Overal bed mobility: Needs Assistance Bed Mobility: Supine to Sit, Sit to Supine     Supine to sit: Supervision Sit to supine: Min assist    General bed mobility comments: HOB elevated, use of rails and R LE supporting L LE to come to EOB; min assist to fully bring LEs bac to supine. k    Transfers Overall transfer level: Needs assistance Equipment used: Rolling walker (2 wheels)   Sit to Stand: Supervision, Min guard           General transfer comment: min guard to supervision, cueing for hand placement and technique     Balance Overall balance assessment: Needs assistance Sitting-balance support: Feet supported, No upper extremity supported Sitting balance-Leahy Scale: Good     Standing balance support: Bilateral upper extremity supported, No upper extremity supported, During functional activity Standing balance-Leahy Scale: Fair Standing balance comment: able to groom without UE support but relies on RW dynamically                           ADL either performed or assessed with clinical judgement   ADL Overall ADL's : Needs assistance/impaired     Grooming: Wash/dry hands;Standing;Supervision/safety                   Toilet Transfer: Min guard;Ambulation;Rolling walker (2 wheels)   Toileting- Clothing Manipulation and Hygiene: Min guard;Sit to/from stand;Sitting/lateral lean       Functional mobility during ADLs: Min guard;Rolling walker (2 wheels)      Extremity/Trunk Assessment              Vision       Perception     Praxis      Cognition Arousal/Alertness: Awake/alert Behavior During Therapy: WFL for tasks assessed/performed Overall Cognitive Status: Within Functional Limits for tasks assessed  Exercises      Shoulder Instructions       General Comments spouse present and supportive    Pertinent Vitals/ Pain       Pain Assessment Pain Assessment: Faces Faces Pain Scale: Hurts little more Pain Location: L thigh post biopsy Pain Descriptors / Indicators: Sore, Operative site guarding,  Grimacing Pain Intervention(s): Limited activity within patient's tolerance, Monitored during session, Repositioned  Home Living                                          Prior Functioning/Environment              Frequency  Min 2X/week        Progress Toward Goals  OT Goals(current goals can now be found in the care plan section)  Progress towards OT goals: Progressing toward goals  Acute Rehab OT Goals Patient Stated Goal: less pain OT Goal Formulation: With patient Time For Goal Achievement: 07/25/22 Potential to Achieve Goals: Fair  Plan Discharge plan remains appropriate;Frequency remains appropriate    Co-evaluation                 AM-PAC OT "6 Clicks" Daily Activity     Outcome Measure   Help from another person eating meals?: None Help from another person taking care of personal grooming?: A Little Help from another person toileting, which includes using toliet, bedpan, or urinal?: A Little Help from another person bathing (including washing, rinsing, drying)?: A Little Help from another person to put on and taking off regular upper body clothing?: A Little Help from another person to put on and taking off regular lower body clothing?: A Little 6 Click Score: 19    End of Session Equipment Utilized During Treatment: Rolling walker (2 wheels)  OT Visit Diagnosis: Unsteadiness on feet (R26.81);Other abnormalities of gait and mobility (R26.89);Muscle weakness (generalized) (M62.81)   Activity Tolerance Patient tolerated treatment well   Patient Left in bed;with call bell/phone within reach;with bed alarm set;with family/visitor present   Nurse Communication Mobility status        Time: 1610-9604 OT Time Calculation (min): 21 min  Charges: OT General Charges $OT Visit: 1 Visit OT Treatments $Self Care/Home Management : 8-22 mins  Barry Brunner, OT Acute Rehabilitation Services Office [phone]   Chancy Milroy 07/18/2022, 12:54 PM

## 2022-07-19 ENCOUNTER — Other Ambulatory Visit (HOSPITAL_COMMUNITY): Payer: Self-pay

## 2022-07-19 ENCOUNTER — Telehealth (HOSPITAL_COMMUNITY): Payer: Self-pay | Admitting: Pharmacy Technician

## 2022-07-19 DIAGNOSIS — E119 Type 2 diabetes mellitus without complications: Secondary | ICD-10-CM | POA: Diagnosis not present

## 2022-07-19 DIAGNOSIS — M6282 Rhabdomyolysis: Secondary | ICD-10-CM | POA: Diagnosis not present

## 2022-07-19 DIAGNOSIS — N179 Acute kidney failure, unspecified: Secondary | ICD-10-CM | POA: Diagnosis not present

## 2022-07-19 LAB — CBC WITH DIFFERENTIAL/PLATELET
Abs Immature Granulocytes: 1.3 10*3/uL — ABNORMAL HIGH (ref 0.00–0.07)
Band Neutrophils: 1 %
Basophils Absolute: 0 10*3/uL (ref 0.0–0.1)
Basophils Relative: 0 %
Eosinophils Absolute: 0.4 10*3/uL (ref 0.0–0.5)
Eosinophils Relative: 2 %
HCT: 29 % — ABNORMAL LOW (ref 36.0–46.0)
Hemoglobin: 9.2 g/dL — ABNORMAL LOW (ref 12.0–15.0)
Lymphocytes Relative: 14 %
Lymphs Abs: 2.7 10*3/uL (ref 0.7–4.0)
MCH: 31.7 pg (ref 26.0–34.0)
MCHC: 31.7 g/dL (ref 30.0–36.0)
MCV: 100 fL (ref 80.0–100.0)
Monocytes Absolute: 0.2 10*3/uL (ref 0.1–1.0)
Monocytes Relative: 1 %
Myelocytes: 6 %
Neutro Abs: 14.5 10*3/uL — ABNORMAL HIGH (ref 1.7–7.7)
Neutrophils Relative %: 75 %
Platelets: 188 10*3/uL (ref 150–400)
Promyelocytes Relative: 1 %
RBC: 2.9 MIL/uL — ABNORMAL LOW (ref 3.87–5.11)
RDW: 14 % (ref 11.5–15.5)
WBC: 19.1 10*3/uL — ABNORMAL HIGH (ref 4.0–10.5)
nRBC: 0.3 % — ABNORMAL HIGH (ref 0.0–0.2)
nRBC: 1 /100 WBC — ABNORMAL HIGH

## 2022-07-19 LAB — BASIC METABOLIC PANEL
Anion gap: 7 (ref 5–15)
BUN: 32 mg/dL — ABNORMAL HIGH (ref 6–20)
CO2: 24 mmol/L (ref 22–32)
Calcium: 7.9 mg/dL — ABNORMAL LOW (ref 8.9–10.3)
Chloride: 109 mmol/L (ref 98–111)
Creatinine, Ser: 1.43 mg/dL — ABNORMAL HIGH (ref 0.44–1.00)
GFR, Estimated: 42 mL/min — ABNORMAL LOW (ref >60)
Glucose, Bld: 90 mg/dL (ref 70–99)
Potassium: 3 mmol/L — ABNORMAL LOW (ref 3.5–5.1)
Sodium: 140 mmol/L (ref 135–145)

## 2022-07-19 LAB — GLUCOSE, CAPILLARY
Glucose-Capillary: 95 mg/dL (ref 70–99)
Glucose-Capillary: 125 mg/dL — ABNORMAL HIGH (ref 70–99)

## 2022-07-19 LAB — CK: Total CK: 116 U/L (ref 38–234)

## 2022-07-19 MED ORDER — BLOOD GLUCOSE TEST VI STRP: 1.0000 | ORAL_STRIP | Freq: Three times a day (TID) | 3 refills | Status: AC

## 2022-07-19 MED ORDER — HYDRALAZINE HCL 25 MG PO TABS: 75.0000 mg | ORAL_TABLET | Freq: Three times a day (TID) | ORAL | 3 refills | Status: DC

## 2022-07-19 MED ORDER — PREDNISONE 10 MG PO TABS: ORAL_TABLET | ORAL | 0 refills | Status: AC

## 2022-07-19 MED ORDER — PEN NEEDLES 32G X 4 MM MISC: 100.0000 [IU] | Freq: Three times a day (TID) | 3 refills | Status: DC

## 2022-07-19 MED ORDER — BLOOD GLUCOSE MONITORING SUPPL DEVI: 1.0000 | Freq: Three times a day (TID) | 0 refills | Status: AC

## 2022-07-19 MED ORDER — DEXCOM G7 RECEIVER DEVI: 1.0000 [IU] | Freq: Every day | 0 refills | Status: DC

## 2022-07-19 MED ORDER — POTASSIUM CHLORIDE CRYS ER 20 MEQ PO TBCR
40.0000 meq | EXTENDED_RELEASE_TABLET | Freq: Two times a day (BID) | ORAL | Status: DC
  Administered 2022-07-19: 40 meq via ORAL
  Filled 2022-07-19: qty 2

## 2022-07-19 MED ORDER — "PEN NEEDLES 1/2"" 29G X 12MM MISC": 100.0000 [IU] | Freq: Three times a day (TID) | 0 refills | Status: DC

## 2022-07-19 MED ORDER — INSULIN ASPART 100 UNIT/ML FLEXPEN: PEN_INJECTOR | SUBCUTANEOUS | 11 refills | Status: DC

## 2022-07-19 MED ORDER — INSULIN GLARGINE 100 UNIT/ML SOLOSTAR PEN: 25.0000 [IU] | PEN_INJECTOR | Freq: Every day | SUBCUTANEOUS | 11 refills | Status: DC

## 2022-07-19 MED ORDER — INSULIN GLARGINE 100 UNIT/ML SOLOSTAR PEN: 20.0000 [IU] | PEN_INJECTOR | Freq: Every day | SUBCUTANEOUS | 11 refills | Status: DC

## 2022-07-19 MED ORDER — LANCET DEVICE MISC: 1.0000 | Freq: Three times a day (TID) | 0 refills | Status: AC

## 2022-07-19 MED ORDER — LANCETS MISC. MISC: 1.0000 | Freq: Three times a day (TID) | 3 refills | Status: AC

## 2022-07-19 NOTE — Plan of Care (Signed)

## 2022-07-19 NOTE — TOC Transition Note (Signed)
Transition of Care [facility]) - CM/SW Discharge Note   Patient Details  Name: Teresa Lee MRN: [id number] Date of Birth: 1960/11/25  [facility] CM/SW Contact:  Kermit Balo, RN Phone Number: 07/19/2022, 1:25 PM   Clinical Narrative:    Pt is discharging home with home health services through [facility]. Information on the AVS. [facility] will contact her for the first home visit. Rollator for home has been delivered.  Spouse to provide needed transportation home.   Final next level of care: Home w Home Health Services Barriers to Discharge: No Barriers Identified   Patient Goals and CMS Choice CMS Medicare.gov Compare Post Acute Care list provided to:: Patient Choice offered to / list presented to : Patient  Discharge Placement                         Discharge Plan and Services Additional resources added to the After Visit Summary for   In-house Referral: Clinical Social Work Discharge Planning Services: NA Post Acute Care Choice: Home Health, Durable Medical Equipment          DME Arranged: Walker rolling with seat DME Agency: [employer] Date DME Agency Contacted: 07/11/22 Time DME Agency Contacted: 1326 Representative spoke with at DME Agency: Vaughan Basta HH Arranged: PT HH Agency: [facility] Date [facility] Agency Contacted: 07/12/22 Time HH Agency Contacted: 1326 Representative spoke with at [facility] Agency: Marylene Land  Social Determinants of Health (SDOH) Interventions SDOH Screenings   Food Insecurity: No Food Insecurity (07/10/2022)  Housing: Low Risk  (07/10/2022)  Transportation Needs: No Transportation Needs (07/10/2022)  Utilities: Not At Risk (07/10/2022)  Tobacco Use: Low Risk  (07/18/2022)     Readmission Risk Interventions    07/11/2022    1:25 PM 05/28/2020    4:29 PM  Readmission Risk Prevention Plan  Transportation Screening Complete Complete  PCP or Specialist Appt within 5-7 Days Complete   Home Care Screening Complete    Medication Review (RN CM) Complete   Medication Review (RN Care Manager)  Complete  HRI or Home Care Consult  Complete  SW Recovery Care/Counseling Consult  Complete  Palliative Care Screening  Not Applicable  Skilled Nursing Facility  Not Applicable

## 2022-07-19 NOTE — TOC Benefit Eligibility Note (Signed)
Patient Product/process development scientist completed.    The patient is currently admitted and upon discharge could be taking Lantus Pen.  The current 30 day co-pay is $35.00.   The patient is currently admitted and upon discharge could be taking Dexcom G7 Sensors.  The current 30 day co-pay is $74.90.   The patient is currently admitted and upon discharge could be taking Novolog FlexPen.  The current 30 day co-pay is $35.00.   The patient is currently admitted and upon discharge could be taking [employer].  The current 30 day co-pay is $74.90.   The patient is insured through [employer] Part D   This test claim was processed through [facility]- copay amounts may vary at other pharmacies due to pharmacy/plan contracts, or as the patient moves through the different stages of their insurance plan.  Teresa Lee, CPHT Pharmacy Patient Advocate Specialist [facility] Pharmacy Patient Advocate Team Direct Number: [phone]  Fax: [phone]

## 2022-07-19 NOTE — Progress Notes (Signed)
NIF -35 & -40 VC 1.8 L x 2 attempts.  Good effort noted.

## 2022-07-19 NOTE — Inpatient Diabetes Management (Signed)
Inpatient Diabetes Program Recommendations  AACE/ADA: New Consensus Statement on Inpatient Glycemic Control (2015)  Target Ranges:  Prepandial:   less than 140 mg/dL      Peak postprandial:   less than 180 mg/dL (1-2 hours)      Critically ill patients:  140 - 180 mg/dL   Lab Results  Component Value Date   GLUCAP 95 07/19/2022   HGBA1C 8.1 (H) 07/13/2022    Review of Glycemic Control  Diabetes history: New DM 2 diagnosed this admission  Current orders for Inpatient glycemic control:  Semglee 26 units qhs Novolog 0-15 units tid + hs Novolog 4 units tid meal coverage  PO prednisone 50 mg Daily  Inpatient Diabetes Program Recommendations:    Discharge Recommendations: Long acting recommendations: Insulin Glargine (LANTUS) Solostar Pen 26 units qhs  Short acting recommendations:  Meal + Correction coverage Insulin aspart (NOVOLOG) FlexPen  Moderate Scale.  0-18 units Supply/Referral recommendations: Glucometer Test strips Lancet device Lancets Pen needles - standard Dexcom G7 Reader order [phone]   Spoke with pt and husband at bedside. Reviewed teaching. Showed pt and husband the operation of insulin pen. Pt ad husband with return demonstration. Will attached QR code with video as a reminder when they are at home. They had many questions regarding diet that I answered. Husband decided they would try the Dexcom sensor, he does not prefer the [place] as he has tried it himself in the past. Husband is requesting a regular glucometer for home use as well. Pt phone is not compatible with dexcom G7 will need reader prescribed at discharge.  Thanks,  Christena Deem RN, MSN, BC-ADM Inpatient Diabetes Coordinator Team Pager [phone] (8a-5p)

## 2022-07-19 NOTE — Plan of Care (Signed)
Patient remains in the hospital as primary team is still managing the nontraumatic rhabdomyolysis. No further new inpatient neurological recommendations at this time I would recommend follow-up with [facility] neurology ASAP and follow-up of the muscle biopsy results outpatient. Plan discussed with Dr. Blake Divine  -- Milon Dikes, MD Neurologist [facility] Pager: [phone]

## 2022-07-19 NOTE — Telephone Encounter (Signed)
Pharmacy Patient Advocate Encounter  Insurance verification completed.    The patient is insured through [employer] Part D   The patient is currently admitted and ran test claims for the following: Lantus Pen, Novolog Flexpen, Dexcom G7, [employer].  Copays and coinsurance results were relayed to Inpatient clinical team.

## 2022-07-19 NOTE — Plan of Care (Signed)
  Problem: Education: Goal: Knowledge of General Education information will improve Description: Including pain rating scale, medication(s)/side effects and non-pharmacologic comfort measures Outcome: Adequate for Discharge   Problem: Health Behavior/Discharge Planning: Goal: Ability to manage health-related needs will improve Outcome: Adequate for Discharge   Problem: Clinical Measurements: Goal: Ability to maintain clinical measurements within normal limits will improve Outcome: Adequate for Discharge Goal: Will remain free from infection Outcome: Adequate for Discharge Goal: Diagnostic test results will improve Outcome: Adequate for Discharge Goal: Respiratory complications will improve Outcome: Adequate for Discharge Goal: Cardiovascular complication will be avoided Outcome: Adequate for Discharge   Problem: Activity: Goal: Risk for activity intolerance will decrease Outcome: Adequate for Discharge   Problem: Nutrition: Goal: Adequate nutrition will be maintained Outcome: Adequate for Discharge   Problem: Coping: Goal: Level of anxiety will decrease Outcome: Adequate for Discharge   Problem: Elimination: Goal: Will not experience complications related to bowel motility Outcome: Adequate for Discharge Goal: Will not experience complications related to urinary retention Outcome: Adequate for Discharge   Problem: Pain Managment: Goal: General experience of comfort will improve Outcome: Adequate for Discharge   Problem: Safety: Goal: Ability to remain free from injury will improve Outcome: Adequate for Discharge   Problem: Skin Integrity: Goal: Risk for impaired skin integrity will decrease Outcome: Adequate for Discharge   Problem: Acute Rehab OT Goals (only OT should resolve) Goal: Pt. Will Perform Lower Body Dressing Outcome: Adequate for Discharge Goal: Pt. Will Transfer To Toilet Outcome: Adequate for Discharge Goal: Pt. Will Perform Toileting-Clothing  Manipulation Outcome: Adequate for Discharge   Problem: Education: Goal: Ability to describe self-care measures that may prevent or decrease complications (Diabetes Survival Skills Education) will improve Outcome: Adequate for Discharge Goal: Individualized Educational Video(s) Outcome: Adequate for Discharge   Problem: Coping: Goal: Ability to adjust to condition or change in health will improve Outcome: Adequate for Discharge   Problem: Fluid Volume: Goal: Ability to maintain a balanced intake and output will improve Outcome: Adequate for Discharge   Problem: Health Behavior/Discharge Planning: Goal: Ability to identify and utilize available resources and services will improve Outcome: Adequate for Discharge Goal: Ability to manage health-related needs will improve Outcome: Adequate for Discharge   Problem: Metabolic: Goal: Ability to maintain appropriate glucose levels will improve Outcome: Adequate for Discharge   Problem: Nutritional: Goal: Maintenance of adequate nutrition will improve Outcome: Adequate for Discharge Goal: Progress toward achieving an optimal weight will improve Outcome: Adequate for Discharge   Problem: Skin Integrity: Goal: Risk for impaired skin integrity will decrease Outcome: Adequate for Discharge   Problem: Tissue Perfusion: Goal: Adequacy of tissue perfusion will improve Outcome: Adequate for Discharge

## 2022-07-19 NOTE — Discharge Summary (Signed)
Physician Discharge Summary   Patient: Teresa Lee MRN: [id number] DOB: [birth year]  Admit date:     07/10/2022  Discharge date: 07/19/22  Discharge Physician: Kathlen Mody   PCP: [name], MD   Recommendations at discharge:  Please follow up with [facility] neurology as recommended.  Please follow up with cbc and bmp on Friday.  Please follow up with PCP In one week.   Discharge Diagnoses: Principal Problem:   Rhabdomyolysis Active Problems:   ARF (acute renal failure) (HCC)   Non-insulin dependent type 2 diabetes mellitus [facility])    Hospital Course: 62 year old F with PMH of OA, HTN, HLD on statin, MG who presented to [facility] ED with fatigue and generalized weakness for about 2 weeks and admitted to [facility] with nontraumatic rhabdomyolysis, AKI and elevated liver enzymes.  CK elevated to 8700.  Started on IV fluid.  Neurology consulted and recommended transfer to [facility] and started on high-dose steroid through 07/16/2022.  Patient had left hip pain.  MRI of left femur showed muscle edema and inflammation.  General surgery consulted and patient had muscle biopsy on 4/29.  Pathology pending.   Patient completed 5 days of high-dose Solu-Medrol (1 g/day) on 07/16/2022 and transitioned to p.o. prednisone with plan for slow taper per neurology.  Patient to follow-up with [facility] neurology on discharge.   Hospital course significant for new onset diabetes with A1c of 8.1%.  Needs insulin on discharge since she would be on significant dose of prednisone.  Assessment and Plan:   Nontraumatic rhabdomyolysis: Has been physically deconditioned lately.  Seems she was on a statin that was discontinued indefinitely.  Has history of myasthenia gravis.  CK down to 126.  CRP 0.1.  ESR 78. -Completed 5 days of Solu-Medrol 1 g daily on 4/28 and started on p.o. prednisone with plan for slow taper -recommend outpatient follow up with Neurology as scheduled.    Myasthenia gravis: No signs of  exacerbation.  On CellCept, prednisone, Mestinon at home. -Continue home CellCept, Mestinon -Prednisone as above.    Left and right hip pain/osteoarthritis: Right hip x-ray negative.  MRI of left femur with muscle edema & inflammation -Neurology recommended muscle biopsy which was done on 4/29.  Follow pathology. -Continue home Robaxin   Elevated LFTs: Likely due to rhabdo.  Improving. Continue monitoring   AKI:  Creatinine improving. With fluids. Recommend checking labs on Friday and follow upwith PCp .    New NIDDM-2 with hyperglycemia and hyperlipidemia: A1c 8.1%.   Discharged home on long acting insulin and novolog flex pen.    Leukocytosis: improved.    Hypokalemia/hypomagnesemia Replaced.    Elevated TSH: TSH 10.4.  Free T4 is 0.66. Recommend checking Thyroid panel in 2 weeks.    Acute metabolic acidosis: resolved.    HTN: BP parameters are optimal.   Dyslipidemia/hypertriglyceridemia: Not a good candidate for statin and fenofibrate. -Recommend referral to lipid clinic on discharge   Obesity Body mass index is 30.66 kg/m. -Counseled on lifestyle change   Consultants: neurology.  Procedures performed: muscle biopsy.   Disposition: Home Diet recommendation:  Discharge Diet Orders (From admission, onward)     Start     Ordered   07/19/22 0000  Diet - low sodium heart healthy        07/19/22 1242           Carb modified diet DISCHARGE MEDICATION: Allergies as of 07/19/2022       Reactions   Duloxetine Nausea Only, Other (See Comments)  Cymbalta    Latex Other (See Comments)   BLISTERS   Lorazepam Other (See Comments)   Hallucinations   Orange Concentrate [flavoring Agent] Other (See Comments)   Acid reflux   Other Hives, Nausea Only, Rash, Other (See Comments)   Some nuts cause RASHES   Percocet [oxycodone-acetaminophen] Hives   Sulfa Antibiotics Hives   Statins    Statin induced myositis from rosuvastatin. DO NOT Attempt to try other statins.    Tape Other (See Comments)   BLISTERS and BURNS THE SKIN   Bioflavonoids Rash   Causes burning of skin and blisters   Cefixime Other (See Comments)   Due to myasthenia gravis   Nucynta [tapentadol] Other (See Comments)   Headache         Medication List     STOP taking these medications    meloxicam 15 MG tablet Commonly known as: MOBIC       TAKE these medications    AeroChamber Plus inhaler Use as instructed to use with inahaler.   albuterol 108 (90 Base) MCG/ACT inhaler Commonly known as: VENTOLIN HFA 1 PUFF EVERY 6 HOURS AS NEEDED FOR WHEEZING What changed: See the new instructions.   albuterol (2.5 MG/3ML) 0.083% nebulizer solution Commonly known as: PROVENTIL Take 2.5 mg by nebulization every 6 (six) hours as needed for shortness of breath. What changed: Another medication with the same name was changed. Make sure you understand how and when to take each.   Blood Glucose Monitoring Suppl Devi 1 each by Does not apply route in the morning, at noon, and at bedtime. May substitute to any manufacturer covered by patient's insurance.   BLOOD GLUCOSE TEST STRIPS Strp 1 each by In Vitro route in the morning, at noon, and at bedtime. May substitute to any manufacturer covered by patient's insurance.   cyanocobalamin 1000 MCG/ML injection Commonly known as: VITAMIN B12 Inject 1,000 mcg into the muscle See admin instructions. Inject 1,000 mcg into the muscle every 6-8 weeks   desmopressin 0.2 MG tablet Commonly known as: DDAVP Take 0.6 mg by mouth at bedtime.   Dexcom G7 Receiver Devi 1 Units by Does not apply route daily.   estradiol 2 MG tablet Commonly known as: ESTRACE Take 2 mg by mouth at bedtime.   hydrALAZINE 25 MG tablet Commonly known as: APRESOLINE Take 3 tablets (75 mg total) by mouth every 8 (eight) hours.   HYDROcodone-acetaminophen 10-325 MG tablet Commonly known as: NORCO Take 1 tablet by mouth 5 (five) times daily as needed for moderate  pain.   insulin aspart 100 UNIT/ML FlexPen Commonly known as: NOVOLOG CBG 70 - 120: 0 units  CBG 121 - 150: 2 units  CBG 151 - 200: 3 units  CBG 201 - 250: 5 units  CBG 251 - 300: 8 units  CBG 301 - 350: 11 units  CBG 351 - 400: 15 units   insulin glargine 100 UNIT/ML Solostar Pen Commonly known as: LANTUS Inject 25 Units into the skin at bedtime.   Lancet Device Misc 1 each by Does not apply route in the morning, at noon, and at bedtime. May substitute to any manufacturer covered by patient's insurance.   Lancets Misc. Misc 1 each by Does not apply route in the morning, at noon, and at bedtime. May substitute to any manufacturer covered by patient's insurance.   loperamide 2 MG capsule Commonly known as: IMODIUM Take 2 capsules (4 mg total) by mouth as needed for diarrhea or loose stools.  losartan 50 MG tablet Commonly known as: COZAAR Take 50 mg by mouth daily.   Mesalamine 400 MG Cpdr DR capsule Commonly known as: ASACOL Take 800 mg by mouth in the morning and at bedtime.   methocarbamol 750 MG tablet Commonly known as: ROBAXIN Take 750 mg by mouth at bedtime.   metoprolol succinate 25 MG 24 hr tablet Commonly known as: TOPROL-XL Take 25 mg by mouth at bedtime.   mycophenolate 500 MG tablet Commonly known as: CELLCEPT Take 1,000 mg by mouth in the morning and at bedtime.   naloxone 4 MG/0.1ML Liqd nasal spray kit Commonly known as: NARCAN Place 1 spray into the nose once as needed (opioid overdose).   ondansetron 4 MG tablet Commonly known as: ZOFRAN Take 4 mg by mouth every 8 (eight) hours as needed for nausea or vomiting.   pantoprazole 40 MG tablet Commonly known as: PROTONIX TAKE 1 TABLET DAILY What changed: when to take this   PEN NEEDLES 29GX1/2" 29G X Misc 100 Units by Does not apply route 3 (three) times daily before meals.   potassium chloride SA 20 MEQ tablet Commonly known as: KLOR-CON M Take 20 mEq by mouth at bedtime.    predniSONE 10 MG tablet Commonly known as: DELTASONE Prednisone 50 mg daily for 3 days followed by  Prednisone 40 mg daily for 5 days followed by  Prednisone 20 mg daily for 5 days followed by  Prednisone 10 mg daily What changed:  how much to take how to take this when to take this additional instructions   pregabalin 75 MG capsule Commonly known as: LYRICA Take 75 mg by mouth at bedtime.   progesterone 100 MG capsule Commonly known as: PROMETRIUM Take 100 mg by mouth at bedtime.   pyridostigmine 60 MG tablet Commonly known as: MESTINON Take 30 mg by mouth 3 (three) times daily as needed (for Myasthenia gravis symtoms).   rOPINIRole 1 MG tablet Commonly known as: REQUIP Take 1-2 tablets (1-2 mg total) by mouth at bedtime. What changed:  how much to take when to take this additional instructions   Symbicort 80-4.5 MCG/ACT inhaler Generic drug: budesonide-formoterol Inhale 2 puffs into the lungs 2 (two) times daily. What changed:  when to take this reasons to take this   Vitamin D (Ergocalciferol) 1.25 MG (50000 UNIT) Caps capsule Commonly known as: DRISDOL Take 50,000 Units by mouth every 14 (fourteen) days.               Durable Medical Equipment  (From admission, onward)           Start     Ordered   07/11/22 1223  For home use only DME 4 wheeled rolling walker with seat  Once       Question:  Patient needs a walker to treat with the following condition  Answer:  Muscle weakness (generalized)   07/11/22 1223            Follow-up Information     [facility] Follow up.   Why: [facility] will follow up with you at discharge to provide home health services.        [facility] Follow up.   Why: [facility] provided by [employer] information: [address] [place]  [address]  [phone]        [name], MD. Schedule an appointment as soon as possible for a visit in 1 week(s).   Specialty: Family  Medicine Contact information: [address] [place] [address] [phone]                Discharge Exam: Filed Weights   07/10/22 1210 07/10/22 1651 07/17/22 1228  Weight: 70.3 kg 72.9 kg 71.2 kg   General exam: Appears calm and comfortable  Respiratory system: Clear to auscultation. Respiratory effort normal. Cardiovascular system: S1 & S2 heard, RRR. No JVD, murmurs, rubs, gallops or clicks. No pedal edema. Gastrointestinal system: Abdomen is nondistended, soft and nontender. No organomegaly or masses felt. Normal bowel sounds heard. Central nervous system: Alert and oriented. No focal neurological deficits. Extremities: Symmetric 5 x 5 power. Skin: No rashes, lesions or ulcers Psychiatry: Judgement and insight appear normal. Mood & affect appropriate.    Condition at discharge: fair  The results of significant diagnostics from this hospitalization (including imaging, microbiology, ancillary and laboratory) are listed below for reference.   Imaging Studies: MR FEMUR LEFT W WO CONTRAST  Result Date: 07/13/2022 CLINICAL DATA:  Muscle soreness and generalized weakness. History of myasthenia gravis. EXAM: MR OF THE LEFT LOWER EXTREMITY WITHOUT AND WITH CONTRAST TECHNIQUE: Multiplanar, multisequence MR imaging of the left lower extremity was performed both before and after administration of intravenous contrast. CONTRAST:  7mL GADAVIST GADOBUTROL 1 MMOL/ML IV SOLN COMPARISON:  None Available. FINDINGS: Edema like signal changes involving the pelvic, hip and lower extremity musculature bilaterally. This most significantly involves the posterior compartment muscles of the thighs bilaterally, left slightly greater than right. Some upper involvement of the rectus femoris muscle on the left but otherwise sparing of the quadriceps musculature. These muscles also demonstrate enhancement after contrast administration. Findings are most likely due to the patient's  myasthenia gravis. Other possibilities would include post viral myositis, drug-induced myositis or polymyositis. No muscle mass. No significant bony abnormality. The hip and knee joints are maintained. The major tendons and ligaments appear intact. No significant intrapelvic abnormalities are identified. IMPRESSION: 1. Edema like signal changes and enhancement involving the pelvic, hip and lower extremity musculature bilaterally, left slightly greater than right. Findings are most likely due to the patient's myasthenia gravis. Other possibilities would include post viral myositis, drug-induced myositis or polymyositis. No muscle mass. 2. No significant bony abnormality. Electronically Signed   By: Rudie Meyer M.D.   On: 07/13/2022 13:36   DG HIP UNILAT WITH PELVIS 2-3 VIEWS RIGHT  Result Date: 07/11/2022 CLINICAL DATA:  Right hip pain. EXAM: DG HIP (WITH OR WITHOUT PELVIS) 2-3V RIGHT COMPARISON:  Pelvis and right hip radiographs 06/02/2020 FINDINGS: The bilateral sacroiliac and pubic symphysis joint spaces are maintained. Mild bilateral superomedial femoroacetabular joint space narrowing is similar to prior. L4-5 bilateral transpedicular rod and screw fusion with associated intervertebral disc spacer. No acute fracture or dislocation. The prior spinal cord stimulator generator pack is no longer visualized overlying the right lower abdominal quadrant. Vascular phleboliths overlie the pelvis. IMPRESSION: No significant osteoarthritis. Electronically Signed   By: Neita Garnet M.D.   On: 07/11/2022 14:16   CT Head Wo Contrast  Result Date: 07/10/2022 CLINICAL DATA:  Provided history: Neuro deficit, acute, stroke suspected. Nausea. Weakness. EXAM: CT HEAD WITHOUT CONTRAST TECHNIQUE: Contiguous axial images were obtained from the base of the skull through the vertex without intravenous contrast. RADIATION DOSE REDUCTION: This exam was performed according to the departmental dose-optimization program which  includes automated exposure control, adjustment of the mA and/or kV according to patient size and/or use of iterative reconstruction technique. COMPARISON:  Prior head CT examinations 06/02/2020 and 09/06/2017. FINDINGS: Brain:  No age advanced or lobar predominant parenchymal atrophy. Absence of the septum pellucidum. Unchanged from prior examinations, there is focal irregularity and dilation of the anterior body of the right lateral ventricle. Mild patchy and ill-defined hypoattenuation within the cerebral white matter, nonspecific but most often secondary to chronic small vessel ischemia. Redemonstrated prominent perivascular space versus chronic lacunar infarct within the right external capsule/basal ganglia (series 2, image 14). There is no acute intracranial hemorrhage. No demarcated cortical infarct. No extra-axial fluid collection. No evidence of an intracranial mass. No midline shift. Vascular: No hyperdense vessel. Atherosclerotic calcifications. Skull: No fracture or aggressive osseous lesion. Sinuses/Orbits: No mass or acute finding within the imaged orbits. Postsurgical appearance of the paranasal sinuses. No significant inflammatory paranasal sinus disease at the imaged levels. IMPRESSION: 1.  No evidence of an acute intracranial abnormality. 2. Mild cerebral white matter disease, nonspecific but most often secondary to chronic small vessel ischemia. 3. Redemonstrated prominent perivascular space versus chronic lacunar infarct within the right external capsule/basal ganglia. 4. Unchanged from prior examinations, there is focal irregularity and dilation of the anterior body of the right lateral ventricle. Although not definitively identified, closed lip schizencephaly cannot be excluded. Consider a non-emergent brain MRI for further evaluation (if not already performed at an outside institution). 5. Absence of the septum pellucidum. Electronically Signed   By: Jackey Loge D.O.   On: 07/10/2022 14:57    CT ABDOMEN PELVIS WO CONTRAST  Result Date: 07/10/2022 CLINICAL DATA:  Abdominal pain EXAM: CT ABDOMEN AND PELVIS WITHOUT CONTRAST TECHNIQUE: Multidetector CT imaging of the abdomen and pelvis was performed following the standard protocol without IV contrast. RADIATION DOSE REDUCTION: This exam was performed according to the departmental dose-optimization program which includes automated exposure control, adjustment of the mA and/or kV according to patient size and/or use of iterative reconstruction technique. COMPARISON:  CT AP 09/16/21 FINDINGS: Lower chest: Lung bases are clear. Note the lack of IV contrast limits the ability to assess the abdominal and pelvic solid organs. Hepatobiliary: Liver has a normal contour. No focal liver lesions. No evidence of intra or extrahepatic biliary ductal dilatation. Gallbladder is surgically absent. Pancreas: No evidence of peripancreatic fat stranding to suggest pancreatitis. Spleen: Normal in size without focal abnormality. Adrenals/Urinary Tract: Bilateral adrenal glands are normal in size without focal lesions. Bilateral kidneys are symmetric in size without evidence of hydronephrosis or nephrolithiasis. No focal renal lesions are visualized. Urinary bladder is fluid-filled without focal wall thickening. Stomach/Bowel: Stomach, small bowel, large bowel are normal in caliber. There is diverticulosis without evidence of diverticulitis. The appendix is normal in appearance. Vascular/Lymphatic: No significant vascular findings are present. No enlarged abdominal or pelvic lymph nodes. Reproductive: Uterus and bilateral adnexa are unremarkable. Other: No abdominal wall hernia or abnormality. No abdominopelvic ascites. Musculoskeletal: Postsurgical changes from L4-L5 posterior spinal fusion. No evidence of hardware complications. Acute osseous abnormality. IMPRESSION: No acute abnormality.  No finding to explain abdominal pain. Electronically Signed   By: Lorenza Cambridge M.D.    On: 07/10/2022 14:46    Microbiology: Results for orders placed or performed during the hospital encounter of 07/10/22  Resp panel by RT-PCR (RSV, Flu A&B, Covid) Urine, Clean Catch     Status: None   Collection Time: 07/10/22  1:39 PM   Specimen: Urine, Clean Catch; Nasal Swab  Result Value Ref Range Status   SARS Coronavirus 2 by RT PCR NEGATIVE NEGATIVE Final    Comment: (NOTE) SARS-CoV-2 target nucleic acids are NOT DETECTED.  The SARS-CoV-2  RNA is generally detectable in upper respiratory specimens during the acute phase of infection. The lowest concentration of SARS-CoV-2 viral copies this assay can detect is 138 copies/mL. A negative result does not preclude SARS-Cov-2 infection and should not be used as the sole basis for treatment or other patient management decisions. A negative result may occur with  improper specimen collection/handling, submission of specimen other than nasopharyngeal swab, presence of viral mutation(s) within the areas targeted by this assay, and inadequate number of viral copies(<138 copies/mL). A negative result must be combined with clinical observations, patient history, and epidemiological information. The expected result is Negative.  Fact Sheet for Patients:  BloggerCourse.com  Fact Sheet for Healthcare Providers:  SeriousBroker.it  This test is no t yet approved or cleared by the [place] FDA and  has been authorized for detection and/or diagnosis of SARS-CoV-2 by FDA under an Emergency Use Authorization (EUA). This EUA will remain  in effect (meaning this test can be used) for the duration of the COVID-19 declaration under Section 564(b)(1) of the Act, 21 U.S.C.section 360bbb-3(b)(1), unless the authorization is terminated  or revoked sooner.       Influenza A by PCR NEGATIVE NEGATIVE Final   Influenza B by PCR NEGATIVE NEGATIVE Final    Comment: (NOTE) The Xpert Xpress  SARS-CoV-2/FLU/RSV plus assay is intended as an aid in the diagnosis of influenza from Nasopharyngeal swab specimens and should not be used as a sole basis for treatment. Nasal washings and aspirates are unacceptable for Xpert Xpress SARS-CoV-2/FLU/RSV testing.  Fact Sheet for Patients: BloggerCourse.com  Fact Sheet for Healthcare Providers: SeriousBroker.it  This test is not yet approved or cleared by the [place] FDA and has been authorized for detection and/or diagnosis of SARS-CoV-2 by FDA under an Emergency Use Authorization (EUA). This EUA will remain in effect (meaning this test can be used) for the duration of the COVID-19 declaration under Section 564(b)(1) of the Act, 21 U.S.C. section 360bbb-3(b)(1), unless the authorization is terminated or revoked.     Resp Syncytial Virus by PCR NEGATIVE NEGATIVE Final    Comment: (NOTE) Fact Sheet for Patients: BloggerCourse.com  Fact Sheet for Healthcare Providers: SeriousBroker.it  This test is not yet approved or cleared by the [place] FDA and has been authorized for detection and/or diagnosis of SARS-CoV-2 by FDA under an Emergency Use Authorization (EUA). This EUA will remain in effect (meaning this test can be used) for the duration of the COVID-19 declaration under Section 564(b)(1) of the Act, 21 U.S.C. section 360bbb-3(b)(1), unless the authorization is terminated or revoked.  Performed at [facility], [address] [place], [place] [address]     Labs: CBC: Recent Labs  Lab 07/15/22 [phone] 07/16/22 0257 07/17/22 0336 07/18/22 0629 07/19/22 0643  WBC 16.1* 12.2* 12.1* 15.0* 19.1*  NEUTROABS 14.2* 10.2* 11.7* 10.7* PENDING  HGB 8.9* 8.5* 8.4* 8.5* 9.2*  HCT 26.9* 24.9* 26.3* 26.0* 29.0*  MCV 95.1 94.0 98.9 97.7 100.0  PLT 181 176 179 175 188   Basic Metabolic Panel: Recent Labs  Lab  07/14/22 0708 07/15/22 0332 07/16/22 0257 07/17/22 0336 07/18/22 0629 07/19/22 0643  NA 138 137 139 140 140 140  K 4.0 3.2* 3.9 3.5 2.6* 3.0*  CL 105 101 106 106 106 109  CO2 18* 20* 22 22 22 24   GLUCOSE 328* 323* 324* 296* 192* 90  BUN 38* 39* 47* 42* 36* 32*  CREATININE 2.64* 2.67* 2.48* 1.90* 1.79* 1.43*  CALCIUM 9.0 8.6*  8.0* 8.1* 7.9* 7.9*  MG 1.5* 2.0  --   --  1.8  --   PHOS 4.4 4.3  --   --  2.8  --    Liver Function Tests: Recent Labs  Lab 07/13/22 0446 07/14/22 0708 07/15/22 0332 07/16/22 0257 07/17/22 0336 07/18/22 0629  AST 94* 47* 34 40 29  --   ALT 179* 151* 132* 110* 102*  --   ALKPHOS 130* 98 89 78 71  --   BILITOT 0.9 0.7 0.6 0.6 0.7  --   PROT 5.5* 5.9* 6.0* 5.3* 5.3*  --   ALBUMIN 2.5* 2.8* 2.9* 2.7* 2.7* 2.7*   CBG: Recent Labs  Lab 07/18/22 1203 07/18/22 1529 07/18/22 2125 07/19/22 0626 07/19/22 1153  GLUCAP 142* 180* 160* 95 125*    Discharge time spent: 40 minutes.   Signed: Kathlen Mody, MD [facility] 07/19/2022

## 2022-07-28 LAB — MISC LABCORP TEST (SEND OUT): Labcorp test code: 520085

## 2022-07-30 ENCOUNTER — Emergency Department (HOSPITAL_BASED_OUTPATIENT_CLINIC_OR_DEPARTMENT_OTHER)
Admission: EM | Admit: 2022-07-30 | Discharge: 2022-07-30 | Disposition: A | Discharge status: Home / Self Care | Payer: Medicare HMO | Attending: Emergency Medicine | Admitting: Emergency Medicine

## 2022-07-30 ENCOUNTER — Other Ambulatory Visit: Payer: Self-pay

## 2022-07-30 ENCOUNTER — Encounter (HOSPITAL_BASED_OUTPATIENT_CLINIC_OR_DEPARTMENT_OTHER): Payer: Self-pay | Admitting: Emergency Medicine

## 2022-07-30 DIAGNOSIS — I10 Essential (primary) hypertension: Secondary | ICD-10-CM | POA: Diagnosis not present

## 2022-07-30 DIAGNOSIS — K649 Unspecified hemorrhoids: Secondary | ICD-10-CM | POA: Insufficient documentation

## 2022-07-30 DIAGNOSIS — Z91040 Latex allergy status: Secondary | ICD-10-CM | POA: Insufficient documentation

## 2022-07-30 DIAGNOSIS — Z794 Long term (current) use of insulin: Secondary | ICD-10-CM | POA: Insufficient documentation

## 2022-07-30 DIAGNOSIS — Z79899 Other long term (current) drug therapy: Secondary | ICD-10-CM | POA: Insufficient documentation

## 2022-07-30 DIAGNOSIS — E119 Type 2 diabetes mellitus without complications: Secondary | ICD-10-CM | POA: Insufficient documentation

## 2022-07-30 MED ORDER — HYDROCORTISONE (PERIANAL) 2.5 % EX CREA: 1.0000 | TOPICAL_CREAM | Freq: Two times a day (BID) | CUTANEOUS | 0 refills | Status: DC

## 2022-07-30 NOTE — ED Notes (Signed)
ED Provider at bedside. 

## 2022-07-30 NOTE — Discharge Instructions (Addendum)
Continue MiraLAX, Tucks pads.  I have prescribed you a steroid to help with pain and hemorrhoid.  Please follow-up with general surgery as sometimes these hemorrhoids may need to be removed.  Continue your narcotic pain medicine.

## 2022-07-30 NOTE — ED Triage Notes (Signed)
Pt reports hemorrhoids x 3d, has some bleeding with them

## 2022-07-30 NOTE — ED Provider Notes (Signed)
[facility] Provider Note   CSN: [id number] Arrival date & time: 07/30/22  1124     History  Chief Complaint  Patient presents with   Hemorrhoids    Teresa Lee is a 62 y.o. female.  Patient here with rectal pain.  History of hemorrhoids think she has the same.  She has been having some bleeding intermittently.  She has been using some Tucks pads and Preparation H and MiraLAX with some improvement.  History of diabetes, myasthenia gravis, fibromyalgia, high cholesterol hypertension.  She denies any abdominal pain, no fever or chills.  She does state that she is having soft bowel movements now after using some MiraLAX.  She does have chronic constipation.  The history is provided by the patient.       Home Medications Prior to Admission medications   Medication Sig Start Date End Date Taking? Authorizing Provider  hydrocortisone (ANUSOL-HC) 2.5 % rectal cream Place 1 Application rectally 2 (two) times daily. 07/30/22  Yes Ulrich Soules, DO  albuterol (PROVENTIL) (2.5 MG/3ML) 0.083% nebulizer solution Take 2.5 mg by nebulization every 6 (six) hours as needed for shortness of breath. 12/26/19   [provider]  albuterol (VENTOLIN HFA) 108 (90 Base) MCG/ACT inhaler 1 PUFF EVERY 6 HOURS AS NEEDED FOR WHEEZING Patient taking differently: Inhale 1 puff into the lungs every 6 (six) hours as needed for wheezing or shortness of breath. 09/05/19   [place], Betty G, MD  Blood Glucose Monitoring Suppl DEVI 1 each by Does not apply route in the morning, at noon, and at bedtime. May substitute to any manufacturer covered by patient's insurance. 07/19/22   Kathlen Mody, MD  Continuous Glucose Receiver (DEXCOM G7 RECEIVER) DEVI 1 Units by Does not apply route daily. 07/19/22   Kathlen Mody, MD  cyanocobalamin (,VITAMIN B-12,) 1000 MCG/ML injection Inject 1,000 mcg into the muscle See admin instructions. Inject 1,000 mcg into the muscle every  6-8 weeks    [provider]  desmopressin (DDAVP) 0.2 MG tablet Take 0.6 mg by mouth at bedtime. 08/22/21   [provider]  estradiol (ESTRACE) 2 MG tablet Take 2 mg by mouth at bedtime.    [provider]  Glucose Blood (BLOOD GLUCOSE TEST STRIPS) STRP 1 each by In Vitro route in the morning, at noon, and at bedtime. May substitute to any manufacturer covered by patient's insurance. 07/19/22 11/29/22  Kathlen Mody, MD  hydrALAZINE (APRESOLINE) 25 MG tablet Take 3 tablets (75 mg total) by mouth every 8 (eight) hours. 07/19/22   Kathlen Mody, MD  HYDROcodone-acetaminophen (NORCO) 10-325 MG tablet Take 1 tablet by mouth 5 (five) times daily as needed for moderate pain. 08/25/17   [provider]  insulin aspart (NOVOLOG) 100 UNIT/ML FlexPen CBG 70 - 120: 0 units  CBG 121 - 150: 2 units  CBG 151 - 200: 3 units  CBG 201 - 250: 5 units  CBG 251 - 300: 8 units  CBG 301 - 350: 11 units  CBG 351 - 400: 15 units 07/19/22   Kathlen Mody, MD  insulin glargine (LANTUS) 100 UNIT/ML Solostar Pen Inject 25 Units into the skin at bedtime. 07/19/22   Kathlen Mody, MD  Insulin Pen Needle (PEN NEEDLES 29GX1/2") 29G X MISC 100 Units by Does not apply route 3 (three) times daily before meals. 07/19/22   Kathlen Mody, MD  Insulin Pen Needle (PEN NEEDLES) 32G X 4 MM MISC 100 Units by Does not apply  route 3 (three) times daily before meals. 07/19/22   Kathlen Mody, MD  Lancet Device MISC 1 each by Does not apply route in the morning, at noon, and at bedtime. May substitute to any manufacturer covered by patient's insurance. 07/19/22 08/18/22  Kathlen Mody, MD  Lancets Misc. MISC 1 each by Does not apply route in the morning, at noon, and at bedtime. May substitute to any manufacturer covered by patient's insurance. 07/19/22 08/18/22  Kathlen Mody, MD  loperamide (IMODIUM) 2 MG capsule Take 2 capsules (4 mg total) by mouth as needed for diarrhea or loose stools. Patient not taking: Reported on  07/10/2022 09/19/21   Glade Lloyd, MD  losartan (COZAAR) 50 MG tablet Take 50 mg by mouth daily. 09/30/21   [provider]  Mesalamine (ASACOL) 400 MG CPDR DR capsule Take 800 mg by mouth in the morning and at bedtime. 10/10/21   [provider]  methocarbamol (ROBAXIN) 750 MG tablet Take 750 mg by mouth at bedtime. 02/07/19   [provider]  metoprolol succinate (TOPROL-XL) 25 MG 24 hr tablet Take 25 mg by mouth at bedtime. 09/09/21   [provider]  mycophenolate (CELLCEPT) 500 MG tablet Take 1,000 mg by mouth in the morning and at bedtime.    [provider]  naloxone [facility]) nasal spray 4 mg/0.1 mL Place 1 spray into the nose once as needed (opioid overdose).    [provider]  ondansetron (ZOFRAN) 4 MG tablet Take 4 mg by mouth every 8 (eight) hours as needed for nausea or vomiting.    [provider]  pantoprazole (PROTONIX) 40 MG tablet TAKE 1 TABLET DAILY Patient taking differently: Take 40 mg by mouth daily before breakfast. 10/15/19   [place], Betty G, MD  potassium chloride SA (KLOR-CON M) 20 MEQ tablet Take 20 mEq by mouth at bedtime. 06/15/21   [provider]  predniSONE (DELTASONE) 10 MG tablet Prednisone 50 mg daily for 3 days followed by  Prednisone 40 mg daily for 5 days followed by  Prednisone 20 mg daily for 5 days followed by  Prednisone 10 mg daily 07/19/22   Kathlen Mody, MD  pregabalin (LYRICA) 75 MG capsule Take 75 mg by mouth at bedtime.    [provider]  progesterone (PROMETRIUM) 100 MG capsule Take 100 mg by mouth at bedtime.    [provider]  pyridostigmine (MESTINON) 60 MG tablet Take 30 mg by mouth 3 (three) times daily as needed (for Myasthenia gravis symtoms).    [provider]  rOPINIRole (REQUIP) 1 MG tablet Take 1-2 tablets (1-2 mg total) by mouth at bedtime. Patient taking differently: Take 1 mg by mouth See admin instructions. Take 1 tablet (1 mg) by mouth  daily at bedtime and may take an additional 1 mg later if still needed for restless legs 08/08/19   [place], Betty G, MD  Spacer/Aero-Holding Chambers (AEROCHAMBER PLUS) inhaler Use as instructed to use with inahaler. 08/30/18   [place], Betty G, MD  SYMBICORT 80-4.5 MCG/ACT inhaler Inhale 2 puffs into the lungs 2 (two) times daily. Patient taking differently: Inhale 2 puffs into the lungs 2 (two) times daily as needed (shortness of breath). 09/05/19   [place], Betty G, MD  Vitamin D, Ergocalciferol, (DRISDOL) 1.25 MG (50000 UNIT) CAPS capsule Take 50,000 Units by mouth every 14 (fourteen) days.    [provider]      Allergies    Duloxetine, Latex, Lorazepam, Orange concentrate [flavoring agent], Other, Percocet [oxycodone-acetaminophen], Sulfa  antibiotics, Statins, Tape, Bioflavonoids, Cefixime, and Nucynta [tapentadol]    Review of Systems   Review of Systems  Physical Exam Updated Vital Signs BP (!) 158/88 (BP Location: Left Arm)   Pulse (!) 103   Temp 97.7 F (36.5 C) (Oral)   Resp 16   Ht 5' (1.524 m)   Wt 71.2 kg   SpO2 97%   BMI 30.66 kg/m  Physical Exam Vitals and nursing note reviewed. Exam conducted with a chaperone present.  Constitutional:      General: She is not in acute distress.    Appearance: She is well-developed.  HENT:     Head: Normocephalic and atraumatic.  Eyes:     Conjunctiva/sclera: Conjunctivae normal.  Cardiovascular:     Rate and Rhythm: Normal rate and regular rhythm.     Heart sounds: No murmur heard. Pulmonary:     Effort: Pulmonary effort is normal. No respiratory distress.     Breath sounds: Normal breath sounds.  Abdominal:     Palpations: Abdomen is soft.     Tenderness: There is no abdominal tenderness.  Genitourinary:    Comments: Patient does appear to have a fairly large hemorrhoid on exam that is nonbleeding, there is no perirectal fluctuance and no signs of abscess Musculoskeletal:        General: No swelling.     Cervical  back: Neck supple.  Skin:    General: Skin is warm and dry.     Capillary Refill: Capillary refill takes less than 2 seconds.  Neurological:     Mental Status: She is alert.  Psychiatric:        Mood and Affect: Mood normal.     ED Results / Procedures / Treatments   Labs (all labs ordered are listed, but only abnormal results are displayed) Labs Reviewed - No data to display  EKG None  Radiology No results found.  Procedures Procedures    Medications Ordered in ED Medications - No data to display  ED Course/ Medical Decision Making/ A&P                             Medical Decision Making Risk Prescription drug management.   Bryssa Birdsong is here with rectal pain.  Normal vitals.  No fever.  With chaperone present does appear that she has a large hemorrhoid.  No concern for infectious process.  Will prescribe Anusol.  She is already on chronic narcotics.  She has been using Tucks pads and started to use MiraLAX now.  Recommend that she continue to aggressively use MiraLAX to make sure she is not straining.  Will have her follow-up with general surgery as may need hemorrhoids removed if they do not improve.  Discharged in good condition.  Understands return precautions.  This chart was dictated using voice recognition software.  Despite best efforts to proofread,  errors can occur which can change the documentation meaning.         Final Clinical Impression(s) / ED Diagnoses Final diagnoses:  Hemorrhoids, unspecified hemorrhoid type    Rx / DC Orders ED Discharge Orders          Ordered    hydrocortisone (ANUSOL-HC) 2.5 % rectal cream  2 times daily        07/30/22 1143              Sanchez Hemmer, Madelaine Bhat, DO 07/30/22 1155

## 2022-08-01 ENCOUNTER — Encounter (HOSPITAL_COMMUNITY): Payer: Self-pay

## 2022-08-01 LAB — SURGICAL PATHOLOGY

## 2023-04-12 ENCOUNTER — Ambulatory Visit: Payer: Medicare HMO | Attending: Nurse Practitioner

## 2023-04-12 ENCOUNTER — Other Ambulatory Visit: Payer: Self-pay

## 2023-04-12 DIAGNOSIS — M62838 Other muscle spasm: Secondary | ICD-10-CM | POA: Diagnosis present

## 2023-04-12 DIAGNOSIS — M542 Cervicalgia: Secondary | ICD-10-CM | POA: Diagnosis present

## 2023-04-12 NOTE — Therapy (Signed)
OUTPATIENT PHYSICAL THERAPY CERVICAL EVALUATION   Patient Name: Teresa Lee MRN: [id number] DOB:1961-03-05, 63 y.o., female Today's Date: 04/12/2023  END OF SESSION:  PT End of Session - 04/12/23 1255     Visit Number 1    Number of Visits 8    Date for PT Re-Evaluation 06/15/23    PT Start Time 1301    PT Stop Time 1335    PT Time Calculation (min) 34 min    Activity Tolerance Patient tolerated treatment well    Behavior During Therapy WFL for tasks assessed/performed             Past Medical History:  Diagnosis Date   Anxiety    Arthritis    osteoarthritis   Asthma    Blind left eye    Cancer (HCC)    skin   Crohn's disease (HCC)    CTS (carpal tunnel syndrome)    DJD (degenerative joint disease)    Both cervical spine and LS spine   Fibromyalgia    GERD (gastroesophageal reflux disease)    Heart murmur    Herniated nucleus pulposus    Hyperlipidemia    Hypertension    IBS (irritable bowel syndrome)    Leukocytosis    Myasthenia gravis    Myasthenia gravis (HCC)    Myasthenia gravis (HCC)    Non-insulin dependent type 2 diabetes mellitus (HCC) 07/13/2022   Pneumonia    Sleep apnea    Spinal cord stimulator status    Past Surgical History:  Procedure Laterality Date   BACK SURGERY     BIOPSY  05/24/2020   Procedure: BIOPSY;  Surgeon: Bernette Redbird, MD;  Location: [facility] ENDOSCOPY;  Service: Endoscopy;;   BREAST REDUCTION SURGERY     CARPAL TUNNEL RELEASE     bilateral   cervical disc inj     CHOLECYSTECTOMY     COLONOSCOPY WITH PROPOFOL N/A 05/24/2020   Procedure: COLONOSCOPY WITH PROPOFOL;  Surgeon: Bernette Redbird, MD;  Location: [facility] ENDOSCOPY;  Service: Endoscopy;  Laterality: N/A;   ENDOMETRIAL ABLATION W/ NOVASURE     ESOPHAGOGASTRODUODENOSCOPY (EGD) WITH PROPOFOL N/A 05/24/2020   Procedure: ESOPHAGOGASTRODUODENOSCOPY (EGD) WITH PROPOFOL;  Surgeon: Bernette Redbird, MD;  Location: [facility] ENDOSCOPY;  Service: Endoscopy;  Laterality: N/A;   LUMBAR  LAMINECTOMY/DECOMPRESSION MICRODISCECTOMY Right 05/04/2020   Procedure: Right Lumbar Five Sacral One Microdiscectomy;  Surgeon: Maeola Harman, MD;  Location: [facility] OR;  Service: Neurosurgery;  Laterality: Right;  posterior   MELANOMA EXCISION     MUSCLE BIOPSY Left 07/17/2022   Procedure: LEFT THIGH MUSCLE BIOPSY;  Surgeon: Axel Filler, MD;  Location: [facility] OR;  Service: General;  Laterality: Left;   NASAL SINUS SURGERY     SPINAL CORD STIMULATOR INSERTION  2019   SPINAL CORD STIMULATOR INSERTION N/A 10/18/2021   Procedure: REMOVAL OF  SPINAL CORD STIMULATOR;  Surgeon: Dawley, Alan Mulder, DO;  Location: [facility] OR;  Service: Neurosurgery;  Laterality: N/A;  3C   THYMECTOMY     due to myastenia gravis   THYMECTOMY     TRANSFORAMINAL LUMBAR INTERBODY FUSION (TLIF) WITH PEDICLE SCREW FIXATION 1 LEVEL Right 05/04/2020   Procedure: Right Lumbar Four-Five Transforaminal lumbar interbody fusion;  Surgeon: Maeola Harman, MD;  Location: [facility] OR;  Service: Neurosurgery;  Laterality: Right;  posterior   Patient Active Problem List   Diagnosis Date Noted   Non-insulin dependent type 2 diabetes mellitus (HCC) 07/13/2022   Rhabdomyolysis 07/10/2022   Acute gastroenteritis 09/17/2021   Acute on chronic kidney failure (  HCC) 09/16/2021   SIRS (systemic inflammatory response syndrome) (HCC) 06/03/2020   ARF (acute renal failure) (HCC) 05/22/2020   Diarrhea 05/22/2020   Nausea 05/22/2020   Spondylolisthesis of lumbar region 05/04/2020   Chronic pain disorder 08/12/2019   COVID-19    Acute on chronic renal failure (HCC) 02/16/2019   Insomnia disorder 02/27/2018   Fatigue 02/27/2018   Fibromyalgia syndrome 04/30/2017   RLS (restless legs syndrome) 12/26/2016   B12 deficiency 12/26/2016   Vitamin D deficiency, unspecified 12/26/2016   GERD (gastroesophageal reflux disease) 12/26/2016   Anxiety disorder 12/26/2016   Hyponatremia 10/24/2016   AKI (acute kidney injury) (HCC) 10/23/2016   Hypomagnesemia 10/22/2016    Hypophosphatemia 10/22/2016   Hypokalemia 10/21/2016   Wound infection after surgery 10/21/2016   Sepsis (HCC) 10/21/2016   Myasthenia gravis (HCC) 10/21/2016   Melanoma (HCC) 10/21/2016   Crohn disease (HCC) 10/21/2016   Cellulitis 10/21/2016   Palpitations 08/25/2013   Leukocytosis 05/25/2011   Hyperlipidemia 04/20/2007   Essential hypertension 04/20/2007   Asthma 04/20/2007   CARPAL TUNNEL SYNDROME, HX OF 04/20/2007   REFERRING PROVIDER: Janeece Riggers, FNP   REFERRING DIAG: Neck pain   THERAPY DIAG:  Cervicalgia  Rationale for Evaluation and Treatment: Rehabilitation  ONSET DATE: years ago  SUBJECTIVE:                                                                                                                                                                                                         SUBJECTIVE STATEMENT: Patient reports that she has always had neck pain, but she has begun to have tingling into her face and left shoulder in recent weeks. Her pain and tingling is primarily located on her left side, but she will have some pain her right side occasionally. She did take a pain pill about 30 minutes prior to her appointment due to her pain this morning.  Hand dominance: Right  PERTINENT HISTORY:  Allergies, hypertension, asthma, diabetes type 2, myasthenia gravis, chronic renal failure, anxiety, fibromyalgia, history of COVID-19, and arthritis  PAIN:  Are you having pain? Yes: NPRS scale: Current: 0/10 Best: 0/10 Worst: 10/10 Pain location: left side of face, neck, and shoulder Pain description: numbness, tingling, aching, and soreness Aggravating factors: turning her head, laying on 2 pillows Relieving factors: changing positions, heat, and rest  PRECAUTIONS: None  RED FLAGS: None     WEIGHT BEARING RESTRICTIONS: No  FALLS:  Has patient fallen in last 6 months?  No, "but I am always unsteady"  LIVING ENVIRONMENT: Lives with: lives with their  spouse Lives in: House/apartment Has following equipment at home: None  OCCUPATION: N/a  PLOF: Independent  PATIENT GOALS: reduced pain, numbness, and tingling, be able to go out more, improved sleep (she has difficulty finding a comfortable place to sleep)  NEXT MD VISIT: 04/26/23  OBJECTIVE:  Note: Objective measures were completed at Evaluation unless otherwise noted.  COGNITION: Overall cognitive status: Within functional limits for tasks assessed  SENSATION: Patient reports intermittent numbness and tingling along the left side of her face, but none currently.   POSTURE: rounded shoulders and forward head  PALPATION: TTP: bilateral scapular stabilizers and left levator scapulae   JOINT MOBILITY:  C3-6: WFL and nonpainful   C7-T1: hypomobile and painful   CERVICAL ROM:   Active ROM A/PROM (deg) eval  Flexion 28  Extension 52  Right lateral flexion 30; reproduced familiar pain   Left lateral flexion 30; reproduced familiar pain (left > right)  Right rotation 50  Left rotation 46   (Blank rows = not tested)  UPPER EXTREMITY ROM: WFL for activities assessed  UPPER EXTREMITY MMT:  MMT Right eval Left eval  Shoulder flexion 4-/5 4-/5  Shoulder extension    Shoulder abduction 4-/5 4-/5  Shoulder adduction    Shoulder extension    Shoulder internal rotation    Shoulder external rotation    Middle trapezius    Lower trapezius    Elbow flexion 5/5 4/5; sore   Elbow extension 5/5 5/5  Wrist flexion    Wrist extension    Wrist ulnar deviation    Wrist radial deviation    Wrist pronation    Wrist supination    Grip strength 15 20   (Blank rows = not tested)  CERVICAL SPECIAL TESTS:  Spurling's test: Negative and Sharp pursor's test: Negative  TREATMENT DATE:                                                                                                                                  PATIENT EDUCATION:  Education details: plan of care, prognosis,  healing, and goals for physical therapy Person educated: Patient Education method: Explanation Education comprehension: verbalized understanding  HOME EXERCISE PROGRAM:   ASSESSMENT:  CLINICAL IMPRESSION: Patient is a 63 y.o. female who was seen today for physical therapy evaluation and treatment for chronic left sided cervical pain. She presented with low pain severity and irritability with cervical side bending being the most aggravating to her familiar symptoms. She also exhibited reduced cervical rotation bilaterally. Recommend that she continue with skilled physical therapy to address her impairments to maximize her functional mobility.   OBJECTIVE IMPAIRMENTS: decreased ROM, decreased strength, hypomobility, impaired tone, postural dysfunction, and pain.   ACTIVITY LIMITATIONS: sleeping  PARTICIPATION LIMITATIONS: cleaning  PERSONAL FACTORS: Past/current experiences, Time since onset of injury/illness/exacerbation, and 3+ comorbidities: Allergies, hypertension, asthma, diabetes type 2, myasthenia gravis, chronic renal failure, anxiety, fibromyalgia, history of COVID-19, and arthritis  are also affecting  patient's functional outcome.   REHAB POTENTIAL: Fair    CLINICAL DECISION MAKING: Evolving/moderate complexity  EVALUATION COMPLEXITY: Moderate   GOALS: Goals reviewed with patient? Yes  LONG TERM GOALS: Target date: 05/10/23  Patient will be independent with her HEP.  Baseline:  Goal status: INITIAL  2.  Patient will be able to complete her daily activities without her familiar symptoms exceeding 8/10. Baseline:  Goal status: INITIAL  3.  Patient will be able to demonstrate at least 60 degrees of cervical rotation bilaterally for improved awareness of her surroundings.  Baseline:  Goal status: INITIAL  4.  Patient will report being able to sleep without being limited by her familiar symptoms.  Baseline:  Goal status: INITIAL  PLAN:  PT FREQUENCY: 2x/week  PT  DURATION: 4 weeks  PLANNED INTERVENTIONS: 97164- PT Re-evaluation, 97110-Therapeutic exercises, 97530- Therapeutic activity, 97112- Neuromuscular re-education, 97535- Self Care, [id number]- Manual therapy, 97014- Electrical stimulation (unattended), 97035- Ultrasound, 91478- Traction (mechanical), Patient/Family education, Dry Needling, Joint mobilization, Spinal mobilization, Cryotherapy, and Moist heat  PLAN FOR NEXT SESSION: UBE, chin tucks, manual therapy, postural reeducation, and modalities as needed   Granville Lewis, PT 04/12/2023, 3:02 PM

## 2023-04-16 ENCOUNTER — Ambulatory Visit: Payer: Medicare HMO | Admitting: Physical Therapy

## 2023-04-16 DIAGNOSIS — M542 Cervicalgia: Secondary | ICD-10-CM | POA: Diagnosis not present

## 2023-04-16 DIAGNOSIS — M62838 Other muscle spasm: Secondary | ICD-10-CM

## 2023-04-16 NOTE — Therapy (Signed)
OUTPATIENT PHYSICAL THERAPY CERVICAL TREATMENT Patient Name: Teresa Lee MRN: [id number] DOB:[birth year], 63 y.o., female Today's Date: 04/16/2023  END OF SESSION:  PT End of Session - 04/16/23 1344     Visit Number 2    Date for PT Re-Evaluation 06/15/23    PT Start Time 0101    PT Stop Time 0150    PT Time Calculation (min) 49 min    Activity Tolerance Patient tolerated treatment well    Behavior During Therapy [facility] for tasks assessed/performed             Past Medical History:  Diagnosis Date   Anxiety    Arthritis    osteoarthritis   Asthma    Blind left eye    Cancer (HCC)    skin   Crohn's disease (HCC)    CTS (carpal tunnel syndrome)    DJD (degenerative joint disease)    Both cervical spine and LS spine   Fibromyalgia    GERD (gastroesophageal reflux disease)    Heart murmur    Herniated nucleus pulposus    Hyperlipidemia    Hypertension    IBS (irritable bowel syndrome)    Leukocytosis    Myasthenia gravis    Myasthenia gravis (HCC)    Myasthenia gravis (HCC)    Non-insulin dependent type 2 diabetes mellitus (HCC) 07/13/2022   Pneumonia    Sleep apnea    Spinal cord stimulator status    Past Surgical History:  Procedure Laterality Date   BACK SURGERY     BIOPSY  05/24/2020   Procedure: BIOPSY;  Surgeon: Bernette Redbird, MD;  Location: [facility] ENDOSCOPY;  Service: Endoscopy;;   BREAST REDUCTION SURGERY     CARPAL TUNNEL RELEASE     bilateral   cervical disc inj     CHOLECYSTECTOMY     COLONOSCOPY WITH PROPOFOL N/A 05/24/2020   Procedure: COLONOSCOPY WITH PROPOFOL;  Surgeon: Bernette Redbird, MD;  Location: [facility] ENDOSCOPY;  Service: Endoscopy;  Laterality: N/A;   ENDOMETRIAL ABLATION W/ NOVASURE     ESOPHAGOGASTRODUODENOSCOPY (EGD) WITH PROPOFOL N/A 05/24/2020   Procedure: ESOPHAGOGASTRODUODENOSCOPY (EGD) WITH PROPOFOL;  Surgeon: Bernette Redbird, MD;  Location: [facility] ENDOSCOPY;  Service: Endoscopy;  Laterality: N/A;   LUMBAR LAMINECTOMY/DECOMPRESSION  MICRODISCECTOMY Right 05/04/2020   Procedure: Right Lumbar Five Sacral One Microdiscectomy;  Surgeon: Maeola Harman, MD;  Location: [facility] OR;  Service: Neurosurgery;  Laterality: Right;  posterior   MELANOMA EXCISION     MUSCLE BIOPSY Left 07/17/2022   Procedure: LEFT THIGH MUSCLE BIOPSY;  Surgeon: Axel Filler, MD;  Location: [facility] OR;  Service: General;  Laterality: Left;   NASAL SINUS SURGERY     SPINAL CORD STIMULATOR INSERTION  2019   SPINAL CORD STIMULATOR INSERTION N/A 10/18/2021   Procedure: REMOVAL OF  SPINAL CORD STIMULATOR;  Surgeon: Dawley, Alan Mulder, DO;  Location: [facility] OR;  Service: Neurosurgery;  Laterality: N/A;  3C   THYMECTOMY     due to myastenia gravis   THYMECTOMY     TRANSFORAMINAL LUMBAR INTERBODY FUSION (TLIF) WITH PEDICLE SCREW FIXATION 1 LEVEL Right 05/04/2020   Procedure: Right Lumbar Four-Five Transforaminal lumbar interbody fusion;  Surgeon: Maeola Harman, MD;  Location: [facility] OR;  Service: Neurosurgery;  Laterality: Right;  posterior   Patient Active Problem List   Diagnosis Date Noted   Non-insulin dependent type 2 diabetes mellitus (HCC) 07/13/2022   Rhabdomyolysis 07/10/2022   Acute gastroenteritis 09/17/2021   Acute on chronic kidney failure (HCC) 09/16/2021   SIRS (systemic inflammatory response syndrome) (  HCC) 06/03/2020   ARF (acute renal failure) (HCC) 05/22/2020   Diarrhea 05/22/2020   Nausea 05/22/2020   Spondylolisthesis of lumbar region 05/04/2020   Chronic pain disorder 08/12/2019   COVID-19    Acute on chronic renal failure (HCC) 02/16/2019   Insomnia disorder 02/27/2018   Fatigue 02/27/2018   Fibromyalgia syndrome 04/30/2017   RLS (restless legs syndrome) 12/26/2016   B12 deficiency 12/26/2016   Vitamin D deficiency, unspecified 12/26/2016   GERD (gastroesophageal reflux disease) 12/26/2016   Anxiety disorder 12/26/2016   Hyponatremia 10/24/2016   AKI (acute kidney injury) (HCC) 10/23/2016   Hypomagnesemia 10/22/2016   Hypophosphatemia 10/22/2016    Hypokalemia 10/21/2016   Wound infection after surgery 10/21/2016   Sepsis (HCC) 10/21/2016   Myasthenia gravis (HCC) 10/21/2016   Melanoma (HCC) 10/21/2016   Crohn disease (HCC) 10/21/2016   Cellulitis 10/21/2016   Palpitations 08/25/2013   Leukocytosis 05/25/2011   Hyperlipidemia 04/20/2007   Essential hypertension 04/20/2007   Asthma 04/20/2007   CARPAL TUNNEL SYNDROME, HX OF 04/20/2007   REFERRING PROVIDER: Janeece Riggers, FNP   REFERRING DIAG: Neck pain   THERAPY DIAG:  No diagnosis found.  Rationale for Evaluation and Treatment: Rehabilitation  ONSET DATE: years ago  SUBJECTIVE:                                                                                                                                                                                                         SUBJECTIVE STATEMENT: Pian about a 5 today.    PERTINENT HISTORY:  Allergies, hypertension, asthma, diabetes type 2, myasthenia gravis, chronic renal failure, anxiety, fibromyalgia, history of COVID-19, and arthritis  PAIN:  Are you having pain? Yes: NPRS scale: Current: 5/10 Best: 0/10 Worst: 10/10 Pain location: left side of face, neck, and shoulder Pain description: numbness, tingling, aching, and soreness Aggravating factors: turning her head, laying on 2 pillows Relieving factors: changing positions, heat, and rest  PRECAUTIONS: None  RED FLAGS: None     WEIGHT BEARING RESTRICTIONS: No  FALLS:  Has patient fallen in last 6 months?  No, "but I am always unsteady"  LIVING ENVIRONMENT: Lives with: lives with their spouse Lives in: House/apartment Has following equipment at home: None  OCCUPATION: N/a  PLOF: Independent  PATIENT GOALS: reduced pain, numbness, and tingling, be able to go out more, improved sleep (she has difficulty finding a comfortable place to sleep)  NEXT MD VISIT: 04/26/23  OBJECTIVE:  Note: Objective measures were completed at Evaluation unless  otherwise noted.  COGNITION: Overall cognitive status: Within functional limits for  tasks assessed  SENSATION: Patient reports intermittent numbness and tingling along the left side of her face, but none currently.   POSTURE: rounded shoulders and forward head  PALPATION: TTP: bilateral scapular stabilizers and left levator scapulae   JOINT MOBILITY:  C3-6: WFL and nonpainful   C7-T1: hypomobile and painful   CERVICAL ROM:   Active ROM A/PROM (deg) eval  Flexion 28  Extension 52  Right lateral flexion 30; reproduced familiar pain   Left lateral flexion 30; reproduced familiar pain (left > right)  Right rotation 50  Left rotation 46   (Blank rows = not tested)  UPPER EXTREMITY ROM: WFL for activities assessed  UPPER EXTREMITY MMT:  MMT Right eval Left eval  Shoulder flexion 4-/5 4-/5  Shoulder extension    Shoulder abduction 4-/5 4-/5  Shoulder adduction    Shoulder extension    Shoulder internal rotation    Shoulder external rotation    Middle trapezius    Lower trapezius    Elbow flexion 5/5 4/5; sore   Elbow extension 5/5 5/5  Wrist flexion    Wrist extension    Wrist ulnar deviation    Wrist radial deviation    Wrist pronation    Wrist supination    Grip strength 15 20   (Blank rows = not tested)  CERVICAL SPECIAL TESTS:  Spurling's test: Negative and Sharp pursor's test: Negative  TREATMENT DATE:                                                                                                                                  PATIENT EDUCATION:  Education details: plan of care, prognosis, healing, and goals for physical therapy Person educated: Patient Education method: Explanation Education comprehension: verbalized understanding  HOME EXERCISE PROGRAM:   TODAY's TX:  04/16/23:  Combo e'stim/US at 1.50 W/CM2 x 12 minutes f/b STW/M x 11 minutes with ischemic release technique utilized to left UT, levator scap and and medial scapular  musculature f/b HMP and IFC at 80-150 Hz on 40% scan x 20 minutes.  Normal modality response following removal of modality.   ASSESSMENT:  CLINICAL IMPRESSION: The patient did very well with treatment today.  She exhibited increased tone over her left UT, lev scap and was very tender to palpation over her medial scapular border region.  We discussed carrying purse over right shoulder and not of left.  She felt better after treatment    OBJECTIVE IMPAIRMENTS: decreased ROM, decreased strength, hypomobility, impaired tone, postural dysfunction, and pain.   ACTIVITY LIMITATIONS: sleeping  PARTICIPATION LIMITATIONS: cleaning  PERSONAL FACTORS: Past/current experiences, Time since onset of injury/illness/exacerbation, and 3+ comorbidities: Allergies, hypertension, asthma, diabetes type 2, myasthenia gravis, chronic renal failure, anxiety, fibromyalgia, history of COVID-19, and arthritis  are also affecting patient's functional outcome.   REHAB POTENTIAL: Fair    CLINICAL DECISION MAKING: Evolving/moderate complexity  EVALUATION COMPLEXITY: Moderate   GOALS: Goals reviewed with patient? Yes  LONG TERM GOALS: Target date: 05/10/23  Patient will be independent with her HEP.  Baseline:  Goal status: INITIAL  2.  Patient will be able to complete her daily activities without her familiar symptoms exceeding 8/10. Baseline:  Goal status: INITIAL  3.  Patient will be able to demonstrate at least 60 degrees of cervical rotation bilaterally for improved awareness of her surroundings.  Baseline:  Goal status: INITIAL  4.  Patient will report being able to sleep without being limited by her familiar symptoms.  Baseline:  Goal status: INITIAL  PLAN:  PT FREQUENCY: 2x/week  PT DURATION: 4 weeks  PLANNED INTERVENTIONS: 97164- PT Re-evaluation, 97110-Therapeutic exercises, 97530- Therapeutic activity, 97112- Neuromuscular re-education, 97535- Self Care, 16109- Manual therapy, 97014-  Electrical stimulation (unattended), 97035- Ultrasound, 60454- Traction (mechanical), Patient/Family education, Dry Needling, Joint mobilization, Spinal mobilization, Cryotherapy, and Moist heat  PLAN FOR NEXT SESSION: UBE, chin tucks, manual therapy, postural reeducation, and modalities as needed   Taurus Alamo, [place], PT 04/16/2023, 1:55 PM

## 2023-04-18 ENCOUNTER — Ambulatory Visit: Payer: Medicare HMO

## 2023-04-18 DIAGNOSIS — M542 Cervicalgia: Secondary | ICD-10-CM | POA: Diagnosis not present

## 2023-04-18 NOTE — Therapy (Signed)
OUTPATIENT PHYSICAL THERAPY CERVICAL TREATMENT  Patient Name: Teresa Lee MRN: [id number] DOB:1960-12-27, 63 y.o., female Today's Date: 04/18/2023  END OF SESSION:  PT End of Session - 04/18/23 1213     Visit Number 3    Number of Visits 8    Date for PT Re-Evaluation 06/15/23    PT Start Time 1100    PT Stop Time 1147    PT Time Calculation (min) 47 min    Activity Tolerance Patient tolerated treatment well    Behavior During Therapy WFL for tasks assessed/performed              Past Medical History:  Diagnosis Date   Anxiety    Arthritis    osteoarthritis   Asthma    Blind left eye    Cancer (HCC)    skin   Crohn's disease (HCC)    CTS (carpal tunnel syndrome)    DJD (degenerative joint disease)    Both cervical spine and LS spine   Fibromyalgia    GERD (gastroesophageal reflux disease)    Heart murmur    Herniated nucleus pulposus    Hyperlipidemia    Hypertension    IBS (irritable bowel syndrome)    Leukocytosis    Myasthenia gravis    Myasthenia gravis (HCC)    Myasthenia gravis (HCC)    Non-insulin dependent type 2 diabetes mellitus (HCC) 07/13/2022   Pneumonia    Sleep apnea    Spinal cord stimulator status    Past Surgical History:  Procedure Laterality Date   BACK SURGERY     BIOPSY  05/24/2020   Procedure: BIOPSY;  Surgeon: Bernette Redbird, MD;  Location: [facility] ENDOSCOPY;  Service: Endoscopy;;   BREAST REDUCTION SURGERY     CARPAL TUNNEL RELEASE     bilateral   cervical disc inj     CHOLECYSTECTOMY     COLONOSCOPY WITH PROPOFOL N/A 05/24/2020   Procedure: COLONOSCOPY WITH PROPOFOL;  Surgeon: Bernette Redbird, MD;  Location: [facility] ENDOSCOPY;  Service: Endoscopy;  Laterality: N/A;   ENDOMETRIAL ABLATION W/ NOVASURE     ESOPHAGOGASTRODUODENOSCOPY (EGD) WITH PROPOFOL N/A 05/24/2020   Procedure: ESOPHAGOGASTRODUODENOSCOPY (EGD) WITH PROPOFOL;  Surgeon: Bernette Redbird, MD;  Location: [facility] ENDOSCOPY;  Service: Endoscopy;  Laterality: N/A;   LUMBAR  LAMINECTOMY/DECOMPRESSION MICRODISCECTOMY Right 05/04/2020   Procedure: Right Lumbar Five Sacral One Microdiscectomy;  Surgeon: Maeola Harman, MD;  Location: [facility] OR;  Service: Neurosurgery;  Laterality: Right;  posterior   MELANOMA EXCISION     MUSCLE BIOPSY Left 07/17/2022   Procedure: LEFT THIGH MUSCLE BIOPSY;  Surgeon: Axel Filler, MD;  Location: [facility] OR;  Service: General;  Laterality: Left;   NASAL SINUS SURGERY     SPINAL CORD STIMULATOR INSERTION  2019   SPINAL CORD STIMULATOR INSERTION N/A 10/18/2021   Procedure: REMOVAL OF  SPINAL CORD STIMULATOR;  Surgeon: Dawley, Alan Mulder, DO;  Location: [facility] OR;  Service: Neurosurgery;  Laterality: N/A;  3C   THYMECTOMY     due to myastenia gravis   THYMECTOMY     TRANSFORAMINAL LUMBAR INTERBODY FUSION (TLIF) WITH PEDICLE SCREW FIXATION 1 LEVEL Right 05/04/2020   Procedure: Right Lumbar Four-Five Transforaminal lumbar interbody fusion;  Surgeon: Maeola Harman, MD;  Location: [facility] OR;  Service: Neurosurgery;  Laterality: Right;  posterior   Patient Active Problem List   Diagnosis Date Noted   Non-insulin dependent type 2 diabetes mellitus (HCC) 07/13/2022   Rhabdomyolysis 07/10/2022   Acute gastroenteritis 09/17/2021   Acute on chronic kidney failure (  HCC) 09/16/2021   SIRS (systemic inflammatory response syndrome) (HCC) 06/03/2020   ARF (acute renal failure) (HCC) 05/22/2020   Diarrhea 05/22/2020   Nausea 05/22/2020   Spondylolisthesis of lumbar region 05/04/2020   Chronic pain disorder 08/12/2019   COVID-19    Acute on chronic renal failure (HCC) 02/16/2019   Insomnia disorder 02/27/2018   Fatigue 02/27/2018   Fibromyalgia syndrome 04/30/2017   RLS (restless legs syndrome) 12/26/2016   B12 deficiency 12/26/2016   Vitamin D deficiency, unspecified 12/26/2016   GERD (gastroesophageal reflux disease) 12/26/2016   Anxiety disorder 12/26/2016   Hyponatremia 10/24/2016   AKI (acute kidney injury) (HCC) 10/23/2016   Hypomagnesemia 10/22/2016    Hypophosphatemia 10/22/2016   Hypokalemia 10/21/2016   Wound infection after surgery 10/21/2016   Sepsis (HCC) 10/21/2016   Myasthenia gravis (HCC) 10/21/2016   Melanoma (HCC) 10/21/2016   Crohn disease (HCC) 10/21/2016   Cellulitis 10/21/2016   Palpitations 08/25/2013   Leukocytosis 05/25/2011   Hyperlipidemia 04/20/2007   Essential hypertension 04/20/2007   Asthma 04/20/2007   CARPAL TUNNEL SYNDROME, HX OF 04/20/2007   REFERRING PROVIDER: Janeece Riggers, FNP   REFERRING DIAG: Neck pain   THERAPY DIAG:  Cervicalgia  Rationale for Evaluation and Treatment: Rehabilitation  ONSET DATE: years ago  SUBJECTIVE:                                                                                                                                                                                                         SUBJECTIVE STATEMENT: Patient reports that she was hurting a lot more after her last appointment.   PERTINENT HISTORY:  Allergies, hypertension, asthma, diabetes type 2, myasthenia gravis, chronic renal failure, anxiety, fibromyalgia, history of COVID-19, and arthritis  PAIN:  Are you having pain? Yes: NPRS scale: Current: 6-7/10 Best: 0/10 Worst: 10/10 Pain location: left side of face, neck, and shoulder Pain description: numbness, tingling, aching, and soreness Aggravating factors: turning her head, laying on 2 pillows Relieving factors: changing positions, heat, and rest  PRECAUTIONS: None  RED FLAGS: None     WEIGHT BEARING RESTRICTIONS: No  FALLS:  Has patient fallen in last 6 months?  No, "but I am always unsteady"  LIVING ENVIRONMENT: Lives with: lives with their spouse Lives in: House/apartment Has following equipment at home: None  OCCUPATION: N/a  PLOF: Independent  PATIENT GOALS: reduced pain, numbness, and tingling, be able to go out more, improved sleep (she has difficulty finding a comfortable place to sleep)  NEXT MD VISIT:  04/26/23  OBJECTIVE:  Note: Objective measures were completed  at Evaluation unless otherwise noted.  COGNITION: Overall cognitive status: Within functional limits for tasks assessed  SENSATION: Patient reports intermittent numbness and tingling along the left side of her face, but none currently.   POSTURE: rounded shoulders and forward head  PALPATION: TTP: bilateral scapular stabilizers and left levator scapulae   JOINT MOBILITY:  C3-6: WFL and nonpainful   C7-T1: hypomobile and painful   CERVICAL ROM:   Active ROM A/PROM (deg) eval  Flexion 28  Extension 52  Right lateral flexion 30; reproduced familiar pain   Left lateral flexion 30; reproduced familiar pain (left > right)  Right rotation 50  Left rotation 46   (Blank rows = not tested)  UPPER EXTREMITY ROM: WFL for activities assessed  UPPER EXTREMITY MMT:  MMT Right eval Left eval  Shoulder flexion 4-/5 4-/5  Shoulder extension    Shoulder abduction 4-/5 4-/5  Shoulder adduction    Shoulder extension    Shoulder internal rotation    Shoulder external rotation    Middle trapezius    Lower trapezius    Elbow flexion 5/5 4/5; sore   Elbow extension 5/5 5/5  Wrist flexion    Wrist extension    Wrist ulnar deviation    Wrist radial deviation    Wrist pronation    Wrist supination    Grip strength 15 20   (Blank rows = not tested)  CERVICAL SPECIAL TESTS:  Spurling's test: Negative and Sharp pursor's test: Negative  TREATMENT DATE:                                                                                                                                                                 04/18/23 EXERCISE LOG  Exercise Repetitions and Resistance Comments  Scapular retraction  20 reps    Scapular circles  30 reps  CW and CCW               Blank cell = exercise not performed today  Manual Therapy Soft Tissue Mobilization: left upper trapezius, levator scapulae, and scapular stabilizers, for  reduced pain and tone   Modalities: no redness or adverse reaction to today's modalities  Date:  Unattended Estim: left upper trapezius and scapular stabilizers, IFC @ 80-150 Hz w/ 40% scan, 15 mins, Pain and Tone Hot Pack: Cervical, 15 mins, Pain and Tone   PATIENT EDUCATION:  Education details: plan of care, prognosis, healing, and goals for physical therapy Person educated: Patient Education method: Explanation Education comprehension: verbalized understanding  HOME EXERCISE PROGRAM:  ASSESSMENT:  CLINICAL IMPRESSION: Treatment was initiated with manual therapy with soft tissue mobilization to her left levator scapulae and scapular stabilizers being the most effective. This was followed by appropriately matched interventions for improved scapular mobility. She required moderate verbal and  tactile cueing with scapular retractions to isolate periscapular engagement and limit upper trapezius engagement. She experienced a mild reduction in her familiar symptoms with manual therapy and these interventions. She reported that she felt better upon the conclusion of treatment. She continues to require skilled physical therapy to address her remaining impairments to return to her prior level of function.   OBJECTIVE IMPAIRMENTS: decreased ROM, decreased strength, hypomobility, impaired tone, postural dysfunction, and pain.   ACTIVITY LIMITATIONS: sleeping  PARTICIPATION LIMITATIONS: cleaning  PERSONAL FACTORS: Past/current experiences, Time since onset of injury/illness/exacerbation, and 3+ comorbidities: Allergies, hypertension, asthma, diabetes type 2, myasthenia gravis, chronic renal failure, anxiety, fibromyalgia, history of COVID-19, and arthritis  are also affecting patient's functional outcome.   REHAB POTENTIAL: Fair    CLINICAL DECISION MAKING: Evolving/moderate complexity  EVALUATION COMPLEXITY: Moderate   GOALS: Goals reviewed with patient? Yes  LONG TERM GOALS: Target  date: 05/10/23  Patient will be independent with her HEP.  Baseline:  Goal status: INITIAL  2.  Patient will be able to complete her daily activities without her familiar symptoms exceeding 8/10. Baseline:  Goal status: INITIAL  3.  Patient will be able to demonstrate at least 60 degrees of cervical rotation bilaterally for improved awareness of her surroundings.  Baseline:  Goal status: INITIAL  4.  Patient will report being able to sleep without being limited by her familiar symptoms.  Baseline:  Goal status: INITIAL  PLAN:  PT FREQUENCY: 2x/week  PT DURATION: 4 weeks  PLANNED INTERVENTIONS: 97164- PT Re-evaluation, 97110-Therapeutic exercises, 97530- Therapeutic activity, 97112- Neuromuscular re-education, 97535- Self Care, 16109- Manual therapy, 97014- Electrical stimulation (unattended), 97035- Ultrasound, 60454- Traction (mechanical), Patient/Family education, Dry Needling, Joint mobilization, Spinal mobilization, Cryotherapy, and Moist heat  PLAN FOR NEXT SESSION: UBE, chin tucks, manual therapy, postural reeducation, and modalities as needed   Granville Lewis, PT 04/18/2023, 12:22 PM

## 2023-04-24 ENCOUNTER — Ambulatory Visit: Payer: Medicare HMO | Attending: Nurse Practitioner

## 2023-04-24 DIAGNOSIS — M62838 Other muscle spasm: Secondary | ICD-10-CM | POA: Insufficient documentation

## 2023-04-24 DIAGNOSIS — M542 Cervicalgia: Secondary | ICD-10-CM | POA: Diagnosis present

## 2023-04-24 NOTE — Therapy (Signed)
 OUTPATIENT PHYSICAL THERAPY CERVICAL TREATMENT  Patient Name: Teresa Lee MRN: [id number] DOB:1961-03-20, 63 y.o., female Today's Date: 04/24/2023  END OF SESSION:  PT End of Session - 04/24/23 1303     Visit Number 4    Number of Visits 8    Date for PT Re-Evaluation 06/15/23    PT Start Time 1300    PT Stop Time 1357    PT Time Calculation (min) 57 min    Activity Tolerance Patient tolerated treatment well    Behavior During Therapy WFL for tasks assessed/performed               Past Medical History:  Diagnosis Date   Anxiety    Arthritis    osteoarthritis   Asthma    Blind left eye    Cancer (HCC)    skin   Crohn's disease (HCC)    CTS (carpal tunnel syndrome)    DJD (degenerative joint disease)    Both cervical spine and LS spine   Fibromyalgia    GERD (gastroesophageal reflux disease)    Heart murmur    Herniated nucleus pulposus    Hyperlipidemia    Hypertension    IBS (irritable bowel syndrome)    Leukocytosis    Myasthenia gravis    Myasthenia gravis (HCC)    Myasthenia gravis (HCC)    Non-insulin  dependent type 2 diabetes mellitus (HCC) 07/13/2022   Pneumonia    Sleep apnea    Spinal cord stimulator status    Past Surgical History:  Procedure Laterality Date   BACK SURGERY     BIOPSY  05/24/2020   Procedure: BIOPSY;  Surgeon: Donnald Charleston, MD;  Location: [facility] ENDOSCOPY;  Service: Endoscopy;;   BREAST REDUCTION SURGERY     CARPAL TUNNEL RELEASE     bilateral   cervical disc inj     CHOLECYSTECTOMY     COLONOSCOPY WITH PROPOFOL  N/A 05/24/2020   Procedure: COLONOSCOPY WITH PROPOFOL ;  Surgeon: Donnald Charleston, MD;  Location: [facility] ENDOSCOPY;  Service: Endoscopy;  Laterality: N/A;   ENDOMETRIAL ABLATION W/ NOVASURE     ESOPHAGOGASTRODUODENOSCOPY (EGD) WITH PROPOFOL  N/A 05/24/2020   Procedure: ESOPHAGOGASTRODUODENOSCOPY (EGD) WITH PROPOFOL ;  Surgeon: Donnald Charleston, MD;  Location: [facility] ENDOSCOPY;  Service: Endoscopy;  Laterality: N/A;   LUMBAR  LAMINECTOMY/DECOMPRESSION MICRODISCECTOMY Right 05/04/2020   Procedure: Right Lumbar Five Sacral One Microdiscectomy;  Surgeon: Unice Pac, MD;  Location: [facility] OR;  Service: Neurosurgery;  Laterality: Right;  posterior   MELANOMA EXCISION     MUSCLE BIOPSY Left 07/17/2022   Procedure: LEFT THIGH MUSCLE BIOPSY;  Surgeon: Rubin Calamity, MD;  Location: [facility] OR;  Service: General;  Laterality: Left;   NASAL SINUS SURGERY     SPINAL CORD STIMULATOR INSERTION  2019   SPINAL CORD STIMULATOR INSERTION N/A 10/18/2021   Procedure: REMOVAL OF  SPINAL CORD STIMULATOR;  Surgeon: Dawley, Lani BROCKS, DO;  Location: [facility] OR;  Service: Neurosurgery;  Laterality: N/A;  3C   THYMECTOMY     due to myastenia gravis   THYMECTOMY     TRANSFORAMINAL LUMBAR INTERBODY FUSION (TLIF) WITH PEDICLE SCREW FIXATION 1 LEVEL Right 05/04/2020   Procedure: Right Lumbar Four-Five Transforaminal lumbar interbody fusion;  Surgeon: Unice Pac, MD;  Location: [facility] OR;  Service: Neurosurgery;  Laterality: Right;  posterior   Patient Active Problem List   Diagnosis Date Noted   Non-insulin  dependent type 2 diabetes mellitus (HCC) 07/13/2022   Rhabdomyolysis 07/10/2022   Acute gastroenteritis 09/17/2021   Acute on chronic kidney  failure (HCC) 09/16/2021   SIRS (systemic inflammatory response syndrome) (HCC) 06/03/2020   ARF (acute renal failure) (HCC) 05/22/2020   Diarrhea 05/22/2020   Nausea 05/22/2020   Spondylolisthesis of lumbar region 05/04/2020   Chronic pain disorder 08/12/2019   COVID-19    Acute on chronic renal failure (HCC) 02/16/2019   Insomnia disorder 02/27/2018   Fatigue 02/27/2018   Fibromyalgia syndrome 04/30/2017   RLS (restless legs syndrome) 12/26/2016   B12 deficiency 12/26/2016   Vitamin D  deficiency, unspecified 12/26/2016   GERD (gastroesophageal reflux disease) 12/26/2016   Anxiety disorder 12/26/2016   Hyponatremia 10/24/2016   AKI (acute kidney injury) (HCC) 10/23/2016   Hypomagnesemia 10/22/2016    Hypophosphatemia 10/22/2016   Hypokalemia 10/21/2016   Wound infection after surgery 10/21/2016   Sepsis (HCC) 10/21/2016   Myasthenia gravis (HCC) 10/21/2016   Melanoma (HCC) 10/21/2016   Crohn disease (HCC) 10/21/2016   Cellulitis 10/21/2016   Palpitations 08/25/2013   Leukocytosis 05/25/2011   Hyperlipidemia 04/20/2007   Essential hypertension 04/20/2007   Asthma 04/20/2007   CARPAL TUNNEL SYNDROME, HX OF 04/20/2007   REFERRING PROVIDER: Garnell Harlene CROME, FNP   REFERRING DIAG: Neck pain   THERAPY DIAG:  Cervicalgia  Rationale for Evaluation and Treatment: Rehabilitation  ONSET DATE: years ago  SUBJECTIVE:                                                                                                                                                                                                         SUBJECTIVE STATEMENT: She notes that she was not as sore after her last appointment. She has not been any better or worse since her last appointment. She feels that her numbness is not as much as it was prior to therapy.   PERTINENT HISTORY:  Allergies, hypertension, asthma, diabetes type 2, myasthenia gravis, chronic renal failure, anxiety, fibromyalgia, history of COVID-19, and arthritis  PAIN:  Are you having pain? Yes: NPRS scale: Current: 5-6/10 Best: 0/10 Worst: 10/10 Pain location: left side of face, neck, and shoulder Pain description: numbness, tingling, aching, and soreness Aggravating factors: turning her head, laying on 2 pillows Relieving factors: changing positions, heat, and rest  PRECAUTIONS: None  RED FLAGS: None     WEIGHT BEARING RESTRICTIONS: No  FALLS:  Has patient fallen in last 6 months?  No, but I am always unsteady  LIVING ENVIRONMENT: Lives with: lives with their spouse Lives in: House/apartment Has following equipment at home: None  OCCUPATION: N/a  PLOF: Independent  PATIENT GOALS: reduced pain, numbness, and tingling, be  able to  go out more, improved sleep (she has difficulty finding a comfortable place to sleep)  NEXT MD VISIT: 04/26/23  OBJECTIVE:  Note: Objective measures were completed at Evaluation unless otherwise noted.  COGNITION: Overall cognitive status: Within functional limits for tasks assessed  SENSATION: Patient reports intermittent numbness and tingling along the left side of her face, but none currently.   POSTURE: rounded shoulders and forward head  PALPATION: TTP: bilateral scapular stabilizers and left levator scapulae   JOINT MOBILITY:  C3-6: WFL and nonpainful   C7-T1: hypomobile and painful   CERVICAL ROM:   Active ROM A/PROM (deg) eval AROM 04/24/23  Flexion 28 46  Extension 52 54  Right lateral flexion 30; reproduced familiar pain  26; familiar pain   Left lateral flexion 30; reproduced familiar pain (left > right) 30; familiar pain   Right rotation 50 62  Left rotation 46 57   (Blank rows = not tested)  UPPER EXTREMITY ROM: WFL for activities assessed  UPPER EXTREMITY MMT:  MMT Right eval Left eval  Shoulder flexion 4-/5 4-/5  Shoulder extension    Shoulder abduction 4-/5 4-/5  Shoulder adduction    Shoulder extension    Shoulder internal rotation    Shoulder external rotation    Middle trapezius    Lower trapezius    Elbow flexion 5/5 4/5; sore   Elbow extension 5/5 5/5  Wrist flexion    Wrist extension    Wrist ulnar deviation    Wrist radial deviation    Wrist pronation    Wrist supination    Grip strength 15 20   (Blank rows = not tested)  CERVICAL SPECIAL TESTS:  Spurling's test: Negative and Sharp pursor's test: Negative  TREATMENT DATE:                                                                                                                                                                 04/24/23 EXERCISE LOG  Exercise Repetitions and Resistance Comments  Manual therapy    Cervical AROM assessment    UBE 6 minutes @ 120 RPM     Scapular retraction  3 minutes        Blank cell = exercise not performed today  Manual Therapy Soft Tissue Mobilization: left upper trapezius, levator scapulae, and scapular stabilizers, for reduced pain and tone   Modalities: no redness or adverse reaction to today's modalities  Date:  Unattended Estim: left levator scapulae, pre mod @ 80-150 Hz, 15 mins, Pain and Tone Hot Pack: Cervical, 15 mins, Pain and Tone                                     04/18/23  EXERCISE LOG  Exercise Repetitions and Resistance Comments  Scapular retraction  20 reps    Scapular circles  30 reps  CW and CCW               Blank cell = exercise not performed today  Manual Therapy Soft Tissue Mobilization: left upper trapezius, levator scapulae, and scapular stabilizers, for reduced pain and tone   Modalities: no redness or adverse reaction to today's modalities  Date:  Unattended Estim: left upper trapezius and scapular stabilizers, IFC @ 80-150 Hz w/ 40% scan, 15 mins, Pain and Tone Hot Pack: Cervical, 15 mins, Pain and Tone   PATIENT EDUCATION:  Education details: progress with physical therapy, objective findings, and HEP Person educated: Patient Education method: Explanation Education comprehension: verbalized understanding  HOME EXERCISE PROGRAM:  ASSESSMENT:  CLINICAL IMPRESSION: Patient is making good progress with skilled physical therapy as evidenced by her objective measures, functional mobility, and progress toward her goals. She was able to demonstrate a significant improvement in cervical flexion and bilateral rotation since her initial evaluation on 04/12/23. However, she has yet to meet her long term goals for cervical rotation. Today's interventions focused on reduced pain and improved scapulothoracic mobility as this helped to reduce her familiar symptoms. Soft tissue mobilization to her left scapular stabilizers was the most effective at reducing her familiar symptoms. She reported  feeling good upon the conclusion of treatment. She continues to require skilled physical therapy to address her remaining impairments to return to her prior level of function.   OBJECTIVE IMPAIRMENTS: decreased ROM, decreased strength, hypomobility, impaired tone, postural dysfunction, and pain.   ACTIVITY LIMITATIONS: sleeping  PARTICIPATION LIMITATIONS: cleaning  PERSONAL FACTORS: Past/current experiences, Time since onset of injury/illness/exacerbation, and 3+ comorbidities: Allergies, hypertension, asthma, diabetes type 2, myasthenia gravis, chronic renal failure, anxiety, fibromyalgia, history of COVID-19, and arthritis  are also affecting patient's functional outcome.   REHAB POTENTIAL: Fair    CLINICAL DECISION MAKING: Evolving/moderate complexity  EVALUATION COMPLEXITY: Moderate   GOALS: Goals reviewed with patient? Yes  LONG TERM GOALS: Target date: 05/10/23  Patient will be independent with her HEP.  Baseline: Patient reports that she is doing her HEP at least once per day.  Goal status: MET  2.  Patient will be able to complete her daily activities without her familiar symptoms exceeding 8/10. Baseline: Pain is 6-7/10 at worst Goal status: MET  3.  Patient will be able to demonstrate at least 60 degrees of cervical rotation bilaterally for improved awareness of her surroundings.  Baseline: see objective Goal status: ON GOING  4.  Patient will report being able to sleep without being limited by her familiar symptoms.  Baseline: symptoms still wake her up at night Goal status: ON GOING  PLAN:  PT FREQUENCY: 2x/week  PT DURATION: 4 weeks  PLANNED INTERVENTIONS: 97164- PT Re-evaluation, 97110-Therapeutic exercises, 97530- Therapeutic activity, 97112- Neuromuscular re-education, 97535- Self Care, [id number]- Manual therapy, 97014- Electrical stimulation (unattended), 97035- Ultrasound, 02987- Traction (mechanical), Patient/Family education, Dry Needling, Joint  mobilization, Spinal mobilization, Cryotherapy, and Moist heat  PLAN FOR NEXT SESSION: UBE, chin tucks, manual therapy, postural reeducation, and modalities as needed   Lacinda JAYSON Fass, PT 04/24/2023, 5:18 PM

## 2023-04-30 ENCOUNTER — Encounter: Payer: Self-pay | Admitting: *Deleted

## 2023-04-30 ENCOUNTER — Ambulatory Visit: Payer: Medicare HMO | Admitting: *Deleted

## 2023-04-30 DIAGNOSIS — M62838 Other muscle spasm: Secondary | ICD-10-CM

## 2023-04-30 DIAGNOSIS — M542 Cervicalgia: Secondary | ICD-10-CM

## 2023-04-30 NOTE — Therapy (Signed)
 OUTPATIENT PHYSICAL THERAPY CERVICAL TREATMENT  Patient Name: Teresa Lee MRN: [id number] DOB:[birth year], 63 y.o., female Today's Date: 04/30/2023  END OF SESSION:  PT End of Session - 04/30/23 1343     Visit Number 5    Number of Visits 8    Date for PT Re-Evaluation 06/15/23    PT Start Time 1300    PT Stop Time 1347    PT Time Calculation (min) 47 min                Past Medical History:  Diagnosis Date   Anxiety    Arthritis    osteoarthritis   Asthma    Blind left eye    Cancer (HCC)    skin   Crohn's disease (HCC)    CTS (carpal tunnel syndrome)    DJD (degenerative joint disease)    Both cervical spine and LS spine   Fibromyalgia    GERD (gastroesophageal reflux disease)    Heart murmur    Herniated nucleus pulposus    Hyperlipidemia    Hypertension    IBS (irritable bowel syndrome)    Leukocytosis    Myasthenia gravis    Myasthenia gravis (HCC)    Myasthenia gravis (HCC)    Non-insulin  dependent type 2 diabetes mellitus (HCC) 07/13/2022   Pneumonia    Sleep apnea    Spinal cord stimulator status    Past Surgical History:  Procedure Laterality Date   BACK SURGERY     BIOPSY  05/24/2020   Procedure: BIOPSY;  Surgeon: Lanita Pitman, MD;  Location: [facility] ENDOSCOPY;  Service: Endoscopy;;   BREAST REDUCTION SURGERY     CARPAL TUNNEL RELEASE     bilateral   cervical disc inj     CHOLECYSTECTOMY     COLONOSCOPY WITH PROPOFOL  N/A 05/24/2020   Procedure: COLONOSCOPY WITH PROPOFOL ;  Surgeon: Lanita Pitman, MD;  Location: [facility] ENDOSCOPY;  Service: Endoscopy;  Laterality: N/A;   ENDOMETRIAL ABLATION W/ NOVASURE     ESOPHAGOGASTRODUODENOSCOPY (EGD) WITH PROPOFOL  N/A 05/24/2020   Procedure: ESOPHAGOGASTRODUODENOSCOPY (EGD) WITH PROPOFOL ;  Surgeon: Lanita Pitman, MD;  Location: [facility] ENDOSCOPY;  Service: Endoscopy;  Laterality: N/A;   LUMBAR LAMINECTOMY/DECOMPRESSION MICRODISCECTOMY Right 05/04/2020   Procedure: Right Lumbar Five Sacral One Microdiscectomy;   Surgeon: Manya Sells, MD;  Location: [facility] OR;  Service: Neurosurgery;  Laterality: Right;  posterior   MELANOMA EXCISION     MUSCLE BIOPSY Left 07/17/2022   Procedure: LEFT THIGH MUSCLE BIOPSY;  Surgeon: Shela Derby, MD;  Location: [facility] OR;  Service: General;  Laterality: Left;   NASAL SINUS SURGERY     SPINAL CORD STIMULATOR INSERTION  2019   SPINAL CORD STIMULATOR INSERTION N/A 10/18/2021   Procedure: REMOVAL OF  SPINAL CORD STIMULATOR;  Surgeon: Dawley, Colby Daub, DO;  Location: [facility] OR;  Service: Neurosurgery;  Laterality: N/A;  3C   THYMECTOMY     due to myastenia gravis   THYMECTOMY     TRANSFORAMINAL LUMBAR INTERBODY FUSION (TLIF) WITH PEDICLE SCREW FIXATION 1 LEVEL Right 05/04/2020   Procedure: Right Lumbar Four-Five Transforaminal lumbar interbody fusion;  Surgeon: Manya Sells, MD;  Location: [facility] OR;  Service: Neurosurgery;  Laterality: Right;  posterior   Patient Active Problem List   Diagnosis Date Noted   Non-insulin  dependent type 2 diabetes mellitus (HCC) 07/13/2022   Rhabdomyolysis 07/10/2022   Acute gastroenteritis 09/17/2021   Acute on chronic kidney failure (HCC) 09/16/2021   SIRS (systemic inflammatory response syndrome) (HCC) 06/03/2020   ARF (acute renal failure) (  HCC) 05/22/2020   Diarrhea 05/22/2020   Nausea 05/22/2020   Spondylolisthesis of lumbar region 05/04/2020   Chronic pain disorder 08/12/2019   COVID-19    Acute on chronic renal failure (HCC) 02/16/2019   Insomnia disorder 02/27/2018   Fatigue 02/27/2018   Fibromyalgia syndrome 04/30/2017   RLS (restless legs syndrome) 12/26/2016   B12 deficiency 12/26/2016   Vitamin D  deficiency, unspecified 12/26/2016   GERD (gastroesophageal reflux disease) 12/26/2016   Anxiety disorder 12/26/2016   Hyponatremia 10/24/2016   AKI (acute kidney injury) (HCC) 10/23/2016   Hypomagnesemia 10/22/2016   Hypophosphatemia 10/22/2016   Hypokalemia 10/21/2016   Wound infection after surgery 10/21/2016   Sepsis (HCC)  10/21/2016   Myasthenia gravis (HCC) 10/21/2016   Melanoma (HCC) 10/21/2016   Crohn disease (HCC) 10/21/2016   Cellulitis 10/21/2016   Palpitations 08/25/2013   Leukocytosis 05/25/2011   Hyperlipidemia 04/20/2007   Essential hypertension 04/20/2007   Asthma 04/20/2007   CARPAL TUNNEL SYNDROME, HX OF 04/20/2007   REFERRING PROVIDER: Glorious Larry, FNP   REFERRING DIAG: Neck pain   THERAPY DIAG:  Cervicalgia  Other muscle spasm  Rationale for Evaluation and Treatment: Rehabilitation  ONSET DATE: years ago  SUBJECTIVE:                                                                                                                                                                                                         SUBJECTIVE STATEMENT: The numbness symptoms are worse today. Further into the shldr  LT side  PERTINENT HISTORY:  Allergies, hypertension, asthma, diabetes type 2, myasthenia gravis, chronic renal failure, anxiety, fibromyalgia, history of COVID-19, and arthritis  PAIN:  Are you having pain? Yes: NPRS scale: Current: 5-6/10 Best: 0/10 Worst: 10/10 Pain location: left side of face, neck, and shoulder Pain description: numbness, tingling, aching, and soreness Aggravating factors: turning her head, laying on 2 pillows Relieving factors: changing positions, heat, and rest  PRECAUTIONS: None  RED FLAGS: None     WEIGHT BEARING RESTRICTIONS: No  FALLS:  Has patient fallen in last 6 months?  No, "but I am always unsteady"  LIVING ENVIRONMENT: Lives with: lives with their spouse Lives in: House/apartment Has following equipment at home: None  OCCUPATION: N/a  PLOF: Independent  PATIENT GOALS: reduced pain, numbness, and tingling, be able to go out more, improved sleep (she has difficulty finding a comfortable place to sleep)  NEXT MD VISIT: 04/26/23  OBJECTIVE:  Note: Objective measures were completed at Evaluation unless otherwise  noted.  COGNITION: Overall cognitive status: Within functional limits for  tasks assessed  SENSATION: Patient reports intermittent numbness and tingling along the left side of her face, but none currently.   POSTURE: rounded shoulders and forward head  PALPATION: TTP: bilateral scapular stabilizers and left levator scapulae   JOINT MOBILITY:  C3-6: WFL and nonpainful   C7-T1: hypomobile and painful   CERVICAL ROM:   Active ROM A/PROM (deg) eval AROM 04/24/23  Flexion 28 46  Extension 52 54  Right lateral flexion 30; reproduced familiar pain  26; familiar pain   Left lateral flexion 30; reproduced familiar pain (left > right) 30; familiar pain   Right rotation 50 62  Left rotation 46 57   (Blank rows = not tested)  UPPER EXTREMITY ROM: WFL for activities assessed  UPPER EXTREMITY MMT:  MMT Right eval Left eval  Shoulder flexion 4-/5 4-/5  Shoulder extension    Shoulder abduction 4-/5 4-/5  Shoulder adduction    Shoulder extension    Shoulder internal rotation    Shoulder external rotation    Middle trapezius    Lower trapezius    Elbow flexion 5/5 4/5; sore   Elbow extension 5/5 5/5  Wrist flexion    Wrist extension    Wrist ulnar deviation    Wrist radial deviation    Wrist pronation    Wrist supination    Grip strength 15 20   (Blank rows = not tested)  CERVICAL SPECIAL TESTS:  Spurling's test: Negative and Sharp pursor's test: Negative  TREATMENT DATE:                                                                                                                                                                 04/30/23 EXERCISE LOG  Exercise Repetitions and Resistance Comments  Manual therapy    Cervical AROM assessment    UBE 6 minutes @ 120 RPM    Scapular retraction          Blank cell = exercise not performed today  Manual Therapy Soft Tissue Mobilization: left upper trapezius, levator scapulae, and scapular stabilizers, for reduced pain and  tone   Modalities: no redness or adverse reaction to today's modalities US  combo x 10 mins 1.5 w/cm2 x to LT side Utrap/ levator cerv. paras Date:  Unattended Estim: left levator scapulae, pre mod @ 80-150 Hz, 15 mins, Pain and Tone Hot Pack: Cervical, 15 mins, Pain and Tone                                     04/18/23 EXERCISE LOG  Exercise Repetitions and Resistance Comments  Scapular retraction  20 reps    Scapular circles  30 reps  CW and CCW  Blank cell = exercise not performed today  Manual Therapy Soft Tissue Mobilization: left upper trapezius, levator scapulae, and scapular stabilizers, for reduced pain and tone   Modalities: no redness or adverse reaction to today's modalities  Date:  Unattended Estim: left upper trapezius and scapular stabilizers, IFC @ 80-150 Hz w/ 40% scan, 15 mins, Pain and Tone Hot Pack: Cervical, 15 mins, Pain and Tone   PATIENT EDUCATION:  Education details: progress with physical therapy, objective findings, and HEP Person educated: Patient Education method: Explanation Education comprehension: verbalized understanding  HOME EXERCISE PROGRAM:  ASSESSMENT:  CLINICAL IMPRESSION: Patient arrived today with increased symptoms down LT side neck and into shldr. Rx focused on therex, STW, US , as well as IFC to LT side cerv. Paras and Utrap. Decreased pain end of session.    OBJECTIVE IMPAIRMENTS: decreased ROM, decreased strength, hypomobility, impaired tone, postural dysfunction, and pain.   ACTIVITY LIMITATIONS: sleeping  PARTICIPATION LIMITATIONS: cleaning  PERSONAL FACTORS: Past/current experiences, Time since onset of injury/illness/exacerbation, and 3+ comorbidities: Allergies, hypertension, asthma, diabetes type 2, myasthenia gravis, chronic renal failure, anxiety, fibromyalgia, history of COVID-19, and arthritis  are also affecting patient's functional outcome.   REHAB POTENTIAL: Fair    CLINICAL DECISION MAKING:  Evolving/moderate complexity  EVALUATION COMPLEXITY: Moderate   GOALS: Goals reviewed with patient? Yes  LONG TERM GOALS: Target date: 05/10/23  Patient will be independent with her HEP.  Baseline: Patient reports that she is doing her HEP at least once per day.  Goal status: MET  2.  Patient will be able to complete her daily activities without her familiar symptoms exceeding 8/10. Baseline: Pain is 6-7/10 at worst Goal status: MET  3.  Patient will be able to demonstrate at least 60 degrees of cervical rotation bilaterally for improved awareness of her surroundings.  Baseline: see objective Goal status: ON GOING  4.  Patient will report being able to sleep without being limited by her familiar symptoms.  Baseline: symptoms still wake her up at night Goal status: ON GOING  PLAN:  PT FREQUENCY: 2x/week  PT DURATION: 4 weeks  PLANNED INTERVENTIONS: 97164- PT Re-evaluation, 97110-Therapeutic exercises, 97530- Therapeutic activity, 97112- Neuromuscular re-education, 97535- Self Care, 29562- Manual therapy, 97014- Electrical stimulation (unattended), 97035- Ultrasound, 13086- Traction (mechanical), Patient/Family education, Dry Needling, Joint mobilization, Spinal mobilization, Cryotherapy, and Moist heat  PLAN FOR NEXT SESSION: UBE, chin tucks, manual therapy, postural reeducation, and modalities as needed   Ethelda Deangelo,CHRIS, PTA 04/30/2023, 1:48 PM

## 2023-05-02 ENCOUNTER — Ambulatory Visit: Payer: Medicare HMO | Admitting: Physical Therapy

## 2023-05-02 DIAGNOSIS — M542 Cervicalgia: Secondary | ICD-10-CM | POA: Diagnosis not present

## 2023-05-02 DIAGNOSIS — M62838 Other muscle spasm: Secondary | ICD-10-CM

## 2023-05-02 NOTE — Therapy (Signed)
OUTPATIENT PHYSICAL THERAPY CERVICAL TREATMENT  Patient Name: Teresa Lee MRN: [id number] DOB:01-28-1961, 63 y.o., female Today's Date: 05/02/2023  END OF SESSION:  PT End of Session - 05/02/23 1217     Visit Number 6    Number of Visits 8    Date for PT Re-Evaluation 06/15/23    PT Start Time 1101    PT Stop Time 1156    PT Time Calculation (min) 55 min    Activity Tolerance Patient tolerated treatment well    Behavior During Therapy WFL for tasks assessed/performed                Past Medical History:  Diagnosis Date   Anxiety    Arthritis    osteoarthritis   Asthma    Blind left eye    Cancer (HCC)    skin   Crohn's disease (HCC)    CTS (carpal tunnel syndrome)    DJD (degenerative joint disease)    Both cervical spine and LS spine   Fibromyalgia    GERD (gastroesophageal reflux disease)    Heart murmur    Herniated nucleus pulposus    Hyperlipidemia    Hypertension    IBS (irritable bowel syndrome)    Leukocytosis    Myasthenia gravis    Myasthenia gravis (HCC)    Myasthenia gravis (HCC)    Non-insulin dependent type 2 diabetes mellitus (HCC) 07/13/2022   Pneumonia    Sleep apnea    Spinal cord stimulator status    Past Surgical History:  Procedure Laterality Date   BACK SURGERY     BIOPSY  05/24/2020   Procedure: BIOPSY;  Surgeon: Bernette Redbird, MD;  Location: [facility] ENDOSCOPY;  Service: Endoscopy;;   BREAST REDUCTION SURGERY     CARPAL TUNNEL RELEASE     bilateral   cervical disc inj     CHOLECYSTECTOMY     COLONOSCOPY WITH PROPOFOL N/A 05/24/2020   Procedure: COLONOSCOPY WITH PROPOFOL;  Surgeon: Bernette Redbird, MD;  Location: [facility] ENDOSCOPY;  Service: Endoscopy;  Laterality: N/A;   ENDOMETRIAL ABLATION W/ NOVASURE     ESOPHAGOGASTRODUODENOSCOPY (EGD) WITH PROPOFOL N/A 05/24/2020   Procedure: ESOPHAGOGASTRODUODENOSCOPY (EGD) WITH PROPOFOL;  Surgeon: Bernette Redbird, MD;  Location: [facility] ENDOSCOPY;  Service: Endoscopy;  Laterality: N/A;   LUMBAR  LAMINECTOMY/DECOMPRESSION MICRODISCECTOMY Right 05/04/2020   Procedure: Right Lumbar Five Sacral One Microdiscectomy;  Surgeon: Maeola Harman, MD;  Location: [facility] OR;  Service: Neurosurgery;  Laterality: Right;  posterior   MELANOMA EXCISION     MUSCLE BIOPSY Left 07/17/2022   Procedure: LEFT THIGH MUSCLE BIOPSY;  Surgeon: Axel Filler, MD;  Location: [facility] OR;  Service: General;  Laterality: Left;   NASAL SINUS SURGERY     SPINAL CORD STIMULATOR INSERTION  2019   SPINAL CORD STIMULATOR INSERTION N/A 10/18/2021   Procedure: REMOVAL OF  SPINAL CORD STIMULATOR;  Surgeon: Dawley, Alan Mulder, DO;  Location: [facility] OR;  Service: Neurosurgery;  Laterality: N/A;  3C   THYMECTOMY     due to myastenia gravis   THYMECTOMY     TRANSFORAMINAL LUMBAR INTERBODY FUSION (TLIF) WITH PEDICLE SCREW FIXATION 1 LEVEL Right 05/04/2020   Procedure: Right Lumbar Four-Five Transforaminal lumbar interbody fusion;  Surgeon: Maeola Harman, MD;  Location: [facility] OR;  Service: Neurosurgery;  Laterality: Right;  posterior   Patient Active Problem List   Diagnosis Date Noted   Non-insulin dependent type 2 diabetes mellitus (HCC) 07/13/2022   Rhabdomyolysis 07/10/2022   Acute gastroenteritis 09/17/2021   Acute on chronic  kidney failure (HCC) 09/16/2021   SIRS (systemic inflammatory response syndrome) (HCC) 06/03/2020   ARF (acute renal failure) (HCC) 05/22/2020   Diarrhea 05/22/2020   Nausea 05/22/2020   Spondylolisthesis of lumbar region 05/04/2020   Chronic pain disorder 08/12/2019   COVID-19    Acute on chronic renal failure (HCC) 02/16/2019   Insomnia disorder 02/27/2018   Fatigue 02/27/2018   Fibromyalgia syndrome 04/30/2017   RLS (restless legs syndrome) 12/26/2016   B12 deficiency 12/26/2016   Vitamin D deficiency, unspecified 12/26/2016   GERD (gastroesophageal reflux disease) 12/26/2016   Anxiety disorder 12/26/2016   Hyponatremia 10/24/2016   AKI (acute kidney injury) (HCC) 10/23/2016   Hypomagnesemia 10/22/2016    Hypophosphatemia 10/22/2016   Hypokalemia 10/21/2016   Wound infection after surgery 10/21/2016   Sepsis (HCC) 10/21/2016   Myasthenia gravis (HCC) 10/21/2016   Melanoma (HCC) 10/21/2016   Crohn disease (HCC) 10/21/2016   Cellulitis 10/21/2016   Palpitations 08/25/2013   Leukocytosis 05/25/2011   Hyperlipidemia 04/20/2007   Essential hypertension 04/20/2007   Asthma 04/20/2007   CARPAL TUNNEL SYNDROME, HX OF 04/20/2007   REFERRING PROVIDER: Janeece Riggers, FNP   REFERRING DIAG: Neck pain   THERAPY DIAG:  Cervicalgia  Other muscle spasm  Rationale for Evaluation and Treatment: Rehabilitation  ONSET DATE: years ago  SUBJECTIVE:                                                                                                                                                                                                         SUBJECTIVE STATEMENT: Pain at a 5 today.  I have a Neurologist appointment in March.    PERTINENT HISTORY:  Allergies, hypertension, asthma, diabetes type 2, myasthenia gravis, chronic renal failure, anxiety, fibromyalgia, history of COVID-19, and arthritis  PAIN:  Are you having pain? Yes: NPRS scale: Current: 5-6/10 Best: 0/10 Worst: 10/10 Pain location: left side of face, neck, and shoulder Pain description: numbness, tingling, aching, and soreness Aggravating factors: turning her head, laying on 2 pillows Relieving factors: changing positions, heat, and rest  PRECAUTIONS: None  RED FLAGS: None     WEIGHT BEARING RESTRICTIONS: No  FALLS:  Has patient fallen in last 6 months?  No, "but I am always unsteady"  LIVING ENVIRONMENT: Lives with: lives with their spouse Lives in: House/apartment Has following equipment at home: None  OCCUPATION: N/a  PLOF: Independent  PATIENT GOALS: reduced pain, numbness, and tingling, be able to go out more, improved sleep (she has difficulty finding a comfortable place to sleep)  NEXT MD VISIT:  04/26/23  OBJECTIVE:  Note: Objective measures were completed at Evaluation unless otherwise noted.  COGNITION: Overall cognitive status: Within functional limits for tasks assessed  SENSATION: Patient reports intermittent numbness and tingling along the left side of her face, but none currently.   POSTURE: rounded shoulders and forward head  PALPATION: TTP: bilateral scapular stabilizers and left levator scapulae   JOINT MOBILITY:  C3-6: WFL and nonpainful   C7-T1: hypomobile and painful   CERVICAL ROM:   Active ROM A/PROM (deg) eval AROM 04/24/23  Flexion 28 46  Extension 52 54  Right lateral flexion 30; reproduced familiar pain  26; familiar pain   Left lateral flexion 30; reproduced familiar pain (left > right) 30; familiar pain   Right rotation 50 62  Left rotation 46 57   (Blank rows = not tested)  UPPER EXTREMITY ROM: WFL for activities assessed  UPPER EXTREMITY MMT:  MMT Right eval Left eval  Shoulder flexion 4-/5 4-/5  Shoulder extension    Shoulder abduction 4-/5 4-/5  Shoulder adduction    Shoulder extension    Shoulder internal rotation    Shoulder external rotation    Middle trapezius    Lower trapezius    Elbow flexion 5/5 4/5; sore   Elbow extension 5/5 5/5  Wrist flexion    Wrist extension    Wrist ulnar deviation    Wrist radial deviation    Wrist pronation    Wrist supination    Grip strength 15 20   (Blank rows = not tested)  CERVICAL SPECIAL TESTS:  Spurling's test: Negative and Sharp pursor's test: Negative  TREATMENT DATE:      05/02/23:   U/S at 1.50 W/CM2 x 8 minutes to patient's left UT f/b STW/M x 15 minutes f/b  HMP and IFC at 80-150 Hz on 40% scan x 20 minutes.  Normal modality response following removal of modality.                                                                                                                                                                04/30/23 EXERCISE LOG  Exercise Repetitions and  Resistance Comments  Manual therapy    Cervical AROM assessment    UBE 6 minutes @ 120 RPM    Scapular retraction          Blank cell = exercise not performed today  Manual Therapy Soft Tissue Mobilization: left upper trapezius, levator scapulae, and scapular stabilizers, for reduced pain and tone   Modalities: no redness or adverse reaction to today's modalities [name] combo x 10 mins 1.5 w/cm2 x to LT side Utrap/ levator cerv. paras Date:  Unattended Estim: left levator scapulae, pre mod @ 80-150 Hz, 15 mins, Pain and Tone Hot Pack: Cervical, 15 mins, Pain and  Tone    PATIENT EDUCATION:  Education details: progress with physical therapy, objective findings, and HEP Person educated: Patient Education method: Explanation Education comprehension: verbalized understanding  HOME EXERCISE PROGRAM:  ASSESSMENT:  CLINICAL IMPRESSION: Patient with continued pain over left cervical region.  She feels good after treatment but pain comes back.      OBJECTIVE IMPAIRMENTS: decreased ROM, decreased strength, hypomobility, impaired tone, postural dysfunction, and pain.   ACTIVITY LIMITATIONS: sleeping  PARTICIPATION LIMITATIONS: cleaning  PERSONAL FACTORS: Past/current experiences, Time since onset of injury/illness/exacerbation, and 3+ comorbidities: Allergies, hypertension, asthma, diabetes type 2, myasthenia gravis, chronic renal failure, anxiety, fibromyalgia, history of COVID-19, and arthritis  are also affecting patient's functional outcome.   REHAB POTENTIAL: Fair    CLINICAL DECISION MAKING: Evolving/moderate complexity  EVALUATION COMPLEXITY: Moderate   GOALS: Goals reviewed with patient? Yes  LONG TERM GOALS: Target date: 05/10/23  Patient will be independent with her HEP.  Baseline: Patient reports that she is doing her HEP at least once per day.  Goal status: MET  2.  Patient will be able to complete her daily activities without her familiar symptoms exceeding  8/10. Baseline: Pain is 6-7/10 at worst Goal status: MET  3.  Patient will be able to demonstrate at least 60 degrees of cervical rotation bilaterally for improved awareness of her surroundings.  Baseline: see objective Goal status: ON GOING  4.  Patient will report being able to sleep without being limited by her familiar symptoms.  Baseline: symptoms still wake her up at night Goal status: ON GOING  PLAN:  PT FREQUENCY: 2x/week  PT DURATION: 4 weeks  PLANNED INTERVENTIONS: 97164- PT Re-evaluation, 97110-Therapeutic exercises, 97530- Therapeutic activity, 97112- Neuromuscular re-education, 97535- Self Care, 46962- Manual therapy, 97014- Electrical stimulation (unattended), 97035- Ultrasound, 95284- Traction (mechanical), Patient/Family education, Dry Needling, Joint mobilization, Spinal mobilization, Cryotherapy, and Moist heat  PLAN FOR NEXT SESSION: UBE, chin tucks, manual therapy, postural reeducation, and modalities as needed   Cylie Dor, [place], PT 05/02/2023, 12:21 PM

## 2023-05-07 ENCOUNTER — Ambulatory Visit: Payer: Medicare HMO

## 2023-05-07 DIAGNOSIS — M542 Cervicalgia: Secondary | ICD-10-CM

## 2023-05-07 NOTE — Therapy (Signed)
OUTPATIENT PHYSICAL THERAPY CERVICAL TREATMENT  Patient Name: Teresa Lee MRN: [id number] DOB:[birth year], 63 y.o., female Today's Date: 05/07/2023  END OF SESSION:  PT End of Session - 05/07/23 1302     Visit Number 7    Number of Visits 8    Date for PT Re-Evaluation 06/15/23    PT Start Time 1300    PT Stop Time 1344    PT Time Calculation (min) 44 min    Activity Tolerance Patient tolerated treatment well    Behavior During Therapy WFL for tasks assessed/performed                 Past Medical History:  Diagnosis Date   Anxiety    Arthritis    osteoarthritis   Asthma    Blind left eye    Cancer (HCC)    skin   Crohn's disease (HCC)    CTS (carpal tunnel syndrome)    DJD (degenerative joint disease)    Both cervical spine and LS spine   Fibromyalgia    GERD (gastroesophageal reflux disease)    Heart murmur    Herniated nucleus pulposus    Hyperlipidemia    Hypertension    IBS (irritable bowel syndrome)    Leukocytosis    Myasthenia gravis    Myasthenia gravis (HCC)    Myasthenia gravis (HCC)    Non-insulin dependent type 2 diabetes mellitus (HCC) 07/13/2022   Pneumonia    Sleep apnea    Spinal cord stimulator status    Past Surgical History:  Procedure Laterality Date   BACK SURGERY     BIOPSY  05/24/2020   Procedure: BIOPSY;  Surgeon: Bernette Redbird, MD;  Location: [facility] ENDOSCOPY;  Service: Endoscopy;;   BREAST REDUCTION SURGERY     CARPAL TUNNEL RELEASE     bilateral   cervical disc inj     CHOLECYSTECTOMY     COLONOSCOPY WITH PROPOFOL N/A 05/24/2020   Procedure: COLONOSCOPY WITH PROPOFOL;  Surgeon: Bernette Redbird, MD;  Location: [facility] ENDOSCOPY;  Service: Endoscopy;  Laterality: N/A;   ENDOMETRIAL ABLATION W/ NOVASURE     ESOPHAGOGASTRODUODENOSCOPY (EGD) WITH PROPOFOL N/A 05/24/2020   Procedure: ESOPHAGOGASTRODUODENOSCOPY (EGD) WITH PROPOFOL;  Surgeon: Bernette Redbird, MD;  Location: [facility] ENDOSCOPY;  Service: Endoscopy;  Laterality: N/A;    LUMBAR LAMINECTOMY/DECOMPRESSION MICRODISCECTOMY Right 05/04/2020   Procedure: Right Lumbar Five Sacral One Microdiscectomy;  Surgeon: Maeola Harman, MD;  Location: [facility] OR;  Service: Neurosurgery;  Laterality: Right;  posterior   MELANOMA EXCISION     MUSCLE BIOPSY Left 07/17/2022   Procedure: LEFT THIGH MUSCLE BIOPSY;  Surgeon: Axel Filler, MD;  Location: [facility] OR;  Service: General;  Laterality: Left;   NASAL SINUS SURGERY     SPINAL CORD STIMULATOR INSERTION  2019   SPINAL CORD STIMULATOR INSERTION N/A 10/18/2021   Procedure: REMOVAL OF  SPINAL CORD STIMULATOR;  Surgeon: Dawley, Alan Mulder, DO;  Location: [facility] OR;  Service: Neurosurgery;  Laterality: N/A;  3C   THYMECTOMY     due to myastenia gravis   THYMECTOMY     TRANSFORAMINAL LUMBAR INTERBODY FUSION (TLIF) WITH PEDICLE SCREW FIXATION 1 LEVEL Right 05/04/2020   Procedure: Right Lumbar Four-Five Transforaminal lumbar interbody fusion;  Surgeon: Maeola Harman, MD;  Location: [facility] OR;  Service: Neurosurgery;  Laterality: Right;  posterior   Patient Active Problem List   Diagnosis Date Noted   Non-insulin dependent type 2 diabetes mellitus (HCC) 07/13/2022   Rhabdomyolysis 07/10/2022   Acute gastroenteritis 09/17/2021   Acute on  chronic kidney failure (HCC) 09/16/2021   SIRS (systemic inflammatory response syndrome) (HCC) 06/03/2020   ARF (acute renal failure) (HCC) 05/22/2020   Diarrhea 05/22/2020   Nausea 05/22/2020   Spondylolisthesis of lumbar region 05/04/2020   Chronic pain disorder 08/12/2019   COVID-19    Acute on chronic renal failure (HCC) 02/16/2019   Insomnia disorder 02/27/2018   Fatigue 02/27/2018   Fibromyalgia syndrome 04/30/2017   RLS (restless legs syndrome) 12/26/2016   B12 deficiency 12/26/2016   Vitamin D deficiency, unspecified 12/26/2016   GERD (gastroesophageal reflux disease) 12/26/2016   Anxiety disorder 12/26/2016   Hyponatremia 10/24/2016   AKI (acute kidney injury) (HCC) 10/23/2016   Hypomagnesemia  10/22/2016   Hypophosphatemia 10/22/2016   Hypokalemia 10/21/2016   Wound infection after surgery 10/21/2016   Sepsis (HCC) 10/21/2016   Myasthenia gravis (HCC) 10/21/2016   Melanoma (HCC) 10/21/2016   Crohn disease (HCC) 10/21/2016   Cellulitis 10/21/2016   Palpitations 08/25/2013   Leukocytosis 05/25/2011   Hyperlipidemia 04/20/2007   Essential hypertension 04/20/2007   Asthma 04/20/2007   CARPAL TUNNEL SYNDROME, HX OF 04/20/2007   REFERRING PROVIDER: Janeece Riggers, FNP   REFERRING DIAG: Neck pain   THERAPY DIAG:  Cervicalgia  Rationale for Evaluation and Treatment: Rehabilitation  ONSET DATE: years ago  SUBJECTIVE:                                                                                                                                                                                                         SUBJECTIVE STATEMENT: Patient reports that she felt good for a few hours to a day after her last appointment. However, she feels that it is about the same. Her back and legs are bothering her more than her neck today.   PERTINENT HISTORY:  Allergies, hypertension, asthma, diabetes type 2, myasthenia gravis, chronic renal failure, anxiety, fibromyalgia, history of COVID-19, and arthritis  PAIN:  Are you having pain? Yes: NPRS scale: Current: 0/10 Best: 0/10 Worst: 10/10 Pain location: left side of face, neck, and shoulder Pain description: numbness, tingling, aching, and soreness Aggravating factors: turning her head, laying on 2 pillows Relieving factors: changing positions, heat, and rest  PRECAUTIONS: None  RED FLAGS: None     WEIGHT BEARING RESTRICTIONS: No  FALLS:  Has patient fallen in last 6 months?  No, "but I am always unsteady"  LIVING ENVIRONMENT: Lives with: lives with their spouse Lives in: House/apartment Has following equipment at home: None  OCCUPATION: N/a  PLOF: Independent  PATIENT GOALS: reduced pain, numbness, and  tingling, be able  to go out more, improved sleep (she has difficulty finding a comfortable place to sleep)  NEXT MD VISIT: 05/28/23 (neurologist) 05/24/23 (referring physician)   OBJECTIVE:  Note: Objective measures were completed at Evaluation unless otherwise noted.  COGNITION: Overall cognitive status: Within functional limits for tasks assessed  SENSATION: Patient reports intermittent numbness and tingling along the left side of her face, but none currently.   POSTURE: rounded shoulders and forward head  PALPATION: TTP: bilateral scapular stabilizers and left levator scapulae   JOINT MOBILITY:  C3-6: WFL and nonpainful   C7-T1: hypomobile and painful   CERVICAL ROM:   Active ROM A/PROM (deg) eval AROM 04/24/23 AROM 05/07/23  Flexion 28 46 52  Extension 52 54 54  Right lateral flexion 30; reproduced familiar pain  26; familiar pain  32; familiar pain   Left lateral flexion 30; reproduced familiar pain (left > right) 30; familiar pain  32; familiar pain   Right rotation 50 62 52  Left rotation 46 57 59   (Blank rows = not tested)  UPPER EXTREMITY ROM: WFL for activities assessed  UPPER EXTREMITY MMT:  MMT Right eval Left eval  Shoulder flexion 4-/5 4-/5  Shoulder extension    Shoulder abduction 4-/5 4-/5  Shoulder adduction    Shoulder extension    Shoulder internal rotation    Shoulder external rotation    Middle trapezius    Lower trapezius    Elbow flexion 5/5 4/5; sore   Elbow extension 5/5 5/5  Wrist flexion    Wrist extension    Wrist ulnar deviation    Wrist radial deviation    Wrist pronation    Wrist supination    Grip strength 15 20   (Blank rows = not tested)  CERVICAL SPECIAL TESTS:  Spurling's test: Negative and Sharp pursor's test: Negative  TREATMENT DATE:                                       05/07/23 EXERCISE LOG  Exercise Repetitions and Resistance Comments  ROM assessment     Blank cell = exercise not performed today  Modalities: no  redness or adverse reaction to today's modalities  Date:  Unattended Estim: left upper trapezius, pre mod @ 80-150 Hz, 15 mins, Pain and Tone Hot Pack: Cervical, 15 mins, Pain and Tone  05/02/23:   U/S at 1.50 W/CM2 x 8 minutes to patient's left UT f/b STW/M x 15 minutes f/b  HMP and IFC at 80-150 Hz on 40% scan x 20 minutes.  Normal modality response following removal of modality.                                   04/30/23 EXERCISE LOG  Exercise Repetitions and Resistance Comments  Manual therapy    Cervical AROM assessment    UBE 6 minutes @ 120 RPM    Scapular retraction          Blank cell = exercise not performed today  Manual Therapy Soft Tissue Mobilization: left upper trapezius, levator scapulae, and scapular stabilizers, for reduced pain and tone   Modalities: no redness or adverse reaction to today's modalities [name] combo x 10 mins 1.5 w/cm2 x to LT side Utrap/ levator cerv. paras Date:  Unattended Estim: left levator scapulae, pre mod @ 80-150 Hz, 15 mins, Pain and  Tone Hot Pack: Cervical, 15 mins, Pain and Tone  PATIENT EDUCATION:  Education details: progress with physical therapy, objective findings, healing, MD follow up, and HEP Person educated: Patient Education method: Explanation Education comprehension: verbalized understanding  HOME EXERCISE PROGRAM:  ASSESSMENT:  CLINICAL IMPRESSION: Patient has made minimal progress with skilled physical therapy as evidenced by her subjective reports, objective measures, functional mobility and progress toward her goals. She was able to demonstrate improved cervical flexion, but she was unable to demonstrate improvements in any other planes of motion. Cervical side bending also reproduced her familiar symptoms. She was able to achieve brief improvements in her familiar symptoms, but she was unable to achieve long-term carryover in these improvements. She reported understanding and she felt comfortable being discharged from  skilled physical therapy due to her lack of progress with skilled physical therapy.   PHYSICAL THERAPY DISCHARGE SUMMARY  Visits from Start of Care: 7  Current functional level related to goals / functional outcomes: Patient was able to partially meet her goals for skilled physical therapy.    Remaining deficits: Pain   Education / Equipment: HEP    Patient agrees to discharge. Patient goals were partially met. Patient is being discharged due to maximized rehab potential.    OBJECTIVE IMPAIRMENTS: decreased ROM, decreased strength, hypomobility, impaired tone, postural dysfunction, and pain.   ACTIVITY LIMITATIONS: sleeping  PARTICIPATION LIMITATIONS: cleaning  PERSONAL FACTORS: Past/current experiences, Time since onset of injury/illness/exacerbation, and 3+ comorbidities: Allergies, hypertension, asthma, diabetes type 2, myasthenia gravis, chronic renal failure, anxiety, fibromyalgia, history of COVID-19, and arthritis  are also affecting patient's functional outcome.   REHAB POTENTIAL: Fair    CLINICAL DECISION MAKING: Evolving/moderate complexity  EVALUATION COMPLEXITY: Moderate   GOALS: Goals reviewed with patient? Yes  LONG TERM GOALS: Target date: 05/10/23  Patient will be independent with her HEP.  Baseline: Patient reports that she is doing her HEP at least once per day.  Goal status: MET  2.  Patient will be able to complete her daily activities without her familiar symptoms exceeding 8/10. Baseline: Pain is 6-7/10 at worst Goal status: MET  3.  Patient will be able to demonstrate at least 60 degrees of cervical rotation bilaterally for improved awareness of her surroundings.  Baseline: see objective Goal status: NOT MET  4.  Patient will report being able to sleep without being limited by her familiar symptoms.  Baseline: symptoms still wake her up some night Goal status: PARTIALLY MET  PLAN:  PT FREQUENCY: 2x/week  PT DURATION: 4 weeks  PLANNED  INTERVENTIONS: 97164- PT Re-evaluation, 97110-Therapeutic exercises, 97530- Therapeutic activity, 97112- Neuromuscular re-education, 97535- Self Care, 16109- Manual therapy, 97014- Electrical stimulation (unattended), 97035- Ultrasound, 60454- Traction (mechanical), Patient/Family education, Dry Needling, Joint mobilization, Spinal mobilization, Cryotherapy, and Moist heat  PLAN FOR NEXT SESSION: UBE, chin tucks, manual therapy, postural reeducation, and modalities as needed   Granville Lewis, PT 05/07/2023, 2:17 PM

## 2023-05-29 ENCOUNTER — Other Ambulatory Visit: Payer: Self-pay | Admitting: Neurological Surgery

## 2023-05-29 DIAGNOSIS — M4722 Other spondylosis with radiculopathy, cervical region: Secondary | ICD-10-CM

## 2023-06-16 ENCOUNTER — Encounter: Payer: Self-pay | Admitting: Neurological Surgery

## 2023-06-21 ENCOUNTER — Ambulatory Visit
Admission: RE | Admit: 2023-06-21 | Discharge: 2023-06-21 | Disposition: A | Discharge status: Home / Self Care | Source: Ambulatory Visit | Attending: Neurological Surgery | Admitting: Neurological Surgery

## 2023-06-21 DIAGNOSIS — M4722 Other spondylosis with radiculopathy, cervical region: Secondary | ICD-10-CM

## 2023-07-18 ENCOUNTER — Ambulatory Visit
Admission: RE | Admit: 2023-07-18 | Discharge: 2023-07-18 | Disposition: A | Discharge status: Home / Self Care | Source: Ambulatory Visit | Attending: Anesthesiology | Admitting: Anesthesiology

## 2023-07-18 ENCOUNTER — Other Ambulatory Visit: Payer: Self-pay | Admitting: Anesthesiology

## 2023-07-18 DIAGNOSIS — M7062 Trochanteric bursitis, left hip: Secondary | ICD-10-CM

## 2023-07-18 DIAGNOSIS — M7061 Trochanteric bursitis, right hip: Secondary | ICD-10-CM

## 2023-09-28 ENCOUNTER — Inpatient Hospital Stay (HOSPITAL_BASED_OUTPATIENT_CLINIC_OR_DEPARTMENT_OTHER)
Admission: EM | Admit: 2023-09-28 | Discharge: 2023-09-30 | DRG: 872 | Disposition: A | Discharge status: Home / Self Care | Attending: Internal Medicine | Admitting: Internal Medicine

## 2023-09-28 ENCOUNTER — Encounter (HOSPITAL_BASED_OUTPATIENT_CLINIC_OR_DEPARTMENT_OTHER): Payer: Self-pay

## 2023-09-28 ENCOUNTER — Other Ambulatory Visit: Payer: Self-pay

## 2023-09-28 ENCOUNTER — Emergency Department (HOSPITAL_BASED_OUTPATIENT_CLINIC_OR_DEPARTMENT_OTHER)

## 2023-09-28 DIAGNOSIS — N3000 Acute cystitis without hematuria: Principal | ICD-10-CM | POA: Diagnosis present

## 2023-09-28 DIAGNOSIS — J45909 Unspecified asthma, uncomplicated: Secondary | ICD-10-CM | POA: Diagnosis present

## 2023-09-28 DIAGNOSIS — Z8701 Personal history of pneumonia (recurrent): Secondary | ICD-10-CM

## 2023-09-28 DIAGNOSIS — R509 Fever, unspecified: Secondary | ICD-10-CM | POA: Diagnosis present

## 2023-09-28 DIAGNOSIS — F419 Anxiety disorder, unspecified: Secondary | ICD-10-CM | POA: Diagnosis present

## 2023-09-28 DIAGNOSIS — R519 Headache, unspecified: Secondary | ICD-10-CM | POA: Diagnosis present

## 2023-09-28 DIAGNOSIS — M797 Fibromyalgia: Secondary | ICD-10-CM | POA: Diagnosis present

## 2023-09-28 DIAGNOSIS — Z7984 Long term (current) use of oral hypoglycemic drugs: Secondary | ICD-10-CM

## 2023-09-28 DIAGNOSIS — Z83438 Family history of other disorder of lipoprotein metabolism and other lipidemia: Secondary | ICD-10-CM

## 2023-09-28 DIAGNOSIS — G4730 Sleep apnea, unspecified: Secondary | ICD-10-CM | POA: Diagnosis present

## 2023-09-28 DIAGNOSIS — K219 Gastro-esophageal reflux disease without esophagitis: Secondary | ICD-10-CM | POA: Diagnosis present

## 2023-09-28 DIAGNOSIS — G7000 Myasthenia gravis without (acute) exacerbation: Secondary | ICD-10-CM | POA: Diagnosis present

## 2023-09-28 DIAGNOSIS — Z9109 Other allergy status, other than to drugs and biological substances: Secondary | ICD-10-CM

## 2023-09-28 DIAGNOSIS — E785 Hyperlipidemia, unspecified: Secondary | ICD-10-CM | POA: Diagnosis present

## 2023-09-28 DIAGNOSIS — Z91040 Latex allergy status: Secondary | ICD-10-CM

## 2023-09-28 DIAGNOSIS — H5462 Unqualified visual loss, left eye, normal vision right eye: Secondary | ICD-10-CM | POA: Diagnosis present

## 2023-09-28 DIAGNOSIS — Z888 Allergy status to other drugs, medicaments and biological substances status: Secondary | ICD-10-CM

## 2023-09-28 DIAGNOSIS — Z7951 Long term (current) use of inhaled steroids: Secondary | ICD-10-CM | POA: Diagnosis not present

## 2023-09-28 DIAGNOSIS — Z6831 Body mass index (BMI) 31.0-31.9, adult: Secondary | ICD-10-CM | POA: Diagnosis not present

## 2023-09-28 DIAGNOSIS — Z8249 Family history of ischemic heart disease and other diseases of the circulatory system: Secondary | ICD-10-CM | POA: Diagnosis not present

## 2023-09-28 DIAGNOSIS — Z833 Family history of diabetes mellitus: Secondary | ICD-10-CM | POA: Diagnosis not present

## 2023-09-28 DIAGNOSIS — Z85820 Personal history of malignant melanoma of skin: Secondary | ICD-10-CM | POA: Diagnosis not present

## 2023-09-28 DIAGNOSIS — M545 Low back pain, unspecified: Secondary | ICD-10-CM | POA: Diagnosis present

## 2023-09-28 DIAGNOSIS — Z882 Allergy status to sulfonamides status: Secondary | ICD-10-CM

## 2023-09-28 DIAGNOSIS — K5090 Crohn's disease, unspecified, without complications: Secondary | ICD-10-CM | POA: Diagnosis present

## 2023-09-28 DIAGNOSIS — I10 Essential (primary) hypertension: Secondary | ICD-10-CM | POA: Diagnosis present

## 2023-09-28 DIAGNOSIS — M160 Bilateral primary osteoarthritis of hip: Secondary | ICD-10-CM | POA: Diagnosis present

## 2023-09-28 DIAGNOSIS — E1165 Type 2 diabetes mellitus with hyperglycemia: Secondary | ICD-10-CM | POA: Diagnosis present

## 2023-09-28 DIAGNOSIS — E669 Obesity, unspecified: Secondary | ICD-10-CM | POA: Diagnosis present

## 2023-09-28 DIAGNOSIS — G8929 Other chronic pain: Secondary | ICD-10-CM | POA: Diagnosis present

## 2023-09-28 DIAGNOSIS — Z1152 Encounter for screening for COVID-19: Secondary | ICD-10-CM

## 2023-09-28 DIAGNOSIS — Z881 Allergy status to other antibiotic agents status: Secondary | ICD-10-CM

## 2023-09-28 DIAGNOSIS — B9620 Unspecified Escherichia coli [E. coli] as the cause of diseases classified elsewhere: Secondary | ICD-10-CM | POA: Diagnosis present

## 2023-09-28 DIAGNOSIS — Z885 Allergy status to narcotic agent status: Secondary | ICD-10-CM

## 2023-09-28 DIAGNOSIS — Z91018 Allergy to other foods: Secondary | ICD-10-CM

## 2023-09-28 DIAGNOSIS — Z794 Long term (current) use of insulin: Secondary | ICD-10-CM

## 2023-09-28 DIAGNOSIS — N390 Urinary tract infection, site not specified: Secondary | ICD-10-CM | POA: Diagnosis present

## 2023-09-28 DIAGNOSIS — Z7952 Long term (current) use of systemic steroids: Secondary | ICD-10-CM

## 2023-09-28 DIAGNOSIS — A419 Sepsis, unspecified organism: Secondary | ICD-10-CM | POA: Diagnosis present

## 2023-09-28 DIAGNOSIS — Z79624 Long term (current) use of inhibitors of nucleotide synthesis: Secondary | ICD-10-CM

## 2023-09-28 DIAGNOSIS — Z79899 Other long term (current) drug therapy: Secondary | ICD-10-CM

## 2023-09-28 DIAGNOSIS — Z79890 Hormone replacement therapy (postmenopausal): Secondary | ICD-10-CM

## 2023-09-28 LAB — COMPREHENSIVE METABOLIC PANEL WITH GFR
ALT: 14 U/L (ref 0–44)
AST: 19 U/L (ref 15–41)
Albumin: 3.6 g/dL (ref 3.5–5.0)
Alkaline Phosphatase: 68 U/L (ref 38–126)
Anion gap: 17 — ABNORMAL HIGH (ref 5–15)
BUN: 21 mg/dL (ref 8–23)
CO2: 17 mmol/L — ABNORMAL LOW (ref 22–32)
Calcium: 9.2 mg/dL (ref 8.9–10.3)
Chloride: 103 mmol/L (ref 98–111)
Creatinine, Ser: 1.25 mg/dL — ABNORMAL HIGH (ref 0.44–1.00)
GFR, Estimated: 49 mL/min — ABNORMAL LOW (ref >60)
Glucose, Bld: 136 mg/dL — ABNORMAL HIGH (ref 70–99)
Potassium: 4.3 mmol/L (ref 3.5–5.1)
Sodium: 137 mmol/L (ref 135–145)
Total Bilirubin: 0.9 mg/dL (ref 0.0–1.2)
Total Protein: 6.6 g/dL (ref 6.5–8.1)

## 2023-09-28 LAB — RESP PANEL BY RT-PCR (RSV, FLU A&B, COVID)  RVPGX2
Influenza A by PCR: NEGATIVE
Influenza B by PCR: NEGATIVE
Resp Syncytial Virus by PCR: NEGATIVE
SARS Coronavirus 2 by RT PCR: NEGATIVE

## 2023-09-28 LAB — URINALYSIS, W/ REFLEX TO CULTURE (INFECTION SUSPECTED)
Bilirubin Urine: NEGATIVE
Glucose, UA: NEGATIVE mg/dL
Ketones, ur: NEGATIVE mg/dL
Nitrite: POSITIVE — AB
Protein, ur: 100 mg/dL — AB
Specific Gravity, Urine: 1.015 (ref 1.005–1.030)
WBC, UA: >50 WBC/hpf (ref 0–5)
pH: 5.5 (ref 5.0–8.0)

## 2023-09-28 LAB — I-STAT VENOUS BLOOD GAS, ED
Acid-base deficit: 5 mmol/L — ABNORMAL HIGH (ref 0.0–2.0)
Bicarbonate: 20.2 mmol/L (ref 20.0–28.0)
Calcium, Ion: 1.24 mmol/L (ref 1.15–1.40)
HCT: 31 % — ABNORMAL LOW (ref 36.0–46.0)
Hemoglobin: 10.5 g/dL — ABNORMAL LOW (ref 12.0–15.0)
O2 Saturation: 80 %
Potassium: 4.8 mmol/L (ref 3.5–5.1)
Sodium: 136 mmol/L (ref 135–145)
TCO2: 21 mmol/L — ABNORMAL LOW (ref 22–32)
pCO2, Ven: 37 mmHg — ABNORMAL LOW (ref 44–60)
pH, Ven: 7.345 (ref 7.25–7.43)
pO2, Ven: 46 mmHg — ABNORMAL HIGH (ref 32–45)

## 2023-09-28 LAB — CBC
HCT: 36.2 % (ref 36.0–46.0)
Hemoglobin: 12.6 g/dL (ref 12.0–15.0)
MCH: 33.3 pg (ref 26.0–34.0)
MCHC: 34.8 g/dL (ref 30.0–36.0)
MCV: 95.8 fL (ref 80.0–100.0)
Platelets: 207 K/uL (ref 150–400)
RBC: 3.78 MIL/uL — ABNORMAL LOW (ref 3.87–5.11)
RDW: 15.6 % — ABNORMAL HIGH (ref 11.5–15.5)
WBC: 16.2 K/uL — ABNORMAL HIGH (ref 4.0–10.5)
nRBC: 0 % (ref 0.0–0.2)

## 2023-09-28 LAB — LACTIC ACID, PLASMA
Lactic Acid, Venous: 1.4 mmol/L (ref 0.5–1.9)
Lactic Acid, Venous: 1.1 mmol/L (ref 0.5–1.9)

## 2023-09-28 LAB — GLUCOSE, CAPILLARY: Glucose-Capillary: 153 mg/dL — ABNORMAL HIGH (ref 70–99)

## 2023-09-28 LAB — CK: Total CK: 39 U/L (ref 38–234)

## 2023-09-28 MED ORDER — PREGABALIN 50 MG PO CAPS
100.0000 mg | ORAL_CAPSULE | Freq: Every day | ORAL | Status: DC
  Administered 2023-09-28 – 2023-09-29 (×2): 100 mg via ORAL
  Filled 2023-09-28 (×2): qty 2

## 2023-09-28 MED ORDER — INSULIN ASPART 100 UNIT/ML IJ SOLN: 0.0000 [IU] | Freq: Every day | INTRAMUSCULAR | Status: DC

## 2023-09-28 MED ORDER — LACTATED RINGERS IV BOLUS
1000.0000 mL | Freq: Once | INTRAVENOUS | Status: AC
  Administered 2023-09-28: 1000 mL via INTRAVENOUS

## 2023-09-28 MED ORDER — ENOXAPARIN SODIUM 40 MG/0.4ML IJ SOSY
40.0000 mg | PREFILLED_SYRINGE | INTRAMUSCULAR | Status: DC
  Administered 2023-09-28 – 2023-09-29 (×2): 40 mg via SUBCUTANEOUS
  Filled 2023-09-28 (×2): qty 0.4

## 2023-09-28 MED ORDER — METOPROLOL SUCCINATE ER 25 MG PO TB24
25.0000 mg | ORAL_TABLET | Freq: Every day | ORAL | Status: DC
  Administered 2023-09-28 – 2023-09-29 (×2): 25 mg via ORAL
  Filled 2023-09-28 (×2): qty 1

## 2023-09-28 MED ORDER — MESALAMINE 400 MG PO CPDR
800.0000 mg | DELAYED_RELEASE_CAPSULE | Freq: Two times a day (BID) | ORAL | Status: DC
  Administered 2023-09-28 – 2023-09-30 (×4): 800 mg via ORAL
  Filled 2023-09-28 (×5): qty 2

## 2023-09-28 MED ORDER — PYRIDOSTIGMINE BROMIDE 60 MG PO TABS: 30.0000 mg | ORAL_TABLET | Freq: Three times a day (TID) | ORAL | Status: DC | PRN

## 2023-09-28 MED ORDER — ROPINIROLE HCL 1 MG PO TABS
1.0000 mg | ORAL_TABLET | Freq: Every day | ORAL | Status: DC
  Administered 2023-09-28 – 2023-09-29 (×2): 1 mg via ORAL
  Filled 2023-09-28 (×2): qty 1

## 2023-09-28 MED ORDER — SODIUM CHLORIDE 0.9 % IV SOLN
1.0000 g | Freq: Every day | INTRAVENOUS | Status: DC
  Administered 2023-09-29 – 2023-09-30 (×2): 1 g via INTRAVENOUS
  Filled 2023-09-28 (×2): qty 10

## 2023-09-28 MED ORDER — PANTOPRAZOLE SODIUM 40 MG PO TBEC
40.0000 mg | DELAYED_RELEASE_TABLET | Freq: Every day | ORAL | Status: DC
  Administered 2023-09-29 – 2023-09-30 (×2): 40 mg via ORAL
  Filled 2023-09-28 (×2): qty 1

## 2023-09-28 MED ORDER — INSULIN ASPART 100 UNIT/ML IJ SOLN
0.0000 [IU] | Freq: Three times a day (TID) | INTRAMUSCULAR | Status: DC
  Administered 2023-09-29 (×2): 3 [IU] via SUBCUTANEOUS
  Administered 2023-09-30: 2 [IU] via SUBCUTANEOUS
  Administered 2023-09-30: 3 [IU] via SUBCUTANEOUS

## 2023-09-28 MED ORDER — ACETAMINOPHEN 325 MG PO TABS: 650.0000 mg | ORAL_TABLET | Freq: Four times a day (QID) | ORAL | Status: DC | PRN

## 2023-09-28 MED ORDER — MYCOPHENOLATE MOFETIL 250 MG PO CAPS
1000.0000 mg | ORAL_CAPSULE | Freq: Two times a day (BID) | ORAL | Status: DC
  Administered 2023-09-28 – 2023-09-30 (×4): 1000 mg via ORAL
  Filled 2023-09-28 (×5): qty 4

## 2023-09-28 MED ORDER — INSULIN GLARGINE-YFGN 100 UNIT/ML ~~LOC~~ SOLN
25.0000 [IU] | Freq: Every day | SUBCUTANEOUS | Status: DC
  Filled 2023-09-28 (×3): qty 0.25

## 2023-09-28 MED ORDER — SODIUM CHLORIDE 0.9 % IV SOLN
1.0000 g | Freq: Once | INTRAVENOUS | Status: AC
  Administered 2023-09-28: 1 g via INTRAVENOUS
  Filled 2023-09-28: qty 10

## 2023-09-28 MED ORDER — LACTATED RINGERS IV BOLUS
500.0000 mL | Freq: Once | INTRAVENOUS | Status: AC
  Administered 2023-09-28: 500 mL via INTRAVENOUS

## 2023-09-28 MED ORDER — METHOCARBAMOL 500 MG PO TABS
500.0000 mg | ORAL_TABLET | Freq: Every day | ORAL | Status: DC
  Administered 2023-09-28 – 2023-09-29 (×2): 500 mg via ORAL
  Filled 2023-09-28 (×2): qty 1

## 2023-09-28 MED ORDER — ONDANSETRON HCL 4 MG/2ML IJ SOLN
4.0000 mg | Freq: Once | INTRAMUSCULAR | Status: AC
  Administered 2023-09-28: 4 mg via INTRAVENOUS
  Filled 2023-09-28: qty 2

## 2023-09-28 MED ORDER — FLUTICASONE FUROATE-VILANTEROL 100-25 MCG/ACT IN AEPB
1.0000 | INHALATION_SPRAY | Freq: Every day | RESPIRATORY_TRACT | Status: DC
  Administered 2023-09-29 – 2023-09-30 (×2): 1 via RESPIRATORY_TRACT
  Filled 2023-09-28: qty 28

## 2023-09-28 MED ORDER — HYDROCODONE-ACETAMINOPHEN 10-325 MG PO TABS
1.0000 | ORAL_TABLET | ORAL | Status: DC
  Administered 2023-09-28 – 2023-09-30 (×8): 1 via ORAL
  Filled 2023-09-28 (×8): qty 1

## 2023-09-28 MED ORDER — ALBUTEROL SULFATE (2.5 MG/3ML) 0.083% IN NEBU: 2.5000 mg | INHALATION_SOLUTION | Freq: Four times a day (QID) | RESPIRATORY_TRACT | Status: DC | PRN

## 2023-09-28 MED ORDER — LOSARTAN POTASSIUM 50 MG PO TABS
100.0000 mg | ORAL_TABLET | Freq: Every day | ORAL | Status: DC
  Administered 2023-09-29 – 2023-09-30 (×2): 100 mg via ORAL
  Filled 2023-09-28 (×2): qty 2

## 2023-09-28 MED ORDER — ACETAMINOPHEN 650 MG RE SUPP: 650.0000 mg | Freq: Four times a day (QID) | RECTAL | Status: DC | PRN

## 2023-09-28 MED ORDER — METHOCARBAMOL 500 MG PO TABS
500.0000 mg | ORAL_TABLET | Freq: Every day | ORAL | Status: DC | PRN
  Administered 2023-09-30: 500 mg via ORAL
  Filled 2023-09-28: qty 1

## 2023-09-28 MED ORDER — SPIRONOLACTONE 25 MG PO TABS
25.0000 mg | ORAL_TABLET | Freq: Every day | ORAL | Status: DC
  Administered 2023-09-29 – 2023-09-30 (×2): 25 mg via ORAL
  Filled 2023-09-28 (×2): qty 1

## 2023-09-28 NOTE — ED Notes (Signed)
 Pt. Has complaints of nausea and chest pain off and on for 4 weeks with chest pain.  Pt. In no distress and has also complaints of headache.  Pt. Has had no vomiting.

## 2023-09-28 NOTE — ED Triage Notes (Addendum)
 Pt presents with fever, chills, HA, neck pain that started last night. She has had some nausea and diarrhea. Denies URI symptoms or cough. Denies sick contacts. Pt took one of her Rx Norco 10-325 mg last at 10:00 this AM.

## 2023-09-28 NOTE — H&P (Signed)
 History and Physical    Patient: Teresa Lee FMW:[id number] DOB: June 05, 1960 DOA: 09/28/2023 DOS: the patient was seen and examined on 09/28/2023 PCP: Sophronia Ozell BROCKS, MD  Patient coming from: Home  Chief Complaint:  Chief Complaint  Patient presents with   Fever   HPI: Teresa Lee is a 63 y.o. female with medical history significant of diabetes HTN, HLD, MG  with complaints of dysuria nausea.  Workup showed her UA was positive for UTI she has leukocytosis.  She was noted to have fevers and chills. Pt noted to be febrile. Started on empiric abx. Hospitalisst consulted for consideration for admiission\  Review of Systems: As mentioned in the history of present illness. All other systems reviewed and are negative. Past Medical History:  Diagnosis Date   Anxiety    Arthritis    osteoarthritis   Asthma    Blind left eye    Cancer (HCC)    skin   Crohn's disease (HCC)    CTS (carpal tunnel syndrome)    DJD (degenerative joint disease)    Both cervical spine and LS spine   Fibromyalgia    GERD (gastroesophageal reflux disease)    Heart murmur    Herniated nucleus pulposus    Hyperlipidemia    Hypertension    IBS (irritable bowel syndrome)    Leukocytosis    Myasthenia gravis    Myasthenia gravis (HCC)    Myasthenia gravis (HCC)    Non-insulin  dependent type 2 diabetes mellitus (HCC) 07/13/2022   Pneumonia    Sleep apnea    Spinal cord stimulator status    Past Surgical History:  Procedure Laterality Date   BACK SURGERY     BIOPSY  05/24/2020   Procedure: BIOPSY;  Surgeon: Donnald Charleston, MD;  Location: [facility] ENDOSCOPY;  Service: Endoscopy;;   BREAST REDUCTION SURGERY     CARPAL TUNNEL RELEASE     bilateral   cervical disc inj     CHOLECYSTECTOMY     COLONOSCOPY WITH PROPOFOL  N/A 05/24/2020   Procedure: COLONOSCOPY WITH PROPOFOL ;  Surgeon: Donnald Charleston, MD;  Location: [facility] ENDOSCOPY;  Service: Endoscopy;  Laterality: N/A;   ENDOMETRIAL ABLATION W/ NOVASURE      ESOPHAGOGASTRODUODENOSCOPY (EGD) WITH PROPOFOL  N/A 05/24/2020   Procedure: ESOPHAGOGASTRODUODENOSCOPY (EGD) WITH PROPOFOL ;  Surgeon: Donnald Charleston, MD;  Location: [facility] ENDOSCOPY;  Service: Endoscopy;  Laterality: N/A;   LUMBAR LAMINECTOMY/DECOMPRESSION MICRODISCECTOMY Right 05/04/2020   Procedure: Right Lumbar Five Sacral One Microdiscectomy;  Surgeon: Unice Pac, MD;  Location: [facility] OR;  Service: Neurosurgery;  Laterality: Right;  posterior   MELANOMA EXCISION     MUSCLE BIOPSY Left 07/17/2022   Procedure: LEFT THIGH MUSCLE BIOPSY;  Surgeon: Rubin Calamity, MD;  Location: [facility] OR;  Service: General;  Laterality: Left;   NASAL SINUS SURGERY     SPINAL CORD STIMULATOR INSERTION  2019   SPINAL CORD STIMULATOR INSERTION N/A 10/18/2021   Procedure: REMOVAL OF  SPINAL CORD STIMULATOR;  Surgeon: Dawley, Lani BROCKS, DO;  Location: [facility] OR;  Service: Neurosurgery;  Laterality: N/A;  3C   THYMECTOMY     due to myastenia gravis   THYMECTOMY     TRANSFORAMINAL LUMBAR INTERBODY FUSION (TLIF) WITH PEDICLE SCREW FIXATION 1 LEVEL Right 05/04/2020   Procedure: Right Lumbar Four-Five Transforaminal lumbar interbody fusion;  Surgeon: Unice Pac, MD;  Location: [facility] OR;  Service: Neurosurgery;  Laterality: Right;  posterior   Social History:  reports that she has never smoked. She has never used smokeless tobacco. She  reports current alcohol use. She reports that she does not use drugs.  Allergies  Allergen Reactions   Citrus Dermatitis    Causes burning of skin and blisters   Duloxetine Nausea Only and Other (See Comments)    Cymbalta    Other Hives, Nausea Only, Dermatitis, Rash and Other (See Comments)    Some nuts cause RASHES, too- some are tolerated   Latex Other (See Comments)    BLISTERS   Lorazepam  Other (See Comments)    Hallucinations   Orange Concentrate [Flavoring Agent (Non-Screening)] Other (See Comments)    Acid reflux   Percocet [Oxycodone -Acetaminophen ] Hives   Sulfa Antibiotics Hives    Statins Other (See Comments)    Statin induced myositis from rosuvastatin . DO NOT Attempt to try other statins.   Tape Other (See Comments)    BLISTERS and BURNS THE SKIN   Bioflavonoids Rash    Causes burning of skin and blisters   Cefixime Other (See Comments)    Due to myasthenia gravis   Nucynta [Tapentadol] Other (See Comments)    Headache     Family History  Problem Relation Age of Onset   Asthma Daughter    Heart disease Mother    Cancer Mother    Hyperlipidemia Mother    Hypertension Mother    Diabetes Father     Prior to Admission medications   Medication Sig Start Date End Date Taking? Authorizing Provider  HYDROcodone -acetaminophen  (NORCO) 10-325 MG tablet Take 1 tablet by mouth 5 (five) times daily as needed for moderate pain. 08/25/17  Yes [provider]  albuterol  (PROVENTIL ) (2.5 MG/3ML) 0.083% nebulizer solution Take 2.5 mg by nebulization every 6 (six) hours as needed for shortness of breath. 12/26/19   [provider]  albuterol  (VENTOLIN  HFA) 108 (90 Base) MCG/ACT inhaler 1 PUFF EVERY 6 HOURS AS NEEDED FOR WHEEZING Patient taking differently: Inhale 1 puff into the lungs every 6 (six) hours as needed for wheezing or shortness of breath. 09/05/19   [place], Betty G, MD  Blood Glucose Monitoring Suppl DEVI 1 each by Does not apply route in the morning, at noon, and at bedtime. May substitute to any manufacturer covered by patient's insurance. 07/19/22   Akula, Vijaya, MD  Continuous Glucose Receiver (DEXCOM G7 RECEIVER) DEVI 1 Units by Does not apply route daily. 07/19/22   Akula, Vijaya, MD  cyanocobalamin  (,VITAMIN B-12,) 1000 MCG/ML injection Inject 1,000 mcg into the muscle See admin instructions. Inject 1,000 mcg into the muscle every 6-8 weeks    [provider]  desmopressin  (DDAVP ) 0.2 MG tablet Take 0.6 mg by mouth at bedtime. 08/22/21   [provider]  estradiol  (ESTRACE ) 2 MG tablet Take 2 mg by mouth at bedtime.    [provider]  hydrALAZINE  (APRESOLINE ) 25 MG tablet Take 3 tablets (75 mg total) by mouth every 8 (eight) hours. 07/19/22   Akula, Vijaya, MD  hydrocortisone  (ANUSOL -HC) 2.5 % rectal cream Place 1 Application rectally 2 (two) times daily. 07/30/22   Curatolo, Adam, DO  insulin  aspart (NOVOLOG ) 100 UNIT/ML FlexPen CBG 70 - 120: 0 units  CBG 121 - 150: 2 units  CBG 151 - 200: 3 units  CBG 201 - 250: 5 units  CBG 251 - 300: 8 units  CBG 301 - 350: 11 units  CBG 351 - 400: 15 units 07/19/22   Akula, Vijaya, MD  insulin  glargine (LANTUS ) 100 UNIT/ML Solostar Pen Inject 25 Units into the skin at bedtime. 07/19/22  Akula, Vijaya, MD  Insulin  Pen Needle (PEN NEEDLES 29GX1/2) 29G X MISC 100 Units by Does not apply route 3 (three) times daily before meals. 07/19/22   Akula, Vijaya, MD  Insulin  Pen Needle (PEN NEEDLES) 32G X 4 MM MISC 100 Units by Does not apply route 3 (three) times daily before meals. 07/19/22   Akula, Vijaya, MD  loperamide  (IMODIUM ) 2 MG capsule Take 2 capsules (4 mg total) by mouth as needed for diarrhea or loose stools. Patient not taking: Reported on 07/10/2022 09/19/21   Cheryle Page, MD  losartan  (COZAAR ) 50 MG tablet Take 50 mg by mouth daily. 09/30/21   [provider]  Mesalamine  (ASACOL ) 400 MG CPDR DR capsule Take 800 mg by mouth in the morning and at bedtime. 10/10/21   [provider]  methocarbamol  (ROBAXIN ) 750 MG tablet Take 750 mg by mouth at bedtime. 02/07/19   [provider]  metoprolol  succinate (TOPROL -XL) 25 MG 24 hr tablet Take 25 mg by mouth at bedtime. 09/09/21   [provider]  mycophenolate  (CELLCEPT ) 500 MG tablet Take 1,000 mg by mouth in the morning and at bedtime.    [provider]  naloxone  (NARCAN ) nasal spray 4 mg/0.1 mL Place 1 spray into the nose once as needed (opioid overdose).    [provider]  ondansetron  (ZOFRAN ) 4 MG tablet Take 4 mg by mouth every 8 (eight) hours as needed for nausea or  vomiting.    [provider]  pantoprazole  (PROTONIX ) 40 MG tablet TAKE 1 TABLET DAILY Patient taking differently: Take 40 mg by mouth daily before breakfast. 10/15/19   [place], Betty G, MD  potassium chloride  SA (KLOR-CON  M) 20 MEQ tablet Take 20 mEq by mouth at bedtime. 06/15/21   [provider]  predniSONE  (DELTASONE ) 10 MG tablet Prednisone  50 mg daily for 3 days followed by  Prednisone  40 mg daily for 5 days followed by  Prednisone  20 mg daily for 5 days followed by  Prednisone  10 mg daily 07/19/22   Akula, Vijaya, MD  pregabalin  (LYRICA ) 75 MG capsule Take 75 mg by mouth at bedtime.    [provider]  progesterone  (PROMETRIUM ) 100 MG capsule Take 100 mg by mouth at bedtime.    [provider]  pyridostigmine  (MESTINON ) 60 MG tablet Take 30 mg by mouth 3 (three) times daily as needed (for Myasthenia gravis symtoms).    [provider]  rOPINIRole  (REQUIP ) 1 MG tablet Take 1-2 tablets (1-2 mg total) by mouth at bedtime. Patient taking differently: Take 1 mg by mouth See admin instructions. Take 1 tablet (1 mg) by mouth daily at bedtime and may take an additional 1 mg later if still needed for restless legs 08/08/19   [place], Betty G, MD  Spacer/Aero-Holding Chambers (AEROCHAMBER PLUS) inhaler Use as instructed to use with inahaler. 08/30/18   [place], Betty G, MD  SYMBICORT  80-4.5 MCG/ACT inhaler Inhale 2 puffs into the lungs 2 (two) times daily. Patient taking differently: Inhale 2 puffs into the lungs 2 (two) times daily as needed (shortness of breath). 09/05/19   [place], Betty G, MD  Vitamin D , Ergocalciferol , (DRISDOL ) 1.25 MG (50000 UNIT) CAPS capsule Take 50,000 Units by mouth every 14 (fourteen) days.    [provider]    Physical Exam: Vitals:   09/28/23 1330 09/28/23 1509 09/28/23 1600 09/28/23 1658  BP: 113/65  113/74 117/71  Pulse: 75 79  76  Resp: 17 17 17 18   Temp:  98.5 F (  36.9 C)  98.7 F (37.1 C)  TempSrc:  Oral     SpO2: 98% 98% 99% 100%  Weight:      Height:       General exam: Awake, laying in bed, in nad Respiratory system: Normal respiratory effort, no wheezing Cardiovascular system: regular rate, s1, s2 Gastrointestinal system: Soft, nondistended, positive BS Central nervous system: CN2-12 grossly intact, strength intact Extremities: Perfused, no clubbing Skin: Normal skin turgor, no notable skin lesions seen Psychiatry: Mood normal // no visual hallucinations   Data Reviewed: Labs reviewed: Na 136, K 4.8, Cr 1.25, WBC 16.2, Hgb 10.5  Assessment and Plan: Myasthenia gravis:  -Seems stable -Cont home meds once confirmed by pharmacy     Left and right hip pain/osteoarthritis:  -cont with with PT/OT while in hospital  ith PCp .    New NIDDM-2 with hyperglycemia and hyperlipidemia:  -cont SSI as needed -Resume home meds once confirmed by  pharmacy.    HTN:  -BP currently stable and controlled -Cont home meds once confirmed by pharmacy  UTI with sepsis present on admit -Follow pan cultures -cont empiric rocephin    Obesity    Advance Care Planning:   Code Status: Prior Full  Consults:   Family Communication: Pt in room  Severity of Illness: The appropriate patient status for this patient is INPATIENT. Inpatient status is judged to be reasonable and necessary in order to provide the required intensity of service to ensure the patient's safety. The patient's presenting symptoms, physical exam findings, and initial radiographic and laboratory data in the context of their chronic comorbidities is felt to place them at high risk for further clinical deterioration. Furthermore, it is not anticipated that the patient will be medically stable for discharge from the hospital within 2 midnights of admission.   * I certify that at the point of admission it is my clinical judgment that the patient will require inpatient hospital care spanning beyond 2 midnights from the point of admission  due to high intensity of service, high risk for further deterioration and high frequency of surveillance required.*  Author: Garnette Pelt, MD 09/28/2023 6:01 PM  For on call review [email or web address].

## 2023-09-28 NOTE — Plan of Care (Signed)
 This is a 63 year old female from [facility] accepted to Med[facility] inpatient with complaints of dysuria nausea.  Workup showed her UA was positive for UTI she has leukocytosis.  She had a fever of 101 at home with chills.  Initial blood pressure was in the 90s received IV fluids with improvement to 117/71.  She has a long significant past medical history including myasthenia gravis Crohn's disease on chronic steroids.  She does not have a chronic Foley.  She did not have a CT abdomen done in the ED.  She is admitted for IV antibiotics following up urine culture etc.

## 2023-09-28 NOTE — ED Provider Notes (Signed)
 [place] [facility] Provider Note   CSN: [id number] Arrival date & time: 09/28/23  1036     Patient presents with: Fever   Teresa Lee is a 63 y.o. female.   This is a 63 year old female presenting the emergency department with generalized malaise and chills.  Has not felt well for the past several days had a headache last night that has since improved, complaining of some nausea and vomiting.  Also having some dysuria.  Some chronic pain issues in hips and low back.   Fever      Prior to Admission medications   Medication Sig Start Date End Date Taking? Authorizing Provider  albuterol  (PROVENTIL ) (2.5 MG/3ML) 0.083% nebulizer solution Take 2.5 mg by nebulization every 6 (six) hours as needed for shortness of breath. 12/26/19   [provider]  albuterol  (VENTOLIN  HFA) 108 (90 Base) MCG/ACT inhaler 1 PUFF EVERY 6 HOURS AS NEEDED FOR WHEEZING Patient taking differently: Inhale 1 puff into the lungs every 6 (six) hours as needed for wheezing or shortness of breath. 09/05/19   [place], Betty G, MD  Blood Glucose Monitoring Suppl DEVI 1 each by Does not apply route in the morning, at noon, and at bedtime. May substitute to any manufacturer covered by patient's insurance. 07/19/22   Akula, Vijaya, MD  Continuous Glucose Receiver (DEXCOM G7 RECEIVER) DEVI 1 Units by Does not apply route daily. 07/19/22   Cherlyn Labella, MD  cyanocobalamin  (,VITAMIN B-12,) 1000 MCG/ML injection Inject 1,000 mcg into the muscle See admin instructions. Inject 1,000 mcg into the muscle every 6-8 weeks    [provider]  desmopressin  (DDAVP ) 0.2 MG tablet Take 0.6 mg by mouth at bedtime. 08/22/21   [provider]  estradiol  (ESTRACE ) 2 MG tablet Take 2 mg by mouth at bedtime.    [provider]  hydrALAZINE  (APRESOLINE ) 25 MG tablet Take 3 tablets (75 mg total) by mouth every 8 (eight) hours. 07/19/22   Akula, Vijaya, MD   HYDROcodone -acetaminophen  (NORCO) 10-325 MG tablet Take 1 tablet by mouth 5 (five) times daily as needed for moderate pain. 08/25/17   [provider]  hydrocortisone  (ANUSOL -HC) 2.5 % rectal cream Place 1 Application rectally 2 (two) times daily. 07/30/22   Curatolo, Adam, DO  insulin  aspart (NOVOLOG ) 100 UNIT/ML FlexPen CBG 70 - 120: 0 units  CBG 121 - 150: 2 units  CBG 151 - 200: 3 units  CBG 201 - 250: 5 units  CBG 251 - 300: 8 units  CBG 301 - 350: 11 units  CBG 351 - 400: 15 units 07/19/22   Akula, Vijaya, MD  insulin  glargine (LANTUS ) 100 UNIT/ML Solostar Pen Inject 25 Units into the skin at bedtime. 07/19/22   Akula, Vijaya, MD  Insulin  Pen Needle (PEN NEEDLES 29GX1/2) 29G X MISC 100 Units by Does not apply route 3 (three) times daily before meals. 07/19/22   Akula, Vijaya, MD  Insulin  Pen Needle (PEN NEEDLES) 32G X 4 MM MISC 100 Units by Does not apply route 3 (three) times daily before meals. 07/19/22   Akula, Vijaya, MD  loperamide  (IMODIUM ) 2 MG capsule Take 2 capsules (4 mg total) by mouth as needed for diarrhea or loose stools. Patient not taking: Reported on 07/10/2022 09/19/21   Cheryle Page, MD  losartan  (COZAAR ) 50 MG tablet Take 50 mg by mouth daily. 09/30/21   [provider]  Mesalamine  (ASACOL ) 400 MG CPDR DR capsule Take 800 mg by mouth in  the morning and at bedtime. 10/10/21   [provider]  methocarbamol  (ROBAXIN ) 750 MG tablet Take 750 mg by mouth at bedtime. 02/07/19   [provider]  metoprolol  succinate (TOPROL -XL) 25 MG 24 hr tablet Take 25 mg by mouth at bedtime. 09/09/21   [provider]  mycophenolate  (CELLCEPT ) 500 MG tablet Take 1,000 mg by mouth in the morning and at bedtime.    [provider]  naloxone  (NARCAN ) nasal spray 4 mg/0.1 mL Place 1 spray into the nose once as needed (opioid overdose).    [provider]  ondansetron  (ZOFRAN ) 4 MG tablet Take 4 mg by mouth every 8 (eight) hours as needed  for nausea or vomiting.    [provider]  pantoprazole  (PROTONIX ) 40 MG tablet TAKE 1 TABLET DAILY Patient taking differently: Take 40 mg by mouth daily before breakfast. 10/15/19   [place], Betty G, MD  potassium chloride  SA (KLOR-CON  M) 20 MEQ tablet Take 20 mEq by mouth at bedtime. 06/15/21   [provider]  predniSONE  (DELTASONE ) 10 MG tablet Prednisone  50 mg daily for 3 days followed by  Prednisone  40 mg daily for 5 days followed by  Prednisone  20 mg daily for 5 days followed by  Prednisone  10 mg daily 07/19/22   Akula, Vijaya, MD  pregabalin  (LYRICA ) 75 MG capsule Take 75 mg by mouth at bedtime.    [provider]  progesterone  (PROMETRIUM ) 100 MG capsule Take 100 mg by mouth at bedtime.    [provider]  pyridostigmine  (MESTINON ) 60 MG tablet Take 30 mg by mouth 3 (three) times daily as needed (for Myasthenia gravis symtoms).    [provider]  rOPINIRole  (REQUIP ) 1 MG tablet Take 1-2 tablets (1-2 mg total) by mouth at bedtime. Patient taking differently: Take 1 mg by mouth See admin instructions. Take 1 tablet (1 mg) by mouth daily at bedtime and may take an additional 1 mg later if still needed for restless legs 08/08/19   [place], Betty G, MD  Spacer/Aero-Holding Chambers (AEROCHAMBER PLUS) inhaler Use as instructed to use with inahaler. 08/30/18   [place], Betty G, MD  SYMBICORT  80-4.5 MCG/ACT inhaler Inhale 2 puffs into the lungs 2 (two) times daily. Patient taking differently: Inhale 2 puffs into the lungs 2 (two) times daily as needed (shortness of breath). 09/05/19   [place], Betty G, MD  Vitamin D , Ergocalciferol , (DRISDOL ) 1.25 MG (50000 UNIT) CAPS capsule Take 50,000 Units by mouth every 14 (fourteen) days.    [provider]    Allergies: Duloxetine, Latex, Lorazepam , Orange concentrate [flavoring agent (non-screening)], Other, Percocet [oxycodone -acetaminophen ], Sulfa antibiotics, Statins, Tape, Bioflavonoids, Cefixime, and  Nucynta [tapentadol]    Review of Systems  Constitutional:  Positive for fever.    Updated Vital Signs BP 113/65   Pulse 79   Temp 98.5 F (36.9 C) (Oral)   Resp 17   Ht 5' (1.524 m)   Wt 73.9 kg   SpO2 98%   BMI 31.83 kg/m   Physical Exam Vitals and nursing note reviewed.  Constitutional:      Appearance: She is ill-appearing.  HENT:     Head: Normocephalic.     Nose: Nose normal.     Mouth/Throat:     Mouth: Mucous membranes are dry.  Eyes:     Conjunctiva/sclera: Conjunctivae normal.  Cardiovascular:     Rate and Rhythm: Regular rhythm. Tachycardia present.  Pulmonary:     Effort: Pulmonary effort is normal.  Breath sounds: Normal breath sounds.  Abdominal:     General: Abdomen is flat. There is no distension.     Tenderness: There is no abdominal tenderness. There is no guarding or rebound.  Musculoskeletal:        General: Normal range of motion.  Skin:    General: Skin is warm and dry.     Capillary Refill: Capillary refill takes less than 2 seconds.  Neurological:     Mental Status: She is alert and oriented to person, place, and time.  Psychiatric:        Mood and Affect: Mood normal.        Behavior: Behavior normal.     (all labs ordered are listed, but only abnormal results are displayed) Labs Reviewed  CBC - Abnormal; Notable for the following components:      Result Value   WBC 16.2 (*)    RBC 3.78 (*)    RDW 15.6 (*)    All other components within normal limits  COMPREHENSIVE METABOLIC PANEL WITH GFR - Abnormal; Notable for the following components:   CO2 17 (*)    Glucose, Bld 136 (*)    Creatinine, Ser 1.25 (*)    GFR, Estimated 49 (*)    Anion gap 17 (*)    All other components within normal limits  URINALYSIS, W/ REFLEX TO CULTURE (INFECTION SUSPECTED) - Abnormal; Notable for the following components:   APPearance CLOUDY (*)    Hgb urine dipstick SMALL (*)    Protein, ur 100 (*)    Nitrite POSITIVE (*)    Leukocytes,Ua SMALL  (*)    Bacteria, UA MANY (*)    All other components within normal limits  I-STAT VENOUS BLOOD GAS, ED - Abnormal; Notable for the following components:   pCO2, Ven 37.0 (*)    pO2, Ven 46 (*)    TCO2 21 (*)    Acid-base deficit 5.0 (*)    HCT 31.0 (*)    Hemoglobin 10.5 (*)    All other components within normal limits  RESP PANEL BY RT-PCR (RSV, FLU A&B, COVID)  RVPGX2  URINE CULTURE  CULTURE, BLOOD (ROUTINE X 2)  CULTURE, BLOOD (ROUTINE X 2)  LACTIC ACID, PLASMA  LACTIC ACID, PLASMA  CK    EKG: EKG Interpretation Date/Time:  Friday September 28 2023 14:46:55 EDT Ventricular Rate:  78 PR Interval:  155 QRS Duration:  77 QT Interval:  366 QTC Calculation: 417 R Axis:   20  Text Interpretation: Sinus rhythm Low voltage, precordial leads Confirmed by Neysa Clap [phone]) on 09/28/2023 3:03:31 PM  Radiology: DG Chest Portable 1 View Result Date: 09/28/2023 CLINICAL DATA:  Fever EXAM: PORTABLE CHEST 1 VIEW COMPARISON:  June 03, 2020 FINDINGS: The heart size and mediastinal contours are within normal limits. Both lungs are clear. Status post sternotomy. IMPRESSION: No active disease. Electronically Signed   By: Michaeline Blanch M.D.   On: 09/28/2023 11:48     Procedures   Medications Ordered in the ED  lactated ringers  bolus 500 mL (has no administration in time range)  lactated ringers  bolus 1,000 mL (0 mLs Intravenous Stopped 09/28/23 1431)  cefTRIAXone  (ROCEPHIN ) 1 g in sodium chloride  0.9 % 100 mL IVPB (0 g Intravenous Stopped 09/28/23 1506)  ondansetron  (ZOFRAN ) injection 4 mg (4 mg Intravenous Given 09/28/23 1506)  Medical Decision Making This is a 63 year old female presenting emergency department with generalized malaise, chills, nausea.  Fevers at home reported 101.  She is 99.2 here heart rate in the 90s.  Soft blood pressures on arrival in the low 100s, did trend downward to 88/62.  Subsequently improved with IV fluids.  Minor tachypnea,  but not in respiratory distress.  Per chart review has a quite complex past medical history to include myasthenia gravis, hypertension, hyperlipidemia, chronic pain, Crohn's disease, diabetes.  Concern for viral etiology however flu/COVID/RSV negative.  She has a benign abdominal exam.  No electrolyte abnormalities, slightly low bicarb and a minor elevated anion gap.  Not significantly hyperglycemic.  Follow-up pH 7.34.  Unlikely DKA.  UA consistent with urinary tract infection.  She is at baseline renal function.  No transaminitis to suggest PAD or biliary disease.  She does have a leukocytosis, fever, was hypotensive.  However her lactate is normal.  She does have UTI.  Was given Rocephin , IV fluids.  Given patient's abnormal vitals, and clinical appearance with UTI/probable pyelonephritis will admit for IV antibiotics and observation as she is high risk for decompensation given her ongoing comorbidities.  Amount and/or Complexity of Data Reviewed Independent Historian:     Details: Has been notes patient has been in nontraumatic rhabdo and renal failure before Labs: ordered. Radiology: ordered and independent interpretation performed.    Details: Considered CT abdomen, however benign abdominal exam.  Does have UTI and fever, got some chronic back pain.  Lower suspicion for acute renal stone causing symptoms.  Risk Prescription drug management. Decision regarding hospitalization. Diagnosis or treatment significantly limited by social determinants of health. Risk Details: Poor health literacy      Final diagnoses:  Acute cystitis without hematuria    ED Discharge Orders     None          Neysa Caron PARAS, DO 09/28/23 1522

## 2023-09-29 DIAGNOSIS — N3000 Acute cystitis without hematuria: Secondary | ICD-10-CM | POA: Diagnosis not present

## 2023-09-29 LAB — COMPREHENSIVE METABOLIC PANEL WITH GFR
ALT: 13 U/L (ref 0–44)
AST: 10 U/L — ABNORMAL LOW (ref 15–41)
Albumin: 2.6 g/dL — ABNORMAL LOW (ref 3.5–5.0)
Alkaline Phosphatase: 46 U/L (ref 38–126)
Anion gap: 11 (ref 5–15)
BUN: 22 mg/dL (ref 8–23)
CO2: 20 mmol/L — ABNORMAL LOW (ref 22–32)
Calcium: 8.7 mg/dL — ABNORMAL LOW (ref 8.9–10.3)
Chloride: 107 mmol/L (ref 98–111)
Creatinine, Ser: 0.93 mg/dL (ref 0.44–1.00)
GFR, Estimated: >60 mL/min (ref >60)
Glucose, Bld: 117 mg/dL — ABNORMAL HIGH (ref 70–99)
Potassium: 3.8 mmol/L (ref 3.5–5.1)
Sodium: 138 mmol/L (ref 135–145)
Total Bilirubin: 0.9 mg/dL (ref 0.0–1.2)
Total Protein: 5.6 g/dL — ABNORMAL LOW (ref 6.5–8.1)

## 2023-09-29 LAB — GLUCOSE, CAPILLARY
Glucose-Capillary: 96 mg/dL (ref 70–99)
Glucose-Capillary: 155 mg/dL — ABNORMAL HIGH (ref 70–99)
Glucose-Capillary: 178 mg/dL — ABNORMAL HIGH (ref 70–99)
Glucose-Capillary: 224 mg/dL — ABNORMAL HIGH (ref 70–99)

## 2023-09-29 LAB — CBC
HCT: 32.6 % — ABNORMAL LOW (ref 36.0–46.0)
Hemoglobin: 10.6 g/dL — ABNORMAL LOW (ref 12.0–15.0)
MCH: 32.5 pg (ref 26.0–34.0)
MCHC: 32.5 g/dL (ref 30.0–36.0)
MCV: 100 fL (ref 80.0–100.0)
Platelets: 183 K/uL (ref 150–400)
RBC: 3.26 MIL/uL — ABNORMAL LOW (ref 3.87–5.11)
RDW: 15.2 % (ref 11.5–15.5)
WBC: 11.7 K/uL — ABNORMAL HIGH (ref 4.0–10.5)
nRBC: 0 % (ref 0.0–0.2)

## 2023-09-29 LAB — HIV ANTIBODY (ROUTINE TESTING W REFLEX): HIV Screen 4th Generation wRfx: NONREACTIVE

## 2023-09-29 MED ORDER — PREDNISONE 10 MG PO TABS
10.0000 mg | ORAL_TABLET | Freq: Every day | ORAL | Status: DC
  Administered 2023-09-29 – 2023-09-30 (×2): 10 mg via ORAL
  Filled 2023-09-29 (×2): qty 1

## 2023-09-29 MED ORDER — PROCHLORPERAZINE EDISYLATE 10 MG/2ML IJ SOLN
10.0000 mg | Freq: Four times a day (QID) | INTRAMUSCULAR | Status: DC | PRN
  Administered 2023-09-29: 10 mg via INTRAVENOUS
  Filled 2023-09-29: qty 2

## 2023-09-29 MED ORDER — KETOROLAC TROMETHAMINE 15 MG/ML IJ SOLN
INTRAMUSCULAR | Status: AC
  Filled 2023-09-29: qty 1

## 2023-09-29 MED ORDER — ACETAMINOPHEN 650 MG RE SUPP: 650.0000 mg | Freq: Four times a day (QID) | RECTAL | Status: DC | PRN

## 2023-09-29 MED ORDER — ACETAMINOPHEN 325 MG PO TABS
650.0000 mg | ORAL_TABLET | Freq: Four times a day (QID) | ORAL | Status: DC | PRN
  Administered 2023-09-29 – 2023-09-30 (×3): 650 mg via ORAL
  Filled 2023-09-29 (×3): qty 2

## 2023-09-29 MED ORDER — KETOROLAC TROMETHAMINE 15 MG/ML IJ SOLN
30.0000 mg | Freq: Once | INTRAMUSCULAR | Status: AC
  Administered 2023-09-29: 30 mg via INTRAVENOUS
  Filled 2023-09-29: qty 2

## 2023-09-29 MED ORDER — SODIUM CHLORIDE 0.9% FLUSH: 10.0000 mL | INTRAVENOUS | Status: DC | PRN

## 2023-09-29 NOTE — Hospital Course (Signed)
 63 y.o. female with medical history significant of diabetes HTN, HLD, MG  with complaints of dysuria nausea.  Workup showed her UA was positive for UTI she has leukocytosis.  She was noted to have fevers and chills. Pt noted to be febrile. Started on empiric abx. Hospitalisst consulted for consideration for admiission

## 2023-09-29 NOTE — Plan of Care (Signed)

## 2023-09-29 NOTE — Progress Notes (Signed)
  Progress Note   Patient: Teresa Lee FMW:[id number] DOB: 05/16/1960 DOA: 09/28/2023     1 DOS: the patient was seen and examined on 09/29/2023   Brief hospital course: 63 y.o. female with medical history significant of diabetes HTN, HLD, MG  with complaints of dysuria nausea.  Workup showed her UA was positive for UTI she has leukocytosis.  She was noted to have fevers and chills. Pt noted to be febrile. Started on empiric abx. Hospitalisst consulted for consideration for admiission   Assessment and Plan: Myasthenia gravis:  -Seems stable -Cont on home meds     Left and right hip pain/osteoarthritis:  -PT/OT consulted -continue with analgesia as needed   New NIDDM-2 with hyperglycemia and hyperlipidemia:  -cont SSI as needed -Resume home meds once confirmed by  pharmacy.    HTN:  -BP remains well controlled -Cont home meds as tolerated   UTI with sepsis present on admit -Follow pan cultures -Thus far, urine cx is pos for >100,000 ecoli species -pending sensitivities -continued on empiric rocephin  -remains afebrile -Leukocytosis improving -Seems clinically improved   Obesity -recommend diet/lifestyle modification  Subjective: Overall feeling better. This AM, complaining of a mild headache  Physical Exam: Vitals:   09/29/23 0019 09/29/23 0427 09/29/23 0819 09/29/23 1143  BP: 104/62 104/66  (!) 100/56  Pulse: 74 70  74  Resp: 16 16    Temp: 98.4 F (36.9 C) 97.7 F (36.5 C)  98.2 F (36.8 C)  TempSrc: Oral Oral  Oral  SpO2: 94% 95% 96% 95%  Weight:      Height:       General exam: Awake, laying in bed, in nad Respiratory system: Normal respiratory effort, no wheezing Cardiovascular system: regular rate, s1, s2 Gastrointestinal system: Soft, nondistended, positive BS Central nervous system: CN2-12 grossly intact, strength intact Extremities: Perfused, no clubbing Skin: Normal skin turgor, no notable skin lesions seen Psychiatry: Mood normal // no  visual hallucinations   Data Reviewed:  Labs reviewed: Na 138, K 3.8, Cr 0.93, WBC 11.7, Hgb 10.6, Plts 183  Family Communication: Pt in room, family not at bedside  Disposition: Status is: Inpatient Remains inpatient appropriate because: severity of illness  Planned Discharge Destination: Home     Author: Garnette Pelt, MD 09/29/2023 5:44 PM  For on call review [email or web address].

## 2023-09-30 ENCOUNTER — Inpatient Hospital Stay (HOSPITAL_COMMUNITY)

## 2023-09-30 DIAGNOSIS — N3000 Acute cystitis without hematuria: Secondary | ICD-10-CM | POA: Diagnosis not present

## 2023-09-30 LAB — COMPREHENSIVE METABOLIC PANEL WITH GFR
ALT: 12 U/L (ref 0–44)
AST: 12 U/L — ABNORMAL LOW (ref 15–41)
Albumin: 2.8 g/dL — ABNORMAL LOW (ref 3.5–5.0)
Alkaline Phosphatase: 45 U/L (ref 38–126)
Anion gap: 10 (ref 5–15)
BUN: 28 mg/dL — ABNORMAL HIGH (ref 8–23)
CO2: 20 mmol/L — ABNORMAL LOW (ref 22–32)
Calcium: 8.6 mg/dL — ABNORMAL LOW (ref 8.9–10.3)
Chloride: 107 mmol/L (ref 98–111)
Creatinine, Ser: 1.22 mg/dL — ABNORMAL HIGH (ref 0.44–1.00)
GFR, Estimated: 50 mL/min — ABNORMAL LOW (ref >60)
Glucose, Bld: 166 mg/dL — ABNORMAL HIGH (ref 70–99)
Potassium: 4.7 mmol/L (ref 3.5–5.1)
Sodium: 137 mmol/L (ref 135–145)
Total Bilirubin: 0.6 mg/dL (ref 0.0–1.2)
Total Protein: 6 g/dL — ABNORMAL LOW (ref 6.5–8.1)

## 2023-09-30 LAB — GLUCOSE, CAPILLARY
Glucose-Capillary: 132 mg/dL — ABNORMAL HIGH (ref 70–99)
Glucose-Capillary: 172 mg/dL — ABNORMAL HIGH (ref 70–99)

## 2023-09-30 LAB — CBC
HCT: 32.9 % — ABNORMAL LOW (ref 36.0–46.0)
Hemoglobin: 10.8 g/dL — ABNORMAL LOW (ref 12.0–15.0)
MCH: 32.9 pg (ref 26.0–34.0)
MCHC: 32.8 g/dL (ref 30.0–36.0)
MCV: 100.3 fL — ABNORMAL HIGH (ref 80.0–100.0)
Platelets: 204 K/uL (ref 150–400)
RBC: 3.28 MIL/uL — ABNORMAL LOW (ref 3.87–5.11)
RDW: 15.2 % (ref 11.5–15.5)
WBC: 10.4 K/uL (ref 4.0–10.5)
nRBC: 0 % (ref 0.0–0.2)

## 2023-09-30 LAB — URINE CULTURE: Culture: >=100000 — AB

## 2023-09-30 MED ORDER — CEPHALEXIN 500 MG PO CAPS
500.0000 mg | ORAL_CAPSULE | Freq: Two times a day (BID) | ORAL | 0 refills | Status: AC
Start: 2023-10-01 — End: 2023-10-04

## 2023-09-30 NOTE — Evaluation (Signed)
 Physical Therapy Evaluation Only Patient Details Name: Teresa Lee MRN: [id number] DOB: 01/15/1961 Today's Date: 09/30/2023  History of Present Illness  Teresa Lee is a 63 yo female admitted with UTI. PMH: blind L eye, skin cancer, crohn's disease, fibromyalgia, GERD, HTN, IBS, myasthenia gravis, diabetes, spinal cord stimulator  Clinical Impression  Pt reports ind with rollator in the home and [facility] In the community. On eval BLE AROM WFL, strength 4+/5 throughout, completes transfers and amb without AD and no assistance, good steadiness, cadence WFL. Pt reports spouse retired at home with her 24/7, but she is ind at baseline. Pt reports past OPPT for fibromyalgia pain but co-pay was too high; aware of exercises and mobility, educated on aquatic exercises at local [employer] that may be more affordable and pt reports previously looking into it. No acute PT needs, no f/u recommended, will sign off at this time.      If plan is discharge home, recommend the following:     Can travel by private vehicle        Equipment Recommendations None recommended by PT  Recommendations for Other Services       Functional Status Assessment Patient has not had a recent decline in their functional status     Precautions / Restrictions Precautions Precaution/Restrictions Comments: blind L eye Restrictions Weight Bearing Restrictions Per Provider Order: No      Mobility  Bed Mobility               General bed mobility comments: OOB upon arrival    Transfers Overall transfer level: Independent Equipment used: None                    Ambulation/Gait Ambulation/Gait assistance: Independent Gait Distance (Feet): 200 Feet Assistive device: None Gait Pattern/deviations: WFL(Within Functional Limits) Gait velocity: WFL     General Gait Details: step through gait pattern, cadence WFL, able to compelte 180 degree turns and navigate room and obstacles in hallway without  LOB  Stairs            Wheelchair Mobility     Tilt Bed    Modified Rankin (Stroke Patients Only)       Balance Overall balance assessment: No apparent balance deficits (not formally assessed)                                           Pertinent Vitals/Pain Pain Assessment Pain Assessment: Faces Faces Pain Scale: Hurts a little bit Pain Location:  (I have fibromyalgia)    Home Living Family/patient expects to be discharged to:: Private residence Living Arrangements: Spouse/significant other Available Help at Discharge: Family;Available 24 hours/day Type of Home: House Home Access: Stairs to enter   [employer] of Steps: 2   Home Layout: Able to live on main level with bedroom/bathroom;Two level Home Equipment: Agricultural consultant (2 wheels);Cane - single point;Grab bars - tub/shower;Tub bench;Rollator (4 wheels)      Prior Function Prior Level of Function : Independent/Modified Independent             Mobility Comments: pt reports ind with rollator in the home, [facility]PC in the community ADLs Comments: pt reports ind at baseline     Extremity/Trunk Assessment   Upper Extremity Assessment Upper Extremity Assessment: Overall WFL for tasks assessed    Lower Extremity Assessment Lower Extremity Assessment: Overall WFL for tasks assessed  Cervical / Trunk Assessment Cervical / Trunk Assessment: Normal  Communication   Communication Communication: No apparent difficulties    Cognition Arousal: Alert Behavior During Therapy: WFL for tasks assessed/performed   PT - Cognitive impairments: No apparent impairments                         Following commands: Intact       Cueing       General Comments      Exercises     Assessment/Plan    PT Assessment Patient does not need any further PT services  PT Problem List         PT Treatment Interventions      PT Goals (Current goals can be found in the Care  Plan section)  Acute Rehab PT Goals Patient Stated Goal: return home PT Goal Formulation: All assessment and education complete, DC therapy    Frequency       Co-evaluation               AM-PAC PT 6 Clicks Mobility  Outcome Measure Help needed turning from your back to your side while in a flat bed without using bedrails?: None Help needed moving from lying on your back to sitting on the side of a flat bed without using bedrails?: None Help needed moving to and from a bed to a chair (including a wheelchair)?: None Help needed standing up from a chair using your arms (e.g., wheelchair or bedside chair)?: None Help needed to walk in hospital room?: None Help needed climbing 3-5 steps with a railing? : None 6 Click Score: 24    End of Session   Activity Tolerance: Patient tolerated treatment well Patient left: in bed;with call bell/phone within reach;with nursing/sitter in room Nurse Communication: Mobility status PT Visit Diagnosis: Pain Pain - part of body: Hip    Time: 9066-9053 PT Time Calculation (min) (ACUTE ONLY): 13 min   Charges:   PT Evaluation $PT Eval Low Complexity: 1 Low   PT General Charges $$ ACUTE PT VISIT: 1 Visit         Tori Jenevie Casstevens PT, DPT 09/30/23, 9:55 AM

## 2023-09-30 NOTE — Discharge Summary (Signed)
 Physician Discharge Summary   Patient: Teresa Lee MRN: [id number] DOB: 03-14-1961  Admit date:     09/28/2023  Discharge date: 09/30/23  Discharge Physician: Garnette Pelt   PCP: Sophronia Ozell BROCKS, MD   Recommendations at discharge:    Follow up with PCP in 1-2 weeks  Discharge Diagnoses: Principal Problem:   UTI (urinary tract infection)  Resolved Problems:   * No resolved hospital problems. *  Hospital Course: 63 y.o. female with medical history significant of diabetes HTN, HLD, MG  with complaints of dysuria nausea.  Workup showed her UA was positive for UTI she has leukocytosis.  She was noted to have fevers and chills. Pt noted to be febrile. Started on empiric abx. Hospitalisst consulted for consideration for admiission   Assessment and Plan: Myasthenia gravis:  -Seems stable -Cont on home meds    Left and right hip pain/osteoarthritis:  -PT/OT consulted -continue with home analgesia as needed   New NIDDM-2 with hyperglycemia and hyperlipidemia:  -cont SSI as needed -Resume home meds on discharge. Remained stable   HTN:  -BP remains well controlled -Cont home meds as tolerated   Pan-sensitive Ecoli UTI with sepsis present on admit -Follow pan cultures -Thus far, urine cx is pos for >100,000 ecoli species, pan-sensitive -was initially continued on empiric rocephin  with no reactions -remained afebrile and leukocytosis improving -clinically improved -Discharged on keflex  to complete course. Pt states she has tolerated keflex  in the past with no issues   Obesity -recommend diet/lifestyle modification   Headache -Neurologically intact. Ambulated freely with PT in hallway without issues -Kernig and Brudzinski signs neg -Reported improvement with one dose of toradol . Would avoid continuing NSAID given renal function -Cont muscle relaxant and heat -CT head ordered and reviewed. Negative for acute process. 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 going back at least several years, per my review   Consultants:  Procedures performed:   Disposition: Home Diet recommendation:  Regular diet DISCHARGE MEDICATION: Allergies as of 09/30/2023       Reactions   Cefixime Other (See Comments), Hives, Rash   Due to myasthenia gravis   Citrus Dermatitis   Causes burning of skin and blisters   Duloxetine Nausea Only, Other (See Comments)   Cymbalta    Other Hives, Nausea Only, Dermatitis, Rash, Other (See Comments)   Some nuts cause RASHES, too- some are tolerated   Rosuvastatin  Other (See Comments)   Rhabdomyolysis   Tapentadol Other (See Comments)   Headaches (Nucynta)   Latex Other (See Comments)   BLISTERS   Lorazepam  Other (See Comments)   Hallucinations   Orange Concentrate Quarry manager (non-screening)] Other (See Comments)   Acid reflux   Oxycodone  Hcl Hives   Percocet [oxycodone -acetaminophen ] Hives   Sulfa Antibiotics Hives, Nausea And Vomiting   Diazepam Other (See Comments)   Hallucinations   Statins Other (See Comments)   Statin induced myositis from rosuvastatin . DO NOT Attempt to try other statins.   Tape Other (See Comments)   BLISTERS and BURNS THE SKIN   Bioflavonoids Rash   Causes burning of skin and blisters        Medication List     STOP taking these medications    Dexcom G7 Receiver Devi   hydrALAZINE  25 MG tablet Commonly known as: APRESOLINE    loperamide  2 MG capsule Commonly known as: IMODIUM    meloxicam  15 MG tablet Commonly known as: MOBIC   TAKE these medications    AeroChamber Plus inhaler Use as instructed to use with inahaler.   albuterol  108 (90 Base) MCG/ACT inhaler Commonly known as: VENTOLIN  HFA 1 PUFF EVERY 6 HOURS AS NEEDED FOR WHEEZING What changed: See the new instructions.   albuterol  (2.5 MG/3ML) 0.083% nebulizer solution Commonly known as: PROVENTIL  Take 2.5 mg by nebulization every 6 (six) hours as needed  for shortness of breath or wheezing. What changed: Another medication with the same name was changed. Make sure you understand how and when to take each.   Blood Glucose Monitoring Suppl Devi 1 each by Does not apply route in the morning, at noon, and at bedtime. May substitute to any manufacturer covered by patient's insurance.   cephALEXin  500 MG capsule Commonly known as: KEFLEX  Take 1 capsule (500 mg total) by mouth 2 (two) times daily for 3 days. Start taking on: October 01, 2023   cyanocobalamin  1000 MCG/ML injection Commonly known as: VITAMIN B12 Inject 1,000 mcg into the muscle See admin instructions. Inject 1,000 mcg into the muscle every 6-8 weeks   desmopressin  0.2 MG tablet Commonly known as: DDAVP  Take 0.2 mg by mouth at bedtime.   estradiol  2 MG tablet Commonly known as: ESTRACE  Take 2 mg by mouth at bedtime.   ezetimibe 10 MG tablet Commonly known as: ZETIA Take 10 mg by mouth daily.   glimepiride 1 MG tablet Commonly known as: AMARYL Take 1 mg by mouth daily with breakfast.   HYDROcodone -acetaminophen  10-325 MG tablet Commonly known as: NORCO Take 1 tablet by mouth every 4 (four) hours.   hydrocortisone  2.5 % rectal cream Commonly known as: ANUSOL -HC Place 1 Application rectally 2 (two) times daily.   insulin  aspart 100 UNIT/ML FlexPen Commonly known as: NOVOLOG  CBG 70 - 120: 0 units  CBG 121 - 150: 2 units  CBG 151 - 200: 3 units  CBG 201 - 250: 5 units  CBG 251 - 300: 8 units  CBG 301 - 350: 11 units  CBG 351 - 400: 15 units   insulin  glargine 100 UNIT/ML Solostar Pen Commonly known as: LANTUS  Inject 25 Units into the skin at bedtime.   losartan  100 MG tablet Commonly known as: COZAAR  Take 100 mg by mouth daily.   Mesalamine  400 MG Cpdr DR capsule Commonly known as: ASACOL  Take 800 mg by mouth in the morning and at bedtime.   methocarbamol  500 MG tablet Commonly known as: ROBAXIN  Take 500-1,000 mg by mouth See admin instructions. Take  500-1,000 mg by mouth at bedtime and an additional 500 mg once a day as needed for muscle spasms   metoprolol  succinate 25 MG 24 hr tablet Commonly known as: TOPROL -XL Take 25 mg by mouth at bedtime.   mycophenolate  500 MG tablet Commonly known as: CELLCEPT  Take 1,000 mg by mouth in the morning and at bedtime.   naloxone  4 MG/0.1ML Liqd nasal spray kit Commonly known as: NARCAN  Place 1 spray into the nose once as needed (opioid overdose).   omega-3 acid ethyl esters 1 g capsule Commonly known as: LOVAZA  Take 1 g by mouth 4 (four) times daily.   ondansetron  4 MG tablet Commonly known as: ZOFRAN  Take 4 mg by mouth every 8 (eight) hours as needed for nausea or vomiting.   pantoprazole  40 MG tablet Commonly known as: PROTONIX  TAKE 1 TABLET DAILY What changed: when to take this   PEN NEEDLES 29GX1/2 29G X Misc 100 Units by Does not apply route 3 (three) times daily  before meals.   Pen Needles 32G X 4 MM Misc 100 Units by Does not apply route 3 (three) times daily before meals.   pioglitazone 15 MG tablet Commonly known as: ACTOS Take 15 mg by mouth daily.   potassium chloride  SA 20 MEQ tablet Commonly known as: KLOR-CON  M Take 20 mEq by mouth in the morning and at bedtime.   predniSONE  10 MG tablet Commonly known as: DELTASONE  Prednisone  50 mg daily for 3 days followed by  Prednisone  40 mg daily for 5 days followed by  Prednisone  20 mg daily for 5 days followed by  Prednisone  10 mg daily What changed:  how much to take how to take this when to take this additional instructions   pregabalin  100 MG capsule Commonly known as: LYRICA  Take 100 mg by mouth at bedtime.   progesterone  100 MG capsule Commonly known as: PROMETRIUM  Take 100 mg by mouth at bedtime.   pyridostigmine  60 MG tablet Commonly known as: MESTINON  Take 30 mg by mouth 3 (three) times daily as needed (for Myasthenia gravis symptoms).   rOPINIRole  1 MG tablet Commonly known as: REQUIP  Take  1-2 tablets (1-2 mg total) by mouth at bedtime. What changed: when to take this   solifenacin 10 MG tablet Commonly known as: VESICARE Take 10 mg by mouth at bedtime.   spironolactone  25 MG tablet Commonly known as: ALDACTONE  Take 25 mg by mouth daily.   Symbicort  80-4.5 MCG/ACT inhaler Generic drug: budesonide -formoterol  Inhale 2 puffs into the lungs 2 (two) times daily. What changed: when to take this   Vitamin D  (Ergocalciferol ) 1.25 MG (50000 UNIT) Caps capsule Commonly known as: DRISDOL  Take 50,000 Units by mouth every 14 (fourteen) days.        Follow-up Information     Sophronia Ozell BROCKS, MD Follow up in 2 week(s).   Specialty: Family Medicine Why: Hospital follow up Contact information: [address] [place] [address] [phone]                Discharge Exam: Fredricka Weights   09/28/23 1100 09/28/23 1105  Weight: 73.9 kg 73.9 kg   General exam: Awake, laying in bed, in nad Respiratory system: Normal respiratory effort, no wheezing Cardiovascular system: regular rate, s1, s2 Gastrointestinal system: Soft, nondistended, positive BS Central nervous system: CN2-12 grossly intact, strength intact Extremities: Perfused, no clubbing Skin: Normal skin turgor, no notable skin lesions seen Psychiatry: Mood normal // no visual hallucinations   Condition at discharge: fair  The results of significant diagnostics from this hospitalization (including imaging, microbiology, ancillary and laboratory) are listed below for reference.   Imaging Studies: CT HEAD WO CONTRAST ( ) Result Date: 09/30/2023 CLINICAL DATA:  Headache and fever EXAM: CT HEAD WITHOUT CONTRAST TECHNIQUE: Contiguous axial images were obtained from the base of the skull through the vertex without intravenous contrast. RADIATION DOSE REDUCTION: This exam was performed according to the departmental dose-optimization program which includes automated exposure control, adjustment  of the mA and/or kV according to patient size and/or use of iterative reconstruction technique. COMPARISON:  07/10/2022 FINDINGS: Brain: No focal abnormality seen affecting the brainstem or cerebellum. The left cerebral hemisphere again shows evidence of mild small vessel change of the white without evidence of acute stroke. 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 Deformity of the frontal horn of the lateral ventricle associated with this. Absent septum pellucidum as seen previously. No sign of acute stroke,  mass, hemorrhage, hydrocephalus or extra-axial collection. No finding to suggest meningitis. Vascular: There is atherosclerotic calcification of the major vessels at the base of the brain. Skull: Negative Sinuses/Orbits: Clear/normal.  Previous FESS. Other: None IMPRESSION: 1. No acute CT finding. No evidence of meningitis. 2. 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 Absent septum pellucidum. 3. Mild small vessel change of the white matter of the left cerebral hemisphere. Electronically Signed   By: Oneil Officer M.D.   On: 09/30/2023 15:23   DG Chest Portable 1 View Result Date: 09/28/2023 CLINICAL DATA:  Fever EXAM: PORTABLE CHEST 1 VIEW COMPARISON:  June 03, 2020 FINDINGS: The heart size and mediastinal contours are within normal limits. Both lungs are clear. Status post sternotomy. IMPRESSION: No active disease. Electronically Signed   By: Michaeline Blanch M.D.   On: 09/28/2023 11:48    Microbiology: Results for orders placed or performed during the hospital encounter of 09/28/23  Resp panel by RT-PCR (RSV, Flu A&B, Covid) Anterior Nasal Swab     Status: None   Collection Time: 09/28/23 11:09 AM   Specimen: Anterior Nasal Swab  Result Value Ref Range Status   SARS Coronavirus 2 by RT PCR NEGATIVE NEGATIVE Final    Comment: (NOTE) SARS-CoV-2 target nucleic acids are NOT DETECTED.  The  SARS-CoV-2 RNA is generally detectable in upper respiratory specimens during the acute phase of infection. The lowest concentration of SARS-CoV-2 viral copies this assay can detect is 138 copies/mL. A negative result does not preclude SARS-Cov-2 infection and should not be used as the sole basis for treatment or other patient management decisions. A negative result may occur with  improper specimen collection/handling, submission of specimen other than nasopharyngeal swab, presence of viral mutation(s) within the areas targeted by this assay, and inadequate number of viral copies(<138 copies/mL). A negative result must be combined with clinical observations, patient history, and epidemiological information. The expected result is Negative.  Fact Sheet for Patients:  BloggerCourse.com  Fact Sheet for Healthcare Providers:  SeriousBroker.it  This test is no t yet approved or cleared by the [place]  FDA and  has been authorized for detection and/or diagnosis of SARS-CoV-2 by FDA under an Emergency Use Authorization (EUA). This EUA will remain  in effect (meaning this test can be used) for the duration of the COVID-19 declaration under Section 564(b)(1) of the Act, 21 U.S.C.section 360bbb-3(b)(1), unless the authorization is terminated  or revoked sooner.       Influenza A by PCR NEGATIVE NEGATIVE Final   Influenza B by PCR NEGATIVE NEGATIVE Final    Comment: (NOTE) The Xpert Xpress SARS-CoV-2/FLU/RSV plus assay is intended as an aid in the diagnosis of influenza from Nasopharyngeal swab specimens and should not be used as a sole basis for treatment. Nasal washings and aspirates are unacceptable for Xpert Xpress SARS-CoV-2/FLU/RSV testing.  Fact Sheet for Patients: BloggerCourse.com  Fact Sheet for Healthcare Providers: SeriousBroker.it  This test is not yet approved or  cleared by the [place]  FDA and has been authorized for detection and/or diagnosis of SARS-CoV-2 by FDA under an Emergency Use Authorization (EUA). This EUA will remain in effect (meaning this test can be used) for the duration of the COVID-19 declaration under Section 564(b)(1) of the Act, 21 U.S.C. section 360bbb-3(b)(1), unless the authorization is terminated or revoked.     Resp Syncytial Virus by PCR NEGATIVE NEGATIVE Final    Comment: (NOTE) Fact Sheet for Patients: BloggerCourse.com  Fact Sheet for Healthcare  Providers: SeriousBroker.it  This test is not yet approved or cleared by the [place]  FDA and has been authorized for detection and/or diagnosis of SARS-CoV-2 by FDA under an Emergency Use Authorization (EUA). This EUA will remain in effect (meaning this test can be used) for the duration of the COVID-19 declaration under Section 564(b)(1) of the Act, 21 U.S.C. section 360bbb-3(b)(1), unless the authorization is terminated or revoked.  Performed at [facility], [address] [place], [place] [address]   Culture, blood (routine x 2)     Status: None (Preliminary result)   Collection Time: 09/28/23 12:10 PM   Specimen: BLOOD  Result Value Ref Range Status   Specimen Description   Final    BLOOD LEFT ANTECUBITAL Performed at [facility], [address] [place], [place] [address]    Special Requests   Final    BOTTLES DRAWN AEROBIC AND ANAEROBIC Blood Culture adequate volume Performed at [facility], [address] [place], [place] [address]    Culture   Final    NO GROWTH 2 DAYS Performed at [facility], [address], [place], [place] [address]    Report Status PENDING  Incomplete  Urine Culture     Status: Abnormal   Collection Time: 09/28/23 12:14 PM   Specimen: Urine, Random  Result Value Ref Range Status   Specimen Description   Final    URINE,  RANDOM Performed at [facility], [address] [place], [place] [address]    Special Requests   Final    NONE Reflexed from [id number] Performed at [facility], [address] [place], [place] [address]    Culture >=100,000 COLONIES/mL ESCHERICHIA COLI (A)  Final   Report Status 09/30/2023 FINAL  Final   Organism ID, Bacteria ESCHERICHIA COLI (A)  Final      Susceptibility   Escherichia coli - MIC*    AMPICILLIN <=2 SENSITIVE Sensitive     CEFAZOLIN  <=4 SENSITIVE Sensitive     CEFEPIME  <=0.12 SENSITIVE Sensitive     CEFTRIAXONE  <=0.25 SENSITIVE Sensitive     CIPROFLOXACIN <=0.25 SENSITIVE Sensitive     GENTAMICIN <=1 SENSITIVE Sensitive     IMIPENEM <=0.25 SENSITIVE Sensitive     NITROFURANTOIN <=16 SENSITIVE Sensitive     TRIMETH/SULFA <=20 SENSITIVE Sensitive     AMPICILLIN/SULBACTAM <=2 SENSITIVE Sensitive     PIP/TAZO <=4 SENSITIVE Sensitive ug/mL    * >=100,000 COLONIES/mL ESCHERICHIA COLI  Culture, blood (routine x 2)     Status: None (Preliminary result)   Collection Time: 09/28/23  2:20 PM   Specimen: BLOOD RIGHT FOREARM  Result Value Ref Range Status   Specimen Description   Final    BLOOD RIGHT FOREARM Performed at [facility], [address] [place], [place] [address]    Special Requests   Final    BOTTLES DRAWN AEROBIC AND ANAEROBIC Blood Culture adequate volume Performed at [facility], [address] [place], [place] [address]    Culture   Final    NO GROWTH 2 DAYS Performed at [facility], [address], [place], [place] [address]    Report Status PENDING  Incomplete    Labs: CBC: Recent Labs  Lab 09/28/23 1214 09/28/23 1510 09/29/23 0512 09/30/23 0254  WBC 16.2*  --  11.7* 10.4  HGB 12.6 10.5* 10.6* 10.8*  HCT 36.2 31.0* 32.6* 32.9*  MCV 95.8  --  100.0 100.3*  PLT 207  --  183 204   Basic Metabolic Panel: Recent Labs  Lab 09/28/23 1214 09/28/23 1510 09/29/23 0512 09/30/23 0254  NA  137 136 138 137  K 4.3 4.8 3.8 4.7  CL 103  --  107 107  CO2 17*  --  20* 20*  GLUCOSE 136*  --  117* 166*  BUN 21  --  22 28*  CREATININE 1.25*  --  0.93 1.22*  CALCIUM  9.2  --  8.7* 8.6*   Liver Function Tests: Recent Labs  Lab 09/28/23 1214 09/29/23 0512 09/30/23 0254  AST 19 10* 12*  ALT 14 13 12   ALKPHOS 68 46 45  BILITOT 0.9 0.9 0.6  PROT 6.6 5.6* 6.0*  ALBUMIN  3.6 2.6* 2.8*   CBG: Recent Labs  Lab 09/29/23 1144 09/29/23 1605 09/29/23 2034 09/30/23 0724 09/30/23 1130  GLUCAP 155* 178* 224* 132* 172*    Discharge time spent: less than 30 minutes.  Signed: Garnette Pelt, MD [facility] 09/30/2023

## 2023-10-01 LAB — HEMOGLOBIN A1C
Hgb A1c MFr Bld: 5.9 % — ABNORMAL HIGH (ref 4.8–5.6)
Mean Plasma Glucose: 123 mg/dL

## 2023-10-03 LAB — CULTURE, BLOOD (ROUTINE X 2)
Culture: NO GROWTH
Culture: NO GROWTH
Special Requests: ADEQUATE
Special Requests: ADEQUATE

## 2023-11-01 ENCOUNTER — Encounter: Payer: Self-pay | Admitting: Hematology & Oncology

## 2023-11-01 ENCOUNTER — Inpatient Hospital Stay: Admitting: Hematology & Oncology

## 2023-11-01 ENCOUNTER — Inpatient Hospital Stay: Attending: Hematology & Oncology

## 2023-11-01 VITALS — BP 141/80 | HR 95 | Temp 97.8°F | Resp 19 | Ht 60.0 in | Wt 165.0 lb

## 2023-11-01 DIAGNOSIS — K5090 Crohn's disease, unspecified, without complications: Secondary | ICD-10-CM | POA: Diagnosis not present

## 2023-11-01 DIAGNOSIS — K5000 Crohn's disease of small intestine without complications: Secondary | ICD-10-CM

## 2023-11-01 DIAGNOSIS — D72823 Leukemoid reaction: Secondary | ICD-10-CM

## 2023-11-01 DIAGNOSIS — J45909 Unspecified asthma, uncomplicated: Secondary | ICD-10-CM | POA: Diagnosis not present

## 2023-11-01 DIAGNOSIS — E119 Type 2 diabetes mellitus without complications: Secondary | ICD-10-CM | POA: Insufficient documentation

## 2023-11-01 DIAGNOSIS — M797 Fibromyalgia: Secondary | ICD-10-CM

## 2023-11-01 DIAGNOSIS — K589 Irritable bowel syndrome without diarrhea: Secondary | ICD-10-CM | POA: Insufficient documentation

## 2023-11-01 DIAGNOSIS — Z803 Family history of malignant neoplasm of breast: Secondary | ICD-10-CM | POA: Diagnosis not present

## 2023-11-01 DIAGNOSIS — E538 Deficiency of other specified B group vitamins: Secondary | ICD-10-CM

## 2023-11-01 DIAGNOSIS — G7000 Myasthenia gravis without (acute) exacerbation: Secondary | ICD-10-CM | POA: Insufficient documentation

## 2023-11-01 DIAGNOSIS — D72829 Elevated white blood cell count, unspecified: Secondary | ICD-10-CM

## 2023-11-01 LAB — SAVE SMEAR(SSMR), FOR PROVIDER SLIDE REVIEW

## 2023-11-01 LAB — CBC WITH DIFFERENTIAL (CANCER CENTER ONLY)
Abs Immature Granulocytes: 0.83 K/uL — ABNORMAL HIGH (ref 0.00–0.07)
Basophils Absolute: 0.2 K/uL — ABNORMAL HIGH (ref 0.0–0.1)
Basophils Relative: 2 %
Eosinophils Absolute: 0 K/uL (ref 0.0–0.5)
Eosinophils Relative: 0 %
HCT: 42.2 % (ref 36.0–46.0)
Hemoglobin: 14.2 g/dL (ref 12.0–15.0)
Immature Granulocytes: 6 %
Lymphocytes Relative: 9 %
Lymphs Abs: 1.3 K/uL (ref 0.7–4.0)
MCH: 33 pg (ref 26.0–34.0)
MCHC: 33.6 g/dL (ref 30.0–36.0)
MCV: 98.1 fL (ref 80.0–100.0)
Monocytes Absolute: 0.3 K/uL (ref 0.1–1.0)
Monocytes Relative: 2 %
Neutro Abs: 11.3 K/uL — ABNORMAL HIGH (ref 1.7–7.7)
Neutrophils Relative %: 81 %
Platelet Count: 209 K/uL (ref 150–400)
RBC: 4.3 MIL/uL (ref 3.87–5.11)
RDW: 14.6 % (ref 11.5–15.5)
Smear Review: NORMAL
WBC Count: 14 K/uL — ABNORMAL HIGH (ref 4.0–10.5)
nRBC: 0 % (ref 0.0–0.2)

## 2023-11-01 LAB — CMP (CANCER CENTER ONLY)
ALT: 14 U/L (ref 0–44)
AST: 19 U/L (ref 15–41)
Albumin: 4 g/dL (ref 3.5–5.0)
Alkaline Phosphatase: 44 U/L (ref 38–126)
Anion gap: 16 — ABNORMAL HIGH (ref 5–15)
BUN: 21 mg/dL (ref 8–23)
CO2: 17 mmol/L — ABNORMAL LOW (ref 22–32)
Calcium: 9.5 mg/dL (ref 8.9–10.3)
Chloride: 101 mmol/L (ref 98–111)
Creatinine: 1.05 mg/dL — ABNORMAL HIGH (ref 0.44–1.00)
GFR, Estimated: >60 mL/min (ref >60)
Glucose, Bld: 230 mg/dL — ABNORMAL HIGH (ref 70–99)
Potassium: 4.3 mmol/L (ref 3.5–5.1)
Sodium: 134 mmol/L — ABNORMAL LOW (ref 135–145)
Total Bilirubin: 0.4 mg/dL (ref 0.0–1.2)
Total Protein: 6.8 g/dL (ref 6.5–8.1)

## 2023-11-01 LAB — LACTATE DEHYDROGENASE: LDH: 232 U/L — ABNORMAL HIGH (ref 98–192)

## 2023-11-01 LAB — VITAMIN B12: Vitamin B-12: 363 pg/mL (ref 180–914)

## 2023-11-01 NOTE — Progress Notes (Signed)
 Referral MD  Reason for Referral: Leukocytosis-likely leukemoid reaction  Chief Complaint  Patient presents with   New Patient (Initial Visit)  : My white blood cell count has been high  HPI: Ms. Winfree is a very charming 63 year old white female.  She has she is related to several of our patients who have hemochromatosis.  She does not know if she has hemochromatosis.  We will certainly try to check this out.  However, she has multiple, multiple other health issues.  She has had myasthenia gravis.  She has Crohn's disease.  She has asthma.  She has fibromyalgia.  She has had back surgery.  She has irritable bowel.  She has diabetes.  She was diagnosed with myasthenia gravis when she was 63 years old.  I think she had her thymus taken out..  She has Crohn's disease.  She has had multiple surgeries.  She has had chronic mild leukocytosis.  She has been on longstanding steroid use..  She had rhabdomyolysis.  This was probably secondary to the use of statin drugs.  Going back to 2011, her white cell count was 14,000.  In 2014, her white cell count was 15,100.  In 2018, her white cell count was 17,300.  She has had some element of anemia.  She never had thrombocytosis.  With her white cell differential, she is always had some neutrophilia and lymphopenia.  She has had COVID before.  She was hospitalized for 11 days.  She does not smoke.  She does not have adult beverages.  Again, there is a family history of multiple health issues including hemochromatosis, breast cancer, melanoma.  I can see that she had a CT of the abdomen pelvis back in April 2024.  There is no splenomegaly at that time.  She has not noted any obvious change in bowel or bladder habits.  She does have routine colonoscopies because of the Crohn's disease.  I think her last mammogram was back in November 2024.  She has had no bleeding.  She has had no rashes.  She has had no weight loss or weight gain.  Overall, I  would have to say that her performance status is probably ECOG 1.   Past Medical History:  Diagnosis Date   Anxiety    Arthritis    osteoarthritis   Asthma    Blind left eye    Cancer (HCC)    skin   Crohn's disease (HCC)    CTS (carpal tunnel syndrome)    DJD (degenerative joint disease)    Both cervical spine and LS spine   Fibromyalgia    GERD (gastroesophageal reflux disease)    Heart murmur    Herniated nucleus pulposus    Hyperlipidemia    Hypertension    IBS (irritable bowel syndrome)    Leukocytosis    Myasthenia gravis    Myasthenia gravis (HCC)    Myasthenia gravis (HCC)    Non-insulin  dependent type 2 diabetes mellitus (HCC) 07/13/2022   Pneumonia    Sleep apnea    Spinal cord stimulator status   :   Past Surgical History:  Procedure Laterality Date   BACK SURGERY     BIOPSY  05/24/2020   Procedure: BIOPSY;  Surgeon: Donnald Charleston, MD;  Location: [facility] ENDOSCOPY;  Service: Endoscopy;;   BREAST REDUCTION SURGERY     CARPAL TUNNEL RELEASE     bilateral   cervical disc inj     CHOLECYSTECTOMY     COLONOSCOPY WITH PROPOFOL  N/A 05/24/2020  Procedure: COLONOSCOPY WITH PROPOFOL ;  Surgeon: Donnald Charleston, MD;  Location: [facility] ENDOSCOPY;  Service: Endoscopy;  Laterality: N/A;   ENDOMETRIAL ABLATION W/ NOVASURE     ESOPHAGOGASTRODUODENOSCOPY (EGD) WITH PROPOFOL  N/A 05/24/2020   Procedure: ESOPHAGOGASTRODUODENOSCOPY (EGD) WITH PROPOFOL ;  Surgeon: Donnald Charleston, MD;  Location: [facility] ENDOSCOPY;  Service: Endoscopy;  Laterality: N/A;   LUMBAR LAMINECTOMY/DECOMPRESSION MICRODISCECTOMY Right 05/04/2020   Procedure: Right Lumbar Five Sacral One Microdiscectomy;  Surgeon: Unice Pac, MD;  Location: [facility] OR;  Service: Neurosurgery;  Laterality: Right;  posterior   MELANOMA EXCISION     MUSCLE BIOPSY Left 07/17/2022   Procedure: LEFT THIGH MUSCLE BIOPSY;  Surgeon: Rubin Calamity, MD;  Location[facility] OR;  Service: General;  Laterality: Left;   NASAL SINUS SURGERY     SPINAL CORD  STIMULATOR INSERTION  2019   SPINAL CORD STIMULATOR INSERTION N/A 10/18/2021   Procedure: REMOVAL OF  SPINAL CORD STIMULATOR;  Surgeon: Dawley, Lani BROCKS, DO;  Location: [facility] OR;  Service: Neurosurgery;  Laterality: N/A;  3C   THYMECTOMY     due to myastenia gravis   THYMECTOMY     TRANSFORAMINAL LUMBAR INTERBODY FUSION (TLIF) WITH PEDICLE SCREW FIXATION 1 LEVEL Right 05/04/2020   Procedure: Right Lumbar Four-Five Transforaminal lumbar interbody fusion;  Surgeon: Unice Pac, MD;  Location: [facility] OR;  Service: Neurosurgery;  Laterality: Right;  posterior  :   Current Outpatient Medications:    albuterol  (PROVENTIL ) (2.5 MG/3ML) 0.083% nebulizer solution, Take 2.5 mg by nebulization every 6 (six) hours as needed for shortness of breath or wheezing., Disp: , Rfl:    albuterol  (VENTOLIN  HFA) 108 (90 Base) MCG/ACT inhaler, 1 PUFF EVERY 6 HOURS AS NEEDED FOR WHEEZING, Disp: 18 g, Rfl: 2   cyanocobalamin  (,VITAMIN B-12,) 1000 MCG/ML injection, Inject 1,000 mcg into the muscle See admin instructions. Inject 1,000 mcg into the muscle every 6-8 weeks, Disp: , Rfl:    desmopressin  (DDAVP ) 0.2 MG tablet, Take 0.2 mg by mouth at bedtime. (Patient taking differently: Take 0.1 mg by mouth at bedtime.), Disp: , Rfl:    estradiol  (ESTRACE ) 2 MG tablet, Take 2 mg by mouth at bedtime., Disp: , Rfl:    ezetimibe (ZETIA) 10 MG tablet, Take 10 mg by mouth daily., Disp: , Rfl:    glimepiride (AMARYL) 1 MG tablet, Take 1 mg by mouth daily with breakfast., Disp: , Rfl:    HYDROcodone -acetaminophen  (NORCO) 10-325 MG tablet, Take 1 tablet by mouth every 4 (four) hours., Disp: , Rfl:    losartan  (COZAAR ) 100 MG tablet, Take 100 mg by mouth daily., Disp: , Rfl:    Mesalamine  (ASACOL ) 400 MG CPDR DR capsule, Take 800 mg by mouth in the morning and at bedtime. (Patient taking differently: Take 400 mg by mouth in the morning and at bedtime.), Disp: , Rfl:    methocarbamol  (ROBAXIN ) 500 MG tablet, Take 500-1,000 mg by mouth See admin  instructions. Take 500-1,000 mg by mouth at bedtime and an additional 500 mg once a day as needed for muscle spasms, Disp: , Rfl:    mycophenolate  (CELLCEPT ) 500 MG tablet, Take 1,000 mg by mouth in the morning and at bedtime., Disp: , Rfl:    naloxone  (NARCAN ) nasal spray 4 mg/0.1 mL, Place 1 spray into the nose once as needed (opioid overdose)., Disp: , Rfl:    omega-3 acid ethyl esters (LOVAZA ) 1 g capsule, Take 1 g by mouth 4 (four) times daily. (Patient taking differently: Take 1 g by mouth 2 (two) times daily.), Disp: ,  Rfl:    ondansetron  (ZOFRAN ) 4 MG tablet, Take 4 mg by mouth every 8 (eight) hours as needed for nausea or vomiting., Disp: , Rfl:    pantoprazole  (PROTONIX ) 40 MG tablet, TAKE 1 TABLET DAILY, Disp: 90 tablet, Rfl: 3   pioglitazone (ACTOS) 15 MG tablet, Take 15 mg by mouth daily., Disp: , Rfl:    potassium chloride  SA (KLOR-CON  M) 20 MEQ tablet, Take 20 mEq by mouth in the morning and at bedtime. (Patient taking differently: Take 20 mEq by mouth daily.), Disp: , Rfl:    predniSONE  (DELTASONE ) 10 MG tablet, Prednisone  50 mg daily for 3 days followed by  Prednisone  40 mg daily for 5 days followed by  Prednisone  20 mg daily for 5 days followed by  Prednisone  10 mg daily (Patient taking differently: Take 10 mg by mouth daily with breakfast.), Disp: 45 tablet, Rfl: 0   pregabalin  (LYRICA ) 100 MG capsule, Take 100 mg by mouth at bedtime., Disp: , Rfl:    progesterone  (PROMETRIUM ) 100 MG capsule, Take 100 mg by mouth at bedtime., Disp: , Rfl:    pyridostigmine  (MESTINON ) 60 MG tablet, Take 30 mg by mouth 3 (three) times daily as needed (for Myasthenia gravis symptoms)., Disp: , Rfl:    rOPINIRole  (REQUIP ) 1 MG tablet, Take 1-2 tablets (1-2 mg total) by mouth at bedtime., Disp: 180 tablet, Rfl: 1   solifenacin (VESICARE) 10 MG tablet, Take 10 mg by mouth at bedtime., Disp: , Rfl:    Spacer/Aero-Holding Chambers (AEROCHAMBER PLUS) inhaler, Use as instructed to use with inahaler., Disp: 1  each, Rfl: 1   spironolactone  (ALDACTONE ) 25 MG tablet, Take 25 mg by mouth daily., Disp: , Rfl:    SYMBICORT  80-4.5 MCG/ACT inhaler, Inhale 2 puffs into the lungs 2 (two) times daily. (Patient taking differently: Inhale 2 puffs into the lungs in the morning and at bedtime.), Disp: 10.2 g, Rfl: 6   Vitamin D , Ergocalciferol , (DRISDOL ) 1.25 MG (50000 UNIT) CAPS capsule, Take 50,000 Units by mouth every 14 (fourteen) days., Disp: , Rfl:    Blood Glucose Monitoring Suppl DEVI, 1 each by Does not apply route in the morning, at noon, and at bedtime. May substitute to any manufacturer covered by patient's insurance., Disp: 1 each, Rfl: 0   hydrocortisone  (ANUSOL -HC) 2.5 % rectal cream, Place 1 Application rectally 2 (two) times daily. (Patient not taking: Reported on 09/28/2023), Disp: 30 g, Rfl: 0   insulin  aspart (NOVOLOG ) 100 UNIT/ML FlexPen, CBG 70 - 120: 0 units  CBG 121 - 150: 2 units  CBG 151 - 200: 3 units  CBG 201 - 250: 5 units  CBG 251 - 300: 8 units  CBG 301 - 350: 11 units  CBG 351 - 400: 15 units (Patient not taking: Reported on 09/28/2023), Disp: 15 mL, Rfl: 11   insulin  glargine (LANTUS ) 100 UNIT/ML Solostar Pen, Inject 25 Units into the skin at bedtime. (Patient not taking: Reported on 09/28/2023), Disp: 15 mL, Rfl: 11   Insulin  Pen Needle (PEN NEEDLES 29GX1/2) 29G X MISC, 100 Units by Does not apply route 3 (three) times daily before meals., Disp: 100 each, Rfl: 0   Insulin  Pen Needle (PEN NEEDLES) 32G X 4 MM MISC, 100 Units by Does not apply route 3 (three) times daily before meals., Disp: 100 each, Rfl: 3:  :   Allergies  Allergen Reactions   Cefixime Other (See Comments), Hives and Rash    Due to myasthenia gravis   Citrus Dermatitis  Causes burning of skin and blisters   Duloxetine Nausea Only and Other (See Comments)    Cymbalta    Other Hives, Nausea Only, Dermatitis, Rash and Other (See Comments)    Some nuts cause RASHES, too- some are tolerated   Rosuvastatin  Other  (See Comments)    Rhabdomyolysis   Tapentadol Other (See Comments)    Headaches (Nucynta)   Latex Other (See Comments)    BLISTERS   Lorazepam  Other (See Comments)    Hallucinations   Orange Concentrate [Flavoring Agent (Non-Screening)] Other (See Comments)    Acid reflux   Oxycodone  Hcl Hives   Percocet [Oxycodone -Acetaminophen ] Hives   Sulfa Antibiotics Hives and Nausea And Vomiting   Diazepam Other (See Comments)    Hallucinations    Statins Other (See Comments)    Statin induced myositis from rosuvastatin . DO NOT Attempt to try other statins.   Tape Other (See Comments)    BLISTERS and BURNS THE SKIN   Bioflavonoids Rash    Causes burning of skin and blisters  :   Family History  Problem Relation Age of Onset   Asthma Daughter    Heart disease Mother    Cancer Mother    Hyperlipidemia Mother    Hypertension Mother    Diabetes Father   :   Social History   Socioeconomic History   Marital status: Married    Spouse name: Not on file   Number of children: 1   Years of education: Not on file   Highest education level: Not on file  Occupational History   Occupation: disabled  Tobacco Use   Smoking status: Never   Smokeless tobacco: Never  Vaping Use   Vaping status: Never Used  Substance and Sexual Activity   Alcohol use: Yes    Comment: occassional wine   Drug use: No   Sexual activity: Not on file  Other Topics Concern   Not on file  Social History Narrative   Not on file   Social Drivers of Health   Financial Resource Strain: Low Risk  (08/16/2023)   Received from [facility]   Overall Financial Resource Strain (CARDIA)    Difficulty of Paying Living Expenses: Not hard at all  Food Insecurity: No Food Insecurity (11/01/2023)   Hunger Vital Sign    Worried About Running Out of Food in the Last Year: Never true    Ran Out of Food in the Last Year: Never true  Recent Concern: Food Insecurity - Food Insecurity Present (08/16/2023)   Received from  [facility]   Hunger Vital Sign    Within the past 12 months, you worried that your food would run out before you got the money to buy more.: Never true    Within the past 12 months, the food you bought just didn't last and you didn't have money to get more.: Sometimes true  Transportation Needs: No Transportation Needs (11/01/2023)   PRAPARE - Administrator, Civil Service (Medical): No    Lack of Transportation (Non-Medical): No  Physical Activity: Insufficiently Active (08/16/2023)   Received from [facility]   Exercise Vital Sign    On average, how many days per week do you engage in moderate to strenuous exercise (like a brisk walk)?: 1 day    On average, how many minutes do you engage in exercise at this level?: 10 min  Stress: Stress Concern Present (08/16/2023)   Received from [facility] -  Occupational Stress Questionnaire    Feeling of Stress : To some extent  Social Connections: Moderately Integrated (08/16/2023)   Received from [facility]   Social Network    How would you rate your social network (family, work, friends)?: Adequate participation with social networks  Intimate Partner Violence: Not At Risk (11/01/2023)   Humiliation, Afraid, Rape, and Kick questionnaire    Fear of Current or Ex-Partner: No    Emotionally Abused: No    Physically Abused: No    Sexually Abused: No  :  Review of Systems  Constitutional:  Positive for malaise/fatigue.  HENT: Negative.    Eyes:  Positive for blurred vision.  Respiratory:  Positive for shortness of breath.   Cardiovascular:  Positive for palpitations and leg swelling.  Gastrointestinal:  Positive for heartburn.  Genitourinary:  Positive for dysuria.  Musculoskeletal:  Positive for back pain, joint pain and myalgias.  Skin: Negative.   Neurological:  Positive for dizziness.  Endo/Heme/Allergies: Negative.   Psychiatric/Behavioral: Negative.       Exam:  Vital  signs show temperature of 97.8.  Pulse 95.  Blood pressure 141/80.  Weight is 165 pounds.  @IPVITALS @ Physical Exam Vitals reviewed.  HENT:     Head: Normocephalic and atraumatic.  Eyes:     Pupils: Pupils are equal, round, and reactive to light.  Cardiovascular:     Rate and Rhythm: Normal rate and regular rhythm.     Heart sounds: Normal heart sounds.  Pulmonary:     Effort: Pulmonary effort is normal.     Breath sounds: Normal breath sounds.  Abdominal:     General: Bowel sounds are normal.     Palpations: Abdomen is soft.  Musculoskeletal:        General: No tenderness or deformity. Normal range of motion.     Cervical back: Normal range of motion.  Lymphadenopathy:     Cervical: No cervical adenopathy.  Skin:    General: Skin is warm and dry.     Findings: No erythema or rash.  Neurological:     Mental Status: She is alert and oriented to person, place, and time.  Psychiatric:        Behavior: Behavior normal.        Thought Content: Thought content normal.        Judgment: Judgment normal.     Recent Labs    11/01/23 1330  WBC 14.0*  HGB 14.2  HCT 42.2  PLT 209    Recent Labs    11/01/23 1330  NA 134*  K 4.3  CL 101  CO2 17*  GLUCOSE 230*  BUN 21  CREATININE 1.05*  CALCIUM  9.5    Blood smear review: Normochromic normocytic population of red blood cells.  There are no nucleated red blood cells.  I see no rouleaux formation.  There are no teardrop cells.  I see no schistocytes or spherocytes.  White blood cells are increased in number.  She has mostly polys.  There may be a couple hyper segmented polys.  There may be a couple bands.  I do not see any immature myeloid or lymphoid forms.  Platelets are adequate in number and size.  Platelets are well granulated.  Pathology: None    Assessment and Plan: Ms. Hogge is a very charming 63 year old white female.  She has numerous health issues.  She is on longstanding steroids.  I would have to believe that  the steroids are probably the source of the mild leukocytosis.  Her blood smear is relatively unrevealing.  Her blood smear certainly looks like a reactive blood smear in somebody who is on steroids.  She has to be safe, I will get a ultrasound of her abdomen to make sure there is no splenomegaly.  However, on a CT scan that she had done just last year, there is no evidence of splenomegaly or lymphadenopathy.  I really do not see that we have to do a bone marrow biopsy on her.  We will also check her for hemochromatosis.  It would not surprise me if she does have that hemochromatosis gene.  Again this is a very strong gene in her family.  I have several family members of hers that we are seen who do have hemochromatosis.  I would like to see about getting to the ultrasound in a week or so.  I would like to have her come back to see us  after Labor Day weekend and we can see how everything looks.  She comes in with her husband.  They are both very very nice.  I certainly had a very nice time with him.

## 2023-11-05 ENCOUNTER — Other Ambulatory Visit: Payer: Self-pay | Admitting: Hematology & Oncology

## 2023-11-05 ENCOUNTER — Ambulatory Visit: Payer: Self-pay | Admitting: Hematology & Oncology

## 2023-11-05 ENCOUNTER — Encounter: Payer: Self-pay | Admitting: Hematology & Oncology

## 2023-11-05 LAB — HEMOCHROMATOSIS DNA-PCR(C282Y,H63D)

## 2023-11-07 ENCOUNTER — Ambulatory Visit (HOSPITAL_COMMUNITY)
Admission: RE | Admit: 2023-11-07 | Discharge: 2023-11-07 | Disposition: A | Discharge status: Home / Self Care | Source: Ambulatory Visit | Attending: Hematology & Oncology | Admitting: Hematology & Oncology

## 2023-11-07 DIAGNOSIS — D72823 Leukemoid reaction: Secondary | ICD-10-CM | POA: Insufficient documentation

## 2023-11-10 ENCOUNTER — Other Ambulatory Visit: Payer: Self-pay

## 2023-11-10 ENCOUNTER — Encounter (HOSPITAL_COMMUNITY): Payer: Self-pay | Admitting: Internal Medicine

## 2023-11-10 ENCOUNTER — Observation Stay (HOSPITAL_COMMUNITY)
Admission: EM | Admit: 2023-11-10 | Discharge: 2023-11-12 | Disposition: A | Discharge status: Home / Self Care | Attending: Internal Medicine | Admitting: Internal Medicine

## 2023-11-10 ENCOUNTER — Emergency Department (HOSPITAL_COMMUNITY)

## 2023-11-10 DIAGNOSIS — A0811 Acute gastroenteropathy due to Norwalk agent: Principal | ICD-10-CM | POA: Insufficient documentation

## 2023-11-10 DIAGNOSIS — R110 Nausea: Secondary | ICD-10-CM

## 2023-11-10 DIAGNOSIS — E785 Hyperlipidemia, unspecified: Secondary | ICD-10-CM | POA: Diagnosis not present

## 2023-11-10 DIAGNOSIS — G894 Chronic pain syndrome: Secondary | ICD-10-CM | POA: Diagnosis not present

## 2023-11-10 DIAGNOSIS — I10 Essential (primary) hypertension: Secondary | ICD-10-CM | POA: Insufficient documentation

## 2023-11-10 DIAGNOSIS — Z6832 Body mass index (BMI) 32.0-32.9, adult: Secondary | ICD-10-CM | POA: Diagnosis not present

## 2023-11-10 DIAGNOSIS — F1120 Opioid dependence, uncomplicated: Secondary | ICD-10-CM | POA: Diagnosis not present

## 2023-11-10 DIAGNOSIS — Z7984 Long term (current) use of oral hypoglycemic drugs: Secondary | ICD-10-CM | POA: Diagnosis not present

## 2023-11-10 DIAGNOSIS — Z79899 Other long term (current) drug therapy: Secondary | ICD-10-CM | POA: Insufficient documentation

## 2023-11-10 DIAGNOSIS — K5090 Crohn's disease, unspecified, without complications: Secondary | ICD-10-CM | POA: Insufficient documentation

## 2023-11-10 DIAGNOSIS — Z9049 Acquired absence of other specified parts of digestive tract: Secondary | ICD-10-CM | POA: Insufficient documentation

## 2023-11-10 DIAGNOSIS — K5080 Crohn's disease of both small and large intestine without complications: Secondary | ICD-10-CM | POA: Diagnosis not present

## 2023-11-10 DIAGNOSIS — E669 Obesity, unspecified: Secondary | ICD-10-CM | POA: Diagnosis not present

## 2023-11-10 DIAGNOSIS — R1110 Vomiting, unspecified: Secondary | ICD-10-CM | POA: Diagnosis not present

## 2023-11-10 DIAGNOSIS — R112 Nausea with vomiting, unspecified: Secondary | ICD-10-CM | POA: Diagnosis present

## 2023-11-10 DIAGNOSIS — Z85828 Personal history of other malignant neoplasm of skin: Secondary | ICD-10-CM | POA: Diagnosis not present

## 2023-11-10 DIAGNOSIS — R197 Diarrhea, unspecified: Secondary | ICD-10-CM | POA: Diagnosis present

## 2023-11-10 DIAGNOSIS — M797 Fibromyalgia: Secondary | ICD-10-CM | POA: Insufficient documentation

## 2023-11-10 DIAGNOSIS — Z794 Long term (current) use of insulin: Secondary | ICD-10-CM | POA: Insufficient documentation

## 2023-11-10 DIAGNOSIS — G7000 Myasthenia gravis without (acute) exacerbation: Secondary | ICD-10-CM | POA: Insufficient documentation

## 2023-11-10 DIAGNOSIS — E871 Hypo-osmolality and hyponatremia: Secondary | ICD-10-CM | POA: Diagnosis not present

## 2023-11-10 DIAGNOSIS — J45909 Unspecified asthma, uncomplicated: Secondary | ICD-10-CM | POA: Insufficient documentation

## 2023-11-10 DIAGNOSIS — E119 Type 2 diabetes mellitus without complications: Secondary | ICD-10-CM | POA: Diagnosis not present

## 2023-11-10 LAB — RESPIRATORY PANEL BY PCR

## 2023-11-10 LAB — RESP PANEL BY RT-PCR (RSV, FLU A&B, COVID)  RVPGX2
Influenza A by PCR: NEGATIVE
Influenza B by PCR: NEGATIVE
Resp Syncytial Virus by PCR: NEGATIVE
SARS Coronavirus 2 by RT PCR: NEGATIVE

## 2023-11-10 LAB — COMPREHENSIVE METABOLIC PANEL WITH GFR
ALT: 18 U/L (ref 0–44)
AST: 21 U/L (ref 15–41)
Albumin: 3.7 g/dL (ref 3.5–5.0)
Alkaline Phosphatase: 39 U/L (ref 38–126)
Anion gap: 16 — ABNORMAL HIGH (ref 5–15)
BUN: 23 mg/dL (ref 8–23)
CO2: 17 mmol/L — ABNORMAL LOW (ref 22–32)
Calcium: 9.5 mg/dL (ref 8.9–10.3)
Chloride: 104 mmol/L (ref 98–111)
Creatinine, Ser: 0.9 mg/dL (ref 0.44–1.00)
GFR, Estimated: >60 mL/min (ref >60)
Glucose, Bld: 140 mg/dL — ABNORMAL HIGH (ref 70–99)
Potassium: 3.9 mmol/L (ref 3.5–5.1)
Sodium: 137 mmol/L (ref 135–145)
Total Bilirubin: 1 mg/dL (ref 0.0–1.2)
Total Protein: 7.4 g/dL (ref 6.5–8.1)

## 2023-11-10 LAB — URINALYSIS, ROUTINE W REFLEX MICROSCOPIC
Bilirubin Urine: NEGATIVE
Glucose, UA: NEGATIVE mg/dL
Hgb urine dipstick: NEGATIVE
Ketones, ur: NEGATIVE mg/dL
Leukocytes,Ua: NEGATIVE
Nitrite: NEGATIVE
Protein, ur: 30 mg/dL — AB
Specific Gravity, Urine: 1.021 (ref 1.005–1.030)
pH: 5 (ref 5.0–8.0)

## 2023-11-10 LAB — CBC
HCT: 46.9 % — ABNORMAL HIGH (ref 36.0–46.0)
Hemoglobin: 15.8 g/dL — ABNORMAL HIGH (ref 12.0–15.0)
MCH: 33 pg (ref 26.0–34.0)
MCHC: 33.7 g/dL (ref 30.0–36.0)
MCV: 97.9 fL (ref 80.0–100.0)
Platelets: 219 K/uL (ref 150–400)
RBC: 4.79 MIL/uL (ref 3.87–5.11)
RDW: 14.5 % (ref 11.5–15.5)
WBC: 15.4 K/uL — ABNORMAL HIGH (ref 4.0–10.5)
nRBC: 0 % (ref 0.0–0.2)

## 2023-11-10 LAB — LIPASE, BLOOD: Lipase: 25 U/L (ref 11–51)

## 2023-11-10 LAB — MAGNESIUM: Magnesium: 1.3 mg/dL — ABNORMAL LOW (ref 1.7–2.4)

## 2023-11-10 MED ORDER — PROGESTERONE MICRONIZED 100 MG PO CAPS: 100.0000 mg | ORAL_CAPSULE | Freq: Every day | ORAL | Status: DC

## 2023-11-10 MED ORDER — PANTOPRAZOLE SODIUM 40 MG IV SOLR
40.0000 mg | Freq: Two times a day (BID) | INTRAVENOUS | Status: DC
  Administered 2023-11-10 – 2023-11-12 (×5): 40 mg via INTRAVENOUS
  Filled 2023-11-10 (×5): qty 10

## 2023-11-10 MED ORDER — MORPHINE SULFATE (PF) 4 MG/ML IV SOLN
1.0000 mg | Freq: Once | INTRAVENOUS | Status: AC
  Administered 2023-11-10: 1 mg via INTRAVENOUS
  Filled 2023-11-10: qty 1

## 2023-11-10 MED ORDER — SODIUM CHLORIDE 0.9 % IV BOLUS
500.0000 mL | Freq: Once | INTRAVENOUS | Status: AC
  Administered 2023-11-10: 500 mL via INTRAVENOUS

## 2023-11-10 MED ORDER — HEPARIN SODIUM (PORCINE) 5000 UNIT/ML IJ SOLN
5000.0000 [IU] | Freq: Three times a day (TID) | INTRAMUSCULAR | Status: DC
  Administered 2023-11-10 – 2023-11-12 (×5): 5000 [IU] via SUBCUTANEOUS
  Filled 2023-11-10 (×5): qty 1

## 2023-11-10 MED ORDER — PROCHLORPERAZINE EDISYLATE 10 MG/2ML IJ SOLN
5.0000 mg | Freq: Four times a day (QID) | INTRAMUSCULAR | Status: DC
  Administered 2023-11-10 – 2023-11-12 (×7): 5 mg via INTRAVENOUS
  Filled 2023-11-10 (×8): qty 2

## 2023-11-10 MED ORDER — PROGESTERONE MICRONIZED 100 MG PO CAPS
100.0000 mg | ORAL_CAPSULE | Freq: Every day | ORAL | Status: DC
  Administered 2023-11-10 – 2023-11-11 (×2): 100 mg via ORAL
  Filled 2023-11-10 (×3): qty 1

## 2023-11-10 MED ORDER — MYCOPHENOLATE MOFETIL 250 MG PO CAPS
1000.0000 mg | ORAL_CAPSULE | Freq: Two times a day (BID) | ORAL | Status: DC
  Administered 2023-11-10 – 2023-11-12 (×4): 1000 mg via ORAL
  Filled 2023-11-10 (×4): qty 4

## 2023-11-10 MED ORDER — HYDROCODONE-ACETAMINOPHEN 10-325 MG PO TABS
1.0000 | ORAL_TABLET | Freq: Four times a day (QID) | ORAL | Status: DC | PRN
  Administered 2023-11-10 – 2023-11-12 (×6): 1 via ORAL
  Filled 2023-11-10 (×6): qty 1

## 2023-11-10 MED ORDER — ONDANSETRON HCL 4 MG/2ML IJ SOLN
4.0000 mg | Freq: Once | INTRAMUSCULAR | Status: AC | PRN
  Administered 2023-11-10: 4 mg via INTRAVENOUS
  Filled 2023-11-10: qty 2

## 2023-11-10 MED ORDER — MYCOPHENOLATE MOFETIL 500 MG PO TABS: 1000.0000 mg | ORAL_TABLET | Freq: Two times a day (BID) | ORAL | Status: DC

## 2023-11-10 MED ORDER — PREGABALIN 50 MG PO CAPS
100.0000 mg | ORAL_CAPSULE | Freq: Every day | ORAL | Status: DC
Start: 2023-11-10 — End: 2023-11-12
  Administered 2023-11-10 – 2023-11-11 (×2): 100 mg via ORAL
  Filled 2023-11-10 (×2): qty 2

## 2023-11-10 MED ORDER — MAGNESIUM OXIDE -MG SUPPLEMENT 400 (240 MG) MG PO TABS
400.0000 mg | ORAL_TABLET | Freq: Every day | ORAL | Status: DC
  Administered 2023-11-11 – 2023-11-12 (×2): 400 mg via ORAL
  Filled 2023-11-10 (×2): qty 1

## 2023-11-10 MED ORDER — FESOTERODINE FUMARATE ER 4 MG PO TB24
4.0000 mg | ORAL_TABLET | Freq: Every day | ORAL | Status: DC
  Administered 2023-11-10 – 2023-11-11 (×2): 4 mg via ORAL
  Filled 2023-11-10 (×3): qty 1

## 2023-11-10 MED ORDER — LACTATED RINGERS IV SOLN: INTRAVENOUS | Status: AC

## 2023-11-10 MED ORDER — ESTRADIOL 0.5 MG PO TABS
2.0000 mg | ORAL_TABLET | Freq: Every day | ORAL | Status: DC
  Administered 2023-11-10 – 2023-11-11 (×2): 2 mg via ORAL
  Filled 2023-11-10: qty 1
  Filled 2023-11-10 (×2): qty 4

## 2023-11-10 MED ORDER — IOHEXOL 300 MG/ML  SOLN
100.0000 mL | Freq: Once | INTRAMUSCULAR | Status: AC | PRN
  Administered 2023-11-10: 100 mL via INTRAVENOUS

## 2023-11-10 MED ORDER — SODIUM CHLORIDE 0.9 % IV BOLUS
1000.0000 mL | Freq: Once | INTRAVENOUS | Status: AC
  Administered 2023-11-10: 1000 mL via INTRAVENOUS

## 2023-11-10 MED ORDER — DESMOPRESSIN ACETATE 0.1 MG PO TABS
0.1000 mg | ORAL_TABLET | Freq: Every day | ORAL | Status: DC
  Administered 2023-11-10: 0.1 mg via ORAL
  Filled 2023-11-10 (×2): qty 1

## 2023-11-10 MED ORDER — MESALAMINE 400 MG PO CPDR
800.0000 mg | DELAYED_RELEASE_CAPSULE | Freq: Two times a day (BID) | ORAL | Status: DC
  Administered 2023-11-10 – 2023-11-12 (×4): 800 mg via ORAL
  Filled 2023-11-10 (×6): qty 2

## 2023-11-10 MED ORDER — EZETIMIBE 10 MG PO TABS
10.0000 mg | ORAL_TABLET | Freq: Every day | ORAL | Status: DC
  Administered 2023-11-10 – 2023-11-12 (×3): 10 mg via ORAL
  Filled 2023-11-10 (×3): qty 1

## 2023-11-10 MED ORDER — BOOST / RESOURCE BREEZE PO LIQD CUSTOM
1.0000 | Freq: Three times a day (TID) | ORAL | Status: DC
  Administered 2023-11-10: 237 mL via ORAL

## 2023-11-10 MED ORDER — METOCLOPRAMIDE HCL 5 MG/ML IJ SOLN
5.0000 mg | Freq: Once | INTRAMUSCULAR | Status: AC
  Administered 2023-11-10: 5 mg via INTRAVENOUS
  Filled 2023-11-10: qty 2

## 2023-11-10 MED ORDER — PREDNISONE 20 MG PO TABS
20.0000 mg | ORAL_TABLET | Freq: Every day | ORAL | Status: DC
  Administered 2023-11-11 – 2023-11-12 (×2): 20 mg via ORAL
  Filled 2023-11-10 (×2): qty 1

## 2023-11-10 MED ORDER — MAGNESIUM OXIDE -MG SUPPLEMENT 400 (240 MG) MG PO TABS
400.0000 mg | ORAL_TABLET | Freq: Once | ORAL | Status: AC
  Administered 2023-11-10: 400 mg via ORAL
  Filled 2023-11-10: qty 1

## 2023-11-10 NOTE — H&P (Signed)
 History and Physical    Patient: Teresa Lee FMW:[id number] DOB: [birth year] DOA: 11/10/2023 DOS: the patient was seen and examined on 11/10/2023 PCP: System, Provider Not In  Patient coming from: Home  Chief Complaint:  Chief Complaint  Patient presents with   Emesis   Nausea   HPI: [name] is a 63 year old female with a history of diabetes mellitus type 2, hypertension, hyperlipidemia, myasthenia gravis, Crohn's ileocolitis, chronic pain with fibromyalgia on opioids, chronic leukemoid reaction, spinal stimulator placed 2019, B12 deficiency presenting with nausea, vomiting, diarrhea and upper abdominal pain that began on 11/09/2023. Notably, the patient was recently admitted to the hospital from 09/28/2023 to 09/30/2023 secondary to sepsis from E. coli UTI.  She was discharged home with cephalexin .  The patient stated that she continued to have dysuria.  She went back to see her PCP, and the patient was placed on 10 additional days of antibiotics which she finished in the beginning of August.  She denies any fevers, chills, chest pain, shortness of breath.  She has nonproductive cough.  She denies any hematemesis.  She has upper abdominal pain.  She has dysuria.  There is no hematuria, hematochezia, melena. She denies any recent travels.  She denies any sick contacts.  She denies eating any raw or undercooked foods. In the ED, the patient was afebrile and hemodynamically stable with oxygen saturation 96% on room air.  WBC 15.4, hemoglobin 15.8, platelets 219.  Sodium 137, potassium 3.9, bicarbonate 17, serum creatinine 0.90.  Magnesium  1.3.  LFTs were unremarkable.  Lipase 25.  CT abdomen and pelvis negative for any acute findings.  The patient was given 1.5 L normal saline.  He was given Reglan  and magnesium  and Zofran .  Morphine  4 mg was given.  She is admitted for intractable vomiting, abdominal pain and diarrhea.  Review of Systems: As mentioned in the history of present  illness. All other systems reviewed and are negative. 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   Herniated nucleus pulposus    Hyperlipidemia    Hypertension    IBS (irritable bowel syndrome)    Leukocytosis    Myasthenia gravis    Myasthenia gravis (HCC)    Myasthenia gravis (HCC)    Non-insulin  dependent type 2 diabetes mellitus (HCC) 07/13/2022   Pneumonia    Sleep apnea    Spinal cord stimulator status    Past Surgical History:  Procedure Laterality Date   BACK SURGERY     BIOPSY  05/24/2020   Procedure: BIOPSY;  Surgeon: Donnald Charleston, MD;  Location: [facility] ENDOSCOPY;  Service: Endoscopy;;   BREAST REDUCTION SURGERY     CARPAL TUNNEL RELEASE     bilateral   cervical disc inj     CHOLECYSTECTOMY     COLONOSCOPY WITH PROPOFOL  N/A 05/24/2020   Procedure: COLONOSCOPY WITH PROPOFOL ;  Surgeon: Donnald Charleston, MD;  Location: [facility] ENDOSCOPY;  Service: Endoscopy;  Laterality: N/A;   ENDOMETRIAL ABLATION W/ NOVASURE     ESOPHAGOGASTRODUODENOSCOPY (EGD) WITH PROPOFOL  N/A 05/24/2020  Procedure: ESOPHAGOGASTRODUODENOSCOPY (EGD) WITH PROPOFOL ;  Surgeon: Donnald Charleston, MD;  Location: [facility] ENDOSCOPY;  Service: Endoscopy;  Laterality: N/A;   LUMBAR LAMINECTOMY/DECOMPRESSION MICRODISCECTOMY Right 05/04/2020   Procedure: Right Lumbar Five Sacral One Microdiscectomy;  Surgeon: Unice Pac, MD;  Location: [facility] OR;  Service: Neurosurgery;  Laterality: Right;  posterior   MELANOMA EXCISION     MUSCLE BIOPSY Left 07/17/2022   Procedure: LEFT THIGH MUSCLE BIOPSY;  Surgeon: Rubin Calamity, MD;  Location: [facility] OR;  Service: General;   Laterality: Left;   NASAL SINUS SURGERY     SPINAL CORD STIMULATOR INSERTION  2019   SPINAL CORD STIMULATOR INSERTION N/A 10/18/2021   Procedure: REMOVAL OF  SPINAL CORD STIMULATOR;  Surgeon: Dawley, Lani BROCKS, DO;  Location: [facility] OR;  Service: Neurosurgery;  Laterality: N/A;  3C   THYMECTOMY     due to myastenia gravis   THYMECTOMY     TRANSFORAMINAL LUMBAR INTERBODY FUSION (TLIF) WITH PEDICLE SCREW FIXATION 1 LEVEL Right 05/04/2020   Procedure: Right Lumbar Four-Five Transforaminal lumbar interbody fusion;  Surgeon: Unice Pac, MD;  Location: [facility] OR;  Service: Neurosurgery;  Laterality: Right;  posterior   Social History:  reports that she has never smoked. She has never used smokeless tobacco. She reports current alcohol use. She reports that she does not use drugs.  Allergies  Allergen Reactions   Cefixime Other (See Comments), Hives and Rash    Due to myasthenia gravis   Citrus Dermatitis    Causes burning of skin and blisters   Duloxetine Nausea Only and Other (See Comments)    Cymbalta    Other Hives, Nausea Only, Dermatitis, Rash and Other (See Comments)    Some nuts cause RASHES, too- some are tolerated   Rosuvastatin  Other (See Comments)    Rhabdomyolysis   Tapentadol Other (See Comments)    Headaches (Nucynta)   Latex Other (See Comments)    BLISTERS   Lorazepam  Other (See Comments)    Hallucinations   Orange Concentrate [Flavoring Agent (Non-Screening)] Other (See Comments)    Acid reflux   Oxycodone  Hcl Hives   Percocet [Oxycodone -Acetaminophen ] Hives   Sulfa Antibiotics Hives and Nausea And Vomiting   Diazepam Other (See Comments)    Hallucinations    Statins Other (See Comments)    Statin induced myositis from rosuvastatin . DO NOT Attempt to try other statins.   Tape Other (See Comments)    BLISTERS and BURNS THE SKIN   Bioflavonoids Rash    Causes burning of skin and blisters    Family History  Problem Relation Age of Onset   Asthma Daughter    Heart  disease Mother    Cancer Mother    Hyperlipidemia Mother    Hypertension Mother    Diabetes Father     Prior to Admission medications   Medication Sig Start Date End Date Taking? Authorizing Provider  albuterol  (PROVENTIL ) (2.5 MG/3ML) 0.083% nebulizer solution Take 2.5 mg by nebulization every 6 (six) hours as needed for shortness of breath or wheezing. 12/26/19   [provider]  albuterol  (VENTOLIN  HFA) 108 (90 Base) MCG/ACT inhaler 1 PUFF EVERY 6 HOURS AS NEEDED FOR WHEEZING 09/05/19   [place], Betty G, MD  Blood Glucose Monitoring Suppl DEVI 1 each by Does not apply route in the morning, at noon, and at bedtime. May substitute to any manufacturer covered by patient's insurance. 07/19/22   Akula, Vijaya, MD  cyanocobalamin  (,VITAMIN B-12,) 1000 MCG/ML injection Inject 1,000 mcg into the muscle See admin instructions.  Inject 1,000 mcg into the muscle every 6-8 weeks    [provider]  desmopressin  (DDAVP ) 0.2 MG tablet Take 0.2 mg by mouth at bedtime. Patient taking differently: Take 0.1 mg by mouth at bedtime. 08/22/21   [provider]  estradiol  (ESTRACE ) 2 MG tablet Take 2 mg by mouth at bedtime.    [provider]  ezetimibe  (ZETIA ) 10 MG tablet Take 10 mg by mouth daily.    [provider]  glimepiride (AMARYL) 1 MG tablet Take 1 mg by mouth daily with breakfast.    [provider]  HYDROcodone -acetaminophen  (NORCO) 10-325 MG tablet Take 1 tablet by mouth every 4 (four) hours. 08/25/17   [provider]  hydrocortisone  (ANUSOL -HC) 2.5 % rectal cream Place 1 Application rectally 2 (two) times daily. Patient not taking: Reported on 09/28/2023 07/30/22   Ruthe Cornet, DO  insulin  aspart (NOVOLOG ) 100 UNIT/ML FlexPen CBG 70 - 120: 0 units  CBG 121 - 150: 2 units  CBG 151 - 200: 3 units  CBG 201 - 250: 5 units  CBG 251 - 300: 8 units  CBG 301 - 350: 11 units  CBG 351 - 400: 15 units Patient not taking: Reported on 09/28/2023 07/19/22    Akula, Vijaya, MD  insulin  glargine (LANTUS ) 100 UNIT/ML Solostar Pen Inject 25 Units into the skin at bedtime. Patient not taking: Reported on 09/28/2023 07/19/22   Cherlyn Labella, MD  Insulin  Pen Needle (PEN NEEDLES 29GX1/2) 29G X MISC 100 Units by Does not apply route 3 (three) times daily before meals. 07/19/22   Akula, Vijaya, MD  Insulin  Pen Needle (PEN NEEDLES) 32G X 4 MM MISC 100 Units by Does not apply route 3 (three) times daily before meals. 07/19/22   Akula, Vijaya, MD  losartan  (COZAAR ) 100 MG tablet Take 100 mg by mouth daily.    [provider]  Mesalamine  (ASACOL ) 400 MG CPDR DR capsule Take 800 mg by mouth in the morning and at bedtime. Patient taking differently: Take 400 mg by mouth in the morning and at bedtime. 10/10/21   [provider]  methocarbamol  (ROBAXIN ) 500 MG tablet Take 500-1,000 mg by mouth See admin instructions. Take 500-1,000 mg by mouth at bedtime and an additional 500 mg once a day as needed for muscle spasms    [provider]  mycophenolate  (CELLCEPT ) 500 MG tablet Take 1,000 mg by mouth in the morning and at bedtime.    [provider]  naloxone  (NARCAN ) nasal spray 4 mg/0.1 mL Place 1 spray into the nose once as needed (opioid overdose).    [provider]  omega-3 acid ethyl esters (LOVAZA ) 1 g capsule Take 1 g by mouth 4 (four) times daily. Patient taking differently: Take 1 g by mouth 2 (two) times daily.    [provider]  ondansetron  (ZOFRAN ) 4 MG tablet Take 4 mg by mouth every 8 (eight) hours as needed for nausea or vomiting.    [provider]  pantoprazole  (PROTONIX ) 40 MG tablet TAKE 1 TABLET DAILY 10/15/19   [place], Betty G, MD  pioglitazone (ACTOS) 15 MG tablet Take 15 mg by mouth daily.    [provider]  potassium chloride  SA (KLOR-CON  M) 20 MEQ tablet Take 20 mEq by mouth in the morning and at bedtime. Patient taking differently: Take 20 mEq by mouth daily. 06/15/21    [provider]  predniSONE  (DELTASONE ) 10 MG tablet Prednisone  50 mg daily for 3 days followed by  Prednisone  40 mg daily for 5 days followed by  Prednisone  20 mg daily for 5 days followed by  Prednisone  10 mg daily Patient taking differently: Take 10 mg by mouth daily with breakfast. 07/19/22   Cherlyn Labella, MD  pregabalin  (LYRICA ) 100 MG capsule Take 100 mg by mouth at bedtime.    [provider]  progesterone  (PROMETRIUM ) 100 MG capsule Take 100 mg by mouth at bedtime.    [provider]  pyridostigmine  (MESTINON ) 60 MG tablet Take 30 mg by mouth 3 (three) times daily as needed (for Myasthenia gravis symptoms).    [provider]  rOPINIRole  (REQUIP ) 1 MG tablet Take 1-2 tablets (1-2 mg total) by mouth at bedtime. 08/08/19   [place], Betty G, MD  solifenacin (VESICARE) 10 MG tablet Take 10 mg by mouth at bedtime.    [provider]  Spacer/Aero-Holding Chambers (AEROCHAMBER PLUS) inhaler Use as instructed to use with inahaler. 08/30/18   [place], Betty G, MD  spironolactone  (ALDACTONE ) 25 MG tablet Take 25 mg by mouth daily.    [provider]  SYMBICORT  80-4.5 MCG/ACT inhaler Inhale 2 puffs into the lungs 2 (two) times daily. Patient taking differently: Inhale 2 puffs into the lungs in the morning and at bedtime. 09/05/19   [place], Betty G, MD  Vitamin D , Ergocalciferol , (DRISDOL ) 1.25 MG (50000 UNIT) CAPS capsule Take 50,000 Units by mouth every 14 (fourteen) days.    [provider]    Physical Exam: Vitals:   11/10/23 1030 11/10/23 1130 11/10/23 1200 11/10/23 1215  BP: (!) 143/79 101/80 125/74 132/71  Pulse: 84  85 81  Resp: 19 10 16 17   Temp:      TempSrc:      SpO2: 95%  97% 96%  Weight:      Height:       GENERAL:  A&O x 3, NAD, well developed, cooperative, follows commands HEENT: [place]/AT, No thrush, No icterus, No oral ulcers Neck:  No neck mass, No meningismus, soft, supple CV: RRR, no S3, no S4, no rub, no  JVD Lungs:  CTA, no wheeze, no rhonchi, good air movement Abd: soft/upper abd tenderness +BS, nondistended Ext: No edema, no lymphangitis, no cyanosis, no rashes Neuro:  CN II-XII intact, strength 4/5 in RUE, RLE, strength 4/5 LUE, LLE; sensation intact bilateral; no dysmetria; babinski equivocal  Data Reviewed: Data reviewed above in history Assessment and Plan: Intractable vomiting and diarrhea -Start Zofran  around-the-clock - Check stool for C. Difficile - Stool pathogen panel - Clear liquid diet - Start pantoprazole  twice daily - 11/10/2023 CT AP-- no acute findings - Continue IV fluids - COVID/RSV/Flu neg - viral respiratory panel  Crohn's ileocolitis - Continue mesalamine   Myasthenia gravis - Continue mycophenolate  - Continue prednisone --give double dose for stress reaction  Opioid dependence/chronic pain syndrome - PDMP reviewed - norco 10/325, #120, last refill 10/19/23 - lyrica  100 mg, #30, last refill 10/31/23  Hypomagnesemia - Replete judiciously in MG  Diabetes mellitus type 2, controlled - Holding Actos and Amaryl - 10/11/2023 hemoglobin A1c 5.9 - Plan short-term sliding scale as the patient will be on higher prednisone  dose  Essential hypertension  -holding spironolactone  and losartan  to allow blood pressure margin - Currently BP controlled  Obesity - BMI 32.22 - Lifestyle modification      Advance Care Planning: FULL CODE  Consults: none  Family Communication: spouse 8/23  Severity of Illness: The appropriate patient status for this patient is OBSERVATION. Observation status is judged to be reasonable  and necessary in order to provide the required intensity of service to ensure the patient's safety. The patient's presenting symptoms, physical exam findings, and initial radiographic and laboratory data in the context of their medical condition is felt to place them at decreased risk for further clinical deterioration. Furthermore, it is anticipated  that the patient will be medically stable for discharge from the hospital within 2 midnights of admission.   Author: Alm Schneider, MD 11/10/2023 1:28 PM  For on call review [email or web address].

## 2023-11-10 NOTE — ED Provider Notes (Addendum)
 [place] EMERGENCY DEPARTMENT AT [facility] Provider Note   CSN: [id number] Arrival date & time: 11/10/23  9251     Patient presents with: Emesis and Nausea   Teresa Lee is a 63 y.o. female.    Emesis    She presents because of nausea vomit diarrhea.  Started last night around 11 PM.  No sick contacts.  Nobody else in her household has been sick.  Patient states that she been vomiting up green bile.  Clear diarrhea.  No blood that she can appreciate.  No dark stools.  No bright red blood per rectum.  No hematemesis.  She does endorse a history of cholecystectomy.  She is never had anything like this happen before.  No recent antibiotic use.  Denies all chest pain or shortness of breath.  Endorses chills but no fevers. No history of C.diff   Previous medical history reviewed : Patient last seen in the hospital and discharged on July 13 because of a UTI.  Pansensitive E. coli.  Prior to Admission medications   Medication Sig Start Date End Date Taking? Authorizing Provider  albuterol  (PROVENTIL ) (2.5 MG/3ML) 0.083% nebulizer solution Take 2.5 mg by nebulization every 6 (six) hours as needed for shortness of breath or wheezing. 12/26/19   [provider]  albuterol  (VENTOLIN  HFA) 108 (90 Base) MCG/ACT inhaler 1 PUFF EVERY 6 HOURS AS NEEDED FOR WHEEZING 09/05/19   [place], Betty G, MD  Blood Glucose Monitoring Suppl DEVI 1 each by Does not apply route in the morning, at noon, and at bedtime. May substitute to any manufacturer covered by patient's insurance. 07/19/22   Akula, Vijaya, MD  cyanocobalamin  (,VITAMIN B-12,) 1000 MCG/ML injection Inject 1,000 mcg into the muscle See admin instructions. Inject 1,000 mcg into the muscle every 6-8 weeks    [provider]  desmopressin  (DDAVP ) 0.2 MG tablet Take 0.2 mg by mouth at bedtime. Patient taking differently: Take 0.1 mg by mouth at bedtime. 08/22/21   [provider]  estradiol  (ESTRACE ) 2 MG tablet  Take 2 mg by mouth at bedtime.    [provider]  ezetimibe  (ZETIA ) 10 MG tablet Take 10 mg by mouth daily.    [provider]  glimepiride (AMARYL) 1 MG tablet Take 1 mg by mouth daily with breakfast.    [provider]  HYDROcodone -acetaminophen  (NORCO) 10-325 MG tablet Take 1 tablet by mouth every 4 (four) hours. 08/25/17   [provider]  hydrocortisone  (ANUSOL -HC) 2.5 % rectal cream Place 1 Application rectally 2 (two) times daily. Patient not taking: Reported on 09/28/2023 07/30/22   Ruthe Cornet, DO  insulin  aspart (NOVOLOG ) 100 UNIT/ML FlexPen CBG 70 - 120: 0 units  CBG 121 - 150: 2 units  CBG 151 - 200: 3 units  CBG 201 - 250: 5 units  CBG 251 - 300: 8 units  CBG 301 - 350: 11 units  CBG 351 - 400: 15 units Patient not taking: Reported on 09/28/2023 07/19/22   Akula, Vijaya, MD  insulin  glargine (LANTUS ) 100 UNIT/ML Solostar Pen Inject 25 Units into the skin at bedtime. Patient not taking: Reported on 09/28/2023 07/19/22   Cherlyn Labella, MD  Insulin  Pen Needle (PEN NEEDLES 29GX1/2) 29G X MISC 100 Units by Does not apply route 3 (three) times daily before meals. 07/19/22   Akula, Vijaya, MD  Insulin  Pen Needle (PEN NEEDLES) 32G X 4 MM MISC 100 Units by Does not apply route 3 (three) times daily before meals.  07/19/22   Akula, Vijaya, MD  losartan  (COZAAR ) 100 MG tablet Take 100 mg by mouth daily.    [provider]  Mesalamine  (ASACOL ) 400 MG CPDR DR capsule Take 800 mg by mouth in the morning and at bedtime. Patient taking differently: Take 400 mg by mouth in the morning and at bedtime. 10/10/21   [provider]  methocarbamol  (ROBAXIN ) 500 MG tablet Take 500-1,000 mg by mouth See admin instructions. Take 500-1,000 mg by mouth at bedtime and an additional 500 mg once a day as needed for muscle spasms    [provider]  mycophenolate  (CELLCEPT ) 500 MG tablet Take 1,000 mg by mouth in the morning and at bedtime.    [provider]  naloxone  (NARCAN ) nasal spray 4 mg/0.1 mL Place 1 spray into the nose once as needed (opioid overdose).    [provider]  omega-3 acid ethyl esters (LOVAZA ) 1 g capsule Take 1 g by mouth 4 (four) times daily. Patient taking differently: Take 1 g by mouth 2 (two) times daily.    [provider]  ondansetron  (ZOFRAN ) 4 MG tablet Take 4 mg by mouth every 8 (eight) hours as needed for nausea or vomiting.    [provider]  pantoprazole  (PROTONIX ) 40 MG tablet TAKE 1 TABLET DAILY 10/15/19   [place], Betty G, MD  pioglitazone (ACTOS) 15 MG tablet Take 15 mg by mouth daily.    [provider]  potassium chloride  SA (KLOR-CON  M) 20 MEQ tablet Take 20 mEq by mouth in the morning and at bedtime. Patient taking differently: Take 20 mEq by mouth daily. 06/15/21   [provider]  predniSONE  (DELTASONE ) 10 MG tablet Prednisone  50 mg daily for 3 days followed by  Prednisone  40 mg daily for 5 days followed by  Prednisone  20 mg daily for 5 days followed by  Prednisone  10 mg daily Patient taking differently: Take 10 mg by mouth daily with breakfast. 07/19/22   Cherlyn Labella, MD  pregabalin  (LYRICA ) 100 MG capsule Take 100 mg by mouth at bedtime.    [provider]  progesterone  (PROMETRIUM ) 100 MG capsule Take 100 mg by mouth at bedtime.    [provider]  pyridostigmine  (MESTINON ) 60 MG tablet Take 30 mg by mouth 3 (three) times daily as needed (for Myasthenia gravis symptoms).    [provider]  rOPINIRole  (REQUIP ) 1 MG tablet Take 1-2 tablets (1-2 mg total) by mouth at bedtime. 08/08/19   [place], Betty G, MD  solifenacin (VESICARE) 10 MG tablet Take 10 mg by mouth at bedtime.    [provider]  Spacer/Aero-Holding Chambers (AEROCHAMBER PLUS) inhaler Use as instructed to use with inahaler. 08/30/18   [place], Betty G, MD  spironolactone  (ALDACTONE ) 25 MG tablet Take 25 mg by mouth daily.    [provider]   SYMBICORT  80-4.5 MCG/ACT inhaler Inhale 2 puffs into the lungs 2 (two) times daily. Patient taking differently: Inhale 2 puffs into the lungs in the morning and at bedtime. 09/05/19   [place], Betty G, MD  Vitamin D , Ergocalciferol , (DRISDOL ) 1.25 MG (50000 UNIT) CAPS capsule Take 50,000 Units by mouth every 14 (fourteen) days.    [provider]    Allergies: Cefixime, Citrus, Duloxetine, Other, Rosuvastatin , Tapentadol, Latex, Lorazepam , Orange concentrate [flavoring agent (non-screening)], Oxycodone  hcl, Percocet [oxycodone -acetaminophen ], Sulfa antibiotics, Diazepam, Statins, Tape, and Bioflavonoids    Review of Systems  Gastrointestinal:  Positive for vomiting.    Updated Vital Signs BP 132/71  Pulse 81   Temp 97.9 F (36.6 C) (Oral)   Resp 17   Ht 5' (1.524 m)   Wt 74.8 kg   SpO2 96%   BMI 32.22 kg/m   Physical Exam Vitals and nursing note reviewed.  Constitutional:      General: She is not in acute distress.    Appearance: She is well-developed.  HENT:     Head: Normocephalic and atraumatic.  Eyes:     Conjunctiva/sclera: Conjunctivae normal.  Cardiovascular:     Rate and Rhythm: Normal rate and regular rhythm.     Heart sounds: No murmur heard. Pulmonary:     Effort: Pulmonary effort is normal. No respiratory distress.     Breath sounds: Normal breath sounds.  Abdominal:     Palpations: Abdomen is soft.     Tenderness: There is no abdominal tenderness.   Musculoskeletal:        General: No swelling.     Cervical back: Neck supple.  Skin:    General: Skin is warm and dry.     Capillary Refill: Capillary refill takes less than 2 seconds.  Neurological:     Mental Status: She is alert.  Psychiatric:        Mood and Affect: Mood normal.     (all labs ordered are listed, but only abnormal results are displayed) Labs Reviewed  COMPREHENSIVE METABOLIC PANEL WITH GFR - Abnormal; Notable for the following components:      Result Value   CO2 17 (*)     Glucose, Bld 140 (*)    Anion gap 16 (*)    All other components within normal limits  CBC - Abnormal; Notable for the following components:   WBC 15.4 (*)    Hemoglobin 15.8 (*)    HCT 46.9 (*)    All other components within normal limits  URINALYSIS, ROUTINE W REFLEX MICROSCOPIC - Abnormal; Notable for the following components:   APPearance HAZY (*)    Protein, ur 30 (*)    Bacteria, UA RARE (*)    All other components within normal limits  MAGNESIUM  - Abnormal; Notable for the following components:   Magnesium  1.3 (*)    All other components within normal limits  RESP PANEL BY RT-PCR (RSV, FLU A&B, COVID)  RVPGX2  GASTROINTESTINAL PANEL BY PCR, STOOL (REPLACES STOOL CULTURE)  LIPASE, BLOOD    EKG: EKG Interpretation Date/Time:  Saturday November 10 2023 08:01:13 EDT Ventricular Rate:  87 PR Interval:  140 QRS Duration:  96 QT Interval:  362 QTC Calculation: 436 R Axis:   53  Text Interpretation: Sinus rhythm Confirmed by Simon Rea [phone]) on 11/10/2023 9:49:47 AM  Radiology: CT ABDOMEN PELVIS W CONTRAST Result Date: 11/10/2023 CLINICAL DATA:  Generalized abdominal pain. Epigastric pain. Nausea and vomiting. EXAM: CT ABDOMEN AND PELVIS WITH CONTRAST TECHNIQUE: Multidetector CT imaging of the abdomen and pelvis was performed using the standard protocol following bolus administration of intravenous contrast. RADIATION DOSE REDUCTION: This exam was performed according to the departmental dose-optimization program which includes automated exposure control, adjustment of the mA and/or kV according to patient size and/or use of iterative reconstruction technique. CONTRAST:  OMNIPAQUE  IOHEXOL  300 MG/ML  SOLN COMPARISON:  07/10/2022 FINDINGS: Lower Chest: No acute findings. Hepatobiliary: 1.3 cm benign hemangioma is seen in the posteroinferior right hepatic lobe. Focal fatty infiltration seen adjacent to the gallbladder fossa. Prior cholecystectomy. No evidence of biliary  obstruction. Pancreas:  No mass or inflammatory changes. Spleen: Within normal limits  in size and appearance. Adrenals/Urinary Tract: No suspicious masses identified. No evidence of ureteral calculi or hydronephrosis. Unremarkable unopacified urinary bladder. Stomach/Bowel: No evidence of obstruction, inflammatory process or abnormal fluid collections. Normal appendix visualized. Vascular/Lymphatic: No pathologically enlarged lymph nodes. No acute vascular findings. Reproductive:  No mass or other significant abnormality. Other:  None. Musculoskeletal: No suspicious bone lesions identified. Lumbar spine fusion hardware again seen at L4-5. IMPRESSION: No acute findings within the abdomen or pelvis. Small benign hepatic hemangioma. Electronically Signed   By: Norleen DELENA Kil M.D.   On: 11/10/2023 10:42     Procedures   Medications Ordered in the ED  ondansetron  (ZOFRAN ) injection 4 mg (4 mg Intravenous Given 11/10/23 0829)  sodium chloride  0.9 % bolus 1,000 mL (0 mLs Intravenous Stopped 11/10/23 1104)  iohexol  (OMNIPAQUE ) 300 MG/ML solution 100 mL (100 mLs Intravenous Contrast Given 11/10/23 0943)  sodium chloride  0.9 % bolus 500 mL (0 mLs Intravenous Stopped 11/10/23 1231)  metoCLOPramide  (REGLAN ) injection 5 mg (5 mg Intravenous Given 11/10/23 1112)  morphine  (PF) 4 MG/ML injection 1 mg (1 mg Intravenous Given 11/10/23 1116)  magnesium  oxide (MAG-OX) tablet 400 mg (400 mg Oral Given 11/10/23 1201)                                    Medical Decision Making Amount and/or Complexity of Data Reviewed Labs: ordered. Radiology: ordered.  Risk OTC drugs. Prescription drug management. Decision regarding hospitalization.    She presents because of nausea vomit diarrhea.  Started last night around 11 PM.  No sick contacts.  Nobody else in her household has been sick.  Patient states that she been vomiting up green bile.  Clear diarrhea.  No blood that she can appreciate.  No dark stools.  No bright red  blood per rectum.  No hematemesis.  She does endorse a history of cholecystectomy.  She is never had anything like this happen before.  No recent antibiotic use.  Denies all chest pain or shortness of breath.  Endorses chills but no fevers. No history of C.diff    Previous medical history reviewed : Patient last seen in the hospital and discharged on July 13 because of a UTI.  Pansensitive E. coli.   Upon exam, patient hemodynamically stable.  Afebrile.  Normotensive.  96% on room air.    Laboratory workup showed leukocytosis.  Slight acidosis with a bicarb of 17.  Anion gap is 16.  Magnesium  1.3.   Consulted nausea vomit diarrhea, did obtain CT scan abdomen.  Wanted to evaluate for evidence diverticulitis versus colitis versus ileus versus obstruction versus other intra-abdominal pathology.  CT scan did not show any evidence acute abdominal pathology.   Exam of 1.3.  Will replace orally.  Patient had a very hard time tolerating p.o. liquids or pills.  Patient was given 1500 cc of fluid.  Ongoing episodes of emesis.   Pending stool GI pathogen panel. Ordered because of the diarrhea.    Patient be admitted for further evaluation.        Final diagnoses:  Nausea and vomiting, unspecified vomiting type  Nausea    ED Discharge Orders     None          Simon Lavonia SAILOR, MD 11/10/23 1259    Simon Lavonia SAILOR, MD 12/09/23 [phone]

## 2023-11-10 NOTE — ED Triage Notes (Signed)
 Pt began vomiting at 11pm last night. Pt feels weak and nauseous . Pt took zofran  4 mg prior to EMS arrival. 20 G Left hand and 250 ml of NS given in route. Pt is taking diflucan for a yeast infection x 4 days.

## 2023-11-10 NOTE — ED Notes (Signed)
 Patient transported to CT

## 2023-11-10 NOTE — Hospital Course (Signed)
 63 year old female with a history of diabetes mellitus type 2, hypertension, hyperlipidemia, myasthenia gravis, Crohn's ileocolitis, chronic pain with fibromyalgia on opioids, chronic leukemoid reaction, spinal stimulator placed 2019, B12 deficiency presenting with nausea, vomiting, diarrhea and upper abdominal pain that began on 11/09/2023. Notably, the patient was recently admitted to the hospital from 09/28/2023 to 09/30/2023 secondary to sepsis from E. coli UTI.  She was discharged home with cephalexin .  The patient stated that she continued to have dysuria.  She went back to see her PCP, and the patient was placed on 10 additional days of antibiotics which she finished in the beginning of August.  She denies any fevers, chills, chest pain, shortness of breath.  She has nonproductive cough.  She denies any hematemesis.  She has upper abdominal pain.  She has dysuria.  There is no hematuria, hematochezia, melena. She denies any recent travels.  She denies any sick contacts.  She denies eating any raw or undercooked foods. In the ED, the patient was afebrile and hemodynamically stable with oxygen saturation 96% on room air.  WBC 15.4, hemoglobin 15.8, platelets 219.  Sodium 137, potassium 3.9, bicarbonate 17, serum creatinine 0.90.  Magnesium  1.3.  LFTs were unremarkable.  Lipase 25.  CT abdomen and pelvis negative for any acute findings.  The patient was given 1.5 L normal saline.  He was given Reglan  and magnesium  and Zofran .  Morphine  4 mg was given.  She is admitted for intractable vomiting, abdominal pain and diarrhea.

## 2023-11-11 DIAGNOSIS — G7000 Myasthenia gravis without (acute) exacerbation: Secondary | ICD-10-CM

## 2023-11-11 DIAGNOSIS — R1110 Vomiting, unspecified: Secondary | ICD-10-CM | POA: Diagnosis not present

## 2023-11-11 DIAGNOSIS — E669 Obesity, unspecified: Secondary | ICD-10-CM | POA: Diagnosis not present

## 2023-11-11 DIAGNOSIS — F1120 Opioid dependence, uncomplicated: Secondary | ICD-10-CM | POA: Diagnosis not present

## 2023-11-11 LAB — CBC
HCT: 39.2 % (ref 36.0–46.0)
Hemoglobin: 13.2 g/dL (ref 12.0–15.0)
MCH: 33.2 pg (ref 26.0–34.0)
MCHC: 33.7 g/dL (ref 30.0–36.0)
MCV: 98.7 fL (ref 80.0–100.0)
Platelets: 165 K/uL (ref 150–400)
RBC: 3.97 MIL/uL (ref 3.87–5.11)
RDW: 14.9 % (ref 11.5–15.5)
WBC: 8.5 K/uL (ref 4.0–10.5)
nRBC: 0 % (ref 0.0–0.2)

## 2023-11-11 LAB — GASTROINTESTINAL PANEL BY PCR, STOOL (REPLACES STOOL CULTURE)

## 2023-11-11 LAB — BASIC METABOLIC PANEL WITH GFR
Anion gap: 10 (ref 5–15)
BUN: 11 mg/dL (ref 8–23)
CO2: 19 mmol/L — ABNORMAL LOW (ref 22–32)
Calcium: 7.5 mg/dL — ABNORMAL LOW (ref 8.9–10.3)
Chloride: 102 mmol/L (ref 98–111)
Creatinine, Ser: 0.86 mg/dL (ref 0.44–1.00)
GFR, Estimated: >60 mL/min (ref >60)
Glucose, Bld: 96 mg/dL (ref 70–99)
Potassium: 3.3 mmol/L — ABNORMAL LOW (ref 3.5–5.1)
Sodium: 131 mmol/L — ABNORMAL LOW (ref 135–145)

## 2023-11-11 LAB — MAGNESIUM: Magnesium: 1.3 mg/dL — ABNORMAL LOW (ref 1.7–2.4)

## 2023-11-11 MED ORDER — METHOCARBAMOL 500 MG PO TABS
1000.0000 mg | ORAL_TABLET | Freq: Four times a day (QID) | ORAL | Status: DC | PRN
  Administered 2023-11-11: 1000 mg via ORAL
  Filled 2023-11-11: qty 2

## 2023-11-11 MED ORDER — ROPINIROLE HCL 1 MG PO TABS
1.0000 mg | ORAL_TABLET | Freq: Every day | ORAL | Status: DC
  Administered 2023-11-11: 2 mg via ORAL
  Filled 2023-11-11: qty 2

## 2023-11-11 MED ORDER — POTASSIUM CHLORIDE 20 MEQ PO PACK
20.0000 meq | PACK | Freq: Once | ORAL | Status: AC
  Administered 2023-11-11: 20 meq via ORAL
  Filled 2023-11-11: qty 1

## 2023-11-11 MED ORDER — POTASSIUM CHLORIDE IN NACL 20-0.9 MEQ/L-% IV SOLN: INTRAVENOUS | Status: AC

## 2023-11-11 NOTE — Care Management Obs Status (Signed)
 MEDICARE OBSERVATION STATUS NOTIFICATION   Patient Details  Name: Ramla Hase MRN: [id number] Date of Birth: 01/16/1961   Medicare Observation Status Notification Given:  Yes    Nena LITTIE Coffee, RN 11/11/2023, 11:48 AM

## 2023-11-11 NOTE — Plan of Care (Signed)

## 2023-11-11 NOTE — Progress Notes (Signed)
   11/11/23 1149  TOC Brief Assessment  Insurance and Status Reviewed  Patient has primary care physician Yes  Home environment has been reviewed From home c/husband  Prior level of function: Independent  Prior/Current Home Services No current home services  Social Drivers of Health Review SDOH reviewed no interventions necessary  Readmission risk has been reviewed Yes  Transition of care needs no transition of care needs at this time   Transition of Care Department [facility]) has reviewed patient and no TOC needs have been identified at this time. We will continue to monitor patient advancement through interdisciplinary progression rounds. If new patient transition needs arise, please place a TOC consult.

## 2023-11-11 NOTE — Progress Notes (Addendum)
 PROGRESS NOTE  Teresa Lee FMW:[id number] DOB: [birth year] DOA: 11/10/2023 PCP: System, Provider Not In  Brief History:  63 year old female with a history of diabetes mellitus type 2, hypertension, hyperlipidemia, myasthenia gravis, Crohn's ileocolitis, chronic pain with fibromyalgia on opioids, chronic leukemoid reaction, spinal stimulator placed 2019, B12 deficiency presenting with nausea, vomiting, diarrhea and upper abdominal pain that began on 11/09/2023. Notably, the patient was recently admitted to the hospital from 09/28/2023 to 09/30/2023 secondary to sepsis from E. coli UTI.  She was discharged home with cephalexin .  The patient stated that she continued to have dysuria.  She went back to see her PCP, and the patient was placed on 10 additional days of antibiotics which she finished in the beginning of August.  She denies any fevers, chills, chest pain, shortness of breath.  She has nonproductive cough.  She denies any hematemesis.  She has upper abdominal pain.  She has dysuria.  There is no hematuria, hematochezia, melena. She denies any recent travels.  She denies any sick contacts.  She denies eating any raw or undercooked foods. In the ED, the patient was afebrile and hemodynamically stable with oxygen saturation 96% on room air.  WBC 15.4, hemoglobin 15.8, platelets 219.  Sodium 137, potassium 3.9, bicarbonate 17, serum creatinine 0.90.  Magnesium  1.3.  LFTs were unremarkable.  Lipase 25.  CT abdomen and pelvis negative for any acute findings.  The patient was given 1.5 L normal saline.  He was given Reglan  and magnesium  and Zofran .  Morphine  4 mg was given.  She is admitted for intractable vomiting, abdominal pain and diarrhea.   Assessment/Plan:  Intractable vomiting and diarrhea -Start Zofran  around-the-clock - Check stool for C. Difficile - Stool pathogen panel - Clear liquid diet>>regular diet - Start pantoprazole  twice daily - 11/10/2023 CT AP-- no acute  findings - Continue IV fluids - COVID/RSV/Flu neg - viral respiratory panel--neg - 8/24--improving clinically   Crohn's ileocolitis - Continue mesalamine    Myasthenia gravis - Continue mycophenolate  - Continue prednisone --give double dose for stress reaction   Opioid dependence/chronic pain syndrome - PDMP reviewed - norco 10/325, #120, last refill 10/19/23 - lyrica  100 mg, #30, last refill 10/31/23   Hypomagnesemia - Replete judiciously in MG   Diabetes mellitus type 2, controlled - Holding Actos and Amaryl - 10/11/2023 hemoglobin A1c 5.9 - Plan short-term sliding scale as the patient will be on higher prednisone  dose   Essential hypertension  -holding spironolactone  and losartan  to allow blood pressure margin - Currently BP controlled   Obesity - BMI 32.22 - Lifestyle modification  Hyponatremia -may be due to DDAVP  -hold DDAVP  today         Family Communication:   spouse at bedside 8/24  Consultants:  none  Code Status:  FULL   DVT Prophylaxis:  [place] Heparin    Procedures: As Listed in Progress Note Above  Antibiotics: None      Subjective: Patient denies fevers, chills, headache, chest pain, dyspnea, nausea, vomiting, diarrhea, abdominal pain, dysuria, hematuria, hematochezia, and melena.  Wants real food  vomiting and diarrhea improved  Objective: Vitals:   11/10/23 1411 11/10/23 1946 11/11/23 0100 11/11/23 0431  BP: 119/69 93/81  116/62  Pulse: 89 (!) 102  (!) 105  Resp: 16 18  18   Temp: 98 F (36.7 C) (!) 100.5 F (38.1 C) 99 F (37.2 C) 99.1 F (37.3 C)  TempSrc: Oral Oral  Oral  SpO2: 99% 100%  91%  Weight:      Height:        Intake/Output Summary (Last 24 hours) at 11/11/2023 1154 Last data filed at 11/11/2023 0800 Gross per 24 hour  Intake 2409.04 ml  Output --  Net 2409.04 ml   Weight change:  Exam:  General:  Pt is alert, follows commands appropriately, not in acute distress HEENT: No icterus, No thrush, No neck  mass, [place]/AT Cardiovascular: RRR, S1/S2, no rubs, no gallops Respiratory: CTA bilaterally, no wheezing, no crackles, no rhonchi Abdomen: Soft/+BS, non tender, non distended, no guarding Extremities: No edema, No lymphangitis, No petechiae, No rashes, no synovitis   Data Reviewed: I have personally reviewed following labs and imaging studies Basic Metabolic Panel: Recent Labs  Lab 11/10/23 0804 11/10/23 0818 11/11/23 0936  NA 137  --  131*  K 3.9  --  3.3*  CL 104  --  102  CO2 17*  --  19*  GLUCOSE 140*  --  96  BUN 23  --  11  CREATININE 0.90  --  0.86  CALCIUM  9.5  --  7.5*  MG  --  1.3* 1.3*   Liver Function Tests: Recent Labs  Lab 11/10/23 0804  AST 21  ALT 18  ALKPHOS 39  BILITOT 1.0  PROT 7.4  ALBUMIN  3.7   Recent Labs  Lab 11/10/23 0804  LIPASE 25   No results for input(s): AMMONIA in the last 168 hours. Coagulation Profile: No results for input(s): INR, PROTIME in the last 168 hours. CBC: Recent Labs  Lab 11/10/23 0804 11/11/23 0936  WBC 15.4* 8.5  HGB 15.8* 13.2  HCT 46.9* 39.2  MCV 97.9 98.7  PLT 219 165   Cardiac Enzymes: No results for input(s): CKTOTAL, CKMB, CKMBINDEX, TROPONINI in the last 168 hours. BNP: Invalid input(s): POCBNP CBG: No results for input(s): GLUCAP in the last 168 hours. HbA1C: No results for input(s): HGBA1C in the last 72 hours. Urine analysis:    Component Value Date/Time   COLORURINE YELLOW 11/10/2023 0849   APPEARANCEUR HAZY (A) 11/10/2023 0849   LABSPEC 1.021 11/10/2023 0849   PHURINE 5.0 11/10/2023 0849   GLUCOSEU NEGATIVE 11/10/2023 0849   HGBUR NEGATIVE 11/10/2023 0849   BILIRUBINUR NEGATIVE 11/10/2023 0849   KETONESUR NEGATIVE 11/10/2023 0849   PROTEINUR 30 (A) 11/10/2023 0849   NITRITE NEGATIVE 11/10/2023 0849   LEUKOCYTESUR NEGATIVE 11/10/2023 0849   Sepsis Labs: @LABRCNTIP (procalcitonin:4,lacticidven:4) ) Recent Results (from the past 240 hours)  Resp panel by RT-PCR  (RSV, Flu A&B, Covid) Anterior Nasal Swab     Status: None   Collection Time: 11/10/23  8:05 AM   Specimen: Anterior Nasal Swab  Result Value Ref Range Status   SARS Coronavirus 2 by RT PCR NEGATIVE NEGATIVE Final    Comment: (NOTE) SARS-CoV-2 target nucleic acids are NOT DETECTED.  The SARS-CoV-2 RNA is generally detectable in upper respiratory specimens during the acute phase of infection. The lowest concentration of SARS-CoV-2 viral copies this assay can detect is 138 copies/mL. A negative result does not preclude SARS-Cov-2 infection and should not be used as the sole basis for treatment or other patient management decisions. A negative result may occur with  improper specimen collection/handling, submission of specimen other than nasopharyngeal swab, presence of viral mutation(s) within the areas targeted by this assay, and inadequate number of viral copies(<138 copies/mL). A negative result must be combined with clinical observations, patient history, and epidemiological information. The expected result is Negative.  Fact Sheet for Patients:  BloggerCourse.com  Fact Sheet for Healthcare Providers:  SeriousBroker.it  This test is no t yet approved or cleared by the [place]  FDA and  has been authorized for detection and/or diagnosis of SARS-CoV-2 by FDA under an Emergency Use Authorization (EUA). This EUA will remain  in effect (meaning this test can be used) for the duration of the COVID-19 declaration under Section 564(b)(1) of the Act, 21 U.S.C.section 360bbb-3(b)(1), unless the authorization is terminated  or revoked sooner.       Influenza A by PCR NEGATIVE NEGATIVE Final   Influenza B by PCR NEGATIVE NEGATIVE Final    Comment: (NOTE) The Xpert Xpress SARS-CoV-2/FLU/RSV plus assay is intended as an aid in the diagnosis of influenza from Nasopharyngeal swab specimens and should not be used as a sole basis for  treatment. Nasal washings and aspirates are unacceptable for Xpert Xpress SARS-CoV-2/FLU/RSV testing.  Fact Sheet for Patients: BloggerCourse.com  Fact Sheet for Healthcare Providers: SeriousBroker.it  This test is not yet approved or cleared by the [place]  FDA and has been authorized for detection and/or diagnosis of SARS-CoV-2 by FDA under an Emergency Use Authorization (EUA). This EUA will remain in effect (meaning this test can be used) for the duration of the COVID-19 declaration under Section 564(b)(1) of the Act, 21 U.S.C. section 360bbb-3(b)(1), unless the authorization is terminated or revoked.     Resp Syncytial Virus by PCR NEGATIVE NEGATIVE Final    Comment: (NOTE) Fact Sheet for Patients: BloggerCourse.com  Fact Sheet for Healthcare Providers: SeriousBroker.it  This test is not yet approved or cleared by the [place]  FDA and has been authorized for detection and/or diagnosis of SARS-CoV-2 by FDA under an Emergency Use Authorization (EUA). This EUA will remain in effect (meaning this test can be used) for the duration of the COVID-19 declaration under Section 564(b)(1) of the Act, 21 U.S.C. section 360bbb-3(b)(1), unless the authorization is terminated or revoked.  Performed at [facility], [address], [place], [place] [address]   Respiratory (~20 pathogens) panel by PCR     Status: None   Collection Time: 11/10/23  3:50 PM   Specimen: Nasopharyngeal Swab; Respiratory  Result Value Ref Range Status   Adenovirus NOT DETECTED NOT DETECTED Final   Coronavirus 229E NOT DETECTED NOT DETECTED Final    Comment: (NOTE) The Coronavirus on the Respiratory Panel, DOES NOT test for the novel  Coronavirus (2019 nCoV)    Coronavirus HKU1 NOT DETECTED NOT DETECTED Final   Coronavirus NL63 NOT DETECTED NOT DETECTED Final   Coronavirus OC43 NOT DETECTED NOT  DETECTED Final   Metapneumovirus NOT DETECTED NOT DETECTED Final   Rhinovirus / Enterovirus NOT DETECTED NOT DETECTED Final   Influenza A NOT DETECTED NOT DETECTED Final   Influenza B NOT DETECTED NOT DETECTED Final   Parainfluenza Virus 1 NOT DETECTED NOT DETECTED Final   Parainfluenza Virus 2 NOT DETECTED NOT DETECTED Final   Parainfluenza Virus 3 NOT DETECTED NOT DETECTED Final   Parainfluenza Virus 4 NOT DETECTED NOT DETECTED Final   Respiratory Syncytial Virus NOT DETECTED NOT DETECTED Final   Bordetella pertussis NOT DETECTED NOT DETECTED Final   Bordetella Parapertussis NOT DETECTED NOT DETECTED Final   Chlamydophila pneumoniae NOT DETECTED NOT DETECTED Final   Mycoplasma pneumoniae NOT DETECTED NOT DETECTED Final    Comment: Performed at [facility], [address] [place],  [address]     Scheduled Meds:  desmopressin   0.1 mg Oral QHS   estradiol   2 mg Oral QHS  ezetimibe   10 mg Oral Daily   feeding supplement  1 Container Oral TID BM   fesoterodine   4 mg Oral Daily   heparin   5,000 Units Subcutaneous Q8H   magnesium  oxide  400 mg Oral Daily   Mesalamine   800 mg Oral BID   mycophenolate   1,000 mg Oral BID   pantoprazole  (PROTONIX ) IV  40 mg Intravenous BID   predniSONE   20 mg Oral Q breakfast   pregabalin   100 mg Oral QHS   prochlorperazine   5 mg Intravenous Q6H   progesterone   100 mg Oral QHS   Continuous Infusions:  lactated ringers  75 mL/hr at 11/10/23 1700    Procedures/Studies: CT ABDOMEN PELVIS W CONTRAST Result Date: 11/10/2023 CLINICAL DATA:  Generalized abdominal pain. Epigastric pain. Nausea and vomiting. EXAM: CT ABDOMEN AND PELVIS WITH CONTRAST TECHNIQUE: Multidetector CT imaging of the abdomen and pelvis was performed using the standard protocol following bolus administration of intravenous contrast. RADIATION DOSE REDUCTION: This exam was performed according to the departmental dose-optimization program which includes automated exposure  control, adjustment of the mA and/or kV according to patient size and/or use of iterative reconstruction technique. CONTRAST:  OMNIPAQUE  IOHEXOL  300 MG/ML  SOLN COMPARISON:  07/10/2022 FINDINGS: Lower Chest: No acute findings. Hepatobiliary: 1.3 cm benign hemangioma is seen in the posteroinferior right hepatic lobe. Focal fatty infiltration seen adjacent to the gallbladder fossa. Prior cholecystectomy. No evidence of biliary obstruction. Pancreas:  No mass or inflammatory changes. Spleen: Within normal limits in size and appearance. Adrenals/Urinary Tract: No suspicious masses identified. No evidence of ureteral calculi or hydronephrosis. Unremarkable unopacified urinary bladder. Stomach/Bowel: No evidence of obstruction, inflammatory process or abnormal fluid collections. Normal appendix visualized. Vascular/Lymphatic: No pathologically enlarged lymph nodes. No acute vascular findings. Reproductive:  No mass or other significant abnormality. Other:  None. Musculoskeletal: No suspicious bone lesions identified. Lumbar spine fusion hardware again seen at L4-5. IMPRESSION: No acute findings within the abdomen or pelvis. Small benign hepatic hemangioma. Electronically Signed   By: Norleen DELENA Kil M.D.   On: 11/10/2023 10:42    Alm Schneider, DO  [facility]  If 7PM-7AM, please contact night-coverage [email or web address] Password [email or web address] 11/11/2023, 11:54 AM   LOS: 0 days

## 2023-11-11 NOTE — Discharge Summary (Incomplete)
 Physician Discharge Summary   Patient: Teresa Lee MRN: [id number] DOB: 1960-10-25  Admit date:     11/10/2023  Discharge date: 11/12/23  Discharge Physician: Alm Robbie Nangle   PCP: System, Provider Not In   Recommendations at discharge:   Please follow up with primary care provider within 1-2 weeks  Please repeat BMP and CBC in one week   Hospital Course: 63 year old female with a history of diabetes mellitus type 2, hypertension, hyperlipidemia, myasthenia gravis, Crohn's ileocolitis, chronic pain with fibromyalgia on opioids, chronic leukemoid reaction, spinal stimulator placed 2019, B12 deficiency presenting with nausea, vomiting, diarrhea and upper abdominal pain that began on 11/09/2023. Notably, the patient was recently admitted to the hospital from 09/28/2023 to 09/30/2023 secondary to sepsis from E. coli UTI.  She was discharged home with cephalexin .  The patient stated that she continued to have dysuria.  She went back to see her PCP, and the patient was placed on 10 additional days of antibiotics which she finished in the beginning of August.  She denies any fevers, chills, chest pain, shortness of breath.  She has nonproductive cough.  She denies any hematemesis.  She has upper abdominal pain.  She has dysuria.  There is no hematuria, hematochezia, melena. She denies any recent travels.  She denies any sick contacts.  She denies eating any raw or undercooked foods. In the ED, the patient was afebrile and hemodynamically stable with oxygen saturation 96% on room air.  WBC 15.4, hemoglobin 15.8, platelets 219.  Sodium 137, potassium 3.9, bicarbonate 17, serum creatinine 0.90.  Magnesium  1.3.  LFTs were unremarkable.  Lipase 25.  CT abdomen and pelvis negative for any acute findings.  The patient was given 1.5 L normal saline.  He was given Reglan  and magnesium  and Zofran .  Morphine  4 mg was given.  She is admitted for intractable vomiting, abdominal pain and diarrhea.  Assessment and  Plan: Intractable vomiting and diarrhea - due to norovirus gastroenteritis -Start Zofran  around-the-clock - Check stool for C. Difficile - Stool pathogen panel + Norovirus - Clear liquid diet>>regular diet - Start pantoprazole  twice daily - 11/10/2023 CT AP-- no acute findings - Continue IV fluids - COVID/RSV/Flu neg - viral respiratory panel--neg - 8/24--improving clinically   Crohn's ileocolitis - Continue mesalamine    Myasthenia gravis - Continue mycophenolate  - Continue prednisone --give double dose for stress reaction   Opioid dependence/chronic pain syndrome - PDMP reviewed - norco 10/325, #120, last refill 10/19/23 - lyrica  100 mg, #30, last refill 10/31/23   Hypomagnesemia - Replete judiciously in MG   Diabetes mellitus type 2, controlled - Holding Actos and Amaryl - 10/11/2023 hemoglobin A1c 5.9 - Plan short-term sliding scale as the patient will be on higher prednisone  dose   Essential hypertension  -holding spironolactone  and losartan  to allow blood pressure margin - Currently BP controlled   Obesity - BMI 32.22 - Lifestyle modification   Hyponatremia -may be due to DDAVP  -hold DDAVP  8/24  {Tip this will not be part of the note when signed Body mass index is 32.22 kg/m. , ,  (Optional):26781}  {(NOTE) Pain control PDMP Statment (Optional):26782} Consultants: none Procedures performed: none  Disposition: Home Diet recommendation:  Carb modified diet DISCHARGE MEDICATION: Allergies as of 11/11/2023       Reactions   Rosuvastatin  Other (See Comments)   Rhabdomyolysis   Tapentadol Other (See Comments)   Headaches (Nucynta)   Cefixime Hives, Rash, Other (See Comments)   Due to myasthenia gravis   Citrus Dermatitis  Causes burning of skin and blisters   Diazepam Other (See Comments)   Hallucinations   Latex Other (See Comments)   Blisters    Lorazepam  Other (See Comments)   Visual hallucinations   Orange Concentrate Quarry manager  (non-screening)] Other (See Comments)   Acid reflux   Other Hives, Nausea Only, Dermatitis, Rash, Other (See Comments)   Some nuts cause RASHES, some are tolerated   Oxycodone  Hcl Hives   Percocet [oxycodone -acetaminophen ] Hives   Statins Other (See Comments)   Statin induced myositis from rosuvastatin . DO NOT Attempt to try other statins.   Sulfa Antibiotics Hives, Nausea And Vomiting   Tape Other (See Comments)   Blisters and burns the skin   Bioflavonoids Rash   Causes burning of skin and blisters   Duloxetine Nausea Only   Cymbalta      Med Rec must be completed prior to using this [facility]***       Discharge Exam: Filed Weights   11/10/23 0801  Weight: 74.8 kg   HEENT:  [place]/AT, No thrush, no icterus CV:  RRR, no rub, no S3, no S4 Lung:  CTA, no wheeze, no rhonchi Abd:  soft/+BS, NT Ext:  No edema, no lymphangitis, no synovitis, no rash   Condition at discharge: stable  The results of significant diagnostics from this hospitalization (including imaging, microbiology, ancillary and laboratory) are listed below for reference.   Imaging Studies: CT ABDOMEN PELVIS W CONTRAST Result Date: 11/10/2023 CLINICAL DATA:  Generalized abdominal pain. Epigastric pain. Nausea and vomiting. EXAM: CT ABDOMEN AND PELVIS WITH CONTRAST TECHNIQUE: Multidetector CT imaging of the abdomen and pelvis was performed using the standard protocol following bolus administration of intravenous contrast. RADIATION DOSE REDUCTION: This exam was performed according to the departmental dose-optimization program which includes automated exposure control, adjustment of the mA and/or kV according to patient size and/or use of iterative reconstruction technique. CONTRAST:  OMNIPAQUE  IOHEXOL  300 MG/ML  SOLN COMPARISON:  07/10/2022 FINDINGS: Lower Chest: No acute findings. Hepatobiliary: 1.3 cm benign hemangioma is seen in the posteroinferior right hepatic lobe. Focal fatty infiltration seen adjacent to  the gallbladder fossa. Prior cholecystectomy. No evidence of biliary obstruction. Pancreas:  No mass or inflammatory changes. Spleen: Within normal limits in size and appearance. Adrenals/Urinary Tract: No suspicious masses identified. No evidence of ureteral calculi or hydronephrosis. Unremarkable unopacified urinary bladder. Stomach/Bowel: No evidence of obstruction, inflammatory process or abnormal fluid collections. Normal appendix visualized. Vascular/Lymphatic: No pathologically enlarged lymph nodes. No acute vascular findings. Reproductive:  No mass or other significant abnormality. Other:  None. Musculoskeletal: No suspicious bone lesions identified. Lumbar spine fusion hardware again seen at L4-5. IMPRESSION: No acute findings within the abdomen or pelvis. Small benign hepatic hemangioma. Electronically Signed   By: Norleen DELENA Kil M.D.   On: 11/10/2023 10:42    Microbiology: Results for orders placed or performed during the hospital encounter of 11/10/23  Resp panel by RT-PCR (RSV, Flu A&B, Covid) Anterior Nasal Swab     Status: None   Collection Time: 11/10/23  8:05 AM   Specimen: Anterior Nasal Swab  Result Value Ref Range Status   SARS Coronavirus 2 by RT PCR NEGATIVE NEGATIVE Final    Comment: (NOTE) SARS-CoV-2 target nucleic acids are NOT DETECTED.  The SARS-CoV-2 RNA is generally detectable in upper respiratory specimens during the acute phase of infection. The lowest concentration of SARS-CoV-2 viral copies this assay can detect is 138 copies/mL. A negative result does not preclude SARS-Cov-2 infection and should not be  used as the sole basis for treatment or other patient management decisions. A negative result may occur with  improper specimen collection/handling, submission of specimen other than nasopharyngeal swab, presence of viral mutation(s) within the areas targeted by this assay, and inadequate number of viral copies(<138 copies/mL). A negative result must be combined  with clinical observations, patient history, and epidemiological information. The expected result is Negative.  Fact Sheet for Patients:  BloggerCourse.com  Fact Sheet for Healthcare Providers:  SeriousBroker.it  This test is no t yet approved or cleared by the [place]  FDA and  has been authorized for detection and/or diagnosis of SARS-CoV-2 by FDA under an Emergency Use Authorization (EUA). This EUA will remain  in effect (meaning this test can be used) for the duration of the COVID-19 declaration under Section 564(b)(1) of the Act, 21 U.S.C.section 360bbb-3(b)(1), unless the authorization is terminated  or revoked sooner.       Influenza A by PCR NEGATIVE NEGATIVE Final   Influenza B by PCR NEGATIVE NEGATIVE Final    Comment: (NOTE) The Xpert Xpress SARS-CoV-2/FLU/RSV plus assay is intended as an aid in the diagnosis of influenza from Nasopharyngeal swab specimens and should not be used as a sole basis for treatment. Nasal washings and aspirates are unacceptable for Xpert Xpress SARS-CoV-2/FLU/RSV testing.  Fact Sheet for Patients: BloggerCourse.com  Fact Sheet for Healthcare Providers: SeriousBroker.it  This test is not yet approved or cleared by the [place]  FDA and has been authorized for detection and/or diagnosis of SARS-CoV-2 by FDA under an Emergency Use Authorization (EUA). This EUA will remain in effect (meaning this test can be used) for the duration of the COVID-19 declaration under Section 564(b)(1) of the Act, 21 U.S.C. section 360bbb-3(b)(1), unless the authorization is terminated or revoked.     Resp Syncytial Virus by PCR NEGATIVE NEGATIVE Final    Comment: (NOTE) Fact Sheet for Patients: BloggerCourse.com  Fact Sheet for Healthcare Providers: SeriousBroker.it  This test is not yet approved  or cleared by the [place]  FDA and has been authorized for detection and/or diagnosis of SARS-CoV-2 by FDA under an Emergency Use Authorization (EUA). This EUA will remain in effect (meaning this test can be used) for the duration of the COVID-19 declaration under Section 564(b)(1) of the Act, 21 U.S.C. section 360bbb-3(b)(1), unless the authorization is terminated or revoked.  Performed at [facility], [address] [place], [place] [address]   Gastrointestinal Panel by PCR , Stool     Status: Abnormal   Collection Time: 11/10/23  9:28 AM   Specimen: Stool  Result Value Ref Range Status   Campylobacter species NOT DETECTED NOT DETECTED Final   Plesimonas shigelloides NOT DETECTED NOT DETECTED Final   Salmonella species NOT DETECTED NOT DETECTED Final   Yersinia enterocolitica NOT DETECTED NOT DETECTED Final   Vibrio species NOT DETECTED NOT DETECTED Final   Vibrio cholerae NOT DETECTED NOT DETECTED Final   Enteroaggregative E coli (EAEC) NOT DETECTED NOT DETECTED Final   Enteropathogenic E coli (EPEC) NOT DETECTED NOT DETECTED Final   Enterotoxigenic E coli (ETEC) NOT DETECTED NOT DETECTED Final   Shiga like toxin producing E coli (STEC) NOT DETECTED NOT DETECTED Final   Shigella/Enteroinvasive E coli (EIEC) NOT DETECTED NOT DETECTED Final   Cryptosporidium NOT DETECTED NOT DETECTED Final   Cyclospora cayetanensis NOT DETECTED NOT DETECTED Final   Entamoeba histolytica NOT DETECTED NOT DETECTED Final   Giardia lamblia NOT DETECTED NOT DETECTED Final   Adenovirus F40/41 NOT DETECTED NOT  DETECTED Final   Astrovirus NOT DETECTED NOT DETECTED Final   Norovirus GI/GII DETECTED (A) NOT DETECTED Final    Comment: RESULT CALLED TO, READ BACK BY AND VERIFIED WITH: FRANCHESICA BELL 11/11/23 1402 SLM    Rotavirus A NOT DETECTED NOT DETECTED Final   Sapovirus (I, II, IV, and V) NOT DETECTED NOT DETECTED Final    Comment: Performed at [facility], [address]  [place], [place] [address]  Respiratory (~20 pathogens) panel by PCR     Status: None   Collection Time: 11/10/23  3:50 PM   Specimen: Nasopharyngeal Swab; Respiratory  Result Value Ref Range Status   Adenovirus NOT DETECTED NOT DETECTED Final   Coronavirus 229E NOT DETECTED NOT DETECTED Final    Comment: (NOTE) The Coronavirus on the Respiratory Panel, DOES NOT test for the novel  Coronavirus (2019 nCoV)    Coronavirus HKU1 NOT DETECTED NOT DETECTED Final   Coronavirus NL63 NOT DETECTED NOT DETECTED Final   Coronavirus OC43 NOT DETECTED NOT DETECTED Final   Metapneumovirus NOT DETECTED NOT DETECTED Final   Rhinovirus / Enterovirus NOT DETECTED NOT DETECTED Final   Influenza A NOT DETECTED NOT DETECTED Final   Influenza B NOT DETECTED NOT DETECTED Final   Parainfluenza Virus 1 NOT DETECTED NOT DETECTED Final   Parainfluenza Virus 2 NOT DETECTED NOT DETECTED Final   Parainfluenza Virus 3 NOT DETECTED NOT DETECTED Final   Parainfluenza Virus 4 NOT DETECTED NOT DETECTED Final   Respiratory Syncytial Virus NOT DETECTED NOT DETECTED Final   Bordetella pertussis NOT DETECTED NOT DETECTED Final   Bordetella Parapertussis NOT DETECTED NOT DETECTED Final   Chlamydophila pneumoniae NOT DETECTED NOT DETECTED Final   Mycoplasma pneumoniae NOT DETECTED NOT DETECTED Final    Comment: Performed at [facility], [address] [place], [place] [address]    Labs: CBC: Recent Labs  Lab 11/10/23 0804 11/11/23 0936  WBC 15.4* 8.5  HGB 15.8* 13.2  HCT 46.9* 39.2  MCV 97.9 98.7  PLT 219 165   Basic Metabolic Panel: Recent Labs  Lab 11/10/23 0804 11/10/23 0818 11/11/23 0936  NA 137  --  131*  K 3.9  --  3.3*  CL 104  --  102  CO2 17*  --  19*  GLUCOSE 140*  --  96  BUN 23  --  11  CREATININE 0.90  --  0.86  CALCIUM  9.5  --  7.5*  MG  --  1.3* 1.3*   Liver Function Tests: Recent Labs  Lab 11/10/23 0804  AST 21  ALT 18  ALKPHOS 39  BILITOT 1.0  PROT 7.4  ALBUMIN  3.7    CBG: No results for input(s): GLUCAP in the last 168 hours.  Discharge time spent: greater than 30 minutes.  Signed: Alm Schneider, MD [facility] 11/11/2023

## 2023-11-12 DIAGNOSIS — A0811 Acute gastroenteropathy due to Norwalk agent: Secondary | ICD-10-CM

## 2023-11-12 DIAGNOSIS — G7000 Myasthenia gravis without (acute) exacerbation: Secondary | ICD-10-CM | POA: Diagnosis not present

## 2023-11-12 DIAGNOSIS — R1110 Vomiting, unspecified: Secondary | ICD-10-CM | POA: Diagnosis not present

## 2023-11-12 DIAGNOSIS — F1120 Opioid dependence, uncomplicated: Secondary | ICD-10-CM | POA: Diagnosis not present

## 2023-11-12 LAB — CBC
HCT: 40.6 % (ref 36.0–46.0)
Hemoglobin: 13.6 g/dL (ref 12.0–15.0)
MCH: 32.4 pg (ref 26.0–34.0)
MCHC: 33.5 g/dL (ref 30.0–36.0)
MCV: 96.7 fL (ref 80.0–100.0)
Platelets: 159 K/uL (ref 150–400)
RBC: 4.2 MIL/uL (ref 3.87–5.11)
RDW: 14.3 % (ref 11.5–15.5)
WBC: 5.8 K/uL (ref 4.0–10.5)
nRBC: 0 % (ref 0.0–0.2)

## 2023-11-12 LAB — BASIC METABOLIC PANEL WITH GFR
Anion gap: 7 (ref 5–15)
BUN: 10 mg/dL (ref 8–23)
CO2: 20 mmol/L — ABNORMAL LOW (ref 22–32)
Calcium: 7.5 mg/dL — ABNORMAL LOW (ref 8.9–10.3)
Chloride: 110 mmol/L (ref 98–111)
Creatinine, Ser: 0.74 mg/dL (ref 0.44–1.00)
GFR, Estimated: >60 mL/min (ref >60)
Glucose, Bld: 139 mg/dL — ABNORMAL HIGH (ref 70–99)
Potassium: 4 mmol/L (ref 3.5–5.1)
Sodium: 137 mmol/L (ref 135–145)

## 2023-11-12 LAB — MAGNESIUM: Magnesium: 1.7 mg/dL (ref 1.7–2.4)

## 2023-11-12 NOTE — Progress Notes (Signed)
 Reviewed discharge with patient, patient verbalized understanding. Waiting for family to bring clothes

## 2023-11-16 LAB — CULTURE, BLOOD (ROUTINE X 2)
Culture: NO GROWTH
Culture: NO GROWTH

## 2023-12-06 ENCOUNTER — Inpatient Hospital Stay: Attending: Hematology & Oncology

## 2023-12-06 ENCOUNTER — Encounter: Payer: Self-pay | Admitting: Hematology & Oncology

## 2023-12-06 ENCOUNTER — Inpatient Hospital Stay (HOSPITAL_BASED_OUTPATIENT_CLINIC_OR_DEPARTMENT_OTHER): Admitting: Hematology & Oncology

## 2023-12-06 DIAGNOSIS — R1110 Vomiting, unspecified: Secondary | ICD-10-CM | POA: Insufficient documentation

## 2023-12-06 DIAGNOSIS — D72823 Leukemoid reaction: Secondary | ICD-10-CM

## 2023-12-06 DIAGNOSIS — R197 Diarrhea, unspecified: Secondary | ICD-10-CM | POA: Insufficient documentation

## 2023-12-06 LAB — CMP (CANCER CENTER ONLY)
ALT: 9 U/L (ref 0–44)
AST: 18 U/L (ref 15–41)
Albumin: 4 g/dL (ref 3.5–5.0)
Alkaline Phosphatase: 39 U/L (ref 38–126)
Anion gap: 18 — ABNORMAL HIGH (ref 5–15)
BUN: 19 mg/dL (ref 8–23)
CO2: 16 mmol/L — ABNORMAL LOW (ref 22–32)
Calcium: 9.2 mg/dL (ref 8.9–10.3)
Chloride: 103 mmol/L (ref 98–111)
Creatinine: 1.11 mg/dL — ABNORMAL HIGH (ref 0.44–1.00)
GFR, Estimated: 56 mL/min — ABNORMAL LOW (ref >60)
Glucose, Bld: 176 mg/dL — ABNORMAL HIGH (ref 70–99)
Potassium: 4.1 mmol/L (ref 3.5–5.1)
Sodium: 138 mmol/L (ref 135–145)
Total Bilirubin: 0.4 mg/dL (ref 0.0–1.2)
Total Protein: 6.5 g/dL (ref 6.5–8.1)

## 2023-12-06 LAB — CBC WITH DIFFERENTIAL (CANCER CENTER ONLY)
Abs Immature Granulocytes: 0.94 K/uL — ABNORMAL HIGH (ref 0.00–0.07)
Basophils Absolute: 0.2 K/uL — ABNORMAL HIGH (ref 0.0–0.1)
Basophils Relative: 2 %
Eosinophils Absolute: 0.2 K/uL (ref 0.0–0.5)
Eosinophils Relative: 2 %
HCT: 40 % (ref 36.0–46.0)
Hemoglobin: 13.3 g/dL (ref 12.0–15.0)
Immature Granulocytes: 7 %
Lymphocytes Relative: 24 %
Lymphs Abs: 3.1 K/uL (ref 0.7–4.0)
MCH: 32.6 pg (ref 26.0–34.0)
MCHC: 33.3 g/dL (ref 30.0–36.0)
MCV: 98 fL (ref 80.0–100.0)
Monocytes Absolute: 0.9 K/uL (ref 0.1–1.0)
Monocytes Relative: 7 %
Neutro Abs: 7.7 K/uL (ref 1.7–7.7)
Neutrophils Relative %: 58 %
Platelet Count: 210 K/uL (ref 150–400)
RBC: 4.08 MIL/uL (ref 3.87–5.11)
RDW: 14.3 % (ref 11.5–15.5)
Smear Review: NORMAL
WBC Count: 13.2 K/uL — ABNORMAL HIGH (ref 4.0–10.5)
nRBC: 0 % (ref 0.0–0.2)

## 2023-12-06 LAB — IRON AND IRON BINDING CAPACITY (CC-WL,HP ONLY)
Iron: 117 ug/dL (ref 28–170)
Saturation Ratios: 24 % (ref 10.4–31.8)
TIBC: 498 ug/dL — ABNORMAL HIGH (ref 250–450)
UIBC: 381 ug/dL

## 2023-12-06 LAB — SAVE SMEAR(SSMR), FOR PROVIDER SLIDE REVIEW

## 2023-12-06 LAB — FERRITIN: Ferritin: 37 ng/mL (ref 11–307)

## 2023-12-06 NOTE — Progress Notes (Signed)
 Hematology and Oncology Follow Up Visit  Teresa Lee [id number] September 30, 1960 62 y.o. 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  We first saw about a month or so ago.  At that time, we did do studies for hemochromatosis.  She does have hemochromatosis.  She is a double heterozygote.  We will have to see what her iron studies show.  A couple weeks ago, she had norovirus.  She was hospitalized for 3 days.  She also had the toenail removed from her first toe on the left foot.  This got infected.  Her white cell count is up a little bit.  However, really not all that surprised by this given what she has been through lately.  She has had no issues with nausea or vomiting right now.  She has had no problems with bowels or bladder.  She has had no cough.  She has had no rashes.  She has had no swollen lymph nodes.  She has had no bleeding.  Overall, I would have said that her performance status is probably ECOG 1.  Medications:  Current Outpatient Medications:    albuterol  (PROVENTIL ) (2.5 MG/3ML) 0.083% nebulizer solution, Take 2.5 mg by nebulization every 6 (six) hours as needed for shortness of breath or wheezing., Disp: , Rfl:    albuterol  (VENTOLIN  HFA) 108 (90 Base) MCG/ACT inhaler, 1 PUFF EVERY 6 HOURS AS NEEDED FOR WHEEZING (Patient taking differently: Inhale 1 puff into the lungs every 6 (six) hours as needed for wheezing.), Disp: 18 g, Rfl: 2   Blood Glucose Monitoring Suppl DEVI, 1 each by Does not apply route in the morning, at noon, and at bedtime. May substitute to any manufacturer covered by patient's insurance., Disp: 1 each, Rfl: 0   cyanocobalamin  (,VITAMIN B-12,) 1000 MCG/ML injection, Inject 1,000 mcg into the muscle See admin instructions. Inject 1,000 mcg into the muscle every 6-8  weeks, Disp: , Rfl:    desmopressin  (DDAVP ) 0.2 MG tablet, Take 0.2 mg by mouth at bedtime. (Patient taking differently: Take 0.1 mg by mouth at bedtime.), Disp: , Rfl:    estradiol  (ESTRACE ) 2 MG tablet, Take 2 mg by mouth at bedtime., Disp: , Rfl:    ezetimibe  (ZETIA ) 10 MG tablet, Take 10 mg by mouth daily., Disp: , Rfl:    glimepiride (AMARYL) 1 MG tablet, Take 1 mg by mouth daily with breakfast., Disp: , Rfl:    HYDROcodone -acetaminophen  (NORCO) 10-325 MG tablet, Take 1 tablet by mouth every 4 (four) hours., Disp: , Rfl:    ketoconazole  (NIZORAL ) 2 % cream, Apply 1 Application topically daily., Disp: , Rfl:    losartan  (COZAAR ) 100 MG tablet, Take 100 mg by mouth daily., Disp: , Rfl:    Mesalamine  (ASACOL ) 400 MG CPDR DR capsule, Take 800 mg by mouth in the morning and at bedtime. (Patient taking differently: Take 400 mg by mouth in the morning and at bedtime.), Disp: , Rfl:    methocarbamol  (ROBAXIN ) 500 MG tablet, Take 500-1,000 mg by mouth See admin instructions. Take 500-1,000 mg by mouth at bedtime and an additional 500 mg once a day as needed for muscle spasms, Disp: , Rfl:    metoprolol  succinate (TOPROL -XL) 25 MG 24 hr tablet, Take 25 mg by mouth daily., Disp: , Rfl:    mycophenolate  (CELLCEPT ) 500 MG  tablet, Take 1,000 mg by mouth in the morning and at bedtime., Disp: , Rfl:    naloxone  (NARCAN ) nasal spray 4 mg/0.1 mL, Place 1 spray into the nose once as needed (opioid overdose)., Disp: , Rfl:    omega-3 acid ethyl esters (LOVAZA ) 1 g capsule, Take 1 g by mouth 4 (four) times daily. (Patient taking differently: Take 1 g by mouth 2 (two) times daily.), Disp: , Rfl:    ondansetron  (ZOFRAN -ODT) 8 MG disintegrating tablet, Take 8 mg by mouth every 8 (eight) hours as needed for nausea or vomiting., Disp: , Rfl:    pantoprazole  (PROTONIX ) 40 MG tablet, TAKE 1 TABLET DAILY (Patient taking differently: Take 40 mg by mouth daily.), Disp: 90 tablet, Rfl: 3   pioglitazone (ACTOS) 15 MG tablet,  Take 15 mg by mouth daily., Disp: , Rfl:    potassium chloride  SA (KLOR-CON  M) 20 MEQ tablet, Take 20 mEq by mouth in the morning and at bedtime. (Patient taking differently: Take 20 mEq by mouth 2 (two) times daily.), Disp: , Rfl:    predniSONE  (DELTASONE ) 10 MG tablet, Prednisone  50 mg daily for 3 days followed by  Prednisone  40 mg daily for 5 days followed by  Prednisone  20 mg daily for 5 days followed by  Prednisone  10 mg daily (Patient taking differently: Take 10 mg by mouth daily with breakfast.), Disp: 45 tablet, Rfl: 0   pregabalin  (LYRICA ) 100 MG capsule, Take 100 mg by mouth at bedtime., Disp: , Rfl:    progesterone  (PROMETRIUM ) 100 MG capsule, Take 100 mg by mouth at bedtime., Disp: , Rfl:    pyridostigmine  (MESTINON ) 60 MG tablet, Take 30 mg by mouth 3 (three) times daily as needed (for Myasthenia gravis symptoms)., Disp: , Rfl:    rOPINIRole  (REQUIP ) 1 MG tablet, Take 1-2 tablets (1-2 mg total) by mouth at bedtime., Disp: 180 tablet, Rfl: 1   solifenacin (VESICARE) 10 MG tablet, Take 10 mg by mouth at bedtime., Disp: , Rfl:    Spacer/Aero-Holding Chambers (AEROCHAMBER PLUS) inhaler, Use as instructed to use with inahaler., Disp: 1 each, Rfl: 1   spironolactone  (ALDACTONE ) 25 MG tablet, Take 25 mg by mouth daily., Disp: , Rfl:    SYMBICORT  80-4.5 MCG/ACT inhaler, Inhale 2 puffs into the lungs 2 (two) times daily. (Patient taking differently: Inhale 2 puffs into the lungs in the morning and at bedtime.), Disp: 10.2 g, Rfl: 6   valACYclovir (VALTREX) 500 MG tablet, Take 500 mg by mouth daily as needed (fever blisters)., Disp: , Rfl:    Vitamin D , Ergocalciferol , (DRISDOL ) 1.25 MG (50000 UNIT) CAPS capsule, Take 50,000 Units by mouth every 14 (fourteen) days., Disp: , Rfl:   Allergies:  Allergies  Allergen Reactions   Rosuvastatin  Other (See Comments)    Rhabdomyolysis   Tapentadol Other (See Comments)    Headaches (Nucynta)   Cefixime Hives, Rash and Other (See Comments)    Due to  myasthenia gravis   Citrus Dermatitis    Causes burning of skin and blisters   Diazepam Other (See Comments)    Hallucinations    Latex Other (See Comments)    Blisters    Lorazepam  Other (See Comments)    Visual hallucinations   Orange Concentrate [Flavoring Agent (Non-Screening)] Other (See Comments)    Acid reflux   Other Hives, Nausea Only, Dermatitis, Rash and Other (See Comments)    Some nuts cause RASHES, some are tolerated   Oxycodone  Hcl Hives   Percocet [Oxycodone -Acetaminophen ] Hives   Statins  Other (See Comments)    Statin induced myositis from rosuvastatin . DO NOT Attempt to try other statins.   Sulfa Antibiotics Hives and Nausea And Vomiting   Tape Other (See Comments)    Blisters and burns the skin   Bioflavonoids Rash    Causes burning of skin and blisters   Duloxetine Nausea Only    Cymbalta     Past Medical History, Surgical history, Social history, and Family History were reviewed and updated.  Review of Systems: Review of Systems  Constitutional: Negative.   HENT:  Negative.    Eyes: Negative.   Respiratory: Negative.    Cardiovascular: Negative.   Gastrointestinal:  Positive for diarrhea and vomiting.  Endocrine: Negative.   Genitourinary: Negative.    Musculoskeletal:  Positive for arthralgias and myalgias.  Skin: Negative.   Neurological: Negative.   Hematological: Negative.   Psychiatric/Behavioral: Negative.      Physical Exam:  weight is 162 lb 1.9 oz (73.5 kg). Her oral temperature is 98.3 F (36.8 C). Her blood pressure is 129/76 and her pulse is 84. Her respiration is 18 and oxygen saturation is 98%.   Wt Readings from Last 3 Encounters:  12/06/23 162 lb 1.9 oz (73.5 kg)  11/10/23 165 lb (74.8 kg)  11/01/23 165 lb (74.8 kg)    Physical Exam Vitals reviewed.  HENT:     Head: Normocephalic and atraumatic.  Eyes:     Pupils: Pupils are equal, round, and reactive to light.  Cardiovascular:     Rate and Rhythm: Normal rate and  regular rhythm.     Heart sounds: Normal heart sounds.  Pulmonary:     Effort: Pulmonary effort is normal.     Breath sounds: Normal breath sounds.  Abdominal:     General: Bowel sounds are normal.     Palpations: Abdomen is soft.  Musculoskeletal:        General: No tenderness or deformity. Normal range of motion.     Cervical back: Normal range of motion.  Lymphadenopathy:     Cervical: No cervical adenopathy.  Skin:    General: Skin is warm and dry.     Findings: No erythema or rash.  Neurological:     Mental Status: She is alert and oriented to person, place, and time.  Psychiatric:        Behavior: Behavior normal.        Thought Content: Thought content normal.        Judgment: Judgment normal.      Lab Results  Component Value Date   WBC 13.2 (H) 12/06/2023   HGB 13.3 12/06/2023   HCT 40.0 12/06/2023   MCV 98.0 12/06/2023   PLT 210 12/06/2023     Chemistry      Component Value Date/Time   NA 137 11/12/2023 0452   K 4.0 11/12/2023 0452   CL 110 11/12/2023 0452   CO2 20 (L) 11/12/2023 0452   BUN 10 11/12/2023 0452   CREATININE 0.74 11/12/2023 0452   CREATININE 1.05 (H) 11/01/2023 1330   CREATININE 0.99 09/29/2019 1135      Component Value Date/Time   CALCIUM  7.5 (L) 11/12/2023 0452   ALKPHOS 39 11/10/2023 0804   AST 21 11/10/2023 0804   AST 19 11/01/2023 1330   ALT 18 11/10/2023 0804   ALT 14 11/01/2023 1330   BILITOT 1.0 11/10/2023 0804   BILITOT 0.4 11/01/2023 1330      Impression and Plan: Teresa Lee is a very charming 63 year old white  female.  She does have multiple health issues.  We found that she does have hemochromatosis.  Will have to see what her iron studies look like.  She is worried about her immune system.  We will check her immunoglobulins when we see her back.  I told her that that hemochromatosis would not affect her immune system.  We will plan to see her back in 1 month.  Hopefully, we can then move her appointments out a little  bit longer.   Maude JONELLE Crease, MD 9/18/20258:53 AM

## 2024-01-14 ENCOUNTER — Encounter: Payer: Self-pay | Admitting: Hematology & Oncology

## 2024-01-14 ENCOUNTER — Inpatient Hospital Stay (HOSPITAL_BASED_OUTPATIENT_CLINIC_OR_DEPARTMENT_OTHER): Admitting: Hematology & Oncology

## 2024-01-14 ENCOUNTER — Inpatient Hospital Stay: Attending: Hematology & Oncology

## 2024-01-14 DIAGNOSIS — M1990 Unspecified osteoarthritis, unspecified site: Secondary | ICD-10-CM | POA: Diagnosis not present

## 2024-01-14 LAB — CBC WITH DIFFERENTIAL (CANCER CENTER ONLY)
Abs Immature Granulocytes: 0.5 K/uL — ABNORMAL HIGH (ref 0.00–0.07)
Basophils Absolute: 0 K/uL (ref 0.0–0.1)
Basophils Relative: 0 %
Eosinophils Absolute: 0 K/uL (ref 0.0–0.5)
Eosinophils Relative: 0 %
HCT: 41.7 % (ref 36.0–46.0)
Hemoglobin: 14 g/dL (ref 12.0–15.0)
Lymphocytes Relative: 8 %
Lymphs Abs: 1 K/uL (ref 0.7–4.0)
MCH: 31.9 pg (ref 26.0–34.0)
MCHC: 33.6 g/dL (ref 30.0–36.0)
MCV: 95 fL (ref 80.0–100.0)
Monocytes Absolute: 1.2 K/uL — ABNORMAL HIGH (ref 0.1–1.0)
Monocytes Relative: 9 %
Myelocytes: 4 %
Neutro Abs: 10.1 K/uL — ABNORMAL HIGH (ref 1.7–7.7)
Neutrophils Relative %: 79 %
Platelet Count: 225 K/uL (ref 150–400)
RBC: 4.39 MIL/uL (ref 3.87–5.11)
RDW: 14.8 % (ref 11.5–15.5)
WBC Count: 12.8 K/uL — ABNORMAL HIGH (ref 4.0–10.5)
nRBC: 0 % (ref 0.0–0.2)

## 2024-01-14 LAB — FERRITIN: Ferritin: 52 ng/mL (ref 11–307)

## 2024-01-14 LAB — CMP (CANCER CENTER ONLY)
ALT: 10 U/L (ref 0–44)
AST: 17 U/L (ref 15–41)
Albumin: 4.1 g/dL (ref 3.5–5.0)
Alkaline Phosphatase: 38 U/L (ref 38–126)
Anion gap: 15 (ref 5–15)
BUN: 23 mg/dL (ref 8–23)
CO2: 20 mmol/L — ABNORMAL LOW (ref 22–32)
Calcium: 9.7 mg/dL (ref 8.9–10.3)
Chloride: 103 mmol/L (ref 98–111)
Creatinine: 1.06 mg/dL — ABNORMAL HIGH (ref 0.44–1.00)
GFR, Estimated: 59 mL/min — ABNORMAL LOW (ref >60)
Glucose, Bld: 109 mg/dL — ABNORMAL HIGH (ref 70–99)
Potassium: 4.1 mmol/L (ref 3.5–5.1)
Sodium: 138 mmol/L (ref 135–145)
Total Bilirubin: 0.5 mg/dL (ref 0.0–1.2)
Total Protein: 7 g/dL (ref 6.5–8.1)

## 2024-01-14 LAB — IRON AND IRON BINDING CAPACITY (CC-WL,HP ONLY)
Iron: 132 ug/dL (ref 28–170)
Saturation Ratios: 26 % (ref 10.4–31.8)
TIBC: 507 ug/dL — ABNORMAL HIGH (ref 250–450)
UIBC: 375 ug/dL

## 2024-01-14 LAB — RETICULOCYTES
Immature Retic Fract: 16 % — ABNORMAL HIGH (ref 2.3–15.9)
RBC.: 4.36 MIL/uL (ref 3.87–5.11)
Retic Count, Absolute: 138.6 K/uL (ref 19.0–186.0)
Retic Ct Pct: 3.2 % — ABNORMAL HIGH (ref 0.4–3.1)

## 2024-01-14 NOTE — Progress Notes (Signed)
 Hematology and Oncology Follow Up Visit  Teresa Lee [id number] 11/04/1960 63 y.o. 01/14/2024   Principle Diagnosis:  Hereditary hemochromatosis ( double heterozygote - C282Y/H63D)  Current Therapy:   Phlebotomy for ferritin >100 or Iron saturation >30%     Interim History:  Teresa Lee is back for follow-up.  She is doing okay.  She is bothered by her arthritis.  This really is a problem for her.  I just feel bad about this.  When we last saw her, her ferritin was 137 with an iron saturation of 24%.  She is watching what she eats.  She is trying to be careful with avoiding COVID.  She has had no further issues with norovirus.  There is been no rashes.  She has had no bleeding.  She has had a lot of joint swelling and pain.  There is been no fever.  Overall, I would say that her performance status is probably ECOG 2.    Medications:  Current Outpatient Medications:    albuterol  (PROVENTIL ) (2.5 MG/3ML) 0.083% nebulizer solution, Take 2.5 mg by nebulization every 6 (six) hours as needed for shortness of breath or wheezing., Disp: , Rfl:    albuterol  (VENTOLIN  HFA) 108 (90 Base) MCG/ACT inhaler, 1 PUFF EVERY 6 HOURS AS NEEDED FOR WHEEZING, Disp: 18 g, Rfl: 2   cyanocobalamin  (,VITAMIN B-12,) 1000 MCG/ML injection, Inject 1,000 mcg into the muscle See admin instructions. Inject 1,000 mcg into the muscle every 6-8 weeks (Patient taking differently: Inject 1,000 mcg into the muscle See admin instructions. Inject 1,000 mcg into the muscle every 4 weeks), Disp: , Rfl:    desmopressin  (DDAVP ) 0.2 MG tablet, Take 0.2 mg by mouth at bedtime. (Patient taking differently: Take 0.1 mg by mouth at bedtime.), Disp: , Rfl:    estradiol  (ESTRACE ) 2 MG tablet, Take 2 mg by mouth at bedtime., Disp: , Rfl:    ezetimibe  (ZETIA ) 10 MG tablet, Take 10 mg by mouth daily., Disp: , Rfl:    glimepiride (AMARYL) 1 MG tablet, Take 1 mg by mouth daily with breakfast., Disp: , Rfl:     HYDROcodone -acetaminophen  (NORCO) 10-325 MG tablet, Take 1 tablet by mouth every 4 (four) hours., Disp: , Rfl:    ketoconazole  (NIZORAL ) 2 % cream, Apply 1 Application topically daily., Disp: , Rfl:    losartan  (COZAAR ) 100 MG tablet, Take 100 mg by mouth daily., Disp: , Rfl:    Mesalamine  (ASACOL ) 400 MG CPDR DR capsule, Take 800 mg by mouth in the morning and at bedtime., Disp: , Rfl:    methocarbamol  (ROBAXIN ) 500 MG tablet, Take 500-1,000 mg by mouth See admin instructions. Take 500-1,000 mg by mouth at bedtime and an additional 500 mg once a day as needed for muscle spasms, Disp: , Rfl:    metoprolol  succinate (TOPROL -XL) 25 MG 24 hr tablet, Take 25 mg by mouth daily., Disp: , Rfl:    mupirocin ointment (BACTROBAN) 2 %, Apply topically 2 (two) times daily., Disp: , Rfl:    mycophenolate  (CELLCEPT ) 500 MG tablet, Take 1,000 mg by mouth in the morning and at bedtime., Disp: , Rfl:    naloxone  (NARCAN ) nasal spray 4 mg/0.1 mL, Place 1 spray into the nose once as needed (opioid overdose)., Disp: , Rfl:    omega-3 acid ethyl esters (LOVAZA ) 1 g capsule, Take 1 g by mouth 4 (four) times daily., Disp: , Rfl:    ondansetron  (ZOFRAN -ODT) 8 MG disintegrating tablet, Take 8 mg by mouth every 8 (eight) hours  as needed for nausea or vomiting., Disp: , Rfl:    pantoprazole  (PROTONIX ) 40 MG tablet, TAKE 1 TABLET DAILY, Disp: 90 tablet, Rfl: 3   pioglitazone (ACTOS) 15 MG tablet, Take 15 mg by mouth daily., Disp: , Rfl:    potassium chloride  SA (KLOR-CON  M) 20 MEQ tablet, Take 20 mEq by mouth in the morning and at bedtime. (Patient taking differently: Take 20 mEq by mouth 2 (two) times daily.), Disp: , Rfl:    predniSONE  (DELTASONE ) 10 MG tablet, Prednisone  50 mg daily for 3 days followed by  Prednisone  40 mg daily for 5 days followed by  Prednisone  20 mg daily for 5 days followed by  Prednisone  10 mg daily, Disp: 45 tablet, Rfl: 0   pregabalin  (LYRICA ) 100 MG capsule, Take 100 mg by mouth at bedtime., Disp: ,  Rfl:    progesterone  (PROMETRIUM ) 100 MG capsule, Take 100 mg by mouth at bedtime., Disp: , Rfl:    pyridostigmine  (MESTINON ) 60 MG tablet, Take 30 mg by mouth 3 (three) times daily as needed (for Myasthenia gravis symptoms)., Disp: , Rfl:    rOPINIRole  (REQUIP ) 1 MG tablet, Take 1-2 tablets (1-2 mg total) by mouth at bedtime., Disp: 180 tablet, Rfl: 1   solifenacin (VESICARE) 10 MG tablet, Take 10 mg by mouth at bedtime., Disp: , Rfl:    Spacer/Aero-Holding Chambers (AEROCHAMBER PLUS) inhaler, Use as instructed to use with inahaler., Disp: 1 each, Rfl: 1   spironolactone  (ALDACTONE ) 25 MG tablet, Take 25 mg by mouth daily., Disp: , Rfl:    SYMBICORT  80-4.5 MCG/ACT inhaler, Inhale 2 puffs into the lungs 2 (two) times daily., Disp: 10.2 g, Rfl: 6   valACYclovir (VALTREX) 500 MG tablet, Take 500 mg by mouth daily as needed (fever blisters)., Disp: , Rfl:    Vitamin D , Ergocalciferol , (DRISDOL ) 1.25 MG (50000 UNIT) CAPS capsule, Take 50,000 Units by mouth every 14 (fourteen) days., Disp: , Rfl:    Blood Glucose Monitoring Suppl DEVI, 1 each by Does not apply route in the morning, at noon, and at bedtime. May substitute to any manufacturer covered by patient's insurance. (Patient not taking: Reported on 01/14/2024), Disp: 1 each, Rfl: 0  Allergies:  Allergies  Allergen Reactions   Rosuvastatin  Other (See Comments)    Rhabdomyolysis   Tapentadol Other (See Comments)    Headaches (Nucynta)   Cefixime Hives, Rash and Other (See Comments)    Due to myasthenia gravis   Citrus Dermatitis    Causes burning of skin and blisters   Diazepam Other (See Comments)    Hallucinations    Latex Other (See Comments)    Blisters    Lorazepam  Other (See Comments)    Visual hallucinations   Orange Concentrate [Flavoring Agent (Non-Screening)] Other (See Comments)    Acid reflux   Other Hives, Nausea Only, Dermatitis, Rash and Other (See Comments)    Some nuts cause RASHES, some are tolerated   Oxycodone  Hcl  Hives   Percocet [Oxycodone -Acetaminophen ] Hives   Statins Other (See Comments)    Statin induced myositis from rosuvastatin . DO NOT Attempt to try other statins.   Sulfa Antibiotics Hives and Nausea And Vomiting   Tape Other (See Comments)    Blisters and burns the skin   Bioflavonoids Rash    Causes burning of skin and blisters   Duloxetine Nausea Only    Cymbalta     Past Medical History, Surgical history, Social history, and Family History were reviewed and updated.  Review of Systems:  Review of Systems  Constitutional: Negative.   HENT:  Negative.    Eyes: Negative.   Respiratory: Negative.    Cardiovascular: Negative.   Gastrointestinal:  Positive for diarrhea and vomiting.  Endocrine: Negative.   Genitourinary: Negative.    Musculoskeletal:  Positive for arthralgias and myalgias.  Skin: Negative.   Neurological: Negative.   Hematological: Negative.   Psychiatric/Behavioral: Negative.      Physical Exam:  height is 5' (1.524 m) and weight is 162 lb 1.9 oz (73.5 kg). Her tympanic temperature is 98.6 F (37 C). Her blood pressure is 123/71 and her pulse is 78. Her respiration is 20 and oxygen saturation is 99%.   Wt Readings from Last 3 Encounters:  01/14/24 162 lb 1.9 oz (73.5 kg)  12/06/23 162 lb 1.9 oz (73.5 kg)  11/10/23 165 lb (74.8 kg)    Physical Exam Vitals reviewed.  HENT:     Head: Normocephalic and atraumatic.  Eyes:     Pupils: Pupils are equal, round, and reactive to light.  Cardiovascular:     Rate and Rhythm: Normal rate and regular rhythm.     Heart sounds: Normal heart sounds.  Pulmonary:     Effort: Pulmonary effort is normal.     Breath sounds: Normal breath sounds.  Abdominal:     General: Bowel sounds are normal.     Palpations: Abdomen is soft.  Musculoskeletal:        General: No tenderness or deformity. Normal range of motion.     Cervical back: Normal range of motion.  Lymphadenopathy:     Cervical: No cervical adenopathy.   Skin:    General: Skin is warm and dry.     Findings: No erythema or rash.  Neurological:     Mental Status: She is alert and oriented to person, place, and time.  Psychiatric:        Behavior: Behavior normal.        Thought Content: Thought content normal.        Judgment: Judgment normal.      Lab Results  Component Value Date   WBC 12.8 (H) 01/14/2024   HGB 14.0 01/14/2024   HCT 41.7 01/14/2024   MCV 95.0 01/14/2024   PLT 225 01/14/2024     Chemistry      Component Value Date/Time   NA 138 01/14/2024 1202   K 4.1 01/14/2024 1202   CL 103 01/14/2024 1202   CO2 20 (L) 01/14/2024 1202   BUN 23 01/14/2024 1202   CREATININE 1.06 (H) 01/14/2024 1202   CREATININE 0.99 09/29/2019 1135      Component Value Date/Time   CALCIUM  9.7 01/14/2024 1202   ALKPHOS 38 01/14/2024 1202   AST 17 01/14/2024 1202   ALT 10 01/14/2024 1202   BILITOT 0.5 01/14/2024 1202      Impression and Plan: Ms. Harmon is a very charming 63 year old white female.  She does have multiple health issues.  We found that she does have hemochromatosis.  She is a double heterozygote.  I told her that she could always donate blood to the [facility].  I think this would certainly be reasonable for her.  I know that she is somewhat reluctant to do this as she is afraid this may affect her health otherwise.  We will go ahead and check her immunoglobulin levels.  I will be interesting to see what they look like.  We will plan to get her back to see us  probably in another  3 months or so.     Maude JONELLE Crease, MD 10/27/20251:03 PM

## 2024-01-15 ENCOUNTER — Ambulatory Visit: Payer: Self-pay | Admitting: Hematology & Oncology

## 2024-01-15 LAB — IGG, IGA, IGM
IgA: 112 mg/dL (ref 87–352)
IgG (Immunoglobin G), Serum: 826 mg/dL (ref 586–1602)
IgM (Immunoglobulin M), Srm: 77 mg/dL (ref 26–217)

## 2024-01-15 NOTE — Telephone Encounter (Signed)
-----   Message from Maude JONELLE Crease sent at 01/15/2024  6:46 AM EDT ----- Please call let her know that her iron levels are nice and low.  This is perfect for her hemochromatosis.  Thanks.  Jeralyn ----- Message ----- From: Rebecka, [facility] [place] Sent: 01/14/2024  12:21 PM EDT To: Maude JONELLE Crease, MD

## 2024-01-15 NOTE — Telephone Encounter (Signed)
 Advised via MyChart.

## 2024-04-02 ENCOUNTER — Other Ambulatory Visit: Payer: Self-pay | Admitting: Nurse Practitioner

## 2024-04-02 DIAGNOSIS — M5416 Radiculopathy, lumbar region: Secondary | ICD-10-CM

## 2024-04-08 ENCOUNTER — Encounter: Payer: Self-pay | Admitting: Nurse Practitioner

## 2024-04-10 ENCOUNTER — Ambulatory Visit
Admission: RE | Admit: 2024-04-10 | Discharge: 2024-04-10 | Disposition: A | Discharge status: Home / Self Care | Source: Ambulatory Visit | Attending: Nurse Practitioner | Admitting: Nurse Practitioner

## 2024-04-10 DIAGNOSIS — M5416 Radiculopathy, lumbar region: Secondary | ICD-10-CM

## 2024-04-18 ENCOUNTER — Inpatient Hospital Stay: Admitting: Family

## 2024-04-18 ENCOUNTER — Inpatient Hospital Stay: Attending: Hematology & Oncology

## 2024-04-18 DIAGNOSIS — M6281 Muscle weakness (generalized): Secondary | ICD-10-CM | POA: Insufficient documentation

## 2024-04-18 DIAGNOSIS — G629 Polyneuropathy, unspecified: Secondary | ICD-10-CM | POA: Diagnosis not present

## 2024-04-18 DIAGNOSIS — M545 Low back pain, unspecified: Secondary | ICD-10-CM | POA: Diagnosis not present

## 2024-04-18 DIAGNOSIS — R5383 Other fatigue: Secondary | ICD-10-CM | POA: Insufficient documentation

## 2024-04-18 DIAGNOSIS — G8929 Other chronic pain: Secondary | ICD-10-CM | POA: Diagnosis not present

## 2024-04-18 LAB — CBC WITH DIFFERENTIAL (CANCER CENTER ONLY)
Abs Immature Granulocytes: 0.95 10*3/uL — ABNORMAL HIGH (ref 0.00–0.07)
Basophils Absolute: 0.1 10*3/uL (ref 0.0–0.1)
Basophils Relative: 1 %
Eosinophils Absolute: 0.5 10*3/uL (ref 0.0–0.5)
Eosinophils Relative: 3 %
HCT: 40.5 % (ref 36.0–46.0)
Hemoglobin: 13.9 g/dL (ref 12.0–15.0)
Immature Granulocytes: 7 %
Lymphocytes Relative: 17 %
Lymphs Abs: 2.3 10*3/uL (ref 0.7–4.0)
MCH: 33.5 pg (ref 26.0–34.0)
MCHC: 34.3 g/dL (ref 30.0–36.0)
MCV: 97.6 fL (ref 80.0–100.0)
Monocytes Absolute: 1.2 10*3/uL — ABNORMAL HIGH (ref 0.1–1.0)
Monocytes Relative: 9 %
Neutro Abs: 8.6 10*3/uL — ABNORMAL HIGH (ref 1.7–7.7)
Neutrophils Relative %: 63 %
Platelet Count: 243 10*3/uL (ref 150–400)
RBC: 4.15 MIL/uL (ref 3.87–5.11)
RDW: 14.7 % (ref 11.5–15.5)
WBC Count: 13.3 10*3/uL — ABNORMAL HIGH (ref 4.0–10.5)
nRBC: 0 % (ref 0.0–0.2)

## 2024-04-18 LAB — CMP (CANCER CENTER ONLY)
ALT: 22 U/L (ref 0–44)
AST: 26 U/L (ref 15–41)
Albumin: 4.2 g/dL (ref 3.5–5.0)
Alkaline Phosphatase: 36 U/L — ABNORMAL LOW (ref 38–126)
Anion gap: 14 (ref 5–15)
BUN: 25 mg/dL — ABNORMAL HIGH (ref 8–23)
CO2: 21 mmol/L — ABNORMAL LOW (ref 22–32)
Calcium: 9.4 mg/dL (ref 8.9–10.3)
Chloride: 105 mmol/L (ref 98–111)
Creatinine: 1.1 mg/dL — ABNORMAL HIGH (ref 0.44–1.00)
GFR, Estimated: 57 mL/min — ABNORMAL LOW
Glucose, Bld: 141 mg/dL — ABNORMAL HIGH (ref 70–99)
Potassium: 3.9 mmol/L (ref 3.5–5.1)
Sodium: 140 mmol/L (ref 135–145)
Total Bilirubin: 0.4 mg/dL (ref 0.0–1.2)
Total Protein: 6.7 g/dL (ref 6.5–8.1)

## 2024-04-18 LAB — IRON AND IRON BINDING CAPACITY (CC-WL,HP ONLY)
Iron: 133 ug/dL (ref 28–170)
Saturation Ratios: 25 % (ref 10.4–31.8)
TIBC: 535 ug/dL — ABNORMAL HIGH (ref 250–450)
UIBC: 402 ug/dL

## 2024-04-18 LAB — FERRITIN: Ferritin: 41 ng/mL (ref 11–307)

## 2024-04-18 NOTE — Progress Notes (Signed)
 " Hematology and Oncology Follow Up Visit  Teresa Lee [id number] 07-25-1960 64 y.o. 04/18/2024   Principle Diagnosis:  Hereditary hemochromatosis ( double heterozygote - C282Y/H63D)   Current Therapy:        Phlebotomy for ferritin >100 or Iron saturation >30%   Interim History:  Ms. Teresa Lee is here today for follow-up. She has chronic pain in her lower back, hips and joints. This leads to fatigue and muscle weakness. She notes this also in her inner thighs.  No falls or syncope reported.  No swelling noted. She has neuropathy in her hands and feet unchanged from baseline.   She states that her pain medication causes itching.  No fever, chills, n/v, cough, dizziness, SOB, chest pain, palpitations, abdominal pain or changes in bowel or bladder habits.  Appetite and hydration are good. Weight is stable at 162 lbs.   ECOG Performance Status: 1 - Symptomatic but completely ambulatory  Medications:  Allergies as of 04/18/2024       Reactions   Rosuvastatin  Other (See Comments)   Rhabdomyolysis   Tapentadol Other (See Comments)   Headaches (Nucynta)   Cefixime Hives, Rash, Other (See Comments)   Due to myasthenia gravis   Citrus Dermatitis   Causes burning of skin and blisters   Diazepam Other (See Comments)   Hallucinations   Latex Other (See Comments)   Blisters    Lorazepam  Other (See Comments)   Visual hallucinations   Orange Concentrate [employer] (non-screening)] Other (See Comments)   Acid reflux   Other Hives, Nausea Only, Dermatitis, Rash, Other (See Comments)   Some nuts cause RASHES, some are tolerated   Oxycodone  Hcl Hives   Percocet [oxycodone -acetaminophen ] Hives   Statins Other (See Comments)   Statin induced myositis from rosuvastatin . DO NOT Attempt to try other statins.   Sulfa Antibiotics Hives, Nausea And Vomiting   Tape Other (See Comments)   Blisters and burns the skin   Bioflavonoids Rash   Causes burning of skin and blisters    Duloxetine Nausea Only   Cymbalta         Medication List        Accurate as of April 18, 2024 10:16 AM. If you have any questions, ask your nurse or doctor.          rOPINIRole  1 MG tablet Commonly known as: REQUIP  Take 1-2 tablets (1-2 mg total) by mouth at bedtime. The timing of this medication is very important.   AeroChamber Plus inhaler Use as instructed to use with inahaler.   albuterol  108 (90 Base) MCG/ACT inhaler Commonly known as: VENTOLIN  HFA 1 PUFF EVERY 6 HOURS AS NEEDED FOR WHEEZING   albuterol  (2.5 MG/3ML) 0.083% nebulizer solution Commonly known as: PROVENTIL  Take 2.5 mg by nebulization every 6 (six) hours as needed for shortness of breath or wheezing.   Blood Glucose Monitoring Suppl Devi 1 each by Does not apply route in the morning, at noon, and at bedtime. May substitute to any manufacturer covered by patient's insurance.   cyanocobalamin  1000 MCG/ML injection Commonly known as: VITAMIN B12 Inject 1,000 mcg into the muscle See admin instructions. Inject 1,000 mcg into the muscle every 6-8 weeks What changed: additional instructions   desmopressin  0.2 MG tablet Commonly known as: DDAVP  Take 0.2 mg by mouth at bedtime. What changed: how much to take   estradiol  2 MG tablet Commonly known as: ESTRACE  Take 2 mg by mouth at bedtime.   ezetimibe  10 MG tablet Commonly known as: ZETIA   Take 10 mg by mouth daily.   glimepiride 1 MG tablet Commonly known as: AMARYL Take 1 mg by mouth daily with breakfast.   HYDROcodone -acetaminophen  10-325 MG tablet Commonly known as: NORCO Take 1 tablet by mouth every 4 (four) hours.   ketoconazole  2 % cream Commonly known as: NIZORAL  Apply 1 Application topically daily.   losartan  100 MG tablet Commonly known as: COZAAR  Take 100 mg by mouth daily.   Mesalamine  400 MG Cpdr DR capsule Commonly known as: ASACOL  Take 800 mg by mouth in the morning and at bedtime.   methocarbamol  500 MG  tablet Commonly known as: ROBAXIN  Take 500-1,000 mg by mouth See admin instructions. Take 500-1,000 mg by mouth at bedtime and an additional 500 mg once a day as needed for muscle spasms   metoprolol  succinate 25 MG 24 hr tablet Commonly known as: TOPROL -XL Take 25 mg by mouth daily.   mupirocin ointment 2 % Commonly known as: BACTROBAN Apply topically 2 (two) times daily.   mycophenolate  500 MG tablet Commonly known as: CELLCEPT  Take 1,000 mg by mouth in the morning and at bedtime.   naloxone  4 MG/0.1ML Liqd nasal spray kit Commonly known as: NARCAN  Place 1 spray into the nose once as needed (opioid overdose).   omega-3 acid ethyl esters 1 g capsule Commonly known as: LOVAZA  Take 1 g by mouth 4 (four) times daily.   ondansetron  8 MG disintegrating tablet Commonly known as: ZOFRAN -ODT Take 8 mg by mouth every 8 (eight) hours as needed for nausea or vomiting.   pantoprazole  40 MG tablet Commonly known as: PROTONIX  TAKE 1 TABLET DAILY   pioglitazone 15 MG tablet Commonly known as: ACTOS Take 15 mg by mouth daily.   potassium chloride  SA 20 MEQ tablet Commonly known as: KLOR-CON  M Take 20 mEq by mouth in the morning and at bedtime. What changed: when to take this   predniSONE  10 MG tablet Commonly known as: DELTASONE  Prednisone  50 mg daily for 3 days followed by  Prednisone  40 mg daily for 5 days followed by  Prednisone  20 mg daily for 5 days followed by  Prednisone  10 mg daily   pregabalin  100 MG capsule Commonly known as: LYRICA  Take 100 mg by mouth at bedtime.   progesterone  100 MG capsule Commonly known as: PROMETRIUM  Take 100 mg by mouth at bedtime.   pyridostigmine  60 MG tablet Commonly known as: MESTINON  Take 30 mg by mouth 3 (three) times daily as needed (for Myasthenia gravis symptoms).   solifenacin 10 MG tablet Commonly known as: VESICARE Take 10 mg by mouth at bedtime.   spironolactone  25 MG tablet Commonly known as: ALDACTONE  Take 25 mg by  mouth daily.   Symbicort  80-4.5 MCG/ACT inhaler Generic drug: budesonide -formoterol  Inhale 2 puffs into the lungs 2 (two) times daily.   valACYclovir 500 MG tablet Commonly known as: VALTREX Take 500 mg by mouth daily as needed (fever blisters).   Vitamin D  (Ergocalciferol ) 1.25 MG (50000 UNIT) Caps capsule Commonly known as: DRISDOL  Take 50,000 Units by mouth every 14 (fourteen) days.        Allergies: Allergies[1]  Past Medical History, Surgical history, Social history, and Family History were reviewed and updated.  Review of Systems: All other 10 point review of systems is negative.   Physical Exam:  height is 5' (1.524 m) and weight is 162 lb 6.4 oz (73.7 kg).   Wt Readings from Last 3 Encounters:  04/18/24 162 lb 6.4 oz (73.7 kg)  01/14/24 162 lb  1.9 oz (73.5 kg)  12/06/23 162 lb 1.9 oz (73.5 kg)    Ocular: Sclerae unicteric, pupils equal, round and reactive to light Ear-nose-throat: Oropharynx clear, dentition fair Lymphatic: No cervical or supraclavicular adenopathy Lungs no rales or rhonchi, good excursion bilaterally Heart regular rate and rhythm, no murmur appreciated Abd soft, nontender, positive bowel sounds MSK no focal spinal tenderness, no joint edema Neuro: non-focal, well-oriented, appropriate affect Breasts: Deferred   Lab Results  Component Value Date   WBC 13.3 (H) 04/18/2024   HGB 13.9 04/18/2024   HCT 40.5 04/18/2024   MCV 97.6 04/18/2024   PLT 243 04/18/2024   Lab Results  Component Value Date   FERRITIN 52 01/14/2024   IRON 132 01/14/2024   TIBC 507 (H) 01/14/2024   UIBC 375 01/14/2024   IRONPCTSAT 26 01/14/2024   Lab Results  Component Value Date   RETICCTPCT 3.2 (H) 01/14/2024   RBC 4.15 04/18/2024   No results found for: JONATHAN BONG [facility] Lab Results  Component Value Date   IGGSERUM 826 01/14/2024   IGA 112 01/14/2024   IGMSERUM 77 01/14/2024   No results found for: STEPHANY CARLOTA BENSON MARKEL EARLA JOANNIE DOC VICK, SPEI   Chemistry      Component Value Date/Time   NA 138 01/14/2024 1202   K 4.1 01/14/2024 1202   CL 103 01/14/2024 1202   CO2 20 (L) 01/14/2024 1202   BUN 23 01/14/2024 1202   CREATININE 1.06 (H) 01/14/2024 1202   CREATININE 0.99 09/29/2019 1135      Component Value Date/Time   CALCIUM  9.7 01/14/2024 1202   ALKPHOS 38 01/14/2024 1202   AST 17 01/14/2024 1202   ALT 10 01/14/2024 1202   BILITOT 0.5 01/14/2024 1202       Impression and Plan: Ms. Bucklin is a very charming 64 yo caucasian female with multiple health issues including hemochromatosis, double heterozygote.  Her iron studies have remained stable recently.  Today's results are pending. We will consider phlebotomy here if needed.  Patient has been hesitant in the past due to how phlebotomy may effect her overall health.  We can consider partial phlebotomy and replacement fluids if needed in the future.  Follow-up in 3 months.   Lauraine Pepper, NP 1/30/202610:16 AM     [1]  Allergies Allergen Reactions   Rosuvastatin  Other (See Comments)    Rhabdomyolysis   Tapentadol Other (See Comments)    Headaches (Nucynta)   Cefixime Hives, Rash and Other (See Comments)    Due to myasthenia gravis   Citrus Dermatitis    Causes burning of skin and blisters   Diazepam Other (See Comments)    Hallucinations    Latex Other (See Comments)    Blisters    Lorazepam  Other (See Comments)    Visual hallucinations   Orange Concentrate [Flavoring Agent (Non-Screening)] Other (See Comments)    Acid reflux   Other Hives, Nausea Only, Dermatitis, Rash and Other (See Comments)    Some nuts cause RASHES, some are tolerated   Oxycodone  Hcl Hives   Percocet [Oxycodone -Acetaminophen ] Hives   Statins Other (See Comments)    Statin induced myositis from rosuvastatin . DO NOT Attempt to try other statins.   Sulfa Antibiotics Hives and Nausea And Vomiting   Tape Other  (See Comments)    Blisters and burns the skin   Bioflavonoids Rash    Causes burning of skin and blisters   Duloxetine Nausea Only    Cymbalta    "

## 2024-04-19 ENCOUNTER — Ambulatory Visit: Payer: Self-pay | Admitting: Hematology & Oncology

## 2024-07-18 ENCOUNTER — Inpatient Hospital Stay: Admitting: Family

## 2024-07-18 ENCOUNTER — Inpatient Hospital Stay
